# Patient Record
Sex: Female | Born: 1955 | Race: White | Hispanic: No | Marital: Married | Smoking: Former smoker
Health system: Southern US, Community
[De-identification: ages and names within clinical notes are randomized; demographics above are authoritative.]

## PROBLEM LIST (undated history)

## (undated) DIAGNOSIS — I10 Essential (primary) hypertension: Secondary | ICD-10-CM

## (undated) DIAGNOSIS — Z789 Other specified health status: Secondary | ICD-10-CM

## (undated) DIAGNOSIS — J40 Bronchitis, not specified as acute or chronic: Secondary | ICD-10-CM

## (undated) DIAGNOSIS — G629 Polyneuropathy, unspecified: Secondary | ICD-10-CM

## (undated) DIAGNOSIS — I5021 Acute systolic (congestive) heart failure: Secondary | ICD-10-CM

## (undated) DIAGNOSIS — K219 Gastro-esophageal reflux disease without esophagitis: Secondary | ICD-10-CM

## (undated) DIAGNOSIS — J45909 Unspecified asthma, uncomplicated: Secondary | ICD-10-CM

## (undated) DIAGNOSIS — I252 Old myocardial infarction: Secondary | ICD-10-CM

## (undated) DIAGNOSIS — I219 Acute myocardial infarction, unspecified: Secondary | ICD-10-CM

## (undated) DIAGNOSIS — I5022 Chronic systolic (congestive) heart failure: Secondary | ICD-10-CM

## (undated) DIAGNOSIS — C55 Malignant neoplasm of uterus, part unspecified: Secondary | ICD-10-CM

## (undated) DIAGNOSIS — T7840XA Allergy, unspecified, initial encounter: Secondary | ICD-10-CM

## (undated) DIAGNOSIS — M25512 Pain in left shoulder: Secondary | ICD-10-CM

## (undated) DIAGNOSIS — E669 Obesity, unspecified: Secondary | ICD-10-CM

## (undated) DIAGNOSIS — D472 Monoclonal gammopathy: Secondary | ICD-10-CM

## (undated) DIAGNOSIS — E785 Hyperlipidemia, unspecified: Secondary | ICD-10-CM

## (undated) DIAGNOSIS — I340 Nonrheumatic mitral (valve) insufficiency: Secondary | ICD-10-CM

## (undated) DIAGNOSIS — Z923 Personal history of irradiation: Secondary | ICD-10-CM

## (undated) DIAGNOSIS — R030 Elevated blood-pressure reading, without diagnosis of hypertension: Secondary | ICD-10-CM

## (undated) DIAGNOSIS — G4730 Sleep apnea, unspecified: Secondary | ICD-10-CM

## (undated) DIAGNOSIS — I2510 Atherosclerotic heart disease of native coronary artery without angina pectoris: Secondary | ICD-10-CM

## (undated) DIAGNOSIS — E039 Hypothyroidism, unspecified: Secondary | ICD-10-CM

## (undated) DIAGNOSIS — M25511 Pain in right shoulder: Secondary | ICD-10-CM

## (undated) HISTORY — DX: Obesity, unspecified: E66.9

## (undated) HISTORY — DX: Sleep apnea, unspecified: G47.30

## (undated) HISTORY — DX: Nonrheumatic mitral (valve) insufficiency: I34.0

## (undated) HISTORY — DX: Elevated blood-pressure reading, without diagnosis of hypertension: R03.0

## (undated) HISTORY — DX: Chronic systolic (congestive) heart failure: I50.22

## (undated) HISTORY — DX: Personal history of irradiation: Z92.3

## (undated) HISTORY — DX: Other specified health status: Z78.9

## (undated) HISTORY — DX: Acute systolic (congestive) heart failure: I50.21

## (undated) HISTORY — DX: Pain in right shoulder: M25.511

## (undated) HISTORY — DX: Pain in left shoulder: M25.512

## (undated) HISTORY — PX: TUBAL LIGATION: SHX77

## (undated) HISTORY — DX: Old myocardial infarction: I25.2

## (undated) HISTORY — DX: Atherosclerotic heart disease of native coronary artery without angina pectoris: I25.10

## (undated) HISTORY — DX: Unspecified asthma, uncomplicated: J45.909

## (undated) HISTORY — DX: Polyneuropathy, unspecified: G62.9

## (undated) HISTORY — DX: Hyperlipidemia, unspecified: E78.5

## (undated) HISTORY — DX: Hypothyroidism, unspecified: E03.9

## (undated) HISTORY — DX: Monoclonal gammopathy: D47.2

## (undated) HISTORY — DX: Essential (primary) hypertension: I10

## (undated) HISTORY — DX: Allergy, unspecified, initial encounter: T78.40XA

---

## 2000-11-27 ENCOUNTER — Encounter: Admission: RE | Admit: 2000-11-27 | Discharge: 2000-11-27 | Payer: Self-pay

## 2000-11-27 ENCOUNTER — Encounter: Payer: Self-pay | Admitting: Family Medicine

## 2001-11-13 ENCOUNTER — Emergency Department (HOSPITAL_COMMUNITY): Admission: EM | Admit: 2001-11-13 | Discharge: 2001-11-13 | Payer: Self-pay | Admitting: Emergency Medicine

## 2001-12-02 ENCOUNTER — Encounter: Admission: RE | Admit: 2001-12-02 | Discharge: 2001-12-02 | Payer: Self-pay | Admitting: Family Medicine

## 2002-06-22 ENCOUNTER — Emergency Department (HOSPITAL_COMMUNITY): Admission: EM | Admit: 2002-06-22 | Discharge: 2002-06-22 | Payer: Self-pay | Admitting: Emergency Medicine

## 2003-03-09 ENCOUNTER — Ambulatory Visit (HOSPITAL_COMMUNITY): Admission: RE | Admit: 2003-03-09 | Discharge: 2003-03-09 | Payer: Self-pay | Admitting: Family Medicine

## 2003-03-09 ENCOUNTER — Encounter: Payer: Self-pay | Admitting: Family Medicine

## 2004-02-23 ENCOUNTER — Encounter: Admission: RE | Admit: 2004-02-23 | Discharge: 2004-02-23 | Payer: Self-pay | Admitting: General Surgery

## 2004-08-16 ENCOUNTER — Inpatient Hospital Stay (HOSPITAL_COMMUNITY): Admission: EM | Admit: 2004-08-16 | Discharge: 2004-08-20 | Payer: Self-pay | Admitting: Emergency Medicine

## 2004-08-16 ENCOUNTER — Ambulatory Visit: Payer: Self-pay | Admitting: Orthopedic Surgery

## 2004-09-14 ENCOUNTER — Ambulatory Visit: Payer: Self-pay | Admitting: Orthopedic Surgery

## 2004-10-05 ENCOUNTER — Ambulatory Visit: Payer: Self-pay | Admitting: Orthopedic Surgery

## 2005-06-20 ENCOUNTER — Encounter: Admission: RE | Admit: 2005-06-20 | Discharge: 2005-06-20 | Payer: Self-pay | Admitting: General Surgery

## 2005-08-25 ENCOUNTER — Encounter: Payer: Self-pay | Admitting: Internal Medicine

## 2005-08-25 LAB — CONVERTED CEMR LAB

## 2005-10-04 ENCOUNTER — Ambulatory Visit (HOSPITAL_COMMUNITY): Admission: RE | Admit: 2005-10-04 | Discharge: 2005-10-04 | Payer: Self-pay | Admitting: Family Medicine

## 2005-10-24 ENCOUNTER — Encounter: Admission: RE | Admit: 2005-10-24 | Discharge: 2005-10-24 | Payer: Self-pay | Admitting: Family Medicine

## 2006-03-04 ENCOUNTER — Emergency Department (HOSPITAL_COMMUNITY): Admission: EM | Admit: 2006-03-04 | Discharge: 2006-03-04 | Payer: Self-pay | Admitting: Family Medicine

## 2006-05-18 ENCOUNTER — Encounter: Admission: RE | Admit: 2006-05-18 | Discharge: 2006-05-18 | Payer: Self-pay | Admitting: Family Medicine

## 2006-07-11 ENCOUNTER — Emergency Department (HOSPITAL_COMMUNITY): Admission: EM | Admit: 2006-07-11 | Discharge: 2006-07-11 | Payer: Self-pay | Admitting: Family Medicine

## 2006-08-15 ENCOUNTER — Emergency Department (HOSPITAL_COMMUNITY): Admission: EM | Admit: 2006-08-15 | Discharge: 2006-08-15 | Payer: Self-pay | Admitting: Family Medicine

## 2006-10-04 ENCOUNTER — Ambulatory Visit: Payer: Self-pay | Admitting: Internal Medicine

## 2006-10-23 ENCOUNTER — Emergency Department (HOSPITAL_COMMUNITY): Admission: EM | Admit: 2006-10-23 | Discharge: 2006-10-23 | Payer: Self-pay | Admitting: Family Medicine

## 2006-10-30 ENCOUNTER — Encounter: Payer: Self-pay | Admitting: Internal Medicine

## 2006-10-30 LAB — CONVERTED CEMR LAB
ALT: 26 units/L (ref 0–53)
AST: 19 units/L (ref 0–37)
BUN: 13 mg/dL (ref 6–23)
Basophils Absolute: 0 10*3/uL (ref 0.0–0.1)
Basophils Relative: 0 % (ref 0–1)
CO2: 22 meq/L (ref 19–32)
Calcium: 9 mg/dL (ref 8.4–10.5)
Chloride: 98 meq/L (ref 96–112)
Cholesterol: 237 mg/dL — ABNORMAL HIGH (ref 0–200)
Creatinine, Ser: 0.64 mg/dL (ref 0.40–1.50)
Eosinophils Absolute: 0.4 10*3/uL (ref 0.0–0.7)
Eosinophils Relative: 5 % (ref 0–5)
Glucose, Bld: 231 mg/dL — ABNORMAL HIGH (ref 70–99)
HCT: 45.2 % (ref 39.0–52.0)
HDL: 48 mg/dL (ref >39)
Hemoglobin: 14 g/dL (ref 13.0–17.0)
Hgb A1c MFr Bld: 10.5 % — ABNORMAL HIGH (ref 4.6–6.1)
LDL Cholesterol: 139 mg/dL — ABNORMAL HIGH (ref 0–99)
Lymphocytes Relative: 37 % (ref 12–46)
Lymphs Abs: 3 10*3/uL (ref 0.7–3.3)
MCHC: 31 g/dL (ref 30.0–36.0)
MCV: 88.3 fL (ref 78.0–100.0)
Microalb, Ur: 0.7 mg/dL (ref 0.00–1.89)
Monocytes Absolute: 0.5 10*3/uL (ref 0.2–0.7)
Monocytes Relative: 6 % (ref 3–11)
Neutro Abs: 4.2 10*3/uL (ref 1.7–7.7)
Neutrophils Relative %: 52 % (ref 43–77)
Platelets: 338 10*3/uL (ref 150–400)
Potassium: 5.2 meq/L (ref 3.5–5.3)
RBC: 5.12 M/uL (ref 4.22–5.81)
RDW: 14.1 % — ABNORMAL HIGH (ref 11.5–14.0)
Sodium: 136 meq/L (ref 135–145)
TSH: 2.104 microintl units/mL (ref 0.350–5.50)
Total CHOL/HDL Ratio: 4.9
Triglycerides: 248 mg/dL — ABNORMAL HIGH (ref <150)
VLDL: 50 mg/dL — ABNORMAL HIGH (ref 0–40)
WBC: 8 10*3/uL (ref 4.0–10.5)

## 2006-11-01 ENCOUNTER — Ambulatory Visit: Payer: Self-pay | Admitting: Internal Medicine

## 2006-12-07 ENCOUNTER — Ambulatory Visit: Payer: Self-pay | Admitting: Internal Medicine

## 2007-01-15 ENCOUNTER — Ambulatory Visit: Payer: Self-pay | Admitting: Internal Medicine

## 2007-03-26 ENCOUNTER — Encounter: Payer: Self-pay | Admitting: Internal Medicine

## 2007-03-26 DIAGNOSIS — I10 Essential (primary) hypertension: Secondary | ICD-10-CM | POA: Insufficient documentation

## 2007-03-26 DIAGNOSIS — I152 Hypertension secondary to endocrine disorders: Secondary | ICD-10-CM | POA: Insufficient documentation

## 2007-03-26 DIAGNOSIS — E1165 Type 2 diabetes mellitus with hyperglycemia: Secondary | ICD-10-CM

## 2007-03-26 DIAGNOSIS — E66813 Obesity, class 3: Secondary | ICD-10-CM | POA: Insufficient documentation

## 2007-03-26 DIAGNOSIS — E1159 Type 2 diabetes mellitus with other circulatory complications: Secondary | ICD-10-CM | POA: Insufficient documentation

## 2007-03-26 DIAGNOSIS — E785 Hyperlipidemia, unspecified: Secondary | ICD-10-CM

## 2007-03-26 DIAGNOSIS — E1169 Type 2 diabetes mellitus with other specified complication: Secondary | ICD-10-CM | POA: Insufficient documentation

## 2007-10-17 ENCOUNTER — Ambulatory Visit (HOSPITAL_COMMUNITY): Admission: RE | Admit: 2007-10-17 | Discharge: 2007-10-17 | Payer: Self-pay | Admitting: Gastroenterology

## 2007-11-15 ENCOUNTER — Emergency Department (HOSPITAL_COMMUNITY): Admission: EM | Admit: 2007-11-15 | Discharge: 2007-11-15 | Payer: Self-pay | Admitting: Emergency Medicine

## 2008-01-04 ENCOUNTER — Emergency Department (HOSPITAL_COMMUNITY): Admission: EM | Admit: 2008-01-04 | Discharge: 2008-01-04 | Payer: Self-pay | Admitting: Emergency Medicine

## 2008-05-25 ENCOUNTER — Emergency Department (HOSPITAL_COMMUNITY): Admission: EM | Admit: 2008-05-25 | Discharge: 2008-05-25 | Payer: Self-pay | Admitting: Family Medicine

## 2008-09-23 ENCOUNTER — Encounter: Admission: RE | Admit: 2008-09-23 | Discharge: 2008-09-23 | Payer: Self-pay | Admitting: Family Medicine

## 2008-09-28 ENCOUNTER — Ambulatory Visit (HOSPITAL_COMMUNITY): Admission: RE | Admit: 2008-09-28 | Discharge: 2008-09-28 | Payer: Self-pay | Admitting: Emergency Medicine

## 2008-09-28 ENCOUNTER — Emergency Department (HOSPITAL_COMMUNITY): Admission: EM | Admit: 2008-09-28 | Discharge: 2008-09-28 | Payer: Self-pay | Admitting: Emergency Medicine

## 2008-09-28 ENCOUNTER — Encounter (INDEPENDENT_AMBULATORY_CARE_PROVIDER_SITE_OTHER): Payer: Self-pay | Admitting: Emergency Medicine

## 2008-09-28 ENCOUNTER — Ambulatory Visit: Payer: Self-pay | Admitting: Vascular Surgery

## 2008-11-13 ENCOUNTER — Ambulatory Visit: Payer: Self-pay | Admitting: Interventional Radiology

## 2008-11-13 ENCOUNTER — Emergency Department (HOSPITAL_BASED_OUTPATIENT_CLINIC_OR_DEPARTMENT_OTHER): Admission: EM | Admit: 2008-11-13 | Discharge: 2008-11-13 | Payer: Self-pay | Admitting: Emergency Medicine

## 2008-11-23 ENCOUNTER — Encounter: Payer: Self-pay | Admitting: Family Medicine

## 2008-11-23 ENCOUNTER — Ambulatory Visit (HOSPITAL_COMMUNITY): Admission: RE | Admit: 2008-11-23 | Discharge: 2008-11-23 | Payer: Self-pay | Admitting: Family Medicine

## 2009-02-01 ENCOUNTER — Emergency Department (HOSPITAL_COMMUNITY): Admission: EM | Admit: 2009-02-01 | Discharge: 2009-02-01 | Payer: Self-pay | Admitting: Family Medicine

## 2009-10-06 ENCOUNTER — Encounter: Admission: RE | Admit: 2009-10-06 | Discharge: 2009-10-06 | Payer: Self-pay | Admitting: Family Medicine

## 2010-02-08 ENCOUNTER — Emergency Department (HOSPITAL_COMMUNITY): Admission: EM | Admit: 2010-02-08 | Discharge: 2010-02-08 | Payer: Self-pay | Admitting: Emergency Medicine

## 2010-02-08 ENCOUNTER — Emergency Department (HOSPITAL_COMMUNITY): Admission: EM | Admit: 2010-02-08 | Discharge: 2010-02-09 | Payer: Self-pay | Admitting: Emergency Medicine

## 2010-06-21 ENCOUNTER — Ambulatory Visit: Payer: Self-pay | Admitting: Family Medicine

## 2010-09-18 ENCOUNTER — Encounter: Payer: Self-pay | Admitting: General Surgery

## 2010-11-13 LAB — PROCALCITONIN: Procalcitonin: <0.5 ng/mL

## 2010-11-13 LAB — BASIC METABOLIC PANEL
BUN: 11 mg/dL (ref 6–23)
CO2: 24 mEq/L (ref 19–32)
Calcium: 9.5 mg/dL (ref 8.4–10.5)
Chloride: 99 mEq/L (ref 96–112)
Creatinine, Ser: 0.78 mg/dL (ref 0.4–1.2)
GFR calc Af Amer: >60 mL/min (ref >60)
GFR calc non Af Amer: >60 mL/min (ref >60)
Glucose, Bld: 284 mg/dL — ABNORMAL HIGH (ref 70–99)
Potassium: 4.3 mEq/L (ref 3.5–5.1)
Sodium: 134 mEq/L — ABNORMAL LOW (ref 135–145)

## 2010-11-13 LAB — LACTIC ACID, PLASMA: Lactic Acid, Venous: 2.9 mmol/L — ABNORMAL HIGH (ref 0.5–2.2)

## 2010-11-13 LAB — DIFFERENTIAL
Basophils Absolute: 0.1 10*3/uL (ref 0.0–0.1)
Basophils Relative: 1 % (ref 0–1)
Eosinophils Absolute: 0 10*3/uL (ref 0.0–0.7)
Eosinophils Relative: 0 % (ref 0–5)
Lymphocytes Relative: 12 % (ref 12–46)
Lymphs Abs: 1.2 10*3/uL (ref 0.7–4.0)
Monocytes Absolute: 0.7 10*3/uL (ref 0.1–1.0)
Monocytes Relative: 7 % (ref 3–12)
Neutro Abs: 7.8 10*3/uL — ABNORMAL HIGH (ref 1.7–7.7)
Neutrophils Relative %: 80 % — ABNORMAL HIGH (ref 43–77)

## 2010-11-13 LAB — CBC
HCT: 44.2 % (ref 36.0–46.0)
Hemoglobin: 14.4 g/dL (ref 12.0–15.0)
MCHC: 32.7 g/dL (ref 30.0–36.0)
MCV: 85.2 fL (ref 78.0–100.0)
Platelets: 291 10*3/uL (ref 150–400)
RBC: 5.19 MIL/uL — ABNORMAL HIGH (ref 3.87–5.11)
RDW: 14 % (ref 11.5–15.5)
WBC: 9.8 10*3/uL (ref 4.0–10.5)

## 2010-11-13 LAB — POCT CARDIAC MARKERS
CKMB, poc: 1.8 ng/mL (ref 1.0–8.0)
Myoglobin, poc: 386 ng/mL (ref 12–200)
Troponin i, poc: <0.05 ng/mL (ref 0.00–0.09)

## 2010-11-14 LAB — GLUCOSE, CAPILLARY

## 2010-11-17 ENCOUNTER — Inpatient Hospital Stay (INDEPENDENT_AMBULATORY_CARE_PROVIDER_SITE_OTHER)
Admission: RE | Admit: 2010-11-17 | Discharge: 2010-11-17 | Disposition: A | Discharge status: Home / Self Care | Payer: 59 | Source: Ambulatory Visit | Attending: Family Medicine | Admitting: Family Medicine

## 2010-11-17 DIAGNOSIS — J029 Acute pharyngitis, unspecified: Secondary | ICD-10-CM

## 2010-12-08 LAB — D-DIMER, QUANTITATIVE: D-Dimer, Quant: 0.7 ug/mL-FEU — ABNORMAL HIGH (ref 0.00–0.48)

## 2010-12-08 LAB — DIFFERENTIAL
Basophils Absolute: 0 10*3/uL (ref 0.0–0.1)
Eosinophils Relative: 6 % — ABNORMAL HIGH (ref 0–5)
Lymphocytes Relative: 32 % (ref 12–46)
Neutro Abs: 4.7 10*3/uL (ref 1.7–7.7)
Neutrophils Relative %: 57 % (ref 43–77)

## 2010-12-08 LAB — URINALYSIS, ROUTINE W REFLEX MICROSCOPIC
Bilirubin Urine: NEGATIVE
Glucose, UA: NEGATIVE mg/dL
Hgb urine dipstick: NEGATIVE
Specific Gravity, Urine: 1.03 (ref 1.005–1.030)

## 2010-12-08 LAB — COMPREHENSIVE METABOLIC PANEL
BUN: 15 mg/dL (ref 6–23)
CO2: 28 mEq/L (ref 19–32)
Chloride: 102 mEq/L (ref 96–112)
Creatinine, Ser: 0.7 mg/dL (ref 0.4–1.2)
GFR calc non Af Amer: >60 mL/min (ref >60)
Glucose, Bld: 181 mg/dL — ABNORMAL HIGH (ref 70–99)
Total Bilirubin: 0.4 mg/dL (ref 0.3–1.2)

## 2010-12-08 LAB — CBC
HCT: 39.8 % (ref 36.0–46.0)
MCV: 83.8 fL (ref 78.0–100.0)
RBC: 4.75 MIL/uL (ref 3.87–5.11)
WBC: 8 10*3/uL (ref 4.0–10.5)

## 2010-12-08 LAB — POCT CARDIAC MARKERS
Myoglobin, poc: 39.7 ng/mL (ref 12–200)
Myoglobin, poc: 53.7 ng/mL (ref 12–200)

## 2011-01-13 NOTE — Assessment & Plan Note (Signed)
[facility]                           PRIMARY CARE OFFICE NOTE   NAME:MARUTDeshunda, Thackston                    MRN:          [id number]  DATE:10/04/2006                            DOB:          09/07/1955    CHIEF COMPLAINT:  New patient to the practice.   HISTORY OF PRESENT ILLNESS:  The patient is a 55 year old white female  who is here to establish primary care. She was followed at [facility].   PAST MEDICAL HISTORY:  She has a history of  Type 2 diabetes. She has been on oral agents for 8  to 10 years. She states that her blood sugars are fairly well-  controlled. Her last hemoglobin A1c performed one year ago was  approximately 7 to 8. She has struggled with morbid obesity for most of  her life. She has been within 340 to 350 pounds over the last ten years.   She has tried her best to exercise on a regular basis, but since her  father has had open heart surgery, has not exercised in the last 4 to 5  months.  She also has a history of elevated lipids and was tried on several  agents including Lipitor, Zocor, and Zetia. LIPITOR CAUSED LEG CRAMPS,  ZETIA CAUSED GI UPSET, ZOCOR-REACTION UNKNOWN.   She takes lisinopril for her blood pressure. She seems to be very  compliant with her medications. Does not usually miss doses and her  blood pressure has been very well-controlled. She does admit to snoring  at night and has some issues with daytime somnolence. She has never had  sleep study.   CURRENT MEDICATIONS:  1. Metformin 1000 mg p.o. b.i.d.  2. Lisinopril 10 mg once a day.   ALLERGIES TO MEDICATIONS:  None known.   SOCIAL HISTORY:  The patient is married. Works as an Scientist, physiological at [employer]. She has three children. One natural daughter and two kids  from her second marriage.   FAMILY HISTORY:  Mother deceased. She was diabetic and had atrial  fibrillation and mitral valve repair secondary to rheumatic fever.  Father is alive. Has severe coronary artery disease, status post AAA  repair. No report of any cancer in the family.   HABITS:  Occasional beer, remote history of tobacco use. Quit in 1991.  She smoked for approximately 15 years one pack per day.   REVIEW OF SYSTEMS:  No HEENT symptoms. She denies any chest discomfort  or heaviness with exercise. No dyspnea on exertion. Occasional  heartburn. No nausea, vomiting, diarrhea or constipation. She did have a  colonoscopy in 2006. She denies dysuria, frequency, urgency. All other  systems negative.   PHYSICAL EXAMINATION:  VITAL SIGNS: Height is 5 feet, 7 inches. Weight  is 348 pounds. Temperature 97.0. Pulse 100. Blood pressure 128/84 in the  left arm in the seated position.  In general, the patient is a very pleasant morbidly obese 55 year old  white female in no apparent distress.  HEENT: Normocephalic, atraumatic. Pupils are equal and reactive to light  bilaterally. Extra-ocular muscles were intact. The patient  was  anicteric. Conjunctivae was within normal limits. External auditory  canals and tympanic membranes are clear bilaterally. Hearing was grossly  normal. Oropharyngeal examination was unremarkable.  NECK: Was thick. Positive acanthosis nigricans. No carotid bruits or  thyromegaly.  CHEST: Normal respiratory effort. Clear to auscultation bilaterally. No  rhonchi, rales or wheezing.  CARDIOVASCULAR: Regular rate and rhythm. No significant murmurs, rubs or  gallops appreciated.  ABDOMEN: Obese, but nontender. Unable to appreciate any organomegaly.  MUSCULOSKELETAL: Trace edema bilaterally. Intact pedis dorsalis pulses.  Normal sensation to temperature and vibration.  NEUROLOGIC: Cranial nerves II-XII was grossly intact. She was nonfocal.   IMPRESSION/RECOMMENDATION:  1. Type 2 diabetes and morbid obesity.  2. Hypertension, controlled.  3. History of hyperlipidemia with INTOLERANCE TO LIPITOR.  4. Health maintenance.    RECOMMENDATIONS:  Appears her hemoglobin A1c is suboptimal. This will be  repeated. However, she will be started on Byetta 5 mcg subcutaneous  b.i.d. We did not have a sample available today. She will return to our  office for injection teaching.   We discussed possible screening for obstructive sleep apnea. This will  be further discussed at followup visit. We established a weight loss  goal of approximately 10% of her body within the next 6 month time.   Followup time is 4 weeks.     Barbette Hair. Artist Pais, DO  Electronically Signed    RDY/MedQ  DD: 10/04/2006  DT: 10/04/2006  Job #: [id number]
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
## 2011-02-24 ENCOUNTER — Inpatient Hospital Stay (INDEPENDENT_AMBULATORY_CARE_PROVIDER_SITE_OTHER)
Admission: RE | Admit: 2011-02-24 | Discharge: 2011-02-24 | Disposition: A | Discharge status: Home / Self Care | Payer: 59 | Source: Ambulatory Visit | Attending: Family Medicine | Admitting: Family Medicine

## 2011-02-24 DIAGNOSIS — M719 Bursopathy, unspecified: Secondary | ICD-10-CM

## 2011-04-25 ENCOUNTER — Ambulatory Visit (INDEPENDENT_AMBULATORY_CARE_PROVIDER_SITE_OTHER): Payer: 59 | Admitting: Adult Health

## 2011-04-25 NOTE — Assessment & Plan Note (Signed)
Void error visit

## 2011-04-25 NOTE — Progress Notes (Signed)
  Subjective:    Patient ID: Terri Heath, female    DOB: 04-10-1956, 55 y.o.   MRN: [id number]  HPI Error visit    Review of Systems     Objective:   Physical Exam        Assessment & Plan:

## 2011-05-23 ENCOUNTER — Encounter: Payer: Self-pay | Admitting: Pulmonary Disease

## 2011-05-23 ENCOUNTER — Ambulatory Visit (INDEPENDENT_AMBULATORY_CARE_PROVIDER_SITE_OTHER): Payer: 59 | Admitting: Pulmonary Disease

## 2011-05-23 VITALS — BP 122/74 | HR 92 | Ht 67.0 in | Wt 364.0 lb

## 2011-05-23 DIAGNOSIS — G4733 Obstructive sleep apnea (adult) (pediatric): Secondary | ICD-10-CM

## 2011-05-23 DIAGNOSIS — Z23 Encounter for immunization: Secondary | ICD-10-CM

## 2011-05-23 NOTE — Progress Notes (Signed)
  Subjective:    Patient ID: Terri Heath, female    DOB: 14-Feb-1956, 55 y.o.   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 She reports occasional naps.She is an insulin dependent diabetic for 6 years.  PSG 5/09 (335 lbs) showed AHI 26/h with severe in REM, moderate PLMs. She has gained about 30 pounds since the study. Bedtime is 10 PM, she sleeps on her side with 2 pillows, sleep latency is minimal, wakes up around 7:00 on week days and 5 AM on weekends when she works. Loud snoring and has been noted by her husband, this is occasionally woke her up from sleep. There is no history suggestive of cataplexy, sleep paralysis or parasomnias    Review of Systems  Constitutional: Positive for unexpected weight change. Negative for fever.  HENT: Positive for sneezing. Negative for ear pain, nosebleeds, congestion, sore throat, rhinorrhea, trouble swallowing, dental problem, postnasal drip and sinus pressure.   Eyes: Positive for redness and itching.  Respiratory: Positive for wheezing. Negative for cough, chest tightness and shortness of breath.   Cardiovascular: Positive for leg swelling. Negative for palpitations.  Gastrointestinal: Negative for nausea and vomiting.  Genitourinary: Negative for dysuria.  Musculoskeletal: Negative for joint swelling.  Skin: Negative for rash.  Neurological: Negative for headaches.  Hematological: Does not bruise/bleed easily.  Psychiatric/Behavioral: Positive for dysphoric mood. The patient is not nervous/anxious.        Objective:   Physical Exam  Gen. Pleasant, obese, in no distress, normal affect ENT - no lesions, no post nasal drip, class 2-3 airway Neck: No JVD, no thyromegaly, no carotid bruits Lungs: no use of accessory muscles, no dullness to percussion, decreased without rales or rhonchi  Cardiovascular: Rhythm regular, heart  sounds  normal, no murmurs or gallops, no peripheral edema Abdomen: soft and non-tender, no hepatosplenomegaly, BS normal. Musculoskeletal: No deformities, no cyanosis or clubbing Neuro:  alert, non focal, no tremors       Assessment & Plan:

## 2011-05-23 NOTE — Patient Instructions (Signed)
We will set you up with CPAP machine after the titration study

## 2011-05-24 NOTE — Assessment & Plan Note (Signed)
The pathophysiology of obstructive sleep apnea , it's cardiovascular consequences & modes of treatment including CPAP were discused with the patient in detail & they evidenced understanding.  CPAP titration study will be scheduled & CPAP initiated basd on results. Weight loss encouraged, compliance with goal of at least 4-6 hrs every night is the expectation. Advised against medications with sedative side effects Cautioned against driving when sleepy - understanding that sleepiness will vary on a day to day basis

## 2011-05-25 ENCOUNTER — Ambulatory Visit (HOSPITAL_BASED_OUTPATIENT_CLINIC_OR_DEPARTMENT_OTHER): Payer: 59 | Attending: Pulmonary Disease

## 2011-05-25 DIAGNOSIS — E119 Type 2 diabetes mellitus without complications: Secondary | ICD-10-CM | POA: Insufficient documentation

## 2011-05-25 DIAGNOSIS — G4733 Obstructive sleep apnea (adult) (pediatric): Secondary | ICD-10-CM | POA: Insufficient documentation

## 2011-05-31 ENCOUNTER — Telehealth: Payer: Self-pay | Admitting: Pulmonary Disease

## 2011-05-31 DIAGNOSIS — G4733 Obstructive sleep apnea (adult) (pediatric): Secondary | ICD-10-CM

## 2011-05-31 DIAGNOSIS — G4761 Periodic limb movement disorder: Secondary | ICD-10-CM

## 2011-05-31 NOTE — Procedures (Signed)
Terri Heath, Terri Heath             ACCOUNT NO.:  [id number]  MEDICAL RECORD NO.:  [id number]          PATIENT TYPE:  OUT  LOCATION:  SLEEP CENTER                 FACILITY:  [facility]  PHYSICIAN:  Oretha Milch, MD      DATE OF BIRTH:  May 18, 1956  DATE OF STUDY:  05/25/2011                           NOCTURNAL POLYSOMNOGRAM  REFERRING PHYSICIAN:  Oretha Milch, MD  INDICATION FOR THE STUDY:  [name] is a 55 year old woman with obstructive sleep apnea and diabetes.  Her baseline polysomnogram in May 2009 when she weighed 335 pounds showed an AHI of 26 events per hour with severe degree in REM sleep and moderate periodic leg movements.  This CPAP titration study was performed with a sleep technologist in attendance.  EEG, EOG, EMG, EKG and respiratory parameters were recorded.  Sleep stages arousals, limb movement and respiratory data were scored according to criteria laid out by the American Academy of Sleep Medicine.  At the time of this study, she weighed 364 pounds with a height of 5 feet 7 inches, BMI of 57 and neck size of 17 inches. Epworth sleepiness score 5.  SLEEP ARCHITECTURE:  Lights out was at 10:38 p.m., lights on was at 5:50 a.m., total sleep time was 364 minutes with a sleep period time of 420 minutes and sleep efficiency of 84%.  Sleep latency was 12 minutes. Latency to REM sleep was 62 minutes with a wake after sleep onset at 56 minutes.  Sleep stages of the percentage of total sleep time was N1 of 70%, N2 of 62%, N3 of 0%, and REM sleep 31% (112 minutes).  Supine REM sleep accounted for 78 minutes.  REM rebound was noted with longest period of REM sleep around 4:00 a.m.  RESPIRATORY DATA:  CPAP was initiated at 5 cm with a medium full-face mask.  CPAP was titrated due to respiratory events and snoring until an optimal pressure of 19 cm was achieved.  At a level of 15 cm for 91 minutes of sleep including 27 minutes of REM sleep 2 hypopneas were noted with a lowest  desaturation of 92% and at level of 19 cm for 29 minutes of non-REM sleep no events and no desaturations were noted. This appears to be the optimal level used during the study.  AROUSAL DATA:  There were 111 arousals with an arousal index of 18 events per hour.  Most of these were spontaneous.  LIMB MOVEMENT DATA/:  Periodic limb movement index was 23 events per hour with PLM arousal index was only 2.1 events per hour.  OXYGEN DESATURATION DATA:  The desaturation index was 6 events prior, she spent 0.9 minutes with a saturation less than 88%.  CARDIAC DATA:  The low heart rate was 54 beats per minute, the high heart rate recorded was an artifact.  No arrhythmias were noted.  DISCUSSION:  She was desensitized with a medium full-face mask, although events were obliterated at 12 cm, 19 cm was required to cut out snoring.  IMPRESSION: 1. Moderate obstructive sleep apnea with hypopneas causing sleep     fragmentation, oxygen desaturation. 2. This was corrected by CPAP of 19 cm with a medium full-face mask  and humidity. 3. Limb movements were noted but very few of these were associated     with arousals, the significance of this is unclear. 4. No evidence of cardiac arrhythmias or behavioral disturbance during     sleep.  RECOMMENDATIONS: 1. The treatment options for this degree of sleep disorder breathing     include CPAP and weight loss therapy, CPAP should be initiated with     a goal of 19 cm with a medium full-face mask.  Alternatively, auto     CPAP of 15-20 cm can be used for better tolerance until she is     adjusted to the CPAP machine. 2. She should be cautioned against medications with sedative side     effects. 3. She should be advised against driving when sleepy.     Oretha Milch, MD Electronically Signed    RVA/MEDQ  D:  05/31/2011 11:54:20  T:  05/31/2011 15:11:30  Job:  [id number]

## 2011-05-31 NOTE — Telephone Encounter (Signed)
Pl let her know Rx sent for autoCPAP until she can get used to this & changes will be made after reviewing download

## 2011-06-02 NOTE — Telephone Encounter (Signed)
I informed pt of RA's findings and recommendations. Pt verbalized understanding. Pt states she will receive on Monday at 10am.

## 2011-07-11 ENCOUNTER — Ambulatory Visit: Payer: 59 | Admitting: Pulmonary Disease

## 2011-07-16 ENCOUNTER — Telehealth: Payer: Self-pay | Admitting: Pulmonary Disease

## 2011-07-16 NOTE — Telephone Encounter (Signed)
Download 10/8 -07/03/11 >> good complaince GOod control of events on 12 cm OK to change to fixed pr, if she is agreeable

## 2011-07-17 NOTE — Telephone Encounter (Signed)
lmomtcb x1 

## 2011-07-18 ENCOUNTER — Ambulatory Visit (INDEPENDENT_AMBULATORY_CARE_PROVIDER_SITE_OTHER): Payer: 59 | Admitting: Pulmonary Disease

## 2011-07-18 ENCOUNTER — Encounter: Payer: Self-pay | Admitting: Pulmonary Disease

## 2011-07-18 DIAGNOSIS — G4733 Obstructive sleep apnea (adult) (pediatric): Secondary | ICD-10-CM

## 2011-07-18 NOTE — Assessment & Plan Note (Signed)
Change to fixed pr 12 cm Weight loss encouraged, compliance with goal of at least 4-6 hrs every night is the expectation. Advised against medications with sedative side effects Cautioned against driving when sleepy - understanding that sleepiness will vary on a day to day basis Discussed supplies, care of machine

## 2011-07-18 NOTE — Patient Instructions (Addendum)
Call me for snoring or increasing fatigue Change to fixed pressure 12 cm Work on your weight loss program now

## 2011-07-18 NOTE — Progress Notes (Signed)
  Subjective:    Patient ID: Terri Heath, female    DOB: March 17, 1956, 55 y.o.   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 She reports occasional naps.She is an insulin dependent diabetic.  PSG 5/09 (335 lbs) showed AHI 26/h with severe in REM, moderate PLMs. She has gained about 30 pounds since the study.  Bedtime is 10 PM, she sleeps on her side with 2 pillows, sleep latency is minimal, wakes up around 7:00 on week days and 5 AM on weekends when she works. Loud snoring and has been noted by her husband, this is occasionally woke her up from sleep. Download 10/8 -07/03/11 >> good complaince  GOod control of events on 12 cm    Review of Systems Patient denies significant dyspnea,cough, hemoptysis,  chest pain, palpitations, pedal edema, orthopnea, paroxysmal nocturnal dyspnea, lightheadedness, nausea, vomiting, abdominal or  leg pains      Objective:   Physical Exam  Gen. Pleasant, well-nourished, in no distress ENT - no lesions, no post nasal drip Neck: No JVD, no thyromegaly, no carotid bruits Lungs: no use of accessory muscles, no dullness to percussion, clear without rales or rhonchi  Cardiovascular: Rhythm regular, heart sounds  normal, no murmurs or gallops, no peripheral edema Musculoskeletal: No deformities, no cyanosis or clubbing        Assessment & Plan:

## 2011-07-18 NOTE — Telephone Encounter (Signed)
Discussed with RA today during follow up OV

## 2011-08-02 ENCOUNTER — Other Ambulatory Visit: Payer: Self-pay | Admitting: *Deleted

## 2011-08-02 DIAGNOSIS — G4733 Obstructive sleep apnea (adult) (pediatric): Secondary | ICD-10-CM

## 2011-08-07 ENCOUNTER — Ambulatory Visit (INDEPENDENT_AMBULATORY_CARE_PROVIDER_SITE_OTHER): Payer: 59 | Admitting: Family Medicine

## 2011-08-07 DIAGNOSIS — I10 Essential (primary) hypertension: Secondary | ICD-10-CM

## 2011-08-07 DIAGNOSIS — E109 Type 1 diabetes mellitus without complications: Secondary | ICD-10-CM

## 2011-08-07 DIAGNOSIS — E781 Pure hyperglyceridemia: Secondary | ICD-10-CM

## 2011-09-06 ENCOUNTER — Ambulatory Visit (INDEPENDENT_AMBULATORY_CARE_PROVIDER_SITE_OTHER): Payer: Commercial Managed Care - PPO | Admitting: Surgery

## 2011-09-06 ENCOUNTER — Encounter (INDEPENDENT_AMBULATORY_CARE_PROVIDER_SITE_OTHER): Payer: Self-pay | Admitting: Surgery

## 2011-09-06 NOTE — Progress Notes (Signed)
Re:   Terri Heath DOB:   Jan 05, 1956 MRN:   [id number]  ASSESSMENT AND PLAN: 1.  Morbid Obesity  Weight - 368, BMI - 57.8  She is interested in the RYGB.  She actually saw Dr. Johna Heath in 2005 for planned surgery, but had financial difficulties.  Per the 1991 NIH Consensus Statement, the patient is a candidate for bariatric surgery.  The patient attended our initial information session and reviewed the types of bariatric surgery.    The patient is interested in the Roux en Y Gastric Bypass.  I discussed with the patient the indications and risks of bariatric surgery.  The potential risks of surgery include, but are not limited to, bleeding, infection, leak from the bowel, DVT and PE, open surgery, long term nutrition consequences, and death.  The patient understands the importance of compliance and long term follow-up with our group after surgery.  From here we will obtain lab tests, x-rays, nutrition consult, and psych consult.  Our goal is for her to loose 30 pounds before surgery.  We talked that she is at the upper end of our weight limits for surgery.  2.  Diabetes mellitus - insulin dependent since 2007.  Managed through [facility]. 3.  Hyperlipidemia 5.  OSA - sees Dr. Elisabeth Heath.  Placed on CPAP in Oct, 2012, doing much better. 6.  Allergic asthma. 7.  Urinary incontinence.  Chief Complaint  Patient presents with  . Pre-op Exam    gastric bypass initial   REFERRING PHYSICIAN: Dois Heath., MD, MD  HISTORY OF PRESENT ILLNESS: Terri Heath is a 56 y.o. (DOB: 1956/03/05)  white female whose primary care physician is Terri Heath., MD, MD and comes to me today for bariatric surgery.  The patient has been to our information session. She actually was going through the process of having a Roux gastric bypass in 2005 by Dr. Johna Heath, but because of financial concerns could not have surgery. Her weight at that time was 351.  She has tried multiple diets including:  Weight Watchers, Curves, Everlean Patterson, walking book diets, food lovers diet, Dr. Janett Labella diet, and through Wyona Almas at [employer].  She has been diets pulses much is 30 pounds.  She has tried Fen/Phen in 1996 and she tried [place], which he did not tolerate. She has some personal issues with her father's death in the last year it may have exacerbated her weight problems. However Dr. Johna Heath notes from 2005 indicated weight is only about 10 pounds different.  No history of stomach disease.  No history of liver disease.  No history of gall bladder disease.  No history of pancreas disease.  No history of colon disease.  She had a colonoscopy about 3 years ago.   Past Medical History  Diagnosis Date  . Diabetes mellitus   . Obesity   . Borderline hypertension   . Hyperlipidemia   . Bilateral shoulder pain       Past Surgical History  Procedure Date  . Cesarean section     x 3      Current Outpatient Prescriptions  Medication Sig Dispense Refill  . acetaminophen (TYLENOL) 500 MG tablet Take 500 mg by mouth every 6 (six) hours as needed.        Marland Kitchen albuterol (PROAIR HFA) 108 (90 BASE) MCG/ACT inhaler Inhale 2 puffs into the lungs every 6 (six) hours as needed.        . Fluticasone-Salmeterol (ADVAIR) 250-50 MCG/DOSE AEPB Inhale 1 puff  into the lungs every 12 (twelve) hours as needed.        . insulin glargine (LANTUS) 100 UNIT/ML injection Inject 40 Units into the skin 2 (two) times daily.        . Insulin Lispro, Human, (HUMALOG PEN ) Inject into the skin. Sliding scale       . lisinopril (PRINIVIL,ZESTRIL) 5 MG tablet Take 5 mg by mouth daily.        Marland Kitchen loratadine (CLARITIN) 10 MG tablet Take 10 mg by mouth daily.        . metFORMIN (GLUMETZA) 1000 MG (MOD) 24 hr tablet Take 1,000 mg by mouth 2 (two) times daily with a meal.        . naproxen sodium (ANAPROX) 220 MG tablet Take 440 mg by mouth daily.        . pravastatin (PRAVACHOL) 10 MG tablet Take 10 mg by mouth daily.             Allergies  Allergen Reactions  . Exenatide     REVIEW OF SYSTEMS: Skin:  No history of rash.  No history of abnormal moles. Infection:  No history of hepatitis or HIV.  No history of MRSA. Neurologic:  No history of stroke.  No history of seizure.  No history of headaches. Cardiac:  Hypertension. No history of heart disease.  No history of prior cardiac catheterization.  Pulmonary:  OSA/CPAP, sees Dr. Vassie Heath.  Has allergic asthma.  Endocrine:  Diabetes since 1996, on insulin since 2007. No thyroid disease. Gastrointestinal:  See HPI. Urologic:  No history of kidney stones.  No history of bladder infections. Musculoskeletal:  AA with pelvic fx in 2005.  She has recovered from this. Hematologic:  No bleeding disorder.  No history of anemia.  Not anticoagulated. Psycho-social:  The patient is oriented.   The patient has no obvious psychologic or social impairment to understanding our conversation and plan.  SOCIAL and FAMILY HISTORY: Married.  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  Her husband works for [employer]. Has three children:  33, 26, and 24.  PHYSICAL EXAM: BP 134/86  Pulse 110  Temp(Src) 96.9 F (36.1 C) (Temporal)  Ht 5\' 7"  (1.702 m)  Wt 368 lb 6.4 oz (167.105 kg)  BMI 57.70 kg/m2  SpO2 98%  General: Morbidly obese WF who is alert and generally healthy appearing.  HEENT: Normal. Pupils equal. Good dentition. Neck: Supple. No mass.  No thyroid mass.  Carotid pulse okay with no bruit. Lymph Nodes:  No supraclavicular or cervical nodes. Breasts:  Neg.  She gets annual mammograms. Lungs: Clear to auscultation and symmetric breath sounds. Heart:  RRR. No murmur or rub.  Abdomen: Soft. No mass. No tenderness. No hernia. Normal bowel sounds. She is more an apple than a pear in shape.  She has a dense lower midline incision from her C sections. Rectal: Not done. Extremities:  Good strength and ROM  in upper and lower extremities. Neurologic:  Grossly intact to motor  and sensory function. Psychiatric: Has normal mood and affect. Behavior is normal.   DATA REVIEWED: Old chart  Ovidio Kin, MD,  [facility] [address] [place]  [address]   [place], [place]    [address] Phone:  [phone] FAX:  [phone]

## 2011-09-13 ENCOUNTER — Other Ambulatory Visit (HOSPITAL_COMMUNITY): Payer: Self-pay

## 2011-09-18 ENCOUNTER — Encounter: Payer: Self-pay | Admitting: *Deleted

## 2011-09-18 ENCOUNTER — Other Ambulatory Visit: Payer: Self-pay

## 2011-09-18 ENCOUNTER — Ambulatory Visit (HOSPITAL_COMMUNITY)
Admission: RE | Admit: 2011-09-18 | Discharge: 2011-09-18 | Disposition: A | Discharge status: Home / Self Care | Payer: 59 | Source: Ambulatory Visit | Attending: Surgery | Admitting: Surgery

## 2011-09-18 ENCOUNTER — Other Ambulatory Visit (INDEPENDENT_AMBULATORY_CARE_PROVIDER_SITE_OTHER): Payer: Self-pay

## 2011-09-18 ENCOUNTER — Other Ambulatory Visit (INDEPENDENT_AMBULATORY_CARE_PROVIDER_SITE_OTHER): Payer: Self-pay | Admitting: Surgery

## 2011-09-18 ENCOUNTER — Encounter: Payer: 59 | Attending: Surgery | Admitting: *Deleted

## 2011-09-18 ENCOUNTER — Other Ambulatory Visit (HOSPITAL_COMMUNITY): Payer: 59

## 2011-09-18 ENCOUNTER — Encounter (HOSPITAL_COMMUNITY)
Admission: RE | Disposition: A | Discharge status: Home / Self Care | Payer: Self-pay | Source: Ambulatory Visit | Attending: Surgery

## 2011-09-18 DIAGNOSIS — Z01818 Encounter for other preprocedural examination: Secondary | ICD-10-CM | POA: Insufficient documentation

## 2011-09-18 DIAGNOSIS — Z01811 Encounter for preprocedural respiratory examination: Secondary | ICD-10-CM

## 2011-09-18 DIAGNOSIS — Z713 Dietary counseling and surveillance: Secondary | ICD-10-CM | POA: Insufficient documentation

## 2011-09-18 HISTORY — PX: BREATH TEK H PYLORI: SHX5422

## 2011-09-18 SURGERY — BREATH TEST, FOR HELICOBACTER PYLORI

## 2011-09-18 SURGERY — Surgical Case
Anesthesia: *Unknown

## 2011-09-18 NOTE — Progress Notes (Signed)
  Pre-Op Assessment Visit: Pre-Operative Gastric Bypass Surgery  Medical Nutrition Therapy:  Appt start time: 1500 end time:  1600.  Patient was seen on 09/18/2011 for Pre-Operative Gastric Bypass Nutrition Assessment. Assessment and letter of approval faxed to [facility] Bariatric Surgery Program coordinator on 09/18/2011.  Approval letter sent to [facility] center and will be available in the chart under the media tab.  Handouts given during visit include:  Pre-Op Goals Handout  Patient to call for Pre-Op and Post-Op Nutrition Education at [facility] when surgery is scheduled.

## 2011-09-18 NOTE — Patient Instructions (Signed)
   Follow Pre-Op Nutrition Goals to prepare for Gastric Bypass Surgery.   Call the [facility] at [phone] once you have been given your surgery date to enrolled in the Pre-Op Nutrition Class. You will need to attend this nutrition class 3-4 weeks prior to your surgery. 

## 2011-09-19 ENCOUNTER — Encounter (HOSPITAL_COMMUNITY): Payer: Self-pay | Admitting: Surgery

## 2011-09-28 ENCOUNTER — Encounter (INDEPENDENT_AMBULATORY_CARE_PROVIDER_SITE_OTHER): Payer: Self-pay

## 2011-09-29 ENCOUNTER — Telehealth: Payer: Self-pay

## 2011-09-29 NOTE — Telephone Encounter (Signed)
.  [facility] PT IS ASKING DR. Hal Hope TO CALL TO DISCUSS THE FINDINGS FROM DR. NEWMAN  PLEASE CALL

## 2011-10-04 NOTE — Telephone Encounter (Signed)
Left message with husband that I was returning patients phone call. Patient to call me back later today.

## 2011-10-04 NOTE — Telephone Encounter (Signed)
Reminder

## 2011-10-04 NOTE — Telephone Encounter (Signed)
Do we have an alternate phone # for this patient.  One on note routed me to a voice mail of another individual at [facility]. Thanks!

## 2011-10-18 ENCOUNTER — Telehealth: Payer: Self-pay

## 2011-10-18 NOTE — Telephone Encounter (Signed)
Pt has questions on testing prior to gastric bypass surgery.  Please call work before 7 and after her cell phone

## 2011-10-31 ENCOUNTER — Other Ambulatory Visit: Payer: Self-pay | Admitting: Family Medicine

## 2011-11-01 ENCOUNTER — Other Ambulatory Visit (INDEPENDENT_AMBULATORY_CARE_PROVIDER_SITE_OTHER): Payer: Self-pay | Admitting: Surgery

## 2011-11-28 ENCOUNTER — Ambulatory Visit (INDEPENDENT_AMBULATORY_CARE_PROVIDER_SITE_OTHER): Payer: 59 | Admitting: Family Medicine

## 2011-11-28 VITALS — BP 140/84 | HR 101 | Temp 97.5°F | Resp 18 | Ht 67.5 in | Wt 373.0 lb

## 2011-11-28 DIAGNOSIS — J069 Acute upper respiratory infection, unspecified: Secondary | ICD-10-CM

## 2011-11-28 DIAGNOSIS — R1084 Generalized abdominal pain: Secondary | ICD-10-CM

## 2011-11-28 DIAGNOSIS — J029 Acute pharyngitis, unspecified: Secondary | ICD-10-CM

## 2011-11-28 LAB — POCT RAPID STREP A (OFFICE): Rapid Strep A Screen: NEGATIVE

## 2011-11-28 MED ORDER — MOMETASONE FURO-FORMOTEROL FUM 200-5 MCG/ACT IN AERO: 2.0000 | INHALATION_SPRAY | Freq: Two times a day (BID) | RESPIRATORY_TRACT | Status: DC

## 2011-11-28 MED ORDER — MAGIC MOUTHWASH W/LIDOCAINE: 5.0000 mL | Freq: Four times a day (QID) | ORAL | Status: DC | PRN

## 2011-11-28 NOTE — Patient Instructions (Signed)

## 2011-11-28 NOTE — Progress Notes (Signed)
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 She describes as her throat as raw. Cough is nonproductive and she's had no fevers.  Objective: Morbidly obese woman with hoarseness and no acute distress  TMs normal  Oropharynx red uvula and mid pharynx  Neck supple, some fullness in the anterior cervical region  Chest: Few expiratory wheezes  Heart: Regular no murmur  Skin warm and dry  Assessment: Bronchitis with acute pharyngitis Results for orders placed in visit on 11/28/11  POCT RAPID STREP A (OFFICE)      Component Value Range   Rapid Strep A Screen Negative  Negative    A:  Acute uri  P:  MMW and ibuprofen Alara 202 puffs twice a day for the wheezing

## 2011-11-30 ENCOUNTER — Encounter: Payer: 59 | Attending: Surgery | Admitting: *Deleted

## 2011-11-30 DIAGNOSIS — Z01818 Encounter for other preprocedural examination: Secondary | ICD-10-CM | POA: Insufficient documentation

## 2011-11-30 DIAGNOSIS — Z713 Dietary counseling and surveillance: Secondary | ICD-10-CM | POA: Insufficient documentation

## 2011-11-30 NOTE — Patient Instructions (Signed)
Follow:   Pre-Op Diet per MD 2 weeks prior to surgery  Phase 2- Liquids (clear/full) 2 weeks after surgery  Vitamin/Mineral/Calcium guidelines for purchasing bariatric supplements  Exercise guidelines pre and post-op per MD  Follow-up at [facility] in 2 weeks post-op for diet advancement. Contact Adonias Demore as needed with questions/concerns. 

## 2011-11-30 NOTE — Progress Notes (Signed)
Bariatric Class:  Appt start time: 0830 end time:  0930.  Pre-Operative Nutrition Class  Patient was seen on 11/30/2011 for Pre-Operative Bariatric Surgery Education at the [facility].  Surgery date: TBD Surgery type: Gastric Bypass  Samples given per MNT protocol: Bariatric Advantage Multivitamin [facility]) Lot #  [id number] Exp: 09/13  Bariatric Advantage Calcium Citrate Lozenge (Chocolate) Lot # [id number] Exp: 09/13  Bariatric Advantage B-12 (Peppermint) Lot # [id number] Exp: 05/13  Celebrate Vitamins Calcium Citrate (Strawberry Creme) Lot # [id number] Exp: 04/13  Corliss Marcus Protein Powder (Chocolate Splendor) Lot # [id number] Exp: 05/14  The following the learning objective met by the patient during this course:  Identifies Pre-Op Dietary Goals and will begin 2 weeks pre-operatively  Identifies appropriate sources of fluids and proteins   States protein recommendations and appropriate sources pre and post-operatively  Identifies Post-Operative Dietary Goals and will follow for 2 weeks post-operatively  Identifies appropriate multivitamin and calcium sources  Describes the need for physical activity post-operatively and will follow MD recommendations  States when to call healthcare provider regarding medication questions or post-operative complications  Handouts given during class include:  Pre-Op Bariatric Surgery Diet Handout  Protein Shake Handout  Post-Op Bariatric Surgery Nutrition Handout  BELT Program Information Flyer  Support Group Information Flyer  Follow-Up Plan: Patient will follow-up at [facility] 2 weeks post operatively for diet advancement per MD.

## 2012-01-03 ENCOUNTER — Encounter (HOSPITAL_COMMUNITY): Payer: Self-pay | Admitting: Pharmacy Technician

## 2012-01-04 ENCOUNTER — Encounter (INDEPENDENT_AMBULATORY_CARE_PROVIDER_SITE_OTHER): Payer: Self-pay | Admitting: Surgery

## 2012-01-04 ENCOUNTER — Ambulatory Visit (INDEPENDENT_AMBULATORY_CARE_PROVIDER_SITE_OTHER): Payer: Commercial Managed Care - PPO | Admitting: Surgery

## 2012-01-04 NOTE — Progress Notes (Signed)
Re:   Terri Heath DOB:   [birth year] MRN:   [id number]  See my last note (09/06/2011) for complete eval. She has a goal to loose below 340 - she will call us when she hits that goal.  We will cancel her upcoming surgery.  ASSESSMENT AND PLAN: 1.  Morbid Obesity  Weight - 368, BMI - 57.8  She is interested in the RYGB.  She actually saw Dr. Johna Sheriff in 2005 for planned surgery, but had financial difficulties.  Per the 1991 NIH Consensus Statement, the patient is a candidate for bariatric surgery.  The patient attended our initial information session and reviewed the types of bariatric surgery.    The patient is interested in the Roux en Y Gastric Bypass.  I discussed with the patient the indications and risks of bariatric surgery.  The potential risks of surgery include, but are not limited to, bleeding, infection, leak from the bowel, DVT and PE, open surgery, long term nutrition consequences, and death.  The patient understands the importance of compliance and long term follow-up with our group after surgery.  Our goal is for her to loose below 340 pounds.  We talked that she is at the upper end of our weight limits for surgery.  Her husband is with her today.  2.  Diabetes mellitus - insulin dependent since 2007.  Managed through [facility]. 3.  Hyperlipidemia 5.  OSA - sees Dr. Elisabeth Cara.  Placed on CPAP in Oct, 2012, doing much better. 6.  Allergic asthma. 7.  Urinary incontinence.  Chief Complaint  Patient presents with  . Bariatric Pre-op   REFERRING PHYSICIAN: Dois Davenport., MD, MD  HISTORY OF PRESENT ILLNESS: Terri Heath is a 56 y.o. (DOB: Dec 21, 1955)  white female whose primary care physician is Dois Davenport., MD, MD and comes to me today for bariatric surgery.  We talked about weight loss goals.  She is on the pre op Renaldo Fiddler diet and doing well with this.  She will call us when she reaches her goal weight - <340.   Past Medical History  Diagnosis Date  .  Obesity   . Borderline hypertension   . Hyperlipidemia   . Bilateral shoulder pain   . Diabetes mellitus     Diagnosed in 2000      Past Surgical History  Procedure Date  . Cesarean section     x 3  . Breath tek h pylori 09/18/2011    Procedure: BREATH TEK H PYLORI;  Surgeon: Kandis Cocking, MD;  Location: [facility];  Service: General;  Laterality: N/A;      Current Outpatient Prescriptions  Medication Sig Dispense Refill  . acetaminophen (TYLENOL) 500 MG tablet Take 500 mg by mouth every 6 (six) hours as needed. For pain      . albuterol (PROAIR HFA) 108 (90 BASE) MCG/ACT inhaler Inhale 2 puffs into the lungs every 6 (six) hours as needed. wheezing      . aspirin 81 MG chewable tablet Chew 81 mg by mouth daily.      . Fluticasone-Salmeterol (ADVAIR) 250-50 MCG/DOSE AEPB Inhale 1 puff into the lungs every 12 (twelve) hours as needed. For allergy      . insulin glargine (LANTUS) 100 UNIT/ML injection Inject 45 Units into the skin 2 (two) times daily.       . Insulin Lispro, Human, (HUMALOG PEN Wilson) Inject 0-10 Units into the skin 3 (three) times daily. Sliding scale      .  lisinopril (PRINIVIL,ZESTRIL) 5 MG tablet Take 5 mg by mouth daily with breakfast.       . loratadine (CLARITIN) 10 MG tablet Take 10 mg by mouth daily with breakfast.       . metFORMIN (GLUMETZA) 1000 MG (MOD) 24 hr tablet Take 1,000 mg by mouth 2 (two) times daily with a meal.        . Multiple Vitamin (MULITIVITAMIN WITH MINERALS) TABS Take 1 tablet by mouth daily with breakfast.          Allergies  Allergen Reactions  . Exenatide   . Other Diarrhea    Kale=food  . Statins Other (See Comments)    Leg cramps    REVIEW OF SYSTEMS: Skin:  No history of rash.  No history of abnormal moles. Infection:  No history of hepatitis or HIV.  No history of MRSA. Neurologic:  No history of stroke.  No history of seizure.  No history of headaches. Cardiac:  Hypertension. No history of heart disease.  No history of  prior cardiac catheterization.  Pulmonary:  OSA/CPAP, sees Dr. Vassie Loll.  Has allergic asthma.  Endocrine:  Diabetes since 1996, on insulin since 2007. No thyroid disease. Gastrointestinal:  See HPI. Urologic:  No history of kidney stones.  No history of bladder infections. Musculoskeletal:  AA with pelvic fx in 2005.  She has recovered from this. Hematologic:  No bleeding disorder.  No history of anemia.  Not anticoagulated. Psycho-social:  The patient is oriented.   The patient has no obvious psychologic or social impairment to understanding our conversation and plan.  SOCIAL and FAMILY HISTORY: Married.  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  Her husband works for [employer]. Has three children:  33, 26, and 24.  PHYSICAL EXAM: BP 118/80  Pulse 78  Temp(Src) 97.7 F (36.5 C) (Temporal)  Resp 18  Ht 5\' 7"  (1.702 m)  Wt 362 lb (164.202 kg)  BMI 56.70 kg/m2  I did not do a physical exam today.  DATA REVIEWED: X-rays.  Terri Kin, MD,  [facility] [address]  [address]   [place], [place]    [address] Phone:  [phone] FAX:  [phone]

## 2012-01-09 ENCOUNTER — Inpatient Hospital Stay (HOSPITAL_COMMUNITY): Admission: RE | Admit: 2012-01-09 | Payer: 59 | Source: Ambulatory Visit

## 2012-01-16 ENCOUNTER — Encounter (HOSPITAL_COMMUNITY): Admission: RE | Payer: Self-pay | Source: Ambulatory Visit

## 2012-01-16 ENCOUNTER — Ambulatory Visit (HOSPITAL_COMMUNITY): Admission: RE | Admit: 2012-01-16 | Payer: 59 | Source: Ambulatory Visit | Admitting: Surgery

## 2012-01-16 SURGERY — LAPAROSCOPIC ROUX-EN-Y GASTRIC
Anesthesia: General

## 2012-02-07 ENCOUNTER — Ambulatory Visit (INDEPENDENT_AMBULATORY_CARE_PROVIDER_SITE_OTHER): Payer: Self-pay | Admitting: Surgery

## 2012-02-09 ENCOUNTER — Other Ambulatory Visit: Payer: Self-pay | Admitting: Physician Assistant

## 2012-02-09 MED ORDER — GLUCOSE BLOOD VI STRP: ORAL_STRIP | Status: AC

## 2012-05-02 ENCOUNTER — Encounter: Payer: 59 | Admitting: Internal Medicine

## 2012-05-02 ENCOUNTER — Encounter: Payer: Self-pay | Admitting: Family Medicine

## 2012-05-09 ENCOUNTER — Encounter: Payer: Self-pay | Admitting: Internal Medicine

## 2012-05-09 ENCOUNTER — Ambulatory Visit (INDEPENDENT_AMBULATORY_CARE_PROVIDER_SITE_OTHER): Payer: 59 | Admitting: Internal Medicine

## 2012-05-09 VITALS — BP 132/72 | HR 93 | Temp 97.2°F | Ht 67.0 in | Wt 370.0 lb

## 2012-05-09 DIAGNOSIS — Z23 Encounter for immunization: Secondary | ICD-10-CM

## 2012-05-09 DIAGNOSIS — E119 Type 2 diabetes mellitus without complications: Secondary | ICD-10-CM

## 2012-05-09 DIAGNOSIS — E785 Hyperlipidemia, unspecified: Secondary | ICD-10-CM

## 2012-05-09 DIAGNOSIS — G4733 Obstructive sleep apnea (adult) (pediatric): Secondary | ICD-10-CM

## 2012-05-09 DIAGNOSIS — R32 Unspecified urinary incontinence: Secondary | ICD-10-CM

## 2012-05-09 DIAGNOSIS — Z79899 Other long term (current) drug therapy: Secondary | ICD-10-CM

## 2012-05-09 DIAGNOSIS — Z1231 Encounter for screening mammogram for malignant neoplasm of breast: Secondary | ICD-10-CM

## 2012-05-09 LAB — COMPLETE METABOLIC PANEL WITH GFR
ALT: 19 U/L (ref 0–35)
CO2: 26 mEq/L (ref 19–32)
Calcium: 9.8 mg/dL (ref 8.4–10.5)
Chloride: 101 mEq/L (ref 96–112)
Creat: 0.62 mg/dL (ref 0.50–1.10)
GFR, Est African American: >89 mL/min
GFR, Est Non African American: >89 mL/min
Glucose, Bld: 86 mg/dL (ref 70–99)
Total Bilirubin: 0.3 mg/dL (ref 0.3–1.2)
Total Protein: 7.3 g/dL (ref 6.0–8.3)

## 2012-05-09 LAB — POCT GLYCOSYLATED HEMOGLOBIN (HGB A1C): Hemoglobin A1C: 7.8

## 2012-05-09 LAB — GLUCOSE, CAPILLARY: Glucose-Capillary: 104 mg/dL — ABNORMAL HIGH (ref 70–99)

## 2012-05-09 LAB — CBC WITH DIFFERENTIAL/PLATELET
Basophils Absolute: 0.1 10*3/uL (ref 0.0–0.1)
Eosinophils Absolute: 0.4 10*3/uL (ref 0.0–0.7)
Eosinophils Relative: 4 % (ref 0–5)
HCT: 39.2 % (ref 36.0–46.0)
Lymphocytes Relative: 36 % (ref 12–46)
MCH: 24.3 pg — ABNORMAL LOW (ref 26.0–34.0)
MCHC: 32.1 g/dL (ref 30.0–36.0)
MCV: 75.5 fL — ABNORMAL LOW (ref 78.0–100.0)
Monocytes Absolute: 0.6 10*3/uL (ref 0.1–1.0)
Platelets: 353 10*3/uL (ref 150–400)
RDW: 17.4 % — ABNORMAL HIGH (ref 11.5–15.5)

## 2012-05-09 LAB — LIPID PANEL
Cholesterol: 206 mg/dL — ABNORMAL HIGH (ref 0–200)
HDL: 59 mg/dL (ref >39)
Total CHOL/HDL Ratio: 3.5 Ratio
Triglycerides: 110 mg/dL (ref <150)

## 2012-05-09 MED ORDER — "NEEDLE (DISP) 30G X 1/2"" MISC": Status: DC

## 2012-05-09 MED ORDER — INSULIN GLARGINE 100 UNIT/ML ~~LOC~~ SOLN: 45.0000 [IU] | Freq: Two times a day (BID) | SUBCUTANEOUS | Status: DC

## 2012-05-09 NOTE — Addendum Note (Signed)
Addended by: Angelina Ok F on: 05/09/2012 01:55 PM   Modules accepted: Orders

## 2012-05-09 NOTE — Progress Notes (Signed)
  Subjective:    Patient ID: Barrie Dunker, female    DOB: 1956/07/12, 56 y.o.   MRN: [id number]  HPI 56 yr. Old WF with below stated medical history presents to establish care. Overall, feels well. She has some occasional Left shoulder pain, which is improved with swimming.  She has also had some "muscle pulls" that have improved. She denies CP, SOB, N/V/D/C, melena, hematochezia, dysuria. She admits to have an overactive bladder, but controls it with limiting fluids, she does not take medications and is not interested in treatment. She denies hypoglycemia. Highest BG was 220. Denies abdominal discomfort. She is still working on weight loss of at least 30 lbs, and she is working on this. If she is successful, she will have gastric bypass. She admits she has had a colonoscopy at the age of 56 y.o., [facility]. She states she's had a mammogram last year. PCP was Dr. Nadyne Coombes [facility]). PAP has not been done "in a long time". She has seen Dr. Vassie Loll (pulmonary) for OSA and got a CPAP mask and sleep study.   Review of Systems Complete 12 point review of systems is otherwise negative except for that stated in the HPI.    Objective:   Physical Exam GEN: AAOx3, NAD. HEENT:  EOMI, PERRLA, no icterus, no adenopathy, moist mucosa. CV: S1S2, no m/r/g, RRR. PULM: CTA bilat. ABD/GI: Obese, NT, no guarding, no distention, no guarding, no palpable masses. +BS LE/UE: small, 0.2 x 0.1 cm skin tear on left great toe, no ulcers, 2/4 pulses. Sensation to monofilament intact. NEURO: CN II-XII intact, no focal deficits.    Assessment & Plan:  56 yr. Old WF w/ pmhx significant for morbid obesity Body mass index is 57.95 kg/(m^2)., Type 2 IDDM, HL, OSA w/ CPAP at night, urinary incontinence, mild intermittent asthma, presents to establish care. 1) IDDM type 2: HbA1c is 7.8%. She admits to not following a diabetic diet.  Educated her, no changes in Lantus or meal time insulin for now. 2) Morbid  obesity: working on losing weight. Educated. 3) hx of HL: check lipid panel. She states she is intolerant of "ALL" statin drugs due to leg cramps. 4) OSA: On CPAP, stable. 5) Urinary incontinence: Does not want tx, states she is able to control with timed voiding and controlling fluid intake timing. 6) Mild-intermittent Asthma: states albuterol does not help, but current tx with Dulera does. No changes today. 7) Health Maintenance: Mammogram, Gyn referral for PAP, Colonoscopy per patient in past 5 years so I need records. States she's had pneumovax 5 years ago.   8) Return in three months.  Jonah Blue

## 2012-05-09 NOTE — Patient Instructions (Signed)
Please obtain lab work today. Sign for your records from your PCP. Follow up with Gyn for PAP. Obtain Mammogram. Follow up in 3 months.

## 2012-05-10 LAB — URINALYSIS, ROUTINE W REFLEX MICROSCOPIC
Bilirubin Urine: NEGATIVE
Ketones, ur: NEGATIVE mg/dL
Nitrite: NEGATIVE
Protein, ur: NEGATIVE mg/dL
Urobilinogen, UA: 0.2 mg/dL (ref 0.0–1.0)

## 2012-05-10 LAB — PROTEIN / CREATININE RATIO, URINE

## 2012-05-15 ENCOUNTER — Telehealth: Payer: Self-pay | Admitting: *Deleted

## 2012-05-15 DIAGNOSIS — E119 Type 2 diabetes mellitus without complications: Secondary | ICD-10-CM

## 2012-05-15 MED ORDER — INSULIN LISPRO 100 UNIT/ML ~~LOC~~ SOLN: SUBCUTANEOUS | Status: DC

## 2012-05-15 NOTE — Telephone Encounter (Signed)
Fax from [facility] - requesting refill on:  Humalog Kwik Pen 5x20ml Directions: inject per sliding scale   Last fill: 02/07/12   Qty: 30 Thanks

## 2012-05-27 ENCOUNTER — Telehealth: Payer: Self-pay | Admitting: *Deleted

## 2012-05-27 NOTE — Telephone Encounter (Signed)
Call from pt desires results of labs.  Results can be called to patient's home.  Angelina Ok, RN 05/27/2012 4:43 PM

## 2012-05-28 NOTE — Telephone Encounter (Signed)
Have her follow up with me. Her lipid results can be sent to her. She is intolerant of the class of medications we can use for treating cholesterol. We can discuss it in the next 3 months at follow up. Advise her to continue working on diet changes.

## 2012-06-06 ENCOUNTER — Ambulatory Visit: Payer: 59

## 2012-07-23 ENCOUNTER — Ambulatory Visit: Payer: 59

## 2012-07-30 ENCOUNTER — Other Ambulatory Visit: Payer: Self-pay | Admitting: Family Medicine

## 2012-07-30 ENCOUNTER — Ambulatory Visit: Payer: 59 | Admitting: Family Medicine

## 2012-08-02 ENCOUNTER — Telehealth: Payer: Self-pay | Admitting: *Deleted

## 2012-08-02 ENCOUNTER — Other Ambulatory Visit: Payer: Self-pay | Admitting: *Deleted

## 2012-08-02 DIAGNOSIS — G4733 Obstructive sleep apnea (adult) (pediatric): Secondary | ICD-10-CM

## 2012-08-02 DIAGNOSIS — E119 Type 2 diabetes mellitus without complications: Secondary | ICD-10-CM

## 2012-08-02 DIAGNOSIS — E785 Hyperlipidemia, unspecified: Secondary | ICD-10-CM

## 2012-08-02 DIAGNOSIS — Z79899 Other long term (current) drug therapy: Secondary | ICD-10-CM

## 2012-08-02 MED ORDER — GLUCOSE BLOOD VI STRP: ORAL_STRIP | Status: DC

## 2012-08-02 MED ORDER — METFORMIN HCL 1000 MG PO TABS
1000.0000 mg | ORAL_TABLET | Freq: Two times a day (BID) | ORAL | Status: DC
Start: 2012-08-02 — End: 2012-11-27

## 2012-08-02 MED ORDER — INSULIN GLARGINE 100 UNIT/ML ~~LOC~~ SOLN: 45.0000 [IU] | Freq: Two times a day (BID) | SUBCUTANEOUS | Status: DC

## 2012-08-02 MED ORDER — METFORMIN HCL ER (MOD) 1000 MG PO TB24: 1000.0000 mg | ORAL_TABLET | Freq: Two times a day (BID) | ORAL | Status: DC

## 2012-08-02 NOTE — Telephone Encounter (Signed)
Last OV 05/09/12.

## 2012-08-02 NOTE — Telephone Encounter (Signed)
This has been changed in medications and resent to pharmacy to clarify. Metformin 1000 mg po bid.

## 2012-08-02 NOTE — Telephone Encounter (Signed)
Call [facility] - states pt has been taking Metformin HCL 1000mg  not Glumetza 24hr tab. Unable to get in contact w/pt. Please clarify.  Thanks

## 2012-10-02 ENCOUNTER — Other Ambulatory Visit: Payer: Self-pay | Admitting: *Deleted

## 2012-10-02 DIAGNOSIS — E119 Type 2 diabetes mellitus without complications: Secondary | ICD-10-CM

## 2012-10-02 MED ORDER — INSULIN GLARGINE 100 UNIT/ML ~~LOC~~ SOLN: 45.0000 [IU] | Freq: Two times a day (BID) | SUBCUTANEOUS | Status: DC

## 2012-10-15 ENCOUNTER — Encounter: Payer: Self-pay | Admitting: Internal Medicine

## 2012-10-15 ENCOUNTER — Telehealth: Payer: Self-pay

## 2012-10-15 NOTE — Telephone Encounter (Signed)
Does pt need to RTC? 

## 2012-10-15 NOTE — Telephone Encounter (Signed)
PT WANTS AN INHALER CALLED IN TO [facility]

## 2012-10-16 ENCOUNTER — Other Ambulatory Visit: Payer: Self-pay | Admitting: *Deleted

## 2012-10-16 MED ORDER — ALBUTEROL SULFATE HFA 108 (90 BASE) MCG/ACT IN AERS: 2.0000 | INHALATION_SPRAY | Freq: Four times a day (QID) | RESPIRATORY_TRACT | Status: DC | PRN

## 2012-10-16 NOTE — Telephone Encounter (Signed)
Called patient to advise  °

## 2012-10-16 NOTE — Telephone Encounter (Signed)
Looks like pt is now seeing Dr. Kem Kays at [facility].  Needs to get RFs from them.  We have not seen her since April 2013 for acute illness.  If she would like [place] to prescribe an inhaler, she needs and OV.

## 2012-10-24 ENCOUNTER — Encounter: Payer: Self-pay | Admitting: Internal Medicine

## 2012-10-24 ENCOUNTER — Ambulatory Visit (INDEPENDENT_AMBULATORY_CARE_PROVIDER_SITE_OTHER): Payer: 59 | Admitting: Internal Medicine

## 2012-10-24 VITALS — BP 154/84 | HR 97 | Temp 96.7°F | Resp 20 | Ht 67.5 in | Wt 383.3 lb

## 2012-10-24 DIAGNOSIS — Z79899 Other long term (current) drug therapy: Secondary | ICD-10-CM

## 2012-10-24 DIAGNOSIS — I10 Essential (primary) hypertension: Secondary | ICD-10-CM

## 2012-10-24 DIAGNOSIS — E119 Type 2 diabetes mellitus without complications: Secondary | ICD-10-CM

## 2012-10-24 DIAGNOSIS — J45909 Unspecified asthma, uncomplicated: Secondary | ICD-10-CM

## 2012-10-24 MED ORDER — INSULIN GLARGINE 100 UNIT/ML ~~LOC~~ SOLN: 50.0000 [IU] | Freq: Two times a day (BID) | SUBCUTANEOUS | Status: DC

## 2012-10-24 NOTE — Progress Notes (Signed)
Subjective:   Patient ID: Terri Heath female   DOB: 11-29-1955 57 y.o.   MRN: [id number]  HPI: Terri Heath is a 57 y.o. female with past medications significant as outlined below who presented to the clinic for refill of Dulera. She was seen in the past by Nadyne Coombes but the clinic closed down. Patient reports that she had been in the past on albuterol and Atrovent and whenever the spring season started she was placed on Dulera. She does not recall who started her on it. She ran out 2 weeks ago and since then has been using her albuterol once a day. It feels more like shortness of breath rather than wheezing she noted a difference since she has been not taking it. She does not recall having a PFT done in the past.  DM: Glucose average : 194 , currently taking Lantus 45 units twice a day along with Humalog sliding scale. She noted that she has been seen a diabetic educator in the past. She has been trying hard to lose weight without any significant improvement. She wanted to undergo bariatric surgery  but unfortunately did not quantify. She things she may had used Byetta in the past and would like to use it again.    Past Medical History  Diagnosis Date  . Obesity   . Borderline hypertension   . Hyperlipidemia   . Bilateral shoulder pain   . Diabetes mellitus     Diagnosed in 2000   Current Outpatient Prescriptions  Medication Sig Dispense Refill  . acetaminophen (TYLENOL) 500 MG tablet Take 325 mg by mouth every 6 (six) hours as needed. For pain      . albuterol (PROAIR HFA) 108 (90 BASE) MCG/ACT inhaler Inhale 2 puffs into the lungs every 6 (six) hours as needed. wheezing  6.7 g  2  . aspirin 81 MG chewable tablet Chew 81 mg by mouth daily.      Marland Kitchen glucose blood test strip Use as instructed  100 each  12  . glucose blood test strip Use to test blood sugars 3 times daily. One Touch Verio Test Strips. Dx Code: 250.00.  100 each  5  . insulin glargine (LANTUS) 100 UNIT/ML  injection Inject 50 Units into the skin 2 (two) times daily.  15 mL  3  . insulin lispro (HUMALOG PEN) 100 UNIT/ML injection Per sliding scale (used formula #Units = (Blood glucose -140)/40.  21 mL  5  . loratadine (CLARITIN) 10 MG tablet Take 10 mg by mouth daily with breakfast.       . metFORMIN (GLUCOPHAGE) 1000 MG tablet Take 1 tablet (1,000 mg total) by mouth 2 (two) times daily with a meal.  60 tablet  2  . Multiple Vitamin (MULITIVITAMIN WITH MINERALS) TABS Take 1 tablet by mouth daily with breakfast.      . NEEDLE, DISP, 30 G (BD DISP NEEDLES) 30G X 1/2" MISC For use with Solostar pen.  100 each  3   No current facility-administered medications for this visit.   No family history on file. History   Social History  . Marital Status: Married    Spouse Name: N/A    Number of Children: N/A  . Years of Education: N/A   Social History Main Topics  . Smoking status: Former Smoker -- 1.00 packs/day for 15 years    Types: Cigarettes    Quit date: 01/28/1990  . Smokeless tobacco: Never Used  . Alcohol Use: 1.2 oz/week  1 Cans of beer, 1 Glasses of wine per week     Comment: rare  . Drug Use: No  . Sexually Active: None   Other Topics Concern  . None   Social History Narrative  . None   Review of Systems: Constitutional: Denies fever, chills, diaphoresis, appetite change and fatigue.    Respiratory: Denies SOB, DOE, cough, chest tightness,  and wheezing.   Cardiovascular: Denies chest pain, palpitations and leg swelling.  Gastrointestinal: Denies nausea, vomiting, abdominal pain, diarrhea, constipation  Objective:  Physical Exam: Filed Vitals:   10/24/12 1524  BP: 148/84  Pulse: 97  Temp: 96.7 F (35.9 C)  TempSrc: Oral  Resp: 20  Height: 5' 7.5" (1.715 m)  Weight: 383 lb 4.8 oz (173.864 kg)  SpO2: 97%   Constitutional: Vital signs reviewed.  Patient is a well-developed and well-nourished female  in no acute distress and cooperative with exam. Alert and oriented  x3.   Neck: Supple,  Cardiovascular: RRR, S1 normal, S2 normal, no MRG, pulses symmetric and intact bilaterally Pulmonary/Chest: CTAB, no wheezes, rales, or rhonchi Abdominal: Soft. Non-tender, non-distended, bowel sounds are normal,   Neurological: A&O x3

## 2012-10-24 NOTE — Assessment & Plan Note (Signed)
Patient was on dulera in the past. She ran out of the medication  2 weeks ago. She would like to try it without it. I'll refer patient to pulmonology to obtain a pulmonary function test.

## 2012-10-24 NOTE — Assessment & Plan Note (Addendum)
Hemoglobin A1c today is 8.9. I will increase Lantus to 50 units twice a day and continue the Humalog sliding. Further referred patient to diabetic educator. She would like to discuss with PCP about Byetta. Discussed in length about diet and exercise. Patient's more than ready to do so. Will obtain urine microalbumin ratio today

## 2012-10-24 NOTE — Patient Instructions (Addendum)
Will increase Lantus to 50 units daily.

## 2012-10-24 NOTE — Assessment & Plan Note (Signed)
Patient is currently on no medication. Blood pressure today was mildly elevated. I'll have patient check her blood pressures at home and have her reevaluated during the next office visit. If blood pressure continues to be elevated she needs to be started on medication considering patient's diabetes appear

## 2012-10-25 LAB — MICROALBUMIN / CREATININE URINE RATIO: Creatinine, Urine: 130.4 mg/dL

## 2012-11-05 ENCOUNTER — Encounter: Payer: Self-pay | Admitting: Pulmonary Disease

## 2012-11-05 ENCOUNTER — Ambulatory Visit (INDEPENDENT_AMBULATORY_CARE_PROVIDER_SITE_OTHER): Payer: 59 | Admitting: Pulmonary Disease

## 2012-11-05 VITALS — BP 128/84 | HR 96 | Temp 98.3°F | Ht 67.5 in | Wt 383.0 lb

## 2012-11-05 DIAGNOSIS — J45909 Unspecified asthma, uncomplicated: Secondary | ICD-10-CM

## 2012-11-05 DIAGNOSIS — G4733 Obstructive sleep apnea (adult) (pediatric): Secondary | ICD-10-CM

## 2012-11-05 NOTE — Progress Notes (Signed)
Subjective:    Patient ID: Terri Heath, female    DOB: 04-17-1956, 57 y.o.   MRN: [id number]  HPI PCP - Hal Hope, [facility]   56/F , ward secretary at [employer], presents for FU of obstructive sleep apnea  EpWorth sleepiness score is 6/24. She reports occasional naps.She is an insulin dependent diabetic.  PSG 5/09 (335 lbs) showed AHI 26/h with severe in REM, moderate PLMs. She has gained about 30 pounds since the study.  Download 10/8 -07/03/11 >> good complaince ,GOod control of events on 12 cm    11/05/2012  Gained 20 lbs WORSE SOB SINCE 6 WKS GIVEN DULERA AT [facility] 1 yr ago Was using advair & albuterol MDI x 10 y, no childhood h/o asthma She ran out 2 weeks ago and since then has been using her albuterol once a day. It feels more like shortness of breath rather than wheezing. She does not recall having a PFT done in the past.   Mobile home, 2 dogs, husband smokes, carpets Compliant with CPAP -mask ok , pressure ok  Labs 9/13 -HbA1c 8.9, no eosinophils 3/10 CXR no infx, VQ scan neg  SPirometry showed FEv1 64% with ratio of 72, FVC was 71% - mild restriction   Past Medical History  Diagnosis Date  . Obesity   . Borderline hypertension   . Hyperlipidemia   . Bilateral shoulder pain   . Diabetes mellitus     Diagnosed in 2000    Past Surgical History  Procedure Laterality Date  . Cesarean section      x 3  . Breath tek h pylori  09/18/2011    Procedure: BREATH TEK H PYLORI;  Surgeon: Kandis Cocking, MD;  Location: [facility];  Service: General;  Laterality: N/A;    History   Social History  . Marital Status: Married    Spouse Name: N/A    Number of Children: N/A  . Years of Education: N/A   Occupational History  . Not on file.   Social History Main Topics  . Smoking status: Former Smoker -- 1.00 packs/day for 15 years    Types: Cigarettes    Quit date: 01/28/1990  . Smokeless tobacco: Never Used  . Alcohol Use: 1.2 oz/week    1 Cans of beer, 1 Glasses of  wine per week     Comment: rare  . Drug Use: No  . Sexually Active: Not on file   Other Topics Concern  . Not on file   Social History Narrative  . No narrative on file    Allergies  Allergen Reactions  . Exenatide   . Other Diarrhea    Kale=food  . Statins Other (See Comments)    Leg cramps       Review of Systems  Constitutional: Negative for fever and unexpected weight change.  HENT: Positive for congestion and rhinorrhea. Negative for ear pain, nosebleeds, sore throat, sneezing, trouble swallowing, dental problem, postnasal drip and sinus pressure.   Eyes: Negative for redness and itching.  Respiratory: Positive for cough and shortness of breath. Negative for chest tightness and wheezing.   Cardiovascular: Negative for palpitations and leg swelling.  Gastrointestinal: Negative for nausea and vomiting.  Genitourinary: Negative for dysuria.  Musculoskeletal: Negative for joint swelling.  Skin: Negative for rash.  Neurological: Negative for headaches.  Hematological: Does not bruise/bleed easily.  Psychiatric/Behavioral: Negative for dysphoric mood. The patient is not nervous/anxious.        Objective:   Physical Exam  Gen. Pleasant, obese, in no distress, normal affect ENT - no lesions, no post nasal drip, class 2-3 airway Neck: No JVD, no thyromegaly, no carotid bruits Lungs: no use of accessory muscles, no dullness to percussion, decreased without rales or rhonchi  Cardiovascular: Rhythm regular, heart sounds  normal, no murmurs or gallops, no peripheral edema Abdomen: soft and non-tender, no hepatosplenomegaly, BS normal. Musculoskeletal: No deformities, no cyanosis or clubbing Neuro:  alert, non focal, no tremors        Assessment & Plan:

## 2012-11-05 NOTE — Assessment & Plan Note (Signed)
CPAP is set at 12 cm  Weight loss encouraged, compliance with goal of at least 4-6 hrs every night is the expectation. Advised against medications with sedative side effects Cautioned against driving when sleepy - understanding that sleepiness will vary on a day to day basis  

## 2012-11-05 NOTE — Patient Instructions (Addendum)
Your spirometry shows mild restriction OK to take dulera x 1 month Use albuterol as needed only Spirometry -pre/post next visit CPAP is set at 12 cm

## 2012-11-05 NOTE — Assessment & Plan Note (Addendum)
spirometry shows mild restriction - no obstruction History appears to be ongoing x 10y with persistent symptoms, hence will treat as such - No obvious upper airway or GERD triggers OK to take dulera 100 twice dailyx 1 month Use albuterol as needed only Spirometry -pre/post next visit

## 2012-11-22 ENCOUNTER — Institutional Professional Consult (permissible substitution): Payer: 59 | Admitting: Pulmonary Disease

## 2012-11-22 ENCOUNTER — Ambulatory Visit: Payer: 59 | Admitting: Internal Medicine

## 2012-11-22 ENCOUNTER — Ambulatory Visit: Payer: 59 | Admitting: Dietician

## 2012-11-27 ENCOUNTER — Other Ambulatory Visit: Payer: Self-pay | Admitting: *Deleted

## 2012-11-27 ENCOUNTER — Ambulatory Visit (INDEPENDENT_AMBULATORY_CARE_PROVIDER_SITE_OTHER): Payer: Self-pay | Admitting: Family Medicine

## 2012-11-27 VITALS — BP 124/78

## 2012-11-27 DIAGNOSIS — E119 Type 2 diabetes mellitus without complications: Secondary | ICD-10-CM

## 2012-11-27 MED ORDER — METFORMIN HCL 1000 MG PO TABS: 1000.0000 mg | ORAL_TABLET | Freq: Two times a day (BID) | ORAL | Status: DC

## 2012-11-29 NOTE — Progress Notes (Signed)
Patient presents today for 3 month DM follow-up as part of the employer-sponsored Link to Wellness program. Current regimen includes Lantus, Humalog, and Metformin. Patient also continues on daily ASA. Patient saw MD in early March. No changes to DM regimen made at this time. Patient was referred to pulmonology for review of breathing difficulties associated with bronchitis.   Diabetes Assessment: Type of Diabetes: Type 2; checks blood glucose 2 times a day; checks feet daily; uses glucometer; takes medications as prescribed; takes an aspirin a day; Highest CBG 150s; Lowest CBG 108; hypoglycemia frequency None; MD managing Diabetes Dr. Jonah Blue, [facility]; A1c today of 8.7. Other Diabetes History: Patient is now using a glucose log and recording readings. She is testing BID, fasting and before dinner. Glucose this AM was 175, fasting have been higher, running low 200s. Insulin regimen is as follows: Humalog sliding scale: >200 - 10 u, 175 - 8, 150 - 6, <150 - none Lantus 50 units BID I have recommended patient be more diligent in testing glucose, especially prior to meals, given this is how she determines her Humalog dose. She is not currently testing before each meal and if she does not test, she does not take Humalog, so she is likely missing several meal-time doses per week.   Lifestyle Factors: Exercise - Attending [employer] and has signed up for swimming. Will attempt to swim 2-4 times per week, starting at 20 minutes and working up to 40-60 minutes. Swimming laps at this time, but also interested in water aerobics.   Diet - Patient continues to struggle with diet but is attempting to make positive changes. No longer doing the ,Eat to Live, diet by Dr. Nicholes Mango. Is now attempting to eliminate all carbs except those found in non-starchy vegetables. Patient eats mainly eggs in the AM, low carb meal for lunch including a meat and vegetable. Pt feels she is eating plenty of vegetables.   She has also made a personal decision to eliminate fast food from her diet.   Assessment: Additional Comments: Pt presents today for follow-up. Patient continues to work toward meeting goals, but has faltered some over the past couple months. She has gained ~10-20 lbs since her last follow-up and is very discouraged over this, but wants to continue pressing on. She has recently signed up for swimming at her fitness center and will attempt this three times weekly. Patient has also made a personal decision to limit fast food and this is a great dietary goal for this time. Patient is very motivated to continue her progress and we will follow-up in 8 weeks.   Plan: 1) Great job of resuming exercise! Continue swimming at least 2-3 times weekly. 2) Attempt to maintain dietary changes, including limiting fast food, breads, and sweets 3) Attempt to increase glucose testing. Test prior to each meal, and attempt to test 2 hours after meals to properly assess Humalog dose 4) Return for follow-up in 2 months on Tuesday June 10th @ 10:00 am

## 2012-12-11 NOTE — Progress Notes (Signed)
Patient ID: Barrie Dunker, female   DOB: 03/24/1956, 57 y.o.   MRN: [id number] ATTENDING PHYSICIAN NOTE: I have reviewed the chart and agree with the plan as detailed above. Denny Levy MD Pager [phone]

## 2012-12-23 NOTE — Addendum Note (Signed)
Addended by: Neomia Dear on: 12/23/2012 05:36 PM   Modules accepted: Orders

## 2013-01-17 ENCOUNTER — Other Ambulatory Visit: Payer: Self-pay | Admitting: Family Medicine

## 2013-01-22 ENCOUNTER — Other Ambulatory Visit: Payer: Self-pay

## 2013-01-24 ENCOUNTER — Other Ambulatory Visit: Payer: Self-pay | Admitting: *Deleted

## 2013-01-24 DIAGNOSIS — Z79899 Other long term (current) drug therapy: Secondary | ICD-10-CM

## 2013-01-24 DIAGNOSIS — E785 Hyperlipidemia, unspecified: Secondary | ICD-10-CM

## 2013-01-24 DIAGNOSIS — E119 Type 2 diabetes mellitus without complications: Secondary | ICD-10-CM

## 2013-01-24 DIAGNOSIS — G4733 Obstructive sleep apnea (adult) (pediatric): Secondary | ICD-10-CM

## 2013-01-24 MED ORDER — "NEEDLE (DISP) 30G X 1/2"" MISC": Status: DC

## 2013-02-21 ENCOUNTER — Other Ambulatory Visit: Payer: Self-pay | Admitting: *Deleted

## 2013-02-21 DIAGNOSIS — E119 Type 2 diabetes mellitus without complications: Secondary | ICD-10-CM

## 2013-02-21 MED ORDER — INSULIN GLARGINE 100 UNIT/ML SOLOSTAR PEN: 50.0000 [IU] | PEN_INJECTOR | Freq: Two times a day (BID) | SUBCUTANEOUS | Status: DC

## 2013-02-21 NOTE — Telephone Encounter (Signed)
Pt using solostar PENS

## 2013-03-06 ENCOUNTER — Other Ambulatory Visit: Payer: Self-pay

## 2013-03-28 ENCOUNTER — Ambulatory Visit (INDEPENDENT_AMBULATORY_CARE_PROVIDER_SITE_OTHER): Payer: 59 | Admitting: Family Medicine

## 2013-03-28 VITALS — BP 132/82 | HR 95 | Temp 98.0°F | Resp 17 | Ht 67.0 in | Wt 377.0 lb

## 2013-03-28 DIAGNOSIS — E119 Type 2 diabetes mellitus without complications: Secondary | ICD-10-CM

## 2013-03-28 DIAGNOSIS — L259 Unspecified contact dermatitis, unspecified cause: Secondary | ICD-10-CM

## 2013-03-28 DIAGNOSIS — L309 Dermatitis, unspecified: Secondary | ICD-10-CM

## 2013-03-28 LAB — POCT CBC
Granulocyte percent: 56.5 %G (ref 37–80)
HCT, POC: 42.3 % (ref 37.7–47.9)
Hemoglobin: 13.2 g/dL (ref 12.2–16.2)
Lymph, poc: 2.7 (ref 0.6–3.4)
MCV: 83.8 fL (ref 80–97)
POC Granulocyte: 4.1 (ref 2–6.9)
POC LYMPH PERCENT: 36.9 %L (ref 10–50)
RDW, POC: 16.4 %

## 2013-03-28 LAB — GLUCOSE, POCT (MANUAL RESULT ENTRY): POC Glucose: 118 mg/dl — AB (ref 70–99)

## 2013-03-28 MED ORDER — DOXYCYCLINE HYCLATE 100 MG PO TABS: 100.0000 mg | ORAL_TABLET | Freq: Two times a day (BID) | ORAL | Status: DC

## 2013-03-28 MED ORDER — TRIAMCINOLONE ACETONIDE 0.1 % EX CREA: TOPICAL_CREAM | Freq: Two times a day (BID) | CUTANEOUS | Status: DC

## 2013-03-28 NOTE — Patient Instructions (Addendum)
Use the doxycycline antibiotic as directed.  Take with food and water.  Apply the steroid cream once or twice a day.   Let me know if you are not getting better in the next few days- Sooner if worse.

## 2013-03-28 NOTE — Progress Notes (Signed)
[facility] [address] [place] [address] [phone]  Date:  03/28/2013   Name:  Terri Heath   DOB:  22-Nov-1955   MRN:  [id number]  PCP:  Jonah Blue, DO    Chief Complaint: Cellulitis   History of Present Illness:  Terri Heath is a 57 y.o. very pleasant female patient who presents with the following:  She has noted an infected area on her right lower abdomen/ pannus for the last 5 days.  She has tried topical treatments.  It is not itchy.  Today it started to hurt and to weep.   She did take an aleve this morning    Patient Active Problem List   Diagnosis Date Noted  . Unspecified asthma 10/24/2012  . OSA (obstructive sleep apnea) 05/23/2011  . DIABETES MELLITUS, TYPE II 03/26/2007  . HYPERLIPIDEMIA 03/26/2007  . Morbid obesity, weight 368, BMI 57.7. 03/26/2007  . HYPERTENSION 03/26/2007    Past Medical History  Diagnosis Date  . Obesity   . Borderline hypertension   . Hyperlipidemia   . Bilateral shoulder pain   . Diabetes mellitus     Diagnosed in 2000    Past Surgical History  Procedure Laterality Date  . Cesarean section      x 3  . Breath tek h pylori  09/18/2011    Procedure: BREATH TEK H PYLORI;  Surgeon: Kandis Cocking, MD;  Location: [facility];  Service: General;  Laterality: N/A;    History  Substance Use Topics  . Smoking status: Former Smoker -- 1.00 packs/day for 15 years    Types: Cigarettes    Quit date: 01/28/1990  . Smokeless tobacco: Never Used  . Alcohol Use: 1.2 oz/week    1 Glasses of wine, 1 Cans of beer per week     Comment: rare    History reviewed. No pertinent family history.  Allergies  Allergen Reactions  . Exenatide   . Other Diarrhea    Kale=food  . Statins Other (See Comments)    Leg cramps    Medication list has been reviewed and updated.  Current Outpatient Prescriptions on File Prior to Visit  Medication Sig Dispense Refill  . acetaminophen (TYLENOL) 500 MG tablet  Take 325 mg by mouth every 6 (six) hours as needed. For pain      . albuterol (PROAIR HFA) 108 (90 BASE) MCG/ACT inhaler Inhale 2 puffs into the lungs every 6 (six) hours as needed. wheezing  6.7 g  2  . aspirin 81 MG chewable tablet Chew 81 mg by mouth daily.      Marland Kitchen glucose blood test strip Use to test blood sugars 3 times daily. One Touch Verio Test Strips. Dx Code: 250.00.  100 each  5  . Insulin Glargine (LANTUS SOLOSTAR) 100 UNIT/ML SOPN Inject 50 Units into the skin 2 (two) times daily.  24 mL  1  . insulin lispro (HUMALOG PEN) 100 UNIT/ML injection Per sliding scale (used formula #Units = (Blood glucose -140)/40.  21 mL  5  . loratadine (CLARITIN) 10 MG tablet Take 10 mg by mouth daily with breakfast.       . metFORMIN (GLUCOPHAGE) 1000 MG tablet Take 1 tablet (1,000 mg total) by mouth 2 (two) times daily with a meal.  60 tablet  4  . mometasone-formoterol (DULERA) 200-5 MCG/ACT AERO Inhale 2 puffs into the lungs 2 (two) times daily.      . Multiple Vitamin (MULITIVITAMIN WITH MINERALS)  TABS Take 1 tablet by mouth daily with breakfast.      . NEEDLE, DISP, 30 G (BD DISP NEEDLES) 30G X 1/2" MISC For use with Solostar pen. Injects twice daily. diag code 250.0 insulin dependent  100 each  11   No current facility-administered medications on file prior to visit.    Review of Systems:  As per HPI- otherwise negative.   Physical Examination: Filed Vitals:   03/28/13 1034  BP: 132/82  Pulse: 95  Temp: 98 F (36.7 C)  Resp: 17   Filed Vitals:   03/28/13 1034  Height: 5\' 7"  (1.702 m)  Weight: 377 lb (171.006 kg)   Body mass index is 59.03 kg/(m^2). Ideal Body Weight: Weight in (lb) to have BMI = 25: 159.3  GEN: WDWN, NAD, Non-toxic, A & O x 3, morbid obesity HEENT: Atraumatic, Normocephalic. Neck supple. No masses, No LAD. Ears and Nose: No external deformity. CV: RRR, No M/G/R. No JVD. No thrill. No extra heart sounds. PULM: CTA B, no wheezes, crackles, rhonchi. No  retractions. No resp. distress. No accessory muscle use. ABD: S, NT, ND, +BS. No rebound. No HSM. Abdomen: there is a large 11x14 inch area that is inflamed and appears consistent with a contact dermatitis- less likely superficial cellulitis.  It is non- tender, there is no fluctuance or drainage.  EXTR: No c/c/e NEURO Normal gait.  PSYCH: Normally interactive. Conversant. Not depressed or anxious appearing.  Calm demeanor.    Results for orders placed in visit on 03/28/13  POCT CBC      Result Value Range   WBC 7.2  4.6 - 10.2 K/uL   Lymph, poc 2.7  0.6 - 3.4   POC LYMPH PERCENT 36.9  10 - 50 %L   MID (cbc) 0.5  0 - 0.9   POC MID % 6.6  0 - 12 %M   POC Granulocyte 4.1  2 - 6.9   Granulocyte percent 56.5  37 - 80 %G   RBC 5.05  4.04 - 5.48 M/uL   Hemoglobin 13.2  12.2 - 16.2 g/dL   HCT, POC 40.9  81.1 - 47.9 %   MCV 83.8  80 - 97 fL   MCH, POC 26.1 (*) 27 - 31.2 pg   MCHC 31.2 (*) 31.8 - 35.4 g/dL   RDW, POC 91.4     Platelet Count, POC 355  142 - 424 K/uL   MPV 7.1  0 - 99.8 fL  GLUCOSE, POCT (MANUAL RESULT ENTRY)      Result Value Range   POC Glucose 118 (*) 70 - 99 mg/dl    Assessment and Plan: Dermatitis - Plan: POCT CBC, doxycycline (VIBRA-TABS) 100 MG tablet, triamcinolone cream (KENALOG) 0.1 %  Type II or unspecified type diabetes mellitus without mention of complication, not stated as uncontrolled - Plan: POCT glucose (manual entry)  Rash on panus, right side.  Appears most consistent with a contact dermatitis, but obesity and IDDM make her more likely to develop cellulitis.   Treat with doxycycline and topical triamcinolone as needed.  Let me know if not better in the next few days, Sooner if worse.     Signed Abbe Amsterdam, MD

## 2013-04-23 ENCOUNTER — Ambulatory Visit (INDEPENDENT_AMBULATORY_CARE_PROVIDER_SITE_OTHER): Payer: Self-pay | Admitting: Family Medicine

## 2013-04-23 VITALS — BP 165/99 | Wt 383.0 lb

## 2013-04-23 DIAGNOSIS — E119 Type 2 diabetes mellitus without complications: Secondary | ICD-10-CM

## 2013-04-24 NOTE — Progress Notes (Signed)
Patient presents today for 3 month diabetes follow-up as part of the employer-sponsored Link to Wellness program. Current diabetes regimen includes Metformin, Humalog, and Lantus. Patient also continues on daily ASA. She is not currently taking an ACEi or statin. Most recent MD follow-up was this past June with Dr. Lenard Galloway. Patient will be due for a 6 mo follow-up in November/December, and will need to schedule this soon. No med changes or major health changes at this time.  Diabetes Assessment: Type of Diabetes: Type 2; checks feet daily; uses glucometer; takes an aspirin a day; hypoglycemia frequency None; Sees Diabetes provider 2 times per year; MD managing Diabetes Dr. Nadyne Coombes, PCP; checks blood glucose once daily; does not take medications as prescribed; Lowest CBG 135; Highest CBG 210; A1c 8.4 via POC testing Other Diabetes History: Current med regimen includes Metformin 1,000 mg twice daily, Lantus 50 units twice daily, and Humalog sliding scale with meals. Patient does not maintain good medication compliance and sometimes misses the evening dose of Metformin and always skips Humalog with lunch. Also, patient does not adhere to a humalog sliding scale but simply estimates a dose based on what she is eating. This generally ends up being 10 units or more. Patient did bring meter today but is currently testing once daily, fasting. Per patient recall, fasting glucose usually runs 170-180, with lowest reading of 135 and highest of 210. Hypoglycemia frequency is rare. Patient reports continued signs and symptoms of neuropathy including tingling and discomfort in tips of toes. She has not had foot infection but does report cellulitis and ulceration of abdominal skin folds. This has healed appropriately. Patient is due for yearly eye exam.  Lifestyle Factors: Exercise - Patient is making great efforts at maintaining an exercise regimen. She has applied for the BELT program but does not yet qualify due to  BMI above that which is required for the program. Patient has set a goal to lose 20 lbs by December which will make her eligible for the next 16 week program. She has altered her work schedule so she is now working T, Th, and Sa. This allows her to exercise every other day. She is currently walking twice per day, three days per week, for 20 min each time. She is swimming at [employer], but has allowed this to fall off her schedule, she plans to get back on track. While at work, patient has set an alarm that rings on the hour and reminds her to stand for 10 minutes while she works. Also, at 9 am and 4 pm she and her coworkers watch a 20 minute you-tube walking video.  Diet - Patient is now keeping a food journal and is using My Fitness Pal app. She has found that keeping a food journal helps her limit portion sizes and make better food choices. It has also allowed her to identify low calorie foods that help her remain satisfied longer. For example, she now has a salad, with grilled chicken, [place], or other protein, and has found that this satisfies her until supper. She continues attempting to limit carbs and has even recently thrown away leftover pasta vs eating it for lunch. She admits that starchy carbs are losing their attraction.  Breakfast for patient ususally consists of cereal, egg mcmuffin, or toast Lunch - salad with chicken, [place], etc, fruit on the side Supper - Meat, vegetable, and one starchy carb (pasta, potatoes, corn, rice, etc) We will take a closer look at diet recall in  the future.  Assessment: Pt presents today for follow-up. Patient continues to work toward meeting goals, but has faltered some over the past couple months. She has not been successful in weight loss over the previous few months but will continue to strive for a 20 lb weight loss goal in order to meet BMI requirements for BELT program at [facility]. She continues exercising and is attmepting to make positive dietary  changes by using the MyFitness Pal App and keeping a dietary journal. Patient is motivated to continue her progress and we will follow-up in 12 weeks.   Plan: 1) Continue to pursue BELT program 2) Continue to stay as active as possible 3) Continue making an effort at healthy dietary choices, continue using Fitness Pal App and continue keeping a food journal 4) Continue testing fasting and add a second reading throughout the day. 5) I will contact Dr. Hal Hope for insulin sliding scale 6) Follow-up in 3 months on Wednesday November 26th @ 11:00 am

## 2013-05-05 ENCOUNTER — Telehealth: Payer: Self-pay | Admitting: Pulmonary Disease

## 2013-05-05 NOTE — Telephone Encounter (Signed)
left messages to call and schd follow up apt. No return call back. Sent letter 05/05/13 °

## 2013-06-03 ENCOUNTER — Other Ambulatory Visit: Payer: Self-pay

## 2013-06-03 DIAGNOSIS — Z1231 Encounter for screening mammogram for malignant neoplasm of breast: Secondary | ICD-10-CM

## 2013-06-04 ENCOUNTER — Ambulatory Visit
Admission: RE | Admit: 2013-06-04 | Discharge: 2013-06-04 | Disposition: A | Discharge status: Home / Self Care | Payer: 59 | Source: Ambulatory Visit

## 2013-06-04 DIAGNOSIS — Z1231 Encounter for screening mammogram for malignant neoplasm of breast: Secondary | ICD-10-CM

## 2013-07-03 ENCOUNTER — Other Ambulatory Visit: Payer: Self-pay

## 2013-09-03 ENCOUNTER — Ambulatory Visit (INDEPENDENT_AMBULATORY_CARE_PROVIDER_SITE_OTHER): Payer: Self-pay | Admitting: Family Medicine

## 2013-09-03 VITALS — BP 142/95

## 2013-09-03 DIAGNOSIS — E119 Type 2 diabetes mellitus without complications: Secondary | ICD-10-CM

## 2013-09-03 NOTE — Progress Notes (Signed)
Patient presents today for 3 month diabetes follow-up as part of the employer-sponsored [employer] program. Current diabetes regimen includes Humalog, Lantus, and Metformin. Patient also continues on daily ASA. Most recent MD follow-up was October 2014. Patient has a pending appt for this Monday, Sep 08, 2013. No med changes at this time. Patient had to discontinue Invokana due to adverse effects. Patient has had two recent episodes of lower abdominal cellulitis which are now completely healed.   Diabetes Assessment: Type of Diabetes: Type 2; Diabetes Education Went through formal class education; checks feet daily; uses glucometer; takes an aspirin a day; hypoglycemia frequency None; Sees Diabetes provider 2 times per year; MD managing Diabetes Dr. Horald Pollen, PCP; checks blood glucose once daily; does not take medications as prescribed; 7 day CBG average 192; 14 day CBG average 175; 30 day CBG average 168; Highest CBG 217; Lowest CBG 127; A1c - 8.1 (down from 8.4) Other Diabetes History: Current med regimen includes Metformin 1,000 mg twice daily, Lantus 50 units twice daily, and Humalog sliding scale with meals. Patient does not maintain good medication compliance and sometimes misses the evening dose of Metformin and always skips Humalog with lunch. Also, patient does not adhere to a humalog sliding scale but simply estimates a dose based on what she is eating and on pre-meal glucose. If glucose is >210, patient takes 10units, if it is <200, patient takes nothing. I have asked patient to discuss dosing with Dr. Darron Doom at her upcoming appt. Patient did bring meter today but is currently testing once daily, fasting. Fasting glucose usually runs 140-170, with lowest reading of 127 and highest of 217. Hypoglycemia frequency is rare. Patient reports continued signs and symptoms of neuropathy including tingling and discomfort in tips of toes. She has not had foot infection but does report cellulitis and  ulceration of abdominal skin folds. This has healed appropriately. Patient is not due for yearly eye exam.   Lifestyle Factors: Exercise - Patient and her coworkers have started exercising at work. They have stand-up desks and try to stand while working as much as possible. They also do a walking video while working at their desk. While at work patient walks at 8 am, 11 am, and 3 pm (30 minutes each time). Works 3 days per week, so on these days she exercises. She is logging exercise on [employer]. We have discussed also incorporating exercise and walk videos while at home, patient is agreeable to try to walk in place while cooking and doing other house work.  Diet - Patient has attempted to make improvements to diet and admits that her diet is better at work vs at home. She packs her lunch at work and this often includes fruits/vegetables and lean meats, etc. Orange/apple, etc, cheese, nuts, salad. Watching portion sizes during the day, especially at work. While at home, patient enjoys cooking but admits that she does not prepare the most healthy meals. She is using the [employer] app and wants to start using their food journal.  Goals: salad every day, 30 min exercise daily  Assessment: Pt presents today for follow-up. Patient continues to work toward meeting goals, but has faltered some over the past couple months. She has not been successful in weight loss over the previous few months but will continue to attempt to improve diet and exercise. She continues exercising and is attmepting to make positive dietary changes by using the [employer] app. Patient is motivated to continue her progress and we will follow-up in  12 weeks.   Plan: 1) Eat at least one salad per day 2) 90 min of exercise three days per week (walking program while at work) 3) More encouraging circle of friends 4) Continue compliance with Metformin, great job with this 5) Follow-up with Dr. Darron Doom on Monday and inquire about  Humalog dosing  6) Follow-up in 3 month follow-up on Wednesday April 8th @ 11:00 am

## 2013-09-08 NOTE — Progress Notes (Signed)
Patient ID: Terri Heath, female   DOB: 1956/05/14, 58 y.o.   MRN: [id number] ATTENDING PHYSICIAN NOTE: I have reviewed the chart and agree with the plan as detailed above. Dorcas Mcmurray MD Pager [phone]

## 2013-09-15 DIAGNOSIS — F32A Depression, unspecified: Secondary | ICD-10-CM

## 2013-09-15 DIAGNOSIS — F329 Major depressive disorder, single episode, unspecified: Secondary | ICD-10-CM | POA: Insufficient documentation

## 2013-09-15 HISTORY — DX: Depression, unspecified: F32.A

## 2013-10-12 ENCOUNTER — Encounter (HOSPITAL_COMMUNITY): Payer: Self-pay | Admitting: Emergency Medicine

## 2013-10-12 ENCOUNTER — Emergency Department (HOSPITAL_COMMUNITY)
Admission: EM | Admit: 2013-10-12 | Discharge: 2013-10-12 | Disposition: A | Discharge status: Home / Self Care | Payer: 59 | Source: Home / Self Care

## 2013-10-12 DIAGNOSIS — L0291 Cutaneous abscess, unspecified: Secondary | ICD-10-CM

## 2013-10-12 DIAGNOSIS — L0390 Cellulitis, unspecified: Principal | ICD-10-CM

## 2013-10-12 MED ORDER — CLINDAMYCIN HCL 300 MG PO CAPS: 300.0000 mg | ORAL_CAPSULE | Freq: Four times a day (QID) | ORAL | Status: DC

## 2013-10-12 NOTE — ED Provider Notes (Signed)
CSN: [id number]     Arrival date & time 10/12/13  0902 History   First MD Initiated Contact with Patient 10/12/13 [phone]     Chief Complaint  Patient presents with  . Recurrent Skin Infections     (Consider location/radiation/quality/duration/timing/severity/associated sxs/prior Treatment) HPI Comments: 58 year old morbidly obese female with type 2 diabetes mellitus presents with a complaint of "cellulitis" to her abdomen. Discharge possibly 4 days ago. The area of erythema covered approximately 1/4 of her right lower abdomen. She states that she developed fever over the first couple of days but today she is feeling better. It has been draining for the past 2 days and a slowing now. There is less pain and tenderness.   Past Medical History  Diagnosis Date  . Obesity   . Borderline hypertension   . Hyperlipidemia   . Bilateral shoulder pain   . Diabetes mellitus     Diagnosed in 2000   Past Surgical History  Procedure Laterality Date  . Cesarean section      x 3  . Breath tek h pylori  09/18/2011    Procedure: BREATH TEK H PYLORI;  Surgeon: Shann Medal, MD;  Location: [facility];  Service: General;  Laterality: N/A;   History reviewed. No pertinent family history. History  Substance Use Topics  . Smoking status: Former Smoker -- 1.00 packs/day for 15 years    Types: Cigarettes    Quit date: 01/28/1990  . Smokeless tobacco: Never Used  . Alcohol Use: 1.2 oz/week    1 Glasses of wine, 1 Cans of beer per week     Comment: rare   OB History   Grav Para Term Preterm Abortions TAB SAB Ect Mult Living                 Review of Systems  Constitutional: Negative.   Cardiovascular: Negative.   Gastrointestinal: Negative.   Genitourinary: Negative.   Skin:       As per history of present illness      Allergies  Exenatide; Other; and Statins  Home Medications   Current Outpatient Rx  Name  Route  Sig  Dispense  Refill  . acetaminophen (TYLENOL) 500 MG tablet  Oral   Take 325 mg by mouth every 6 (six) hours as needed. For pain         . albuterol (PROAIR HFA) 108 (90 BASE) MCG/ACT inhaler   Inhalation   Inhale 2 puffs into the lungs every 6 (six) hours as needed. wheezing   6.7 g   2   . aspirin 81 MG chewable tablet   Oral   Chew 81 mg by mouth daily.         . clindamycin (CLEOCIN) 300 MG capsule   Oral   Take 1 capsule (300 mg total) by mouth 4 (four) times daily. X 10 days   40 capsule   0   . doxycycline (VIBRA-TABS) 100 MG tablet   Oral   Take 1 tablet (100 mg total) by mouth 2 (two) times daily.   20 tablet   0   . glucose blood test strip      Use to test blood sugars 3 times daily. One Touch Verio Test Strips. Dx Code: 250.00.   100 each   5   . Insulin Glargine (LANTUS SOLOSTAR) 100 UNIT/ML SOPN   Subcutaneous   Inject 50 Units into the skin 2 (two) times daily.   24 mL   1   .  insulin lispro (HUMALOG PEN) 100 UNIT/ML injection      Per sliding scale (used formula #Units = (Blood glucose -140)/40.   21 mL   5     Please give pens with a 30 day supply.   . loratadine (CLARITIN) 10 MG tablet   Oral   Take 10 mg by mouth daily with breakfast.          . metFORMIN (GLUCOPHAGE) 1000 MG tablet   Oral   Take 1 tablet (1,000 mg total) by mouth 2 (two) times daily with a meal.   60 tablet   4   . mometasone-formoterol (DULERA) 200-5 MCG/ACT AERO   Inhalation   Inhale 2 puffs into the lungs 2 (two) times daily.         . Multiple Vitamin (MULITIVITAMIN WITH MINERALS) TABS   Oral   Take 1 tablet by mouth daily with breakfast.         . NEEDLE, DISP, 30 G (BD DISP NEEDLES) 30G X 1/2" MISC      For use with Solostar pen. Injects twice daily. diag code 250.0 insulin dependent   100 each   11     To give with Solostar pen, if compatible, if not g ...   . triamcinolone cream (KENALOG) 0.1 %   Topical   Apply topically 2 (two) times daily.   30 g   0    BP 146/101  Pulse 93  Temp(Src) 97.5  F (36.4 C) (Oral)  Resp 18  SpO2 97% Physical Exam  Nursing note and vitals reviewed. Constitutional: She is oriented to person, place, and time. She appears well-developed and well-nourished.  Cardiovascular: Normal rate.   Pulmonary/Chest: Effort normal. No respiratory distress.  Abdominal: Soft. There is no tenderness. There is no rebound and no guarding.  Neurological: She is alert and oriented to person, place, and time. She exhibits normal muscle tone.  Skin: Skin is warm.  There is a large area that opposed to the right lower quadrant of the abdomen with reddened induration primarily involving the dermis. There are 3 areas in which a fistula is allowing a honey colored drainage. There is no area of deep induration.  Psychiatric: She has a normal mood and affect.    ED Course  Procedures (including critical care time) Labs Review Labs Reviewed - No data to display Imaging Review No results found.    MDM   Final diagnoses:  Abscess and cellulitis    Since the patient is feeling better, and the induration has decreased and there are 3 natural areas of a honey colored drainage was discontinued to apply dressing heat and start clindamycin 300 twice a day. She has an appointment with her physician for this tomorrow. Plan culture pending.     Janne Napoleon, NP 10/12/13 [phone]

## 2013-10-12 NOTE — ED Provider Notes (Signed)
Medical screening examination/treatment/procedure(s) were performed by resident physician or non-physician practitioner and as supervising physician I was immediately available for consultation/collaboration.   Pauline Good MD.   Billy Fischer, MD 10/12/13 1126

## 2013-10-12 NOTE — Discharge Instructions (Signed)
Abscess °An abscess is an infected area that contains a collection of pus and debris. It can occur in almost any part of the body. An abscess is also known as a furuncle or boil. °CAUSES  °An abscess occurs when tissue gets infected. This can occur from blockage of oil or sweat glands, infection of hair follicles, or a minor injury to the skin. As the body tries to fight the infection, pus collects in the area and creates pressure under the skin. This pressure causes pain. People with weakened immune systems have difficulty fighting infections and get certain abscesses more often.  °SYMPTOMS °Usually an abscess develops on the skin and becomes a painful mass that is red, warm, and tender. If the abscess forms under the skin, you may feel a moveable soft area under the skin. Some abscesses break open (rupture) on their own, but most will continue to get worse without care. The infection can spread deeper into the body and eventually into the bloodstream, causing you to feel ill.  °DIAGNOSIS  °Your caregiver will take your medical history and perform a physical exam. A sample of fluid may also be taken from the abscess to determine what is causing your infection. °TREATMENT  °Your caregiver may prescribe antibiotic medicines to fight the infection. However, taking antibiotics alone usually does not cure an abscess. Your caregiver may need to make a small cut (incision) in the abscess to drain the pus. In some cases, gauze is packed into the abscess to reduce pain and to continue draining the area. °HOME CARE INSTRUCTIONS  °· Only take over-the-counter or prescription medicines for pain, discomfort, or fever as directed by your caregiver. °· If you were prescribed antibiotics, take them as directed. Finish them even if you start to feel better. °· If gauze is used, follow your caregiver's directions for changing the gauze. °· To avoid spreading the infection: °· Keep your draining abscess covered with a  bandage. °· Wash your hands well. °· Do not share personal care items, towels, or whirlpools with others. °· Avoid skin contact with others. °· Keep your skin and clothes clean around the abscess. °· Keep all follow-up appointments as directed by your caregiver. °SEEK MEDICAL CARE IF:  °· You have increased pain, swelling, redness, fluid drainage, or bleeding. °· You have muscle aches, chills, or a general ill feeling. °· You have a fever. °MAKE SURE YOU:  °· Understand these instructions. °· Will watch your condition. °· Will get help right away if you are not doing well or get worse. °Document Released: 05/24/2005 Document Revised: 02/13/2012 Document Reviewed: 10/27/2011 °[employer]® Patient Information ©2014 [employer]. ° °Cellulitis °Cellulitis is an infection of the skin and the tissue beneath it. The infected area is usually red and tender. Cellulitis occurs most often in the arms and lower legs.  °CAUSES  °Cellulitis is caused by bacteria that enter the skin through cracks or cuts in the skin. The most common types of bacteria that cause cellulitis are Staphylococcus and Streptococcus. °SYMPTOMS  °· Redness and warmth. °· Swelling. °· Tenderness or pain. °· Fever. °DIAGNOSIS  °Your caregiver can usually determine what is wrong based on a physical exam. Blood tests may also be done. °TREATMENT  °Treatment usually involves taking an antibiotic medicine. °HOME CARE INSTRUCTIONS  °· Take your antibiotics as directed. Finish them even if you start to feel better. °· Keep the infected arm or leg elevated to reduce swelling. °· Apply a warm cloth to the affected area up to   4 times per day to relieve pain.  Only take over-the-counter or prescription medicines for pain, discomfort, or fever as directed by your caregiver.  Keep all follow-up appointments as directed by your caregiver. SEEK MEDICAL CARE IF:   You notice red streaks coming from the infected area.  Your red area gets larger or turns dark in  color.  Your bone or joint underneath the infected area becomes painful after the skin has healed.  Your infection returns in the same area or another area.  You notice a swollen bump in the infected area.  You develop new symptoms. SEEK IMMEDIATE MEDICAL CARE IF:   You have a fever.  You feel very sleepy.  You develop vomiting or diarrhea.  You have a general ill feeling (malaise) with muscle aches and pains. MAKE SURE YOU:   Understand these instructions.  Will watch your condition.  Will get help right away if you are not doing well or get worse. Document Released: 05/24/2005 Document Revised: 02/13/2012 Document Reviewed: 10/30/2011 [facility] Patient Information 2014 [employer].  Community-Associated MRSA CA-MRSA stands for community-associated methicillin-resistant Staphylococcus aureus. MRSA is a type of bacteria that is resistant to some common antibiotics. It can cause infections in the skin and many other places in the body. Staphylococcus aureus, often called "staph," is a bacteria that normally lives on the skin or in the nose. Staph on the surface of the skin or in the nose does not cause problems. However, if the staph enters the body through a cut, wound, or break in the skin, an infection can happen. Up until recently, infections with the MRSA type of staph mainly occurred in hospitals and other healthcare settings. There are now increasing problems with MRSA infections in the community as well. Infections with MRSA may be very serious or even life-threatening. CA-MRSA is becoming more common. It is known to spread in crowded settings, in jails and prisons, and in situations where there is close skin-to-skin contact, such as during sporting events or in locker rooms. MRSA can be spread through shared items, such as children's toys, razors, towels, or sports equipment.  CAUSES All staph, including MRSA, are normally harmless unless they enter the body through a  scratch, cut, or wound, such as with surgery. All staph, including MRSA, can be spread from person-to-person by touching contaminated objects or through direct contact.  MRSA now causes illness in people who have not been in hospitals or other healthcare facilities. Cases of MRSA diseases in the community have been associated with:  Recent antibiotic use.  Sharing contaminated towels or clothes.  Having active skin diseases.  Participating in contact sports.  Living in crowded settings.  Intravenous (IV) drug use.  Community-associated MRSA infections are usually skin infections, but may cause other severe illnesses.  Staph bacteria are one of the most common causes of skin infection. However, they are also a common cause of pneumonia, bone or joint infections, and bloodstream infections. DIAGNOSIS Diagnosis of MRSA is done by cultures of fluid samples that may come from:  Swabs taken from cuts or wounds in infected areas.  Nasal swabs.  Saliva or deep cough specimens from the lungs (sputum).  Urine.  Blood. Many people are "colonized" with MRSA but have no signs of infection. This means that people carry the MRSA germ on their skin or in their nose and may never develop MRSA infection.  TREATMENT  Treatment varies and is based on how serious, how deep, or how extensive the infection is.  For example:  Some skin infections, such as a small boil or abscess, may be treated by draining yellowish-white fluid (pus) from the site of the infection.  Deeper or more widespread soft tissue infections are usually treated with surgery to drain pus and with antibiotic medicine given by vein or by mouth. This may be recommended even if you are pregnant.  Serious infections may require a hospital stay. If antibiotics are given, they may be needed for several weeks. PREVENTION Because many people are colonized with staph, including MRSA, preventing the spread of the bacteria from  person-to-person is most important. The best way to prevent the spread of bacteria and other germs is through proper hand washing or by using alcohol-based hand disinfectants. The following are other ways to help prevent MRSA infection within community settings.   Wash your hands frequently with soap and water for at least 15 seconds. Otherwise, use alcohol-based hand disinfectants when soap and water is not available.  Make sure people who live with you wash their hands often, too.  Do not share personal items. For example, avoid sharing razors and other personal hygiene items, towels, clothing, and athletic equipment.  Wash and dry your clothes and bedding at the warmest temperatures recommended on the labels.  Keep wounds covered. Pus from infected sores may contain MRSA and other bacteria. Keep cuts and abrasions clean and covered with germ-free (sterile), dry bandages until they are healed.  If you have a wound that appears infected, ask your caregiver if a culture for MRSA and other bacteria should be done.  If you are breastfeeding, talk to your caregiver about MRSA. You may be asked to temporarily stop breastfeeding. HOME CARE INSTRUCTIONS   Take your antibiotics as directed. Finish them even if you start to feel better.  Avoid close contact with those around you as much as possible. Do not use towels, razors, toothbrushes, bedding, or other items that will be used by others.  To fight the infection, follow your caregiver's instructions for wound care. Wash your hands before and after changing your bandages.  If you have an intravascular device, such as a catheter, make sure you know how to care for it.  Be sure to tell any healthcare providers that you have MRSA so they are aware of your infection. SEEK IMMEDIATE MEDICAL CARE IF:  The infection appears to be getting worse. Signs include:  Increased warmth, redness, or tenderness around the wound site.  A red line that extends  from the infection site.  A dark color in the area around the infection.  Wound drainage that is tan, yellow, or green.  A bad smell coming from the wound.  You feel sick to your stomach (nauseous) and throw up (vomit) or cannot keep medicine down.  You have a fever.  Your baby is older than 3 months with a rectal temperature of 102 F (38.9 C) or higher.  Your baby is 71 months old or younger with a rectal temperature of 100.4 F (38 C) or higher.  You have difficulty breathing. MAKE SURE YOU:   Understand these instructions.  Will watch your condition.  Will get help right away if you are not doing well or get worse. Document Released: 11/17/2005 Document Revised: 11/06/2011 Document Reviewed: 11/17/2010 [facility] Patient Information 2014 [employer].

## 2013-10-12 NOTE — ED Notes (Signed)
C/o cellulitis on buttocks States inflammation started Wednesday unable to touch  States area is draining  Did keep area clean

## 2013-10-14 LAB — CULTURE, ROUTINE-ABSCESS

## 2013-10-14 NOTE — ED Notes (Addendum)
Abscess culture R lower abdomen: Abundant group B Strep (S. Agalactiae)- no sensitivity due to predictability of PCN.  Pt. treated with Cleocin.  Message sent to Janne Napoleon NP. Roselyn Meier 10/14/2013 Shanon Brow wrote that is it OK. 10/14/2013

## 2013-12-04 ENCOUNTER — Other Ambulatory Visit: Payer: Self-pay

## 2014-02-17 ENCOUNTER — Ambulatory Visit (INDEPENDENT_AMBULATORY_CARE_PROVIDER_SITE_OTHER): Payer: Self-pay | Admitting: Family Medicine

## 2014-02-17 DIAGNOSIS — E118 Type 2 diabetes mellitus with unspecified complications: Secondary | ICD-10-CM

## 2014-02-18 ENCOUNTER — Telehealth: Payer: Self-pay | Admitting: Family Medicine

## 2014-02-18 NOTE — Telephone Encounter (Signed)
Called Pt about DM care. When Pt calls back, please schedule appointment to check BP, LDL and A1C.

## 2014-02-19 NOTE — Progress Notes (Signed)
Patient presents today for 3 month diabetes follow-up as part of the employer-sponsored Link to Wellness program. Current diabetes regimen includes Metformin, Lantus, and Humalog. Patient also continues on daily ASA. Patient cannot tolerate statin and is not in ACEi at this time. Most recent MD follow-up was Feb 2015. This follow-up was to evaluate abdominal cellulitis. Patient was on FMLA for 1 week as a result. Cellulitis has now cleared and patient has had no further issues. She is making efforts to better control glucose to prevent recurrent cellulitis. No med changes or major health changes at this time. No follow-up scheduled at this time.     Diabetes Assessment: Type of Diabetes: Type 2; Diabetes Education Went through formal class education; checks feet daily; uses glucometer; takes an aspirin a day; hypoglycemia frequency None; Sees Diabetes provider 2 times per year; MD managing Diabetes Dr. Horald Pollen, PCP; checks blood glucose once daily; does not take medications as prescribed; Lowest CBG 85; Highest CBG 130s; A1c unchanged at 8.1 Other Diabetes History: Current med regimen includes Metformin 1,000 mg twice daily, Lantus 52 units twice daily (2 unit increase), and Humalog sliding scale with meals (10 units breakfast and dinner). Patient has not missed a humalog dose for 2 weeks, doing much better with compliance. Patient continues skipping Humalog at lunch. I have encouraged her to test glucose 2 hours after lunch to determin if she needs to add humalog at lunch. Also, patient does not adhere to a humalog sliding scale but simply estimates a dose based on what she is eating and on pre-meal glucose. Fasting glucose usually runs 140-170, with lowest reading of 127 and highest of 217. Hypoglycemia frequency is rare. Patient reports continued signs and symptoms of neuropathy including tingling and discomfort in tips of toes. She has not had foot infection but does report cellulitis and ulceration  of abdominal skin folds in Feb 2015. This has healed appropriately. Patient is not due for yearly eye exam.  Lifestyle Factors: Exercise - Patient and her coworkers continue exercising at work. They have stand-up desks and try to stand while working as much as possible. They also do a walking video while working at their desk. While at work patient walks at 8 am, 11 am, and 3 pm (30 minutes each time). Works 3 days per week, so on these days she exercises. She is logging exercise on [employer]. She has also purchased an above ground pool for exercise during the summer months. She has not set pool up yet, but hopes to soon. Patient says she feels significantly better, both physically and mentally, when she exercises consistently.  Diet - Patient has made some positive changes but continues to have room for improvement. She is packing lighter lunches and better snacks. Reports she could do better at home on her days off with regard to meal choices. When patient is at home she eats around 5 pm, goes to bed at 10 pm. On work days patient gets home later and admits to eating late in the evening/night which affects her glucose. She is now journaling every aspect of diet in [employer]elifewell and reports this holds her accountable and makes her more honest about her diet.   Assessment: Pt presents today for follow-up. Patient continues to work toward meeting goals, but has faltered some over the past couple months. She has not been successful in weight loss over the previous few months but will continue to attempt to improve diet and exercise. She continues exercising and is attmepting to  make positive dietary changes by using the [employer] app. Patient is motivated to continue her progress and we will follow-up in 12 weeks. A1c is unchanged at this visit.  Plan: 1) Continue to exercise regularly, attempt to get pool up and running and use 1-2 times per week 2) Continue food journaling and controlling  portions/food choices 3) Continue testing at least daily 4) Great job with improving insulin compliance 5) Follow-up in 3 months on Tuesday Sept 22nd @ 11:00 am

## 2014-04-23 ENCOUNTER — Encounter: Payer: Self-pay | Admitting: Family Medicine

## 2014-04-23 NOTE — Progress Notes (Signed)
Patient ID: Terri Heath, female   DOB: April 29, 1956, 58 y.o.   MRN: [id number] Reviewed: Agree with the documentation and management by my [place].

## 2014-04-23 NOTE — Progress Notes (Signed)
Patient ID: Terri Heath, female   DOB: [birth year], 58 y.o.   MRN: [id number] Reviewed: Agree with the documentation and management by my [place].

## 2014-06-04 ENCOUNTER — Encounter: Payer: Self-pay | Admitting: Family Medicine

## 2014-06-04 ENCOUNTER — Ambulatory Visit (INDEPENDENT_AMBULATORY_CARE_PROVIDER_SITE_OTHER): Payer: Self-pay | Admitting: Family Medicine

## 2014-06-04 VITALS — BP 152/88 | Wt 375.0 lb

## 2014-06-04 DIAGNOSIS — G4730 Sleep apnea, unspecified: Secondary | ICD-10-CM | POA: Insufficient documentation

## 2014-06-04 DIAGNOSIS — E118 Type 2 diabetes mellitus with unspecified complications: Secondary | ICD-10-CM

## 2014-06-04 NOTE — Progress Notes (Signed)
Patient presents today for diabetes follow-up as part of the employer-sponsored Link to Wellness program. Current diabetes regimen includes Metformin and Lantus. Patient also continues on daily ASA. Patient has not had a recent follow-up with MD. She has a new PCP, Dr. Janett Billow Copland with [facility], and has an initial appt with her on Oct 18th. No med changes or major health changes at this time.   Diabetes Assessment: Type of Diabetes: Type 2; Diabetes Education Went through formal class education; checks feet daily; uses glucometer; Sees Diabetes provider 2 times per year; checks blood glucose once daily; does not take medications as prescribed; 7 day CBG average 145; 14 day CBG average 145; Highest CBG 162; Lowest CBG 118; hypoglycemia frequency none; MD managing Diabetes Dr. Janett Billow Copland; takes an aspirin a day; A1c 8.0 (prev 8.1) via POC testing Other Diabetes History: Current med regimen includes Metformin 1,000 mg twice daily, Lantus 50 units twice daily. She was previously on Humalog sliding scale with meals, but has discontinued this medication. Patient will discuss this with PCP at upcoming appt. Patient is very compliant with both Metformin and Lantus. Patient stopped testing this past July and resumed testing 2 weeks ago. she has tested 5 times in the past week and 7 times in the past 14 days. I have encouraged her to test at least once daily. She is currently using One Touch Verio, but will switch to [employer] as of new year due to LTW plan changes. Hypoglycemia frequency is rare. Patient reports continued signs and symptoms of neuropathy including tingling and discomfort in tips of toes. No signs of infection. Patient is not due for yearly eye exam, but had a recent exam end of Sept, no signs of retinopathy. A1c today was 8.0 (prev 8.1). Patient is pleased with progress and seems motivated to continue making changes. She has not had an A1c <8 in 15 years and is  motivated to get A1c down to <8.  Lifestyle Factors: Exercise - Patient continues exercising at her desk at work, doing walking videos for 30 minutes at a time. In the past, she has done this three times per day while working. She has had some changes in her work responsibilities and has decreased exercising to twice daily. However, she wants to increase to walk videos three times per day. She also purchased an above-ground pool this summer and attempted to do water aerobics regularly. She has taken the pool down for fall/winter, but will put it back up next summer. Patient also wishes to rejoin [facility] and resume water aerobics and swimming.  Diet - Patient has attempted to maintain a healthy diet. She eats two eggs for breakfast each morning at [employer]. Pt packs her lunch which includes celery, carrots, and hummus, or she sometimes gets a vegetable plate from cafeteria. Snacks include apple and PB or cheese, and plain yogurt with honey. She is also attempting to eat her last meal earlier in the evening vs later.   Assessment: Pt presents today for follow-up. Patient continues to work toward meeting goals, but has faltered some over the past several months. She has not been successful in weight loss over the previous few months but will continue to attempt to improve diet and exercise. She continues to exercise, but plans to increase. She is also attempting to make positive dietary changes by using the [employer] app. Patient is motivated to continue her progress and we will follow-up in 12 weeks. A1c has decreased  at this visit.  Plan: 1) Continue to watch portions and avoid snacking when bored, etc. 2) [place] for swimming 3) Resume exercising three times daily while at work 4) Continue testing at least once per day 5) Discuss Humalog with Dr. Lorelei Pont 6) Follow-up in 3 months on Tuesday Oct 6th @ 11:00 am

## 2014-06-09 ENCOUNTER — Other Ambulatory Visit: Payer: Self-pay | Admitting: Internal Medicine

## 2014-06-10 ENCOUNTER — Other Ambulatory Visit: Payer: Self-pay | Admitting: Radiology

## 2014-06-11 ENCOUNTER — Encounter: Payer: Self-pay | Admitting: Physician Assistant

## 2014-06-11 ENCOUNTER — Ambulatory Visit (INDEPENDENT_AMBULATORY_CARE_PROVIDER_SITE_OTHER): Payer: 59 | Admitting: Physician Assistant

## 2014-06-11 VITALS — BP 152/88 | HR 86 | Temp 98.1°F | Resp 20 | Ht 66.0 in | Wt 375.0 lb

## 2014-06-11 DIAGNOSIS — J309 Allergic rhinitis, unspecified: Secondary | ICD-10-CM

## 2014-06-11 DIAGNOSIS — J302 Other seasonal allergic rhinitis: Secondary | ICD-10-CM

## 2014-06-11 DIAGNOSIS — E785 Hyperlipidemia, unspecified: Secondary | ICD-10-CM

## 2014-06-11 DIAGNOSIS — Z794 Long term (current) use of insulin: Secondary | ICD-10-CM

## 2014-06-11 DIAGNOSIS — G4730 Sleep apnea, unspecified: Secondary | ICD-10-CM

## 2014-06-11 DIAGNOSIS — J45909 Unspecified asthma, uncomplicated: Secondary | ICD-10-CM

## 2014-06-11 DIAGNOSIS — G4733 Obstructive sleep apnea (adult) (pediatric): Secondary | ICD-10-CM

## 2014-06-11 DIAGNOSIS — I10 Essential (primary) hypertension: Secondary | ICD-10-CM

## 2014-06-11 DIAGNOSIS — E119 Type 2 diabetes mellitus without complications: Secondary | ICD-10-CM

## 2014-06-11 HISTORY — DX: Allergic rhinitis, unspecified: J30.9

## 2014-06-11 MED ORDER — LOSARTAN POTASSIUM 50 MG PO TABS: 50.0000 mg | ORAL_TABLET | Freq: Every day | ORAL | Status: DC

## 2014-06-11 NOTE — Progress Notes (Signed)
Patient ID: SHARIE AMORIN MRN: [id number], DOB: [birth year], 58 y.o. Date of Encounter: @DATE @  Chief Complaint:  Chief Complaint  Patient presents with  . new pt est care    needs chronic problems managed,old doctor retired    HPI: 58 y.o. year old white female  presents as a new patient to establish care here.  She says that she and her family actually used to come to this practice years ago. She mentions Dr. Joseph Art, Dr. Laurance Flatten, as well as Dr. Darron Doom. She says that she actually followed Dr. Darron Doom when she left here and followed her to [place] and then to [place] in [facility]. Says that Dr. Darron Doom recently left [place] so patient was left without a provider so she decided to come back here.  Patient works as a Chief of Staff. 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 She works 3 days a week.  She also goes to the [facility]. She has a piece of paper filled out by them, reporting  Hgb A1c 7.7 on 06/09/14.  I asked when she last saw Dr. Darron Doom and she says probably around January 2015.  Last labs by Dr. Darron Doom were also probably around January.  However as above 06/09/14 A1c was 7.7.   Also I mentioned that her blood pressure is elevated today and she says that it was also in the 150s over 80s at the wellness check just the other day.  She reports that in the past multiple statins caused muscle cramps when she would try to exercise. Specifically recalls Crestor and atorvastatin being some of the medicines that caused this problem.  She also states that she took ACE inhibitor for over 10 years. Remembers when she first started that medicine being told that it would help protect her kidneys from the effects of diabetes. However says that she then was told at a pulmonary evaluation that they can have adverse effects regarding pulmonary so she  stopped it and has been off of ACE inhibitor since.  Says she has lost 10 pounds in this year. Says that last year at this time she was 386 and is now 375.   Past Medical History  Diagnosis Date  . Obesity   . Borderline hypertension   . Hyperlipidemia   . Bilateral shoulder pain   . Diabetes mellitus     Diagnosed in 2000  . Allergy   . Sleep apnea      Home Meds: Outpatient Prescriptions Prior to Visit  Medication Sig Dispense Refill  . acetaminophen (TYLENOL) 500 MG tablet Take 325 mg by mouth every 6 (six) hours as needed. For pain      . albuterol (PROAIR HFA) 108 (90 BASE) MCG/ACT inhaler Inhale 2 puffs into the lungs every 6 (six) hours as needed. wheezing  6.7 g  2  . aspirin 81 MG chewable tablet Chew 81 mg by mouth daily.      Marland Kitchen glucose blood test strip Use to test blood sugars 3 times daily. One Touch Verio Test Strips. Dx Code: 250.00.  100 each  5  . Insulin Glargine (LANTUS SOLOSTAR) 100 UNIT/ML SOPN Inject 50 Units into the skin 2 (two) times daily.  24 mL  1  . insulin lispro (HUMALOG PEN) 100 UNIT/ML injection Per sliding scale (used formula #Units = (Blood glucose -140)/40.  21  mL  5  . loratadine (CLARITIN) 10 MG tablet Take 10 mg by mouth daily with breakfast.       . Multiple Vitamin (MULITIVITAMIN WITH MINERALS) TABS Take 1 tablet by mouth daily with breakfast.      . NEEDLE, DISP, 30 G (BD DISP NEEDLES) 30G X 1/2" MISC For use with Solostar pen. Injects twice daily. diag code 250.0 insulin dependent  100 each  11  . metFORMIN (GLUCOPHAGE) 1000 MG tablet Take 1 tablet (1,000 mg total) by mouth 2 (two) times daily with a meal.  60 tablet  4  . triamcinolone cream (KENALOG) 0.1 % Apply topically 2 (two) times daily.  30 g  0   No facility-administered medications prior to visit.    Allergies:  Allergies  Allergen Reactions  . Exenatide   . Lisinopril Other (See Comments)    cramping  . Other Diarrhea    Kale=food  . Statins Other (See Comments)    Leg  cramps    History   Social History  . Marital Status: Married    Spouse Name: N/A    Number of Children: N/A  . Years of Education: N/A   Occupational History  . Not on file.   Social History Main Topics  . Smoking status: Former Smoker -- 1.00 packs/day for 15 years    Types: Cigarettes    Quit date: 01/28/1990  . Smokeless tobacco: Never Used  . Alcohol Use: 1.2 oz/week    1 Glasses of wine, 1 Cans of beer per week     Comment: rare  . Drug Use: No  . Sexual Activity: Not Currently   Other Topics Concern  . Not on file   Social History Narrative  . No narrative on file    Family History  Problem Relation Age of Onset  . Alcohol abuse Mother   . Arthritis Mother   . Diabetes Mother   . Sleep apnea Mother   . Alcohol abuse Father   . Heart disease Father   . Sleep apnea Sister      Review of Systems:  See HPI for pertinent ROS. All other ROS negative.    Physical Exam: Blood pressure 152/88, pulse 86, temperature 98.1 F (36.7 C), temperature source Oral, resp. rate 20, height 5\' 6"  (1.676 m), weight 375 lb (170.099 kg)., Body mass index is 60.56 kg/(m^2). General: Morbidly Obese WF. Appears in no acute distress. Neck: Supple. No thyromegaly. No lymphadenopathy. No carotid bruits.  Lungs: Clear bilaterally to auscultation without wheezes, rales, or rhonchi. Breathing is unlabored. Heart: RRR with S1 S2. No murmurs, rubs, or gallops. Abdomen: Soft, non-tender, non-distended with normoactive bowel sounds. No hepatomegaly. No rebound/guarding. No obvious abdominal masses. Musculoskeletal:  Strength and tone normal for age. Extremities/Skin: Warm and dry.  No edema.  Diabetic foot exam: Inspection is normal. There are no open sores and no calluses or problem areas. Sensation is intact. 2+ bounding posterior tibial pulses and dorsalis pedis pulses bilaterally. Neuro: Alert and oriented X 3. Moves all extremities spontaneously. Gait is normal. CNII-XII grossly in  tact. Psych:  Responds to questions appropriately with a normal affect.     ASSESSMENT AND PLAN:  58 y.o. year old female with  1. Diabetes mellitus type 2, insulin dependent She is not currently fasting but states that she can return fasting in the next few days to do lipid panel, CMET, A1C at that time. - Microalbumin, urine - losartan (COZAAR) 50 MG tablet; Take 1 tablet (  50 mg total) by mouth daily.  Dispense: 30 tablet; Refill: 0 - COMPLETE METABOLIC PANEL WITH GFR; Future - Hemoglobin A1c; Future  She is on aspirin. Add ARB now. She is on no statin. Will return fasting for lipid panel. She has had muscle cramps with exercise with some statins in the past but is not truly intolerant to all statins so we can further discuss this after we get lipid panel results.  She states that she just recently had eye exam in the last few weeks.  2. Essential hypertension Blood pressure is suboptimal. Since there was some pulmonary issues with ACE inhibitor in the past , will use ARB at this time. Add losartan 50 mg daily.  3. Hyperlipemia - Lipid panel; Future  4. Other seasonal allergic rhinitis  5. Morbid obesity, weight 368, BMI 57.7.  6. OSA (obstructive sleep apnea) She states that she does use the CPAP  7. Asthma due to seasonal allergies  8. Sleep apnea  Preventive care  Mammogram last one was 06/04/2013. Today I told her to go home and call to schedule followup mammogram.  Colonoscopy-she says she knows it was done at [facility]. Says that she knows it was determined she was considering gastric bypass surgery but does not recall the date. She says it did show diverticulosis but thinks it was otherwise normal. Says that she has records of time and will confirm the date and the findings.  Immunizations Influenza vaccine: She already received this-- 06/01/2014 Pneumovax 23 ----she received this 08/25/2005  She is going to return fasting for labs in the next few days and will  return for regular office visit in 3 months or sooner if needed.  Marin Olp [place], [place], [facility] 06/11/2014 2:53 PM

## 2014-06-12 LAB — MICROALBUMIN, URINE: Microalb, Ur: 0.3 mg/dL (ref <2.0)

## 2014-06-15 ENCOUNTER — Ambulatory Visit: Payer: 59 | Admitting: Family Medicine

## 2014-06-17 ENCOUNTER — Telehealth: Payer: Self-pay | Admitting: Family Medicine

## 2014-06-17 MED ORDER — GLUCOSE BLOOD VI STRP: 1.0000 | ORAL_STRIP | Freq: Three times a day (TID) | Status: DC

## 2014-06-17 MED ORDER — ALBUTEROL SULFATE HFA 108 (90 BASE) MCG/ACT IN AERS: 2.0000 | INHALATION_SPRAY | Freq: Four times a day (QID) | RESPIRATORY_TRACT | Status: DC | PRN

## 2014-06-17 MED ORDER — METFORMIN HCL 1000 MG PO TABS: 1000.0000 mg | ORAL_TABLET | Freq: Two times a day (BID) | ORAL | Status: DC

## 2014-06-17 MED ORDER — BAYER MICROLET LANCETS MISC: 1.0000 | Freq: Three times a day (TID) | Status: DC

## 2014-06-17 MED ORDER — BAYER CONTOUR MONITOR W/DEVICE KIT: 1.0000 | PACK | Freq: Three times a day (TID) | Status: DC

## 2014-06-17 NOTE — Telephone Encounter (Signed)
Medication refilled per protocol. 

## 2014-06-26 ENCOUNTER — Telehealth: Payer: Self-pay | Admitting: Family Medicine

## 2014-06-26 MED ORDER — INSULIN PEN NEEDLE 31G X 8 MM MISC: 1.0000 | Freq: Two times a day (BID) | Status: DC

## 2014-06-26 NOTE — Telephone Encounter (Signed)
Medication refilled per protocol. 

## 2014-06-30 ENCOUNTER — Telehealth: Payer: Self-pay | Admitting: Physician Assistant

## 2014-06-30 MED ORDER — INSULIN PEN NEEDLE 31G X 8 MM MISC: 1.0000 | Freq: Two times a day (BID) | Status: DC

## 2014-06-30 NOTE — Telephone Encounter (Signed)
Medication refilled per protocol. 

## 2014-06-30 NOTE — Telephone Encounter (Signed)
Patient is calling to get rx for her insulin pen [facility] should be faxing over a request as well  [phone] [facility]

## 2014-07-14 NOTE — Progress Notes (Signed)
Patient ID: Terri Heath, female   DOB: 1955-11-30, 59 y.o.   MRN: [id number] Reviewed: Agree with the documentation and management of our [place].

## 2014-07-21 ENCOUNTER — Other Ambulatory Visit: Payer: 59

## 2014-07-21 ENCOUNTER — Telehealth: Payer: Self-pay | Admitting: Physician Assistant

## 2014-07-21 DIAGNOSIS — E785 Hyperlipidemia, unspecified: Secondary | ICD-10-CM

## 2014-07-21 DIAGNOSIS — E119 Type 2 diabetes mellitus without complications: Secondary | ICD-10-CM

## 2014-07-21 DIAGNOSIS — Z794 Long term (current) use of insulin: Principal | ICD-10-CM

## 2014-07-21 LAB — COMPLETE METABOLIC PANEL WITH GFR
ALT: 21 U/L (ref 0–35)
AST: 18 U/L (ref 0–37)
Albumin: 3.7 g/dL (ref 3.5–5.2)
Alkaline Phosphatase: 65 U/L (ref 39–117)
BILIRUBIN TOTAL: 0.3 mg/dL (ref 0.2–1.2)
BUN: 12 mg/dL (ref 6–23)
CO2: 27 meq/L (ref 19–32)
CREATININE: 0.76 mg/dL (ref 0.50–1.10)
Calcium: 9.1 mg/dL (ref 8.4–10.5)
Chloride: 101 mEq/L (ref 96–112)
GFR, EST NON AFRICAN AMERICAN: 87 mL/min
GLUCOSE: 210 mg/dL — AB (ref 70–99)
Potassium: 5 mEq/L (ref 3.5–5.3)
Sodium: 136 mEq/L (ref 135–145)
Total Protein: 7.1 g/dL (ref 6.0–8.3)

## 2014-07-21 LAB — LIPID PANEL
CHOLESTEROL: 196 mg/dL (ref 0–200)
HDL: 43 mg/dL (ref >39)
LDL Cholesterol: 135 mg/dL — ABNORMAL HIGH (ref 0–99)
TRIGLYCERIDES: 90 mg/dL (ref <150)
Total CHOL/HDL Ratio: 4.6 Ratio
VLDL: 18 mg/dL (ref 0–40)

## 2014-07-21 LAB — HEMOGLOBIN A1C
HEMOGLOBIN A1C: 8.6 % — AB (ref <5.7)
Mean Plasma Glucose: 200 mg/dL — ABNORMAL HIGH (ref <117)

## 2014-07-21 MED ORDER — LOSARTAN POTASSIUM 50 MG PO TABS: 50.0000 mg | ORAL_TABLET | Freq: Every day | ORAL | Status: DC

## 2014-07-21 NOTE — Telephone Encounter (Signed)
Pt had run out of her losartan.  Has follow up appt next week.  Sent in one refill to hold her over until then

## 2014-07-21 NOTE — Telephone Encounter (Signed)
[phone] (pt states that you can leave a message)  Pt is wanting to speak to you about her BP Medication.

## 2014-07-30 ENCOUNTER — Ambulatory Visit: Payer: 59 | Admitting: Physician Assistant

## 2014-09-03 ENCOUNTER — Encounter: Payer: Self-pay | Admitting: Physician Assistant

## 2014-09-03 ENCOUNTER — Ambulatory Visit (INDEPENDENT_AMBULATORY_CARE_PROVIDER_SITE_OTHER): Payer: 59 | Admitting: Physician Assistant

## 2014-09-03 VITALS — BP 140/90 | HR 80 | Temp 97.8°F | Resp 18 | Wt 353.0 lb

## 2014-09-03 DIAGNOSIS — E119 Type 2 diabetes mellitus without complications: Secondary | ICD-10-CM

## 2014-09-03 DIAGNOSIS — G4733 Obstructive sleep apnea (adult) (pediatric): Secondary | ICD-10-CM

## 2014-09-03 DIAGNOSIS — L03319 Cellulitis of trunk, unspecified: Secondary | ICD-10-CM

## 2014-09-03 DIAGNOSIS — L02219 Cutaneous abscess of trunk, unspecified: Secondary | ICD-10-CM

## 2014-09-03 DIAGNOSIS — G4730 Sleep apnea, unspecified: Secondary | ICD-10-CM

## 2014-09-03 DIAGNOSIS — E785 Hyperlipidemia, unspecified: Secondary | ICD-10-CM

## 2014-09-03 DIAGNOSIS — Z794 Long term (current) use of insulin: Secondary | ICD-10-CM

## 2014-09-03 DIAGNOSIS — I10 Essential (primary) hypertension: Secondary | ICD-10-CM

## 2014-09-03 MED ORDER — CEPHALEXIN 500 MG PO CAPS: 500.0000 mg | ORAL_CAPSULE | Freq: Four times a day (QID) | ORAL | Status: DC

## 2014-09-03 MED ORDER — LOSARTAN POTASSIUM 50 MG PO TABS: 50.0000 mg | ORAL_TABLET | Freq: Every day | ORAL | Status: DC

## 2014-09-03 NOTE — Progress Notes (Signed)
Patient ID: Terri Heath MRN: [id number], DOB: 1956/05/18, 59 y.o. Date of Encounter: @DATE @  Chief Complaint:  Chief Complaint  Patient presents with  . 3 mth check up    has had labs,  is losing weight and needs advise about current medications    HPI: 58 y.o. year old white female  Presents for f/u OV.   She saw me on 06/11/2014 as a new patient to establish care here.  THE FOLLOWING IS COPIED FROM THAT OV NOTE 06/11/2014:   She says that she and her family actually used to come to this practice years ago. She mentions Dr. Joseph Art, Dr. Laurance Flatten, as well as Dr. Darron Doom. She says that she actually followed Dr. Darron Doom when she left here and followed her to [place] and then to [place] in [facility] Says that Dr. Darron Doom recently left [place] so patient was left without a provider so she decided to come back here.  Patient works as a Chief of Staff. 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 She works 3 days a week.  She also goes to the [facility]. She has a piece of paper filled out by them, reporting  Hgb A1c 7.7 on 06/09/14.  I asked when she last saw Dr. Darron Doom and she says probably around January 2015.  Last labs by Dr. Darron Doom were also probably around January.  However as above 06/09/14 A1c was 7.7.   Also I mentioned that her blood pressure is elevated today and she says that it was also in the 150s over 80s at the wellness check just the other day.  She reports that in the past multiple statins caused muscle cramps when she would try to exercise. Specifically recalls Crestor and atorvastatin being some of the medicines that caused this problem.  She also states that she took ACE inhibitor for over 10 years. Remembers when she first started that medicine being told that it would help protect her kidneys from the effects of diabetes. However  says that she then was told at a pulmonary evaluation that they can have adverse effects regarding pulmonary so she stopped it and has been off of ACE inhibitor since.  Says she has lost 10 pounds in this year. Says that last year at this time she was 386 and is now 375.    TODAY--09/03/2014:   Today she reports that she also goes to the wellness clinic at [facility]. Says that they just try to "coach her "regarding diet and exercise and educate her further regarding her diabetes etc. At the end of the visit today I noticed that in the meds and orders section that metformin states that she is not taking. When asked her about this she says that back when she started this diet she developed leg cramps but once she stopped the metformin the leg cramps resolved.  She currently is just using Lantus 10 units every night as a treatment for her diabetes. Says that she only checks her blood sugar every morning fasting and gets between 180-200.  Says that she was on a lot of insulin prior to this weight loss. Says that she has talked to the pharmacy diabetic manager regarding how to adjust her insulin and is saying that she really doesn't know how to adjust things and treat things now that she is losing weight.  She states that she does have an infected area on her "panus"--says she has had similar sites in past. No h/o MRSA.    Past Medical History  Diagnosis Date  . Obesity   . Borderline hypertension   . Hyperlipidemia   . Bilateral shoulder pain   . Diabetes mellitus     Diagnosed in 2000  . Allergy   . Sleep apnea      Home Meds: Outpatient Prescriptions Prior to Visit  Medication Sig Dispense Refill  . acetaminophen (TYLENOL) 500 MG tablet Take 325 mg by mouth every 6 (six) hours as needed. For pain    . albuterol (PROAIR HFA) 108 (90 BASE) MCG/ACT inhaler Inhale 2 puffs into the lungs every 6 (six) hours as needed. wheezing 18 g 2  . aspirin 81 MG chewable tablet Chew 81 mg by mouth  daily.    Marland Kitchen BAYER MICROLET LANCETS lancets 1 each by Other route 3 (three) times daily. Use as instructed 300 each 3  . Blood Glucose Monitoring Suppl (BAYER CONTOUR MONITOR) W/DEVICE KIT 1 kit by Does not apply route 3 (three) times daily. 1 kit 0  . glucose blood (BAYER CONTOUR NEXT TEST) test strip 1 each by Other route 3 (three) times daily. Use as instructed 300 each 3  . Insulin Pen Needle (1ST TIER UNIFINE PENTIPS) 31G X 8 MM MISC 1 each by Does not apply route 2 (two) times daily. 150 each 12  . loratadine (CLARITIN) 10 MG tablet Take 10 mg by mouth daily with breakfast.     . Multiple Vitamin (MULITIVITAMIN WITH MINERALS) TABS Take 1 tablet by mouth daily with breakfast.    . NEEDLE, DISP, 30 G (BD DISP NEEDLES) 30G X 1/2" MISC For use with Solostar pen. Injects twice daily. diag code 250.0 insulin dependent 100 each 11  . Insulin Glargine (LANTUS SOLOSTAR) 100 UNIT/ML SOPN Inject 50 Units into the skin 2 (two) times daily. 24 mL 1  . insulin lispro (HUMALOG PEN) 100 UNIT/ML injection Per sliding scale (used formula #Units = (Blood glucose -140)/40. 21 mL 5  . losartan (COZAAR) 50 MG tablet Take 1 tablet (50 mg total) by mouth daily. (Patient not taking: Reported on 09/03/2014) 30 tablet 0  . metFORMIN (GLUCOPHAGE) 1000 MG tablet Take 1 tablet (1,000 mg total) by mouth 2 (two) times daily with a meal. (Patient not taking: Reported on 09/03/2014) 180 tablet 0   No facility-administered medications prior to visit.    Allergies:  Allergies  Allergen Reactions  . Exenatide   . Lisinopril Other (See Comments)    cramping  . Other Diarrhea    Kale=food  . Statins Other (See Comments)    Leg cramps    History   Social History  . Marital Status: Married    Spouse Name: N/A    Number of Children: N/A  . Years of Education: N/A   Occupational History  . Not on file.   Social History Main Topics  . Smoking status: Former Smoker -- 1.00 packs/day for 15 years    Types: Cigarettes     Quit date: 01/28/1990  . Smokeless tobacco: Never Used  . Alcohol Use: 1.2 oz/week    1 Glasses of wine, 1 Cans of beer per week     Comment: rare  . Drug Use: No  . Sexual Activity: Not Currently   Other Topics Concern  . Not on file   Social History Narrative    Family History  Problem Relation  Age of Onset  . Alcohol abuse Mother   . Arthritis Mother   . Diabetes Mother   . Sleep apnea Mother   . Alcohol abuse Father   . Heart disease Father   . Sleep apnea Sister      Review of Systems:  See HPI for pertinent ROS. All other ROS negative.    Physical Exam: Blood pressure 140/90, pulse 80, temperature 97.8 F (36.6 C), temperature source Oral, resp. rate 18, weight 353 lb (160.12 kg)., Body mass index is 57 kg/(m^2). General: Morbidly Obese WF. Appears in no acute distress. Neck: Supple. No thyromegaly. No lymphadenopathy. No carotid bruits.  Lungs: Clear bilaterally to auscultation without wheezes, rales, or rhonchi. Breathing is unlabored. Heart: RRR with S1 S2. No murmurs, rubs, or gallops. Abdomen: Soft, non-tender, non-distended with normoactive bowel sounds. No hepatomegaly. No rebound/guarding. No obvious abdominal masses. Musculoskeletal:  Strength and tone normal for age. Extremities/Skin: Warm and dry.  No edema Left Mid Abdomen/Panus--Approx 1 inch diameter of pink erythema c/w cellulitis. No abscess.   Diabetic foot exam: Inspection is normal. There are no open sores and no calluses or problem areas. Sensation is intact. 2+ bounding posterior tibial pulses and dorsalis pedis pulses bilaterally. Neuro: Alert and oriented X 3. Moves all extremities spontaneously. Gait is normal. CNII-XII grossly in tact. Psych:  Responds to questions appropriately with a normal affect.     ASSESSMENT AND PLAN:  59 y.o. year old female with    1- "Compliance" Issues  A--at office visit 08/2014 at the end of the visit I noted that in the meds in order section next to the  losartan there was a note that she was not currently taking. I then asked her about this and she says that she took it for one month but then had no refills available without follow-up. Says that she wanted to wait and recheck her blood pressure rather than going coming in for follow-up in getting refill. At the visit 08/2014 she says that she is agreeable to go pick it up and restart the medication if I will send in another refill.  B--at office visit 08/2014 I discussed her cholesterol panel. I discussed that I see in her record that she has had problems with myalgias when taking statins in the past. I then discussed using Zetia. Her answer is that she has used that he had in the past and that also caused muscle cramps. I explained that usually's area does not cause such side effects that usually it simply decreases absorption of cholesterol from the intestine. She said she was not started Saturday or any cholesterol medicine and it has caused problems for her in the past.  C--at office visit 08/2014 at the end of the visit also noted that there was a note stating that she was no longer on metformin. I then asked her about this. She says that when she started her diet and losing weight that she developed leg cramps. Says that once she stopped the metformin the cramps resolved.  D--Mammogram--see note regarding mammogram below.  E--Colonoscopy-----see note regarding colonoscopy below.   1. Cellulitis and abscess of trunk Start Keflex immediately. Follow up if site worsens or does not resolve at completion of antibiotic. - cephALEXin (KEFLEX) 500 MG capsule; Take 1 capsule (500 mg total) by mouth 4 (four) times daily.  Dispense: 28 capsule; Refill: 0     1. Diabetes mellitus type 2, insulin dependent  MicroAlbumin---05/2014--normal.  Today I gave her a blood sugar  log sheet. Told her to night to increase her Lantus by 3 units which would put her at giving 13 units tonight. After that,  each night she is to increase by 1 unit per night. He is to continue to adjust until she gets fasting blood sugars between 80-120. He starts getting blood sugars towards 120 then she can continue at that same amount of Lantus and then adjust accordingly. As she continues to lose weight we can then back off accordingly. Discussed all of this and she voices understanding. She also has understanding of needing to avoid hypoglycemia.  She is on aspirin. On ARB --See #1 above.  She is on no statin.She refuses Stain , Zetia--see #1 above  She had eye exam --approx 04/2014 Diabetic Foot Exam Documented in Epic 08/2014  2. Essential hypertension  Since there was some pulmonary issues with ACE inhibitor in the past, avoid ACE Inhibitor.   -- losartan 50 mg daily.--see # 1 above  3. Hyperlipemia She  returned fasting on 07/21/14 and had lipid panel. LDL was 135.  Today I discussed findings and discussed the fact that I recommended cholesterol medication.  She defers any treatment including any statins or Zetia.  4. Other seasonal allergic rhinitis  5. Morbid obesity, weight 368, BMI 57.7. Good job with weight loss.   6. OSA (obstructive sleep apnea) She states that she does use the CPAP  7. Asthma due to seasonal allergies  8. Sleep apnea  Preventive care  Mammogram last one was 06/04/2013.  05/2014-- I told her to go home and call to schedule followup mammogram. I told her this at the visit 05/2014.  However at visit 08/2014 she had not done this. At that visit I reminded her again and wrote this down for her again.  Colonoscopy-she says she knows it was done at [place]. Says that she knows it was determined she was considering gastric bypass surgery but does not recall the date. She says it did show diverticulosis but thinks it was otherwise normal. Says that she has records of time and will confirm the date and the findings. We had the above discussion at her visit here 05/2014. At  follow-up visit 08/2014 she did not notice information. I discussed it again wrote it down for her again.  Immunizations Influenza vaccine: She already received this-- 06/01/2014 Pneumovax 23 ----she received this 08/25/2005  She is to have follow-up office visit here in 3 months. Follow-up sooner if needed.  Signed, [id number] [place], [place], [facility] 09/03/2014 1:14 PM

## 2014-09-10 ENCOUNTER — Ambulatory Visit: Payer: 59 | Admitting: Physician Assistant

## 2014-10-07 ENCOUNTER — Ambulatory Visit (INDEPENDENT_AMBULATORY_CARE_PROVIDER_SITE_OTHER): Payer: Self-pay | Admitting: Family Medicine

## 2014-10-07 VITALS — BP 152/88 | Wt 333.0 lb

## 2014-10-07 DIAGNOSIS — E118 Type 2 diabetes mellitus with unspecified complications: Secondary | ICD-10-CM

## 2014-10-08 NOTE — Progress Notes (Signed)
Patient presents today for diabetes follow-up as part of the employer-sponsored Link to Wellness program. Current diabetes regimen includes Metformin and Lantus. Patient discontinued Lantus approximately two months ago due to new diet. Patient continues on daily ASA. Patient had a recent follow-up with MD to discuss new diet and discontinuing Lantus. She does not have a follow-up scheduled. Patient comes in today excited about her new diet, but nervous about discontuing Lantus.   Diabetes Assessment: Type of Diabetes: Type 2; Diabetes Education Went through formal class education; checks feet daily; uses glucometer; Sees Diabetes provider 2 times per year; checks blood glucose once daily; does not take medications as prescribed; 7 day CBG average 145; 14 day CBG average 145; MD managing Diabetes Dr. Janett Billow Copland; Lowest CBG 118; hypoglycemia frequency none; takes an aspirin a day; Highest CBG 162; Other Diabetes History: Current med regimen includes Metformin 1,000 mg twice daily. Patient discontinued all lantus after starting low carb/hi fat diet. Due to lack of carbs patient stopped lantus, she was fearful that continuing on lantus would result in lows and would impair her ability to lose weight. Despite major dietary changes patient reports glucose continues to remain in the 200s, with most recent A1c of 9.0. Patient did discuss this change with PCP and PCP advised against this. I am confident that patient will not agree to resume lantus as she is so excited about losing weight. However, she is not opposed to adding another medication as long as it is not an insulin product. I have discussed with her the possibility of adding a DPP-IV inhibitor or a GLP-1 agonist. She will speak with MD regarding this at her next appt. She remains very compliant with metformin. Patient is up to date on eye exam and denies signs or symptoms of neuropathy. She continues testing 1-2 times daily.  Lifestyle Factors: Exercise  - Patient continues exercising at her desk at work, doing walking videos for 30 minutes at a time, three times per day while working.  Diet - Patient has started a low carb, high fat diet, similar to a keto diet. She has been on this diet for approximately 3 months and has lost nearly 50 lbs. She is limiting carbs to 20 grams per day total, none of which are starchy carbs, all carbs coming from vegetables. She is adding calories from fat. Patient is thrilled with her weight loss as she reports she has NEVER lost weight in her life and she has finally found something that works.   Plan: 1) Continue with current dietary plan, avoid "bad" fats such as animal fat, butter, red meats, etc. 2) Continue exercising three times daily while at work 4) Continue testing at least once per day 5) Schedule an appt and Discuss a second anti-diabetic agent with your MD 6) Follow-up in 3 months on Wednesday May 11th @ 2:00 pm

## 2014-10-26 ENCOUNTER — Ambulatory Visit (INDEPENDENT_AMBULATORY_CARE_PROVIDER_SITE_OTHER): Payer: 59 | Admitting: Family Medicine

## 2014-10-26 ENCOUNTER — Encounter: Payer: Self-pay | Admitting: Family Medicine

## 2014-10-26 VITALS — BP 132/78 | HR 68 | Temp 98.1°F | Resp 16 | Wt 342.0 lb

## 2014-10-26 DIAGNOSIS — J209 Acute bronchitis, unspecified: Secondary | ICD-10-CM

## 2014-10-26 MED ORDER — AZITHROMYCIN 250 MG PO TABS: ORAL_TABLET | ORAL | Status: DC

## 2014-10-26 MED ORDER — GUAIFENESIN-CODEINE 100-10 MG/5ML PO SOLN: 10.0000 mL | Freq: Four times a day (QID) | ORAL | Status: DC | PRN

## 2014-10-26 NOTE — Patient Instructions (Signed)
Take antibiotics as prescribed Take cough medicine- do no drive with codiene this can make you sleepy Drink your fluids and rest F/U as needed

## 2014-10-26 NOTE — Progress Notes (Signed)
Patient ID: Terri Heath, female   DOB: 02/08/1956, 59 y.o.   MRN: [id number]   Subjective:    Patient ID: Terri Heath, female    DOB: 1956/05/30, 59 y.o.   MRN: [id number]  Patient presents for Illness  Patient here with 4 days of cough with mild production occasional wheezing. Last night she began to have left ear pain she's also had some sinus pressure and drainage. She has history of asthma and has albuterol she has used this a couple times this weekend. She denies any shortness of breath or chest pain denies any fever. Positive sore throat when her symptoms started but this is now resolved. She is using Robitussin-DM over-the-counter with minimal relief. The cough causes her to have mild incontinence. No known sick contacts CBG have been good despite illness  Review Of Systems: per above  GEN- denies fatigue, fever, weight loss,weakness, recent illness HEENT- denies eye drainage, change in vision, +nasal discharge, CVS- denies chest pain, palpitations RESP- denies SOB, +cough, wheeze ABD- denies N/V, change in stools, abd pain GU- denies dysuria, hematuria, dribbling,+ incontinence MSK- denies joint pain, muscle aches, injury Neuro- denies headache, dizziness, syncope, seizure activity       Objective:    BP 132/78 mmHg  Pulse 68  Temp(Src) 98.1 F (36.7 C) (Oral)  Resp 16  Wt 342 lb (155.13 kg) GEN- NAD, alert and oriented x3 HEENT- PERRL, EOMI, non injected sclera, pink conjunctiva, MMM, oropharynx mild injection, TM clear bilat no effusion, no maxillary sinus tenderness,+ clear  Nasal drainage  Neck- Supple, no LAD CVS- RRR, no murmur RESP-course BS, no wheeze, no rales, no discrete rhonchi, harsh cough EXT- left pedal edema Pulses- Radial 2+         Assessment & Plan:      Problem List Items Addressed This Visit    None    Visit Diagnoses    Acute bronchitis, unspecified organism    -  Primary     Treat for bronchitis, has underlying asthma,  higher risk for PNA due to comorbidites, given zpak, robitussin AC, out of work, CXR if she does not improve. Suspect ear pain from some sinus pressure no sign of infection       Note: This dictation was prepared with Dragon dictation along with smaller phrase technology. Any transcriptional errors that result from this process are unintentional.

## 2014-11-03 NOTE — Progress Notes (Signed)
Patient ID: Terri Heath, female   DOB: 15-Jan-1956, 59 y.o.   MRN: [id number] ATTENDING PHYSICIAN NOTE: I have reviewed the chart and agree with the plan as detailed above. Dorcas Mcmurray MD Pager [phone]

## 2014-12-02 ENCOUNTER — Ambulatory Visit: Payer: 59 | Admitting: Physician Assistant

## 2014-12-03 ENCOUNTER — Ambulatory Visit: Payer: 59 | Admitting: Physician Assistant

## 2015-01-06 ENCOUNTER — Ambulatory Visit: Payer: 59 | Admitting: Pharmacist

## 2015-01-14 ENCOUNTER — Ambulatory Visit: Payer: Self-pay | Admitting: Pharmacist

## 2015-01-21 ENCOUNTER — Ambulatory Visit: Payer: Self-pay | Admitting: Pharmacist

## 2015-01-21 ENCOUNTER — Other Ambulatory Visit: Payer: Self-pay | Admitting: Pharmacist

## 2015-01-21 NOTE — Patient Outreach (Signed)
Mrs. Riede called this morning prior to her scheduled appt to notify me that she wishes to discontinue her enrollment in Link to Wellness.  She feels the LTW program has been of great benefit but that she no longer needs counseling of this type.  She feels she is managing her diabetes well.  She feels there is not a significant financial incentive to continue in LTW given she is only on Metformin and testing supplies.  I have enjoyed having this patient as part the program and have made her aware she can rejoin after a 3 month window if she chooses.    I will notify [facility] of her decision to discontinue.   Tilman Neat, PharmD [facility]  [phone]

## 2015-01-29 ENCOUNTER — Encounter: Payer: Self-pay | Admitting: Family Medicine

## 2015-01-29 ENCOUNTER — Ambulatory Visit (INDEPENDENT_AMBULATORY_CARE_PROVIDER_SITE_OTHER): Payer: 59 | Admitting: Family Medicine

## 2015-01-29 VITALS — BP 140/80 | HR 120 | Temp 98.5°F | Resp 18 | Wt 332.0 lb

## 2015-01-29 DIAGNOSIS — L03319 Cellulitis of trunk, unspecified: Secondary | ICD-10-CM | POA: Diagnosis not present

## 2015-01-29 DIAGNOSIS — L02219 Cutaneous abscess of trunk, unspecified: Secondary | ICD-10-CM

## 2015-01-29 MED ORDER — CEPHALEXIN 500 MG PO CAPS: 500.0000 mg | ORAL_CAPSULE | Freq: Three times a day (TID) | ORAL | Status: DC

## 2015-01-29 MED ORDER — DOXYCYCLINE HYCLATE 100 MG PO TABS: 100.0000 mg | ORAL_TABLET | Freq: Two times a day (BID) | ORAL | Status: DC

## 2015-01-29 NOTE — Progress Notes (Signed)
Subjective:    Patient ID: Terri Heath, female    DOB: [birth year], 59 y.o.   MRN: [id number]  HPI Patient has a large erythematous patch of skin on her lower right pannus/abdomen. It is approximately 10 inches in diameter. It is hot red and tender to the touch. Patient has a history of cellulitis in this area previously. She also reports feeling sick achy and tired. She is tachycardic today. On clinical exam she is in normal sinus rhythm. She denies any palpitations, chest pain, shortness of breath although she does report frequent fevers over the last 24 hours which may explain the tachycardia. She also feels poorly. Her blood pressure today is stable and there is no evidence of sepsis at the present time. Past Medical History  Diagnosis Date  . Obesity   . Borderline hypertension   . Hyperlipidemia   . Bilateral shoulder pain   . Diabetes mellitus     Diagnosed in 2000  . Allergy   . Sleep apnea    Past Surgical History  Procedure Laterality Date  . Cesarean section      x 3  . Breath tek h pylori  09/18/2011    Procedure: BREATH TEK H PYLORI;  Surgeon: Shann Medal, MD;  Location: [facility];  Service: General;  Laterality: N/A;   Current Outpatient Prescriptions on File Prior to Visit  Medication Sig Dispense Refill  . acetaminophen (TYLENOL) 500 MG tablet Take 325 mg by mouth every 6 (six) hours as needed. For pain    . albuterol (PROAIR HFA) 108 (90 BASE) MCG/ACT inhaler Inhale 2 puffs into the lungs every 6 (six) hours as needed. wheezing 18 g 2  . aspirin 81 MG chewable tablet Chew 81 mg by mouth daily.    Marland Kitchen azithromycin (ZITHROMAX) 250 MG tablet Take 2 tablets x  1 day, then 1 tablet daily x 4 days 6 tablet 0  . BAYER MICROLET LANCETS lancets 1 each by Other route 3 (three) times daily. Use as instructed 300 each 3  . Blood Glucose Monitoring Suppl (BAYER CONTOUR MONITOR) W/DEVICE KIT 1 kit by Does not apply route 3 (three) times daily. 1 kit 0  . cetirizine  (ZYRTEC) 10 MG tablet Take 10 mg by mouth daily.    Marland Kitchen glucose blood (BAYER CONTOUR NEXT TEST) test strip 1 each by Other route 3 (three) times daily. Use as instructed 300 each 3  . guaiFENesin-codeine 100-10 MG/5ML syrup Take 10 mLs by mouth every 6 (six) hours as needed for cough. 180 mL 0  . Insulin Glargine (LANTUS SOLOSTAR) 100 UNIT/ML SOPN Inject 50 Units into the skin 2 (two) times daily. 24 mL 1  . insulin lispro (HUMALOG PEN) 100 UNIT/ML injection Per sliding scale (used formula #Units = (Blood glucose -140)/40. 21 mL 5  . Insulin Pen Needle (1ST TIER UNIFINE PENTIPS) 31G X 8 MM MISC 1 each by Does not apply route 2 (two) times daily. 150 each 12  . losartan (COZAAR) 50 MG tablet Take 1 tablet (50 mg total) by mouth daily. 30 tablet 3  . metFORMIN (GLUCOPHAGE) 1000 MG tablet Take 1,000 mg by mouth 2 (two) times daily with a meal.    . Multiple Vitamin (MULITIVITAMIN WITH MINERALS) TABS Take 1 tablet by mouth daily with breakfast.    . NEEDLE, DISP, 30 G (BD DISP NEEDLES) 30G X 1/2" MISC For use with Solostar pen. Injects twice daily. diag code 250.0 insulin dependent 100 each 11   No  current facility-administered medications on file prior to visit.   Allergies  Allergen Reactions  . Exenatide   . Lisinopril Other (See Comments)    cramping  . Other Diarrhea    Kale=food  . Statins Other (See Comments)    Leg cramps   History   Social History  . Marital Status: Married    Spouse Name: N/A  . Number of Children: N/A  . Years of Education: N/A   Occupational History  . Not on file.   Social History Main Topics  . Smoking status: Former Smoker -- 1.00 packs/day for 15 years    Types: Cigarettes    Quit date: 01/28/1990  . Smokeless tobacco: Never Used  . Alcohol Use: 1.2 oz/week    1 Glasses of wine, 1 Cans of beer per week     Comment: rare  . Drug Use: No  . Sexual Activity: Not Currently   Other Topics Concern  . Not on file   Social History Narrative       Review of Systems  All other systems reviewed and are negative.      Objective:   Physical Exam  Cardiovascular: Regular rhythm and normal heart sounds.  Tachycardia present.   Pulmonary/Chest: Effort normal and breath sounds normal. No respiratory distress. She has no wheezes. She has no rales.  Skin: Skin is warm. There is erythema.  Vitals reviewed.         Assessment & Plan:  Cellulitis and abscess of trunk - Plan: cephALEXin (KEFLEX) 500 MG capsule, doxycycline (VIBRA-TABS) 100 MG tablet  Patient has cellulitis on her lower abdomen. I will start the patient on Keflex 500 mg by mouth 3 times a day for 10 days. I will also add doxycycline 100 mg by mouth twice a day for 10 days to cover for possible MRSA as well. Recheck next week or immediately go to the hospital if worsening.

## 2015-02-05 ENCOUNTER — Telehealth: Payer: Self-pay | Admitting: Physician Assistant

## 2015-02-05 NOTE — Telephone Encounter (Signed)
fmla papers from [employer] received on 02-05-15 routed to sabrina

## 2015-02-08 NOTE — Telephone Encounter (Signed)
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 Dennard Schaumann for fill out and signature in blue folder

## 2015-02-09 ENCOUNTER — Telehealth: Payer: Self-pay | Admitting: Physician Assistant

## 2015-02-09 NOTE — Telephone Encounter (Signed)
Duplicate received on 57/89/78 already filled out by provider and returned to me today on my desk

## 2015-02-09 NOTE — Telephone Encounter (Signed)
Received FMLA ppw back on my desk filled out by Dr. Dennard Schaumann , called pt lmtrc in regarding FMLA

## 2015-02-09 NOTE — Telephone Encounter (Signed)
Received FMLA ppw off the fax machine and it has been routed to [place]

## 2015-02-18 ENCOUNTER — Ambulatory Visit: Payer: 59 | Admitting: Family Medicine

## 2015-02-22 ENCOUNTER — Other Ambulatory Visit: Payer: Self-pay

## 2015-02-25 ENCOUNTER — Telehealth: Payer: Self-pay | Admitting: Pulmonary Disease

## 2015-02-25 NOTE — Telephone Encounter (Signed)
Received notice from [facility] that patient needs CPAP supplies Patient has not been seen since 2014 will need appointment Called and left detailed message on voicemail advising patient that she needs to schedule appointment with Dr. Elsworth Soho to obtain CPAP supplies  Nothing further needed.

## 2015-04-01 ENCOUNTER — Ambulatory Visit: Payer: 59 | Admitting: Adult Health

## 2015-04-15 ENCOUNTER — Other Ambulatory Visit: Payer: Self-pay | Admitting: *Deleted

## 2015-04-15 DIAGNOSIS — E119 Type 2 diabetes mellitus without complications: Secondary | ICD-10-CM

## 2015-04-15 MED ORDER — METFORMIN HCL 1000 MG PO TABS: 1000.0000 mg | ORAL_TABLET | Freq: Two times a day (BID) | ORAL | Status: DC

## 2015-04-16 ENCOUNTER — Ambulatory Visit: Payer: 59 | Admitting: Adult Health

## 2015-04-16 ENCOUNTER — Ambulatory Visit: Payer: 59 | Admitting: Family Medicine

## 2015-04-20 ENCOUNTER — Encounter: Payer: Self-pay | Admitting: Family Medicine

## 2015-04-20 ENCOUNTER — Ambulatory Visit (INDEPENDENT_AMBULATORY_CARE_PROVIDER_SITE_OTHER): Payer: 59 | Admitting: Family Medicine

## 2015-04-20 VITALS — BP 132/78 | HR 80 | Temp 98.0°F | Resp 20 | Ht 67.0 in | Wt 322.0 lb

## 2015-04-20 DIAGNOSIS — E1165 Type 2 diabetes mellitus with hyperglycemia: Secondary | ICD-10-CM | POA: Diagnosis not present

## 2015-04-20 DIAGNOSIS — IMO0002 Reserved for concepts with insufficient information to code with codable children: Secondary | ICD-10-CM

## 2015-04-20 LAB — HEMOGLOBIN A1C, FINGERSTICK: HEMOGLOBIN A1C, FINGERSTICK: 9.5 % — AB (ref <5.7)

## 2015-04-20 NOTE — Progress Notes (Signed)
Subjective:    Patient ID: Terri Heath, female    DOB: 07/13/1956, 59 y.o.   MRN: [id number]  Diabetes  6/16 Patient has a large erythematous patch of skin on her lower right pannus/abdomen. It is approximately 10 inches in diameter. It is hot red and tender to the touch. Patient has a history of cellulitis in this area previously. She also reports feeling sick achy and tired. She is tachycardic today. On clinical exam she is in normal sinus rhythm. She denies any palpitations, chest pain, shortness of breath although she does report frequent fevers over the last 24 hours which may explain the tachycardia. She also feels poorly. Her blood pressure today is stable and there is no evidence of sepsis at the present time. At that time, my plan was: Patient has cellulitis on her lower abdomen. I will start the patient on Keflex 500 mg by mouth 3 times a day for 10 days. I will also add doxycycline 100 mg by mouth twice a day for 10 days to cover for possible MRSA as well. Recheck next week or immediately go to the hospital if worsening.  04/20/15 Patient is here today for follow-up. Patient stopped all of her insulin 8 months ago. She attributed significant weight gain to her insulin use. Since discontinuing the insulin the patient has lost approximately 20 pounds. She denies polyuria, polydipsia, or blurred vision. She has been eating a modified Atkins ketogenic diet. She is very resistant to trying insulin or any medication. She also discontinued losartan due to cramps. She is here today for follow-up. Past Medical History  Diagnosis Date  . Obesity   . Borderline hypertension   . Hyperlipidemia   . Bilateral shoulder pain   . Diabetes mellitus     Diagnosed in 2000  . Allergy   . Sleep apnea    Past Surgical History  Procedure Laterality Date  . Cesarean section      x 3  . Breath tek h pylori  09/18/2011    Procedure: BREATH TEK H PYLORI;  Surgeon: Shann Medal, MD;  Location: [facility];  Service: General;  Laterality: N/A;   Current Outpatient Prescriptions on File Prior to Visit  Medication Sig Dispense Refill  . albuterol (PROAIR HFA) 108 (90 BASE) MCG/ACT inhaler Inhale 2 puffs into the lungs every 6 (six) hours as needed. wheezing 18 g 2  . aspirin 81 MG chewable tablet Chew 81 mg by mouth daily.    . Blood Glucose Monitoring Suppl (BAYER CONTOUR MONITOR) W/DEVICE KIT 1 kit by Does not apply route 3 (three) times daily. 1 kit 0  . cetirizine (ZYRTEC) 10 MG tablet Take 10 mg by mouth daily.    Marland Kitchen glucose blood (BAYER CONTOUR NEXT TEST) test strip 1 each by Other route 3 (three) times daily. Use as instructed 300 each 3  . losartan (COZAAR) 50 MG tablet Take 1 tablet (50 mg total) by mouth daily. 30 tablet 3  . metFORMIN (GLUCOPHAGE) 1000 MG tablet Take 1 tablet (1,000 mg total) by mouth 2 (two) times daily with a meal. 180 tablet 2  . Multiple Vitamin (MULITIVITAMIN WITH MINERALS) TABS Take 1 tablet by mouth daily with breakfast.     No current facility-administered medications on file prior to visit.   Allergies  Allergen Reactions  . Exenatide   . Lisinopril Other (See Comments)    cramping  . Other Diarrhea    Kale=food  . Statins Other (See Comments)  Leg cramps   Social History   Social History  . Marital Status: Married    Spouse Name: N/A  . Number of Children: N/A  . Years of Education: N/A   Occupational History  . Not on file.   Social History Main Topics  . Smoking status: Former Smoker -- 1.00 packs/day for 15 years    Types: Cigarettes    Quit date: 01/28/1990  . Smokeless tobacco: Never Used  . Alcohol Use: 1.2 oz/week    1 Glasses of wine, 1 Cans of beer per week     Comment: rare  . Drug Use: No  . Sexual Activity: Not Currently   Other Topics Concern  . Not on file   Social History Narrative      Review of Systems  All other systems reviewed and are negative.      Objective:   Physical Exam    Cardiovascular: Normal rate, regular rhythm and normal heart sounds.   Pulmonary/Chest: Effort normal and breath sounds normal. No respiratory distress. She has no wheezes. She has no rales.  Vitals reviewed.         Assessment & Plan:  Diabetes mellitus type II, uncontrolled - Plan: Hemoglobin A1C, fingerstick, COMPLETE METABOLIC PANEL WITH GFR, CBC with Differential/Platelet, Lipid panel, Hemoglobin A1c, Microalbumin, urine  fingerstick hemoglobin A1c today was 9.5. I had a long discussion with the patient regarding her options. I recommended resumption of insulin that the patient is resistant to wanting to do this. Therefore we will start the patient on Victoza and gradually titrate the patient to 1.8 per day. I warned the patient about possible nausea. I would like to recheck a hemoglobin A1c in 3 months.  At that point if the patient is not responding, I would consider resuming insulin. She has failed invokana in the past due to palpitations.  She doesn't want any meds that cause weight gain.  Also recommended she take metformin twice a day. At the present time she is only been taking it once a day.

## 2015-04-21 ENCOUNTER — Other Ambulatory Visit: Payer: 59

## 2015-04-21 LAB — COMPLETE METABOLIC PANEL WITH GFR
ALT: 22 U/L (ref 6–29)
AST: 17 U/L (ref 10–35)
Albumin: 3.9 g/dL (ref 3.6–5.1)
Alkaline Phosphatase: 73 U/L (ref 33–130)
BUN: 15 mg/dL (ref 7–25)
CO2: 25 mmol/L (ref 20–31)
Calcium: 8.9 mg/dL (ref 8.6–10.4)
Chloride: 100 mmol/L (ref 98–110)
Creat: 0.54 mg/dL (ref 0.50–1.05)
GFR, Est African American: >89 mL/min (ref >=60)
Glucose, Bld: 205 mg/dL — ABNORMAL HIGH (ref 70–99)
Potassium: 4.3 mmol/L (ref 3.5–5.3)
SODIUM: 137 mmol/L (ref 135–146)
TOTAL PROTEIN: 7.3 g/dL (ref 6.1–8.1)
Total Bilirubin: 0.5 mg/dL (ref 0.2–1.2)

## 2015-04-21 LAB — CBC WITH DIFFERENTIAL/PLATELET
Basophils Absolute: 0 10*3/uL (ref 0.0–0.1)
Basophils Relative: 0 % (ref 0–1)
Eosinophils Absolute: 0.2 10*3/uL (ref 0.0–0.7)
Eosinophils Relative: 2 % (ref 0–5)
HEMATOCRIT: 44.1 % (ref 36.0–46.0)
HEMOGLOBIN: 14.5 g/dL (ref 12.0–15.0)
Lymphocytes Relative: 22 % (ref 12–46)
Lymphs Abs: 2.4 10*3/uL (ref 0.7–4.0)
MCH: 27.3 pg (ref 26.0–34.0)
MCHC: 32.9 g/dL (ref 30.0–36.0)
MCV: 83.1 fL (ref 78.0–100.0)
MONOS PCT: 5 % (ref 3–12)
MPV: 9 fL (ref 8.6–12.4)
Monocytes Absolute: 0.6 10*3/uL (ref 0.1–1.0)
NEUTROS ABS: 7.9 10*3/uL — AB (ref 1.7–7.7)
NEUTROS PCT: 71 % (ref 43–77)
Platelets: 207 10*3/uL (ref 150–400)
RBC: 5.31 MIL/uL — ABNORMAL HIGH (ref 3.87–5.11)
RDW: 15 % (ref 11.5–15.5)
WBC: 11.1 10*3/uL — ABNORMAL HIGH (ref 4.0–10.5)

## 2015-04-21 LAB — LIPID PANEL
Cholesterol: 226 mg/dL — ABNORMAL HIGH (ref 125–200)
HDL: 53 mg/dL (ref >=46)
LDL CALC: 153 mg/dL — AB (ref <130)
Total CHOL/HDL Ratio: 4.3 Ratio (ref <=5.0)
Triglycerides: 98 mg/dL (ref <150)
VLDL: 20 mg/dL (ref <30)

## 2015-04-21 LAB — HEMOGLOBIN A1C
HEMOGLOBIN A1C: 9.6 % — AB (ref <5.7)
MEAN PLASMA GLUCOSE: 229 mg/dL — AB (ref <117)

## 2015-04-22 ENCOUNTER — Encounter: Payer: Self-pay | Admitting: Family Medicine

## 2015-04-22 LAB — MICROALBUMIN, URINE: MICROALB UR: 0.6 mg/dL (ref <2.0)

## 2015-04-22 MED ORDER — PRAVASTATIN SODIUM 20 MG PO TABS: 20.0000 mg | ORAL_TABLET | Freq: Every day | ORAL | Status: DC

## 2015-04-22 NOTE — Telephone Encounter (Signed)
Med sent to pharm 

## 2015-04-28 ENCOUNTER — Other Ambulatory Visit: Payer: Self-pay | Admitting: Pulmonary Disease

## 2015-04-28 DIAGNOSIS — G4733 Obstructive sleep apnea (adult) (pediatric): Secondary | ICD-10-CM

## 2015-04-28 DIAGNOSIS — Z9989 Dependence on other enabling machines and devices: Principal | ICD-10-CM

## 2015-04-29 ENCOUNTER — Encounter: Payer: Self-pay | Admitting: Pulmonary Disease

## 2015-04-29 ENCOUNTER — Ambulatory Visit (INDEPENDENT_AMBULATORY_CARE_PROVIDER_SITE_OTHER): Payer: 59 | Admitting: Pulmonary Disease

## 2015-04-29 VITALS — BP 136/86 | HR 95 | Temp 97.9°F | Ht 67.0 in | Wt 319.0 lb

## 2015-04-29 DIAGNOSIS — J45909 Unspecified asthma, uncomplicated: Secondary | ICD-10-CM

## 2015-04-29 DIAGNOSIS — G4733 Obstructive sleep apnea (adult) (pediatric): Secondary | ICD-10-CM | POA: Diagnosis not present

## 2015-04-29 NOTE — Patient Instructions (Signed)
Congratulations on your weight loss Your CPAP is set at 12 cm

## 2015-04-29 NOTE — Assessment & Plan Note (Signed)
Congratulations on your weight loss Your CPAP is set at 12 cm  Weight loss encouraged, compliance with goal of at least 4-6 hrs every night is the expectation. Advised against medications with sedative side effects Cautioned against driving when sleepy - understanding that sleepiness will vary on a day to day basis

## 2015-04-29 NOTE — Assessment & Plan Note (Signed)
Ok to [employer] Albuterol prn

## 2015-04-29 NOTE — Progress Notes (Signed)
   Subjective:    Patient ID: Terri Heath, female    DOB: May 17, 1956, 59 y.o.   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 Sleep Apnea    Wearing CPAP every night; needs to renew CPAP supplies.    Last OV 11/05/2012 Lost 70 lbs since last visit - on modified atkins' diet All parameters better but HbA1C & LDL still high - 9 & 133 Download 03/2015 >> no residuals on 12 cm, good usage, no leak Stopped using dulera , sporadic albuterol usage        Mobile home, 2 dogs, husband smokes, carpets Compliant with CPAP -mask ok , pressure ok  Labs 9/13 -HbA1c 8.9, no eosinophils 3/10 CXR no infx, VQ scan neg  Significant tests/ events  PSG 12/2007 (335 lbs) showed AHI 26/h with severe in REM, moderate PLMs. She has gained about 30 pounds since the study.  Download 10/8 -07/03/11 >> good complaince ,GOod control of events on 12 cm  SPirometry showed FEv1 64% with ratio of 72, FVC was 71% - mild restriction   Review of Systems neg for any significant sore throat, dysphagia, itching, sneezing, nasal congestion or excess/ purulent secretions, fever, chills, sweats, unintended wt loss, pleuritic or exertional cp, hempoptysis, orthopnea pnd or change in chronic leg swelling. Also denies presyncope, palpitations, heartburn, abdominal pain, nausea, vomiting, diarrhea or change in bowel or urinary habits, dysuria,hematuria, rash, arthralgias, visual complaints, headache, numbness weakness or ataxia.     Objective:   Physical Exam  Gen. Pleasant, obese, in no distress ENT - no lesions, no post nasal drip Neck: No JVD, no thyromegaly, no carotid bruits Lungs: no use of accessory muscles, no dullness to percussion, decreased without rales or rhonchi  Cardiovascular: Rhythm regular, heart sounds  normal, no murmurs or  gallops, no peripheral edema Musculoskeletal: No deformities, no cyanosis or clubbing , no tremors       Assessment & Plan:

## 2015-05-17 ENCOUNTER — Encounter: Payer: Self-pay | Admitting: Pulmonary Disease

## 2015-05-18 ENCOUNTER — Ambulatory Visit (INDEPENDENT_AMBULATORY_CARE_PROVIDER_SITE_OTHER): Payer: Self-pay | Admitting: Family Medicine

## 2015-05-18 ENCOUNTER — Encounter: Payer: Self-pay | Admitting: Pharmacist

## 2015-05-18 VITALS — BP 138/86 | Wt 320.0 lb

## 2015-05-18 DIAGNOSIS — E118 Type 2 diabetes mellitus with unspecified complications: Secondary | ICD-10-CM

## 2015-05-18 NOTE — Patient Instructions (Signed)
1)  Continue to maintain dietary changes, great job with changes here! 2)  Continue to maintain exercise 3)  Schedule follow-up with Dr. Dennard Schaumann in December, consider resuming Lantus 4)  Great job with weight loss!! 5)  Continue testing regularly 6)  Follow-up in 3 months on Wednesday Jan 4th @ 9:30 am

## 2015-05-18 NOTE — Progress Notes (Signed)
Subjective:  Patient is a 59 yo female with type 2 diabetes who presents today to resume enrollment in the Link to Wellness program. Current diabetes regimen includes Metformin. Patient is not currently taking ASA, ACEi, and statin per patient preference.  Most recent MD follow-up was August 2016. Patient does not have a pending appt but will schedule a 3 month follow-up for December.   Of note, since discontinuing LTW program several months ago, pt has established care with a new MD.  She now sees  Dr. Jenna Luo with [facility].  She has seen him twice now and will follow-up again in December.  At this visit full labwork was done, LDL slightly elevated and A1c elevated.  Victoza was also prescribed and patient tried one dose after which she experienced flu-like symptoms similar to those experienced when taking Byetta.  Patient discontinued and does not wish to try any other injectable incretin mimetics.  Pravastatin was also prescribed by patient has elected not to start this at this time.  I have encouraged her to consider it and have discussed other statin options as well.     Diabetes Assessment:  Changes to diabetes regimen include discontinuing all medications except Metformin. She discontinued Lantus and Humalog after starting a ketogenic diet as she has lost approximately 60 lbs and was really hoping to not need insulin.  We have discussed resuming lantus and I am hopeful she will discuss with her PCP at next appt.  Patient does maintain good medication compliance with metformin. Most recent A1c was 9.0% (aug 2016) which is exceeding goal of less than 7%.  Weight has decreased significantly by 60 lbs since last visit.  Patient uses two different meters, a trueresult at work and a one touch at home.  After finishing current supplies she will switch to [employer] at home and True[employer]rix at work.  She is testing 1-3 times daily, mostly in the AM, either fasting or after  breakfast.  In addition she is testing when symptomatic and before bed.  Hypoglycemia is rare. Patient does not demonstrate appropriate correction of hypoglycemia.  Patient reports signs and symptoms of neuropathy including some intermittent numbness on left foot, 3-4th toes.  Resolves with exercise or increased movement.  No symptoms of foot infection.  However, patient continues to have recurrent cellultis of lower abdomen for which she was last treated in June 2016.  Patient is up to date on eye and dental exams.       Lifestyle Assessment:  Diet - Pt continues on ketogenic diet including very low carb, with nearly all carbs coming from vegetables.  Very little bread, potatoe, rice, or pasta.  She is very pleased with herself that she has been able to adhere to this diet for approximately 8 months.  She is managing portion control.  See dietary recall for details.  She also uses 3-4 different resources including dietdoctor.com, keto with kristie, and Dr. Enrigue Catena with [facility].    Exercise - She continues taking walk breaks at her desk at work (3 days per week), 6-7 times daily for ~10 minutes each time.  She walks in place either while working or she listens to music.  On her off days she walks at [employer] or [employer].   Plan and Goals:  1)  Continue to maintain dietary changes, great job with changes here! 2)  Continue to maintain exercise 3)  Schedule follow-up with Dr. Dennard Schaumann in December, consider resuming Lantus 4)  Great job with weight loss!! 5)  Continue testing regularly 6)  Follow-up in 3 months on Wednesday Jan 4th @ 9:30 am   Great to see you today!

## 2015-06-14 NOTE — Progress Notes (Signed)
I reviewed this chart and agree with the pharmacist's recommendation.  

## 2015-09-01 ENCOUNTER — Ambulatory Visit: Payer: 59 | Admitting: Pharmacist

## 2015-09-06 ENCOUNTER — Telehealth: Payer: 59 | Admitting: Family

## 2015-09-06 DIAGNOSIS — J019 Acute sinusitis, unspecified: Secondary | ICD-10-CM

## 2015-09-06 MED ORDER — AMOXICILLIN-POT CLAVULANATE 875-125 MG PO TABS: 1.0000 | ORAL_TABLET | Freq: Two times a day (BID) | ORAL | Status: DC

## 2015-09-06 NOTE — Progress Notes (Signed)

## 2015-09-07 MED FILL — AMOX TR-K CLV 875-125 MG TA: 875-125 | 7 days supply | Qty: 14 | Fill #0

## 2015-09-08 ENCOUNTER — Ambulatory Visit: Payer: 59 | Admitting: Pharmacist

## 2015-09-16 ENCOUNTER — Ambulatory Visit: Payer: 59 | Admitting: Pharmacist

## 2015-09-21 ENCOUNTER — Encounter: Payer: Self-pay | Admitting: Pharmacist

## 2015-09-21 ENCOUNTER — Ambulatory Visit (INDEPENDENT_AMBULATORY_CARE_PROVIDER_SITE_OTHER): Payer: Self-pay | Admitting: Family Medicine

## 2015-09-21 VITALS — BP 136/80 | Wt 318.0 lb

## 2015-09-21 DIAGNOSIS — E118 Type 2 diabetes mellitus with unspecified complications: Secondary | ICD-10-CM

## 2015-09-21 MED FILL — TRUE METRIX GLUCOSE TEST ST: 90 days supply | Qty: 100 | Fill #0

## 2015-09-21 MED FILL — TRUEplus LANCETS 30G MISC: 90 days supply | Qty: 100 | Fill #0

## 2015-09-21 NOTE — Progress Notes (Signed)
Subjective:  Patient is a 60 yo female with type 2 diabetes who presents today for 3 mo follow-up in the Link to Wellness program. Current diabetes regimen includes Metformin. Patient is not currently taking ASA, ACEi, and statin per patient preference.  Most recent MD follow-up was with Dr. Jenna Heath in August 2016. Patient does not have a pending appt but plans to schedule a follow-up for February or March.   Diabetes Assessment:  Patient continues on Metformin alone.  She has discontinued nearly all other meds according to her preference.  She is very firm about her decision NOT to use insulin and believes she can manage with diet alone.  We have discussed insulin use and the necessity of it in certain patients and I am hopeful she will discuss with her MD.  Patient does maintain good medication compliance with metformin. Most recent A1c was 10.3% today via POC (prev 9.0% aug 2016) which is exceeding goal of less than 7%.  Weight has decreased by another 2 lbs since last visit.  Patient has been given a TrueMetrix meter today.  She is not testing regularly at this time, 1-2 times weekly, fasting only.  According to her report, fasting is always >200.  She attributes this to [facility], which could play a role, but there are not enough readings to determine cause of this hyperglycemia.  Hypoglycemia is rare.  Patient reports signs and symptoms of neuropathy including some intermittent numbness on left foot, 3-4th toes, although this has not worsened and remains much the same. No signs of infection.  Patient has a history of recurrent abdominal cellultis but this is now resolved and no current infection.  Patient is up to date on eye and dental exams.     Dental exam up to date last in Dec, every 6 month Eye exam due again in Feb  Lifestyle Assessment:  Diet - Pt continues on low carb diet.  Very little bread, potatoe, rice, or pasta.  She is very pleased with herself that she has been able  to adhere to this diet for the past year.  She is managing portion control.  See dietary recall for details.    Exercise - She continues taking walk breaks at her desk at work (3 days per week), walk videos are ~30 minutes.  She walks in place either while working or she listens to music.  On her off days she walks while out running errands or shopping.  I have encouraged her to take an extra lap around the store before she shops.     Plan and Goals:  1)  Continue to maintain dietary changes, great job with changes here! 2)  Continue to maintain exercise 3)  Schedule follow-up with Dr. Dennard Heath 4)  Use [employer] for documenting exercise and LTW visits 5)  Attempt to increase testing to three times weekly 6)  Follow-up in 3 months on Monday, April 24th @ 3:00 with Terri Heath to see you today!

## 2015-09-21 NOTE — Patient Instructions (Signed)
1)  Continue to maintain dietary changes, great job with changes here! 2)  Continue to maintain exercise 3)  Schedule follow-up with Dr. Dennard Schaumann 4)  Use [employer] for documenting exercise and LTW visits 5)  Attempt to increase testing to three times weekly 6)  Follow-up in 3 months on Monday, April 24th @ 3:00 with Terri Heath  A1c 10.3% (via point of care testing)  Great to see you today!

## 2015-10-01 NOTE — Progress Notes (Signed)
I have reviewed this pharmacist's note and agree  

## 2015-10-21 ENCOUNTER — Other Ambulatory Visit: Payer: Self-pay

## 2015-10-21 DIAGNOSIS — Z1231 Encounter for screening mammogram for malignant neoplasm of breast: Secondary | ICD-10-CM

## 2015-10-27 ENCOUNTER — Ambulatory Visit
Admission: RE | Admit: 2015-10-27 | Discharge: 2015-10-27 | Disposition: A | Discharge status: Home / Self Care | Payer: 59 | Source: Ambulatory Visit

## 2015-10-27 ENCOUNTER — Other Ambulatory Visit: Payer: 59

## 2015-10-27 DIAGNOSIS — H524 Presbyopia: Secondary | ICD-10-CM | POA: Diagnosis not present

## 2015-10-27 DIAGNOSIS — I10 Essential (primary) hypertension: Secondary | ICD-10-CM | POA: Diagnosis not present

## 2015-10-27 DIAGNOSIS — E785 Hyperlipidemia, unspecified: Secondary | ICD-10-CM | POA: Diagnosis not present

## 2015-10-27 DIAGNOSIS — Z1231 Encounter for screening mammogram for malignant neoplasm of breast: Secondary | ICD-10-CM

## 2015-10-27 DIAGNOSIS — H52223 Regular astigmatism, bilateral: Secondary | ICD-10-CM | POA: Diagnosis not present

## 2015-10-27 DIAGNOSIS — Z79899 Other long term (current) drug therapy: Secondary | ICD-10-CM

## 2015-10-27 DIAGNOSIS — E118 Type 2 diabetes mellitus with unspecified complications: Secondary | ICD-10-CM | POA: Diagnosis not present

## 2015-10-27 DIAGNOSIS — E113293 Type 2 diabetes mellitus with mild nonproliferative diabetic retinopathy without macular edema, bilateral: Secondary | ICD-10-CM | POA: Diagnosis not present

## 2015-10-27 LAB — CBC WITH DIFFERENTIAL/PLATELET
Basophils Absolute: 0.1 10*3/uL (ref 0.0–0.1)
Basophils Relative: 1 % (ref 0–1)
Eosinophils Absolute: 0.3 10*3/uL (ref 0.0–0.7)
Eosinophils Relative: 4 % (ref 0–5)
HEMATOCRIT: 45.3 % (ref 36.0–46.0)
HEMOGLOBIN: 14.5 g/dL (ref 12.0–15.0)
LYMPHS ABS: 2.7 10*3/uL (ref 0.7–4.0)
LYMPHS PCT: 40 % (ref 12–46)
MCH: 27.2 pg (ref 26.0–34.0)
MCHC: 32 g/dL (ref 30.0–36.0)
MCV: 85 fL (ref 78.0–100.0)
MONOS PCT: 6 % (ref 3–12)
MPV: 8.9 fL (ref 8.6–12.4)
Monocytes Absolute: 0.4 10*3/uL (ref 0.1–1.0)
NEUTROS ABS: 3.3 10*3/uL (ref 1.7–7.7)
NEUTROS PCT: 49 % (ref 43–77)
Platelets: 250 10*3/uL (ref 150–400)
RBC: 5.33 MIL/uL — ABNORMAL HIGH (ref 3.87–5.11)
RDW: 14.1 % (ref 11.5–15.5)
WBC: 6.8 10*3/uL (ref 4.0–10.5)

## 2015-10-27 LAB — LIPID PANEL
CHOLESTEROL: 226 mg/dL — AB (ref 125–200)
HDL: 57 mg/dL (ref >=46)
LDL Cholesterol: 146 mg/dL — ABNORMAL HIGH (ref <130)
TRIGLYCERIDES: 117 mg/dL (ref <150)
Total CHOL/HDL Ratio: 4 Ratio (ref <=5.0)
VLDL: 23 mg/dL (ref <30)

## 2015-10-27 LAB — COMPREHENSIVE METABOLIC PANEL
ALK PHOS: 81 U/L (ref 33–130)
ALT: 27 U/L (ref 6–29)
AST: 21 U/L (ref 10–35)
Albumin: 3.8 g/dL (ref 3.6–5.1)
BUN: 13 mg/dL (ref 7–25)
CO2: 25 mmol/L (ref 20–31)
CREATININE: 0.76 mg/dL (ref 0.50–1.05)
Calcium: 8.8 mg/dL (ref 8.6–10.4)
Chloride: 100 mmol/L (ref 98–110)
GLUCOSE: 243 mg/dL — AB (ref 70–99)
POTASSIUM: 4.7 mmol/L (ref 3.5–5.3)
Sodium: 138 mmol/L (ref 135–146)
TOTAL PROTEIN: 7.3 g/dL (ref 6.1–8.1)
Total Bilirubin: 0.3 mg/dL (ref 0.2–1.2)

## 2015-10-27 LAB — HEMOGLOBIN A1C
HEMOGLOBIN A1C: 10 % — AB (ref <5.7)
MEAN PLASMA GLUCOSE: 240 mg/dL — AB (ref <117)

## 2015-10-29 ENCOUNTER — Ambulatory Visit: Payer: 59 | Admitting: Family Medicine

## 2015-12-06 MED FILL — metFORMIN HCL 1000 MG TABS: 1000 | 90 days supply | Qty: 180 | Fill #2

## 2015-12-20 ENCOUNTER — Ambulatory Visit: Payer: Self-pay | Admitting: Pharmacist

## 2016-01-03 ENCOUNTER — Encounter: Payer: Self-pay | Admitting: Pharmacist

## 2016-01-03 ENCOUNTER — Other Ambulatory Visit: Payer: Self-pay | Admitting: Pharmacist

## 2016-01-03 VITALS — BP 144/100 | Wt 323.0 lb

## 2016-01-03 DIAGNOSIS — E119 Type 2 diabetes mellitus without complications: Secondary | ICD-10-CM

## 2016-01-03 NOTE — Patient Instructions (Signed)
Plan/Goals for Next Visit:  1. Make an appointment with Dr Dennard Schaumann for followup PCP visit.  2. Discuss Januvia with Dr Dennard Schaumann at her next PCP visit.  3. Followup with bariatric surgery about options following your 60 lb weight loss over the past 2 years 4. Continue your low carbohydrate diet 5. Try to increase exercise in the pool by trying new pool strength exercises  Next appointment to see me is: Monday 03/06/16 at 11 am.

## 2016-01-03 NOTE — Patient Outreach (Signed)
Subjective:  Patient presents today for 3 month diabetes follow-up as part of the employer-sponsored Link to Wellness program.  Current diabetes regimen includes metformin 1000 mg BID  Patient also continues on ARB.  Most recent MD follow-up was January 2017  Patient reports missing her appt with her PCP in March.  She reports no med changes or major health changes at this time.   She has a history of being on insulin in the past but stopped taking it due to weight gain. She reports being on a low carbohydrate/high fat diet and a ~60 lb weight loss over the past 2 years.  She has tried GLP1 and SGLT2 inhibitors in the past but with Invokana she had palpitations and with exenatide and liraglutide she did have flu like symptoms.   She states that she had planned to get bariatric surgery but her MD wanted her to attempt to lose weight and she has not had follow-up with bariatric surgery.   She denies hypoglycemia and reports not checking her BG because she knows it will be high and only checks her BG ~1x/week. She reports it is >180 most of the time.    Reports not taking her ARB or Statin due to her having "borderline" values of LDL and BP.    Reports checking her feet daily and denies cuts/scrapes.  She does wear shoes and wears compression stockings every day.    Assessment:  Diabetes: Most recent A1C was 10.0% which is exceeding goal of less than 7%. Weight is increased from last visit with me.   Physical Activity-  Walks in her pool 2 hrs per day around 2 days per week.   Nutrition-  Is on a low carbohydrate diet and reports that she tries to avoid any carbohydrates at all.  Breakfast - Eggs, bacon Lunch - Cauliflower, broccoli, brussel sprouts, and a meat.  Does sometimes have 1/2 cup of sweet potatoes Snacks: Cottage cheese, string cheese, nuts (walnuts, peanuts)  Follow up with me in 2 months.    Plan/Goals for Next Visit:  1. Make an appointment with Dr Dennard Schaumann for followup  PCP visit.  2. Discuss Januvia with Dr Dennard Schaumann at her next PCP visit.  3. Followup with bariatric surgery about options following your 60 lb weight loss over the past 2 years 4. Continue your low carbohydrate diet 5. Try to increase exercise in the pool by trying new pool strength exercises  Next appointment to see me is: Monday 03/06/16 at 11 am.

## 2016-01-06 DIAGNOSIS — G4733 Obstructive sleep apnea (adult) (pediatric): Secondary | ICD-10-CM | POA: Diagnosis not present

## 2016-01-10 ENCOUNTER — Ambulatory Visit: Payer: 59 | Admitting: Family Medicine

## 2016-01-12 ENCOUNTER — Encounter: Payer: Self-pay | Admitting: Physician Assistant

## 2016-01-19 ENCOUNTER — Ambulatory Visit (HOSPITAL_COMMUNITY)
Admission: EM | Admit: 2016-01-19 | Discharge: 2016-01-19 | Disposition: A | Discharge status: Home / Self Care | Payer: 59 | Attending: Family Medicine | Admitting: Family Medicine

## 2016-01-19 ENCOUNTER — Encounter (HOSPITAL_COMMUNITY): Payer: Self-pay | Admitting: Emergency Medicine

## 2016-01-19 DIAGNOSIS — L03311 Cellulitis of abdominal wall: Secondary | ICD-10-CM

## 2016-01-19 MED ORDER — LIDOCAINE HCL (PF) 2 % IJ SOLN
INTRAMUSCULAR | Status: AC
  Filled 2016-01-19: qty 2

## 2016-01-19 MED ORDER — CEFTRIAXONE SODIUM 1 G IJ SOLR
1.0000 g | Freq: Once | INTRAMUSCULAR | Status: AC
  Administered 2016-01-19: 1 g via INTRAMUSCULAR

## 2016-01-19 MED ORDER — CEPHALEXIN 500 MG PO CAPS: 500.0000 mg | ORAL_CAPSULE | Freq: Four times a day (QID) | ORAL | Status: DC

## 2016-01-19 MED ORDER — CEFTRIAXONE SODIUM 1 G IJ SOLR
INTRAMUSCULAR | Status: AC
  Filled 2016-01-19: qty 10

## 2016-01-19 NOTE — Discharge Instructions (Signed)
Moist heat with antibiotic starting in am , return on fri for recheck.

## 2016-01-19 NOTE — ED Notes (Signed)
Skin marker used to outline redness on patient right lower abdomen. Instructions given to patient regarding increased signs of infection. Patient will return on Friday for f/u.

## 2016-01-19 NOTE — ED Notes (Signed)
The patient presented to the [facility] with a complaint of possible cellulitis on the right side of her panis. The patient stated that it started to get inflamed and sore last night. The patient stated that she has a hx of chronic cellulitis.

## 2016-01-19 NOTE — ED Provider Notes (Signed)
CSN: [id number]     Arrival date & time 01/19/16  1805 History   First MD Initiated Contact with Patient 01/19/16 1818     Chief Complaint  Patient presents with  . Cellulitis   (Consider location/radiation/quality/duration/timing/severity/associated sxs/prior Treatment) Patient is a 60 y.o. female presenting with rash. The history is provided by the patient.  Rash Location:  Torso Torso rash location:  Abd RLQ Quality: painful, redness and swelling   Pain details:    Quality:  Hot   Severity:  Moderate   Onset quality:  Gradual   Duration:  2 days   Progression:  Worsening Severity:  Moderate Onset quality:  Gradual Chronicity:  Recurrent Context comment:  Similar cellulitis approx 1 yr ago,rx with keflex., began with drainage 3d ago stopped on mon , began with pain last eve and erythema today.   Past Medical History  Diagnosis Date  . Obesity   . Borderline hypertension   . Hyperlipidemia   . Bilateral shoulder pain   . Diabetes mellitus     Diagnosed in 2000  . Allergy   . Sleep apnea    Past Surgical History  Procedure Laterality Date  . Cesarean section      x 3  . Breath tek h pylori  09/18/2011    Procedure: BREATH TEK H PYLORI;  Surgeon: Shann Medal, MD;  Location: [facility];  Service: General;  Laterality: N/A;   Family History  Problem Relation Age of Onset  . Alcohol abuse Mother   . Arthritis Mother   . Diabetes Mother   . Sleep apnea Mother   . Alcohol abuse Father   . Heart disease Father   . Sleep apnea Sister    Social History  Substance Use Topics  . Smoking status: Former Smoker -- 1.00 packs/day for 15 years    Types: Cigarettes    Quit date: 01/28/1990  . Smokeless tobacco: Never Used  . Alcohol Use: 1.2 oz/week    1 Glasses of wine, 1 Cans of beer per week     Comment: rare   OB History    No data available     Review of Systems  Skin: Positive for color change and rash.  All other systems reviewed and are  negative.   Allergies  Exenatide; Lisinopril; Other; and Statins  Home Medications   Prior to Admission medications   Medication Sig Start Date End Date Taking? Authorizing Provider  metFORMIN (GLUCOPHAGE) 1000 MG tablet Take 1 tablet (1,000 mg total) by mouth 2 (two) times daily with a meal. 04/15/15  Yes Orlena Sheldon, PA-C  naproxen sodium (ANAPROX) 220 MG tablet Take 220 mg by mouth 2 (two) times daily as needed.   Yes Historical Provider, MD  albuterol (PROAIR HFA) 108 (90 BASE) MCG/ACT inhaler Inhale 2 puffs into the lungs every 6 (six) hours as needed. wheezing 06/17/14   Orlena Sheldon, PA-C  bismuth subsalicylate (PEPTO BISMOL) 262 MG chewable tablet Chew 524 mg by mouth as needed. Reported on 09/21/2015    Historical Provider, MD  cephALEXin (KEFLEX) 500 MG capsule Take 1 capsule (500 mg total) by mouth 4 (four) times daily. Take all of medicine and drink lots of fluids 01/19/16   Billy Fischer, MD  cetirizine (ZYRTEC) 10 MG tablet Take 10 mg by mouth daily.    Historical Provider, MD  losartan (COZAAR) 50 MG tablet Take 1 tablet (50 mg total) by mouth daily. Patient not taking: Reported on 04/29/2015 09/03/14  Orlena Sheldon, PA-C  magnesium oxide (MAG-OX) 400 MG tablet Take 400 mg by mouth 2 (two) times daily.    Historical Provider, MD  Multiple Vitamin (MULITIVITAMIN WITH MINERALS) TABS Take 1 tablet by mouth daily with breakfast.    Historical Provider, MD  UNABLE TO FIND Compression Stocking QD - 15-20 mmh    Historical Provider, MD   Meds Ordered and Administered this Visit   Medications  cefTRIAXone (ROCEPHIN) injection 1 g (1 g Intramuscular Given 01/19/16 1851)    BP 148/83 mmHg  Pulse 97  Temp(Src) 97.9 F (36.6 C) (Oral)  Resp 20  SpO2 97% No data found.   Physical Exam  Constitutional: She is oriented to person, place, and time. She appears well-developed and well-nourished.  Neurological: She is alert and oriented to person, place, and time.  Skin: Skin is warm and  dry. Rash noted. There is erythema.  Erythema and tender warmth to right lower abd skin, no drainage.  Nursing note and vitals reviewed.   ED Course  Procedures (including critical care time)  Labs Review Labs Reviewed - No data to display  Imaging Review No results found.   Visual Acuity Review  Right Eye Distance:   Left Eye Distance:   Bilateral Distance:    Right Eye Near:   Left Eye Near:    Bilateral Near:         MDM   1. Cellulitis of right abdominal wall    Erythema demarcated for future eval.    Billy Fischer, MD 01/19/16 1901

## 2016-03-06 ENCOUNTER — Ambulatory Visit: Payer: Self-pay | Admitting: Pharmacist

## 2016-03-14 ENCOUNTER — Ambulatory Visit (INDEPENDENT_AMBULATORY_CARE_PROVIDER_SITE_OTHER): Payer: 59 | Admitting: Family Medicine

## 2016-03-14 VITALS — BP 140/92 | Temp 98.1°F | Wt 320.0 lb

## 2016-03-14 DIAGNOSIS — E119 Type 2 diabetes mellitus without complications: Secondary | ICD-10-CM | POA: Diagnosis not present

## 2016-03-14 LAB — LIPID PANEL
CHOL/HDL RATIO: 4.1 ratio (ref <=5.0)
CHOLESTEROL: 249 mg/dL — AB (ref 125–200)
HDL: 61 mg/dL (ref >=46)
LDL Cholesterol: 160 mg/dL — ABNORMAL HIGH (ref <130)
Triglycerides: 142 mg/dL (ref <150)
VLDL: 28 mg/dL (ref <30)

## 2016-03-14 LAB — COMPLETE METABOLIC PANEL WITH GFR
ALT: 25 U/L (ref 6–29)
AST: 16 U/L (ref 10–35)
Albumin: 4 g/dL (ref 3.6–5.1)
Alkaline Phosphatase: 94 U/L (ref 33–130)
BUN: 6 mg/dL — ABNORMAL LOW (ref 7–25)
CHLORIDE: 100 mmol/L (ref 98–110)
CO2: 26 mmol/L (ref 20–31)
Calcium: 9 mg/dL (ref 8.6–10.4)
Creat: 0.7 mg/dL (ref 0.50–1.05)
Glucose, Bld: 247 mg/dL — ABNORMAL HIGH (ref 70–99)
Potassium: 4.5 mmol/L (ref 3.5–5.3)
Sodium: 136 mmol/L (ref 135–146)
Total Bilirubin: 0.4 mg/dL (ref 0.2–1.2)
Total Protein: 7.4 g/dL (ref 6.1–8.1)

## 2016-03-14 MED ORDER — PIOGLITAZONE HCL 30 MG PO TABS: 30.0000 mg | ORAL_TABLET | Freq: Every day | ORAL | Status: DC

## 2016-03-14 MED ORDER — METFORMIN HCL 1000 MG PO TABS: 1000.0000 mg | ORAL_TABLET | Freq: Two times a day (BID) | ORAL | Status: DC

## 2016-03-14 MED ORDER — LISINOPRIL 20 MG PO TABS: 20.0000 mg | ORAL_TABLET | Freq: Every day | ORAL | Status: DC

## 2016-03-14 MED ORDER — GLIPIZIDE ER 10 MG PO TB24: 10.0000 mg | ORAL_TABLET | Freq: Every day | ORAL | Status: DC

## 2016-03-14 NOTE — Progress Notes (Signed)
Subjective:    Patient ID: Terri Heath, female    DOB: 07/20/1956, 60 y.o.   MRN: [id number]  Diabetes  6/16 Patient has a large erythematous patch of skin on her lower right pannus/abdomen. It is approximately 10 inches in diameter. It is hot red and tender to the touch. Patient has a history of cellulitis in this area previously. She also reports feeling sick achy and tired. She is tachycardic today. On clinical exam she is in normal sinus rhythm. She denies any palpitations, chest pain, shortness of breath although she does report frequent fevers over the last 24 hours which may explain the tachycardia. She also feels poorly. Her blood pressure today is stable and there is no evidence of sepsis at the present time. At that time, my plan was: Patient has cellulitis on her lower abdomen. I will start the patient on Keflex 500 mg by mouth 3 times a day for 10 days. I will also add doxycycline 100 mg by mouth twice a day for 10 days to cover for possible MRSA as well. Recheck next week or immediately go to the hospital if worsening.  04/20/15 Patient is here today for follow-up. Patient stopped all of her insulin 8 months ago. She attributed significant weight gain to her insulin use. Since discontinuing the insulin the patient has lost approximately 20 pounds. She denies polyuria, polydipsia, or blurred vision. She has been eating a modified Atkins ketogenic diet. She is very resistant to trying insulin or any medication. She also discontinued losartan due to cramps. She is here today for follow-up.  At that time, my plan was: fingerstick hemoglobin A1c today was 9.5. I had a long discussion with the patient regarding her options. I recommended resumption of insulin that the patient is resistant to wanting to do this. Therefore we will start the patient on Victoza and gradually titrate the patient to 1.8 per day. I warned the patient about possible nausea. I would like to recheck a hemoglobin A1c  in 3 months.  At that point if the patient is not responding, I would consider resuming insulin. She has failed invokana in the past due to palpitations.  She doesn't want any meds that cause weight gain.  Also recommended she take metformin twice a day. At the present time she is only been taking it once a day.  03/14/16 I have not seen the patient since that time. She stopped Victoza that I recommended at our last office visit due to nausea and vomiting.  Hemoglobin A1c in March was elevated at 10.0.  Plan was discussed this with her at her follow-up office visit however she has not followed up until today.  She continues to only take metformin 1000 mg twice daily which is obviously not effective or sufficient.  Patient does not want to take any of your medications for diabetes due to side effects she is experienced in the past. I asked what the road block was to help control her blood sugars to see if there was anything I could do to remove the road block. Apparently I am the road block. She states that she does not feel like I am listening to her. She is interested in trying a ketogenic diet to help manage her sugars. It is my medical opinion that this is not sufficient.  I did explain that I had not seen the patient and 11 months and so I do not honestly feel like that's the problem. It's hard to listen to  the patient if he never see them. I believe there is a tremendous amount of denial. I believe the patient is ignoring her condition. However my ability to help her is limited by noncompliance. She discontinued losartan due to muscle cramps. She has a listed allergy to lisinopril. Apparently a Coles coughs in the past although she would be willing to try again to help manage her blood pressure and prevent nephropathy secondary to diabetes Past Medical History  Diagnosis Date  . Obesity   . Borderline hypertension   . Hyperlipidemia   . Bilateral shoulder pain   . Diabetes mellitus     Diagnosed in  2000  . Allergy   . Sleep apnea    Past Surgical History  Procedure Laterality Date  . Cesarean section      x 3  . Breath tek h pylori  09/18/2011    Procedure: BREATH TEK H PYLORI;  Surgeon: Shann Medal, MD;  Location: [facility];  Service: General;  Laterality: N/A;   Current Outpatient Prescriptions on File Prior to Visit  Medication Sig Dispense Refill  . albuterol (PROAIR HFA) 108 (90 BASE) MCG/ACT inhaler Inhale 2 puffs into the lungs every 6 (six) hours as needed. wheezing 18 g 2  . bismuth subsalicylate (PEPTO BISMOL) 262 MG chewable tablet Chew 524 mg by mouth as needed. Reported on 09/21/2015    . cephALEXin (KEFLEX) 500 MG capsule Take 1 capsule (500 mg total) by mouth 4 (four) times daily. Take all of medicine and drink lots of fluids 28 capsule 0  . cetirizine (ZYRTEC) 10 MG tablet Take 10 mg by mouth daily.    Marland Kitchen losartan (COZAAR) 50 MG tablet Take 1 tablet (50 mg total) by mouth daily. (Patient not taking: Reported on 04/29/2015) 30 tablet 3  . magnesium oxide (MAG-OX) 400 MG tablet Take 400 mg by mouth 2 (two) times daily.    . metFORMIN (GLUCOPHAGE) 1000 MG tablet Take 1 tablet (1,000 mg total) by mouth 2 (two) times daily with a meal. 180 tablet 2  . Multiple Vitamin (MULITIVITAMIN WITH MINERALS) TABS Take 1 tablet by mouth daily with breakfast.    . naproxen sodium (ANAPROX) 220 MG tablet Take 220 mg by mouth 2 (two) times daily as needed.    Marland Kitchen UNABLE TO FIND Compression Stocking QD - 15-20 mmh     No current facility-administered medications on file prior to visit.   Allergies  Allergen Reactions  . Exenatide     Flu-like symptoms  . Lisinopril Other (See Comments)    cramping  . Other Diarrhea    Kale=food  . Statins Other (See Comments)    Leg cramps   Social History   Social History  . Marital Status: Married    Spouse Name: N/A  . Number of Children: N/A  . Years of Education: N/A   Occupational History  . Not on file.   Social History Main  Topics  . Smoking status: Former Smoker -- 1.00 packs/day for 15 years    Types: Cigarettes    Quit date: 01/28/1990  . Smokeless tobacco: Never Used  . Alcohol Use: 1.2 oz/week    1 Glasses of wine, 1 Cans of beer per week     Comment: rare  . Drug Use: No  . Sexual Activity: Not Currently   Other Topics Concern  . Not on file   Social History Narrative      Review of Systems  All other systems reviewed and are  negative.      Objective:   Physical Exam  Constitutional: She appears well-developed and well-nourished. No distress.  Cardiovascular: Normal rate, regular rhythm and normal heart sounds.   Pulmonary/Chest: Effort normal and breath sounds normal.  Skin: She is not diaphoretic.  Vitals reviewed.  Wt Readings from Last 3 Encounters:  03/14/16 320 lb (145.151 kg)  01/03/16 323 lb (146.512 kg)  09/21/15 318 lb (144.244 kg)          Assessment & Plan:  Type 2 diabetes mellitus without complication, without long-term current use of insulin (HCC) - Plan: COMPLETE METABOLIC PANEL WITH GFR, Hemoglobin A1c, metFORMIN (GLUCOPHAGE) 1000 MG tablet, Lipid panel, Microalbumin, urine  Unfortunately I feel there is little I can do to help. I will resume lisinopril 20 mg by mouth daily for hypertension. I will check a CBC, CMP, fasting lipid panel, and hemoglobin A1c. Add glipizide extended release 10 mg a day and Actos 30 mg a day to her metformin. I would like to recheck lab work in 3 months. Continued noncompliance will cause me to discontinue our patient/physician relationship. Hopefully she will be able to tolerate these medications. I sincerely want to help this patient however I do not feel that she trusts my opinion. Furthermore, it seems there is conflict every time we discussed her diabetes and this significantly hampers my ability to manage her conditions.

## 2016-03-15 ENCOUNTER — Telehealth: Payer: Self-pay | Admitting: Family Medicine

## 2016-03-15 DIAGNOSIS — E119 Type 2 diabetes mellitus without complications: Secondary | ICD-10-CM

## 2016-03-15 LAB — HEMOGLOBIN A1C
Hgb A1c MFr Bld: 9.9 % — ABNORMAL HIGH (ref <5.7)
MEAN PLASMA GLUCOSE: 237 mg/dL

## 2016-03-15 LAB — MICROALBUMIN, URINE: Microalb, Ur: 2.2 mg/dL

## 2016-03-15 MED ORDER — GLIPIZIDE ER 10 MG PO TB24: 10.0000 mg | ORAL_TABLET | Freq: Every day | ORAL | Status: DC

## 2016-03-15 MED ORDER — PIOGLITAZONE HCL 30 MG PO TABS: 30.0000 mg | ORAL_TABLET | Freq: Every day | ORAL | Status: DC

## 2016-03-15 MED ORDER — METFORMIN HCL 1000 MG PO TABS: 1000.0000 mg | ORAL_TABLET | Freq: Two times a day (BID) | ORAL | Status: DC

## 2016-03-15 MED ORDER — LISINOPRIL 20 MG PO TABS: 20.0000 mg | ORAL_TABLET | Freq: Every day | ORAL | Status: DC

## 2016-03-15 MED FILL — glipiZIDE XL 10 MG TB24: 10 | 30 days supply | Qty: 30 | Fill #0

## 2016-03-15 MED FILL — LISINOPRIL 20 MG TABLET: 20 | 90 days supply | Qty: 90 | Fill #0

## 2016-03-15 MED FILL — PIOGLITAZONE HCL 30 MG TAB: 30 | 30 days supply | Qty: 30 | Fill #0

## 2016-03-15 MED FILL — metFORMIN HCL 1000 MG TABS: 1000 | 90 days supply | Qty: 180 | Fill #0

## 2016-03-15 NOTE — Telephone Encounter (Signed)
Medication called/sent to requested pharmacy  

## 2016-03-15 NOTE — Telephone Encounter (Signed)
Patients prescriptions from yesterday need to be sent to [facility] instead of [facility]  [phone]

## 2016-03-16 ENCOUNTER — Encounter: Payer: Self-pay | Admitting: Family Medicine

## 2016-03-17 MED ORDER — PRAVASTATIN SODIUM 20 MG PO TABS: 20.0000 mg | ORAL_TABLET | Freq: Every day | ORAL | Status: DC

## 2016-03-17 MED FILL — PRAVASTATIN SODIUM 20 MG TA: 20 | 90 days supply | Qty: 90 | Fill #0

## 2016-03-17 NOTE — Telephone Encounter (Signed)
Medication called/sent to requested pharmacy and pt aware via mychart 

## 2016-03-19 ENCOUNTER — Encounter (HOSPITAL_COMMUNITY): Payer: Self-pay | Admitting: *Deleted

## 2016-03-19 ENCOUNTER — Emergency Department (HOSPITAL_COMMUNITY)
Admission: EM | Admit: 2016-03-19 | Discharge: 2016-03-20 | Disposition: A | Discharge status: Home / Self Care | Payer: 59 | Attending: Emergency Medicine | Admitting: Emergency Medicine

## 2016-03-19 DIAGNOSIS — E119 Type 2 diabetes mellitus without complications: Secondary | ICD-10-CM | POA: Insufficient documentation

## 2016-03-19 DIAGNOSIS — Z7984 Long term (current) use of oral hypoglycemic drugs: Secondary | ICD-10-CM | POA: Insufficient documentation

## 2016-03-19 DIAGNOSIS — N390 Urinary tract infection, site not specified: Secondary | ICD-10-CM | POA: Insufficient documentation

## 2016-03-19 DIAGNOSIS — Z87891 Personal history of nicotine dependence: Secondary | ICD-10-CM | POA: Insufficient documentation

## 2016-03-19 DIAGNOSIS — E785 Hyperlipidemia, unspecified: Secondary | ICD-10-CM | POA: Diagnosis not present

## 2016-03-19 DIAGNOSIS — R300 Dysuria: Secondary | ICD-10-CM | POA: Diagnosis present

## 2016-03-19 NOTE — ED Triage Notes (Signed)
Pt c/o blood tinged urine with dysuria and urinary frequency with decreased urine output.

## 2016-03-19 NOTE — ED Provider Notes (Signed)
[facility] DEPT Provider Note   CSN: [id number] Arrival date & time: 03/19/16  2219  First Provider Contact:  First MD Initiated Contact with Patient 03/19/16 2356        History   Chief Complaint Chief Complaint  Patient presents with  . Dysuria    HPI Terri Heath is a 60 y.o. female.  The history is provided by the patient.  Dysuria   This is a new problem. The current episode started 3 to 5 hours ago. The problem occurs every urination. The problem has been gradually improving. The quality of the pain is described as burning. The pain is mild. There has been no fever. Associated symptoms include frequency, hematuria and urgency. Pertinent negatives include no chills, no sweats, no nausea, no vomiting, no discharge and no flank pain. She has tried nothing for the symptoms. Her past medical history is significant for recurrent UTIs.    Past Medical History:  Diagnosis Date  . Allergy   . Bilateral shoulder pain   . Borderline hypertension   . Diabetes mellitus    Diagnosed in 2000  . Hyperlipidemia   . Obesity   . Sleep apnea     Patient Active Problem List   Diagnosis Date Noted  . Allergic rhinitis 06/11/2014  . Sleep apnea   . Asthma due to seasonal allergies 10/24/2012  . OSA (obstructive sleep apnea) 05/23/2011  . Diabetes mellitus type 2, insulin dependent ([place]) 03/26/2007  . Hyperlipemia 03/26/2007  . Morbid obesity, weight 368, BMI 57.7. 03/26/2007  . Essential hypertension 03/26/2007    Past Surgical History:  Procedure Laterality Date  . BREATH TEK H PYLORI  09/18/2011   Procedure: BREATH TEK H PYLORI;  Surgeon: Shann Medal, MD;  Location: [facility];  Service: General;  Laterality: N/A;  . CESAREAN SECTION     x 3    OB History    No data available       Home Medications    Prior to Admission medications   Medication Sig Start Date End Date Taking? Authorizing Provider  albuterol (PROAIR HFA) 108 (90 BASE) MCG/ACT inhaler  Inhale 2 puffs into the lungs every 6 (six) hours as needed. wheezing 06/17/14   Orlena Sheldon, PA-C  bismuth subsalicylate (PEPTO BISMOL) 262 MG chewable tablet Chew 524 mg by mouth as needed. Reported on 09/21/2015    Historical Provider, MD  cephALEXin (KEFLEX) 500 MG capsule Take 1 capsule (500 mg total) by mouth 4 (four) times daily. 03/20/16   Evalee Jefferson, PA-C  cetirizine (ZYRTEC) 10 MG tablet Take 10 mg by mouth daily.    Historical Provider, MD  glipiZIDE (GLUCOTROL XL) 10 MG 24 hr tablet Take 1 tablet (10 mg total) by mouth daily with breakfast. 03/15/16   Susy Frizzle, MD  lisinopril (PRINIVIL,ZESTRIL) 20 MG tablet Take 1 tablet (20 mg total) by mouth daily. 03/15/16   Susy Frizzle, MD  losartan (COZAAR) 50 MG tablet Take 1 tablet (50 mg total) by mouth daily. Patient not taking: Reported on 03/14/2016 09/03/14   Orlena Sheldon, PA-C  magnesium oxide (MAG-OX) 400 MG tablet Take 400 mg by mouth 2 (two) times daily.    Historical Provider, MD  metFORMIN (GLUCOPHAGE) 1000 MG tablet Take 1 tablet (1,000 mg total) by mouth 2 (two) times daily with a meal. 03/15/16   Susy Frizzle, MD  Multiple Vitamin (MULITIVITAMIN WITH MINERALS) TABS Take 1 tablet by mouth daily with breakfast.    Historical Provider,  MD  naproxen sodium (ANAPROX) 220 MG tablet Take 220 mg by mouth 2 (two) times daily as needed.    Historical Provider, MD  phenazopyridine (PYRIDIUM) 200 MG tablet Take 1 tablet (200 mg total) by mouth 3 (three) times daily. 03/20/16   Evalee Jefferson, PA-C  pioglitazone (ACTOS) 30 MG tablet Take 1 tablet (30 mg total) by mouth daily. 03/15/16   Susy Frizzle, MD  pravastatin (PRAVACHOL) 20 MG tablet Take 1 tablet (20 mg total) by mouth daily. 03/17/16   Susy Frizzle, MD  UNABLE TO FIND Compression Stocking QD - 15-20 mmh    Historical Provider, MD    Family History Family History  Problem Relation Age of Onset  . Alcohol abuse Mother   . Arthritis Mother   . Diabetes Mother   . Sleep  apnea Mother   . Alcohol abuse Father   . Heart disease Father   . Sleep apnea Sister     Social History Social History  Substance Use Topics  . Smoking status: Former Smoker    Packs/day: 1.00    Years: 15.00    Types: Cigarettes    Quit date: 01/28/1990  . Smokeless tobacco: Never Used  . Alcohol use 1.2 oz/week    1 Glasses of wine, 1 Cans of beer per week     Comment: rare     Allergies   Exenatide; Lisinopril; Other; and Statins   Review of Systems Review of Systems  Constitutional: Negative for chills and fever.  HENT: Negative for congestion and sore throat.   Eyes: Negative.   Respiratory: Negative for chest tightness and shortness of breath.   Cardiovascular: Negative for chest pain.  Gastrointestinal: Negative for abdominal pain, nausea and vomiting.  Genitourinary: Positive for dysuria, frequency, hematuria and urgency. Negative for flank pain.  Musculoskeletal: Negative for arthralgias, joint swelling and neck pain.  Skin: Negative.  Negative for rash and wound.  Neurological: Negative for dizziness, weakness, light-headedness, numbness and headaches.  Psychiatric/Behavioral: Negative.      Physical Exam Updated Vital Signs BP 140/84 (BP Location: Left Arm)   Pulse 105   Temp 97.3 F (36.3 C) (Oral)   Resp 18   Ht 5\' 7"  (1.702 m)   Wt (!) 142.9 kg   SpO2 97%   BMI 49.34 kg/m   Physical Exam  Constitutional: She appears well-developed.  Morbidly obese  HENT:  Head: Normocephalic and atraumatic.  Eyes: Conjunctivae are normal.  Neck: Normal range of motion.  Cardiovascular: Normal rate.   Pulmonary/Chest: Effort normal. She has no wheezes.  Abdominal: Bowel sounds are normal. There is no tenderness.  Musculoskeletal: Normal range of motion.  Neurological: She is alert.  Skin: Skin is warm and dry.  Psychiatric: She has a normal mood and affect.  Nursing note and vitals reviewed.    ED Treatments / Results  Labs  Results for orders  placed or performed during the hospital encounter of 03/19/16  Urinalysis, Routine w reflex microscopic- may I&O cath if menses  Result Value Ref Range   Color, Urine YELLOW YELLOW   APPearance CLEAR CLEAR   Specific Gravity, Urine <1.005 (L) 1.005 - 1.030   pH 5.5 5.0 - 8.0   Glucose, UA NEGATIVE NEGATIVE mg/dL   Hgb urine dipstick LARGE (A) NEGATIVE   Bilirubin Urine NEGATIVE NEGATIVE   Ketones, ur NEGATIVE NEGATIVE mg/dL   Protein, ur NEGATIVE NEGATIVE mg/dL   Nitrite NEGATIVE NEGATIVE   Leukocytes, UA MODERATE (A) NEGATIVE  Urine microscopic-add  on  Result Value Ref Range   Squamous Epithelial / LPF 0-5 (A) NONE SEEN   WBC, UA 6-30 0 - 5 WBC/hpf   RBC / HPF 6-30 0 - 5 RBC/hpf   Bacteria, UA FEW (A) NONE SEEN           Procedures Procedures (including critical care time)  Medications Ordered in ED Medications  cephALEXin (KEFLEX) capsule 500 mg (not administered)  phenazopyridine (PYRIDIUM) tablet 200 mg (not administered)     Initial Impression / Assessment and Plan / ED Course  I have reviewed the triage vital signs and the nursing notes.  Pertinent labs & imaging results that were available during my care of the patient were reviewed by me and considered in my medical decision making (see chart for details).  Clinical Course    Pt given first dose of keflex and pyridium prior to dc home.  She was advised increased fluid intake, close f/u for any fever, vomiting, worse pain.   The patient appears reasonably screened and/or stabilized for discharge and I doubt any other medical condition or other [facility] requiring further screening, evaluation, or treatment in the ED at this time prior to discharge.   Final Clinical Impressions(s) / ED Diagnoses   Final diagnoses:  UTI (lower urinary tract infection)    New Prescriptions New Prescriptions   CEPHALEXIN (KEFLEX) 500 MG CAPSULE    Take 1 capsule (500 mg total) by mouth 4 (four) times daily.   PHENAZOPYRIDINE  (PYRIDIUM) 200 MG TABLET    Take 1 tablet (200 mg total) by mouth 3 (three) times daily.     Evalee Jefferson, PA-C 03/20/16 [place], DO 03/20/16 0104

## 2016-03-20 DIAGNOSIS — Z87891 Personal history of nicotine dependence: Secondary | ICD-10-CM | POA: Diagnosis not present

## 2016-03-20 DIAGNOSIS — E785 Hyperlipidemia, unspecified: Secondary | ICD-10-CM | POA: Diagnosis not present

## 2016-03-20 DIAGNOSIS — Z7984 Long term (current) use of oral hypoglycemic drugs: Secondary | ICD-10-CM | POA: Diagnosis not present

## 2016-03-20 DIAGNOSIS — N390 Urinary tract infection, site not specified: Secondary | ICD-10-CM | POA: Diagnosis not present

## 2016-03-20 DIAGNOSIS — E119 Type 2 diabetes mellitus without complications: Secondary | ICD-10-CM | POA: Diagnosis not present

## 2016-03-20 LAB — URINALYSIS, ROUTINE W REFLEX MICROSCOPIC
BILIRUBIN URINE: NEGATIVE
Glucose, UA: NEGATIVE mg/dL
KETONES UR: NEGATIVE mg/dL
NITRITE: NEGATIVE
PROTEIN: NEGATIVE mg/dL
Specific Gravity, Urine: <1.005 — ABNORMAL LOW (ref 1.005–1.030)
pH: 5.5 (ref 5.0–8.0)

## 2016-03-20 LAB — URINE MICROSCOPIC-ADD ON

## 2016-03-20 MED ORDER — CEPHALEXIN 500 MG PO CAPS
500.0000 mg | ORAL_CAPSULE | Freq: Once | ORAL | Status: AC
  Administered 2016-03-20: 500 mg via ORAL
  Filled 2016-03-20: qty 1

## 2016-03-20 MED ORDER — CEPHALEXIN 500 MG PO CAPS: 500.0000 mg | ORAL_CAPSULE | Freq: Four times a day (QID) | ORAL | 0 refills | Status: DC

## 2016-03-20 MED ORDER — PHENAZOPYRIDINE HCL 200 MG PO TABS: 200.0000 mg | ORAL_TABLET | Freq: Three times a day (TID) | ORAL | 0 refills | Status: DC

## 2016-03-20 MED ORDER — PHENAZOPYRIDINE HCL 100 MG PO TABS
200.0000 mg | ORAL_TABLET | Freq: Once | ORAL | Status: AC
  Administered 2016-03-20: 200 mg via ORAL
  Filled 2016-03-20: qty 2

## 2016-03-20 MED FILL — PHENAZOPYRIDINE 200 MG TAB: 200 | 3 days supply | Qty: 6 | Fill #0

## 2016-03-20 MED FILL — CEPHALEXIN 500 MG CAPSULE: 500 | 7 days supply | Qty: 28 | Fill #0

## 2016-03-22 LAB — URINE CULTURE

## 2016-03-23 ENCOUNTER — Telehealth: Payer: Self-pay | Admitting: *Deleted

## 2016-03-23 NOTE — Telephone Encounter (Signed)
Post ED Visit - Positive Culture Follow-up  Culture report reviewed by antimicrobial stewardship pharmacist:  [x]  Elenor Quinones, Pharm.D. []  Heide Guile, Pharm.D., BCPS []  Parks Neptune, Pharm.D. []  Alycia Rossetti, Pharm.D., BCPS []  [place], Pharm.D., BCPS, AAHIVP []  Legrand Como, Pharm.D., BCPS, AAHIVP []  Milus Glazier, Pharm.D. []  Stephens November, Pharm.D.  Positive urine culture Treated with Cephalexin, organism sensitive to the same and no further patient follow-up is required at this time.  Harlon Flor [facility] 03/23/2016, 10:16 AM

## 2016-03-30 ENCOUNTER — Ambulatory Visit: Payer: 59 | Admitting: Pharmacist

## 2016-04-10 ENCOUNTER — Ambulatory Visit: Payer: 59 | Admitting: Family

## 2016-04-13 MED FILL — TRUE METRIX GLUCOSE TEST ST: 90 days supply | Qty: 100 | Fill #1

## 2016-04-20 ENCOUNTER — Other Ambulatory Visit: Payer: Self-pay | Admitting: Pharmacist

## 2016-04-20 ENCOUNTER — Encounter: Payer: Self-pay | Admitting: Pharmacist

## 2016-04-20 VITALS — Wt 326.0 lb

## 2016-04-20 DIAGNOSIS — E118 Type 2 diabetes mellitus with unspecified complications: Secondary | ICD-10-CM

## 2016-04-20 NOTE — Patient Outreach (Signed)
Terri Heath) Care Management  04/20/2016  Terri Heath 10-Nov-1955 [id number]   Subjective:  Patient is a 60 yo female with type 2 diabetes who presents today for follow-up in the [facility] program. Currently prescribed diabetes regimen includes Metformin, Glipizide, and pioglitazone; however, pt is taking only Metformin (see details below).  Patient is not currently taking ASA, ACEi, and statin per patient preference, but has been prescribed both ACEi and statin.  Most recent MD follow-up was with Dr. Jenna Luo in July 2017.  Patient has a pending appt for follow-up in October 2017. Of note, patient desires to improve her health and wants to be compliant but struggles due to fear and misconceptions.  She is resistant to starting new medications out of fear of adverse events.  In addition she sometimes feels that taking a medication represents a failure and tries to manage with diet alone (which in her case, even a perfect diet is not going to be enough).  She withdraws when scolded in any way and needs constant reassurance that medical professionals are acting in her best interest.  She is genuinely concerned with her health and does make an effort to work through the above barriers.     Diabetes Assessment:  Patient continues on Metformin alone.  However, she was recently prescribed pioglitazone and glipizide.  She took glipizide for 1 week, during which time she experienced diarrhea, which she believed was due to glipizide, she discontinued this med and diarrhea has resolved.  I have asked her to try a second trial of glipizide and she has agreed.  Pt took pioglitazone daily for approximately 1 month, during which time she noticed lower extremity edema and a 10 lb wt gain, which she believes is mostly fluid.  She discontinued pioglitazone yesterday and ankles do appear swollen, no weight loss has been noted.  I have recommended she stay off this medication until  speaking further with Dr. Dennard Schaumann.  In addition, pt was also prescribed lisinopril and pravastatin.  She has not started either medication as she is fearful of adverse effects and feels the doses are too high.  I have educated her that the doses are very appropriate and have advised that she start both medications.  In order to ease her anxiety about dosage, I have recommended she start both meds at 1/2 tablet to assess tolerability, and then increase to prescribed dose after one week.  Most recent A1c was 9.9% (prev 10.3) in July at MD office which is exceeding goal of less than 7%.  Weight has increased since last visit, but pt believes this is due to edema.  Patient continues using True[employer] meter.  She is testing regularly at this time, 1-3 times daily, since starting new medications.  She did not bring meter today, but per patient report, fasting improved while on pioglitazone and glipizide but is now elevated >200 since discontinuing these medications.  Hypoglycemia is rare.  Patient reports signs and symptoms of neuropathy including some intermittent numbness on left foot, 3-4th toes, although this has not worsened and remains much the same.  Patient does have one small sore (size of pencil lead) on left foot, 4th toe, that she reports started as a mosquito bite and has been irritated by her shoes.  I have asked her to monitor this closely for worsening.  Patient has a history of recurrent abdominal cellultis but this is now resolved and no current infection.  Patient is up to date on dental  exam but is due for eye exam anytime (last exam Feb 2016).       Lifestyle Assessment:  Diet - Pt continues on low carb diet.  Very little bread, potatoe, rice, or pasta, allows herself one serving of starch per day (ex: 1 slice of bread or 1 serving of rice).  Continues using plate method to regulate portion control.  Food recall: Breakfast - sausage and eggs (fried or scrambled), sometimes cottage cheese  with fruit Snack - Cheese with olives Lunch - leftovers from supper (ex:  Carrots and pork ribs) Snack - 1/2 avacado or pork rinds (ziplock snack size bag) Supper - chicken, hamburger patty, or pork (always baked, grilled, or cooked in pan with Pam spray) with one vegetable (brocolli, brussel sprouts, asparagus, salad, carrots, etc), and sometimes one starch Beverages - tea unsweet, coffee, water, diet soda on occassion  Exercise - Pt is walking laps in her pool twice weekly for approximately one hour each time.  She is no longer taking walk breaks at her desk as she became bored with this, but wants to resume.     Plan and Goals:  1) Dietary goals  Continue to maintain dietary changes.  Focus on limiting "bad fats" (ex: pork rinds, cheese, fried egg)    Consider replacing scrambled/fried eggs with boiled eggs (discard yolk) in order to reduce cholesterol consumption   Keep a food journal of EVERY food consumed for 1-2 weeks prior to next appointment  2)  Exercise goal: Resume walk videos at work in order to increase overall exercise 3)  Medications Goals:  Resume glipizide to determine if it was truly the cause of diarrhea  Start Lisinopril and Pravastatin at 1/2 tablet daily for 1 week (to ensure tolerance) then increase to prescribed dose of 1  tablet daily 4)  Keep scheduled appointment with Dr. Dennard Schaumann! 5)  Schedule routine eye exam 6)  Follow-up with [facility] one month after seeing Dr. Dennard Schaumann  Appointment scheduled for Thursday November 16th @ 3:00 pm   Great to see you today!  Tilman Neat, PharmD [facility]  [phone]

## 2016-04-25 ENCOUNTER — Encounter: Payer: Self-pay | Admitting: Pharmacist

## 2016-04-27 ENCOUNTER — Encounter: Payer: Self-pay | Admitting: Adult Health

## 2016-04-27 ENCOUNTER — Ambulatory Visit (INDEPENDENT_AMBULATORY_CARE_PROVIDER_SITE_OTHER): Payer: 59 | Admitting: Adult Health

## 2016-04-27 DIAGNOSIS — J45909 Unspecified asthma, uncomplicated: Secondary | ICD-10-CM | POA: Diagnosis not present

## 2016-04-27 DIAGNOSIS — Z794 Long term (current) use of insulin: Secondary | ICD-10-CM

## 2016-04-27 DIAGNOSIS — G4733 Obstructive sleep apnea (adult) (pediatric): Secondary | ICD-10-CM | POA: Diagnosis not present

## 2016-04-27 DIAGNOSIS — E119 Type 2 diabetes mellitus without complications: Secondary | ICD-10-CM

## 2016-04-27 MED ORDER — LEVALBUTEROL HCL 0.63 MG/3ML IN NEBU
0.6300 mg | INHALATION_SOLUTION | Freq: Once | RESPIRATORY_TRACT | Status: AC
  Administered 2016-04-27: 0.63 mg via RESPIRATORY_TRACT

## 2016-04-27 MED ORDER — AMOXICILLIN-POT CLAVULANATE 875-125 MG PO TABS: 1.0000 | ORAL_TABLET | Freq: Two times a day (BID) | ORAL | 0 refills | Status: DC

## 2016-04-27 MED ORDER — AMOXICILLIN-POT CLAVULANATE 875-125 MG PO TABS: 1.0000 | ORAL_TABLET | Freq: Two times a day (BID) | ORAL | 0 refills | Status: AC

## 2016-04-27 MED FILL — AMOX-CLAV 875-125 MG TABLET: 875-125 | 7 days supply | Qty: 14 | Fill #0

## 2016-04-27 NOTE — Progress Notes (Signed)
Reviewed & agree with plan  

## 2016-04-27 NOTE — Assessment & Plan Note (Signed)
Mild flare with URI/Bronchitis  xopenex neb x 1   Plan  Patient Instructions  Continue on CPAP At bedtime   Keep up good work .  CPAP download requested.  Do not drive if sleepy.  Continue to work on weight loss, keep up good work .  Augmentin 875mg  Twice daily  For 7 days- take with food.  Mucinex DM Twice daily  .As needed  Cough/congestion.  follow up Dr. Elsworth Soho  In 1 year and As needed

## 2016-04-27 NOTE — Addendum Note (Signed)
Addended by: Mathis Dad on: 04/27/2016 11:59 AM   Modules accepted: Orders

## 2016-04-27 NOTE — Addendum Note (Signed)
Addended by: Mathis Dad on: 04/27/2016 11:17 AM   Modules accepted: Orders

## 2016-04-27 NOTE — Progress Notes (Signed)
Subjective:    Patient ID: Terri Heath, female    DOB: 07/07/1956, 60 y.o.   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 She has gained about 30 pounds since the study.  Download 10/8 -07/03/11 >> good complaince ,GOod control of events on 12 cm  SPirometry showed FEv1 64% with ratio of 72, FVC was 71% - mild restriction  04/27/2016 Follow up : OSA and Asthma  Pt returns for 1 year follow up for sleep apnea.  Says she is doing well on CPAP  Wears every night for 8hr .  Feels rested, no sign daytime sleepiness.  No CPAP download is available , this was requested.   Pt has had asthma like symptoms in past . Uses ProAir As needed . Says she has been well controlled .  Says she got a cough last week. Complains of 1 week of cough, congestion , nasal drainage., sob and wheezing. Has yellow to brown mucus. No fever or hemoptysis .  Had to use SABA x 2 .  Cough is really bad, coughed so hard she had stress incontinence .  Works for [employer] .  No recent abx , or travel.   Recently changed her meds , could not tolerate glipizide , actos and cozaar.  Was started on lisinopril but is not taking yet because she is worried about it making her cough worse.     Past Medical History:  Diagnosis Date  . Allergy   . Bilateral shoulder pain   . Borderline hypertension   . Diabetes mellitus    Diagnosed in 2000  . Hyperlipidemia   . Obesity   . Sleep apnea    Current Outpatient Prescriptions on File Prior to Visit  Medication Sig Dispense Refill  . albuterol (PROAIR HFA) 108 (90 BASE) MCG/ACT inhaler Inhale 2 puffs into the lungs every 6 (six) hours as needed. wheezing 18 g 2  . bismuth subsalicylate (PEPTO BISMOL) 262 MG chewable tablet Chew 524 mg by mouth as needed. Reported on 09/21/2015    . cetirizine (ZYRTEC) 10 MG tablet Take 10 mg by mouth daily.    Marland Kitchen  losartan (COZAAR) 50 MG tablet Take 1 tablet (50 mg total) by mouth daily. 30 tablet 3  . magnesium oxide (MAG-OX) 400 MG tablet Take 400 mg by mouth 2 (two) times daily.    . metFORMIN (GLUCOPHAGE) 1000 MG tablet Take 1 tablet (1,000 mg total) by mouth 2 (two) times daily with a meal. 180 tablet 2  . Multiple Vitamin (MULITIVITAMIN WITH MINERALS) TABS Take 1 tablet by mouth daily with breakfast.    . naproxen sodium (ANAPROX) 220 MG tablet Take 220 mg by mouth 2 (two) times daily as needed.    . pioglitazone (ACTOS) 30 MG tablet Take 1 tablet (30 mg total) by mouth daily. 30 tablet 5  . pravastatin (PRAVACHOL) 20 MG tablet Take 1 tablet (20 mg total) by mouth daily. 90 tablet 3  . UNABLE TO FIND Compression Stocking QD - 15-20 mmh     No current facility-administered medications on file prior to visit.     Review of Systems Constitutional:   No  weight loss, night sweats,  Fevers, chills, fatigue, or  lassitude.  HEENT:   No headaches,  Difficulty swallowing,  Tooth/dental problems, or  Sore throat,  No sneezing, itching, ear ache,  +nasal congestion, post nasal drip,   CV:  No chest pain,  Orthopnea, PND, swelling in lower extremities, anasarca, dizziness, palpitations, syncope.   GI  No heartburn, indigestion, abdominal pain, nausea, vomiting, diarrhea, change in bowel habits, loss of appetite, bloody stools.   Resp:    No chest wall deformity  Skin: no rash or lesions.  GU: no dysuria, change in color of urine, no urgency or frequency.  No flank pain, no hematuria   MS:  No joint pain or swelling.  No decreased range of motion.  No back pain.  Psych:  No change in mood or affect. No depression or anxiety.  No memory loss.         Objective:   Physical Exam Vitals:   04/27/16 1034  BP: 140/85  Pulse: 97  Temp: 97.8 F (36.6 C)  TempSrc: Oral  SpO2: 94%  Weight: (!) 319 lb (144.7 kg)  Height: 5\' 7"  (1.702 m)  Body mass index is 49.96 kg/m.   GEN:  A/Ox3; pleasant , NAD, morbidly obese    HEENT:  [place]/AT,  EACs-clear, TMs-wnl, NOSE-clear, THROAT-clear, no lesions, no postnasal drip or exudate noted.   NECK:  Supple w/ fair ROM; no JVD; normal carotid impulses w/o bruits; no thyromegaly or nodules palpated; no lymphadenopathy.    RESP  Few trace wheezes , speaks in full sentences  no accessory muscle use, no dullness to percussion  CARD:  RRR, no m/r/g  , no peripheral edema, pulses intact, no cyanosis or clubbing.  GI:   Soft & nt; nml bowel sounds; no organomegaly or masses detected.   Musco: Warm bil, no deformities or joint swelling noted.   Neuro: alert, no focal deficits noted.    Skin: Warm, no lesions or rashes  Letetia Romanello NP-C  [facility]  04/27/2016

## 2016-04-27 NOTE — Assessment & Plan Note (Signed)
Well controlled Check CPAP download   Plan  Patient Instructions  Continue on CPAP At bedtime   Keep up good work .  CPAP download requested.  Do not drive if sleepy.  Continue to work on weight loss, keep up good work .  Augmentin 875mg  Twice daily  For 7 days- take with food.  Mucinex DM Twice daily  .As needed  Cough/congestion.  follow up Dr. Elsworth Soho  In 1 year and As needed

## 2016-04-27 NOTE — Patient Instructions (Addendum)
Continue on CPAP At bedtime   Keep up good work .  CPAP download requested.  Do not drive if sleepy.  Continue to work on weight loss, keep up good work .  Augmentin 875mg  Twice daily  For 7 days- take with food.  Mucinex DM Twice daily  .As needed  Cough/congestion.  follow up Dr. Elsworth Soho  In 1 year and As needed

## 2016-06-15 ENCOUNTER — Ambulatory Visit: Payer: 59 | Admitting: Family Medicine

## 2016-06-29 MED FILL — metFORMIN HCL 1000 MG TABS: 1000 | 90 days supply | Qty: 180 | Fill #1

## 2016-07-05 ENCOUNTER — Encounter: Payer: Self-pay | Admitting: Family Medicine

## 2016-07-05 ENCOUNTER — Ambulatory Visit (INDEPENDENT_AMBULATORY_CARE_PROVIDER_SITE_OTHER): Payer: 59 | Admitting: Family Medicine

## 2016-07-05 VITALS — BP 138/80 | HR 96 | Temp 98.3°F | Resp 16 | Ht 67.0 in | Wt 319.0 lb

## 2016-07-05 DIAGNOSIS — Z6841 Body Mass Index (BMI) 40.0 and over, adult: Secondary | ICD-10-CM | POA: Diagnosis not present

## 2016-07-05 DIAGNOSIS — E782 Mixed hyperlipidemia: Secondary | ICD-10-CM | POA: Diagnosis not present

## 2016-07-05 DIAGNOSIS — E118 Type 2 diabetes mellitus with unspecified complications: Secondary | ICD-10-CM | POA: Diagnosis not present

## 2016-07-05 NOTE — Patient Instructions (Signed)
     IF you received an x-ray today, you will receive an invoice from [facility]. Please contact [facility] at [phone] with questions or concerns regarding your invoice.   IF you received labwork today, you will receive an invoice from [facility]. Please contact [facility] at [phone] with questions or concerns regarding your invoice.   Our billing staff will not be able to assist you with questions regarding bills from these companies.  You will be contacted with the lab results as soon as they are available. The fastest way to get your results is to activate your My Chart account. Instructions are located on the last page of this paperwork. If you have not heard from us regarding the results in 2 weeks, please contact this office.      

## 2016-07-05 NOTE — Progress Notes (Signed)
Chief Complaint  Patient presents with  . Establish Care  . Diabetes    HPI This is a patient who is reestablishing at this practice after 3 years This patient is new to this provider  Diabetes Mellitus: Patient presents for follow up of diabetes. Symptoms: weight loss. Symptoms have stabilized. Patient denies none.  Evaluation to date has been included: fasting blood sugar and hemoglobin A1CHome sugars: BGs are high in the morning, BGs are high in the evening, BGs are high around dinner, BGs are high at bedtime. Treatment to date: more intensive attention to diet which has been too early to assess effectiveness, low cholesterol diet which has been too early to assess effectiveness, weight loss of 70 lbs which has been too early to assess effectiveness, Discontinued sulfonylurea which has been discontinued because of side effects, Continued metformin which has been not very effective, Discontinued Actos which has been discontinued because of side effects, Discontinued statin which has been discontinued because of side effects and Discontinued ACE inhibitor/ARB which has been discontinued because of side effects.   Lab Results  Component Value Date   HGBA1C 9.9 (H) 03/14/2016   She reports that she has been on an [employer]  She struggled previously with weight loss She lost 70 pounds in the past year  She has tried Byetta, Glipizide, Actos which caused side effects. She stopped insulins and is now doing metformin with an atkins diet She is entering a diabetes management program with [place] to help improve her blood glucose which is in the high 200s She reports that she has not depression She reports that she just feels as though though medications has been making her very sick.  She exercises in front of her desk and will be tracking her steps and calories.  Sleep Apnea/ Ace inhibitor allergy Pt reports that she has been told that that her lisinopril was to protect her kidneys  and was on it until she developed a dry cough. Pulmonology stopped this. The Atkins diet improved her apnea but she still snores so she uses her cpap.  Hyperlipidemia She reports that she is not on a statin due to pain She is willing to try supplements but not statins She eats very lean means on the [employer] and is working with nutritionist She denies any chest pains  Past Medical History:  Diagnosis Date  . Allergy   . Bilateral shoulder pain   . Borderline hypertension   . Diabetes mellitus    Diagnosed in 2000  . Hyperlipidemia   . Obesity   . Sleep apnea     Current Outpatient Prescriptions  Medication Sig Dispense Refill  . albuterol (PROAIR HFA) 108 (90 BASE) MCG/ACT inhaler Inhale 2 puffs into the lungs every 6 (six) hours as needed. wheezing 18 g 2  . bismuth subsalicylate (PEPTO BISMOL) 262 MG chewable tablet Chew 524 mg by mouth as needed. Reported on 09/21/2015    . cetirizine (ZYRTEC) 10 MG tablet Take 10 mg by mouth daily.    . magnesium oxide (MAG-OX) 400 MG tablet Take 400 mg by mouth 2 (two) times daily.    . metFORMIN (GLUCOPHAGE) 1000 MG tablet Take 1 tablet (1,000 mg total) by mouth 2 (two) times daily with a meal. 180 tablet 2  . Multiple Vitamin (MULITIVITAMIN WITH MINERALS) TABS Take 1 tablet by mouth daily with breakfast.    . naproxen sodium (ANAPROX) 220 MG tablet Take 220 mg by mouth 2 (two) times daily as needed.    Marland Kitchen  UNABLE TO FIND Compression Stocking QD - 15-20 mmh     No current facility-administered medications for this visit.     Allergies:  Allergies  Allergen Reactions  . Exenatide     Flu-like symptoms  . Lisinopril Other (See Comments)    cramping  . Other Diarrhea    Kale=food  . Statins Other (See Comments)    Leg cramps    Past Surgical History:  Procedure Laterality Date  . BREATH TEK H PYLORI  09/18/2011   Procedure: BREATH TEK H PYLORI;  Surgeon: Shann Medal, MD;  Location: [facility];  Service: General;  Laterality:  N/A;  . CESAREAN SECTION     x 3    Social History   Social History  . Marital status: Married    Spouse name: N/A  . Number of children: N/A  . Years of education: N/A   Social History Main Topics  . Smoking status: Former Smoker    Packs/day: 1.00    Years: 15.00    Types: Cigarettes    Quit date: 01/28/1990  . Smokeless tobacco: Never Used  . Alcohol use 1.2 oz/week    1 Glasses of wine, 1 Cans of beer per week     Comment: rare  . Drug use: No  . Sexual activity: Not Currently   Other Topics Concern  . None   Social History Narrative  . None    Review of Systems  Constitutional: Positive for weight loss. Negative for chills and fever.  Respiratory: Negative for cough and wheezing.   Cardiovascular: Negative for chest pain, palpitations and leg swelling.  Gastrointestinal: Negative for nausea and vomiting.  Musculoskeletal: Negative for myalgias and neck pain.  Skin: Negative for itching and rash.  Psychiatric/Behavioral: Negative for depression and memory loss. The patient does not have insomnia.     Objective: Vitals:   07/05/16 1509 07/05/16 1555  BP: (!) 146/90 138/80  Pulse: 96   Resp: 16   Temp: 98.3 F (36.8 C)   SpO2: 98%   Weight: (!) 319 lb (144.7 kg)   Height: 5\' 7"  (1.702 m)    Body mass index is 49.96 kg/m.   Physical Exam  Constitutional: She is oriented to person, place, and time. She appears well-developed and well-nourished.  HENT:  Head: Normocephalic and atraumatic.  Eyes: Conjunctivae and EOM are normal.  Cardiovascular: Normal rate, regular rhythm and normal heart sounds.   No murmur heard. Pulmonary/Chest: Effort normal and breath sounds normal. No respiratory distress. She has no wheezes.  Musculoskeletal: Normal range of motion. She exhibits no edema.  Neurological: She is alert and oriented to person, place, and time.  Skin: Skin is warm. No erythema.  Psychiatric: She has a normal mood and affect. Her behavior is normal.  Judgment and thought content normal.    Assessment and Plan Mellony was seen today for establish care and diabetes.  Diagnoses and all orders for this visit:  Type 2 diabetes mellitus with complication, without long-term current use of insulin ([place])- awaiting a1c results Has been unchanged and seems to remain between 9 and 10 despite pts efforts Discussed that she should try the intensive monitoring and if remains between 9 and 10 to see Endocrinology She agreed to this plan A source of frustration is that the patient feels great and is doing much better with nutrition but not seeing results. -     Hemoglobin A1c  Morbid obesity with BMI of 45.0-49.9, adult ([place])- improved with 70#  weight loss  Mixed hyperlipidemia - discussed lifestyle modification Advised red yeast rice   A total of 50 minutes were spent face-to-face with the patient during this encounter and over half of that time was spent on counseling and coordination of care.    [place]

## 2016-07-06 LAB — HEMOGLOBIN A1C
HEMOGLOBIN A1C: 9.8 % — AB (ref <5.7)
MEAN PLASMA GLUCOSE: 235 mg/dL

## 2016-07-06 NOTE — Progress Notes (Signed)
Please change me from being her pcp.

## 2016-07-13 ENCOUNTER — Ambulatory Visit: Payer: Self-pay | Admitting: Pharmacist

## 2016-08-09 ENCOUNTER — Ambulatory Visit: Payer: 59 | Admitting: Family Medicine

## 2016-09-20 ENCOUNTER — Telehealth: Payer: 59 | Admitting: Family

## 2016-09-20 DIAGNOSIS — J209 Acute bronchitis, unspecified: Secondary | ICD-10-CM | POA: Diagnosis not present

## 2016-09-20 MED ORDER — BENZONATATE 100 MG PO CAPS: 100.0000 mg | ORAL_CAPSULE | Freq: Three times a day (TID) | ORAL | 0 refills | Status: DC | PRN

## 2016-09-20 MED ORDER — AMOXICILLIN-POT CLAVULANATE 875-125 MG PO TABS: 1.0000 | ORAL_TABLET | Freq: Two times a day (BID) | ORAL | 0 refills | Status: DC

## 2016-09-20 MED ORDER — AZITHROMYCIN 250 MG PO TABS: ORAL_TABLET | ORAL | 0 refills | Status: DC

## 2016-09-20 NOTE — Addendum Note (Signed)
Addended by: Evelina Dun A on: 09/20/2016 05:54 PM   Modules accepted: Orders

## 2016-09-20 NOTE — Progress Notes (Signed)

## 2016-09-26 MED FILL — metFORMIN HCL 1000 MG TABS: 1000 | 90 days supply | Qty: 180 | Fill #2

## 2016-09-27 ENCOUNTER — Encounter: Payer: Self-pay | Admitting: Family Medicine

## 2016-09-27 ENCOUNTER — Ambulatory Visit (INDEPENDENT_AMBULATORY_CARE_PROVIDER_SITE_OTHER): Payer: 59 | Admitting: Family Medicine

## 2016-09-27 VITALS — BP 131/77 | HR 98 | Temp 97.9°F | Resp 16 | Ht 67.0 in | Wt 319.8 lb

## 2016-09-27 DIAGNOSIS — Z6841 Body Mass Index (BMI) 40.0 and over, adult: Secondary | ICD-10-CM | POA: Diagnosis not present

## 2016-09-27 DIAGNOSIS — J209 Acute bronchitis, unspecified: Secondary | ICD-10-CM | POA: Diagnosis not present

## 2016-09-27 DIAGNOSIS — E1165 Type 2 diabetes mellitus with hyperglycemia: Secondary | ICD-10-CM | POA: Diagnosis not present

## 2016-09-27 LAB — GLUCOSE, POCT (MANUAL RESULT ENTRY): POC GLUCOSE: 273 mg/dL — AB (ref 70–99)

## 2016-09-27 LAB — POCT GLYCOSYLATED HEMOGLOBIN (HGB A1C): HEMOGLOBIN A1C: 11.4

## 2016-09-27 MED ORDER — BENZONATATE 100 MG PO CAPS: 100.0000 mg | ORAL_CAPSULE | Freq: Three times a day (TID) | ORAL | 0 refills | Status: DC | PRN

## 2016-09-27 MED ORDER — ALBUTEROL SULFATE HFA 108 (90 BASE) MCG/ACT IN AERS: 2.0000 | INHALATION_SPRAY | Freq: Four times a day (QID) | RESPIRATORY_TRACT | 2 refills | Status: DC | PRN

## 2016-09-27 MED FILL — VENTOLIN HFA 90 MCG INHALER: 108 (90 BAS | 25 days supply | Qty: 18 | Fill #0

## 2016-09-27 MED FILL — BENZONATATE 100 MG CAP: 100 | 7 days supply | Qty: 20 | Fill #0

## 2016-09-27 NOTE — Patient Instructions (Signed)
     IF you received an x-ray today, you will receive an invoice from [facility]. Please contact [facility] at [phone] with questions or concerns regarding your invoice.   IF you received labwork today, you will receive an invoice from [facility]. Please contact [facility] at [phone] with questions or concerns regarding your invoice.   Our billing staff will not be able to assist you with questions regarding bills from these companies.  You will be contacted with the lab results as soon as they are available. The fastest way to get your results is to activate your My Chart account. Instructions are located on the last page of this paperwork. If you have not heard from us regarding the results in 2 weeks, please contact this office.     

## 2016-09-27 NOTE — Progress Notes (Signed)
Chief Complaint  Patient presents with  . Follow-up    refill ProAir    HPI  Acute URI Pt is here to follow up after an evisit 09/10/16 She was prescribed augmentin because zpak doesn't work She reports that she was advised some mucinex, tessalon perles and albuterol.   Diabetes mellitus type 2 She reports that she is still working on her diabetes management She reports that she weighed 319 lbs Wt Readings from Last 3 Encounters:  09/27/16 (!) 319 lb 12.8 oz (145.1 kg)  07/05/16 (!) 319 lb (144.7 kg)  04/27/16 (!) 319 lb (144.7 kg)   She reports that her glucose levels have been  Lab Results  Component Value Date   HGBA1C 11.4 09/27/2016   Lab Results  Component Value Date   HGBA1C 11.4 09/27/2016    Her last fasting glucose was 255 documented 3 days ago. Her fasting glucose does not go below 200.  She states that she has been having cold symptoms and her glucometer stopped working.   Component     Latest Ref Rng & Units 09/27/2016  POC Glucose     70 - 99 mg/dl 273 (A)  Her last meal was 7pm on 09/26/2016  She reports that she was gaining weight on insulin and so she does not want to go on insulin.  She states that she does not want to go back on all these medications.    History from office visit 07/05/2016 Diabetes Mellitus: Patient presents for follow up of diabetes. Symptoms: weight loss. Symptoms have stabilized. Patient denies none.  Evaluation to date has been included: fasting blood sugar and hemoglobin A1CHome sugars: BGs are high in the morning, BGs are high in the evening, BGs are high around dinner, BGs are high at bedtime. Treatment to date: more intensive attention to diet which has been too early to assess effectiveness, low cholesterol diet which has been too early to assess effectiveness, weight loss of 70 lbs which has been too early to assess effectiveness, Discontinued sulfonylurea which has been discontinued because of side effects, Continued  metformin which has been not very effective, Discontinued Actos which has been discontinued because of side effects, Discontinued statin which has been discontinued because of side effects and Discontinued ACE inhibitor/ARB which has been discontinued because of side effects.   She has tried Invokana, Byetta, Glipizide, Actos which caused side effects. She stopped insulins and is now doing metformin with an atkins diet She is entering a diabetes management program with [place] to help improve her blood glucose which is in the high 200s She reports that she has not depression She reports that she just feels as though though medications has been making her very sick.  Past Medical History:  Diagnosis Date  . Allergy   . Bilateral shoulder pain   . Borderline hypertension   . Diabetes mellitus    Diagnosed in 2000  . Hyperlipidemia   . Obesity   . Sleep apnea     Current Outpatient Prescriptions  Medication Sig Dispense Refill  . albuterol (PROAIR HFA) 108 (90 Base) MCG/ACT inhaler Inhale 2 puffs into the lungs every 6 (six) hours as needed. wheezing 18 g 2  . bismuth subsalicylate (PEPTO BISMOL) 262 MG chewable tablet Chew 524 mg by mouth as needed. Reported on 09/21/2015    . cetirizine (ZYRTEC) 10 MG tablet Take 10 mg by mouth daily.    . magnesium oxide (MAG-OX) 400 MG tablet Take 400 mg by mouth 2 (  two) times daily.    . metFORMIN (GLUCOPHAGE) 1000 MG tablet Take 1 tablet (1,000 mg total) by mouth 2 (two) times daily with a meal. 180 tablet 2  . Multiple Vitamin (MULITIVITAMIN WITH MINERALS) TABS Take 1 tablet by mouth daily with breakfast.    . naproxen sodium (ANAPROX) 220 MG tablet Take 220 mg by mouth 2 (two) times daily as needed.    Marland Kitchen UNABLE TO FIND Compression Stocking QD - 15-20 mmh    . benzonatate (TESSALON PERLES) 100 MG capsule Take 1 capsule (100 mg total) by mouth 3 (three) times daily as needed for cough. 20 capsule 0   No current facility-administered medications  for this visit.     Allergies:  Allergies  Allergen Reactions  . Exenatide     Flu-like symptoms  . Lisinopril Other (See Comments)    cramping  . Other Diarrhea    Kale=food  . Statins Other (See Comments)    Leg cramps    Past Surgical History:  Procedure Laterality Date  . BREATH TEK H PYLORI  09/18/2011   Procedure: BREATH TEK H PYLORI;  Surgeon: Shann Medal, MD;  Location: [facility];  Service: General;  Laterality: N/A;  . CESAREAN SECTION     x 3    Social History   Social History  . Marital status: Married    Spouse name: N/A  . Number of children: N/A  . Years of education: N/A   Social History Main Topics  . Smoking status: Former Smoker    Packs/day: 1.00    Years: 15.00    Types: Cigarettes    Quit date: 01/28/1990  . Smokeless tobacco: Never Used  . Alcohol use 1.2 oz/week    1 Glasses of wine, 1 Cans of beer per week     Comment: rare  . Drug use: No  . Sexual activity: Not Currently   Other Topics Concern  . None   Social History Narrative  . None    Review of Systems  Constitutional: Negative for chills and fever.  Cardiovascular: Negative for chest pain, palpitations and leg swelling.  Gastrointestinal: Negative for abdominal pain, nausea and vomiting.  Genitourinary: Negative for dysuria, frequency and urgency.  Skin: Negative for itching and rash.  Neurological: Negative for dizziness, tingling and headaches.  Endo/Heme/Allergies: Negative for environmental allergies and polydipsia.    Objective: Vitals:   09/27/16 0948  BP: 131/77  Pulse: 98  Resp: 16  Temp: 97.9 F (36.6 C)  TempSrc: Oral  SpO2: 98%  Weight: (!) 319 lb 12.8 oz (145.1 kg)  Height: 5\' 7"  (1.702 m)    Physical Exam  Constitutional: She is oriented to person, place, and time. She appears well-developed and well-nourished.  HENT:  Head: Normocephalic and atraumatic.  Right Ear: External ear normal.  Left Ear: External ear normal.  Nose: Nose normal.    Mouth/Throat: Oropharynx is clear and moist. No oropharyngeal exudate.  Eyes: Conjunctivae and EOM are normal.  Cardiovascular: Normal rate, regular rhythm and normal heart sounds.   No murmur heard. Pulmonary/Chest: Effort normal and breath sounds normal. No respiratory distress. She has no wheezes.  Neurological: She is alert and oriented to person, place, and time.  Psychiatric: She has a normal mood and affect. Her behavior is normal. Judgment and thought content normal.    Assessment and Plan Marli was seen today for follow-up.  Diagnoses and all orders for this visit:  Uncontrolled type 2 diabetes mellitus with hyperglycemia, without long-term  current use of insulin ([place])- advised pt to follow up with Endocrinology Discussed that she might have an adrenal gland issue to investigate Given her morbid obesity and uncontrolled diabetes with a1c 11.4 will need insulin for coverage -     POCT glycosylated hemoglobin (Hb A1C) -     POCT glucose (manual entry) -     Ambulatory referral to Endocrinology  Acute bronchitis, unspecified organism- completed augmentin Discussed that expected course of the cough At this point her lungs are clear Explained that the cough will not resolve overnight but in a matter of weeks -     benzonatate (TESSALON PERLES) 100 MG capsule; Take 1 capsule (100 mg total) by mouth 3 (three) times daily as needed for cough. -     albuterol (PROAIR HFA) 108 (90 Base) MCG/ACT inhaler; Inhale 2 puffs into the lungs every 6 (six) hours as needed. wheezing  Morbid obesity- weight stabilized with intense dietary modification and walking daily  A total of 40 minutes were spent face-to-face with the patient during this encounter and over half of that time was spent on counseling and coordination of care.   [place]

## 2016-09-28 DIAGNOSIS — G4733 Obstructive sleep apnea (adult) (pediatric): Secondary | ICD-10-CM | POA: Diagnosis not present

## 2016-09-30 ENCOUNTER — Telehealth (HOSPITAL_COMMUNITY): Payer: Self-pay | Admitting: *Deleted

## 2016-09-30 ENCOUNTER — Ambulatory Visit (HOSPITAL_COMMUNITY)
Admission: EM | Admit: 2016-09-30 | Discharge: 2016-09-30 | Disposition: A | Discharge status: Home / Self Care | Payer: 59 | Attending: Family Medicine | Admitting: Family Medicine

## 2016-09-30 ENCOUNTER — Encounter (HOSPITAL_COMMUNITY): Payer: Self-pay | Admitting: *Deleted

## 2016-09-30 DIAGNOSIS — R197 Diarrhea, unspecified: Secondary | ICD-10-CM

## 2016-09-30 DIAGNOSIS — R112 Nausea with vomiting, unspecified: Secondary | ICD-10-CM | POA: Diagnosis not present

## 2016-09-30 DIAGNOSIS — R6889 Other general symptoms and signs: Secondary | ICD-10-CM | POA: Diagnosis not present

## 2016-09-30 HISTORY — DX: Bronchitis, not specified as acute or chronic: J40

## 2016-09-30 MED ORDER — OSELTAMIVIR PHOSPHATE 75 MG PO CAPS: 75.0000 mg | ORAL_CAPSULE | Freq: Two times a day (BID) | ORAL | 0 refills | Status: DC

## 2016-09-30 MED ORDER — ONDANSETRON 4 MG PO TBDP: 4.0000 mg | ORAL_TABLET | Freq: Three times a day (TID) | ORAL | 0 refills | Status: DC | PRN

## 2016-09-30 MED ORDER — LOPERAMIDE HCL 2 MG PO CAPS: 2.0000 mg | ORAL_CAPSULE | Freq: Four times a day (QID) | ORAL | 0 refills | Status: DC | PRN

## 2016-09-30 MED ORDER — ONDANSETRON 4 MG PO TBDP: 4.0000 mg | ORAL_TABLET | Freq: Once | ORAL | Status: DC

## 2016-09-30 NOTE — ED Triage Notes (Signed)
Has had a cough over past 2 wks that she felt had been improving.  Used albuterol HFA today.

## 2016-09-30 NOTE — Telephone Encounter (Signed)
Pt filling e-prescribed Rxs at [facility], states pharmacy is out of Tamiflu, requesting to get Tamiflu Rx sent to [facility].  Rx called in, verified stock of med.

## 2016-09-30 NOTE — ED Provider Notes (Signed)
CSN: [id number]     Arrival date & time 09/30/16  1807 History   First MD Initiated Contact with Patient 09/30/16 2006     Chief Complaint  Patient presents with  . Nausea  . Weakness   (Consider location/radiation/quality/duration/timing/severity/associated sxs/prior Treatment) 61 year old female presents to clinic with 12 hour history of nausea, vomiting, diarrhea, temperature. She has been taking tylenol for fever control, has vomited 4-5 times today, diarrhea started this evening. She has headache, muscle aches, body aches, congestion, fatigue, loss of appetite. Denies dizziness   The history is provided by the patient.  Weakness     Past Medical History:  Diagnosis Date  . Allergy   . Bilateral shoulder pain   . Borderline hypertension   . Bronchitis   . Diabetes mellitus    Diagnosed in 2000  . Hyperlipidemia   . Obesity   . Sleep apnea    Past Surgical History:  Procedure Laterality Date  . BREATH TEK H PYLORI  09/18/2011   Procedure: BREATH TEK H PYLORI;  Surgeon: Shann Medal, MD;  Location: [facility];  Service: General;  Laterality: N/A;  . CESAREAN SECTION     x 3   Family History  Problem Relation Age of Onset  . Alcohol abuse Mother   . Arthritis Mother   . Diabetes Mother   . Sleep apnea Mother   . Alcohol abuse Father   . Heart disease Father   . Sleep apnea Sister    Social History  Substance Use Topics  . Smoking status: Former Smoker    Packs/day: 1.00    Years: 15.00    Types: Cigarettes    Quit date: 01/28/1990  . Smokeless tobacco: Never Used  . Alcohol use No   OB History    No data available     Review of Systems  Neurological: Positive for weakness.  All other systems reviewed and are negative.   Allergies  Exenatide; Lisinopril; Other; and Statins  Home Medications   Prior to Admission medications   Medication Sig Start Date End Date Taking? Authorizing Provider  albuterol (PROAIR HFA) 108 (90 Base) MCG/ACT inhaler  Inhale 2 puffs into the lungs every 6 (six) hours as needed. wheezing 09/27/16  Yes Zoe A Nolon Rod, MD  cetirizine (ZYRTEC) 10 MG tablet Take 10 mg by mouth daily.   Yes Historical Provider, MD  magnesium oxide (MAG-OX) 400 MG tablet Take 400 mg by mouth 2 (two) times daily.   Yes Historical Provider, MD  metFORMIN (GLUCOPHAGE) 1000 MG tablet Take 1 tablet (1,000 mg total) by mouth 2 (two) times daily with a meal. 03/15/16  Yes Susy Frizzle, MD  Multiple Vitamin (MULITIVITAMIN WITH MINERALS) TABS Take 1 tablet by mouth daily with breakfast.   Yes Historical Provider, MD  benzonatate (TESSALON PERLES) 100 MG capsule Take 1 capsule (100 mg total) by mouth 3 (three) times daily as needed for cough. 09/27/16   [name], MD  bismuth subsalicylate (PEPTO BISMOL) 262 MG chewable tablet Chew 524 mg by mouth as needed. Reported on 09/21/2015    Historical Provider, MD  loperamide (IMODIUM) 2 MG capsule Take 1 capsule (2 mg total) by mouth 4 (four) times daily as needed for diarrhea or loose stools. 09/30/16   Barnet Glasgow, NP  naproxen sodium (ANAPROX) 220 MG tablet Take 220 mg by mouth 2 (two) times daily as needed.    Historical Provider, MD  ondansetron (ZOFRAN ODT) 4 MG disintegrating tablet Take 1  tablet (4 mg total) by mouth every 8 (eight) hours as needed for nausea or vomiting. 09/30/16   Barnet Glasgow, NP  oseltamivir (TAMIFLU) 75 MG capsule Take 1 capsule (75 mg total) by mouth every 12 (twelve) hours. 09/30/16   Barnet Glasgow, NP  UNABLE TO FIND Compression Stocking QD - 15-20 mmh    Historical Provider, MD   Meds Ordered and Administered this Visit   Medications  ondansetron (ZOFRAN-ODT) disintegrating tablet 4 mg (not administered)    BP 148/80   Pulse 108   Temp 98.4 F (36.9 C)   Resp 22   SpO2 96%  No data found.   Physical Exam  Constitutional: She is oriented to person, place, and time. She appears well-developed and well-nourished. She appears ill. No distress.   HENT:  Head: Normocephalic and atraumatic.  Right Ear: Tympanic membrane and external ear normal.  Left Ear: Tympanic membrane and external ear normal.  Nose: Nose normal. Right sinus exhibits no maxillary sinus tenderness and no frontal sinus tenderness. Left sinus exhibits no maxillary sinus tenderness and no frontal sinus tenderness.  Mouth/Throat: Uvula is midline, oropharynx is clear and moist and mucous membranes are normal. No oropharyngeal exudate. Tonsils are 1+ on the right. Tonsils are 1+ on the left.  Eyes: Pupils are equal, round, and reactive to light.  Neck: Normal range of motion. Neck supple. No JVD present.  Cardiovascular: Normal rate and regular rhythm.   Pulmonary/Chest: Effort normal and breath sounds normal. No respiratory distress. She has no wheezes. She has no rhonchi.  Abdominal: Soft. Bowel sounds are normal. She exhibits no distension. There is no tenderness. There is no rebound, no guarding, no CVA tenderness, no tenderness at McBurney's point and negative Murphy's sign.  Lymphadenopathy:       Head (right side): No submandibular and no tonsillar adenopathy present.       Head (left side): No submandibular and no tonsillar adenopathy present.    She has no cervical adenopathy.  Neurological: She is alert and oriented to person, place, and time.  Skin: Skin is warm. Capillary refill takes less than 2 seconds. She is diaphoretic.  Psychiatric: She has a normal mood and affect.  Nursing note and vitals reviewed.   Urgent Care Course     Procedures (including critical care time)  Labs Review Labs Reviewed - No data to display  Imaging Review No results found.   Visual Acuity Review  Right Eye Distance:   Left Eye Distance:   Bilateral Distance:    Right Eye Near:   Left Eye Near:    Bilateral Near:         MDM   1. Flu-like symptoms   2. Nausea vomiting and diarrhea   Based on your symptoms, I think you most likely have a viral illness. I  believe the flu is the most likely cause. I have sent a prescription for Tamiflu to your pharmacy. Take 1 tablet twice a day for 5 days. For nausea, take Zofran, 1 tablet under the tongue every 8 hours. For diarrhea, I have prescribed loperamide. Take 1 tablet now then 1 after each loose stool, do not go over more than 4 tablets a day.  For fever, take tylenol every 4 hours or alternate with ibuprofen. Claritin for congestion or flonase two sprays each nostril each day.  Should your symptoms fail to improve follow up with your primary care provider or return to clinic.       Barnet Glasgow, NP  09/30/16 2021  

## 2016-09-30 NOTE — Discharge Instructions (Signed)
Based on your symptoms, I think you most likely have a viral illness. I believe the flu is the most likely cause. I have sent a prescription for Tamiflu to your pharmacy. Take 1 tablet twice a day for 5 days. For nausea, take Zofran, 1 tablet under the tongue every 8 hours. For diarrhea, I have prescribed loperamide. Take 1 tablet now then 1 after each loose stool, do not go over more than 4 tablets a day.  For fever, take tylenol every 4 hours or alternate with ibuprofen. Claritin for congestion or flonase two sprays each nostril each day.  Should your symptoms fail to improve follow up with your primary care provider or return to clinic.

## 2016-09-30 NOTE — ED Triage Notes (Signed)
Diagnosed with URI last wk; has been on Augmentin and OTC sinus meds.  Today feeling worse - n/v, weakness, diaphoresis.  Denies dizziness or fevers.

## 2016-10-16 ENCOUNTER — Encounter: Payer: Self-pay | Admitting: Endocrinology

## 2016-11-09 ENCOUNTER — Ambulatory Visit: Payer: 59 | Admitting: Endocrinology

## 2016-11-14 ENCOUNTER — Encounter: Payer: Self-pay | Admitting: Family Medicine

## 2016-11-14 DIAGNOSIS — H52223 Regular astigmatism, bilateral: Secondary | ICD-10-CM | POA: Diagnosis not present

## 2016-11-14 DIAGNOSIS — H524 Presbyopia: Secondary | ICD-10-CM | POA: Diagnosis not present

## 2016-11-14 DIAGNOSIS — E113293 Type 2 diabetes mellitus with mild nonproliferative diabetic retinopathy without macular edema, bilateral: Secondary | ICD-10-CM | POA: Diagnosis not present

## 2016-11-14 LAB — HM DIABETES EYE EXAM

## 2016-11-29 ENCOUNTER — Ambulatory Visit: Payer: 59 | Admitting: Family Medicine

## 2017-01-18 ENCOUNTER — Encounter: Payer: Self-pay | Admitting: Family Medicine

## 2017-01-18 ENCOUNTER — Ambulatory Visit (INDEPENDENT_AMBULATORY_CARE_PROVIDER_SITE_OTHER): Payer: 59 | Admitting: Family Medicine

## 2017-01-18 VITALS — BP 125/81 | HR 98 | Temp 98.1°F | Resp 16 | Ht 67.25 in | Wt 319.4 lb

## 2017-01-18 DIAGNOSIS — E119 Type 2 diabetes mellitus without complications: Secondary | ICD-10-CM | POA: Diagnosis not present

## 2017-01-18 LAB — POCT GLYCOSYLATED HEMOGLOBIN (HGB A1C): HEMOGLOBIN A1C: 11.9

## 2017-01-18 MED ORDER — METFORMIN HCL 1000 MG PO TABS: 1000.0000 mg | ORAL_TABLET | Freq: Two times a day (BID) | ORAL | 2 refills | Status: DC

## 2017-01-18 MED FILL — metFORMIN HCL 1000 MG TABS: 1000 | 90 days supply | Qty: 180 | Fill #0

## 2017-01-18 NOTE — Progress Notes (Signed)
Terri Heath is a 61 y.o. female who presents to Primary Care at [facility] today for metformin refill:  1.  Metformin refill:  Patient with longstanding history of type 2 diabetes. In the past she has been on multiple oral medications including insulin. She reports intolerances to all these medicines. She experienced weight gain with insulin. She is very concerned about restarting any other medicine besides metformin. She has not had any side effects with metformin.  Currently she is only on the metformin. She does not check her blood sugars daily. She's had no hypoglycemic events. Denies any polyuria, polydipsia, paresthesias, foot pain, peripheral nerve pain.      Results for orders placed or performed in visit on 01/18/17  POCT glycosylated hemoglobin (Hb A1C)  Result Value Ref Range   Hemoglobin A1C 11.9      ROS as above.  Pertinently, no chest pain, palpitations, SOB, Fever, Chills, Abd pain, N/V/D.   PMH reviewed. Patient is a nonsmoker.   Past Medical History:  Diagnosis Date  . Allergy   . Bilateral shoulder pain   . Borderline hypertension   . Bronchitis   . Diabetes mellitus    Diagnosed in 2000  . Hyperlipidemia   . Obesity   . Sleep apnea    Past Surgical History:  Procedure Laterality Date  . BREATH TEK H PYLORI  09/18/2011   Procedure: BREATH TEK H PYLORI;  Surgeon: Shann Medal, MD;  Location: [facility];  Service: General;  Laterality: N/A;  . CESAREAN SECTION     x 3    Medications reviewed. Current Outpatient Prescriptions  Medication Sig Dispense Refill  . albuterol (PROAIR HFA) 108 (90 Base) MCG/ACT inhaler Inhale 2 puffs into the lungs every 6 (six) hours as needed. wheezing 18 g 2  . cetirizine (ZYRTEC) 10 MG tablet Take 10 mg by mouth daily.    . metFORMIN (GLUCOPHAGE) 1000 MG tablet Take 1 tablet (1,000 mg total) by mouth 2 (two) times daily with a meal. 180 tablet 2  . Multiple Vitamin (MULITIVITAMIN WITH MINERALS) TABS Take 1 tablet by  mouth daily with breakfast.    . naproxen sodium (ANAPROX) 220 MG tablet Take 220 mg by mouth 2 (two) times daily as needed.    . bismuth subsalicylate (PEPTO BISMOL) 262 MG chewable tablet Chew 524 mg by mouth as needed. Reported on 09/21/2015    . loperamide (IMODIUM) 2 MG capsule Take 1 capsule (2 mg total) by mouth 4 (four) times daily as needed for diarrhea or loose stools. (Patient not taking: Reported on 01/18/2017) 12 capsule 0  . magnesium oxide (MAG-OX) 400 MG tablet Take 400 mg by mouth 2 (two) times daily.    . ondansetron (ZOFRAN ODT) 4 MG disintegrating tablet Take 1 tablet (4 mg total) by mouth every 8 (eight) hours as needed for nausea or vomiting. (Patient not taking: Reported on 01/18/2017) 20 tablet 0  . UNABLE TO FIND Compression Stocking QD - 15-20 mmh     No current facility-administered medications for this visit.      Physical Exam:  BP 125/81 (BP Location: Right Arm, Patient Position: Sitting, Cuff Size: Large)   Pulse 98   Temp 98.1 F (36.7 C) (Oral)   Resp 16   Ht 5' 7.25" (1.708 m)   Wt (!) 319 lb 6.4 oz (144.9 kg)   SpO2 93%   BMI 49.65 kg/m  Gen:  Alert, cooperative patient who appears stated age in no acute distress.  Vital  signs reviewed. HEENT: EOMI,  MMM Pulm:  Clear to auscultation bilaterally with good air movement.  No wheezes or rales noted.   Cardiac:  RRR Exts: Non edematous BL  LE, warm and well perfused.   Assessment and Plan:  1.  DM2, uncontrolled. - refilled metformin today.  - A1C continues to rise. Patient VERY hesitant about starting any other medicines - she did not keep appt with endocrine due to family concerns. - states she is starting a weight and eating plan through work at [employer] in June.   - I will place referral for endocrinology again since A1C continues to rise.  She will likely need insulin again

## 2017-01-18 NOTE — Patient Instructions (Addendum)
  It was good to see you today.  We'll let you  Know the results of your A1C.i  I have refilled your Metformin.  I think that you are going to need another medicine beyond just Metformin to keep your blood sugar under control.       IF you received an x-ray today, you will receive an invoice from [facility]. Please contact [facility] at [phone] with questions or concerns regarding your invoice.   IF you received labwork today, you will receive an invoice from [place]. Please contact [facility] at [phone] with questions or concerns regarding your invoice.   Our billing staff will not be able to assist you with questions regarding bills from these companies.  You will be contacted with the lab results as soon as they are available. The fastest way to get your results is to activate your My Chart account. Instructions are located on the last page of this paperwork. If you have not heard from [place] regarding the results in 2 weeks, please contact this office.    We recommend that you schedule a mammogram for breast cancer screening. Typically, you do not need a referral to do this. Please contact a local imaging center to schedule your mammogram.  [facility] - [phone]  *ask for the Radiology Department [facility] - [phone] or [phone]  [facility] - [phone] [place] [phone] [facility] - [phone]  *ask for the [facility] - [phone]  *ask for the Radiology Department [facility] - [phone]  *ask for the [place] - [phone]

## 2017-05-07 ENCOUNTER — Ambulatory Visit (INDEPENDENT_AMBULATORY_CARE_PROVIDER_SITE_OTHER): Payer: 59 | Admitting: Family Medicine

## 2017-05-07 ENCOUNTER — Encounter: Payer: Self-pay | Admitting: Family Medicine

## 2017-05-07 VITALS — BP 141/88 | HR 106 | Temp 98.1°F | Resp 17 | Ht 67.25 in | Wt 323.4 lb

## 2017-05-07 DIAGNOSIS — Z23 Encounter for immunization: Secondary | ICD-10-CM | POA: Diagnosis not present

## 2017-05-07 DIAGNOSIS — Z6841 Body Mass Index (BMI) 40.0 and over, adult: Secondary | ICD-10-CM

## 2017-05-07 DIAGNOSIS — M5441 Lumbago with sciatica, right side: Secondary | ICD-10-CM

## 2017-05-07 LAB — POCT URINALYSIS DIP (MANUAL ENTRY)
BILIRUBIN UA: NEGATIVE
Leukocytes, UA: NEGATIVE
Nitrite, UA: NEGATIVE
Protein Ur, POC: NEGATIVE mg/dL
RBC UA: NEGATIVE
SPEC GRAV UA: 1.02 (ref 1.010–1.025)
Urobilinogen, UA: 0.2 E.U./dL
pH, UA: 5 (ref 5.0–8.0)

## 2017-05-07 MED ORDER — DICLOFENAC SODIUM 1 % TD GEL: 2.0000 g | Freq: Four times a day (QID) | TRANSDERMAL | 1 refills | Status: DC

## 2017-05-07 MED FILL — DICLOFENAC SODIUM 1% GEL: 1 | 13 days supply | Qty: 100 | Fill #0

## 2017-05-07 MED FILL — metFORMIN HCL 1000 MG TABS: 1000 | 90 days supply | Qty: 180 | Fill #1

## 2017-05-07 NOTE — Progress Notes (Signed)
Chief Complaint  Patient presents with  . Back Pain    radiates down hip to leg, x 3 days, using ice and took tylenol before leaving home-helps some, pain so bad caused pt to vomit yesterday    HPI   New problem- back pain Pt is a morbidly obese diabetic female who reports that she has been having back pain for 4 days She states that her right hip and thigh felt like they were inflamed so she stayed in bed She took aspirin, tylenol then after that she tried alleve She states that she has back pain in the low back  When she presses on her back pain then the right hip and thigh pain went away She report that over the weekend she tried icing Yesterday she was in bed all day She states that her last episode like this was 8 years ago and seemed to improve with a tennis ball which helped at that time. This time she rolled up a tennis sock and put under her lower back which seemed to help. Prior to her onset of the back pain she was sitting for a long period of time.     Morbid obesity- worsened problem She states that she was exercising this summer in her above ground pool. She also walks. This back pain has stopped her from exercising. She denies shortness of breath or chest pain with exercise. Wt Readings from Last 3 Encounters:  05/07/17 (!) 323 lb 6.4 oz (146.7 kg)  01/18/17 (!) 319 lb 6.4 oz (144.9 kg)  09/27/16 (!) 319 lb 12.8 oz (145.1 kg)     Past Medical History:  Diagnosis Date  . Allergy   . Bilateral shoulder pain   . Borderline hypertension   . Bronchitis   . Diabetes mellitus    Diagnosed in 2000  . Hyperlipidemia   . Obesity   . Sleep apnea     Current Outpatient Prescriptions  Medication Sig Dispense Refill  . albuterol (PROAIR HFA) 108 (90 Base) MCG/ACT inhaler Inhale 2 puffs into the lungs every 6 (six) hours as needed. wheezing 18 g 2  . bismuth subsalicylate (PEPTO BISMOL) 262 MG chewable tablet Chew 524 mg by mouth as needed. Reported on 09/21/2015    .  cetirizine (ZYRTEC) 10 MG tablet Take 10 mg by mouth daily.    . magnesium oxide (MAG-OX) 400 MG tablet Take 400 mg by mouth 2 (two) times daily.    . metFORMIN (GLUCOPHAGE) 1000 MG tablet Take 1 tablet (1,000 mg total) by mouth 2 (two) times daily with a meal. 180 tablet 2  . Multiple Vitamin (MULITIVITAMIN WITH MINERALS) TABS Take 1 tablet by mouth daily with breakfast.    . naproxen sodium (ANAPROX) 220 MG tablet Take 220 mg by mouth 2 (two) times daily as needed.    Marland Kitchen UNABLE TO FIND Compression Stocking QD - 15-20 mmh    . diclofenac sodium (VOLTAREN) 1 % GEL Apply 2 g topically 4 (four) times daily. 100 g 1  . loperamide (IMODIUM) 2 MG capsule Take 1 capsule (2 mg total) by mouth 4 (four) times daily as needed for diarrhea or loose stools. (Patient not taking: Reported on 01/18/2017) 12 capsule 0  . ondansetron (ZOFRAN ODT) 4 MG disintegrating tablet Take 1 tablet (4 mg total) by mouth every 8 (eight) hours as needed for nausea or vomiting. (Patient not taking: Reported on 01/18/2017) 20 tablet 0   No current facility-administered medications for this visit.  Allergies:  Allergies  Allergen Reactions  . Exenatide     Flu-like symptoms  . Lisinopril Other (See Comments)    cramping  . Other Diarrhea    Kale=food  . Statins Other (See Comments)    Leg cramps    Past Surgical History:  Procedure Laterality Date  . BREATH TEK H PYLORI  09/18/2011   Procedure: BREATH TEK H PYLORI;  Surgeon: Shann Medal, MD;  Location: [facility];  Service: General;  Laterality: N/A;  . CESAREAN SECTION     x 3    Social History   Social History  . Marital status: Married    Spouse name: N/A  . Number of children: N/A  . Years of education: N/A   Social History Main Topics  . Smoking status: Former Smoker    Packs/day: 1.00    Years: 15.00    Types: Cigarettes    Quit date: 01/28/1990  . Smokeless tobacco: Never Used  . Alcohol use No  . Drug use: No  . Sexual activity: Not  Currently   Other Topics Concern  . None   Social History Narrative  . None    Review of Systems  Constitutional: Negative for chills and fever.  Respiratory: Negative for cough and shortness of breath.   Cardiovascular: Negative for chest pain and leg swelling.  Gastrointestinal: Positive for vomiting.       One episode of emesis  Musculoskeletal: Positive for back pain and joint pain.  Skin: Negative for itching and rash.  Neurological: Negative for dizziness and headaches.    Objective: Vitals:   05/07/17 0953  BP: (!) 141/88  Pulse: (!) 106  Resp: 17  Temp: 98.1 F (36.7 C)  TempSrc: Oral  SpO2: 95%  Weight: (!) 323 lb 6.4 oz (146.7 kg)  Height: 5' 7.25" (1.708 m)    Physical Exam  Constitutional: She appears well-developed and well-nourished.  Obese   HENT:  Head: Normocephalic and atraumatic.  Eyes: Conjunctivae and EOM are normal.  Cardiovascular: Normal rate, regular rhythm and normal heart sounds.   Pulmonary/Chest: Effort normal and breath sounds normal. No respiratory distress. She has no wheezes.  Musculoskeletal:       Lumbar back: She exhibits decreased range of motion, tenderness and spasm. She exhibits no bony tenderness, no swelling, no edema, no deformity, no laceration and no pain.       Back:  Neurological: She is alert.    Assessment and Plan Timmya was seen today for back pain.  Diagnoses and all orders for this visit:  Acute right-sided low back pain with right-sided sciatica- referred to PT and will continue nsaid and voltaren gel -     POCT urinalysis dipstick -     Basic metabolic panel -     CBC -     Ambulatory referral to Physical Therapy -     diclofenac sodium (VOLTAREN) 1 % GEL; Apply 2 g topically 4 (four) times daily.  Morbid obesity with BMI of 45.0-49.9, adult ([place])- continue exercise and weight loss  Need for prophylactic vaccination and inoculation against influenza -     Flu Vaccine QUAD 36+ mos IM     Caroline Longie  A [place]

## 2017-05-07 NOTE — Patient Instructions (Addendum)
IF you received an x-ray today, you will receive an invoice from [facility]. Please contact [facility] at [phone] with questions or concerns regarding your invoice.   IF you received labwork today, you will receive an invoice from [facility]. Please contact [facility] at [phone] with questions or concerns regarding your invoice.   Our billing staff will not be able to assist you with questions regarding bills from these companies.  You will be contacted with the lab results as soon as they are available. The fastest way to get your results is to activate your My Chart account. Instructions are located on the last page of this paperwork. If you have not heard from [place] regarding the results in 2 weeks, please contact this office.    We recommend that you schedule a mammogram for breast cancer screening. Typically, you do not need a referral to do this. Please contact a local imaging center to schedule your mammogram.  [facility] - [phone]  *ask for the Radiology Department [facility] ([place]) - [phone] or [phone]  [facility] - [phone] [place] [phone] [facility] - [phone]  *ask for the [facility] - [phone]  *ask for the Radiology Department [facility] - [phone]  *ask for the [place] - [phone] Sciatica Sciatica is pain, numbness, weakness, or tingling along the path of the sciatic nerve. The sciatic nerve starts in the lower back and runs down the back of each leg. The nerve controls the muscles in the lower leg and in the back of the knee. It also provides feeling (sensation) to the back of the thigh, the lower leg, and the sole of the foot. Sciatica is a symptom of another medical condition that pinches or puts pressure on the sciatic nerve. Generally,  sciatica only affects one side of the body. Sciatica usually goes away on its own or with treatment. In some cases, sciatica may keep coming back (recur). What are the causes? This condition is caused by pressure on the sciatic nerve, or pinching of the sciatic nerve. This may be the result of:  A disk in between the bones of the spine (vertebrae) bulging out too far (herniated disk).  Age-related changes in the spinal disks (degenerative disk disease).  A pain disorder that affects a muscle in the buttock (piriformis syndrome).  Extra bone growth (bone spur) near the sciatic nerve.  An injury or break (fracture) of the pelvis.  Pregnancy.  Tumor (rare).  What increases the risk? The following factors may make you more likely to develop this condition:  Playing sports that place pressure or stress on the spine, such as football or weight lifting.  Having poor strength and flexibility.  A history of back injury.  A history of back surgery.  Sitting for long periods of time.  Doing activities that involve repetitive bending or lifting.  Obesity.  What are the signs or symptoms? Symptoms can vary from mild to very severe, and they may include:  Any of these problems in the lower back, leg, hip, or buttock: ? Mild tingling or dull aches. ? Burning sensations. ? Sharp pains.  Numbness in the back of the calf or the sole of the foot.  Leg weakness.  Severe back pain that makes movement difficult.  These symptoms may get worse when you cough, sneeze, or laugh, or when you sit  or stand for long periods of time. Being overweight may also make symptoms worse. In some cases, symptoms may recur over time. How is this diagnosed? This condition may be diagnosed based on:  Your symptoms.  A physical exam. Your health care provider may ask you to do certain movements to check whether those movements trigger your symptoms.  You may have tests, including: ? Blood  tests. ? X-rays. ? MRI. ? CT scan.  How is this treated? In many cases, this condition improves on its own, without any treatment. However, treatment may include:  Reducing or modifying physical activity during periods of pain.  Exercising and stretching to strengthen your abdomen and improve the flexibility of your spine.  Icing and applying heat to the affected area.  Medicines that help: ? To relieve pain and swelling. ? To relax your muscles.  Injections of medicines that help to relieve pain, irritation, and inflammation around the sciatic nerve (steroids).  Surgery.  Follow these instructions at home: Medicines  Take over-the-counter and prescription medicines only as told by your health care provider.  Do not drive or operate heavy machinery while taking prescription pain medicine. Managing pain  If directed, apply ice to the affected area. ? Put ice in a plastic bag. ? Place a towel between your skin and the bag. ? Leave the ice on for 20 minutes, 2-3 times a day.  After icing, apply heat to the affected area before you exercise or as often as told by your health care provider. Use the heat source that your health care provider recommends, such as a moist heat pack or a heating pad. ? Place a towel between your skin and the heat source. ? Leave the heat on for 20-30 minutes. ? Remove the heat if your skin turns bright red. This is especially important if you are unable to feel pain, heat, or cold. You may have a greater risk of getting burned. Activity  Return to your normal activities as told by your health care provider. Ask your health care provider what activities are safe for you. ? Avoid activities that make your symptoms worse.  Take brief periods of rest throughout the day. Resting in a lying or standing position is usually better than sitting to rest. ? When you rest for longer periods, mix in some mild activity or stretching between periods of rest. This  will help to prevent stiffness and pain. ? Avoid sitting for long periods of time without moving. Get up and move around at least one time each hour.  Exercise and stretch regularly, as told by your health care provider.  Do not lift anything that is heavier than 10 lb (4.5 kg) while you have symptoms of sciatica. When you do not have symptoms, you should still avoid heavy lifting, especially repetitive heavy lifting.  When you lift objects, always use proper lifting technique, which includes: ? Bending your knees. ? Keeping the load close to your body. ? Avoiding twisting. General instructions  Use good posture. ? Avoid leaning forward while sitting. ? Avoid hunching over while standing.  Maintain a healthy weight. Excess weight puts extra stress on your back and makes it difficult to maintain good posture.  Wear supportive, comfortable shoes. Avoid wearing high heels.  Avoid sleeping on a mattress that is too soft or too hard. A mattress that is firm enough to support your back when you sleep may help to reduce your pain.  Keep all follow-up visits as told by your  health care provider. This is important. Contact a health care provider if:  You have pain that wakes you up when you are sleeping.  You have pain that gets worse when you lie down.  Your pain is worse than you have experienced in the past.  Your pain lasts longer than 4 weeks.  You experience unexplained weight loss. Get help right away if:  You lose control of your bowel or bladder (incontinence).  You have: ? Weakness in your lower back, pelvis, buttocks, or legs that gets worse. ? Redness or swelling of your back. ? A burning sensation when you urinate. This information is not intended to replace advice given to you by your health care provider. Make sure you discuss any questions you have with your health care provider. Document Released: 08/08/2001 Document Revised: 01/18/2016 Document Reviewed:  04/23/2015 Elsevier Interactive Patient Education  2017 [employer].

## 2017-05-08 LAB — CBC
Hematocrit: 48.5 % — ABNORMAL HIGH (ref 34.0–46.6)
Hemoglobin: 15.7 g/dL (ref 11.1–15.9)
MCH: 27.8 pg (ref 26.6–33.0)
MCHC: 32.4 g/dL (ref 31.5–35.7)
MCV: 86 fL (ref 79–97)
PLATELETS: 196 10*3/uL (ref 150–379)
RBC: 5.64 x10E6/uL — ABNORMAL HIGH (ref 3.77–5.28)
RDW: 14 % (ref 12.3–15.4)
WBC: 6.2 10*3/uL (ref 3.4–10.8)

## 2017-05-08 LAB — BASIC METABOLIC PANEL
BUN/Creatinine Ratio: 18 (ref 12–28)
BUN: 12 mg/dL (ref 8–27)
CO2: 19 mmol/L — AB (ref 20–29)
Calcium: 9.2 mg/dL (ref 8.7–10.3)
Chloride: 98 mmol/L (ref 96–106)
Creatinine, Ser: 0.67 mg/dL (ref 0.57–1.00)
GFR, EST AFRICAN AMERICAN: 110 mL/min/{1.73_m2} (ref >59)
GFR, EST NON AFRICAN AMERICAN: 96 mL/min/{1.73_m2} (ref >59)
Glucose: 321 mg/dL — ABNORMAL HIGH (ref 65–99)
POTASSIUM: 4.8 mmol/L (ref 3.5–5.2)
SODIUM: 136 mmol/L (ref 134–144)

## 2017-05-22 ENCOUNTER — Telehealth: Payer: Self-pay | Admitting: Family Medicine

## 2017-05-22 NOTE — Telephone Encounter (Signed)
Megan from physical therapy and hand called stated that pt refused services stated that she didn't want to do physical therapy at this time.Marland Kitchen

## 2017-05-30 NOTE — Telephone Encounter (Signed)
Noted  

## 2017-06-06 ENCOUNTER — Telehealth: Payer: Self-pay

## 2017-06-06 NOTE — Telephone Encounter (Signed)
When calling to talk to patient about overdue health maintenance, we updated what she has had, and she still needs a colonoscopy and a PAP.  She states she will CB after the first of the year for a PAP, and she will contact her GI doctor regarding the colonoscopy.  I told her that would be fine.  She thanked me for the call today stating that it is a very nice thing we are doing for our patients.

## 2017-07-29 ENCOUNTER — Telehealth: Payer: 59 | Admitting: Family

## 2017-07-29 DIAGNOSIS — R399 Unspecified symptoms and signs involving the genitourinary system: Secondary | ICD-10-CM | POA: Diagnosis not present

## 2017-07-29 MED ORDER — CEPHALEXIN 500 MG PO CAPS: 500.0000 mg | ORAL_CAPSULE | Freq: Two times a day (BID) | ORAL | 0 refills | Status: DC

## 2017-07-29 NOTE — Progress Notes (Signed)

## 2017-09-04 ENCOUNTER — Other Ambulatory Visit: Payer: Self-pay | Admitting: Family Medicine

## 2017-09-04 DIAGNOSIS — Z1231 Encounter for screening mammogram for malignant neoplasm of breast: Secondary | ICD-10-CM

## 2017-09-05 ENCOUNTER — Encounter: Payer: Self-pay | Admitting: Adult Health

## 2017-09-06 ENCOUNTER — Ambulatory Visit (INDEPENDENT_AMBULATORY_CARE_PROVIDER_SITE_OTHER): Payer: 59 | Admitting: Adult Health

## 2017-09-06 ENCOUNTER — Encounter: Payer: Self-pay | Admitting: Adult Health

## 2017-09-06 VITALS — BP 138/86 | HR 99 | Ht 67.0 in | Wt 328.0 lb

## 2017-09-06 DIAGNOSIS — J45909 Unspecified asthma, uncomplicated: Secondary | ICD-10-CM | POA: Diagnosis not present

## 2017-09-06 DIAGNOSIS — G4733 Obstructive sleep apnea (adult) (pediatric): Secondary | ICD-10-CM | POA: Diagnosis not present

## 2017-09-06 NOTE — Patient Instructions (Addendum)
Continue on CPAP At bedtime   Order for new machine .  Keep up good work .  Work on healthy weight .  Do not drive if sleepy.  Use ProAir As needed  Wheezing  Follow up with Dr. Halford Chessman  In 1 year and As needed

## 2017-09-06 NOTE — Assessment & Plan Note (Signed)
Controlled w/out flare   Plan  .proair As needed

## 2017-09-06 NOTE — Progress Notes (Signed)
@Patient  ID: Terri Heath, female    DOB: [birth year], 62 y.o.   MRN: [id number]  Chief Complaint  Patient presents with  . 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 She has gained about 30 pounds since the study.  Download 10/8 -07/03/11 >>good complaince ,GOod control of events on 12 cm  SPirometry showed FEv1 64% with ratio of 72, FVC was 71% - mild restriction  09/06/17 Follow up; Asthma and OSA  Pt returns for one-year follow-up.  Patient has known moderate sleep apnea.  She is on CPAP.  Says that she is doing very well on CPAP.  She feels rested.  She now has no significant daytime sleepiness.  Says that she can complete a full day of work without feeling sleepy.  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 Download shows excellent compliance with average usage at 8 hours.  She is on CPAP 12 cm H2O.  AHI is 1.0. Patient says that her CPAP machine is getting all.  She does request a new CPAP machine.  Patient has some mild intermittent asthma.  She says overall is been doing well.  She denies any increased cough or wheezing.  She says the cold air sometimes can cause her to feel a little tight.  She denies any increased [employer] use.         Allergies  Allergen Reactions  . Exenatide     Flu-like symptoms  . Lisinopril Other (See Comments)    cramping  . Other Diarrhea    Kale=food  . Statins Other (See Comments)    Leg cramps    Immunization History  Administered Date(s) Administered  . Influenza Split 05/23/2011, 05/09/2012  . Influenza,inj,Quad PF,6+ Mos 05/07/2017  . MMR 11/29/2000  . Pneumococcal Polysaccharide-23 08/25/2005  . Td 08/25/2005    Past Medical History:  Diagnosis Date  . Allergy   . Bilateral  shoulder pain   . Borderline hypertension   . Bronchitis   . Diabetes mellitus    Diagnosed in 2000  . Hyperlipidemia   . Obesity   . Sleep apnea     Tobacco History: Social History   Tobacco Use  Smoking Status Former Smoker  . Packs/day: 1.00  . Years: 15.00  . Pack years: 15.00  . Types: Cigarettes  . Last attempt to quit: 01/28/1990  . Years since quitting: 27.6  Smokeless Tobacco Never Used   Counseling given: Not Answered   Outpatient Encounter Medications as of 09/06/2017  Medication Sig  . albuterol (PROAIR HFA) 108 (90 Base) MCG/ACT inhaler Inhale 2 puffs into the lungs every 6 (six) hours as needed. wheezing  . bismuth subsalicylate (PEPTO BISMOL) 262 MG chewable tablet Chew 524 mg by mouth as needed. Reported on 09/21/2015  . cetirizine (ZYRTEC) 10 MG tablet Take 10 mg by mouth daily.  . magnesium oxide (MAG-OX) 400 MG tablet Take 400 mg by mouth 2 (two) times daily.  . metFORMIN (GLUCOPHAGE) 1000 MG tablet Take 1 tablet (1,000 mg total) by mouth 2 (two) times daily with a meal.  . Multiple Vitamin (MULITIVITAMIN WITH MINERALS) TABS Take 1 tablet by mouth daily with breakfast.  . naproxen sodium (ANAPROX) 220  MG tablet Take 220 mg by mouth 2 (two) times daily as needed.  . diclofenac sodium (VOLTAREN) 1 % GEL Apply 2 g topically 4 (four) times daily. (Patient not taking: Reported on 09/06/2017)  . loperamide (IMODIUM) 2 MG capsule Take 1 capsule (2 mg total) by mouth 4 (four) times daily as needed for diarrhea or loose stools. (Patient not taking: Reported on 09/06/2017)  . ondansetron (ZOFRAN ODT) 4 MG disintegrating tablet Take 1 tablet (4 mg total) by mouth every 8 (eight) hours as needed for nausea or vomiting. (Patient not taking: Reported on 09/06/2017)  . UNABLE TO FIND Compression Stocking QD - 15-20 mmh  . [DISCONTINUED] cephALEXin (KEFLEX) 500 MG capsule Take 1 capsule (500 mg total) by mouth 2 (two) times daily. (Patient not taking: Reported on 09/06/2017)   No  facility-administered encounter medications on file as of 09/06/2017.      Review of Systems  Constitutional:   No  weight loss, night sweats,  Fevers, chills, fatigue, or  lassitude.  HEENT:   No headaches,  Difficulty swallowing,  Tooth/dental problems, or  Sore throat,                No sneezing, itching, ear ache,  +nasal congestion, post nasal drip,   CV:  No chest pain,  Orthopnea, PND, swelling in lower extremities, anasarca, dizziness, palpitations, syncope.   GI  No heartburn, indigestion, abdominal pain, nausea, vomiting, diarrhea, change in bowel habits, loss of appetite, bloody stools.   Resp: No shortness of breath with exertion or at rest.  No excess mucus, no productive cough,  No non-productive cough,  No coughing up of blood.  No change in color of mucus.  No wheezing.  No chest wall deformity  Skin: no rash or lesions.  GU: no dysuria, change in color of urine, no urgency or frequency.  No flank pain, no hematuria   MS:  No joint pain or swelling.  No decreased range of motion.  No back pain.    Physical Exam  BP 138/86 (BP Location: Left Arm, Cuff Size: Large)   Pulse 99   Ht 5' 7"  (1.702 m)   Wt (!) 328 lb (148.8 kg)   SpO2 93%   BMI 51.37 kg/m   GEN: A/Ox3; pleasant , NAD, obese   HEENT:  [place]/AT,  EACs-clear, TMs-wnl, NOSE-clear, THROAT-clear, no lesions, no postnasal drip or exudate noted.   NECK:  Supple w/ fair ROM; no JVD; normal carotid impulses w/o bruits; no thyromegaly or nodules palpated; no lymphadenopathy.    RESP  Clear  P & A; w/o, wheezes/ rales/ or rhonchi. no accessory muscle use, no dullness to percussion  CARD:  RRR, no m/r/g,  trace peripheral edema, pulses intact, no cyanosis or clubbing.  GI:   Soft & nt; nml bowel sounds; no organomegaly or masses detected.   Musco: Warm bil, no deformities or joint swelling noted.   Neuro: alert, no focal deficits noted.    Skin: Warm, no lesions or rashes    Lab  Results:  CBC    BNP No results found for: BNP  ProBNP No results found for: PROBNP  Imaging: No results found.   Assessment & Plan:   OSA (obstructive sleep apnea) Well-controlled on CPAP  Plan Patient Instructions  Continue on CPAP At bedtime   Order for new machine .  Keep up good work .  Work on healthy weight .  Do not drive if sleepy.  Use ProAir As needed  Wheezing  Follow up with Dr. Halford Chessman  In 1 year and As needed          Morbid obesity, weight 368, BMI 57.7. Wt loss   Asthma due to seasonal allergies Controlled w/out flare   Plan  .proair As needed       Rexene Edison, NP 09/06/2017

## 2017-09-06 NOTE — Progress Notes (Signed)
I have reviewed and agree with assessment/plan.  Ashya Nicolaisen, MD [facility] 09/06/2017, 7:48 PM Pager:  [phone]  

## 2017-09-06 NOTE — Assessment & Plan Note (Signed)
Wt loss  

## 2017-09-06 NOTE — Assessment & Plan Note (Signed)
Well-controlled on CPAP  Plan Patient Instructions  Continue on CPAP At bedtime   Order for new machine .  Keep up good work .  Work on healthy weight .  Do not drive if sleepy.  Use ProAir As needed  Wheezing  Follow up with Dr. Halford Chessman  In 1 year and As needed

## 2017-09-07 ENCOUNTER — Telehealth: Payer: Self-pay | Admitting: Pulmonary Disease

## 2017-09-07 DIAGNOSIS — G4733 Obstructive sleep apnea (adult) (pediatric): Secondary | ICD-10-CM | POA: Diagnosis not present

## 2017-09-07 NOTE — Telephone Encounter (Signed)
Order sent to [facility]

## 2017-09-07 NOTE — Telephone Encounter (Signed)
According to the chart, the ast CPAP setting was for 12cm H20. TP note below. Order placed. FYI PCC's  09/06/17 Follow up; Asthma and OSA  Pt returns for one-year follow-up.  Patient has known moderate sleep apnea.  She is on CPAP.  Says that she is doing very well on CPAP.  She feels rested.  She now has no significant daytime sleepiness.  Says that she can complete a full day of work without feeling sleepy.  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 Download shows excellent compliance with average usage at 8 hours.  She is on CPAP 12 cm H2O.  AHI is 1.0. Patient says that her CPAP machine is getting all.  She does request a new CPAP machine.  Patient has some mild intermittent asthma.  She says overall is been doing well.  She denies any increased cough or wheezing.  She says the cold air sometimes can cause her to feel a little tight.  She denies any increased [employer] use.

## 2017-09-11 ENCOUNTER — Encounter: Payer: Self-pay | Admitting: Nurse Practitioner

## 2017-09-11 ENCOUNTER — Ambulatory Visit (INDEPENDENT_AMBULATORY_CARE_PROVIDER_SITE_OTHER): Payer: Self-pay | Admitting: Nurse Practitioner

## 2017-09-11 VITALS — BP 120/90 | HR 110 | Temp 98.2°F | Wt 331.0 lb

## 2017-09-11 DIAGNOSIS — L03818 Cellulitis of other sites: Secondary | ICD-10-CM

## 2017-09-11 MED ORDER — MUPIROCIN 2 % EX OINT: 1.0000 "application " | TOPICAL_OINTMENT | Freq: Two times a day (BID) | CUTANEOUS | 0 refills | Status: AC

## 2017-09-11 MED ORDER — DOXYCYCLINE HYCLATE 100 MG PO TABS: 100.0000 mg | ORAL_TABLET | Freq: Two times a day (BID) | ORAL | 0 refills | Status: AC

## 2017-09-11 MED ORDER — CEPHALEXIN 500 MG PO CAPS: 500.0000 mg | ORAL_CAPSULE | Freq: Three times a day (TID) | ORAL | 0 refills | Status: AC

## 2017-09-11 MED FILL — DOXYCYCLINE HYCLATE 100 MG: 100 | 10 days supply | Qty: 20 | Fill #0

## 2017-09-11 MED FILL — MUPIROCIN 2% OINTMENT: 2 | 10 days supply | Qty: 22 | Fill #0

## 2017-09-11 MED FILL — CEPHALEXIN 500 MG CAPSULE: 500 | 10 days supply | Qty: 30 | Fill #0

## 2017-09-11 NOTE — Progress Notes (Signed)
Subjective:    Terri Heath is a 62 y.o. female who presents for evaluation of a possible skin infection located lower abdomen left inner upper thigh. Symptoms include mild pain, erythema located lower abdomen x 2 and left inner upper thigh and discomfort. Patient denies chills, fever greater than 100 and foul smelling drainage. Precipitating event: break in the skin and uncontrolled DM. Treatment to date has included none with no relief.  Patient has a history of cellulitis to her lower abdomen. Pertinent hx of uncontrolled DM.  The following portions of the patient's history were reviewed and updated as appropriate: allergies, current medications and past medical history.  Review of Systems Constitutional: negative Respiratory: negative Cardiovascular: negative Gastrointestinal: negative Integument/breast: positive for rash, skin color change and lower abdomen, negative for dryness and pruritus Behavioral/Psych: negative     Objective:    BP 120/90   Pulse (!) 110   Temp 98.2 F (36.8 C)   Wt (!) 331 lb (150.1 kg)   SpO2 97%   BMI 51.84 kg/m  General appearance: alert, cooperative and no distress Head: Normocephalic, without obvious abnormality, atraumatic Lungs: clear to auscultation bilaterally Heart: regular rate and rhythm, S1, S2 normal, no murmur, click, rub or gallop Abdomen: soft, non-tender; bowel sounds normal; no masses,  no organomegaly Pulses: 2+ and symmetric Skin: erythema - abdomen, upper leg(s) left and excoriation - abdomen, two areas of ulceration to the abdomen, 1st area mostly excoriated, no drainage, erythematous base, measures approximately 2.5cm in diameter, 2nd location on abdomen with ulcerated edges, clear drainage at present, erythematous base, measures approximately 1.5cm in diameter, 3rd location on left upper inner thigh, ulcerated, clear drainage, erythematous base, measuring approximately 2cm in diameter. All areas warm to palpation. Lymph nodes:  cervical nodes normal     Assessment:    Cellulitis of the lower abdomen and left superior inner thigh.    Plan:    Keflex and Doxycycline and Mupirocin prescribed. Neurosurgeon distributed. Pain medication: Ibuprofen as needed.  Warm compresses.  Use dial anti-bacterial soap or Hibiclens soap to clean areas twice daily.  Follow up in ER if streaking, foul smelling drainage, fever, chills.    Discussed with patient the importance of controlling her blood sugars.  Patient also voice concern about her recent weight gain.

## 2017-09-11 NOTE — Patient Instructions (Addendum)
Cellulitis, Adult Cellulitis is a skin infection. The infected area is usually red and tender. This condition occurs most often in the arms and lower legs. The infection can travel to the muscles, blood, and underlying tissue and become serious. It is very important to get treated for this condition. What are the causes? Cellulitis is caused by bacteria. The bacteria enter through a break in the skin, such as a cut, burn, insect bite, open sore, or crack. What increases the risk? This condition is more likely to occur in people who:  Have a weak defense system (immune system).  Have open wounds on the skin such as cuts, burns, bites, and scrapes. Bacteria can enter the body through these open wounds.  Are older.  Have diabetes.  Have a type of long-lasting (chronic) liver disease (cirrhosis) or kidney disease.  Use IV drugs.  What are the signs or symptoms? Symptoms of this condition include:  Redness, streaking, or spotting on the skin.  Swollen area of the skin.  Tenderness or pain when an area of the skin is touched.  Warm skin.  Fever.  Chills.  Blisters.  How is this diagnosed? This condition is diagnosed based on a medical history and physical exam. You may also have tests, including:  Blood tests.  Lab tests.  Imaging tests.  How is this treated? Treatment for this condition may include:  Medicines, such as antibiotic medicines or antihistamines.  Supportive care, such as rest and application of cold or warm cloths (cold or warm compresses) to the skin.  Hospital care, if the condition is severe.  The infection usually gets better within 1-2 days of treatment. Follow these instructions at home:  Take over-the-counter and prescription medicines only as told by your health care provider.  If you were prescribed an antibiotic medicine, take it as told by your health care provider. Do not stop taking the antibiotic even if you start to feel  better.  Drink enough fluid to keep your urine clear or pale yellow.  Do not touch or rub the infected area.  Raise (elevate) the infected area above the level of your heart while you are sitting or lying down.  Apply warm or cold compresses to the affected area as told by your health care provider.  Keep all follow-up visits as told by your health care provider. This is important. These visits let your health care provider make sure a more serious infection is not developing.  Use Dial anti-bacterial soap or Hibiclens soap to clean the affected area.  Take Keflex 3 times daily and Doxycycline twice daily for 10 days.  Apply Mupirocin ointment to the affected area twice daily for 10 days. Contact a health care provider if:  You have a fever.  Your symptoms do not improve within 1-2 days of starting treatment.  Your bone or joint underneath the infected area becomes painful after the skin has healed.  Your infection returns in the same area or another area.  You notice a swollen bump in the infected area.  You develop new symptoms.  You have a general ill feeling (malaise) with muscle aches and pains. Get help right away if:  Your symptoms get worse.  You feel very sleepy.  You develop vomiting or diarrhea that persists.  You notice red streaks coming from the infected area.  Your red area gets larger or turns dark in color. This information is not intended to replace advice given to you by your health care provider. Make  sure you discuss any questions you have with your health care provider. Document Released: 05/24/2005 Document Revised: 12/23/2015 Document Reviewed: 06/23/2015 Elsevier Interactive Patient Education  [employer].

## 2017-09-13 ENCOUNTER — Telehealth: Payer: Self-pay

## 2017-09-13 NOTE — Telephone Encounter (Signed)
Called to f/u with pt to see how she was feeling since her visit the other day and she states everything is going well.

## 2017-09-18 DIAGNOSIS — G4733 Obstructive sleep apnea (adult) (pediatric): Secondary | ICD-10-CM | POA: Diagnosis not present

## 2017-10-03 ENCOUNTER — Emergency Department (HOSPITAL_COMMUNITY): Payer: 59 | Admitting: Registered Nurse

## 2017-10-03 ENCOUNTER — Encounter (HOSPITAL_COMMUNITY): Payer: Self-pay | Admitting: Emergency Medicine

## 2017-10-03 ENCOUNTER — Ambulatory Visit (INDEPENDENT_AMBULATORY_CARE_PROVIDER_SITE_OTHER): Payer: 59 | Admitting: Family Medicine

## 2017-10-03 ENCOUNTER — Encounter (HOSPITAL_COMMUNITY)
Admission: EM | Disposition: A | Discharge status: Home / Self Care | Payer: Self-pay | Source: Home / Self Care | Attending: Emergency Medicine

## 2017-10-03 ENCOUNTER — Observation Stay (HOSPITAL_COMMUNITY)
Admission: EM | Admit: 2017-10-03 | Discharge: 2017-10-04 | Disposition: A | Discharge status: Home / Self Care | Payer: 59 | Attending: Physician Assistant | Admitting: Physician Assistant

## 2017-10-03 ENCOUNTER — Emergency Department (HOSPITAL_COMMUNITY): Payer: 59

## 2017-10-03 ENCOUNTER — Encounter: Payer: Self-pay | Admitting: Family Medicine

## 2017-10-03 ENCOUNTER — Other Ambulatory Visit: Payer: Self-pay

## 2017-10-03 VITALS — BP 131/85 | HR 123 | Temp 98.9°F | Resp 16 | Ht 67.0 in | Wt 314.2 lb

## 2017-10-03 DIAGNOSIS — Z836 Family history of other diseases of the respiratory system: Secondary | ICD-10-CM | POA: Diagnosis not present

## 2017-10-03 DIAGNOSIS — D72828 Other elevated white blood cell count: Secondary | ICD-10-CM

## 2017-10-03 DIAGNOSIS — Z7984 Long term (current) use of oral hypoglycemic drugs: Secondary | ICD-10-CM | POA: Insufficient documentation

## 2017-10-03 DIAGNOSIS — R5383 Other fatigue: Secondary | ICD-10-CM | POA: Diagnosis not present

## 2017-10-03 DIAGNOSIS — G4733 Obstructive sleep apnea (adult) (pediatric): Secondary | ICD-10-CM | POA: Diagnosis not present

## 2017-10-03 DIAGNOSIS — R110 Nausea: Secondary | ICD-10-CM | POA: Diagnosis not present

## 2017-10-03 DIAGNOSIS — Z7982 Long term (current) use of aspirin: Secondary | ICD-10-CM | POA: Insufficient documentation

## 2017-10-03 DIAGNOSIS — I10 Essential (primary) hypertension: Secondary | ICD-10-CM | POA: Diagnosis not present

## 2017-10-03 DIAGNOSIS — Z833 Family history of diabetes mellitus: Secondary | ICD-10-CM | POA: Diagnosis not present

## 2017-10-03 DIAGNOSIS — Z6841 Body Mass Index (BMI) 40.0 and over, adult: Secondary | ICD-10-CM | POA: Diagnosis not present

## 2017-10-03 DIAGNOSIS — Z888 Allergy status to other drugs, medicaments and biological substances status: Secondary | ICD-10-CM | POA: Insufficient documentation

## 2017-10-03 DIAGNOSIS — Z791 Long term (current) use of non-steroidal anti-inflammatories (NSAID): Secondary | ICD-10-CM | POA: Insufficient documentation

## 2017-10-03 DIAGNOSIS — E119 Type 2 diabetes mellitus without complications: Secondary | ICD-10-CM | POA: Insufficient documentation

## 2017-10-03 DIAGNOSIS — Z9889 Other specified postprocedural states: Secondary | ICD-10-CM | POA: Insufficient documentation

## 2017-10-03 DIAGNOSIS — K3532 Acute appendicitis with perforation and localized peritonitis, without abscess: Secondary | ICD-10-CM

## 2017-10-03 DIAGNOSIS — Z91018 Allergy to other foods: Secondary | ICD-10-CM | POA: Insufficient documentation

## 2017-10-03 DIAGNOSIS — Z811 Family history of alcohol abuse and dependence: Secondary | ICD-10-CM | POA: Insufficient documentation

## 2017-10-03 DIAGNOSIS — R6510 Systemic inflammatory response syndrome (SIRS) of non-infectious origin without acute organ dysfunction: Secondary | ICD-10-CM | POA: Diagnosis not present

## 2017-10-03 DIAGNOSIS — I152 Hypertension secondary to endocrine disorders: Secondary | ICD-10-CM | POA: Diagnosis present

## 2017-10-03 DIAGNOSIS — K35891 Other acute appendicitis without perforation, with gangrene: Secondary | ICD-10-CM | POA: Diagnosis not present

## 2017-10-03 DIAGNOSIS — J309 Allergic rhinitis, unspecified: Secondary | ICD-10-CM | POA: Diagnosis present

## 2017-10-03 DIAGNOSIS — E65 Localized adiposity: Secondary | ICD-10-CM

## 2017-10-03 DIAGNOSIS — E1169 Type 2 diabetes mellitus with other specified complication: Secondary | ICD-10-CM | POA: Diagnosis present

## 2017-10-03 DIAGNOSIS — Z79899 Other long term (current) drug therapy: Secondary | ICD-10-CM | POA: Insufficient documentation

## 2017-10-03 DIAGNOSIS — R197 Diarrhea, unspecified: Secondary | ICD-10-CM | POA: Diagnosis not present

## 2017-10-03 DIAGNOSIS — E1159 Type 2 diabetes mellitus with other circulatory complications: Secondary | ICD-10-CM | POA: Diagnosis present

## 2017-10-03 DIAGNOSIS — E785 Hyperlipidemia, unspecified: Secondary | ICD-10-CM | POA: Diagnosis not present

## 2017-10-03 DIAGNOSIS — Z87891 Personal history of nicotine dependence: Secondary | ICD-10-CM | POA: Insufficient documentation

## 2017-10-03 DIAGNOSIS — K3580 Unspecified acute appendicitis: Secondary | ICD-10-CM

## 2017-10-03 DIAGNOSIS — R000 Tachycardia, unspecified: Secondary | ICD-10-CM

## 2017-10-03 DIAGNOSIS — E1165 Type 2 diabetes mellitus with hyperglycemia: Secondary | ICD-10-CM

## 2017-10-03 DIAGNOSIS — E66813 Obesity, class 3: Secondary | ICD-10-CM | POA: Diagnosis present

## 2017-10-03 DIAGNOSIS — Z8249 Family history of ischemic heart disease and other diseases of the circulatory system: Secondary | ICD-10-CM | POA: Diagnosis not present

## 2017-10-03 DIAGNOSIS — J45909 Unspecified asthma, uncomplicated: Secondary | ICD-10-CM | POA: Diagnosis not present

## 2017-10-03 DIAGNOSIS — R1031 Right lower quadrant pain: Secondary | ICD-10-CM | POA: Diagnosis not present

## 2017-10-03 DIAGNOSIS — R109 Unspecified abdominal pain: Secondary | ICD-10-CM | POA: Diagnosis not present

## 2017-10-03 HISTORY — PX: LAPAROSCOPIC APPENDECTOMY: SHX408

## 2017-10-03 LAB — POCT CBC
Granulocyte percent: 76.9 %G (ref 37–80)
HEMATOCRIT: 50.8 % — AB (ref 37.7–47.9)
HEMOGLOBIN: 16.5 g/dL — AB (ref 12.2–16.2)
LYMPH, POC: 2.9 (ref 0.6–3.4)
MCH, POC: 27.5 pg (ref 27–31.2)
MCHC: 32.5 g/dL (ref 31.8–35.4)
MCV: 84.6 fL (ref 80–97)
MID (cbc): 0.6 (ref 0–0.9)
MPV: 6.8 fL (ref 0–99.8)
PLATELET COUNT, POC: 365 10*3/uL (ref 142–424)
POC GRANULOCYTE: 11.7 — AB (ref 2–6.9)
POC LYMPH %: 19 % (ref 10–50)
POC MID %: 4.1 %M (ref 0–12)
RBC: 6 M/uL — AB (ref 4.04–5.48)
RDW, POC: 14.2 %
WBC: 15.2 10*3/uL — AB (ref 4.6–10.2)

## 2017-10-03 LAB — POCT URINALYSIS DIP (MANUAL ENTRY)
Blood, UA: NEGATIVE
Glucose, UA: 500 mg/dL — AB
Leukocytes, UA: NEGATIVE
Nitrite, UA: NEGATIVE
SPEC GRAV UA: 1.025 (ref 1.010–1.025)
Urobilinogen, UA: 0.2 E.U./dL
pH, UA: 5.5 (ref 5.0–8.0)

## 2017-10-03 LAB — COMPREHENSIVE METABOLIC PANEL
ALK PHOS: 65 U/L (ref 38–126)
ALT: 25 U/L (ref 14–54)
ANION GAP: 14 (ref 5–15)
AST: 19 U/L (ref 15–41)
Albumin: 3.7 g/dL (ref 3.5–5.0)
BILIRUBIN TOTAL: 0.8 mg/dL (ref 0.3–1.2)
BUN: 10 mg/dL (ref 6–20)
CALCIUM: 8.9 mg/dL (ref 8.9–10.3)
CO2: 23 mmol/L (ref 22–32)
CREATININE: 0.64 mg/dL (ref 0.44–1.00)
Chloride: 95 mmol/L — ABNORMAL LOW (ref 101–111)
Glucose, Bld: 287 mg/dL — ABNORMAL HIGH (ref 65–99)
Potassium: 4.6 mmol/L (ref 3.5–5.1)
SODIUM: 132 mmol/L — AB (ref 135–145)
TOTAL PROTEIN: 8.2 g/dL — AB (ref 6.5–8.1)

## 2017-10-03 LAB — POC MICROSCOPIC URINALYSIS (UMFC): MUCUS RE: ABSENT

## 2017-10-03 LAB — GLUCOSE, POCT (MANUAL RESULT ENTRY): POC GLUCOSE: 268 mg/dL — AB (ref 70–99)

## 2017-10-03 LAB — CBC WITH DIFFERENTIAL/PLATELET
Basophils Absolute: 0 10*3/uL (ref 0.0–0.1)
Basophils Relative: 0 %
EOS ABS: 0 10*3/uL (ref 0.0–0.7)
Eosinophils Relative: 0 %
HEMATOCRIT: 46.7 % — AB (ref 36.0–46.0)
HEMOGLOBIN: 16.1 g/dL — AB (ref 12.0–15.0)
Lymphocytes Relative: 11 %
Lymphs Abs: 1.6 10*3/uL (ref 0.7–4.0)
MCH: 28.6 pg (ref 26.0–34.0)
MCHC: 34.5 g/dL (ref 30.0–36.0)
MCV: 83.1 fL (ref 78.0–100.0)
MONOS PCT: 7 %
Monocytes Absolute: 1 10*3/uL (ref 0.1–1.0)
NEUTROS PCT: 82 %
Neutro Abs: 12.1 10*3/uL — ABNORMAL HIGH (ref 1.7–7.7)
Platelets: 255 10*3/uL (ref 150–400)
RBC: 5.62 MIL/uL — ABNORMAL HIGH (ref 3.87–5.11)
RDW: 13.5 % (ref 11.5–15.5)
WBC: 14.7 10*3/uL — ABNORMAL HIGH (ref 4.0–10.5)

## 2017-10-03 LAB — LIPASE, BLOOD: LIPASE: 23 U/L (ref 11–51)

## 2017-10-03 SURGERY — APPENDECTOMY, LAPAROSCOPIC
Anesthesia: General | Site: Abdomen

## 2017-10-03 MED ORDER — LACTATED RINGERS IV SOLN: INTRAVENOUS | Status: DC

## 2017-10-03 MED ORDER — RINGERS IRRIGATION IR SOLN
Status: DC | PRN
  Administered 2017-10-03: 1000 mL

## 2017-10-03 MED ORDER — FENTANYL CITRATE (PF) 100 MCG/2ML IJ SOLN
25.0000 ug | INTRAMUSCULAR | Status: DC | PRN
  Administered 2017-10-04: 50 ug via INTRAVENOUS

## 2017-10-03 MED ORDER — ONDANSETRON HCL 4 MG/2ML IJ SOLN
INTRAMUSCULAR | Status: DC | PRN
  Administered 2017-10-03: 4 mg via INTRAVENOUS

## 2017-10-03 MED ORDER — PROPOFOL 10 MG/ML IV BOLUS
INTRAVENOUS | Status: DC | PRN
  Administered 2017-10-03: 240 mg via INTRAVENOUS

## 2017-10-03 MED ORDER — LIDOCAINE 2% (20 MG/ML) 5 ML SYRINGE
INTRAMUSCULAR | Status: AC
  Filled 2017-10-03: qty 5

## 2017-10-03 MED ORDER — MIDAZOLAM HCL 5 MG/5ML IJ SOLN
INTRAMUSCULAR | Status: DC | PRN
  Administered 2017-10-03: 1 mg via INTRAVENOUS

## 2017-10-03 MED ORDER — PHENYLEPHRINE HCL 10 MG/ML IJ SOLN
INTRAMUSCULAR | Status: DC | PRN
  Administered 2017-10-03: 80 ug via INTRAVENOUS
  Administered 2017-10-03: 120 ug via INTRAVENOUS

## 2017-10-03 MED ORDER — SODIUM CHLORIDE 0.9 % IR SOLN
Status: DC | PRN
  Administered 2017-10-03: 1000 mL

## 2017-10-03 MED ORDER — FENTANYL CITRATE (PF) 250 MCG/5ML IJ SOLN
INTRAMUSCULAR | Status: AC
  Filled 2017-10-03: qty 5

## 2017-10-03 MED ORDER — LIDOCAINE HCL (CARDIAC) 20 MG/ML IV SOLN
INTRAVENOUS | Status: DC | PRN
  Administered 2017-10-03: 75 mg via INTRAVENOUS
  Administered 2017-10-03: 25 mg via INTRATRACHEAL

## 2017-10-03 MED ORDER — LACTATED RINGERS IV SOLN
INTRAVENOUS | Status: DC | PRN
  Administered 2017-10-03 – 2017-10-04 (×2): via INTRAVENOUS

## 2017-10-03 MED ORDER — FENTANYL CITRATE (PF) 100 MCG/2ML IJ SOLN
INTRAMUSCULAR | Status: DC | PRN
  Administered 2017-10-03 (×2): 50 ug via INTRAVENOUS
  Administered 2017-10-03: 100 ug via INTRAVENOUS
  Administered 2017-10-03: 50 ug via INTRAVENOUS

## 2017-10-03 MED ORDER — ONDANSETRON HCL 4 MG/2ML IJ SOLN
INTRAMUSCULAR | Status: AC
  Filled 2017-10-03: qty 2

## 2017-10-03 MED ORDER — PIPERACILLIN-TAZOBACTAM 3.375 G IVPB 30 MIN
3.3750 g | Freq: Once | INTRAVENOUS | Status: AC
  Administered 2017-10-03: 3.375 g via INTRAVENOUS
  Filled 2017-10-03: qty 50

## 2017-10-03 MED ORDER — DEXAMETHASONE SODIUM PHOSPHATE 10 MG/ML IJ SOLN
INTRAMUSCULAR | Status: AC
  Filled 2017-10-03: qty 1

## 2017-10-03 MED ORDER — PROPOFOL 10 MG/ML IV BOLUS
INTRAVENOUS | Status: AC
  Filled 2017-10-03: qty 40

## 2017-10-03 MED ORDER — SODIUM CHLORIDE 0.9 % IV BOLUS (SEPSIS)
1000.0000 mL | Freq: Once | INTRAVENOUS | Status: AC
  Administered 2017-10-03: 1000 mL via INTRAVENOUS

## 2017-10-03 MED ORDER — SUCCINYLCHOLINE CHLORIDE 200 MG/10ML IV SOSY
PREFILLED_SYRINGE | INTRAVENOUS | Status: AC
  Filled 2017-10-03: qty 10

## 2017-10-03 MED ORDER — SUCCINYLCHOLINE CHLORIDE 20 MG/ML IJ SOLN
INTRAMUSCULAR | Status: DC | PRN
  Administered 2017-10-03: 140 mg via INTRAVENOUS

## 2017-10-03 MED ORDER — ROCURONIUM BROMIDE 10 MG/ML (PF) SYRINGE
PREFILLED_SYRINGE | INTRAVENOUS | Status: AC
  Filled 2017-10-03: qty 5

## 2017-10-03 MED ORDER — IOPAMIDOL (ISOVUE-300) INJECTION 61%
INTRAVENOUS | Status: AC
  Administered 2017-10-03: 100 mL
  Filled 2017-10-03: qty 100

## 2017-10-03 MED ORDER — INSULIN ASPART 100 UNIT/ML ~~LOC~~ SOLN
SUBCUTANEOUS | Status: AC
  Filled 2017-10-03: qty 1

## 2017-10-03 MED ORDER — BUPIVACAINE-EPINEPHRINE (PF) 0.5% -1:200000 IJ SOLN
INTRAMUSCULAR | Status: AC
  Filled 2017-10-03: qty 30

## 2017-10-03 MED ORDER — MEPERIDINE HCL 50 MG/ML IJ SOLN: 6.2500 mg | INTRAMUSCULAR | Status: DC | PRN

## 2017-10-03 MED ORDER — SODIUM CHLORIDE 0.9 % IJ SOLN
INTRAMUSCULAR | Status: AC
  Filled 2017-10-03: qty 50

## 2017-10-03 MED ORDER — LABETALOL HCL 5 MG/ML IV SOLN
INTRAVENOUS | Status: DC | PRN
  Administered 2017-10-03: 5 mg via INTRAVENOUS

## 2017-10-03 MED ORDER — METOCLOPRAMIDE HCL 5 MG/ML IJ SOLN: 10.0000 mg | Freq: Once | INTRAMUSCULAR | Status: DC | PRN

## 2017-10-03 MED ORDER — HYDROMORPHONE HCL 1 MG/ML IJ SOLN
1.0000 mg | Freq: Once | INTRAMUSCULAR | Status: AC
  Administered 2017-10-03: 1 mg via INTRAVENOUS
  Filled 2017-10-03: qty 1

## 2017-10-03 MED ORDER — ONDANSETRON HCL 4 MG/2ML IJ SOLN
4.0000 mg | Freq: Once | INTRAMUSCULAR | Status: AC
  Administered 2017-10-03: 4 mg via INTRAVENOUS
  Filled 2017-10-03: qty 2

## 2017-10-03 MED ORDER — MIDAZOLAM HCL 2 MG/2ML IJ SOLN
INTRAMUSCULAR | Status: AC
  Filled 2017-10-03: qty 2

## 2017-10-03 MED ORDER — SODIUM CHLORIDE 0.9 % IV BOLUS (SEPSIS)
1000.0000 mL | Freq: Once | INTRAVENOUS | Status: AC
  Administered 2017-10-03 (×2): 1000 mL via INTRAVENOUS

## 2017-10-03 SURGICAL SUPPLY — 52 items
ADH SKN CLS APL DERMABOND .7 (GAUZE/BANDAGES/DRESSINGS) ×1
APPLIER CLIP 5 13 M/L LIGAMAX5 (MISCELLANEOUS)
APPLIER CLIP ROT 10 11.4 M/L (STAPLE)
APR CLP MED LRG 11.4X10 (STAPLE)
APR CLP MED LRG 5 ANG JAW (MISCELLANEOUS)
BAG SPEC RTRVL LRG 6X4 10 (ENDOMECHANICALS) ×1
CABLE HIGH FREQUENCY MONO STRZ (ELECTRODE) ×3 IMPLANT
CHLORAPREP W/TINT 26ML (MISCELLANEOUS) ×6 IMPLANT
CLIP APPLIE 5 13 M/L LIGAMAX5 (MISCELLANEOUS) IMPLANT
CLIP APPLIE ROT 10 11.4 M/L (STAPLE) IMPLANT
DECANTER SPIKE VIAL GLASS SM (MISCELLANEOUS) ×3 IMPLANT
DERMABOND ADVANCED (GAUZE/BANDAGES/DRESSINGS) ×2
DERMABOND ADVANCED .7 DNX12 (GAUZE/BANDAGES/DRESSINGS) ×1 IMPLANT
DEVICE PMI PUNCTURE CLOSURE (MISCELLANEOUS) ×2 IMPLANT
DRAIN CHANNEL 19F RND (DRAIN) ×2 IMPLANT
DRAPE LAPAROSCOPIC ABDOMINAL (DRAPES) IMPLANT
ELECT REM PT RETURN 15FT ADLT (MISCELLANEOUS) ×3 IMPLANT
EVACUATOR DRAINAGE 7X20 100CC (MISCELLANEOUS) IMPLANT
EVACUATOR SILICONE 100CC (MISCELLANEOUS) ×3
GLOVE BIO SURGEON STRL SZ 6.5 (GLOVE) ×2 IMPLANT
GLOVE BIO SURGEONS STRL SZ 6.5 (GLOVE) ×1
GLOVE BIOGEL PI IND STRL 7.0 (GLOVE) ×1 IMPLANT
GLOVE BIOGEL PI INDICATOR 7.0 (GLOVE) ×2
GOWN STRL REUS W/TWL 2XL LVL3 (GOWN DISPOSABLE) ×3 IMPLANT
GOWN STRL REUS W/TWL XL LVL3 (GOWN DISPOSABLE) ×3 IMPLANT
GRASPER SUT TROCAR 14GX15 (MISCELLANEOUS) IMPLANT
HANDLE STAPLE EGIA 4 XL (STAPLE) IMPLANT
IRRIG SUCT STRYKERFLOW 2 WTIP (MISCELLANEOUS) ×3
IRRIGATION SUCT STRKRFLW 2 WTP (MISCELLANEOUS) ×1 IMPLANT
KIT BASIN OR (CUSTOM PROCEDURE TRAY) ×3 IMPLANT
MARKER SKIN DUAL TIP RULER LAB (MISCELLANEOUS) IMPLANT
POUCH SPECIMEN RETRIEVAL 10MM (ENDOMECHANICALS) ×2 IMPLANT
RELOAD EGIA 45 MED/THCK PURPLE (STAPLE) IMPLANT
RELOAD EGIA 45 TAN VASC (STAPLE) IMPLANT
RELOAD EGIA 60 MED/THCK PURPLE (STAPLE) ×3 IMPLANT
RELOAD EGIA 60 TAN VASC (STAPLE) ×2 IMPLANT
RELOAD STAPLE 60 MED/THCK ART (STAPLE) IMPLANT
SCISSORS LAP 5X45 EPIX DISP (ENDOMECHANICALS) ×2 IMPLANT
SCISSORS METZENBAUM CVD 33 (INSTRUMENTS) ×2 IMPLANT
SLEEVE XCEL OPT CAN 5 100 (ENDOMECHANICALS) IMPLANT
SUT ETHILON 2 0 PS N (SUTURE) ×3 IMPLANT
SUT VIC AB 2-0 SH 27 (SUTURE)
SUT VIC AB 2-0 SH 27X BRD (SUTURE) IMPLANT
SUT VIC AB 4-0 PS2 27 (SUTURE) ×3 IMPLANT
SUT VICRYL 0 UR6 27IN ABS (SUTURE) IMPLANT
TOWEL OR 17X26 10 PK STRL BLUE (TOWEL DISPOSABLE) ×3 IMPLANT
TRAY FOLEY W/METER SILVER 16FR (SET/KITS/TRAYS/PACK) ×3 IMPLANT
TRAY LAPAROSCOPIC (CUSTOM PROCEDURE TRAY) ×3 IMPLANT
TROCAR 5M 150ML BLDLS (TROCAR) ×9 IMPLANT
TROCAR BLADELESS OPT 5 100 (ENDOMECHANICALS) IMPLANT
TROCAR DILATING TIP 12MM 150MM (ENDOMECHANICALS) ×3 IMPLANT
TROCAR XCEL BLUNT TIP 100MML (ENDOMECHANICALS) ×3 IMPLANT

## 2017-10-03 NOTE — ED Notes (Signed)
ED Provider at bedside to discuss diposition

## 2017-10-03 NOTE — H&P (Signed)
Terri Heath is an 62 y.o. female.   Chief Complaint: abd pain HPI: 61 year old female here with right lower quadrant pain.  Patient states that her symptoms began 2 days ago.  Her symptoms began as an aching, cramping right lower quadrant pain with associated nausea.  She has had loss of appetite.  The pain is worse with any kind of palpation or movement.  No alleviating factors.  She was seen by her doctor today and had a white count of 15,000 and was sent to ED for further evaluation.  She has had multiple C-sections but denies any other surgical history.    Past Medical History:  Diagnosis Date  . Allergy   . Bilateral shoulder pain   . Borderline hypertension   . Bronchitis   . Diabetes mellitus    Diagnosed in 2000  . Hyperlipidemia   . Obesity   . Sleep apnea     Past Surgical History:  Procedure Laterality Date  . BREATH TEK H PYLORI  09/18/2011   Procedure: BREATH TEK H PYLORI;  Surgeon: Shann Medal, MD;  Location: [facility];  Service: General;  Laterality: N/A;  . CESAREAN SECTION     x 3    Family History  Problem Relation Age of Onset  . Alcohol abuse Mother   . Arthritis Mother   . Diabetes Mother   . Sleep apnea Mother   . Alcohol abuse Father   . Heart disease Father   . Sleep apnea Sister    Social History:  reports that she quit smoking about 27 years ago. Her smoking use included cigarettes. She has a 15.00 pack-year smoking history. she has never used smokeless tobacco. She reports that she does not drink alcohol or use drugs.  Allergies:  Allergies  Allergen Reactions  . Exenatide     Flu-like symptoms  . Lisinopril Other (See Comments)    cramping  . Other Diarrhea    Kale=food  . Statins Other (See Comments)    Leg cramps     (Not in a hospital admission)  Results for orders placed or performed during the hospital encounter of 10/03/17 (from the past 48 hour(s))  CBC with Differential     Status: Abnormal   Collection Time:  10/03/17  8:05 PM  Result Value Ref Range   WBC 14.7 (H) 4.0 - 10.5 K/uL   RBC 5.62 (H) 3.87 - 5.11 MIL/uL   Hemoglobin 16.1 (H) 12.0 - 15.0 g/dL   HCT 46.7 (H) 36.0 - 46.0 %   MCV 83.1 78.0 - 100.0 fL   MCH 28.6 26.0 - 34.0 pg   MCHC 34.5 30.0 - 36.0 g/dL   RDW 13.5 11.5 - 15.5 %   Platelets 255 150 - 400 K/uL   Neutrophils Relative % 82 %   Neutro Abs 12.1 (H) 1.7 - 7.7 K/uL   Lymphocytes Relative 11 %   Lymphs Abs 1.6 0.7 - 4.0 K/uL   Monocytes Relative 7 %   Monocytes Absolute 1.0 0.1 - 1.0 K/uL   Eosinophils Relative 0 %   Eosinophils Absolute 0.0 0.0 - 0.7 K/uL   Basophils Relative 0 %   Basophils Absolute 0.0 0.0 - 0.1 K/uL    Comment: Performed at [facility], [place] [address] [place], [place] [address]  Comprehensive metabolic panel     Status: Abnormal   Collection Time: 10/03/17  8:05 PM  Result Value Ref Range   Sodium 132 (L) 135 - 145 mmol/L  Potassium 4.6 3.5 - 5.1 mmol/L   Chloride 95 (L) 101 - 111 mmol/L   CO2 23 22 - 32 mmol/L   Glucose, Bld 287 (H) 65 - 99 mg/dL   BUN 10 6 - 20 mg/dL   Creatinine, Ser 0.64 0.44 - 1.00 mg/dL   Calcium 8.9 8.9 - 10.3 mg/dL   Total Protein 8.2 (H) 6.5 - 8.1 g/dL   Albumin 3.7 3.5 - 5.0 g/dL   AST 19 15 - 41 U/L   ALT 25 14 - 54 U/L   Alkaline Phosphatase 65 38 - 126 U/L   Total Bilirubin 0.8 0.3 - 1.2 mg/dL   GFR calc non Af Amer >60 >60 mL/min   GFR calc Af Amer >60 >60 mL/min    Comment: (NOTE) The eGFR has been calculated using the CKD EPI equation. This calculation has not been validated in all clinical situations. eGFR's persistently <60 mL/min signify possible Chronic Kidney Disease.    Anion gap 14 5 - 15    Comment: Performed at [facility], [place] [address], [place], [place] [address]  Lipase, blood     Status: None   Collection Time: 10/03/17  8:05 PM  Result Value Ref Range   Lipase 23 11 - 51 U/L    Comment: Performed at [facility], [place]  [address] [place], [place] [address]   Ct Abdomen Pelvis W Contrast  Result Date: 10/03/2017 CLINICAL DATA:  Epigastric pain radiating to the right lower quadrant with rebound tenderness over the last 2 days. EXAM: CT ABDOMEN AND PELVIS WITH CONTRAST TECHNIQUE: Multidetector CT imaging of the abdomen and pelvis was performed using the standard protocol following bolus administration of intravenous contrast. CONTRAST:  143m ISOVUE-300 IOPAMIDOL (ISOVUE-300) INJECTION 61% COMPARISON:  11/13/2008 FINDINGS: Lower chest: Mild basilar atelectasis or scarring. Hepatobiliary: Diffuse fatty change of the liver, more severe in the right lobe than the left. Relative prominence of the left lobe and caudate likely to indicate early cirrhosis. No calcified gallstones. Pancreas: Normal Spleen: Normal Adrenals/Urinary Tract: Adrenal glands are normal. Kidneys are normal. No cyst, mass, stone or hydronephrosis. No bladder abnormality seen. Stomach/Bowel: No evidence of obstruction. Diverticulosis without evidence of diverticulitis. Appendix: Location: Typical right lower quadrant, turning lateral. Diameter: 16 mm Appendicolith: Multiple Mucosal hyper-enhancement: Present Extraluminal gas: None.  Surrounding inflammatory edema. Periappendiceal collection: No abscess present. Vascular/Lymphatic: Aortic atherosclerosis.  No aneurysm. Reproductive: Normal Other: No free fluid or air. Musculoskeletal: Old healed pelvic fractures. Chronic lumbar degenerative changes and sacroiliac degenerative changes. IMPRESSION: Definite acute appendicitis. Multiple appendicoliths. Surrounding edema without evidence of abscess or free air at this moment. Advanced fatty change of the liver.  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 Aortic atherosclerosis without aneurysm. Electronically Signed   By: MNelson ChimesM.D.   On: 10/03/2017 21:31    Review of Systems  Constitutional: Negative for chills and fever.  Eyes: Negative for blurred vision and double vision.   Respiratory: Negative for cough and shortness of breath.   Cardiovascular: Negative for chest pain and palpitations.  Gastrointestinal: Positive for abdominal pain and nausea. Negative for constipation, diarrhea and vomiting.  Genitourinary: Negative for dysuria, frequency and urgency.  Skin: Negative for itching and rash.    Blood pressure 127/79, pulse (!) 120, temperature 99 F (37.2 C), temperature source Oral, resp. rate 18, SpO2 94 %. Physical Exam  Constitutional: She is oriented to person, place, and time. She appears well-developed and well-nourished.  HENT:  Head: Normocephalic and atraumatic.  Eyes: Conjunctivae  and EOM are normal. Pupils are equal, round, and reactive to light.  Neck: Normal range of motion. Neck supple.  Cardiovascular: Normal rate and regular rhythm.  Respiratory: Effort normal and breath sounds normal. No respiratory distress.  GI: Soft. There is tenderness.  Musculoskeletal: Normal range of motion.  Neurological: She is alert and oriented to person, place, and time.  Skin: Skin is warm and dry.     Assessment/Plan 62 y.o. F with acute appendicitis.  I have recommended laparoscopic appendectomy.  Risks include bleeding, infection, damage to adjacent organs and hernia.  I believe she understands this and wishes to proceed with surgery.    Rosario Adie., MD 11/27/3242, 10:10 PM

## 2017-10-03 NOTE — ED Triage Notes (Signed)
Per EMS, patient coming from PCP, c/o epigastric pain radiating to RLQ with rebound tenderness x2 days. Endorses nausea, denies V/D. Patient reports WBC 15.   20g L hand 4mg  Zofran with EMS

## 2017-10-03 NOTE — ED Notes (Signed)
Pt was taken to the OR, but brought back for prepration

## 2017-10-03 NOTE — Progress Notes (Signed)
Chief Complaint  Patient presents with  . Abdominal Pain    fatigued, lots of pain that doesn't go away,  right side abd pain, no appetite.  Pain level 8/10    HPI   Pt is a diabetic who is on metformin She has been a brittle diabetic who was on numerous other meds.  She presents today with 2 days of abdominal pain that is mostly in "my pannus and is positional". "It is 8/10 until I move and then it is 10/10 and goes to my RLQ and back to my belly button".  She reports nausea but no vomiting She skipped her metformin the past 2 days due to not eating She has been drinking water No fevers or chills She drank a quart of water yesterday and today She denies flank pain She states that she has to move her pannus around but is only comfortable curled up on her side.  Lab Results  Component Value Date   HGBA1C 11.9 01/18/2017     Past Medical History:  Diagnosis Date  . Allergy   . Bilateral shoulder pain   . Borderline hypertension   . Bronchitis   . Diabetes mellitus    Diagnosed in 2000  . Hyperlipidemia   . Obesity   . Sleep apnea     Current Outpatient Medications  Medication Sig Dispense Refill  . albuterol (PROAIR HFA) 108 (90 Base) MCG/ACT inhaler Inhale 2 puffs into the lungs every 6 (six) hours as needed. wheezing 18 g 2  . bismuth subsalicylate (PEPTO BISMOL) 262 MG chewable tablet Chew 524 mg by mouth as needed. Reported on 09/21/2015    . cetirizine (ZYRTEC) 10 MG tablet Take 10 mg by mouth daily.    . diclofenac sodium (VOLTAREN) 1 % GEL Apply 2 g topically 4 (four) times daily. 100 g 1  . loperamide (IMODIUM) 2 MG capsule Take 1 capsule (2 mg total) by mouth 4 (four) times daily as needed for diarrhea or loose stools. 12 capsule 0  . magnesium oxide (MAG-OX) 400 MG tablet Take 400 mg by mouth 2 (two) times daily.    . metFORMIN (GLUCOPHAGE) 1000 MG tablet Take 1 tablet (1,000 mg total) by mouth 2 (two) times daily with a meal. 180 tablet 2  . Multiple Vitamin  (MULITIVITAMIN WITH MINERALS) TABS Take 1 tablet by mouth daily with breakfast.    . naproxen sodium (ANAPROX) 220 MG tablet Take 220 mg by mouth 2 (two) times daily as needed.    . ondansetron (ZOFRAN ODT) 4 MG disintegrating tablet Take 1 tablet (4 mg total) by mouth every 8 (eight) hours as needed for nausea or vomiting. 20 tablet 0  . UNABLE TO FIND Compression Stocking QD - 15-20 mmh     No current facility-administered medications for this visit.     Allergies:  Allergies  Allergen Reactions  . Exenatide     Flu-like symptoms  . Lisinopril Other (See Comments)    cramping  . Other Diarrhea    Kale=food  . Statins Other (See Comments)    Leg cramps    Past Surgical History:  Procedure Laterality Date  . BREATH TEK H PYLORI  09/18/2011   Procedure: BREATH TEK H PYLORI;  Surgeon: Shann Medal, MD;  Location: [facility];  Service: General;  Laterality: N/A;  . CESAREAN SECTION     x 3    Social History   Socioeconomic History  . Marital status: Married    Spouse name: None  .  Number of children: None  . Years of education: None  . Highest education level: None  Social Needs  . Financial resource strain: None  . Food insecurity - worry: None  . Food insecurity - inability: None  . Transportation needs - medical: None  . Transportation needs - non-medical: None  Occupational History  . None  Tobacco Use  . Smoking status: Former Smoker    Packs/day: 1.00    Years: 15.00    Pack years: 15.00    Types: Cigarettes    Last attempt to quit: 01/28/1990    Years since quitting: 27.6  . Smokeless tobacco: Never Used  Substance and Sexual Activity  . Alcohol use: No  . Drug use: No  . Sexual activity: Not Currently  Other Topics Concern  . None  Social History Narrative  . None    Family History  Problem Relation Age of Onset  . Alcohol abuse Mother   . Arthritis Mother   . Diabetes Mother   . Sleep apnea Mother   . Alcohol abuse Father   . Heart disease  Father   . Sleep apnea Sister      Review of Systems  Constitutional: Positive for malaise/fatigue. Negative for chills, diaphoresis, fever and weight loss.  HENT: Negative for congestion and sinus pain.   Eyes: Negative for blurred vision and double vision.  Respiratory: Negative for cough, shortness of breath and wheezing.   Cardiovascular: Negative for chest pain, palpitations and orthopnea.  Gastrointestinal: Positive for abdominal pain and nausea. Negative for blood in stool, constipation, diarrhea and vomiting.  Genitourinary: Negative for dysuria, frequency and urgency.  Musculoskeletal: Negative for myalgias and neck pain.  Skin: Negative for itching and rash.  Neurological: Positive for weakness. Negative for dizziness, tingling, tremors and headaches.  Psychiatric/Behavioral: Negative for depression. The patient is not nervous/anxious and does not have insomnia.       Objective: Vitals:   10/03/17 1515  BP: 131/85  Pulse: (!) 123  Resp: 16  Temp: 98.9 F (37.2 C)  SpO2: 99%  Weight: (!) 314 lb 3.2 oz (142.5 kg)  Height: 5\' 7"  (1.702 m)    Physical Exam  Constitutional: She is oriented to person, place, and time. She appears well-developed and well-nourished. She appears ill.  HENT:  Head: Normocephalic and atraumatic.  Eyes: Conjunctivae and EOM are normal.  Cardiovascular: Normal heart sounds and normal pulses. Tachycardia present. Exam reveals no friction rub.  No murmur heard. Pulmonary/Chest: Effort normal and breath sounds normal. No stridor. No respiratory distress. She has no wheezes.  Abdominal: She exhibits no ascites. Bowel sounds are decreased. There is tenderness in the right lower quadrant and epigastric area. There is tenderness at McBurney's point. There is no rigidity, no rebound, no guarding, no CVA tenderness and negative Murphy's sign.  mildline incisional scar  Neurological: She is alert and oriented to person, place, and time.  Skin: Skin is  warm. Capillary refill takes less than 2 seconds.  Psychiatric: She has a normal mood and affect. Her behavior is normal. Judgment and thought content normal.   Component     Latest Ref Rng & Units 10/03/2017 10/03/2017 10/03/2017         3:50 PM  3:50 PM  4:02 PM  WBC     4.6 - 10.2 K/uL   15.2 (A)  Lymph, poc     0.6 - 3.4   2.9  POC LYMPH PERCENT     10 - 50 %L  19.0  MID (cbc)     0 - 0.9   0.6  POC MID %     0 - 12 %M   4.1  POC Granulocyte     2 - 6.9   11.7 (A)  Granulocyte percent     37 - 80 %G   76.9  RBC     4.04 - 5.48 M/uL   6.00 (A)  Hemoglobin     12.2 - 16.2 g/dL   16.5 (A)  HCT     37.7 - 47.9 %   50.8 (A)  MCV     80 - 97 fL   84.6  MCH     27 - 31.2 pg   27.5  MCHC     31.8 - 35.4 g/dL   32.5  RDW, POC     %   14.2  Platelet Count, POC     142 - 424 K/uL   365  MPV     0 - 99.8 fL   6.8  Color, UA     yellow straw (A)    Clarity, UA     clear clear    Glucose     negative mg/dL =500 (A)    Bilirubin, UA     negative small (A) >= (160) (A)   Specific Gravity, UA     1.010 - 1.025 1.025    RBC, UA     negative negative    pH, UA     5.0 - 8.0 5.5    Protein, UA     negative mg/dL trace (A)    Urobilinogen, UA     0.2 or 1.0 E.U./dL 0.2    Nitrite, UA     Negative Negative    Leukocytes, UA     Negative Negative    WBC,UR,HPF,POC     None WBC/hpf     RBC,UR,HPF,POC     None RBC/hpf     Bacteria     None, Too numerous to count     Mucus     Absent     Epithelial Cells, UR Per Microscopy     None, Too numerous to count cells/hpf     POC Glucose     70 - 99 mg/dl      Component     Latest Ref Rng & Units 10/03/2017 10/03/2017         4:10 PM  4:13 PM  WBC     4.6 - 10.2 K/uL    Lymph, poc     0.6 - 3.4    POC LYMPH PERCENT     10 - 50 %L    MID (cbc)     0 - 0.9    POC MID %     0 - 12 %M    POC Granulocyte     2 - 6.9    Granulocyte percent     37 - 80 %G    RBC     4.04 - 5.48 M/uL    Hemoglobin     12.2 - 16.2 g/dL     HCT     37.7 - 47.9 %    MCV     80 - 97 fL    MCH     27 - 31.2 pg    MCHC     31.8 - 35.4 g/dL    RDW, POC     %    Platelet Count, POC     142 -  424 K/uL    MPV     0 - 99.8 fL    Color, UA     yellow    Clarity, UA     clear    Glucose     negative mg/dL    Bilirubin, UA     negative    Specific Gravity, UA     1.010 - 1.025    RBC, UA     negative    pH, UA     5.0 - 8.0    Protein, UA     negative mg/dL    Urobilinogen, UA     0.2 or 1.0 E.U./dL    Nitrite, UA     Negative    Leukocytes, UA     Negative    WBC,UR,HPF,POC     None WBC/hpf None   RBC,UR,HPF,POC     None RBC/hpf None   Bacteria     None, Too numerous to count None   Mucus     Absent Absent   Epithelial Cells, UR Per Microscopy     None, Too numerous to count cells/hpf None   POC Glucose     70 - 99 mg/dl  268 (A)   Assessment and Plan Marvene was seen today for abdominal pain.  Diagnoses and all orders for this visit:  Abdominal pain, RLQ -     POCT urinalysis dipstick -     POCT Microscopic Urinalysis ([facility]) -     POCT CBC -     Lipase -     Comprehensive metabolic panel  Abdominal pannus  Nausea without vomiting -     POCT urinalysis dipstick -     POCT Microscopic Urinalysis ([facility]) -     POCT CBC -     Lipase  Tachycardia  Other elevated white blood cell (WBC) count  Lethargy -     POCT glucose (manual entry)  SIRS (systemic inflammatory response syndrome) [facility])   Medical Decision Making Patient with SIRS as well as elevated glucosuria, lethargy and RLQ pain Concerning for abdominal pathology such as appendicitis She will be transferred by paramedics to the ER She has not been tolerating PO She also has a body habitus that impedes a quality abdominal exam so her area of severe tenderness is between mcburney's point and her umbilicus  She does not have altered mental status but already [place] to get here  And has SIRS thus she should be  transferred by paramedics to the ER to rule out or blood glucose derangement and appendicitis  Disposition: transferred to ER by paramedics  A total of 40 minutes were spent face-to-face with the patient during this encounter and over half of that time was spent on counseling and coordination of care.   Terri Heath

## 2017-10-03 NOTE — Patient Instructions (Signed)
     IF you received an x-ray today, you will receive an invoice from [facility]. Please contact [facility] at [phone] with questions or concerns regarding your invoice.   IF you received labwork today, you will receive an invoice from [facility]. Please contact [facility] at [phone] with questions or concerns regarding your invoice.   Our billing staff will not be able to assist you with questions regarding bills from these companies.  You will be contacted with the lab results as soon as they are available. The fastest way to get your results is to activate your My Chart account. Instructions are located on the last page of this paperwork. If you have not heard from us regarding the results in 2 weeks, please contact this office.     

## 2017-10-03 NOTE — Anesthesia Preprocedure Evaluation (Signed)
Anesthesia Evaluation  Patient identified by MRN, date of birth, ID band Patient awake    Reviewed: Allergy & Precautions, NPO status , Patient's Chart, lab work & pertinent test results  Airway Mallampati: III  TM Distance: >3 FB Neck ROM: Full    Dental no notable dental hx.    Pulmonary sleep apnea , former smoker,    Pulmonary exam normal breath sounds clear to auscultation       Cardiovascular hypertension, Normal cardiovascular exam Rhythm:Regular Rate:Normal     Neuro/Psych negative neurological ROS  negative psych ROS   GI/Hepatic negative GI ROS, Neg liver ROS,   Endo/Other  diabetes, Type 2Morbid obesity  Renal/GU negative Renal ROS  negative genitourinary   Musculoskeletal negative musculoskeletal ROS (+)   Abdominal   Peds negative pediatric ROS (+)  Hematology negative hematology ROS (+)   Anesthesia Other Findings   Reproductive/Obstetrics negative OB ROS                             Anesthesia Physical Anesthesia Plan  ASA: III and emergent  Anesthesia Plan: General   Post-op Pain Management:    Induction: Intravenous, Rapid sequence and Cricoid pressure planned  PONV Risk Score and Plan: 4 or greater and Ondansetron, Dexamethasone, Midazolam, Scopolamine patch - Pre-op and Treatment may vary due to age or medical condition  Airway Management Planned: Oral ETT  Additional Equipment:   Intra-op Plan:   Post-operative Plan: Extubation in OR  Informed Consent: I have reviewed the patients History and Physical, chart, labs and discussed the procedure including the risks, benefits and alternatives for the proposed anesthesia with the patient or authorized representative who has indicated his/her understanding and acceptance.   Dental advisory given  Plan Discussed with: CRNA  Anesthesia Plan Comments:         Anesthesia Quick Evaluation

## 2017-10-03 NOTE — ED Provider Notes (Signed)
[facility] DEPT Provider Note   CSN: [id number] Arrival date & time: 10/03/17  1718     History   Chief Complaint Chief Complaint  Patient presents with  . Abdominal Pain  . R/O Appendicitis    HPI Terri Heath is a 62 y.o. female.  HPI   62 year old female with history as below here with right lower quadrant pain.  Patient states that her symptoms began fairly acutely 2 days ago.  Her symptoms began as an aching, gnawing, cramping, severe right lower quadrant pain with associated nausea.  She has not been eating and drinking very much.  The pain is worse with any kind of palpation or movement.  No alleviating factors.  She was seen by her doctor today and had a white count of 15,000 and was sent here for further evaluation.  Denies any history of gallstones or appendix issues.  She has had multiple C-sections but denies history of hysterectomy.  No vaginal bleeding or discharge.  No urinary symptoms.  Past Medical History:  Diagnosis Date  . Allergy   . Bilateral shoulder pain   . Borderline hypertension   . Bronchitis   . Diabetes mellitus    Diagnosed in 2000  . Hyperlipidemia   . Obesity   . Sleep apnea     Patient Active Problem List   Diagnosis Date Noted  . Allergic rhinitis 06/11/2014  . Sleep apnea   . Asthma due to seasonal allergies 10/24/2012  . OSA (obstructive sleep apnea) 05/23/2011  . 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 Hyperlipemia 03/26/2007  . Morbid obesity, weight 368, BMI 57.7. 03/26/2007  . Essential hypertension 03/26/2007    Past Surgical History:  Procedure Laterality Date  . BREATH TEK H PYLORI  09/18/2011   Procedure: BREATH TEK H PYLORI;  Surgeon: Shann Medal, MD;  Location: [facility];  Service: General;  Laterality: N/A;  . CESAREAN SECTION     x 3    OB History    No data available       Home Medications    Prior to Admission medications   Medication Sig  Start Date End Date Taking? Authorizing Provider  albuterol (PROAIR HFA) 108 (90 Base) MCG/ACT inhaler Inhale 2 puffs into the lungs every 6 (six) hours as needed. wheezing 09/27/16  Yes Delia Chimes A, MD  aspirin 325 MG tablet Take 650 mg by mouth every 4 (four) hours as needed (PAIN).   Yes [provider]  bismuth subsalicylate (PEPTO BISMOL) 262 MG chewable tablet Chew 524 mg by mouth as needed. Reported on 09/21/2015   Yes [provider]  cetirizine (ZYRTEC) 10 MG tablet Take 10 mg by mouth daily.   Yes [provider]  metFORMIN (GLUCOPHAGE) 1000 MG tablet Take 1 tablet (1,000 mg total) by mouth 2 (two) times daily with a meal. 01/18/17  Yes Alveda Reasons, MD  Multiple Vitamin (MULITIVITAMIN WITH MINERALS) TABS Take 1 tablet by mouth daily with breakfast.   Yes [provider]  naproxen sodium (ANAPROX) 220 MG tablet Take 220 mg by mouth 2 (two) times daily as needed.   Yes [provider]  ondansetron (ZOFRAN ODT) 4 MG disintegrating tablet Take 1 tablet (4 mg total) by mouth every 8 (eight) hours as needed for nausea or vomiting. 09/30/16  Yes Barnet Glasgow, NP  UNABLE TO FIND Compression Stocking QD - 15-20 mmh   Yes [provider]  diclofenac sodium (VOLTAREN) 1 %  GEL Apply 2 g topically 4 (four) times daily. Patient not taking: Reported on 10/03/2017 05/07/17   [name], MD  loperamide (IMODIUM) 2 MG capsule Take 1 capsule (2 mg total) by mouth 4 (four) times daily as needed for diarrhea or loose stools. Patient not taking: Reported on 10/03/2017 09/30/16   Barnet Glasgow, NP    Family History Family History  Problem Relation Age of Onset  . Alcohol abuse Mother   . Arthritis Mother   . Diabetes Mother   . Sleep apnea Mother   . Alcohol abuse Father   . Heart disease Father   . Sleep apnea Sister     Social History Social History   Tobacco Use  . Smoking status: Former Smoker    Packs/day: 1.00    Years:  15.00    Pack years: 15.00    Types: Cigarettes    Last attempt to quit: 01/28/1990    Years since quitting: 27.6  . Smokeless tobacco: Never Used  Substance Use Topics  . Alcohol use: No  . Drug use: No     Allergies   Exenatide; Lisinopril; Other; and Statins   Review of Systems Review of Systems  Constitutional: Positive for fatigue.  Gastrointestinal: Positive for abdominal pain, diarrhea and nausea.  Neurological: Positive for weakness.  All other systems reviewed and are negative.    Physical Exam Updated Vital Signs BP 116/65 (BP Location: Right Arm)   Pulse (!) 124   Temp 99 F (37.2 C) (Oral)   Resp 20   SpO2 92%   Physical Exam  Constitutional: She is oriented to person, place, and time. She appears well-developed and well-nourished. No distress.  HENT:  Head: Normocephalic and atraumatic.  Moderately dry mucous membranes  Eyes: Conjunctivae are normal.  Neck: Neck supple.  Cardiovascular: Normal rate, regular rhythm and normal heart sounds. Exam reveals no friction rub.  No murmur heard. Pulmonary/Chest: Effort normal and breath sounds normal. No respiratory distress. She has no wheezes. She has no rales.  Abdominal: Soft. Normal appearance. She exhibits no distension. There is tenderness in the right lower quadrant. There is guarding.  Musculoskeletal: She exhibits no edema.  Neurological: She is alert and oriented to person, place, and time. She exhibits normal muscle tone.  Skin: Skin is warm. Capillary refill takes less than 2 seconds.  Psychiatric: She has a normal mood and affect.  Nursing note and vitals reviewed.    ED Treatments / Results  Labs (all labs ordered are listed, but only abnormal results are displayed) Labs Reviewed  CBC WITH DIFFERENTIAL/PLATELET - Abnormal; Notable for the following components:      Result Value   WBC 14.7 (*)    RBC 5.62 (*)    Hemoglobin 16.1 (*)    HCT 46.7 (*)    Neutro Abs 12.1 (*)    All other  components within normal limits  COMPREHENSIVE METABOLIC PANEL - Abnormal; Notable for the following components:   Sodium 132 (*)    Chloride 95 (*)    Glucose, Bld 287 (*)    Total Protein 8.2 (*)    All other components within normal limits  CULTURE, BLOOD (ROUTINE X 2)  CULTURE, BLOOD (ROUTINE X 2)  LIPASE, BLOOD    EKG  EKG Interpretation None       Radiology Ct Abdomen Pelvis W Contrast  Result Date: 10/03/2017 CLINICAL DATA:  Epigastric pain radiating to the right lower quadrant with rebound tenderness over the last 2 days.  EXAM: CT ABDOMEN AND PELVIS WITH CONTRAST TECHNIQUE: Multidetector CT imaging of the abdomen and pelvis was performed using the standard protocol following bolus administration of intravenous contrast. CONTRAST:  113mL ISOVUE-300 IOPAMIDOL (ISOVUE-300) INJECTION 61% COMPARISON:  11/13/2008 FINDINGS: Lower chest: Mild basilar atelectasis or scarring. Hepatobiliary: Diffuse fatty change of the liver, more severe in the right lobe than the left. Relative prominence of the left lobe and caudate likely to indicate early cirrhosis. No calcified gallstones. Pancreas: Normal Spleen: Normal Adrenals/Urinary Tract: Adrenal glands are normal. Kidneys are normal. No cyst, mass, stone or hydronephrosis. No bladder abnormality seen. Stomach/Bowel: No evidence of obstruction. Diverticulosis without evidence of diverticulitis. Appendix: Location: Typical right lower quadrant, turning lateral. Diameter: 16 mm Appendicolith: Multiple Mucosal hyper-enhancement: Present Extraluminal gas: None.  Surrounding inflammatory edema. Periappendiceal collection: No abscess present. Vascular/Lymphatic: Aortic atherosclerosis.  No aneurysm. Reproductive: Normal Other: No free fluid or air. Musculoskeletal: Old healed pelvic fractures. Chronic lumbar degenerative changes and sacroiliac degenerative changes. IMPRESSION: Definite acute appendicitis. Multiple appendicoliths. Surrounding edema without  evidence of abscess or free air at this moment. Advanced fatty change of the liver.  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 Aortic atherosclerosis without aneurysm. Electronically Signed   By: Nelson Chimes M.D.   On: 10/03/2017 21:31    Procedures Procedures (including critical care time)  Medications Ordered in ED Medications  sodium chloride 0.9 % injection (not administered)  sodium chloride 0.9 % bolus 1,000 mL (not administered)  piperacillin-tazobactam (ZOSYN) IVPB 3.375 g (not administered)  sodium chloride 0.9 % bolus 1,000 mL (0 mLs Intravenous Stopped 10/03/17 2142)  HYDROmorphone (DILAUDID) injection 1 mg (1 mg Intravenous Given 10/03/17 1927)  ondansetron (ZOFRAN) injection 4 mg (4 mg Intravenous Given 10/03/17 1927)  iopamidol (ISOVUE-300) 61 % injection (100 mLs  Contrast Given 10/03/17 2107)     Initial Impression / Assessment and Plan / ED Course  I have reviewed the triage vital signs and the nursing notes.  Pertinent labs & imaging results that were available during my care of the patient were reviewed by me and considered in my medical decision making (see chart for details).     86-year-old female with past medical history as above here with worsening epigastric than right lower quadrant pain.  Patient tachycardic but afebrile on arrival.  White count 14.7.  CT imaging confirms acute appendicitis.  She has no evidence of generalized peritonitis or obstruction or perforation.  Patient given IV antibiotics and will consult surgery for evaluation and admission.  Patient n.p.o.  Final Clinical Impressions(s) / ED Diagnoses   Final diagnoses:  Acute appendicitis, uncomplicated    ED Discharge Orders    None       Duffy Bruce, MD 10/03/17 2143

## 2017-10-03 NOTE — ED Triage Notes (Signed)
NPO since noon

## 2017-10-03 NOTE — Anesthesia Procedure Notes (Signed)
Procedure Name: Intubation Date/Time: 10/03/2017 11:15 PM Performed by: Lissa Morales, CRNA Pre-anesthesia Checklist: Patient identified, Emergency Drugs available, Suction available and Patient being monitored Patient Re-evaluated:Patient Re-evaluated prior to induction Oxygen Delivery Method: Circle system utilized Preoxygenation: Pre-oxygenation with 100% oxygen Induction Type: IV induction, Rapid sequence and Cricoid Pressure applied Laryngoscope Size: Glidescope and 4 Grade View: Grade II Tube type: Oral Number of attempts: 1 Airway Equipment and Method: Oral airway,  Video-laryngoscopy and Rigid stylet Placement Confirmation: ETT inserted through vocal cords under direct vision,  positive ETCO2 and breath sounds checked- equal and bilateral Secured at: 21 cm Tube secured with: Tape Dental Injury: Teeth and Oropharynx as per pre-operative assessment  Difficulty Due To: Difficulty was anticipated and Difficult Airway- due to large tongue Comments: Elective glidescope, Pt states had bad sore throat after GA in past.Blankets under shoulder s and head  On gel head rest. Good visualization with glidescope

## 2017-10-04 ENCOUNTER — Telehealth: Payer: Self-pay

## 2017-10-04 ENCOUNTER — Other Ambulatory Visit: Payer: Self-pay

## 2017-10-04 ENCOUNTER — Encounter (HOSPITAL_COMMUNITY): Payer: Self-pay | Admitting: General Surgery

## 2017-10-04 DIAGNOSIS — Z6841 Body Mass Index (BMI) 40.0 and over, adult: Secondary | ICD-10-CM | POA: Diagnosis not present

## 2017-10-04 DIAGNOSIS — Z791 Long term (current) use of non-steroidal anti-inflammatories (NSAID): Secondary | ICD-10-CM | POA: Diagnosis not present

## 2017-10-04 DIAGNOSIS — K3580 Unspecified acute appendicitis: Secondary | ICD-10-CM | POA: Diagnosis not present

## 2017-10-04 DIAGNOSIS — K3532 Acute appendicitis with perforation and localized peritonitis, without abscess: Secondary | ICD-10-CM

## 2017-10-04 DIAGNOSIS — E785 Hyperlipidemia, unspecified: Secondary | ICD-10-CM | POA: Diagnosis not present

## 2017-10-04 DIAGNOSIS — K35891 Other acute appendicitis without perforation, with gangrene: Secondary | ICD-10-CM | POA: Diagnosis not present

## 2017-10-04 DIAGNOSIS — Z79899 Other long term (current) drug therapy: Secondary | ICD-10-CM | POA: Diagnosis not present

## 2017-10-04 DIAGNOSIS — Z7984 Long term (current) use of oral hypoglycemic drugs: Secondary | ICD-10-CM | POA: Diagnosis not present

## 2017-10-04 DIAGNOSIS — G4733 Obstructive sleep apnea (adult) (pediatric): Secondary | ICD-10-CM | POA: Diagnosis not present

## 2017-10-04 DIAGNOSIS — E119 Type 2 diabetes mellitus without complications: Secondary | ICD-10-CM | POA: Diagnosis not present

## 2017-10-04 HISTORY — DX: Acute appendicitis with perforation, localized peritonitis, and gangrene, without abscess: K35.32

## 2017-10-04 LAB — COMPREHENSIVE METABOLIC PANEL
A/G RATIO: 1.1 — AB (ref 1.2–2.2)
ALT: 27 IU/L (ref 0–32)
AST: 17 IU/L (ref 0–40)
Albumin: 4.2 g/dL (ref 3.6–4.8)
Alkaline Phosphatase: 77 IU/L (ref 39–117)
BUN/Creatinine Ratio: 11 — ABNORMAL LOW (ref 12–28)
BUN: 8 mg/dL (ref 8–27)
Bilirubin Total: 0.5 mg/dL (ref 0.0–1.2)
CALCIUM: 9.7 mg/dL (ref 8.7–10.3)
CO2: 21 mmol/L (ref 20–29)
CREATININE: 0.72 mg/dL (ref 0.57–1.00)
Chloride: 90 mmol/L — ABNORMAL LOW (ref 96–106)
GFR calc Af Amer: 105 mL/min/{1.73_m2} (ref >59)
GFR, EST NON AFRICAN AMERICAN: 91 mL/min/{1.73_m2} (ref >59)
GLOBULIN, TOTAL: 4 g/dL (ref 1.5–4.5)
Glucose: 296 mg/dL — ABNORMAL HIGH (ref 65–99)
Potassium: 4.5 mmol/L (ref 3.5–5.2)
SODIUM: 134 mmol/L (ref 134–144)
Total Protein: 8.2 g/dL (ref 6.0–8.5)

## 2017-10-04 LAB — GLUCOSE, CAPILLARY
GLUCOSE-CAPILLARY: 266 mg/dL — AB (ref 65–99)
Glucose-Capillary: 286 mg/dL — ABNORMAL HIGH (ref 65–99)
Glucose-Capillary: 288 mg/dL — ABNORMAL HIGH (ref 65–99)
Glucose-Capillary: 302 mg/dL — ABNORMAL HIGH (ref 65–99)

## 2017-10-04 LAB — LIPASE: Lipase: 19 U/L (ref 14–72)

## 2017-10-04 MED ORDER — GABAPENTIN 300 MG PO CAPS: 300.0000 mg | ORAL_CAPSULE | Freq: Every day | ORAL | Status: DC

## 2017-10-04 MED ORDER — LACTATED RINGERS IV BOLUS (SEPSIS): 1000.0000 mL | Freq: Three times a day (TID) | INTRAVENOUS | Status: DC | PRN

## 2017-10-04 MED ORDER — ALUM & MAG HYDROXIDE-SIMETH 200-200-20 MG/5ML PO SUSP: 30.0000 mL | Freq: Four times a day (QID) | ORAL | Status: DC | PRN

## 2017-10-04 MED ORDER — HYDROCORTISONE 1 % EX CREA: 1.0000 "application " | TOPICAL_CREAM | Freq: Three times a day (TID) | CUTANEOUS | Status: DC | PRN

## 2017-10-04 MED ORDER — PSYLLIUM 95 % PO PACK
1.0000 | PACK | Freq: Two times a day (BID) | ORAL | Status: DC
  Filled 2017-10-04: qty 1

## 2017-10-04 MED ORDER — FENTANYL CITRATE (PF) 100 MCG/2ML IJ SOLN
INTRAMUSCULAR | Status: AC
  Filled 2017-10-04: qty 2

## 2017-10-04 MED ORDER — ENOXAPARIN SODIUM 40 MG/0.4ML ~~LOC~~ SOLN: 40.0000 mg | SUBCUTANEOUS | Status: DC

## 2017-10-04 MED ORDER — ADULT MULTIVITAMIN W/MINERALS CH: 1.0000 | ORAL_TABLET | Freq: Every day | ORAL | Status: DC

## 2017-10-04 MED ORDER — METOCLOPRAMIDE HCL 5 MG/ML IJ SOLN: 10.0000 mg | Freq: Four times a day (QID) | INTRAMUSCULAR | Status: DC | PRN

## 2017-10-04 MED ORDER — ASPIRIN 325 MG PO TABS: 650.0000 mg | ORAL_TABLET | ORAL | Status: DC | PRN

## 2017-10-04 MED ORDER — SODIUM CHLORIDE 0.9% FLUSH
3.0000 mL | Freq: Two times a day (BID) | INTRAVENOUS | Status: DC
  Administered 2017-10-04: 3 mL via INTRAVENOUS

## 2017-10-04 MED ORDER — SUGAMMADEX SODIUM 500 MG/5ML IV SOLN
INTRAVENOUS | Status: AC
  Filled 2017-10-04: qty 5

## 2017-10-04 MED ORDER — ROCURONIUM BROMIDE 100 MG/10ML IV SOLN
INTRAVENOUS | Status: DC | PRN
  Administered 2017-10-03: 50 mg via INTRAVENOUS

## 2017-10-04 MED ORDER — PIPERACILLIN-TAZOBACTAM 3.375 G IVPB
3.3750 g | Freq: Three times a day (TID) | INTRAVENOUS | Status: DC
  Administered 2017-10-04 (×2): 3.375 g via INTRAVENOUS
  Filled 2017-10-04 (×3): qty 50

## 2017-10-04 MED ORDER — PHENYLEPHRINE 40 MCG/ML (10ML) SYRINGE FOR IV PUSH (FOR BLOOD PRESSURE SUPPORT)
PREFILLED_SYRINGE | INTRAVENOUS | Status: AC
  Filled 2017-10-04: qty 10

## 2017-10-04 MED ORDER — OXYCODONE HCL 5 MG PO TABS: 5.0000 mg | ORAL_TABLET | Freq: Four times a day (QID) | ORAL | 0 refills | Status: DC | PRN

## 2017-10-04 MED ORDER — NAPROXEN 250 MG PO TABS
250.0000 mg | ORAL_TABLET | Freq: Two times a day (BID) | ORAL | Status: DC | PRN
  Filled 2017-10-04: qty 1

## 2017-10-04 MED ORDER — PROCHLORPERAZINE EDISYLATE 5 MG/ML IJ SOLN: 5.0000 mg | INTRAMUSCULAR | Status: DC | PRN

## 2017-10-04 MED ORDER — HYDROCODONE-ACETAMINOPHEN 5-325 MG PO TABS
1.0000 | ORAL_TABLET | ORAL | Status: DC | PRN
  Administered 2017-10-04: 2 via ORAL
  Filled 2017-10-04: qty 2

## 2017-10-04 MED ORDER — DIPHENHYDRAMINE HCL 50 MG/ML IJ SOLN: 12.5000 mg | Freq: Four times a day (QID) | INTRAMUSCULAR | Status: DC | PRN

## 2017-10-04 MED ORDER — HYDROMORPHONE HCL 1 MG/ML IJ SOLN
0.5000 mg | INTRAMUSCULAR | Status: DC | PRN
  Administered 2017-10-04: 12:00:00 1 mg via INTRAVENOUS
  Filled 2017-10-04: qty 1

## 2017-10-04 MED ORDER — ONDANSETRON HCL 4 MG/2ML IJ SOLN: 4.0000 mg | Freq: Four times a day (QID) | INTRAMUSCULAR | Status: DC | PRN

## 2017-10-04 MED ORDER — TRAMADOL HCL 50 MG PO TABS
50.0000 mg | ORAL_TABLET | Freq: Four times a day (QID) | ORAL | Status: DC | PRN
  Administered 2017-10-04: 100 mg via ORAL
  Filled 2017-10-04: qty 2

## 2017-10-04 MED ORDER — INSULIN ASPART 100 UNIT/ML ~~LOC~~ SOLN
0.0000 [IU] | Freq: Every day | SUBCUTANEOUS | Status: DC
  Administered 2017-10-04: 02:00:00 3 [IU] via SUBCUTANEOUS

## 2017-10-04 MED ORDER — PHENOL 1.4 % MT LIQD: 1.0000 | OROMUCOSAL | Status: DC | PRN

## 2017-10-04 MED ORDER — MENTHOL 3 MG MT LOZG: 1.0000 | LOZENGE | OROMUCOSAL | Status: DC | PRN

## 2017-10-04 MED ORDER — METFORMIN HCL 500 MG PO TABS
1000.0000 mg | ORAL_TABLET | Freq: Two times a day (BID) | ORAL | Status: DC
  Administered 2017-10-04: 08:00:00 500 mg via ORAL
  Filled 2017-10-04: qty 2

## 2017-10-04 MED ORDER — ACETAMINOPHEN 325 MG PO TABS: 650.0000 mg | ORAL_TABLET | Freq: Four times a day (QID) | ORAL | Status: DC | PRN

## 2017-10-04 MED ORDER — LORATADINE 10 MG PO TABS
10.0000 mg | ORAL_TABLET | Freq: Every day | ORAL | Status: DC
  Administered 2017-10-04: 08:00:00 10 mg via ORAL
  Filled 2017-10-04: qty 1

## 2017-10-04 MED ORDER — ACETAMINOPHEN 650 MG RE SUPP: 650.0000 mg | Freq: Four times a day (QID) | RECTAL | Status: DC | PRN

## 2017-10-04 MED ORDER — INSULIN ASPART 100 UNIT/ML ~~LOC~~ SOLN
0.0000 [IU] | Freq: Three times a day (TID) | SUBCUTANEOUS | Status: DC
  Administered 2017-10-04: 15 [IU] via SUBCUTANEOUS
  Administered 2017-10-04: 13:00:00 7 [IU] via SUBCUTANEOUS

## 2017-10-04 MED ORDER — ONDANSETRON 4 MG PO TBDP: 4.0000 mg | ORAL_TABLET | Freq: Four times a day (QID) | ORAL | Status: DC | PRN

## 2017-10-04 MED ORDER — SODIUM CHLORIDE 0.9% FLUSH: 3.0000 mL | INTRAVENOUS | Status: DC | PRN

## 2017-10-04 MED ORDER — BISACODYL 10 MG RE SUPP: 10.0000 mg | Freq: Every day | RECTAL | Status: DC | PRN

## 2017-10-04 MED ORDER — SENNOSIDES-DOCUSATE SODIUM 8.6-50 MG PO TABS: 1.0000 | ORAL_TABLET | Freq: Every day | ORAL | Status: DC

## 2017-10-04 MED ORDER — LABETALOL HCL 5 MG/ML IV SOLN
INTRAVENOUS | Status: AC
  Filled 2017-10-04: qty 4

## 2017-10-04 MED ORDER — HYDRALAZINE HCL 20 MG/ML IJ SOLN: 5.0000 mg | INTRAMUSCULAR | Status: DC | PRN

## 2017-10-04 MED ORDER — SIMETHICONE 80 MG PO CHEW: 40.0000 mg | CHEWABLE_TABLET | Freq: Four times a day (QID) | ORAL | Status: DC | PRN

## 2017-10-04 MED ORDER — HYDROCORTISONE 2.5 % RE CREA: 1.0000 "application " | TOPICAL_CREAM | Freq: Four times a day (QID) | RECTAL | Status: DC | PRN

## 2017-10-04 MED ORDER — GLUCERNA SHAKE PO LIQD
237.0000 mL | Freq: Two times a day (BID) | ORAL | Status: DC
  Administered 2017-10-04: 13:00:00 237 mL via ORAL
  Filled 2017-10-04 (×2): qty 237

## 2017-10-04 MED ORDER — SUGAMMADEX SODIUM 200 MG/2ML IV SOLN
INTRAVENOUS | Status: DC | PRN
  Administered 2017-10-04: 300 mg via INTRAVENOUS

## 2017-10-04 MED ORDER — MORPHINE SULFATE (PF) 2 MG/ML IV SOLN
2.0000 mg | INTRAVENOUS | Status: DC | PRN
  Administered 2017-10-04: 2 mg via INTRAVENOUS
  Filled 2017-10-04: qty 1

## 2017-10-04 MED ORDER — IBUPROFEN 400 MG PO TABS: 400.0000 mg | ORAL_TABLET | Freq: Four times a day (QID) | ORAL | Status: DC | PRN

## 2017-10-04 MED ORDER — DIPHENHYDRAMINE HCL 25 MG PO CAPS: 25.0000 mg | ORAL_CAPSULE | Freq: Four times a day (QID) | ORAL | Status: DC | PRN

## 2017-10-04 MED ORDER — BUPIVACAINE-EPINEPHRINE 0.5% -1:200000 IJ SOLN
INTRAMUSCULAR | Status: DC | PRN
  Administered 2017-10-04: 10 mL

## 2017-10-04 MED ORDER — MAGIC MOUTHWASH
15.0000 mL | Freq: Four times a day (QID) | ORAL | Status: DC | PRN
  Filled 2017-10-04: qty 15

## 2017-10-04 MED ORDER — ACETAMINOPHEN 500 MG PO TABS
1000.0000 mg | ORAL_TABLET | Freq: Three times a day (TID) | ORAL | Status: DC
  Administered 2017-10-04: 10:00:00 1000 mg via ORAL
  Filled 2017-10-04: qty 2

## 2017-10-04 MED ORDER — SODIUM CHLORIDE 0.9 % IV SOLN: 250.0000 mL | INTRAVENOUS | Status: DC | PRN

## 2017-10-04 MED ORDER — KCL IN DEXTROSE-NACL 20-5-0.45 MEQ/L-%-% IV SOLN
INTRAVENOUS | Status: DC
  Administered 2017-10-04: 02:00:00 via INTRAVENOUS
  Filled 2017-10-04: qty 1000

## 2017-10-04 MED ORDER — AMOXICILLIN-POT CLAVULANATE 875-125 MG PO TABS: 1.0000 | ORAL_TABLET | Freq: Two times a day (BID) | ORAL | 0 refills | Status: AC

## 2017-10-04 MED ORDER — ALBUTEROL SULFATE (2.5 MG/3ML) 0.083% IN NEBU
3.0000 mL | INHALATION_SOLUTION | Freq: Four times a day (QID) | RESPIRATORY_TRACT | Status: DC | PRN
  Administered 2017-10-04: 02:00:00 3 mL via RESPIRATORY_TRACT

## 2017-10-04 MED ORDER — GUAIFENESIN-DM 100-10 MG/5ML PO SYRP: 10.0000 mL | ORAL_SOLUTION | ORAL | Status: DC | PRN

## 2017-10-04 MED ORDER — LIP MEDEX EX OINT
1.0000 "application " | TOPICAL_OINTMENT | Freq: Two times a day (BID) | CUTANEOUS | Status: DC
  Administered 2017-10-04: 1 via TOPICAL

## 2017-10-04 NOTE — Progress Notes (Signed)
Inpatient Diabetes Program Recommendations  AACE/ADA: New Consensus Statement on Inpatient Glycemic Control (2015)  Target Ranges:  Prepandial:   less than 140 mg/dL      Peak postprandial:   less than 180 mg/dL (1-2 hours)      Critically ill patients:  140 - 180 mg/dL   Lab Results  Component Value Date   GLUCAP 302 (H) 10/04/2017   HGBA1C 11.9 01/18/2017   Review of Glycemic Control  Diabetes history: DM 2 Outpatient Diabetes medications: Metformin 1000 mg BID Current orders for Inpatient glycemic control: Metformin 1000 mg BID, Novolog Resistant Correction 0-20 units tid + Novolog HS scale  Inpatient Diabetes Program Recommendations:    Consider an A1c level to determine glucose control over the past 2-3 months. Glucose on admission in the 200's. Please consider low dose basal insulin while inpatient, Lantus 20 units Q24hours.  Thanks,  Tama Headings RN, MSN, [facility] Inpatient Diabetes Coordinator Team Pager [phone] (8a-5p)

## 2017-10-04 NOTE — Discharge Instructions (Signed)
Please arrive at least 30 min before your appointment to complete your check in paperwork.  If you are unable to arrive 30 min prior to your appointment time we may have to cancel or reschedule you. ° °LAPAROSCOPIC SURGERY: POST OP INSTRUCTIONS  °1. DIET: Follow a light bland diet the first 24 hours after arrival home, such as soup, liquids, crackers, etc. Be sure to include lots of fluids daily. Avoid fast food or heavy meals as your are more likely to get nauseated. Eat a low fat the next few days after surgery.  °2. Take your usually prescribed home medications unless otherwise directed. °3. PAIN CONTROL:  °1. Pain is best controlled by a usual combination of three different methods TOGETHER:  °1. Ice/Heat °2. Over the counter pain medication °3. Prescription pain medication °2. Most patients will experience some swelling and bruising around the incisions. Ice packs or heating pads (30-60 minutes up to 6 times a day) will help. Use ice for the first few days to help decrease swelling and bruising, then switch to heat to help relax tight/sore spots and speed recovery. Some people prefer to use ice alone, heat alone, alternating between ice & heat. Experiment to what works for you. Swelling and bruising can take several weeks to resolve.  °3. It is helpful to take an over-the-counter pain medication regularly for the first few weeks. Choose one of the following that works best for you:  °1. Naproxen (Aleve, etc) Two 220mg tabs twice a day °2. Ibuprofen (Advil, etc) Three 200mg tabs four times a day (every meal & bedtime) °3. Acetaminophen (Tylenol, etc) 500-650mg four times a day (every meal & bedtime) °4. A prescription for pain medication (such as oxycodone, hydrocodone, etc) should be given to you upon discharge. Take your pain medication as prescribed.  °1. If you are having problems/concerns with the prescription medicine (does not control pain, nausea, vomiting, rash, itching, etc), please call us [phone]  [phone] to see if we need to switch you to a different pain medicine that will work better for you and/or control your side effect better. °2. If you need a refill on your pain medication, please contact your pharmacy. They will contact our office to request authorization. Prescriptions will not be filled after 5 pm or on week-ends. °4. Avoid getting constipated. Between the surgery and the pain medications, it is common to experience some constipation. Increasing fluid intake and taking a fiber supplement (such as Metamucil, Citrucel, FiberCon, MiraLax, etc) 1-2 times a day regularly will usually help prevent this problem from occurring. A mild laxative (prune juice, Milk of Magnesia, MiraLax, etc) should be taken according to package directions if there are no bowel movements after 48 hours.  °5. Watch out for diarrhea. If you have many loose bowel movements, simplify your diet to bland foods & liquids for a few days. Stop any stool softeners and decrease your fiber supplement. Switching to mild anti-diarrheal medications (Kayopectate, Pepto Bismol) can help. If this worsens or does not improve, please call us. °6. Wash / shower every day. You may shower over the dressings as they are waterproof. Continue to shower over incision(s) after the dressing is off. °7. Remove your waterproof bandages 5 days after surgery. You may leave the incision open to air. You may replace a dressing/Band-Aid to cover the incision for comfort if you wish.  °8. ACTIVITIES as tolerated:  °1. You may resume regular (light) daily activities beginning the next day--such as daily self-care, walking, climbing stairs--gradually   increasing activities as tolerated. If you can walk 30 minutes without difficulty, it is safe to try more intense activity such as jogging, treadmill, bicycling, low-impact aerobics, swimming, etc. 2. Save the most intensive and strenuous activity for last such as sit-ups, heavy lifting, contact sports, etc Refrain  from any heavy lifting or straining until you are off narcotics for pain control.  3. DO NOT PUSH THROUGH PAIN. Let pain be your guide: If it hurts to do something, don't do it. Pain is your body warning you to avoid that activity for another week until the pain goes down. 4. You may drive when you are no longer taking prescription pain medication, you can comfortably wear a seatbelt, and you can safely maneuver your car and apply brakes. 5. You may have sexual intercourse when it is comfortable.  9. FOLLOW UP in our office  1. Please call [facility] at [phone] to set up an appointment to see your surgeon in the office for a follow-up appointment approximately 2-3 weeks after your surgery. 2. Make sure that you call for this appointment the day you arrive home to insure a convenient appointment time.      10. IF YOU HAVE DISABILITY OR FAMILY LEAVE FORMS, BRING THEM TO THE               OFFICE FOR PROCESSING.   WHEN TO CALL us [phone]:  1. Poor pain control 2. Reactions / problems with new medications (rash/itching, nausea, etc)  3. Fever over 101.5 F (38.5 C) 4. Inability to urinate 5. Nausea and/or vomiting 6. Worsening swelling or bruising 7. Continued bleeding from incision. 8. Increased pain, redness, or drainage from the incision  The clinic staff is available to answer your questions during regular business hours (8:30am-5pm). Please dont hesitate to call and ask to speak to one of our nurses for clinical concerns.  If you have a medical emergency, go to the nearest emergency room or call 911.  A surgeon from [facility] is always on call at the [facility], [place], [place], [place], [place] [address] ?  MAIN: [phone] ? TOLL FREE: [phone] ?  FAX [phone]  [email or web address]   [facility] Surgical drains are used to remove extra fluid that normally builds up in a surgical  wound after surgery. A surgical drain helps to heal a surgical wound. Different kinds of surgical drains include:  Active drains. These drains use suction to pull drainage away from the surgical wound. Drainage flows through a tube to a container outside of the body. It is important to keep the bulb or the drainage container flat (compressed) at all times, except while you empty it. Flattening the bulb or container creates suction. The two most common types of active drains are bulb drains and Hemovac drains.  Passive drains. These drains allow fluid to drain naturally, by gravity. Drainage flows through a tube to a bandage (dressing) or a container outside of the body. Passive drains do not need to be emptied. The most common type of passive drain is the Penrose drain.  A drain is placed during surgery. Immediately after surgery, drainage is usually bright red and a little thicker than water. The drainage may gradually turn yellow or pink and become thinner. It is likely that your health care provider will remove the drain when the drainage stops or when the amount decreases to 1-2 Tbsp (15-30 mL) during  a 24-hour period. How to care for your surgical drain  Keep the skin around the drain dry and covered with a dressing at all times.  Check your drain area every day for signs of infection. Check for: ? More redness, swelling, or pain. ? Pus or a bad smell. ? Cloudy drainage. Follow instructions from your health care provider about how to take care of your drain and how to change your dressing. Change your dressing at least one time every day. Change it more often if needed to keep the dressing dry. Make sure you: 1. Gather your supplies, including: ? Tape. ? Germ-free cleaning solution (sterile saline). ? Split gauze drain sponge: 4 x 4 inches (10 x 10 cm). ? Gauze square: 4 x 4 inches (10 x 10 cm). 2. Wash your hands with soap and water before you change your dressing. If soap and water are  not available, use hand sanitizer. 3. Remove the old dressing. Avoid using scissors to do that. 4. Use sterile saline to clean your skin around the drain. 5. Place the tube through the slit in a drain sponge. Place the drain sponge so that it covers your wound. 6. Place the gauze square or another drain sponge on top of the drain sponge that is on the wound. Make sure the tube is between those layers. 7. Tape the dressing to your skin. 8. If you have an active bulb or Hemovac drain, tape the drainage tube to your skin 1-2 inches (2.5-5 cm) below the place where the tube enters your body. Taping keeps the tube from pulling on any stitches (sutures) that you have. 9. Wash your hands with soap and water. 10. Write down the color of your drainage and how often you change your dressing.  How to empty your active bulb or Hemovac drain 1. Make sure that you have a measuring cup that you can empty your drainage into. 2. Wash your hands with soap and water. If soap and water are not available, use hand sanitizer. 3. Gently move your fingers down the tube while squeezing very lightly. This is called stripping the tube. This clears any drainage, clots, or tissue from the tube. ? Do not pull on the tube. ? You may need to strip the tube several times every day to keep the tube clear. 4. Open the bulb cap or the drain plug. Do not touch the inside of the cap or the bottom of the plug. 5. Empty all of the drainage into the measuring cup. 6. Compress the bulb or the container and replace the cap or the plug. To compress the bulb or the container, squeeze it firmly in the middle while you close the cap or plug the container. 7. Write down the amount of drainage that you have in each 24-hour period. If you have less than 2 Tbsp (30 mL) of drainage during 24 hours, contact your health care provider. 8. Flush the drainage down the toilet. 9. Wash your hands with soap and water. Contact a health care provider  if:  You have more redness, swelling, or pain around your drain area.  The amount of drainage that you have is increasing instead of decreasing.  You have pus or a bad smell coming from your drain area.  You have a fever.  You have drainage that is cloudy.  There is a sudden stop or a sudden decrease in the amount of drainage that you have.  Your tube falls out.  Your active draindoes  not stay compressedafter you empty it. This information is not intended to replace advice given to you by your health care provider. Make sure you discuss any questions you have with your health care provider. Document Released: 08/11/2000 Document Revised: 01/20/2016 Document Reviewed: 03/03/2015 Elsevier Interactive Patient Education  2018 [place] this sheet to all of your post-operative appointments while you have your drains.  Please measure your drains by CC's or ML's.  Make sure you drain and measure your JP Drains 2-3 times per day.  At the end of each day, add up totals            ( 9 am  )   ( 3 pm )   ( 9 pm )                Date    Total

## 2017-10-04 NOTE — Discharge Summary (Signed)
[facility] Discharge Summary   Patient ID: Terri Heath MRN: [id number] DOB/AGE: 1955/10/12 62 y.o.  Admit date: 10/03/2017 Discharge date: 10/04/2017  Admitting Diagnosis: Acute appendicitis  Discharge Diagnosis Patient Active Problem List   Diagnosis Date Noted  . Gangrenous appendicitis with perforation s/p lap appendectomy 10/04/2017 10/04/2017  . Allergic rhinitis 06/11/2014  . Asthma due to seasonal allergies 10/24/2012  . OSA (obstructive sleep apnea) 05/23/2011  . Diabetes mellitus type 2, insulin dependent ([place]) 03/26/2007  . Hyperlipemia 03/26/2007  . Morbid obesity, weight 368, BMI 57.7. 03/26/2007  . Essential hypertension 03/26/2007    Consultants None  Imaging: Ct Abdomen Pelvis W Contrast  Result Date: 10/03/2017 CLINICAL DATA:  Epigastric pain radiating to the right lower quadrant with rebound tenderness over the last 2 days. EXAM: CT ABDOMEN AND PELVIS WITH CONTRAST TECHNIQUE: Multidetector CT imaging of the abdomen and pelvis was performed using the standard protocol following bolus administration of intravenous contrast. CONTRAST:  122mL ISOVUE-300 IOPAMIDOL (ISOVUE-300) INJECTION 61% COMPARISON:  11/13/2008 FINDINGS: Lower chest: Mild basilar atelectasis or scarring. Hepatobiliary: Diffuse fatty change of the liver, more severe in the right lobe than the left. Relative prominence of the left lobe and caudate likely to indicate early cirrhosis. No calcified gallstones. Pancreas: Normal Spleen: Normal Adrenals/Urinary Tract: Adrenal glands are normal. Kidneys are normal. No cyst, mass, stone or hydronephrosis. No bladder abnormality seen. Stomach/Bowel: No evidence of obstruction. Diverticulosis without evidence of diverticulitis. Appendix: Location: Typical right lower quadrant, turning lateral. Diameter: 16 mm Appendicolith: Multiple Mucosal hyper-enhancement: Present Extraluminal gas: None.  Surrounding inflammatory edema. Periappendiceal collection: No  abscess present. Vascular/Lymphatic: Aortic atherosclerosis.  No aneurysm. Reproductive: Normal Other: No free fluid or air. Musculoskeletal: Old healed pelvic fractures. Chronic lumbar degenerative changes and sacroiliac degenerative changes. IMPRESSION: Definite acute appendicitis. Multiple appendicoliths. Surrounding edema without evidence of abscess or free air at this moment. Advanced fatty change of the liver.  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 Aortic atherosclerosis without aneurysm. Electronically Signed   By: Nelson Chimes M.D.   On: 10/03/2017 21:31    Procedures Dr. Marcello Moores (10/04/17) - Laparoscopic Appendectomy  Hospital Course:  Patient is a 62 y/o female who presented to [facility] with abdominal pain. Workup showed acute appendicitis.  Patient was admitted and underwent procedure listed above.  Tolerated procedure well and was transferred to the floor.  Diet was advanced as tolerated.  On POD#1, the patient was voiding well, tolerating diet, ambulating well, pain well controlled, vital signs stable, incisions c/d/i and felt stable for discharge home.  Patient will follow up in our office in 2 weeks and knows to call with questions or concerns. She will call to confirm appointment date/time.     Allergies as of 10/04/2017      Reactions   Exenatide    Flu-like symptoms   Lisinopril Other (See Comments)   cramping   Other Diarrhea   Kale=food   Statins Other (See Comments)   Leg cramps      Medication List    TAKE these medications   albuterol 108 (90 Base) MCG/ACT inhaler Commonly known as:  PROAIR HFA Inhale 2 puffs into the lungs every 6 (six) hours as needed. wheezing   amoxicillin-clavulanate 875-125 MG tablet Commonly known as:  AUGMENTIN Take 1 tablet by mouth every 12 (twelve) hours for 7 days.   aspirin 325 MG tablet Take 650 mg by mouth every 4 (four) hours as needed (PAIN).   bismuth subsalicylate 885 MG chewable tablet Commonly known as:  PEPTO  BISMOL Chew 524 mg by mouth  as needed. Reported on 09/21/2015   cetirizine 10 MG tablet Commonly known as:  ZYRTEC Take 10 mg by mouth daily.   diclofenac sodium 1 % Gel Commonly known as:  VOLTAREN Apply 2 g topically 4 (four) times daily.   loperamide 2 MG capsule Commonly known as:  IMODIUM Take 1 capsule (2 mg total) by mouth 4 (four) times daily as needed for diarrhea or loose stools.   metFORMIN 1000 MG tablet Commonly known as:  GLUCOPHAGE Take 1 tablet (1,000 mg total) by mouth 2 (two) times daily with a meal.   multivitamin with minerals Tabs tablet Take 1 tablet by mouth daily with breakfast.   naproxen sodium 220 MG tablet Commonly known as:  ALEVE Take 220 mg by mouth 2 (two) times daily as needed.   ondansetron 4 MG disintegrating tablet Commonly known as:  ZOFRAN ODT Take 1 tablet (4 mg total) by mouth every 8 (eight) hours as needed for nausea or vomiting.   oxyCODONE 5 MG immediate release tablet Commonly known as:  Oxy IR/ROXICODONE Take 1 tablet (5 mg total) by mouth every 6 (six) hours as needed for moderate pain or severe pain.   UNABLE TO FIND Compression Stocking QD - 15-20 mmh        Follow-up Information    [facility], [place]. Go on 10/18/2017.   Specialty:  General Surgery Why:  Your appointment is at 2:00 PM. Please arrive 30 min prior to appointment time. Bring photo ID and insurance information.  Contact information: [address] [place] [address] [phone]           Signed: Brigid Re, [facility] 10/04/2017, 3:47 PM Pager: [phone] Consults:[phone] Mon-Fri 7:00 am-4:30 pm Sat-Sun 7:00 am-11:30 am

## 2017-10-04 NOTE — Progress Notes (Signed)
RN reviewed discharge instructions with patient and family. All questions answered.   Paperwork and prescriptions given.   NT rolled patient down with all belongings to family car. 

## 2017-10-04 NOTE — Progress Notes (Signed)
Update of chart.   Patient diagnosed in the ER with appendicitis  CLINICAL DATA:  Epigastric pain radiating to the right lower quadrant with rebound tenderness over the last 2 days.  EXAM: CT ABDOMEN AND PELVIS WITH CONTRAST  TECHNIQUE: Multidetector CT imaging of the abdomen and pelvis was performed using the standard protocol following bolus administration of intravenous contrast.  CONTRAST:  169mL ISOVUE-300 IOPAMIDOL (ISOVUE-300) INJECTION 61%  COMPARISON:  11/13/2008  FINDINGS: Lower chest: Mild basilar atelectasis or scarring.  Hepatobiliary: Diffuse fatty change of the liver, more severe in the right lobe than the left. Relative prominence of the left lobe and caudate likely to indicate early cirrhosis. No calcified gallstones.  Pancreas: Normal  Spleen: Normal  Adrenals/Urinary Tract: Adrenal glands are normal. Kidneys are normal. No cyst, mass, stone or hydronephrosis. No bladder abnormality seen.  Stomach/Bowel: No evidence of obstruction. Diverticulosis without evidence of diverticulitis.  Appendix: Location: Typical right lower quadrant, turning lateral.  Diameter: 16 mm  Appendicolith: Multiple  Mucosal hyper-enhancement: Present  Extraluminal gas: None.  Surrounding inflammatory edema.  Periappendiceal collection: No abscess present.  Vascular/Lymphatic: Aortic atherosclerosis.  No aneurysm.  Reproductive: Normal  Other: No free fluid or air.  Musculoskeletal: Old healed pelvic fractures. Chronic lumbar degenerative changes and sacroiliac degenerative changes.  IMPRESSION: Definite acute appendicitis. Multiple appendicoliths. Surrounding edema without evidence of abscess or free air at this moment.  Advanced fatty change of the liver.  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  Aortic atherosclerosis without aneurysm.   Electronically Signed   By: Nelson Chimes M.D.   On: 10/03/2017 21:31

## 2017-10-04 NOTE — Progress Notes (Signed)
1 Day Post-Op   Subjective/Chief Complaint: Some soreness this AM    Objective: Vital signs in last 24 hours: Temp:  [97.9 F (36.6 C)-99.7 F (37.6 C)] 97.9 F (36.6 C) (02/07 0641) Pulse Rate:  [113-124] 115 (02/07 0641) Resp:  [16-34] 20 (02/07 0641) BP: (105-154)/(49-91) 137/77 (02/07 0641) SpO2:  [91 %-100 %] 95 % (02/07 0641) Weight:  [142.4 kg (314 lb)-142.5 kg (314 lb 3.2 oz)] 142.4 kg (314 lb) (02/07 0115) Last BM Date: 10/03/17  Intake/Output from previous day: 02/06 0701 - 02/07 0700 In: 0938 [P.O.:240; I.V.:1500; IV Piggyback:50] Out: 520 [Urine:475; Drains:40; Blood:5] Intake/Output this shift: Total I/O In: 290 [P.O.:240; IV Piggyback:50] Out: -   General appearance: alert and cooperative GI: soft, approp ttp, NS, inc c/d/i  Lab Results:  Recent Labs    10/03/17 1602 10/03/17 2005  WBC 15.2* 14.7*  HGB 16.5* 16.1*  HCT 50.8* 46.7*  PLT  --  255   BMET Recent Labs    10/03/17 1552 10/03/17 2005  NA 134 132*  K 4.5 4.6  CL 90* 95*  CO2 21 23  GLUCOSE 296* 287*  BUN 8 10  CREATININE 0.72 0.64  CALCIUM 9.7 8.9   PT/INR No results for input(s): LABPROT, INR in the last 72 hours. ABG No results for input(s): PHART, HCO3 in the last 72 hours.  Invalid input(s): PCO2, PO2  Studies/Results: Ct Abdomen Pelvis W Contrast  Result Date: 10/03/2017 CLINICAL DATA:  Epigastric pain radiating to the right lower quadrant with rebound tenderness over the last 2 days. EXAM: CT ABDOMEN AND PELVIS WITH CONTRAST TECHNIQUE: Multidetector CT imaging of the abdomen and pelvis was performed using the standard protocol following bolus administration of intravenous contrast. CONTRAST:  189mL ISOVUE-300 IOPAMIDOL (ISOVUE-300) INJECTION 61% COMPARISON:  11/13/2008 FINDINGS: Lower chest: Mild basilar atelectasis or scarring. Hepatobiliary: Diffuse fatty change of the liver, more severe in the right lobe than the left. Relative prominence of the left lobe and caudate  likely to indicate early cirrhosis. No calcified gallstones. Pancreas: Normal Spleen: Normal Adrenals/Urinary Tract: Adrenal glands are normal. Kidneys are normal. No cyst, mass, stone or hydronephrosis. No bladder abnormality seen. Stomach/Bowel: No evidence of obstruction. Diverticulosis without evidence of diverticulitis. Appendix: Location: Typical right lower quadrant, turning lateral. Diameter: 16 mm Appendicolith: Multiple Mucosal hyper-enhancement: Present Extraluminal gas: None.  Surrounding inflammatory edema. Periappendiceal collection: No abscess present. Vascular/Lymphatic: Aortic atherosclerosis.  No aneurysm. Reproductive: Normal Other: No free fluid or air. Musculoskeletal: Old healed pelvic fractures. Chronic lumbar degenerative changes and sacroiliac degenerative changes. IMPRESSION: Definite acute appendicitis. Multiple appendicoliths. Surrounding edema without evidence of abscess or free air at this moment. Advanced fatty change of the liver.  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 Aortic atherosclerosis without aneurysm. Electronically Signed   By: Nelson Chimes M.D.   On: 10/03/2017 21:31    Anti-infectives: Anti-infectives (From admission, onward)   Start     Dose/Rate Route Frequency Ordered Stop   10/04/17 0400  piperacillin-tazobactam (ZOSYN) IVPB 3.375 g     3.375 g 12.5 mL/hr over 240 Minutes Intravenous Every 8 hours 10/04/17 0131     10/03/17 2145  piperacillin-tazobactam (ZOSYN) IVPB 3.375 g     3.375 g 100 mL/hr over 30 Minutes Intravenous  Once 10/03/17 2131 10/03/17 2247      Assessment/Plan: s/p Procedure(s): APPENDECTOMY LAPAROSCOPIC (N/A) Advance diet as tol Mobilize Abx Pain control Plan for home tomorrow if doing well   LOS: 0 days    Rosario Jacks., Anne Hahn 10/04/2017

## 2017-10-04 NOTE — Transfer of Care (Signed)
Immediate Anesthesia Transfer of Care Note  Patient: Terri Heath  Procedure(s) Performed: APPENDECTOMY LAPAROSCOPIC (N/A Abdomen)  Patient Location: PACU  Anesthesia Type:General  Level of Consciousness: awake, alert , oriented and patient cooperative  Airway & Oxygen Therapy: Patient Spontanous Breathing and Patient connected to face mask oxygen  Post-op Assessment: Report given to RN, Post -op Vital signs reviewed and stable and Patient moving all extremities X 4  Post vital signs: stable  Last Vitals:  Vitals:   10/03/17 2209 10/04/17 0032  BP: 127/79 130/73  Pulse: (!) 120 (!) 113  Resp: 18 (!) 28  Temp:  37.6 C  SpO2: 94% 98%    Last Pain:  Vitals:   10/03/17 2209  TempSrc:   PainSc: 0-No pain         Complications: No apparent anesthesia complications

## 2017-10-04 NOTE — Op Note (Signed)
Terri Heath [id number]   PRE-OPERATIVE DIAGNOSIS:  acute appendicitis  POST-OPERATIVE DIAGNOSIS:  APPENDICITIS WITH NECROSIS   Procedure(s): APPENDECTOMY LAPAROSCOPIC   Surgeon(s): Leighton Ruff, MD  ASSISTANT: none   ANESTHESIA:   local and general  EBL:   79ml  Delay start of Pharmacological VTE agent (>24hrs) due to surgical blood loss or risk of bleeding:  no  DRAINS: (25F) Jackson-Pratt drain(s) with closed bulb suction in the RLQ   SPECIMEN:  Source of Specimen:  appendix  DISPOSITION OF SPECIMEN:  PATHOLOGY  COUNTS:  YES  PLAN OF CARE: Admit for overnight observation  PATIENT DISPOSITION:  PACU - hemodynamically stable.   INDICATIONS: Patient with concerning symptoms & work up suspicious for appendicitis.  Surgery was recommended:  The anatomy & physiology of the digestive tract was discussed.  The pathophysiology of appendicitis was discussed.  Natural history risks without surgery was discussed.   I feel the risks of no intervention will lead to serious problems that outweigh the operative risks; therefore, I recommended diagnostic laparoscopy with removal of appendix to remove the pathology.  Laparoscopic & open techniques were discussed.   I noted a good likelihood this will help address the problem.    Risks such as bleeding, infection, abscess, leak, reoperation, possible ostomy, hernia, heart attack, death, and other risks were discussed.  Goals of post-operative recovery were discussed as well.  We will work to minimize complications.  Questions were answered.  The patient expresses understanding & wishes to proceed with surgery.  OR FINDINGS: necrotic appendicitis   DESCRIPTION:   The patient was identified & brought into the operating room. The patient was positioned supine with left arm tucked. SCDs were active during the entire case. The patient underwent general anesthesia without any difficulty.  A foley catheter was inserted under sterile  conditions. The abdomen was prepped and draped in a sterile fashion. A Surgical Timeout confirmed our plan.  I began by placing the patient in reverse Trendelenburg with left side up.  A varies needle was introduced into the left upper quadrant.  Insufflation was induced after we confirm placement.  I then placed a 12 mm port in the upper midline after the abdomen was insufflated to approximately 15 mmHg.  Camera inspection revealed no injury.  I placed additional ports under direct laparoscopic visualization.    I mobilized the terminal ileum to proximal ascending colon in a lateral to medial fashion.  I took care to avoid injuring any retroperitoneal structures.   I freed the appendix off its attachments to the ascending colon and cecal mesentery.  The appendiceal base was necrotic and filled with purulent debris.  I elevated the appendix.  I was able to free off the base of the appendix, which was still viable.  I stapled the appendix off the cecum using a laparoscopic purple load stapler.  I took a healthy cuff viable cecum. I skeletonized & ligated the mesoappendix with a white load stapler.   I placed the appendix inside an EndoCatch bag and removed out the [place] port.  I did copious irrigation. Hemostasis was good in the mesoappendix, colon mesentery, and retroperitoneum. Staple line was intact on the cecum with no bleeding. I washed out the pelvis, retrohepatic space and right paracolic gutter.  Hemostasis is good. There was no perforation or injury.  Because the appendix was perforated I decided to leave a 68 [place] drain.  This was placed in the right paracolic gutter and brought out through the left lower quadrant  port site.  It was secured into place with a 2-0 nylon suture.  I then remove the 12 port and closed this incision laparoscopically using a laparoscopic suture passer and 0 Vicryl suture.  I aspirated the carbon dioxide. I removed the ports. I closed the subcutaneous tissue using a 0  Vicryl stitch. I closed skin using 4-0 vicryl stitch.  Sterile dressings were applied.  Patient was extubated and sent to the recovery room.  I discussed the operative findings with the patient's family. I suspect the patient is going used in the hospital at least overnight and will need antibiotics for 5 days. Questions answered. They expressed understanding and appreciation.

## 2017-10-05 NOTE — Telephone Encounter (Signed)
Transition Care Management Follow-up Telephone Call   Date discharged? 10/04/17   How have you been since you were released from the hospital? Patient states that she is doing okay. Slight sore throat noted.    Do you understand why you were in the hospital? yes   Do you understand the discharge instructions? yes   Where were you discharged to? home   Items Reviewed:  Medications reviewed: yes  Allergies reviewed: yes  Dietary changes reviewed: yes  Referrals reviewed: yes   Functional Questionnaire:   Activities of Daily Living (ADLs):   She states they are independent in the following: ambulation, bathing and hygiene, feeding, continence, grooming, toileting and dressing States they require assistance with the following: none   Any transportation issues/concerns?: no   Any patient concerns? no   Confirmed importance and date/time of follow-up visits scheduled yes  Provider Appointment booked with Dr. Nolon Rod on 10/08/2017 @ 1:20 pm.    Confirmed with patient if condition begins to worsen call PCP or go to the ER.  Patient was given the office number and encouraged to call back with question or concerns.  : yes

## 2017-10-05 NOTE — Anesthesia Postprocedure Evaluation (Signed)
Anesthesia Post Note  Patient: Terri Heath  Procedure(s) Performed: APPENDECTOMY LAPAROSCOPIC (N/A Abdomen)     Patient location during evaluation: PACU Anesthesia Type: General Level of consciousness: awake and alert Pain management: pain level controlled Vital Signs Assessment: post-procedure vital signs reviewed and stable Respiratory status: spontaneous breathing, nonlabored ventilation, respiratory function stable and patient connected to nasal cannula oxygen Cardiovascular status: blood pressure returned to baseline and stable Postop Assessment: no apparent nausea or vomiting Anesthetic complications: no    Last Vitals:  Vitals:   10/04/17 0641 10/04/17 1400  BP: 137/77 (!) 93/59  Pulse: (!) 115 (!) 115  Resp: 20 16  Temp: 36.6 C 36.6 C  SpO2: 95%     Last Pain:  Vitals:   10/04/17 1400  TempSrc: Oral  PainSc:                  Montez Hageman

## 2017-10-07 NOTE — Progress Notes (Addendum)
TRANSITION OF CARE VISIT   Primary Care Physician (PCP): [name], MD                                                    [address] [place] [address]    Date of Admission: 10/03/17  Date of Discharge: 10/04/17  Discharged from: [facility]  Discharge Diagnosis: Acute Laparoscopic Appendicitis  Summary of Admission: pt was sent from the clinic to ER for concerns about appendicitis She was taken to the OR on 10/03/17 for appendicitis She was discharged on POD1  TODAY's VISIT  Patient/Caregiver self-reported problems/concerns:  She is tolerating eggs She is on her diabetes meds but does not eat much She has BM and is passing gas Her jp drain come out and when she was emptying the contents She reports that the output was getting less and less  Diabetes Mellitus: Patient also has uncontrolled diabetes.  While in the hospital her sugars were high but her blood cultures were negative.  She is still on her metformin. She denies evidence of hypoglycemia. She has no diarrhea, nausea or vomiting.  She sees Endocrinology. Lab Results  Component Value Date   HGBA1C 11.9 01/18/2017    Morbid obesity She reports that she has cut back on her intake in the past week leading up to her hospitalization because she thought she had a stomach infection. She was able to lose some weight but that is largely because of her poor PO intake. Wt Readings from Last 3 Encounters:  10/08/17 291 lb 12.8 oz (132.4 kg)  10/04/17 (!) 314 lb (142.4 kg)  10/03/17 (!) 314 lb 3.2 oz (142.5 kg)    Review of Systems  Constitutional: Positive for weight loss. Negative for chills, diaphoresis, fever and malaise/fatigue.  Respiratory: Negative for cough, sputum production and shortness of breath.   Cardiovascular: Negative for chest pain and palpitations.  Genitourinary: Negative for dysuria and urgency.  Skin: Negative for itching and  rash.  Neurological: Negative for dizziness and headaches.     Past Medical History:  Diagnosis Date  . Allergy   . Bilateral shoulder pain   . Borderline hypertension   . Bronchitis   . Diabetes mellitus    Diagnosed in 2000  . Hyperlipidemia   . Obesity   . Sleep apnea    Allergies  Allergen Reactions  . Exenatide     Flu-like symptoms  . Lisinopril Other (See Comments)    cramping  . Other Diarrhea    Kale=food  . Statins Other (See Comments)    Leg cramps   Past Surgical History:  Procedure Laterality Date  . BREATH TEK H PYLORI  09/18/2011   Procedure: BREATH TEK H PYLORI;  Surgeon: Shann Medal, MD;  Location: [facility];  Service: General;  Laterality: N/A;  . CESAREAN SECTION     x 3  . LAPAROSCOPIC APPENDECTOMY N/A 10/03/2017   Procedure: APPENDECTOMY LAPAROSCOPIC;  Surgeon: Leighton Ruff, MD;  Location: [facility];  Service: General;  Laterality:  N/A;    MEDICATIONS Current Outpatient Medications  Medication Sig Dispense Refill  . albuterol (PROAIR HFA) 108 (90 Base) MCG/ACT inhaler Inhale 2 puffs into the lungs every 6 (six) hours as needed. wheezing 18 g 2  . amoxicillin-clavulanate (AUGMENTIN) 875-125 MG tablet Take 1 tablet by mouth every 12 (twelve) hours for 7 days. 14 tablet 0  . aspirin 325 MG tablet Take 650 mg by mouth every 4 (four) hours as needed (PAIN).    Marland Kitchen bismuth subsalicylate (PEPTO BISMOL) 262 MG chewable tablet Chew 524 mg by mouth as needed. Reported on 09/21/2015    . cetirizine (ZYRTEC) 10 MG tablet Take 10 mg by mouth daily.    . metFORMIN (GLUCOPHAGE) 1000 MG tablet Take 1 tablet (1,000 mg total) by mouth 2 (two) times daily with a meal. 180 tablet 2  . Multiple Vitamin (MULITIVITAMIN WITH MINERALS) TABS Take 1 tablet by mouth daily with breakfast.    . naproxen sodium (ANAPROX) 220 MG tablet Take 220 mg by mouth 2 (two) times daily as needed.    . ondansetron (ZOFRAN ODT) 4 MG disintegrating tablet Take 1 tablet (4 mg total) by mouth  every 8 (eight) hours as needed for nausea or vomiting. 20 tablet 0  . oxyCODONE (OXY IR/ROXICODONE) 5 MG immediate release tablet Take 1 tablet (5 mg total) by mouth every 6 (six) hours as needed for moderate pain or severe pain. 15 tablet 0  . UNABLE TO FIND Compression Stocking QD - 15-20 mmh    . diclofenac sodium (VOLTAREN) 1 % GEL Apply 2 g topically 4 (four) times daily. (Patient not taking: Reported on 10/03/2017) 100 g 1  . loperamide (IMODIUM) 2 MG capsule Take 1 capsule (2 mg total) by mouth 4 (four) times daily as needed for diarrhea or loose stools. (Patient not taking: Reported on 10/03/2017) 12 capsule 0   No current facility-administered medications for this visit.     Medication Reconciliation conducted with patient/caregiver? (Yes/ No): Yes  New medications prescribed/discontinued upon discharge? (Yes/No): Yes  Barriers identified related to medications: None  LABS  Lab Reviewed (Yes/No/NA):     Yes Lab Results  Component Value Date   WBC 14.7 (H) 10/03/2017   HGB 16.1 (H) 10/03/2017   HCT 46.7 (H) 10/03/2017   MCV 83.1 10/03/2017   PLT 255 10/03/2017           PHYSICAL EXAM:  Vitals:   10/08/17 1324  BP: 126/82  Pulse: (!) 105  Resp: 16  Temp: 97.6 F (36.4 C)  TempSrc: Oral  SpO2: 98%  Weight: 291 lb 12.8 oz (132.4 kg)  Height: 5\' 7"  (1.702 m)    Physical Exam  Constitutional: She is oriented to person, place, and time. She appears well-developed and well-nourished.  HENT:  Head: Normocephalic and atraumatic.  Eyes: Conjunctivae and EOM are normal.  Cardiovascular: Normal rate, regular rhythm and normal heart sounds.   Pulmonary/Chest: Effort normal and breath sounds normal. No respiratory distress. She has no wheezes.  Abdominal: obese, distended abdomen, with large pannus 3 port wounds that are healing well JP drain in Left abd with patent site but jp drained removed  JP brought in with dark bloody content Abdomen soft, nontender, with  normoactive bs No rebound, no guarding Neurological: She is alert and oriented to person, place, and time.    ASSESSMENT:  Patsie was seen today for transitions of care.  Diagnoses and all orders for this visit:  Other acute appendicitis -   Recovering  well -   JP drained removed accidentally  -   Pt to notify surgery to make them aware  Morbid obesity with BMI of 45.0-49.9, adult ([place]) -   Large pannus makes her mobility after surgery a challenge   Uncontrolled type 2 diabetes mellitus with hyperglycemia, without long-term current use of insulin ([place]) -  Advised pt to monitor her sugars Hypoglycemia in her case would require evaluation  Screening for colon cancer:  Discussed risks and benefits of colonoscopy Given her recent surgery would recommend follow up with GI in 2-3 months after appropriate amount of healing Appendix pathology was benign  Other orders -     Ambulatory referral to Gastroenterology -     POCT glycosylated hemoglobin (Hb A1C) -     Microalbumin, urine -     Tdap vaccine greater than or equal to 7yo IM   PATIENT EDUCATION PROVIDED: See AVS   FOLLOW-UP (Include any further testing or referrals): has follow up for 10/18/17 with [name]  Complexity of Medical Decision Making:  moderate  A total of 30 minutes were spent face-to-face with the patient during this encounter and over half of that time was spent on counseling and coordination of care.  Followed up on patient diabetes and discussed glucose metabolism and infection. Discussed wound care. Advised follow up with General Surgery.

## 2017-10-08 ENCOUNTER — Encounter: Payer: Self-pay | Admitting: Family Medicine

## 2017-10-08 ENCOUNTER — Other Ambulatory Visit: Payer: Self-pay

## 2017-10-08 ENCOUNTER — Telehealth: Payer: Self-pay | Admitting: Family Medicine

## 2017-10-08 ENCOUNTER — Ambulatory Visit (INDEPENDENT_AMBULATORY_CARE_PROVIDER_SITE_OTHER): Payer: 59 | Admitting: Family Medicine

## 2017-10-08 VITALS — BP 126/82 | HR 105 | Temp 97.6°F | Resp 16 | Ht 67.0 in | Wt 291.8 lb

## 2017-10-08 DIAGNOSIS — E1165 Type 2 diabetes mellitus with hyperglycemia: Secondary | ICD-10-CM

## 2017-10-08 DIAGNOSIS — Z1211 Encounter for screening for malignant neoplasm of colon: Secondary | ICD-10-CM

## 2017-10-08 DIAGNOSIS — Z6841 Body Mass Index (BMI) 40.0 and over, adult: Secondary | ICD-10-CM | POA: Diagnosis not present

## 2017-10-08 DIAGNOSIS — K3589 Other acute appendicitis without perforation or gangrene: Secondary | ICD-10-CM | POA: Diagnosis not present

## 2017-10-08 LAB — CULTURE, BLOOD (ROUTINE X 2)
CULTURE: NO GROWTH
CULTURE: NO GROWTH
SPECIAL REQUESTS: ADEQUATE
Special Requests: ADEQUATE

## 2017-10-08 NOTE — Telephone Encounter (Signed)
Patient needs FMLA forms completed due to her last OV for her appendicitis. I have completed the forms based off the OV notes, I just need it to be signed. I will place the forms in Dr Nolon Rod box on 10/08/17 please return to the FMLA/Disability desk within 5-7 business days. Thank you!

## 2017-10-08 NOTE — Patient Instructions (Signed)
     IF you received an x-ray today, you will receive an invoice from [facility]. Please contact [facility] at [phone] with questions or concerns regarding your invoice.   IF you received labwork today, you will receive an invoice from [facility]. Please contact [facility] at [phone] with questions or concerns regarding your invoice.   Our billing staff will not be able to assist you with questions regarding bills from these companies.  You will be contacted with the lab results as soon as they are available. The fastest way to get your results is to activate your My Chart account. Instructions are located on the last page of this paperwork. If you have not heard from us regarding the results in 2 weeks, please contact this office.     

## 2017-10-09 ENCOUNTER — Encounter: Payer: Self-pay | Admitting: Family Medicine

## 2017-10-11 NOTE — Telephone Encounter (Signed)
Paperwork scanned and faxed on 10/11/17 °

## 2017-10-12 NOTE — Telephone Encounter (Signed)
Copied from [place] [phone]. Topic: Referral - Request >> Oct 12, 2017  4:46 PM Neva Seat wrote: Colonoscopy - need for the [facility] area -  Not the [facility] referral

## 2017-10-19 DIAGNOSIS — G4733 Obstructive sleep apnea (adult) (pediatric): Secondary | ICD-10-CM | POA: Diagnosis not present

## 2017-10-23 ENCOUNTER — Other Ambulatory Visit: Payer: Self-pay

## 2017-10-23 ENCOUNTER — Ambulatory Visit: Payer: Self-pay

## 2017-10-23 ENCOUNTER — Ambulatory Visit: Payer: 59 | Admitting: Physician Assistant

## 2017-10-23 ENCOUNTER — Encounter: Payer: Self-pay | Admitting: Physician Assistant

## 2017-10-23 VITALS — BP 102/62 | HR 113 | Temp 98.6°F | Resp 18 | Ht 67.0 in | Wt 312.2 lb

## 2017-10-23 DIAGNOSIS — R197 Diarrhea, unspecified: Secondary | ICD-10-CM | POA: Diagnosis not present

## 2017-10-23 DIAGNOSIS — Z9049 Acquired absence of other specified parts of digestive tract: Secondary | ICD-10-CM | POA: Diagnosis not present

## 2017-10-23 DIAGNOSIS — R000 Tachycardia, unspecified: Secondary | ICD-10-CM

## 2017-10-23 MED ORDER — ALBUTEROL SULFATE HFA 108 (90 BASE) MCG/ACT IN AERS: 2.0000 | INHALATION_SPRAY | Freq: Four times a day (QID) | RESPIRATORY_TRACT | 2 refills | Status: DC | PRN

## 2017-10-23 NOTE — Telephone Encounter (Signed)
Pt. Reports she has diarrhea since her appendectomy. 4-7 times a day. No vomiting. Has tried different changes to her diet. Has finished all antibiotics.No blood noted. States it is dark in color due to taking Pepto at intervals. Request appointment.  Reason for Disposition . [1] MODERATE diarrhea (e.g., 4-6 times / day more than normal) AND [2] present > 48 hours (2 days)  Answer Assessment - Initial Assessment Questions 1. DIARRHEA SEVERITY: "How bad is the diarrhea?" "How many extra stools have you had in the past 24 hours than normal?"    - MILD: Few loose or mushy BMs; increase of 1-3 stools over normal daily number of stools; mild increase in ostomy output.   - MODERATE: Increase of 4-6 stools daily over normal; moderate increase in ostomy output.   - SEVERE (or Worst Possible): Increase of 7 or more stools daily over normal; moderate increase in ostomy output; incontinence.     4 this morning 2. ONSET: "When did the diarrhea begin?"      Started "right after surgery" 3. BM CONSISTENCY: "How loose or watery is the diarrhea?"      Loose 4. VOMITING: "Are you also vomiting?" If so, ask: "How many times in the past 24 hours?"      No 5. ABDOMINAL PAIN: "Are you having any abdominal pain?" If yes: "What does it feel like?" (e.g., crampy, dull, intermittent, constant)      No, just urgency 6. ABDOMINAL PAIN SEVERITY: If present, ask: "How bad is the pain?"  (e.g., Scale 1-10; mild, moderate, or severe)    - MILD (1-3): doesn't interfere with normal activities, abdomen soft and not tender to touch     - MODERATE (4-7): interferes with normal activities or awakens from sleep, tender to touch     - SEVERE (8-10): excruciating pain, doubled over, unable to do any normal activities       No pain 7. ORAL INTAKE: If vomiting, "Have you been able to drink liquids?" "How much fluids have you had in the past 24 hours?"     No vomiting 8. HYDRATION: "Any signs of dehydration?" (e.g., dry mouth [not  just dry lips], too weak to stand, dizziness, new weight loss) "When did you last urinate?"     Not dehydrated 9. EXPOSURE: "Have you traveled to a foreign country recently?" "Have you been exposed to anyone with diarrhea?" "Could you have eaten any food that was spoiled?"     No 10. OTHER SYMPTOMS: "Do you have any other symptoms?" (e.g., fever, blood in stool)       No 11. PREGNANCY: "Is there any chance you are pregnant?" "When was your last menstrual period?"       No  Protocols used: DIARRHEA-A-AH

## 2017-10-23 NOTE — Patient Instructions (Signed)
     IF you received an x-ray today, you will receive an invoice from [facility]. Please contact [facility] at [phone] with questions or concerns regarding your invoice.   IF you received labwork today, you will receive an invoice from [facility]. Please contact [facility] at [phone] with questions or concerns regarding your invoice.   Our billing staff will not be able to assist you with questions regarding bills from these companies.  You will be contacted with the lab results as soon as they are available. The fastest way to get your results is to activate your My Chart account. Instructions are located on the last page of this paperwork. If you have not heard from us regarding the results in 2 weeks, please contact this office.     

## 2017-10-24 LAB — CMP14+EGFR
ALBUMIN: 3.3 g/dL — AB (ref 3.6–4.8)
ALT: 16 IU/L (ref 0–32)
AST: 16 IU/L (ref 0–40)
Albumin/Globulin Ratio: 0.8 — ABNORMAL LOW (ref 1.2–2.2)
Alkaline Phosphatase: 84 IU/L (ref 39–117)
BILIRUBIN TOTAL: 0.3 mg/dL (ref 0.0–1.2)
BUN / CREAT RATIO: 12 (ref 12–28)
BUN: 7 mg/dL — AB (ref 8–27)
CALCIUM: 8.7 mg/dL (ref 8.7–10.3)
CO2: 24 mmol/L (ref 20–29)
Chloride: 97 mmol/L (ref 96–106)
Creatinine, Ser: 0.6 mg/dL (ref 0.57–1.00)
GFR, EST AFRICAN AMERICAN: 115 mL/min/{1.73_m2} (ref >59)
GFR, EST NON AFRICAN AMERICAN: 99 mL/min/{1.73_m2} (ref >59)
GLUCOSE: 246 mg/dL — AB (ref 65–99)
Globulin, Total: 4 g/dL (ref 1.5–4.5)
Potassium: 4.9 mmol/L (ref 3.5–5.2)
Sodium: 139 mmol/L (ref 134–144)
TOTAL PROTEIN: 7.3 g/dL (ref 6.0–8.5)

## 2017-10-24 LAB — CBC WITH DIFFERENTIAL/PLATELET
BASOS ABS: 0 10*3/uL (ref 0.0–0.2)
BASOS: 0 %
EOS (ABSOLUTE): 0.2 10*3/uL (ref 0.0–0.4)
Eos: 3 %
HEMOGLOBIN: 11.9 g/dL (ref 11.1–15.9)
Hematocrit: 36.9 % (ref 34.0–46.6)
IMMATURE GRANS (ABS): 0 10*3/uL (ref 0.0–0.1)
IMMATURE GRANULOCYTES: 0 %
LYMPHS: 37 %
Lymphocytes Absolute: 2.8 10*3/uL (ref 0.7–3.1)
MCH: 26.7 pg (ref 26.6–33.0)
MCHC: 32.2 g/dL (ref 31.5–35.7)
MCV: 83 fL (ref 79–97)
MONOCYTES: 10 %
Monocytes Absolute: 0.7 10*3/uL (ref 0.1–0.9)
NEUTROS ABS: 3.7 10*3/uL (ref 1.4–7.0)
NEUTROS PCT: 50 %
PLATELETS: 457 10*3/uL — AB (ref 150–379)
RBC: 4.46 x10E6/uL (ref 3.77–5.28)
RDW: 14.1 % (ref 12.3–15.4)
WBC: 7.5 10*3/uL (ref 3.4–10.8)

## 2017-10-24 LAB — CLOSTRIDIUM DIFFICILE BY PCR: CDIFFPCR: NEGATIVE

## 2017-10-25 ENCOUNTER — Encounter: Payer: Self-pay | Admitting: Physician Assistant

## 2017-10-25 ENCOUNTER — Telehealth: Payer: Self-pay | Admitting: Family Medicine

## 2017-10-25 MED ORDER — DIPHENOXYLATE-ATROPINE 2.5-0.025 MG PO TABS: 1.0000 | ORAL_TABLET | Freq: Four times a day (QID) | ORAL | 0 refills | Status: DC | PRN

## 2017-10-25 NOTE — Progress Notes (Signed)
Terri Heath  MRN: [id number] DOB: July 24, 1956  PCP: [name], MD  Chief Complaint  Patient presents with  . Diarrhea    since appendectomy x2weeks     Subjective:  Pt presents to clinic for diarrhea for the last 2 weeks since her appendectomy.   pepto - no help with slowing down the diarrhea.  Everytime she eats or drinks - she has immediate diarrhea Eating scrabble eggs - small meals - she is not hungry - trying to eat bland foods but she has noticed no improvement in the diarrhea.  She has not been using her metformin because of the diarrhea happening after eating and therefore she is not eating and not wanting to take her metformin.  No heartburn or indigestion.    History is obtained by patient.  Review of Systems  Constitutional: Positive for appetite change (decreased). Negative for chills and fever.  Gastrointestinal: Positive for abdominal pain (mild cramping) and diarrhea. Negative for abdominal distention, blood in stool, constipation and nausea.    Patient Active Problem List   Diagnosis Date Noted  . Gangrenous appendicitis with perforation s/p lap appendectomy 10/04/2017 10/04/2017  . Allergic rhinitis 06/11/2014  . Depression 09/15/2013  . Asthma due to seasonal allergies 10/24/2012  . OSA (obstructive sleep apnea) 05/23/2011  . Diabetes mellitus type 2, insulin dependent ([place]) 03/26/2007  . Hyperlipemia 03/26/2007  . Morbid obesity, weight 368, BMI 57.7. 03/26/2007  . Essential hypertension 03/26/2007    Current Outpatient Medications on File Prior to Visit  Medication Sig Dispense Refill  . aspirin 325 MG tablet Take 650 mg by mouth every 4 (four) hours as needed (PAIN).    Marland Kitchen bismuth subsalicylate (PEPTO BISMOL) 262 MG chewable tablet Chew 524 mg by mouth as needed. Reported on 09/21/2015    . cetirizine (ZYRTEC) 10 MG tablet Take 10 mg by mouth daily.    . diclofenac sodium (VOLTAREN) 1 % GEL Apply 2 g topically 4 (four) times daily.  100 g 1  . metFORMIN (GLUCOPHAGE) 1000 MG tablet Take 1 tablet (1,000 mg total) by mouth 2 (two) times daily with a meal. 180 tablet 2  . Multiple Vitamin (MULITIVITAMIN WITH MINERALS) TABS Take 1 tablet by mouth daily with breakfast.    . naproxen sodium (ANAPROX) 220 MG tablet Take 220 mg by mouth 2 (two) times daily as needed.    Marland Kitchen UNABLE TO FIND Compression Stocking QD - 15-20 mmh     No current facility-administered medications on file prior to visit.     Allergies  Allergen Reactions  . Exenatide     Flu-like symptoms  . Lisinopril Other (See Comments)    cramping  . Other Diarrhea    Kale=food  . Statins Other (See Comments)    Leg cramps    Past Medical History:  Diagnosis Date  . Allergy   . Bilateral shoulder pain   . Borderline hypertension   . Bronchitis   . Diabetes mellitus    Diagnosed in 2000  . Hyperlipidemia   . Obesity   . Sleep apnea    Social History   Social History Narrative  . Not on file   Social History   Tobacco Use  . Smoking status: Former Smoker    Packs/day: 1.00    Years: 15.00    Pack years: 15.00    Types: Cigarettes    Last attempt to quit: 01/28/1990    Years since quitting: 27.7  . Smokeless tobacco: Never Used  Substance Use Topics  . Alcohol use: No  . Drug use: No   family history includes Alcohol abuse in her father and mother; Arthritis in her mother; Diabetes in her mother; Heart disease in her father; Sleep apnea in her mother and sister.     Objective:  BP 102/62   Pulse (!) 113   Temp 98.6 F (37 C) (Oral)   Resp 18   Ht 5' 7"  (1.702 m)   Wt (!) 312 lb 3.2 oz (141.6 kg)   SpO2 98%   BMI 48.90 kg/m  Body mass index is 48.9 kg/m.  Wt Readings from Last 3 Encounters:  10/23/17 (!) 312 lb 3.2 oz (141.6 kg)  10/08/17 291 lb 12.8 oz (132.4 kg)  10/04/17 (!) 314 lb (142.4 kg)     Physical Exam  Constitutional: She is oriented to person, place, and time and well-developed, well-nourished, and in no  distress.  HENT:  Head: Normocephalic and atraumatic.  Right Ear: Hearing and external ear normal.  Left Ear: Hearing and external ear normal.  Eyes: Conjunctivae are normal.  Neck: Normal range of motion.  Cardiovascular: Normal rate, regular rhythm and normal heart sounds.  No murmur heard. Pulmonary/Chest: Effort normal and breath sounds normal. She has no wheezes.  Abdominal: There is tenderness (mild generalized - more associated with areas around her healing incisions).  Neurological: She is alert and oriented to person, place, and time. Gait normal.  Skin: Skin is warm and dry.  Healing incisions on abdomen  Psychiatric: Mood, memory, affect and judgment normal.  Vitals reviewed.   Results for orders placed or performed in visit on 10/23/17  Clostridium Difficile by PCR  Result Value Ref Range   Toxigenic C. Difficile by PCR Negative Negative  CBC with Differential/Platelet  Result Value Ref Range   WBC 7.5 3.4 - 10.8 x10E3/uL   RBC 4.46 3.77 - 5.28 x10E6/uL   Hemoglobin 11.9 11.1 - 15.9 g/dL   Hematocrit 36.9 34.0 - 46.6 %   MCV 83 79 - 97 fL   MCH 26.7 26.6 - 33.0 pg   MCHC 32.2 31.5 - 35.7 g/dL   RDW 14.1 12.3 - 15.4 %   Platelets 457 (H) 150 - 379 x10E3/uL   Neutrophils 50 Not Estab. %   Lymphs 37 Not Estab. %   Monocytes 10 Not Estab. %   Eos 3 Not Estab. %   Basos 0 Not Estab. %   Neutrophils Absolute 3.7 1.4 - 7.0 x10E3/uL   Lymphocytes Absolute 2.8 0.7 - 3.1 x10E3/uL   Monocytes Absolute 0.7 0.1 - 0.9 x10E3/uL   EOS (ABSOLUTE) 0.2 0.0 - 0.4 x10E3/uL   Basophils Absolute 0.0 0.0 - 0.2 x10E3/uL   Immature Granulocytes 0 Not Estab. %   Immature Grans (Abs) 0.0 0.0 - 0.1 x10E3/uL  CMP14+EGFR  Result Value Ref Range   Glucose 246 (H) 65 - 99 mg/dL   BUN 7 (L) 8 - 27 mg/dL   Creatinine, Ser 0.60 0.57 - 1.00 mg/dL   GFR calc non Af Amer 99 >59 mL/min/1.73   GFR calc Af Amer 115 >59 mL/min/1.73   BUN/Creatinine Ratio 12 12 - 28   Sodium 139 134 - 144 mmol/L    Potassium 4.9 3.5 - 5.2 mmol/L   Chloride 97 96 - 106 mmol/L   CO2 24 20 - 29 mmol/L   Calcium 8.7 8.7 - 10.3 mg/dL   Total Protein 7.3 6.0 - 8.5 g/dL   Albumin 3.3 (L) 3.6 - 4.8  g/dL   Globulin, Total 4.0 1.5 - 4.5 g/dL   Albumin/Globulin Ratio 0.8 (L) 1.2 - 2.2   Bilirubin Total 0.3 0.0 - 1.2 mg/dL   Alkaline Phosphatase 84 39 - 117 IU/L   AST 16 0 - 40 IU/L   ALT 16 0 - 32 IU/L    Assessment and Plan :  Diarrhea, unspecified type - Plan: albuterol (PROAIR HFA) 108 (90 Base) MCG/ACT inhaler, Clostridium Difficile by PCR, CBC with Differential/Platelet, CMP14+EGFR, diphenoxylate-atropine (LOMOTIL) 2.5-0.025 MG tablet  Status post appendectomy  Tachycardia likely dehydration  Checks labs - lomotil was sent to pharmacy after her C diff returned neg.  If she continues with diarrhea after 4 ays of lomotoil allowing her to return to normal eating we will consider sending out other stool cultures but there is nothing to show evidence of these types of infections.  ? Functional diarrhea from surgery and then decrease eating.  Pts weight has stabilized to surgery day weight.  Windell Hummingbird PA-C  [facility] 10/25/2017 2:57 PM

## 2017-10-25 NOTE — Telephone Encounter (Signed)
Copied from [place]. Topic: Quick Communication - See Telephone Encounter >> Oct 25, 2017  8:10 AM Lolita Rieger, RMA wrote: CRM for notification. See Telephone encounter for:   10/25/17.pt would like a call back to discuss her recent visit and to discuss FMLA paperwork please call [phone]

## 2017-10-25 NOTE — Telephone Encounter (Signed)
Patient called and paperwork was updated and refaxed

## 2017-10-29 NOTE — Telephone Encounter (Signed)
Patient would like to know if everything is completed with FMLA. Call back [phone]

## 2017-10-30 NOTE — Telephone Encounter (Signed)
Spoke with pt advised updated FMLA paperwork faxed to her employer.  Pt appreciative. Dgaddy, CMA

## 2017-11-12 ENCOUNTER — Encounter: Payer: Self-pay | Admitting: Family Medicine

## 2017-11-16 DIAGNOSIS — G4733 Obstructive sleep apnea (adult) (pediatric): Secondary | ICD-10-CM | POA: Diagnosis not present

## 2017-11-19 ENCOUNTER — Ambulatory Visit: Payer: 59 | Admitting: Adult Health

## 2017-12-06 DIAGNOSIS — H52223 Regular astigmatism, bilateral: Secondary | ICD-10-CM | POA: Diagnosis not present

## 2017-12-06 DIAGNOSIS — H524 Presbyopia: Secondary | ICD-10-CM | POA: Diagnosis not present

## 2017-12-17 DIAGNOSIS — G4733 Obstructive sleep apnea (adult) (pediatric): Secondary | ICD-10-CM | POA: Diagnosis not present

## 2018-01-02 MED FILL — metFORMIN HCL 1000 MG TABS: 1000 | 90 days supply | Qty: 180 | Fill #2

## 2018-01-10 MED FILL — DICLOFENAC SODIUM 1% GEL: 1 | 13 days supply | Qty: 100 | Fill #1

## 2018-01-14 ENCOUNTER — Ambulatory Visit (INDEPENDENT_AMBULATORY_CARE_PROVIDER_SITE_OTHER)
Admission: RE | Admit: 2018-01-14 | Discharge: 2018-01-14 | Disposition: A | Discharge status: Home / Self Care | Payer: 59 | Source: Ambulatory Visit | Attending: Adult Health | Admitting: Adult Health

## 2018-01-14 ENCOUNTER — Ambulatory Visit: Payer: 59 | Admitting: Adult Health

## 2018-01-14 ENCOUNTER — Other Ambulatory Visit: Payer: Self-pay | Admitting: Adult Health

## 2018-01-14 ENCOUNTER — Encounter: Payer: Self-pay | Admitting: Adult Health

## 2018-01-14 ENCOUNTER — Other Ambulatory Visit (INDEPENDENT_AMBULATORY_CARE_PROVIDER_SITE_OTHER): Payer: 59

## 2018-01-14 VITALS — BP 124/90 | HR 102 | Ht 66.0 in | Wt 313.0 lb

## 2018-01-14 DIAGNOSIS — R0600 Dyspnea, unspecified: Secondary | ICD-10-CM

## 2018-01-14 DIAGNOSIS — R05 Cough: Secondary | ICD-10-CM | POA: Diagnosis not present

## 2018-01-14 DIAGNOSIS — R0602 Shortness of breath: Secondary | ICD-10-CM

## 2018-01-14 DIAGNOSIS — J45909 Unspecified asthma, uncomplicated: Secondary | ICD-10-CM | POA: Diagnosis not present

## 2018-01-14 DIAGNOSIS — R7989 Other specified abnormal findings of blood chemistry: Secondary | ICD-10-CM

## 2018-01-14 HISTORY — DX: Dyspnea, unspecified: R06.00

## 2018-01-14 LAB — BRAIN NATRIURETIC PEPTIDE: Pro B Natriuretic peptide (BNP): 308 pg/mL — ABNORMAL HIGH (ref 0.0–100.0)

## 2018-01-14 LAB — BASIC METABOLIC PANEL
BUN: 12 mg/dL (ref 6–23)
CHLORIDE: 100 meq/L (ref 96–112)
CO2: 26 mEq/L (ref 19–32)
Calcium: 9.1 mg/dL (ref 8.4–10.5)
Creatinine, Ser: 0.79 mg/dL (ref 0.40–1.20)
GFR: 78.66 mL/min (ref >60.00)
Glucose, Bld: 206 mg/dL — ABNORMAL HIGH (ref 70–99)
POTASSIUM: 5 meq/L (ref 3.5–5.1)
SODIUM: 136 meq/L (ref 135–145)

## 2018-01-14 LAB — CBC WITH DIFFERENTIAL/PLATELET
Basophils Absolute: 0.1 10*3/uL (ref 0.0–0.1)
Basophils Relative: 1.2 % (ref 0.0–3.0)
EOS PCT: 1 % (ref 0.0–5.0)
Eosinophils Absolute: 0.1 10*3/uL (ref 0.0–0.7)
HCT: 43.4 % (ref 36.0–46.0)
HEMOGLOBIN: 14 g/dL (ref 12.0–15.0)
Lymphocytes Relative: 43.8 % (ref 12.0–46.0)
Lymphs Abs: 2.9 10*3/uL (ref 0.7–4.0)
MCHC: 32.3 g/dL (ref 30.0–36.0)
MCV: 81.6 fl (ref 78.0–100.0)
MONOS PCT: 7.5 % (ref 3.0–12.0)
Monocytes Absolute: 0.5 10*3/uL (ref 0.1–1.0)
Neutro Abs: 3.1 10*3/uL (ref 1.4–7.7)
Neutrophils Relative %: 46.5 % (ref 43.0–77.0)
Platelets: 232 10*3/uL (ref 150.0–400.0)
RBC: 5.32 Mil/uL — AB (ref 3.87–5.11)
RDW: 17.1 % — ABNORMAL HIGH (ref 11.5–15.5)
WBC: 6.7 10*3/uL (ref 4.0–10.5)

## 2018-01-14 LAB — NITRIC OXIDE: Nitric Oxide: 10

## 2018-01-14 LAB — D-DIMER, QUANTITATIVE (NOT AT ARMC): D DIMER QUANT: 3.82 ug{FEU}/mL — AB (ref <0.50)

## 2018-01-14 MED ORDER — BUDESONIDE-FORMOTEROL FUMARATE 80-4.5 MCG/ACT IN AERO: 2.0000 | INHALATION_SPRAY | Freq: Two times a day (BID) | RESPIRATORY_TRACT | 5 refills | Status: DC

## 2018-01-14 MED ORDER — BUDESONIDE-FORMOTEROL FUMARATE 80-4.5 MCG/ACT IN AERO: 2.0000 | INHALATION_SPRAY | Freq: Two times a day (BID) | RESPIRATORY_TRACT | 0 refills | Status: DC

## 2018-01-14 MED FILL — SYMBICORT 80-4.5 MCG INH: 80-4.5 | 30 days supply | Qty: 10 | Fill #0

## 2018-01-14 NOTE — Addendum Note (Signed)
Addended by: Parke Poisson E on: 01/14/2018 04:21 PM   Modules accepted: Orders

## 2018-01-14 NOTE — Patient Instructions (Signed)
Begin Symbicort 80 2 puffs twice daily, rinse after use.  Chest x-ray today Labs today Use albuterol 2 puffs every 4 hours as needed for cough and wheezing follow up with Dr. Elsworth Soho  In 6 weeks and As needed   Please contact office for sooner follow up if symptoms do not improve or worsen or seek emergency care

## 2018-01-14 NOTE — Assessment & Plan Note (Signed)
Possible asthma flare.  Will begin Symbicort trial. Hold on additional steroids as patient has uncontrolled diabetes.  Plan  Patient Instructions  Begin Symbicort 80 2 puffs twice daily, rinse after use.  Chest x-ray today Labs today Use albuterol 2 puffs every 4 hours as needed for cough and wheezing follow up with Dr. Elsworth Soho  In 6 weeks and As needed   Please contact office for sooner follow up if symptoms do not improve or worsen or seek emergency care

## 2018-01-14 NOTE — Progress Notes (Signed)
@Patient  ID: Terri Heath, female    DOB: Jan 17, 1956, 62 y.o.   MRN: [id number]  Chief Complaint  Patient presents with  . 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 She has gained about 30 pounds since the study.  Download 10/8 -07/03/11 >>good complaince ,GOod control of events on 12 cm  SPirometry showed FEv1 64% with ratio of 72, FVC was 71% - mild restriction  01/14/2018 Acute OV : Asthma  Patient presents for an acute office visit.  Complains of 1 month of increased asthma symptoms. Has increased shortness of breath , cough -productive cough , rattling , wheezing . Increased Albuterol use . Over last month, using albuterol 4 x day . Relieves dyspnea Trying to take Zyrtec to see if this is related to allergies. No increased nasal congestion .  No chest pain , orthopnea, calf pain or hemoptysis . Has had increased edema . Wt has increased .  Since having emergent appendectomy in February 2019 , she has had more symptoms . Seems breathing has not been good since surgery .   Exhaled nitric oxide testing today was 10ppb Spirometry today shows more restrictive pattern with FEV1 53%, ratio 72, FVC 57 Has been having trouble with DM , BS running in 200 range.    Allergies  Allergen Reactions  . Exenatide     Flu-like symptoms  . Lisinopril Other (See Comments)    cramping  . Other Diarrhea    Kale=food  . Statins Other (See Comments)    Leg cramps    Immunization History  Administered Date(s) Administered  . Influenza Split 05/23/2011, 05/09/2012  . Influenza,inj,Quad PF,6+ Mos 05/07/2017  . MMR 11/29/2000  . Pneumococcal Polysaccharide-23 08/25/2005  . Td 08/25/2005    Past Medical History:  Diagnosis Date  . Allergy   .  Bilateral shoulder pain   . Borderline hypertension   . Bronchitis   . Diabetes mellitus    Diagnosed in 2000  . Hyperlipidemia   . Obesity   . Sleep apnea     Tobacco History: Social History   Tobacco Use  Smoking Status Former Smoker  . Packs/day: 1.00  . Years: 15.00  . Pack years: 15.00  . Types: Cigarettes  . Last attempt to quit: 01/28/1990  . Years since quitting: 27.9  Smokeless Tobacco Never Used   Counseling given: Not Answered   Outpatient Encounter Medications as of 01/14/2018  Medication Sig  . albuterol (PROAIR HFA) 108 (90 Base) MCG/ACT inhaler Inhale 2 puffs into the lungs every 6 (six) hours as needed. wheezing  . aspirin 325 MG tablet Take 650 mg by mouth every 4 (four) hours as needed (PAIN).  Marland Kitchen bismuth subsalicylate (PEPTO BISMOL) 262 MG chewable tablet Chew 524 mg by mouth as needed. Reported on 09/21/2015  . cetirizine (ZYRTEC) 10 MG tablet Take 10 mg by mouth daily.  . diclofenac sodium (VOLTAREN) 1 % GEL Apply 2 g topically 4 (four) times daily.  . metFORMIN (GLUCOPHAGE) 1000 MG tablet Take 1 tablet (1,000 mg total) by mouth 2 (two) times daily with a meal.  . Multiple Vitamin (MULITIVITAMIN WITH MINERALS) TABS Take 1 tablet by mouth daily with breakfast.  . naproxen sodium (ANAPROX)  220 MG tablet Take 220 mg by mouth 2 (two) times daily as needed.  Marland Kitchen UNABLE TO FIND Compression Stocking QD - 15-20 mmh  . [DISCONTINUED] diphenoxylate-atropine (LOMOTIL) 2.5-0.025 MG tablet Take 1 tablet by mouth 4 (four) times daily as needed for diarrhea or loose stools.   No facility-administered encounter medications on file as of 01/14/2018.      Review of Systems  Constitutional:   No  weight loss, night sweats,  Fevers, chills, fatigue, or  lassitude.  HEENT:   No headaches,  Difficulty swallowing,  Tooth/dental problems, or  Sore throat,                No sneezing, itching, ear ache, nasal congestion, post nasal drip,   CV:  No chest pain,  Orthopnea, PND,  swelling in lower extremities, anasarca, dizziness, palpitations, syncope.   GI  No heartburn, indigestion, abdominal pain, nausea, vomiting, diarrhea, change in bowel habits, loss of appetite, bloody stools.   Resp:   No chest wall deformity  Skin: no rash or lesions.  GU: no dysuria, change in color of urine, no urgency or frequency.  No flank pain, no hematuria   MS:  No joint pain or swelling.  No decreased range of motion.  No back pain.    Physical Exam  BP 124/90 (BP Location: Right Arm, Cuff Size: Normal)   Pulse (!) 102   Ht 5' 6"  (1.676 m)   Wt (!) 313 lb (142 kg)   SpO2 94%   BMI 50.52 kg/m   GEN: A/Ox3; pleasant , NAD, obese    HEENT:  [place]/AT,  EACs-clear, TMs-wnl, NOSE-clear, THROAT-clear, no lesions, no postnasal drip or exudate noted.   NECK:  Supple w/ fair ROM; no JVD; normal carotid impulses w/o bruits; no thyromegaly or nodules palpated; no lymphadenopathy.    RESP  Clear  P & A; w/o, wheezes/ rales/ or rhonchi. no accessory muscle use, no dullness to percussion  CARD:  RRR, no m/r/g, 1+ peripheral edema, pulses intact, no cyanosis or clubbing.  GI:   Soft & nt; nml bowel sounds; no organomegaly or masses detected.   Musco: Warm bil, no deformities or joint swelling noted.   Neuro: alert, no focal deficits noted.    Skin: Warm, no lesions or rashes    Lab Results:  CBC    Component Value Date/Time   WBC 7.5 10/23/2017 1752   WBC 14.7 (H) 10/03/2017 2005   RBC 4.46 10/23/2017 1752   RBC 5.62 (H) 10/03/2017 2005   HGB 11.9 10/23/2017 1752   HCT 36.9 10/23/2017 1752   PLT 457 (H) 10/23/2017 1752   MCV 83 10/23/2017 1752   MCH 26.7 10/23/2017 1752   MCH 28.6 10/03/2017 2005   MCHC 32.2 10/23/2017 1752   MCHC 34.5 10/03/2017 2005   RDW 14.1 10/23/2017 1752   LYMPHSABS 2.8 10/23/2017 1752   MONOABS 1.0 10/03/2017 2005   EOSABS 0.2 10/23/2017 1752   BASOSABS 0.0 10/23/2017 1752    BMET    Component Value Date/Time   NA 139 10/23/2017  1752   K 4.9 10/23/2017 1752   CL 97 10/23/2017 1752   CO2 24 10/23/2017 1752   GLUCOSE 246 (H) 10/23/2017 1752   GLUCOSE 287 (H) 10/03/2017 2005   BUN 7 (L) 10/23/2017 1752   CREATININE 0.60 10/23/2017 1752   CREATININE 0.70 03/14/2016 1228   CALCIUM 8.7 10/23/2017 1752   GFRNONAA 99 10/23/2017 1752   GFRNONAA >89 03/14/2016 1228   GFRAA  115 10/23/2017 1752   GFRAA >89 03/14/2016 1228    BNP No results found for: BNP  ProBNP No results found for: PROBNP  Imaging: Dg Chest 2 View  Result Date: 01/14/2018 CLINICAL DATA:  Cough and dyspnea for 2 weeks EXAM: CHEST - 2 VIEW COMPARISON:  02/09/10 FINDINGS: Cardiac shadow is at the upper limits of normal in size. The lungs are well aerated bilaterally. No focal infiltrate or sizable effusion is seen. No acute bony abnormality is noted. IMPRESSION: No active cardiopulmonary disease. Electronically Signed   By: Inez Catalina M.D.   On: 01/14/2018 15:02     Assessment & Plan:   Asthma due to seasonal allergies Possible asthma flare.  Will begin Symbicort trial. Hold on additional steroids as patient has uncontrolled diabetes.  Plan  Patient Instructions  Begin Symbicort 80 2 puffs twice daily, rinse after use.  Chest x-ray today Labs today Use albuterol 2 puffs every 4 hours as needed for cough and wheezing follow up with Dr. Elsworth Soho  In 6 weeks and As needed   Please contact office for sooner follow up if symptoms do not improve or worsen or seek emergency care       Dyspnea Progressive dyspnea since appendectomy in February 2019 Question by etiology.  May be underlying asthma.  Will begin Symbicort. Check chest x-ray labs including d-dimer.  Patient does have some peripheral edema.  Check BMP if elevated will need echo.  Plan  Patient Instructions  Begin Symbicort 80 2 puffs twice daily, rinse after use.  Chest x-ray today Labs today Use albuterol 2 puffs every 4 hours as needed for cough and wheezing follow up with Dr.  Elsworth Soho  In 6 weeks and As needed   Please contact office for sooner follow up if symptoms do not improve or worsen or seek emergency care          Rexene Edison, NP 01/14/2018

## 2018-01-14 NOTE — Progress Notes (Signed)
Orders placed, both as STAT. Will follow up on 01/15/18.

## 2018-01-14 NOTE — Assessment & Plan Note (Signed)
Progressive dyspnea since appendectomy in February 2019 Question by etiology.  May be underlying asthma.  Will begin Symbicort. Check chest x-ray labs including d-dimer.  Patient does have some peripheral edema.  Check BMP if elevated will need echo.  Plan  Patient Instructions  Begin Symbicort 80 2 puffs twice daily, rinse after use.  Chest x-ray today Labs today Use albuterol 2 puffs every 4 hours as needed for cough and wheezing follow up with Dr. Elsworth Soho  In 6 weeks and As needed   Please contact office for sooner follow up if symptoms do not improve or worsen or seek emergency care

## 2018-01-15 ENCOUNTER — Emergency Department (HOSPITAL_COMMUNITY)
Admission: EM | Admit: 2018-01-15 | Discharge: 2018-01-15 | Disposition: A | Discharge status: Home / Self Care | Payer: 59 | Attending: Emergency Medicine | Admitting: Emergency Medicine

## 2018-01-15 ENCOUNTER — Emergency Department (HOSPITAL_COMMUNITY): Payer: 59

## 2018-01-15 ENCOUNTER — Encounter (HOSPITAL_COMMUNITY): Payer: Self-pay | Admitting: Emergency Medicine

## 2018-01-15 ENCOUNTER — Other Ambulatory Visit: Payer: Self-pay

## 2018-01-15 DIAGNOSIS — J45909 Unspecified asthma, uncomplicated: Secondary | ICD-10-CM | POA: Diagnosis not present

## 2018-01-15 DIAGNOSIS — R0602 Shortness of breath: Secondary | ICD-10-CM | POA: Diagnosis not present

## 2018-01-15 DIAGNOSIS — I10 Essential (primary) hypertension: Secondary | ICD-10-CM | POA: Diagnosis not present

## 2018-01-15 DIAGNOSIS — Z87891 Personal history of nicotine dependence: Secondary | ICD-10-CM | POA: Insufficient documentation

## 2018-01-15 DIAGNOSIS — G4733 Obstructive sleep apnea (adult) (pediatric): Secondary | ICD-10-CM | POA: Diagnosis not present

## 2018-01-15 DIAGNOSIS — Z79899 Other long term (current) drug therapy: Secondary | ICD-10-CM | POA: Diagnosis not present

## 2018-01-15 DIAGNOSIS — E119 Type 2 diabetes mellitus without complications: Secondary | ICD-10-CM | POA: Insufficient documentation

## 2018-01-15 DIAGNOSIS — Z7984 Long term (current) use of oral hypoglycemic drugs: Secondary | ICD-10-CM | POA: Insufficient documentation

## 2018-01-15 MED ORDER — IOPAMIDOL (ISOVUE-370) INJECTION 76%
100.0000 mL | Freq: Once | INTRAVENOUS | Status: AC | PRN
  Administered 2018-01-15: 100 mL via INTRAVENOUS

## 2018-01-15 MED ORDER — IOPAMIDOL (ISOVUE-370) INJECTION 76%
INTRAVENOUS | Status: AC
  Filled 2018-01-15: qty 100

## 2018-01-15 NOTE — Progress Notes (Signed)
Reviewed & agree with plan  

## 2018-01-15 NOTE — ED Provider Notes (Signed)
[facility] DEPT Provider Note   CSN: [id number] Arrival date & time: 01/15/18  0008  Time seen 02:53 AM   History   Chief Complaint Chief Complaint  Patient presents with  . abnormal labs    HPI Terri Heath is a 62 y.o. female.  HPI patient states she has a diagnosis of reactive airway disease and she has had no allergy problems for the past 4 years.  However she states this year she has been having a lot of problems.  She states she had a gangrenous appendix and had surgery on February 6.  She states since March she has been having shortness of breath, coughing, wheezing.  She states her inhalers have not really been helping.  She states she went to see her PCP today and had lab work done.  She was called this evening and was advised her d-dimer was high and she should come to the hospital for further testing however she was wanting to wait till tomorrow.  However she states tonight when she was laying down she had pain between her shoulder blades which she is been having before and shortness of breath so she decided to come in.  She states her coughing is getting worse and her wheezing is getting worse.  She denies any chest pain.  She states she has been having swelling in her left lower extremity which is old.  She states her shortness of breath has been getting worse the past 5 days.  PCP [name], MD   Past Medical History:  Diagnosis Date  . Allergy   . Bilateral shoulder pain   . Borderline hypertension   . Bronchitis   . Diabetes mellitus    Diagnosed in 2000  . Hyperlipidemia   . Obesity   . Sleep apnea     Patient Active Problem List   Diagnosis Date Noted  . Dyspnea 01/14/2018  . Gangrenous appendicitis with perforation s/p lap appendectomy 10/04/2017 10/04/2017  . Allergic rhinitis 06/11/2014  . Depression 09/15/2013  . Asthma due to seasonal allergies 10/24/2012  . OSA (obstructive sleep apnea) 05/23/2011  .  Diabetes mellitus type 2, insulin dependent ([place]) 03/26/2007  . Hyperlipemia 03/26/2007  . Morbid obesity, weight 368, BMI 57.7. 03/26/2007  . Essential hypertension 03/26/2007    Past Surgical History:  Procedure Laterality Date  . BREATH TEK H PYLORI  09/18/2011   Procedure: BREATH TEK H PYLORI;  Surgeon: Shann Medal, MD;  Location: [facility];  Service: General;  Laterality: N/A;  . CESAREAN SECTION     x 3  . LAPAROSCOPIC APPENDECTOMY N/A 10/03/2017   Procedure: APPENDECTOMY LAPAROSCOPIC;  Surgeon: Leighton Ruff, MD;  Location: [facility];  Service: General;  Laterality: N/A;     OB History   None      Home Medications    Prior to Admission medications   Medication Sig Start Date End Date Taking? Authorizing Provider  albuterol (PROAIR HFA) 108 (90 Base) MCG/ACT inhaler Inhale 2 puffs into the lungs every 6 (six) hours as needed. wheezing 10/23/17   Gale Journey, Damaris Hippo, PA-C  aspirin 325 MG tablet Take 650 mg by mouth every 4 (four) hours as needed (PAIN).    [provider]  bismuth subsalicylate (PEPTO BISMOL) 262 MG chewable tablet Chew 524 mg by mouth as needed. Reported on 09/21/2015    [provider]  budesonide-formoterol (SYMBICORT) 80-4.5 MCG/ACT inhaler Inhale 2 puffs into the lungs 2 (two) times daily. 01/14/18  Rigoberto Noel, MD  budesonide-formoterol [facility]) 80-4.5 MCG/ACT inhaler Inhale 2 puffs into the lungs 2 (two) times daily. 01/14/18   Parrett, Fonnie Mu, NP  cetirizine (ZYRTEC) 10 MG tablet Take 10 mg by mouth daily.    [provider]  diclofenac sodium (VOLTAREN) 1 % GEL Apply 2 g topically 4 (four) times daily. 05/07/17   [name], MD  metFORMIN (GLUCOPHAGE) 1000 MG tablet Take 1 tablet (1,000 mg total) by mouth 2 (two) times daily with a meal. 01/18/17   Alveda Reasons, MD  Multiple Vitamin (MULITIVITAMIN WITH MINERALS) TABS Take 1 tablet by mouth daily with breakfast.    [provider]  naproxen sodium (ANAPROX)  220 MG tablet Take 220 mg by mouth 2 (two) times daily as needed.    [provider]  UNABLE TO FIND Compression Stocking QD - 15-20 mmh    [provider]    Family History Family History  Problem Relation Age of Onset  . Alcohol abuse Mother   . Arthritis Mother   . Diabetes Mother   . Sleep apnea Mother   . Alcohol abuse Father   . Heart disease Father   . Sleep apnea Sister     Social History Social History   Tobacco Use  . Smoking status: Former Smoker    Packs/day: 1.00    Years: 15.00    Pack years: 15.00    Types: Cigarettes    Last attempt to quit: 01/28/1990    Years since quitting: 27.9  . Smokeless tobacco: Never Used  Substance Use Topics  . Alcohol use: No  . Drug use: No  employed   Allergies   Exenatide; Lisinopril; Other; and Statins   Review of Systems Review of Systems  All other systems reviewed and are negative.    Physical Exam Updated Vital Signs BP 106/64   Pulse 60   Temp 97.9 F (36.6 C) (Oral)   Resp 18   SpO2 97%   Vital signs normal    Physical Exam  Constitutional: She is oriented to person, place, and time. She appears well-developed and well-nourished.  Non-toxic appearance. She does not appear ill. No distress.  HENT:  Head: Normocephalic and atraumatic.  Right Ear: External ear normal.  Left Ear: External ear normal.  Nose: Nose normal. No mucosal edema or rhinorrhea.  Mouth/Throat: Oropharynx is clear and moist and mucous membranes are normal. No dental abscesses or uvula swelling.  Eyes: Pupils are equal, round, and reactive to light. Conjunctivae and EOM are normal.  Neck: Normal range of motion and full passive range of motion without pain. Neck supple.  Cardiovascular: Normal rate, regular rhythm and normal heart sounds. Exam reveals no gallop and no friction rub.  No murmur heard. Pulmonary/Chest: Effort normal and breath sounds normal. No respiratory distress. She has no wheezes. She has no  rhonchi. She has no rales. She exhibits no tenderness and no crepitus.  Abdominal: Normal appearance.  Musculoskeletal: Normal range of motion. She exhibits no edema or tenderness.  Moves all extremities well.  Nontender calves  Neurological: She is alert and oriented to person, place, and time. She has normal strength. No cranial nerve deficit.  Skin: Skin is warm, dry and intact. No rash noted. No erythema. No pallor.  Psychiatric: She has a normal mood and affect. Her speech is normal and behavior is normal. Her mood appears not anxious.  Nursing note and vitals reviewed.    ED Treatments / Results  Labs (all labs ordered are listed, but only abnormal results are displayed) Labs Reviewed - No data to display  CBC    Component Value Date/Time   WBC 6.7 01/14/2018 1446   RBC 5.32 (H) 01/14/2018 1446   HGB 14.0 01/14/2018 1446   HGB 11.9 10/23/2017 1752   HCT 43.4 01/14/2018 1446   HCT 36.9 10/23/2017 1752   PLT 232.0 01/14/2018 1446   PLT 457 (H) 10/23/2017 1752   MCV 81.6 01/14/2018 1446   MCV 83 10/23/2017 1752   MCH 26.7 10/23/2017 1752   MCH 28.6 10/03/2017 2005   MCHC 32.3 01/14/2018 1446   RDW 17.1 (H) 01/14/2018 1446   RDW 14.1 10/23/2017 1752   LYMPHSABS 2.9 01/14/2018 1446   LYMPHSABS 2.8 10/23/2017 1752   MONOABS 0.5 01/14/2018 1446   EOSABS 0.1 01/14/2018 1446   EOSABS 0.2 10/23/2017 1752   BASOSABS 0.1 01/14/2018 1446   BASOSABS 0.0 10/23/2017 1752   BMET    Component Value Date/Time   NA 136 01/14/2018 1446   NA 139 10/23/2017 1752   K 5.0 01/14/2018 1446   CL 100 01/14/2018 1446   CO2 26 01/14/2018 1446   GLUCOSE 206 (H) 01/14/2018 1446   BUN 12 01/14/2018 1446   BUN 7 (L) 10/23/2017 1752   CREATININE 0.79 01/14/2018 1446   CREATININE 0.70 03/14/2016 1228   CALCIUM 9.1 01/14/2018 1446   GFRNONAA 99 10/23/2017 1752   GFRNONAA >89 03/14/2016 1228   GFRAA 115 10/23/2017 1752   GFRAA >89 03/14/2016 1228   BNP 308 mildly elevated  Ddimer 3.82  elevated  Blood work done earlier today is noted.    EKG None  Radiology Dg Chest 2 View  Result Date: 01/14/2018 CLINICAL DATA:  Cough and dyspnea for 2 weeks EXAM: CHEST - 2 VIEW COMPARISON:  02/09/10 FINDINGS: Cardiac shadow is at the upper limits of normal in size. The lungs are well aerated bilaterally. No focal infiltrate or sizable effusion is seen. No acute bony abnormality is noted. IMPRESSION: No active cardiopulmonary disease. Electronically Signed   By: Inez Catalina M.D.   On: 01/14/2018 15:02   Ct Angio Chest Pe W/cm &/or Wo Cm  Result Date: 01/15/2018 CLINICAL DATA:  Shortness of breath.  Elevated D-dimer. EXAM: CT ANGIOGRAPHY CHEST WITH CONTRAST TECHNIQUE: Multidetector CT imaging of the chest was performed using the standard protocol during bolus administration of intravenous contrast. Multiplanar CT image reconstructions and MIPs were obtained to evaluate the vascular anatomy. CONTRAST:  11mL ISOVUE-370 IOPAMIDOL (ISOVUE-370) INJECTION 76% COMPARISON:  Chest radiograph yesterday. FINDINGS: Cardiovascular: There are no filling defects within the pulmonary arteries to the segmental level to suggest pulmonary embolus. Subsegmental branches cannot be assessed due to bolus timing. Mild cardiomegaly. Thoracic aorta is normal in caliber without dissection. Mild aortic atherosclerosis. Mediastinum/Nodes: Small scattered mediastinal nodes largest 10 mm short axis. Few prominent bilateral hilar nodes, not enlarged by size criteria. Esophagus is decompressed. No thyroid nodule. Lungs/Pleura: Heterogeneous lung parenchyma. Mild lower lobe bronchial thickening. No septal thickening. Linear atelectasis in the left lower lobe. No confluent consolidation. No pleural fluid. No pulmonary mass or suspicious nodule. Upper Abdomen: Contrast refluxes into the hepatic veins and IVC. Musculoskeletal: There are no acute or suspicious osseous abnormalities. Degenerative change in the spine. Review of the MIP  images confirms the above findings. IMPRESSION: 1. No pulmonary embolus. 2. Multi chamber cardiomegaly with contrast refluxing into the hepatic veins and IVC, suggesting elevated right heart pressures. 3. Heterogeneous lung parenchyma, suggestive of  small airways disease. Mild lower lobe bronchial thickening. Aortic Atherosclerosis (ICD10-I70.0). Electronically Signed   By: Jeb Levering M.D.   On: 01/15/2018 05:24    Procedures Procedures (including critical care time)  Medications Ordered in ED Medications  iopamidol (ISOVUE-370) 76 % injection (has no administration in time range)  iopamidol (ISOVUE-370) 76 % injection 100 mL (100 mLs Intravenous Contrast Given 01/15/18 0430)     Initial Impression / Assessment and Plan / ED Course  I have reviewed the triage vital signs and the nursing notes.  Pertinent labs & imaging results that were available during my care of the patient were reviewed by me and considered in my medical decision making (see chart for details).     Patient CTA is negative for acute PE.  She does have some elevation of the right heart pressures and her primary care doctor is planning on proceeding with echocardiogram.  Patient was discharged home to follow-up with her PCP for further evaluation.  She will be encouraged to use her inhaler more.  5:35 AM patient and her son were given her test results. She is sitting at the bedside in no distress drinking coffee.  When I look at her primary care doctor's notes lab work she is planning on getting echocardiogram.  She was advised to call the office in the morning let them know she came to the ED for the CTA.  Her daughter can proceed with the echocardiogram to further evaluate her shortness of breath.  Final Clinical Impressions(s) / ED Diagnoses   Final diagnoses:  Shortness of breath    ED Discharge Orders    None      Plan discharge  Rolland Porter, MD, Barbette Or, MD 01/15/18 [phone]

## 2018-01-15 NOTE — Discharge Instructions (Signed)
Please call your doctor's office to let her know about your CT results. She is planning on getting an echocardiogram of your heart which will also give information about your shortness of breath.

## 2018-01-15 NOTE — ED Triage Notes (Signed)
Pt states she went to her doctor and they called her after office hours and told her she had a positive d dimer and needed to come to the hospital  Pt states she was going to wait til morning but when she laid down tonight she had some shortness of breath and some pain between her shoulder blades  Pt also reported that she got short of breath walking in  Pt denies pain at this time

## 2018-01-16 ENCOUNTER — Ambulatory Visit: Payer: 59 | Admitting: Family Medicine

## 2018-01-16 ENCOUNTER — Other Ambulatory Visit: Payer: Self-pay

## 2018-01-16 ENCOUNTER — Ambulatory Visit (INDEPENDENT_AMBULATORY_CARE_PROVIDER_SITE_OTHER): Payer: 59

## 2018-01-16 ENCOUNTER — Encounter: Payer: Self-pay | Admitting: Family Medicine

## 2018-01-16 VITALS — BP 130/85 | HR 112 | Temp 97.6°F | Resp 18 | Ht 66.0 in | Wt 310.2 lb

## 2018-01-16 DIAGNOSIS — G4733 Obstructive sleep apnea (adult) (pediatric): Secondary | ICD-10-CM | POA: Diagnosis not present

## 2018-01-16 DIAGNOSIS — R600 Localized edema: Secondary | ICD-10-CM

## 2018-01-16 DIAGNOSIS — R062 Wheezing: Secondary | ICD-10-CM

## 2018-01-16 DIAGNOSIS — I890 Lymphedema, not elsewhere classified: Secondary | ICD-10-CM | POA: Diagnosis not present

## 2018-01-16 DIAGNOSIS — I10 Essential (primary) hypertension: Secondary | ICD-10-CM | POA: Diagnosis not present

## 2018-01-16 DIAGNOSIS — E1165 Type 2 diabetes mellitus with hyperglycemia: Secondary | ICD-10-CM | POA: Diagnosis not present

## 2018-01-16 DIAGNOSIS — R0989 Other specified symptoms and signs involving the circulatory and respiratory systems: Secondary | ICD-10-CM

## 2018-01-16 DIAGNOSIS — L237 Allergic contact dermatitis due to plants, except food: Secondary | ICD-10-CM | POA: Diagnosis not present

## 2018-01-16 DIAGNOSIS — R7989 Other specified abnormal findings of blood chemistry: Secondary | ICD-10-CM

## 2018-01-16 LAB — POCT GLYCOSYLATED HEMOGLOBIN (HGB A1C): Hemoglobin A1C: 14 % — AB (ref 4.0–5.6)

## 2018-01-16 MED ORDER — CEPHALEXIN 500 MG PO CAPS: 500.0000 mg | ORAL_CAPSULE | Freq: Four times a day (QID) | ORAL | 0 refills | Status: AC

## 2018-01-16 MED ORDER — TRIAMCINOLONE ACETONIDE 0.1 % EX CREA: 1.0000 "application " | TOPICAL_CREAM | Freq: Two times a day (BID) | CUTANEOUS | 0 refills | Status: DC

## 2018-01-16 MED FILL — CEPHALEXIN 500 MG CAPSULE: 500 | 7 days supply | Qty: 28 | Fill #0

## 2018-01-16 MED FILL — TRIAMCINOLONE 0.1% CREAM: 0.1 | 15 days supply | Qty: 30 | Fill #0

## 2018-01-16 NOTE — Patient Instructions (Signed)
     IF you received an x-ray today, you will receive an invoice from [facility]. Please contact [facility] at [phone] with questions or concerns regarding your invoice.   IF you received labwork today, you will receive an invoice from [facility]. Please contact [facility] at [phone] with questions or concerns regarding your invoice.   Our billing staff will not be able to assist you with questions regarding bills from these companies.  You will be contacted with the lab results as soon as they are available. The fastest way to get your results is to activate your My Chart account. Instructions are located on the last page of this paperwork. If you have not heard from us regarding the results in 2 weeks, please contact this office.     

## 2018-01-16 NOTE — Progress Notes (Signed)
Chief Complaint  Patient presents with  . diabetic check    blood sugar today 207  . other concerns    weeping cellulitis in stomach, edema, positive d dimer and sob and pt wants to quit job due to sickness and health issues    HPI   Elevated D-DIMER Pt reports that she was sent to the ER for a high D-dimer to rule out DVT She states that she had a negative scan CT angio for PE but she is getting an Echo  She reports that she was having some difficulty breathing  Diabetes Mellitus She reports that her fasting blood glucose is typically 200-210 She states that she knows that her pancreas is just not working  She checks her sugars regularly Lab Results  Component Value Date   HGBA1C 14.0 (A) 01/16/2018   Anxiety and Depression She reports that she feels so overwhelmed Her husband has been to the cath lab 2 times and had a very complex health history including heart failure She states that she feels stressed and at work she is so distracted She has been rocking back and forth in her seat at work Employee assistance is working with her on her anxiety  She reports that she often feels sick at work  Depression screen [facility] 2/9 01/16/2018 10/08/2017 10/03/2017 05/07/2017 01/18/2017  Decreased Interest 3 0 0 0 0  Down, Depressed, Hopeless 1 0 0 0 0  PHQ - 2 Score 4 0 0 0 0  Altered sleeping 3 - - - -  Tired, decreased energy 1 - - - -  Change in appetite 1 - - - -  Feeling bad or failure about yourself  0 - - - -  Trouble concentrating 0 - - - -  Moving slowly or fidgety/restless 3 - - - -  Suicidal thoughts 0 - - - -  PHQ-9 Score 12 - - - -  Difficult doing work/chores Somewhat difficult - - - -     Lymphedema Fluid retention   She reports that her abdominal pannus is leaking clear fluid She reports that in the past she had an  She reports that she has an iching burning patch of poison ivy She has been using calamine lotion  Past Medical History:  Diagnosis Date  . Allergy    . Bilateral shoulder pain   . Borderline hypertension   . Bronchitis   . Diabetes mellitus    Diagnosed in 2000  . Hyperlipidemia   . Obesity   . Sleep apnea     Current Outpatient Medications  Medication Sig Dispense Refill  . albuterol (PROAIR HFA) 108 (90 Base) MCG/ACT inhaler Inhale 2 puffs into the lungs every 6 (six) hours as needed. wheezing 18 g 2  . aspirin 325 MG tablet Take 650 mg by mouth every 4 (four) hours as needed (PAIN).    Marland Kitchen bismuth subsalicylate (PEPTO BISMOL) 262 MG chewable tablet Chew 524 mg by mouth as needed. Reported on 09/21/2015    . budesonide-formoterol (SYMBICORT) 80-4.5 MCG/ACT inhaler Inhale 2 puffs into the lungs 2 (two) times daily. 1 Inhaler 5  . cetirizine (ZYRTEC) 10 MG tablet Take 10 mg by mouth daily.    . diclofenac sodium (VOLTAREN) 1 % GEL Apply 2 g topically 4 (four) times daily. 100 g 1  . metFORMIN (GLUCOPHAGE) 1000 MG tablet Take 1 tablet (1,000 mg total) by mouth 2 (two) times daily with a meal. 180 tablet 2  . Multiple Vitamin (MULITIVITAMIN WITH MINERALS)  TABS Take 1 tablet by mouth daily with breakfast.    . naproxen sodium (ANAPROX) 220 MG tablet Take 220 mg by mouth 2 (two) times daily as needed (pain).     Marland Kitchen UNABLE TO FIND Compression Stocking QD - 15-20 mmh    . cephALEXin (KEFLEX) 500 MG capsule Take 1 capsule (500 mg total) by mouth 4 (four) times daily for 7 days. 28 capsule 0  . furosemide (LASIX) 40 MG tablet Take 1 tablet (40 mg total) by mouth daily. 90 tablet 3  . sacubitril-valsartan (ENTRESTO) 24-26 MG Take 1 tablet by mouth 2 (two) times daily. 60 tablet 11  . triamcinolone cream (KENALOG) 0.1 % Apply 1 application topically 2 (two) times daily. 30 g 0   No current facility-administered medications for this visit.     Allergies:  Allergies  Allergen Reactions  . Exenatide     Flu-like symptoms  . Lisinopril Other (See Comments)    cramping  . Other Diarrhea    Kale=food  . Statins Other (See Comments)    Leg  cramps    Past Surgical History:  Procedure Laterality Date  . BREATH TEK H PYLORI  09/18/2011   Procedure: BREATH TEK H PYLORI;  Surgeon: Shann Medal, MD;  Location: [facility];  Service: General;  Laterality: N/A;  . CESAREAN SECTION     x 3  . LAPAROSCOPIC APPENDECTOMY N/A 10/03/2017   Procedure: APPENDECTOMY LAPAROSCOPIC;  Surgeon: Leighton Ruff, MD;  Location: [facility];  Service: General;  Laterality: N/A;    Social History   Socioeconomic History  . Marital status: Married    Spouse name: Not on file  . Number of children: Not on file  . Years of education: Not on file  . Highest education level: Not on file  Occupational History  . Not on file  Social Needs  . Financial resource strain: Not on file  . Food insecurity:    Worry: Not on file    Inability: Not on file  . Transportation needs:    Medical: Not on file    Non-medical: Not on file  Tobacco Use  . Smoking status: Former Smoker    Packs/day: 1.00    Years: 15.00    Pack years: 15.00    Types: Cigarettes    Last attempt to quit: 01/28/1990    Years since quitting: 27.9  . Smokeless tobacco: Never Used  Substance and Sexual Activity  . Alcohol use: No  . Drug use: No  . Sexual activity: Not Currently  Lifestyle  . Physical activity:    Days per week: Not on file    Minutes per session: Not on file  . Stress: Not on file  Relationships  . Social connections:    Talks on phone: Not on file    Gets together: Not on file    Attends religious service: Not on file    Active member of club or organization: Not on file    Attends meetings of clubs or organizations: Not on file    Relationship status: Not on file  Other Topics Concern  . Not on file  Social History Narrative  . Not on file    Family History  Problem Relation Age of Onset  . Alcohol abuse Mother   . Arthritis Mother   . Diabetes Mother   . Sleep apnea Mother   . Alcohol abuse Father   . Heart disease Father   . Sleep apnea  Sister  ROS Review of Systems See HPI Constitution: No fevers or chills No malaise No diaphoresis Skin: No rash or itching Eyes: no blurry vision, no double vision GU: no dysuria or hematuria Neuro: no dizziness or headaches all others reviewed and negative   Objective: Vitals:   01/16/18 1343  BP: 130/85  Pulse: (!) 112  Resp: 18  Temp: 97.6 F (36.4 C)  TempSrc: Oral  SpO2: 95%  Weight: (!) 310 lb 3.2 oz (140.7 kg)  Height: 5\' 6"  (1.676 m)    Physical Exam  Constitutional: She is oriented to person, place, and time. She appears well-developed and well-nourished.  HENT:  Head: Normocephalic and atraumatic.  Eyes: Conjunctivae and EOM are normal.  Cardiovascular: Normal rate, regular rhythm and normal heart sounds.  No murmur heard. Pulmonary/Chest: Effort normal. No respiratory distress.  Bibasilar crackles noted with ausculation of the lungs with auditory wheezing when patient talks  Neurological: She is oriented to person, place, and time.  Skin: Skin is warm. Capillary refill takes less than 2 seconds. Rash noted.  Skin has the appearance of orange peel, with squeezing there is clear fluid from the pores, no erythema, no exudate She also has flat erythematous rash on the ankle in concentric pattern  Psychiatric: Her behavior is normal. Judgment and thought content normal.  Tearful affect      CLINICAL DATA:  Abnormal breath sounds  EXAM: CHEST - 2 VIEW  COMPARISON:  Chest radiograph Jan 14, 2018 and chest CT Jan 15, 2018  FINDINGS: There is no edema or consolidation. Heart is mildly enlarged with pulmonary vascularity within normal limits. No adenopathy. There is degenerative change in the thoracic spine.  IMPRESSION: Stable cardiac prominence.  No edema or consolidation.   Electronically Signed   By: Lowella Grip III M.D.   On: 01/16/2018 14:58     Assessment and Plan Terri Heath was seen today for diabetic check and other  concerns.  Diagnoses and all orders for this visit:  Wheezing Elevated brain natriuretic peptide (BNP) level Bibasilar crackles -     DG Chest 2 View; Future cxr unchanged from 01/16/18 No effusion or edema Discussed with patient that although she is visibly fluid overloaded with pitting edema, cough with wheeze and crackles on pulmonary auscultation Discussed that her ECHO will give more information Gave ER precautions  Essential hypertension-  Advised pt to continue her current meds Will plan on seeing what the echo shows to see if Lasix is necessary  Uncontrolled type 2 diabetes mellitus with hyperglycemia, without long-term current use of insulin ([place]) -  Her a1c is 14% today She is only on metformin  Previous a1c was 11.4% -     POCT glycosylated hemoglobin (Hb A1C)  Edema of skin Lymphedema- would give keflex for prophylaxis against infection  Continue monitoring  -     WOUND CULTURE       Marcell Chavarin A Zaray Gatchel

## 2018-01-17 ENCOUNTER — Ambulatory Visit (HOSPITAL_COMMUNITY): Payer: 59 | Attending: Cardiology

## 2018-01-17 ENCOUNTER — Other Ambulatory Visit: Payer: Self-pay

## 2018-01-17 ENCOUNTER — Encounter: Payer: Self-pay | Admitting: Interventional Cardiology

## 2018-01-17 ENCOUNTER — Ambulatory Visit (INDEPENDENT_AMBULATORY_CARE_PROVIDER_SITE_OTHER): Payer: 59 | Admitting: Interventional Cardiology

## 2018-01-17 VITALS — BP 126/56 | HR 106 | Ht 66.0 in | Wt 315.8 lb

## 2018-01-17 DIAGNOSIS — E119 Type 2 diabetes mellitus without complications: Secondary | ICD-10-CM | POA: Insufficient documentation

## 2018-01-17 DIAGNOSIS — I10 Essential (primary) hypertension: Secondary | ICD-10-CM | POA: Diagnosis not present

## 2018-01-17 DIAGNOSIS — R0600 Dyspnea, unspecified: Secondary | ICD-10-CM | POA: Diagnosis not present

## 2018-01-17 DIAGNOSIS — E1159 Type 2 diabetes mellitus with other circulatory complications: Secondary | ICD-10-CM | POA: Diagnosis not present

## 2018-01-17 DIAGNOSIS — E785 Hyperlipidemia, unspecified: Secondary | ICD-10-CM | POA: Diagnosis not present

## 2018-01-17 DIAGNOSIS — R7989 Other specified abnormal findings of blood chemistry: Secondary | ICD-10-CM | POA: Diagnosis not present

## 2018-01-17 DIAGNOSIS — I272 Pulmonary hypertension, unspecified: Secondary | ICD-10-CM | POA: Diagnosis not present

## 2018-01-17 DIAGNOSIS — I5021 Acute systolic (congestive) heart failure: Secondary | ICD-10-CM

## 2018-01-17 DIAGNOSIS — I081 Rheumatic disorders of both mitral and tricuspid valves: Secondary | ICD-10-CM | POA: Insufficient documentation

## 2018-01-17 MED ORDER — SACUBITRIL-VALSARTAN 24-26 MG PO TABS: 1.0000 | ORAL_TABLET | Freq: Two times a day (BID) | ORAL | 11 refills | Status: DC

## 2018-01-17 MED ORDER — FUROSEMIDE 40 MG PO TABS: 40.0000 mg | ORAL_TABLET | Freq: Every day | ORAL | 3 refills | Status: DC

## 2018-01-17 MED ORDER — PERFLUTREN LIPID MICROSPHERE
1.0000 mL | INTRAVENOUS | Status: AC | PRN
Start: 2018-01-17 — End: 2018-01-17
  Administered 2018-01-17: 1 mL via INTRAVENOUS

## 2018-01-17 MED FILL — FUROSEMIDE 40 MG TAB: 40 | 90 days supply | Qty: 90 | Fill #0

## 2018-01-17 NOTE — Patient Instructions (Addendum)
Medication Instructions:  Your physician has recommended you make the following change in your medication:   1. START: furosemide (lasix) 40 mg tablet: Take 1 tablet (40 mg total) ONCE A DAY  2. START: sacubitril-valsartan (Entresto) 24-26 mg tablet: Take 1 tablet TWICE A DAY   Labwork: Your physician recommends that you return for lab work on 01/24/18 for BMET, TSH, Iron, Serum and Urine Protein   Testing/Procedures: None ordered  Follow-Up: Your physician recommends that you return for follow-up on 02/05/18 at 2:40 PM with Dr. Irish Lack   Any Other Special Instructions Will Be Listed Below (If Applicable).     If you need a refill on your cardiac medications before your next appointment, please call your pharmacy.

## 2018-01-17 NOTE — Progress Notes (Signed)
Cardiology Office Note   Date:  01/17/2018   ID:  MALLORI ARAQUE, DOB April 24, 1956, MRN [id number]  PCP:  [name], MD    No chief complaint on file.  Acute systolic heart failure  Wt Readings from Last 3 Encounters:  01/17/18 (!) 315 lb 12.8 oz (143.2 kg)  01/16/18 (!) 310 lb 3.2 oz (140.7 kg)  01/14/18 (!) 313 lb (142 kg)       History of Present Illness: Terri Heath is a 62 y.o. female who is being seen today for the evaluation of SHOB at the request of Delia Chimes A, MD.  She has a h/o OSA, on CPAP.    She had an appendectomy in early 2019.  She had fluid retention after this.  She has had DOE as well.  CT was negative for PE.  SHe had an echo today and this showed decreasedLV function.  Her job is sedentary.  She works at [employer] as a Scientist, water quality.  In her family, her father had vascular disease including CABG.  Mother's side - Mom with AFib.  Siblings are healthy.  Past Medical History:  Diagnosis Date  . Allergy   . Bilateral shoulder pain   . Borderline hypertension   . Bronchitis   . Diabetes mellitus    Diagnosed in 2000  . Hyperlipidemia   . Obesity   . Sleep apnea     Past Surgical History:  Procedure Laterality Date  . BREATH TEK H PYLORI  09/18/2011   Procedure: BREATH TEK H PYLORI;  Surgeon: Shann Medal, MD;  Location: [facility];  Service: General;  Laterality: N/A;  . CESAREAN SECTION     x 3  . LAPAROSCOPIC APPENDECTOMY N/A 10/03/2017   Procedure: APPENDECTOMY LAPAROSCOPIC;  Surgeon: Leighton Ruff, MD;  Location: [facility];  Service: General;  Laterality: N/A;     Current Outpatient Medications  Medication Sig Dispense Refill  . albuterol (PROAIR HFA) 108 (90 Base) MCG/ACT inhaler Inhale 2 puffs into the lungs every 6 (six) hours as needed. wheezing 18 g 2  . aspirin 325 MG tablet Take 650 mg by mouth every 4 (four) hours as needed (PAIN).    Marland Kitchen bismuth subsalicylate (PEPTO BISMOL) 262 MG chewable tablet Chew 524 mg  by mouth as needed. Reported on 09/21/2015    . budesonide-formoterol (SYMBICORT) 80-4.5 MCG/ACT inhaler Inhale 2 puffs into the lungs 2 (two) times daily. 1 Inhaler 5  . cephALEXin (KEFLEX) 500 MG capsule Take 1 capsule (500 mg total) by mouth 4 (four) times daily for 7 days. 28 capsule 0  . cetirizine (ZYRTEC) 10 MG tablet Take 10 mg by mouth daily.    . diclofenac sodium (VOLTAREN) 1 % GEL Apply 2 g topically 4 (four) times daily. 100 g 1  . metFORMIN (GLUCOPHAGE) 1000 MG tablet Take 1 tablet (1,000 mg total) by mouth 2 (two) times daily with a meal. 180 tablet 2  . Multiple Vitamin (MULITIVITAMIN WITH MINERALS) TABS Take 1 tablet by mouth daily with breakfast.    . naproxen sodium (ANAPROX) 220 MG tablet Take 220 mg by mouth 2 (two) times daily as needed (pain).     . triamcinolone cream (KENALOG) 0.1 % Apply 1 application topically 2 (two) times daily. 30 g 0  . UNABLE TO FIND Compression Stocking QD - 15-20 mmh     No current facility-administered medications for this visit.    Facility-Administered Medications Ordered in Other Visits  Medication Dose Route Frequency  Provider Last Rate Last Dose  . perflutren lipid microspheres (DEFINITY) IV suspension  1-10 mL Intravenous PRN Lelon Perla, MD   1 mL at 01/17/18 5170    Allergies:   Exenatide; Lisinopril; Other; and Statins    Social History:  The patient  reports that she quit smoking about 27 years ago. Her smoking use included cigarettes. She has a 15.00 pack-year smoking history. She has never used smokeless tobacco. She reports that she does not drink alcohol or use drugs.   Family History:  The patient's family history includes Alcohol abuse in her father and mother; Arthritis in her mother; Diabetes in her mother; Heart disease in her father; Sleep apnea in her mother and sister.    ROS:  Please see the history of present illness.   Otherwise, review of systems are positive for dyspnea on exertion.   All other systems are  reviewed and negative.    PHYSICAL EXAM: VS:  BP (!) 126/56   Pulse (!) 106   Ht 5\' 6"  (1.676 m)   Wt (!) 315 lb 12.8 oz (143.2 kg)   BMI 50.97 kg/m  , BMI Body mass index is 50.97 kg/m. GEN: Well nourished, well developed, in no acute distress  HEENT: normal  Neck: no JVD, carotid bruits, or masses Cardiac: Tachycardic; no murmurs, rubs, or gallops,; trace pretibial edema  Respiratory:  clear to auscultation bilaterally, normal work of breathing GI: soft, nontender, nondistended, + BS, obese MS: no deformity or atrophy  Skin: warm and dry, no rash Neuro:  Strength and sensation are intact Psych: euthymic mood, full affect   EKG:   The ekg ordered today demonstrates sinus tachycardia, low voltage   Recent Labs: 10/23/2017: ALT 16 01/14/2018: BUN 12; Creatinine, Ser 0.79; Hemoglobin 14.0; Platelets 232.0; Potassium 5.0; Pro B Natriuretic peptide (BNP) 308.0; Sodium 136   Lipid Panel    Component Value Date/Time   CHOL 249 (H) 03/14/2016 1228   TRIG 142 03/14/2016 1228   HDL 61 03/14/2016 1228   CHOLHDL 4.1 03/14/2016 1228   VLDL 28 03/14/2016 1228   LDLCALC 160 (H) 03/14/2016 1228     Other studies Reviewed: Additional studies/ records that were reviewed today with results demonstrating: Preliminary echo report shows severely decreased left ventricular systolic function.   ASSESSMENT AND PLAN:  1. Acute systolic heart failure: This is the likely cause of her dyspnea on exertion.  Symptoms have slightly improved over the past week.  Start Lasix 40 mg daily.  Start Entresto 24/26.  Check electrolytes in 1 week.  Check TSH, iron studies and SPEP/UPEP as well.  If no nonischemic etiology is found, she will likely need cardiac cath.  Significant family history on her father's side of vascular disease.  Would like to wait for her to be more euvolemic.  We discussed admission to the hospital but she feels better now and does not feel that she needs to be admitted. 2. Obesity:  This will be a long-term issue for her.  Weight loss will certainly help. 3. Diabetes: Hemoglobin A1c 14 in May 2019.  She will need lipid-lowering therapy eventually as well with LDL target less than 100 and high potency statin.  We will start this at a later time since we are already starting some other medications.   Current medicines are reviewed at length with the patient today.  The patient concerns regarding her medicines were addressed.  The following changes have been made:  As above  Labs/ tests ordered  today include:  No orders of the defined types were placed in this encounter.   Recommend 150 minutes/week of aerobic exercise Low fat, low carb, high fiber diet recommended  Disposition:   FU in 2 weeks   Signed, Larae Grooms, MD  01/17/2018 10:19 AM    [facility] [address], [place], [place]  [address] Phone: [phone]; Fax: [phone]

## 2018-01-18 LAB — WOUND CULTURE: Organism ID, Bacteria: NONE SEEN

## 2018-01-21 ENCOUNTER — Encounter: Payer: Self-pay | Admitting: Family Medicine

## 2018-01-22 ENCOUNTER — Telehealth: Payer: Self-pay | Admitting: Family Medicine

## 2018-01-22 ENCOUNTER — Encounter: Payer: Self-pay | Admitting: *Deleted

## 2018-01-22 NOTE — Telephone Encounter (Signed)
Patient needs FMLA forms completed at her next OV on 01/24/18 with Dr Nolon Rod, I have the forms on my desk and will complete once the OV is over.

## 2018-01-24 ENCOUNTER — Other Ambulatory Visit: Payer: 59 | Admitting: *Deleted

## 2018-01-24 ENCOUNTER — Ambulatory Visit: Payer: 59 | Admitting: Family Medicine

## 2018-01-24 ENCOUNTER — Other Ambulatory Visit: Payer: Self-pay

## 2018-01-24 ENCOUNTER — Encounter: Payer: Self-pay | Admitting: Family Medicine

## 2018-01-24 VITALS — BP 124/84 | HR 111 | Temp 98.0°F | Resp 18 | Ht 66.0 in | Wt 293.4 lb

## 2018-01-24 DIAGNOSIS — I5021 Acute systolic (congestive) heart failure: Secondary | ICD-10-CM

## 2018-01-24 DIAGNOSIS — E113293 Type 2 diabetes mellitus with mild nonproliferative diabetic retinopathy without macular edema, bilateral: Secondary | ICD-10-CM | POA: Diagnosis not present

## 2018-01-24 DIAGNOSIS — E1165 Type 2 diabetes mellitus with hyperglycemia: Secondary | ICD-10-CM | POA: Diagnosis not present

## 2018-01-24 MED ORDER — TRIAMCINOLONE ACETONIDE 0.1 % EX CREA: 1.0000 "application " | TOPICAL_CREAM | Freq: Two times a day (BID) | CUTANEOUS | 0 refills | Status: DC

## 2018-01-24 NOTE — Patient Instructions (Signed)
     IF you received an x-ray today, you will receive an invoice from [facility]. Please contact [facility] at [phone] with questions or concerns regarding your invoice.   IF you received labwork today, you will receive an invoice from [facility]. Please contact [facility] at [phone] with questions or concerns regarding your invoice.   Our billing staff will not be able to assist you with questions regarding bills from these companies.  You will be contacted with the lab results as soon as they are available. The fastest way to get your results is to activate your My Chart account. Instructions are located on the last page of this paperwork. If you have not heard from us regarding the results in 2 weeks, please contact this office.     

## 2018-01-24 NOTE — Progress Notes (Signed)
Chief Complaint  Patient presents with  . echocardiogram f/u and fmla paperwork    HPI  Pt needs her FMLA form completed to allow time for appts She was recently diagnosed with heart failure and is being worked up  Her uncontrolled diabetes is still uncontrolled and she reports that    The 10-year ASCVD risk score Terri Heath DC Terri Heath., et al., 2013) is: 6.3%   Values used to calculate the score:     Age: 62 years     Sex: Female     Is Non-Hispanic African American: No     Diabetic: Yes     Tobacco smoker: No     Systolic Blood Pressure: 762 mmHg     Is BP treated: Yes     HDL Cholesterol: 61 mg/dL     Total Cholesterol: 249 mg/dL  4 review of systems  Past Medical History:  Diagnosis Date  . Allergy   . Bilateral shoulder pain   . Borderline hypertension   . Bronchitis   . Diabetes mellitus    Diagnosed in 2000  . Hyperlipidemia   . Obesity   . Sleep apnea     Current Outpatient Medications  Medication Sig Dispense Refill  . albuterol (PROAIR HFA) 108 (90 Base) MCG/ACT inhaler Inhale 2 puffs into the lungs every 6 (six) hours as needed. wheezing (Patient taking differently: Inhale 2 puffs into the lungs every 6 (six) hours as needed for wheezing or shortness of breath. ) 18 g 2  . aspirin 325 MG tablet Take 325 mg by mouth daily.     Marland Kitchen bismuth subsalicylate (PEPTO BISMOL) 262 MG chewable tablet Chew 524 mg by mouth as needed for indigestion or diarrhea or loose stools.     . budesonide-formoterol (SYMBICORT) 80-4.5 MCG/ACT inhaler Inhale 2 puffs into the lungs 2 (two) times daily. 1 Inhaler 5  . cetirizine (ZYRTEC) 10 MG tablet Take 10 mg by mouth daily.    . diclofenac sodium (VOLTAREN) 1 % GEL Apply 2 g topically 4 (four) times daily. (Patient taking differently: Apply 2 g topically 4 (four) times daily as needed (for pain). ) 100 g 1  . furosemide (LASIX) 40 MG tablet Take 1 tablet (40 mg total) by mouth daily. 90 tablet 3  . Multiple Vitamin (MULITIVITAMIN WITH  MINERALS) TABS Take 1 tablet by mouth daily with breakfast.    . naproxen sodium (ANAPROX) 220 MG tablet Take 220 mg by mouth 2 (two) times daily as needed (for pain or headache).     . sacubitril-valsartan (ENTRESTO) 24-26 MG Take 1 tablet by mouth 2 (two) times daily. 60 tablet 11  . triamcinolone cream (KENALOG) 0.1 % Apply 1 application topically 2 (two) times daily. 30 g 0  . fluticasone (FLONASE) 50 MCG/ACT nasal spray Place 2 sprays into both nostrils daily as needed for allergies or rhinitis.    Marland Kitchen levothyroxine (SYNTHROID) 50 MCG tablet Take 1 tablet (50 mcg total) by mouth daily before breakfast. 90 tablet 3  . magnesium oxide (MAG-OX) 400 MG tablet Take 400 mg by mouth at bedtime as needed (for cramps).    . metFORMIN (GLUCOPHAGE) 1000 MG tablet Take 1 tablet (1,000 mg total) by mouth 2 (two) times daily with a meal. 180 tablet 2   No current facility-administered medications for this visit.     Allergies:  Allergies  Allergen Reactions  . Adhesive [Tape] Rash  . Exenatide Other (See Comments)    Flu-like symptoms  . Lisinopril Other (See Comments)  cramping  . Other Diarrhea and Other (See Comments)    Kale=food  . Statins Other (See Comments)    Leg cramps    Past Surgical History:  Procedure Laterality Date  . BREATH TEK H PYLORI  09/18/2011   Procedure: BREATH TEK H PYLORI;  Surgeon: Shann Medal, MD;  Location: [facility];  Service: General;  Laterality: N/A;  . CESAREAN SECTION     x 3  . LAPAROSCOPIC APPENDECTOMY N/A 10/03/2017   Procedure: APPENDECTOMY LAPAROSCOPIC;  Surgeon: Leighton Ruff, MD;  Location: [facility];  Service: General;  Laterality: N/A;  . RIGHT/LEFT HEART CATH AND CORONARY ANGIOGRAPHY N/A 02/12/2018   Procedure: RIGHT/LEFT HEART CATH AND CORONARY ANGIOGRAPHY;  Surgeon: Jettie Booze, MD;  Location: [facility];  Service: Cardiovascular;  Laterality: N/A;    Social History   Socioeconomic History  . Marital status: Married     Spouse name: Not on file  . Number of children: Not on file  . Years of education: Not on file  . Highest education level: Not on file  Occupational History  . Not on file  Social Needs  . Financial resource strain: Not on file  . Food insecurity:    Worry: Not on file    Inability: Not on file  . Transportation needs:    Medical: Not on file    Non-medical: Not on file  Tobacco Use  . Smoking status: Former Smoker    Packs/day: 1.00    Years: 15.00    Pack years: 15.00    Types: Cigarettes    Last attempt to quit: 01/28/1990    Years since quitting: 28.0  . Smokeless tobacco: Never Used  Substance and Sexual Activity  . Alcohol use: No  . Drug use: No  . Sexual activity: Not Currently  Lifestyle  . Physical activity:    Days per week: Not on file    Minutes per session: Not on file  . Stress: Not on file  Relationships  . Social connections:    Talks on phone: Not on file    Gets together: Not on file    Attends religious service: Not on file    Active member of club or organization: Not on file    Attends meetings of clubs or organizations: Not on file    Relationship status: Not on file  Other Topics Concern  . Not on file  Social History Narrative  . Not on file    Family History  Problem Relation Age of Onset  . Alcohol abuse Mother   . Arthritis Mother   . Diabetes Mother   . Sleep apnea Mother   . Alcohol abuse Father   . Heart disease Father   . Sleep apnea Sister      ROS Review of Systems See HPI Constitution: No fevers or chills No malaise No diaphoresis Skin: No rash or itching Eyes: no blurry vision, no double vision GU: no dysuria or hematuria Neuro: no dizziness or headaches * all others reviewed and negative   Objective: Vitals:   01/24/18 1133  BP: 124/84  Pulse: (!) 111  Resp: 18  Temp: 98 F (36.7 C)  TempSrc: Oral  SpO2: 95%  Weight: 293 lb 6.4 oz (133.1 kg)  Height: 5\' 6"  (1.676 m)   Wt Readings from Last 3  Encounters:  02/14/18 298 lb (135.2 kg)  02/12/18 294 lb (133.4 kg)  02/05/18 293 lb (132.9 kg)     Physical Exam  Constitutional: She is oriented to person, place, and time. She appears well-developed and well-nourished.  HENT:  Head: Normocephalic and atraumatic.  Cardiovascular: Normal rate, regular rhythm and normal heart sounds.  No murmur heard. Pulmonary/Chest: Effort normal and breath sounds normal. No stridor. No respiratory distress. She has no wheezes.  Musculoskeletal: Normal range of motion. She exhibits no edema.  Neurological: She is alert and oriented to person, place, and time.  Psychiatric: She has a normal mood and affect. Her behavior is normal. Judgment and thought content normal.     Physical Exam  Constitutional: She is oriented to person, place, and time. She appears well-developed and well-nourished.  HENT:  Head: Normocephalic and atraumatic.  Eyes: Conjunctivae and EOM are normal.  Cardiovascular: Normal rate, regular rhythm and normal heart sounds.   Pulmonary/Chest: Effort normal and breath sounds normal. No respiratory distress. She has no wheezes.  Abdominal: Normal appearance and bowel sounds are normal. There is no tenderness. There is no CVA tenderness.  Neurological: She is alert and oriented to person, place, and time.      Study Conclusions  - Left ventricle: The cavity size was mildly dilated. Wall   thickness was normal. Systolic function was severely reduced. The   estimated ejection fraction was in the range of 25% to 30%.   Diffuse hypokinesis. Doppler parameters are consistent with   restrictive physiology, indicative of decreased left ventricular   diastolic compliance and/or increased left atrial pressure.   Doppler parameters are consistent with high ventricular filling   pressure. - Mitral valve: There was moderate regurgitation. - Left atrium: The atrium was mildly dilated. - Right ventricle: The cavity size was moderately  dilated. - Right atrium: The atrium was mildly dilated. - Pulmonary arteries: Systolic pressure was moderately increased.   PA peak pressure: 50 mm Hg (S).  Impressions:  - Definity used; global hypokinesis (worse in septum); overall   severely reduced LV systolic function; restrictive filling; 4   chamber enlargement; moderate MR; mild TR with moderate pulmonary   hypertension.    Assessment and Plan Nishtha was seen today for echocardiogram f/u and fmla paperwork.  Diagnoses and all orders for this visit:  Acute systolic heart failure ([place]) -     Ambulatory referral to diabetic education  Uncontrolled type 2 diabetes mellitus with hyperglycemia, without long-term current use of insulin ([place]) -     Ambulatory referral to diabetic education  Other orders -     triamcinolone cream (KENALOG) 0.1 %; Apply 1 application topically 2 (two) times daily.   Completed FMLA paperwork and discussed compliance Discuss her morbidity and mortality Advised close follow up This visit was largely to discuss her overall health  [place]

## 2018-01-28 ENCOUNTER — Telehealth: Payer: Self-pay

## 2018-01-28 DIAGNOSIS — Z79899 Other long term (current) drug therapy: Secondary | ICD-10-CM

## 2018-01-28 DIAGNOSIS — R7989 Other specified abnormal findings of blood chemistry: Secondary | ICD-10-CM

## 2018-01-28 LAB — PROTEIN ELECTROPHORESIS, URINE REFLEX
.: 0
Albumin ELP, Urine: 35 %
Alpha-1-Globulin, U: 5.8 %
Alpha-2-Globulin, U: 13.7 %
BETA GLOBULIN, U: 19.4 %
Gamma Globulin, U: 26 %
PROTEIN UR: 34.1 mg/dL

## 2018-01-28 LAB — IFE REFLEX, RANDOM URINE

## 2018-01-28 MED ORDER — LEVOTHYROXINE SODIUM 50 MCG PO TABS: 50.0000 ug | ORAL_TABLET | Freq: Every day | ORAL | 3 refills | Status: DC

## 2018-01-28 MED FILL — LEVOTHYROXINE 50 MCG TABLET: 50 | 90 days supply | Qty: 90 | Fill #0

## 2018-01-28 NOTE — Telephone Encounter (Signed)
Called and made patient aware of lab results. Made patient aware that kidney function and potassium were normal. Made patient aware that her iron levels were normal and that there was no evidence of hemochromatosis. Made patient aware that her TSH was elevated and that she will need to start synthroid 50 mcg QD and repeat TSH and check T4 in 6 weeks. Lab appointment made for 03/11/18 and sent Rx to preferred pharmacy. Made patient aware that we are still waiting on her SPEP and UPEP results and if they come back normal she may need a R&LHC to evaluate for CAD. Patient verbalized understanding and thanked me for the call. Results sent to patient's PCP Dr. Nolon Rod.

## 2018-01-28 NOTE — Telephone Encounter (Signed)
-----   Message from Jettie Booze, MD sent at 01/25/2018  4:59 PM EDT ----- No hemochromatosis.  She does appear hypothyroid.  Please forward to PMD.  Can start on Synthroid 50 mcg daily.  Recheck TSH and T4 in 6 weeks.  SHe will likely need right and left heart cath to eval for CAD.

## 2018-01-28 NOTE — Telephone Encounter (Signed)
Paperwork faxed on 01/28/18

## 2018-01-29 LAB — BASIC METABOLIC PANEL
BUN/Creatinine Ratio: 19 (ref 12–28)
BUN: 16 mg/dL (ref 8–27)
CHLORIDE: 97 mmol/L (ref 96–106)
CO2: 24 mmol/L (ref 20–29)
Calcium: 9.1 mg/dL (ref 8.7–10.3)
Creatinine, Ser: 0.86 mg/dL (ref 0.57–1.00)
GFR, EST AFRICAN AMERICAN: 85 mL/min/{1.73_m2} (ref >59)
GFR, EST NON AFRICAN AMERICAN: 74 mL/min/{1.73_m2} (ref >59)
Glucose: 266 mg/dL — ABNORMAL HIGH (ref 65–99)
POTASSIUM: 4.1 mmol/L (ref 3.5–5.2)
SODIUM: 139 mmol/L (ref 134–144)

## 2018-01-29 LAB — IRON,TIBC AND FERRITIN PANEL
FERRITIN: 78 ng/mL (ref 15–150)
IRON SATURATION: 20 % (ref 15–55)
Iron: 72 ug/dL (ref 27–159)
TIBC: 356 ug/dL (ref 250–450)
UIBC: 284 ug/dL (ref 131–425)

## 2018-01-29 LAB — TSH: TSH: 6.04 u[IU]/mL — AB (ref 0.450–4.500)

## 2018-01-29 LAB — IMMUNOFIXATION REFLEX, SERUM
IGA/IMMUNOGLOBULIN A, SERUM: 514 mg/dL — AB (ref 87–352)
IGM (IMMUNOGLOBULIN M), SRM: 706 mg/dL — AB (ref 26–217)
IgG (Immunoglobin G), Serum: 1723 mg/dL — ABNORMAL HIGH (ref 700–1600)

## 2018-01-29 LAB — PROTEIN ELECTROPHORESIS, SERUM, WITH REFLEX
A/G Ratio: 0.9 (ref 0.7–1.7)
ALBUMIN ELP: 3.5 g/dL (ref 2.9–4.4)
ALPHA 1: 0.2 g/dL (ref 0.0–0.4)
ALPHA 2: 0.6 g/dL (ref 0.4–1.0)
Beta: 1.1 g/dL (ref 0.7–1.3)
GAMMA GLOBULIN: 1.8 g/dL (ref 0.4–1.8)
Globulin, Total: 3.7 g/dL (ref 2.2–3.9)
INTERPRETATION(SEE BELOW): 0
M-Spike, %: 0.5 g/dL — ABNORMAL HIGH
Total Protein: 7.2 g/dL (ref 6.0–8.5)

## 2018-02-01 MED FILL — Perflutren Lipid Microsphere IV Susp 6.52 MG/ML: INTRAVENOUS | Qty: 2 | Status: AC

## 2018-02-05 ENCOUNTER — Encounter: Payer: Self-pay | Admitting: Interventional Cardiology

## 2018-02-05 ENCOUNTER — Ambulatory Visit: Payer: 59 | Admitting: Interventional Cardiology

## 2018-02-05 VITALS — BP 128/64 | HR 106 | Ht 66.0 in | Wt 293.0 lb

## 2018-02-05 DIAGNOSIS — I5022 Chronic systolic (congestive) heart failure: Secondary | ICD-10-CM

## 2018-02-05 DIAGNOSIS — E1159 Type 2 diabetes mellitus with other circulatory complications: Secondary | ICD-10-CM | POA: Diagnosis not present

## 2018-02-05 DIAGNOSIS — R7989 Other specified abnormal findings of blood chemistry: Secondary | ICD-10-CM

## 2018-02-05 DIAGNOSIS — D472 Monoclonal gammopathy: Secondary | ICD-10-CM | POA: Diagnosis not present

## 2018-02-05 NOTE — Progress Notes (Signed)
Cardiology Office Note   Date:  02/05/2018   ID:  Terri Heath, DOB [birth year], MRN [id number]  PCP:  [name], MD    No chief complaint on file.  Chronic systolic heart failure  Wt Readings from Last 3 Encounters:  02/05/18 293 lb (132.9 kg)  01/24/18 293 lb 6.4 oz (133.1 kg)  01/17/18 (!) 315 lb 12.8 oz (143.2 kg)       History of Present Illness: Terri Heath is a 62 y.o. female  Who was seen for [facility] in 5/19.  Echo revealed:  Left ventricle: The cavity size was mildly dilated. Wall   thickness was normal. Systolic function was severely reduced. The   estimated ejection fraction was in the range of 25% to 30%.   Diffuse hypokinesis. Doppler parameters are consistent with   restrictive physiology, indicative of decreased left ventricular   diastolic compliance and/or increased left atrial pressure.   Doppler parameters are consistent with high ventricular filling   pressure. - Mitral valve: There was moderate regurgitation. - Left atrium: The atrium was mildly dilated. - Right ventricle: The cavity size was moderately dilated. - Right atrium: The atrium was mildly dilated. - Pulmonary arteries: Systolic pressure was moderately increased.   PA peak pressure: 50 mm Hg (S).  Impressions:  - Definity used; global hypokinesis (worse in septum); overall   severely reduced LV systolic function; restrictive filling; 4   chamber enlargement; moderate MR; mild TR with moderate pulmonary   hypertension.   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  Normal iron studies.  SHe was started on Lasix and Entresto.    A1C was 14.  Family history with father who has severe vascular disease.    Past Medical History:  Diagnosis Date  . Allergy   . Bilateral shoulder pain   . Borderline hypertension   . Bronchitis   . Diabetes mellitus    Diagnosed in 2000  . Hyperlipidemia   . Obesity   . Sleep apnea     Past Surgical History:  Procedure  Laterality Date  . BREATH TEK H PYLORI  09/18/2011   Procedure: BREATH TEK H PYLORI;  Surgeon: Shann Medal, MD;  Location: [facility];  Service: General;  Laterality: N/A;  . CESAREAN SECTION     x 3  . LAPAROSCOPIC APPENDECTOMY N/A 10/03/2017   Procedure: APPENDECTOMY LAPAROSCOPIC;  Surgeon: Leighton Ruff, MD;  Location: [facility];  Service: General;  Laterality: N/A;     Current Outpatient Medications  Medication Sig Dispense Refill  . albuterol (PROAIR HFA) 108 (90 Base) MCG/ACT inhaler Inhale 2 puffs into the lungs every 6 (six) hours as needed. wheezing 18 g 2  . aspirin 325 MG tablet Take 650 mg by mouth every 4 (four) hours as needed (PAIN).    Marland Kitchen bismuth subsalicylate (PEPTO BISMOL) 262 MG chewable tablet Chew 524 mg by mouth as needed. Reported on 09/21/2015    . budesonide-formoterol (SYMBICORT) 80-4.5 MCG/ACT inhaler Inhale 2 puffs into the lungs 2 (two) times daily. 1 Inhaler 5  . cetirizine (ZYRTEC) 10 MG tablet Take 10 mg by mouth daily.    . diclofenac sodium (VOLTAREN) 1 % GEL Apply 2 g topically 4 (four) times daily. 100 g 1  . furosemide (LASIX) 40 MG tablet Take 1 tablet (40 mg total) by mouth daily. 90 tablet 3  . levothyroxine (SYNTHROID) 50 MCG tablet Take 1 tablet (50 mcg total) by mouth daily before breakfast. 90 tablet  3  . metFORMIN (GLUCOPHAGE) 1000 MG tablet Take 1 tablet (1,000 mg total) by mouth 2 (two) times daily with a meal. 180 tablet 2  . Multiple Vitamin (MULITIVITAMIN WITH MINERALS) TABS Take 1 tablet by mouth daily with breakfast.    . naproxen sodium (ANAPROX) 220 MG tablet Take 220 mg by mouth 2 (two) times daily as needed (pain).     . sacubitril-valsartan (ENTRESTO) 24-26 MG Take 1 tablet by mouth 2 (two) times daily. 60 tablet 11  . triamcinolone cream (KENALOG) 0.1 % Apply 1 application topically 2 (two) times daily. 30 g 0  . UNABLE TO FIND Compression Stocking QD - 15-20 mmh     No current facility-administered medications for this visit.      Allergies:   Exenatide; Lisinopril; Other; and Statins    Social History:  The patient  reports that she quit smoking about 28 years ago. Her smoking use included cigarettes. She has a 15.00 pack-year smoking history. She has never used smokeless tobacco. She reports that she does not drink alcohol or use drugs.   Family History:  The patient's family history includes Alcohol abuse in her father and mother; Arthritis in her mother; Diabetes in her mother; Heart disease in her father; Sleep apnea in her mother and sister.    ROS:  Please see the history of present illness.   Otherwise, review of systems are positive for difficulty controlling blood sugar.   All other systems are reviewed and negative.    PHYSICAL EXAM: VS:  BP 128/64   Pulse (!) 106   Ht 5\' 6"  (1.676 m)   Wt 293 lb (132.9 kg)   SpO2 94%   BMI 47.29 kg/m  , BMI Body mass index is 47.29 kg/m. GEN: Well nourished, well developed, in no acute distress  HEENT: normal  Neck: no JVD, carotid bruits, or masses Cardiac: RRR; no murmurs, rubs, or gallops,1+ bilateral LE edema ; 3+ right radial pulse Respiratory:  clear to auscultation bilaterally, normal work of breathing GI: soft, nontender, nondistended, + BS, obese MS: no deformity or atrophy  Skin: warm and dry, no rash Neuro:  Strength and sensation are intact Psych: euthymic mood, full affect    Recent Labs: 10/23/2017: ALT 16 01/14/2018: Hemoglobin 14.0; Platelets 232.0; Pro B Natriuretic peptide (BNP) 308.0 01/24/2018: BUN 16; Creatinine, Ser 0.86; Potassium 4.1; Sodium 139; TSH 6.040   Lipid Panel    Component Value Date/Time   CHOL 249 (H) 03/14/2016 1228   TRIG 142 03/14/2016 1228   HDL 61 03/14/2016 1228   CHOLHDL 4.1 03/14/2016 1228   VLDL 28 03/14/2016 1228   LDLCALC 160 (H) 03/14/2016 1228     Other studies Reviewed: Additional studies/ records that were reviewed today with results demonstrating: labs revewed.   ASSESSMENT AND  PLAN:  1. Chronic systolic heart failure: Uncelar etiology for low EF.  Multiple RF for CAD.  PLan for L/R [facility] with possible PCI.  Recent benefits of the procedure explained to the patient and she is willing to proceed.  All questions answered.  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 2. Hypothyroid: Started Synthroid. 3. 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 Lindi Adie.  4. DM: Poorly controlled.  She is working on her diet.  5. Morbid obesity: Working on weight loss.  She stated that it has been a while since she had been below 300 lbs, but she did this after the Lasix was  started.      Current medicines are reviewed at length with the patient today.  The patient concerns regarding her medicines were addressed.  The following changes have been made:  No change  Labs/ tests ordered today include:  No orders of the defined types were placed in this encounter.   Recommend 150 minutes/week of aerobic exercise Low fat, low carb, high fiber diet recommended  Disposition:   FU for cath   Signed, Larae Grooms, MD  02/05/2018 3:06 PM    [facility] [address], [place], [place]  [address] Phone: [phone]; Fax: [phone]

## 2018-02-05 NOTE — Patient Instructions (Addendum)
Medication Instructions:  Your physician recommends that you continue on your current medications as directed. Please refer to the Current Medication list given to you today.   Labwork: Your physician recommends that you return for lab work on 02/07/18 for BMET, CBC   Testing/Procedures: Your physician has requested that you have a cardiac catheterization on 02/12/18. Cardiac catheterization is used to diagnose and/or treat various heart conditions. Doctors may recommend this procedure for a number of different reasons. The most common reason is to evaluate chest pain. Chest pain can be a symptom of coronary artery disease (CAD), and cardiac catheterization can show whether plaque is narrowing or blocking your heart's arteries. This procedure is also used to evaluate the valves, as well as measure the blood flow and oxygen levels in different parts of your heart. For further information please visit [email or web address]. Please follow instruction sheet, as given.  Follow-Up: We will contact you for follow-up after your heart catheterization.  You have been referred to Dr. Nicholas Lose at the [facility] for monoclonal gammopathy of unclear significance.    Any Other Special Instructions Will Be Listed Below (If Applicable).    [facility] [address], [place] [address] [place] [address] Dept: [phone] Loc: [phone]  Terri Heath  02/05/2018  You are scheduled for a Right and Left Cardiac Catheterization on Tuesday, June 18 with Dr. Larae Grooms.  1. Please arrive at the [facility] (Main Entrance A) at [facility]: [address] [place], [place] [address] at 5:30 AM (two hours before your procedure to ensure your preparation). Free valet parking service is available.   Special note: Every effort is made to have your procedure done on time. Please understand that emergencies  sometimes delay scheduled procedures.  2. Diet: Do not eat or drink anything after midnight prior to your procedure except sips of water to take medications.  3. Labs: You will need to have blood drawn on Thursday, June 13 at [facility]. [address]  Open: 7:30am - 5pm    Phone: [phone]. You do not need to be fasting.  4. Medication instructions in preparation for your procedure:   1. DO NOT take your furosemide (lasix) the morning of your procedure   2. DO NOT take your metformin (glucophage) the morning of your procedure AND for 48 hours after your  procedure  On the morning of your procedure, take your Aspirin and any morning medicines NOT listed above.  You may use sips of water.  5. Plan for one night stay--bring personal belongings. 6. Bring a current list of your medications and current insurance cards. 7. You MUST have a responsible person to drive you home. 8. Someone MUST be with you the first 24 hours after you arrive home or your discharge will be delayed. 9. Please wear clothes that are easy to get on and off and wear slip-on shoes.  Thank you for allowing [place] to care for you!   -- [facility]    If you need a refill on your cardiac medications before your next appointment, please call your pharmacy.

## 2018-02-05 NOTE — H&P (View-Only) (Signed)
Cardiology Office Note   Date:  02/05/2018   ID:  ABIAGEAL BLOWE, DOB 01-28-1956, MRN [id number]  PCP:  [name], MD    No chief complaint on file.  Chronic systolic heart failure  Wt Readings from Last 3 Encounters:  02/05/18 293 lb (132.9 kg)  01/24/18 293 lb 6.4 oz (133.1 kg)  01/17/18 (!) 315 lb 12.8 oz (143.2 kg)       History of Present Illness: Terri Heath is a 61 y.o. female  Who was seen for [facility] in 5/19.  Echo revealed:  Left ventricle: The cavity size was mildly dilated. Wall   thickness was normal. Systolic function was severely reduced. The   estimated ejection fraction was in the range of 25% to 30%.   Diffuse hypokinesis. Doppler parameters are consistent with   restrictive physiology, indicative of decreased left ventricular   diastolic compliance and/or increased left atrial pressure.   Doppler parameters are consistent with high ventricular filling   pressure. - Mitral valve: There was moderate regurgitation. - Left atrium: The atrium was mildly dilated. - Right ventricle: The cavity size was moderately dilated. - Right atrium: The atrium was mildly dilated. - Pulmonary arteries: Systolic pressure was moderately increased.   PA peak pressure: 50 mm Hg (S).  Impressions:  - Definity used; global hypokinesis (worse in septum); overall   severely reduced LV systolic function; restrictive filling; 4   chamber enlargement; moderate MR; mild TR with moderate pulmonary   hypertension.   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  Normal iron studies.  SHe was started on Lasix and Entresto.    A1C was 14.  Family history with father who has severe vascular disease.    Past Medical History:  Diagnosis Date  . Allergy   . Bilateral shoulder pain   . Borderline hypertension   . Bronchitis   . Diabetes mellitus    Diagnosed in 2000  . Hyperlipidemia   . Obesity   . Sleep apnea     Past Surgical History:  Procedure  Laterality Date  . BREATH TEK H PYLORI  09/18/2011   Procedure: BREATH TEK H PYLORI;  Surgeon: Shann Medal, MD;  Location: [facility];  Service: General;  Laterality: N/A;  . CESAREAN SECTION     x 3  . LAPAROSCOPIC APPENDECTOMY N/A 10/03/2017   Procedure: APPENDECTOMY LAPAROSCOPIC;  Surgeon: Leighton Ruff, MD;  Location: [facility];  Service: General;  Laterality: N/A;     Current Outpatient Medications  Medication Sig Dispense Refill  . albuterol (PROAIR HFA) 108 (90 Base) MCG/ACT inhaler Inhale 2 puffs into the lungs every 6 (six) hours as needed. wheezing 18 g 2  . aspirin 325 MG tablet Take 650 mg by mouth every 4 (four) hours as needed (PAIN).    Marland Kitchen bismuth subsalicylate (PEPTO BISMOL) 262 MG chewable tablet Chew 524 mg by mouth as needed. Reported on 09/21/2015    . budesonide-formoterol (SYMBICORT) 80-4.5 MCG/ACT inhaler Inhale 2 puffs into the lungs 2 (two) times daily. 1 Inhaler 5  . cetirizine (ZYRTEC) 10 MG tablet Take 10 mg by mouth daily.    . diclofenac sodium (VOLTAREN) 1 % GEL Apply 2 g topically 4 (four) times daily. 100 g 1  . furosemide (LASIX) 40 MG tablet Take 1 tablet (40 mg total) by mouth daily. 90 tablet 3  . levothyroxine (SYNTHROID) 50 MCG tablet Take 1 tablet (50 mcg total) by mouth daily before breakfast. 90 tablet  3  . metFORMIN (GLUCOPHAGE) 1000 MG tablet Take 1 tablet (1,000 mg total) by mouth 2 (two) times daily with a meal. 180 tablet 2  . Multiple Vitamin (MULITIVITAMIN WITH MINERALS) TABS Take 1 tablet by mouth daily with breakfast.    . naproxen sodium (ANAPROX) 220 MG tablet Take 220 mg by mouth 2 (two) times daily as needed (pain).     . sacubitril-valsartan (ENTRESTO) 24-26 MG Take 1 tablet by mouth 2 (two) times daily. 60 tablet 11  . triamcinolone cream (KENALOG) 0.1 % Apply 1 application topically 2 (two) times daily. 30 g 0  . UNABLE TO FIND Compression Stocking QD - 15-20 mmh     No current facility-administered medications for this visit.      Allergies:   Exenatide; Lisinopril; Other; and Statins    Social History:  The patient  reports that she quit smoking about 28 years ago. Her smoking use included cigarettes. She has a 15.00 pack-year smoking history. She has never used smokeless tobacco. She reports that she does not drink alcohol or use drugs.   Family History:  The patient's family history includes Alcohol abuse in her father and mother; Arthritis in her mother; Diabetes in her mother; Heart disease in her father; Sleep apnea in her mother and sister.    ROS:  Please see the history of present illness.   Otherwise, review of systems are positive for difficulty controlling blood sugar.   All other systems are reviewed and negative.    PHYSICAL EXAM: VS:  BP 128/64   Pulse (!) 106   Ht 5\' 6"  (1.676 m)   Wt 293 lb (132.9 kg)   SpO2 94%   BMI 47.29 kg/m  , BMI Body mass index is 47.29 kg/m. GEN: Well nourished, well developed, in no acute distress  HEENT: normal  Neck: no JVD, carotid bruits, or masses Cardiac: RRR; no murmurs, rubs, or gallops,1+ bilateral LE edema ; 3+ right radial pulse Respiratory:  clear to auscultation bilaterally, normal work of breathing GI: soft, nontender, nondistended, + BS, obese MS: no deformity or atrophy  Skin: warm and dry, no rash Neuro:  Strength and sensation are intact Psych: euthymic mood, full affect    Recent Labs: 10/23/2017: ALT 16 01/14/2018: Hemoglobin 14.0; Platelets 232.0; Pro B Natriuretic peptide (BNP) 308.0 01/24/2018: BUN 16; Creatinine, Ser 0.86; Potassium 4.1; Sodium 139; TSH 6.040   Lipid Panel    Component Value Date/Time   CHOL 249 (H) 03/14/2016 1228   TRIG 142 03/14/2016 1228   HDL 61 03/14/2016 1228   CHOLHDL 4.1 03/14/2016 1228   VLDL 28 03/14/2016 1228   LDLCALC 160 (H) 03/14/2016 1228     Other studies Reviewed: Additional studies/ records that were reviewed today with results demonstrating: labs revewed.   ASSESSMENT AND  PLAN:  1. Chronic systolic heart failure: Uncelar etiology for low EF.  Multiple RF for CAD.  PLan for L/R [facility] with possible PCI.  Recent benefits of the procedure explained to the patient and she is willing to proceed.  All questions answered.  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  She now works in the central telemetry department. 2. Hypothyroid: Started Synthroid. 3. 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 Lindi Adie.  4. DM: Poorly controlled.  She is working on her diet.  5. Morbid obesity: Working on weight loss.  She stated that it has been a while since she had been below 300 lbs, but she did this after the Lasix was  started.      Current medicines are reviewed at length with the patient today.  The patient concerns regarding her medicines were addressed.  The following changes have been made:  No change  Labs/ tests ordered today include:  No orders of the defined types were placed in this encounter.   Recommend 150 minutes/week of aerobic exercise Low fat, low carb, high fiber diet recommended  Disposition:   FU for cath   Signed, Larae Grooms, MD  02/05/2018 3:06 PM    [facility] [place], [place], [place]  [address] Phone: [phone]; Fax: [phone]

## 2018-02-07 ENCOUNTER — Other Ambulatory Visit: Payer: 59 | Admitting: *Deleted

## 2018-02-07 DIAGNOSIS — I5022 Chronic systolic (congestive) heart failure: Secondary | ICD-10-CM

## 2018-02-08 LAB — BASIC METABOLIC PANEL
BUN/Creatinine Ratio: 19 (ref 12–28)
BUN: 15 mg/dL (ref 8–27)
CALCIUM: 9.2 mg/dL (ref 8.7–10.3)
CO2: 25 mmol/L (ref 20–29)
CREATININE: 0.79 mg/dL (ref 0.57–1.00)
Chloride: 97 mmol/L (ref 96–106)
GFR, EST AFRICAN AMERICAN: 93 mL/min/{1.73_m2} (ref >59)
GFR, EST NON AFRICAN AMERICAN: 81 mL/min/{1.73_m2} (ref >59)
Glucose: 259 mg/dL — ABNORMAL HIGH (ref 65–99)
Potassium: 4.5 mmol/L (ref 3.5–5.2)
Sodium: 139 mmol/L (ref 134–144)

## 2018-02-08 LAB — CBC
HEMATOCRIT: 47.3 % — AB (ref 34.0–46.6)
HEMOGLOBIN: 15 g/dL (ref 11.1–15.9)
MCH: 26.4 pg — AB (ref 26.6–33.0)
MCHC: 31.7 g/dL (ref 31.5–35.7)
MCV: 83 fL (ref 79–97)
Platelets: 220 10*3/uL (ref 150–450)
RBC: 5.69 x10E6/uL — AB (ref 3.77–5.28)
RDW: 15.8 % — ABNORMAL HIGH (ref 12.3–15.4)
WBC: 7.6 10*3/uL (ref 3.4–10.8)

## 2018-02-11 ENCOUNTER — Telehealth: Payer: Self-pay | Admitting: *Deleted

## 2018-02-11 NOTE — Telephone Encounter (Signed)
Catheterization scheduled at [facility] for: Tuesday June 18,2019 7:30 AM Verify arrival time and place: [facility] En[facility]ance A at: 5:30 AM  No solid food after midnight prior to cath, clear liquids until 5 AM day of procedure. Verify allergies in Epic   Hold: Metformin AM of procedure and 48 hours post procedure. Furosemide AM of procedure.  Except hold medications AM meds can be  taken pre-cath with sip of water including: ASA 81 mg (pt takes 325mg ).  Confirm patient has responsible person to drive home post procedure and observe patient for 24 hours  LMTCB to discuss instructions.

## 2018-02-11 NOTE — Telephone Encounter (Signed)
Voicemail.

## 2018-02-12 ENCOUNTER — Ambulatory Visit (HOSPITAL_COMMUNITY)
Admission: RE | Admit: 2018-02-12 | Discharge: 2018-02-12 | Disposition: A | Discharge status: Home / Self Care | Payer: 59 | Source: Ambulatory Visit | Attending: Interventional Cardiology | Admitting: Interventional Cardiology

## 2018-02-12 ENCOUNTER — Encounter (HOSPITAL_COMMUNITY)
Admission: RE | Disposition: A | Discharge status: Home / Self Care | Payer: Self-pay | Source: Ambulatory Visit | Attending: Interventional Cardiology

## 2018-02-12 ENCOUNTER — Encounter (HOSPITAL_COMMUNITY): Payer: Self-pay | Admitting: Interventional Cardiology

## 2018-02-12 DIAGNOSIS — Z7984 Long term (current) use of oral hypoglycemic drugs: Secondary | ICD-10-CM | POA: Insufficient documentation

## 2018-02-12 DIAGNOSIS — D472 Monoclonal gammopathy: Secondary | ICD-10-CM | POA: Diagnosis not present

## 2018-02-12 DIAGNOSIS — Z79890 Hormone replacement therapy (postmenopausal): Secondary | ICD-10-CM | POA: Insufficient documentation

## 2018-02-12 DIAGNOSIS — E785 Hyperlipidemia, unspecified: Secondary | ICD-10-CM | POA: Insufficient documentation

## 2018-02-12 DIAGNOSIS — I272 Pulmonary hypertension, unspecified: Secondary | ICD-10-CM | POA: Insufficient documentation

## 2018-02-12 DIAGNOSIS — Z9889 Other specified postprocedural states: Secondary | ICD-10-CM | POA: Insufficient documentation

## 2018-02-12 DIAGNOSIS — Z87891 Personal history of nicotine dependence: Secondary | ICD-10-CM | POA: Diagnosis not present

## 2018-02-12 DIAGNOSIS — Z7982 Long term (current) use of aspirin: Secondary | ICD-10-CM | POA: Diagnosis not present

## 2018-02-12 DIAGNOSIS — I2510 Atherosclerotic heart disease of native coronary artery without angina pectoris: Secondary | ICD-10-CM | POA: Insufficient documentation

## 2018-02-12 DIAGNOSIS — E1165 Type 2 diabetes mellitus with hyperglycemia: Secondary | ICD-10-CM | POA: Insufficient documentation

## 2018-02-12 DIAGNOSIS — Z888 Allergy status to other drugs, medicaments and biological substances status: Secondary | ICD-10-CM | POA: Diagnosis not present

## 2018-02-12 DIAGNOSIS — G4730 Sleep apnea, unspecified: Secondary | ICD-10-CM | POA: Diagnosis not present

## 2018-02-12 DIAGNOSIS — Z79899 Other long term (current) drug therapy: Secondary | ICD-10-CM | POA: Insufficient documentation

## 2018-02-12 DIAGNOSIS — Z6841 Body Mass Index (BMI) 40.0 and over, adult: Secondary | ICD-10-CM | POA: Diagnosis not present

## 2018-02-12 DIAGNOSIS — I5023 Acute on chronic systolic (congestive) heart failure: Secondary | ICD-10-CM | POA: Insufficient documentation

## 2018-02-12 DIAGNOSIS — E039 Hypothyroidism, unspecified: Secondary | ICD-10-CM | POA: Diagnosis not present

## 2018-02-12 DIAGNOSIS — I5021 Acute systolic (congestive) heart failure: Secondary | ICD-10-CM

## 2018-02-12 DIAGNOSIS — E119 Type 2 diabetes mellitus without complications: Secondary | ICD-10-CM

## 2018-02-12 HISTORY — PX: RIGHT/LEFT HEART CATH AND CORONARY ANGIOGRAPHY: CATH118266

## 2018-02-12 LAB — POCT I-STAT 3, ART BLOOD GAS (G3+)
ACID-BASE DEFICIT: 1 mmol/L (ref 0.0–2.0)
BICARBONATE: 24.7 mmol/L (ref 20.0–28.0)
O2 Saturation: 97 %
PO2 ART: 90 mmHg (ref 83.0–108.0)
TCO2: 26 mmol/L (ref 22–32)
pCO2 arterial: 43.4 mmHg (ref 32.0–48.0)
pH, Arterial: 7.363 (ref 7.350–7.450)

## 2018-02-12 LAB — POCT I-STAT 3, VENOUS BLOOD GAS (G3P V)
Bicarbonate: 26.9 mmol/L (ref 20.0–28.0)
O2 SAT: 69 %
PCO2 VEN: 49.1 mmHg (ref 44.0–60.0)
TCO2: 28 mmol/L (ref 22–32)
pH, Ven: 7.347 (ref 7.250–7.430)
pO2, Ven: 39 mmHg (ref 32.0–45.0)

## 2018-02-12 LAB — GLUCOSE, CAPILLARY
GLUCOSE-CAPILLARY: 281 mg/dL — AB (ref 65–99)
Glucose-Capillary: 311 mg/dL — ABNORMAL HIGH (ref 65–99)

## 2018-02-12 SURGERY — RIGHT/LEFT HEART CATH AND CORONARY ANGIOGRAPHY
Anesthesia: LOCAL

## 2018-02-12 MED ORDER — ACETAMINOPHEN 325 MG PO TABS: 650.0000 mg | ORAL_TABLET | ORAL | Status: DC | PRN

## 2018-02-12 MED ORDER — FENTANYL CITRATE (PF) 100 MCG/2ML IJ SOLN
INTRAMUSCULAR | Status: DC | PRN
  Administered 2018-02-12: 25 ug via INTRAVENOUS

## 2018-02-12 MED ORDER — ONDANSETRON HCL 4 MG/2ML IJ SOLN: 4.0000 mg | Freq: Four times a day (QID) | INTRAMUSCULAR | Status: DC | PRN

## 2018-02-12 MED ORDER — SODIUM CHLORIDE 0.9% FLUSH: 3.0000 mL | INTRAVENOUS | Status: DC | PRN

## 2018-02-12 MED ORDER — SODIUM CHLORIDE 0.9 % IV SOLN: 250.0000 mL | INTRAVENOUS | Status: DC | PRN

## 2018-02-12 MED ORDER — LIDOCAINE HCL (PF) 1 % IJ SOLN
INTRAMUSCULAR | Status: DC | PRN
  Administered 2018-02-12 (×2): 2 mL

## 2018-02-12 MED ORDER — HEPARIN (PORCINE) IN NACL 2-0.9 UNITS/ML
INTRAMUSCULAR | Status: DC | PRN
  Administered 2018-02-12: 1000 mL

## 2018-02-12 MED ORDER — ASPIRIN 81 MG PO CHEW: 81.0000 mg | CHEWABLE_TABLET | ORAL | Status: DC

## 2018-02-12 MED ORDER — HEPARIN SODIUM (PORCINE) 1000 UNIT/ML IJ SOLN
INTRAMUSCULAR | Status: DC | PRN
  Administered 2018-02-12: 6000 [IU] via INTRAVENOUS

## 2018-02-12 MED ORDER — INSULIN ASPART 100 UNIT/ML ~~LOC~~ SOLN
SUBCUTANEOUS | Status: AC
  Filled 2018-02-12: qty 1

## 2018-02-12 MED ORDER — INSULIN ASPART 100 UNIT/ML ~~LOC~~ SOLN
0.0000 [IU] | Freq: Three times a day (TID) | SUBCUTANEOUS | Status: DC
  Administered 2018-02-12: 8 [IU] via SUBCUTANEOUS
  Filled 2018-02-12 (×2): qty 0.15

## 2018-02-12 MED ORDER — METFORMIN HCL 1000 MG PO TABS: 1000.0000 mg | ORAL_TABLET | Freq: Two times a day (BID) | ORAL | 2 refills | Status: DC

## 2018-02-12 MED ORDER — FENTANYL CITRATE (PF) 100 MCG/2ML IJ SOLN
INTRAMUSCULAR | Status: AC
  Filled 2018-02-12: qty 2

## 2018-02-12 MED ORDER — VERAPAMIL HCL 2.5 MG/ML IV SOLN
INTRAVENOUS | Status: AC
  Filled 2018-02-12: qty 2

## 2018-02-12 MED ORDER — SODIUM CHLORIDE 0.9 % IV SOLN
INTRAVENOUS | Status: DC
  Administered 2018-02-12: 07:00:00 via INTRAVENOUS

## 2018-02-12 MED ORDER — HEPARIN SODIUM (PORCINE) 1000 UNIT/ML IJ SOLN
INTRAMUSCULAR | Status: AC
  Filled 2018-02-12: qty 1

## 2018-02-12 MED ORDER — HEPARIN (PORCINE) IN NACL 1000-0.9 UT/500ML-% IV SOLN
INTRAVENOUS | Status: AC
  Filled 2018-02-12: qty 1000

## 2018-02-12 MED ORDER — SODIUM CHLORIDE 0.9% FLUSH: 3.0000 mL | Freq: Two times a day (BID) | INTRAVENOUS | Status: DC

## 2018-02-12 MED ORDER — VERAPAMIL HCL 2.5 MG/ML IV SOLN
INTRAVENOUS | Status: DC | PRN
  Administered 2018-02-12: 10 mL via INTRA_ARTERIAL

## 2018-02-12 MED ORDER — LIDOCAINE HCL (PF) 1 % IJ SOLN
INTRAMUSCULAR | Status: AC
  Filled 2018-02-12: qty 30

## 2018-02-12 MED ORDER — IOHEXOL 350 MG/ML SOLN
INTRAVENOUS | Status: DC | PRN
  Administered 2018-02-12: 60 mL via INTRACARDIAC

## 2018-02-12 MED ORDER — MIDAZOLAM HCL 2 MG/2ML IJ SOLN
INTRAMUSCULAR | Status: DC | PRN
  Administered 2018-02-12: 1 mg via INTRAVENOUS

## 2018-02-12 MED ORDER — MIDAZOLAM HCL 2 MG/2ML IJ SOLN
INTRAMUSCULAR | Status: AC
  Filled 2018-02-12: qty 2

## 2018-02-12 MED FILL — ENTRESTO 24 MG-26 MG TABLET: 24-26 | 30 days supply | Qty: 60 | Fill #0

## 2018-02-12 SURGICAL SUPPLY — 14 items
CATH 5FR JL3.5 JR4 ANG PIG MP (CATHETERS) ×2 IMPLANT
CATH BALLN WEDGE 5F 110CM (CATHETERS) ×1 IMPLANT
DEVICE RAD COMP TR BAND LRG (VASCULAR PRODUCTS) ×1 IMPLANT
GUIDEWIRE INQWIRE 1.5J.035X260 (WIRE) IMPLANT
INQWIRE 1.5J .035X260CM (WIRE) ×2
KIT HEART LEFT (KITS) ×2 IMPLANT
NDL PERC 21GX4CM (NEEDLE) IMPLANT
NEEDLE PERC 21GX4CM (NEEDLE) ×2 IMPLANT
PACK CARDIAC CATHETERIZATION (CUSTOM PROCEDURE TRAY) ×2 IMPLANT
SHEATH AVANTI 11CM 5FR (SHEATH) IMPLANT
SHEATH GLIDE SLENDER 4/5FR (SHEATH) ×1 IMPLANT
SHEATH RAIN RADIAL 21G 6FR (SHEATH) ×1 IMPLANT
TRANSDUCER W/STOPCOCK (MISCELLANEOUS) ×2 IMPLANT
TUBING CIL FLEX 10 FLL-RA (TUBING) ×2 IMPLANT

## 2018-02-12 NOTE — Discharge Instructions (Signed)
NO METFORMIN/GLUCOPHAGE FOR 2 DAYS ° ° °Radial Site Care °Refer to this sheet in the next few weeks. These instructions provide you with information about caring for yourself after your procedure. Your health care provider may also give you more specific instructions. Your treatment has been planned according to current medical practices, but problems sometimes occur. Call your health care provider if you have any problems or questions after your procedure. °What can I expect after the procedure? °After your procedure, it is typical to have the following: °· Bruising at the radial site that usually fades within 1-2 weeks. °· Blood collecting in the tissue (hematoma) that may be painful to the touch. It should usually decrease in size and tenderness within 1-2 weeks. ° °Follow these instructions at home: °· Take medicines only as directed by your health care provider. °· You may shower 24-48 hours after the procedure or as directed by your health care provider. Remove the bandage (dressing) and gently wash the site with plain soap and water. Pat the area dry with a clean towel. Do not rub the site, because this may cause bleeding. °· Do not take baths, swim, or use a hot tub until your health care provider approves. °· Check your insertion site every day for redness, swelling, or drainage. °· Do not apply powder or lotion to the site. °· Do not flex or bend the affected arm for 24 hours or as directed by your health care provider. °· Do not push or pull heavy objects with the affected arm for 24 hours or as directed by your health care provider. °· Do not lift over 10 lb (4.5 kg) for 5 days after your procedure or as directed by your health care provider. °· Ask your health care provider when it is okay to: °? Return to work or school. °? Resume usual physical activities or sports. °? Resume sexual activity. °· Do not drive home if you are discharged the same day as the procedure. Have someone else drive you. °· You  may drive 24 hours after the procedure unless otherwise instructed by your health care provider. °· Do not operate machinery or power tools for 24 hours after the procedure. °· If your procedure was done as an outpatient procedure, which means that you went home the same day as your procedure, a responsible adult should be with you for the first 24 hours after you arrive home. °· Keep all follow-up visits as directed by your health care provider. This is important. °Contact a health care provider if: °· You have a fever. °· You have chills. °· You have increased bleeding from the radial site. Hold pressure on the site. °Get help right away if: °· You have unusual pain at the radial site. °· You have redness, warmth, or swelling at the radial site. °· You have drainage (other than a small amount of blood on the dressing) from the radial site. °· The radial site is bleeding, and the bleeding does not stop after 30 minutes of holding steady pressure on the site. °· Your arm or hand becomes pale, cool, tingly, or numb. °This information is not intended to replace advice given to you by your health care provider. Make sure you discuss any questions you have with your health care provider. °Document Released: 09/16/2010 Document Revised: 01/20/2016 Document Reviewed: 03/02/2014 °Elsevier Interactive Patient Education © 2018 Else[employer] Inc[employer] ° °

## 2018-02-12 NOTE — Interval H&P Note (Signed)
Cath Lab Visit (complete for each Cath Lab visit)  Clinical Evaluation Leading to the Procedure:   ACS: No.  Non-ACS:    Anginal Classification: CCS III  Anti-ischemic medical therapy: Minimal Therapy (1 class of medications)  Non-Invasive Test Results: High-risk stress test findings: cardiac mortality >3%/year, abnormal echo  Prior CABG: No previous CABG   Procedure described to patient and All questions regarding procedure answered.   History and Physical Interval Note:  02/12/2018 7:32 AM  Terri Heath  has presented today for surgery, with the diagnosis of hf  The various methods of treatment have been discussed with the patient and family. After consideration of risks, benefits and other options for treatment, the patient has consented to  Procedure(s): RIGHT/LEFT HEART CATH AND CORONARY ANGIOGRAPHY (N/A) as a surgical intervention .  The patient's history has been reviewed, patient examined, no change in status, stable for surgery.  I have reviewed the patient's chart and labs.  Questions were answered to the patient's satisfaction.     Larae Grooms

## 2018-02-12 NOTE — Telephone Encounter (Signed)
R and LHC done 02/12/18

## 2018-02-12 NOTE — Research (Deleted)
CADFEM Informed Consent   Subject Name: Terri Heath  Subject met inclusion and exclusion criteria.  The informed consent form, study requirements and expectations were reviewed with the subject and questions and concerns were addressed prior to the signing of the consent form.  The subject verbalized understanding of the trail requirements.  The subject agreed to participate in the CADFEM trial and signed the informed consent.  The informed consent was obtained prior to performance of any protocol-specific procedures for the subject.  A copy of the signed informed consent was given to the subject and a copy was placed in the subject's medical record.  Neva Seat 02/12/2018, 10:46 AM

## 2018-02-13 ENCOUNTER — Ambulatory Visit: Payer: 59 | Admitting: Family Medicine

## 2018-02-13 ENCOUNTER — Encounter: Payer: Self-pay | Admitting: Hematology and Oncology

## 2018-02-13 ENCOUNTER — Telehealth: Payer: Self-pay | Admitting: Interventional Cardiology

## 2018-02-13 ENCOUNTER — Telehealth: Payer: Self-pay | Admitting: Hematology and Oncology

## 2018-02-13 MED FILL — Heparin Sod (Porcine)-NaCl IV Soln 1000 Unit/500ML-0.9%: INTRAVENOUS | Qty: 1000 | Status: AC

## 2018-02-13 NOTE — Telephone Encounter (Signed)
I spoke with pt. She is asking why she is being referred to hematologist. I told her this was for Monoclonal gammopathy of unclear significance.  She reports she has appointment on 03/06/18. She is also asking about follow up and is aware she has been referred to heart failure clinic.  I told her CHF clinic would contact her with appointment information.

## 2018-02-13 NOTE — Telephone Encounter (Signed)
New message   Patient calling to discuss labs. Please see 01/28/18 phone encounter

## 2018-02-13 NOTE — Telephone Encounter (Signed)
New referral received from [facility] for the pt to see Dr. Lindi Adie on 7/10 at 345pm. Pt aware to arrive 30 minutes early. Letter mailed.

## 2018-02-14 ENCOUNTER — Encounter: Payer: 59 | Attending: Family Medicine | Admitting: Dietician

## 2018-02-14 ENCOUNTER — Encounter: Payer: Self-pay | Admitting: Dietician

## 2018-02-14 DIAGNOSIS — Z794 Long term (current) use of insulin: Secondary | ICD-10-CM

## 2018-02-14 DIAGNOSIS — E119 Type 2 diabetes mellitus without complications: Secondary | ICD-10-CM

## 2018-02-14 NOTE — Patient Instructions (Signed)
Consider 2000 units vitamin D daily Consider Vitamin B-12 (sublingual), Calcium Citrate Consider increasing your intake of non-starchy vegetables. Continue mindfulness. Find ways to be more active.

## 2018-02-17 NOTE — Progress Notes (Signed)
Diabetes Self-Management Education  Visit Type: First/Initial  Appt. Start Time: 0915 Appt. End Time: 1020  02/17/2018  [name], identified by name and date of birth, is a 62 y.o. female with a diagnosis of Diabetes: Type 2. Other history includes CHF, hypothyroid, OSA on c-pap, appendectomy 09/2013.   Labs include TSH 6.04.  Patient is on metformin.  She states that insulin was stopped when she lost 10 lbs in 2014. Patient states that she has lost 90 lbs in the past 5 years ans has followed a keto diet for the past 4 years "and if I could just stick to it, I would do better".  She states problems sticking to keto with food events such as church and socially.  She does not feel her coworkers understand her nutrition choices.  She tries to eat less than 60 grams of carbohydrates per day. 185 lbs and 5'8" when she was 62 yo.  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  She lives alone.  She is allergic to [facility] which causes GI bleed.  She is not receptive any changes to her currently meal plan.    ASSESSMENT  Height 5\' 7"  (1.702 m), weight 298 lb (135.2 kg). Body mass index is 46.67 kg/m.  Diabetes Self-Management Education - 02/14/18 1610      Visit Information   Visit Type  First/Initial      Initial Visit   Diabetes Type  Type 2    Are you currently following a meal plan?  Yes    What type of meal plan do you follow?  keto    Are you taking your medications as prescribed?  Yes    Date Diagnosed  2002      Health Coping   How would you rate your overall health?  Good      Psychosocial Assessment   Patient Belief/Attitude about Diabetes  Other (comment) constant battle    Self-care barriers  None    Self-management support  Doctor's office;Family    Other persons present  Patient    Patient Concerns  Nutrition/Meal planning;Glycemic Control;Weight Control    Special Needs  None    Preferred Learning Style  No  preference indicated    Learning Readiness  Ready    How often do you need to have someone help you when you read instructions, pamphlets, or other written materials from your doctor or pharmacy?  1 - Never    What is the last grade level you completed in school?  some college      Pre-Education Assessment   Patient understands the diabetes disease and treatment process.  Needs Review    Patient understands incorporating nutritional management into lifestyle.  Needs Review    Patient undertands incorporating physical activity into lifestyle.  Needs Review    Patient understands using medications safely.  Needs Review    Patient understands monitoring blood glucose, interpreting and using results  Needs Review    Patient understands prevention, detection, and treatment of acute complications.  Needs Review    Patient understands prevention, detection, and treatment of chronic complications.  Needs Review    Patient understands how to develop strategies to address psychosocial issues.  Needs Review    Patient understands how to develop strategies to promote health/change behavior.  Needs Review      Complications   Last HgB A1C per patient/outside source  14 % 01/16/18 increased from 11.9% 01/18/18  Fasting Blood glucose range (mg/dL)  >200 dawn phenomena    Postprandial Blood glucose range (mg/dL)  >200    Number of hypoglycemic episodes per month  0    Number of hyperglycemic episodes per week  21    Can you tell when your blood sugar is high?  Yes    What do you do if your blood sugar is high?  eat less carbohydrates    Have you had a dilated eye exam in the past 12 months?  Yes    Have you had a dental exam in the past 12 months?  Yes    Are you checking your feet?  Yes    How many days per week are you checking your feet?  7      Dietary Intake   Breakfast  eggs    Snack (morning)  none    Lunch  meat and vegetable    Snack (afternoon)  occasional quest bar OR halo ice cream     Dinner  meat and vegetable    Snack (evening)  none    Beverage(s)  water, unsweet tea, occasional diet soda      Exercise   Exercise Type  Light (walking / raking leaves) with her job      Patient Education   Previous Diabetes Education  Yes (please comment) 2013    Physical activity and exercise   Role of exercise on diabetes management, blood pressure control and cardiac health.;Helped patient identify appropriate exercises in relation to his/her diabetes, diabetes complications and other health issue.    Medications  Reviewed patients medication for diabetes, action, purpose, timing of dose and side effects.    Monitoring  Identified appropriate SMBG and/or A1C goals.    Chronic complications  Relationship between chronic complications and blood glucose control    Psychosocial adjustment  Worked with patient to identify barriers to care and solutions    Personal strategies to promote health  Lifestyle issues that need to be addressed for better diabetes care      Individualized Goals (developed by patient)   Nutrition  General guidelines for healthy choices and portions discussed    Physical Activity  Exercise 5-7 days per week;30 minutes per day    Medications  take my medication as prescribed    Monitoring   test my blood glucose as discussed    Reducing Risk  increase portions of healthy fats    Health Coping  discuss diabetes with (comment) MD, RD, CDE      Post-Education Assessment   Patient understands the diabetes disease and treatment process.  Demonstrates understanding / competency    Patient understands incorporating nutritional management into lifestyle.  Demonstrates understanding / competency    Patient undertands incorporating physical activity into lifestyle.  Demonstrates understanding / competency    Patient understands using medications safely.  Demonstrates understanding / competency    Patient understands monitoring blood glucose, interpreting and using results   Demonstrates understanding / competency    Patient understands prevention, detection, and treatment of acute complications.  Demonstrates understanding / competency    Patient understands prevention, detection, and treatment of chronic complications.  Demonstrates understanding / competency    Patient understands how to develop strategies to address psychosocial issues.  Demonstrates understanding / competency    Patient understands how to develop strategies to promote health/change behavior.  Demonstrates understanding / competency      Outcomes   Expected Outcomes  Other (comment) Demonstrated interest in learning  but question changes    Future DMSE  PRN    Program Status  Completed       Individualized Plan for Diabetes Self-Management Training:   Learning Objective:  Patient will have a greater understanding of diabetes self-management. Patient education plan is to attend individual and/or group sessions per assessed needs and concerns.   Plan:   Patient Instructions  Consider 2000 units vitamin D daily Consider Vitamin B-12 (sublingual), Calcium Citrate Consider increasing your intake of non-starchy vegetables. Continue mindfulness. Find ways to be more active.   Expected Outcomes:  Other (comment)(Demonstrated interest in learning but question changes)  Education material provided: Meal plan card  If problems or questions, patient to contact team via:  Phone  Future DSME appointment: PRN

## 2018-02-25 ENCOUNTER — Telehealth: Payer: Self-pay | Admitting: Interventional Cardiology

## 2018-02-25 DIAGNOSIS — I5022 Chronic systolic (congestive) heart failure: Secondary | ICD-10-CM

## 2018-02-25 MED ORDER — FUROSEMIDE 40 MG PO TABS: 40.0000 mg | ORAL_TABLET | Freq: Two times a day (BID) | ORAL | 3 refills | Status: DC

## 2018-02-25 NOTE — Telephone Encounter (Signed)
New message    Patient calling with questions regarding follow up care/ plan of care. Please call

## 2018-02-25 NOTE — Telephone Encounter (Signed)
Returned call to patient. Made patient aware that she will need to see Heart Failure first and then we will determine follow-up with Cardiology after that. Patient will increase lasix to 40 mg BID. We will add BMET to labs already scheduled for 7/15.

## 2018-02-27 ENCOUNTER — Telehealth (HOSPITAL_COMMUNITY): Payer: Self-pay | Admitting: Vascular Surgery

## 2018-02-27 NOTE — Telephone Encounter (Signed)
Left pt message to make NEW CHF appt first ava

## 2018-03-04 ENCOUNTER — Ambulatory Visit: Payer: 59 | Admitting: Pulmonary Disease

## 2018-03-04 ENCOUNTER — Encounter: Payer: Self-pay | Admitting: Pulmonary Disease

## 2018-03-04 DIAGNOSIS — J45909 Unspecified asthma, uncomplicated: Secondary | ICD-10-CM

## 2018-03-04 DIAGNOSIS — G4733 Obstructive sleep apnea (adult) (pediatric): Secondary | ICD-10-CM | POA: Diagnosis not present

## 2018-03-04 NOTE — Patient Instructions (Signed)
Decrease Symbicort to once daily, okay to stop on August 1 if you are breathing well.  CPAP is working well. Trial of air fit F 30 fullface mask instead of pillows to decrease leak

## 2018-03-04 NOTE — Assessment & Plan Note (Signed)
Decrease Symbicort to once daily, okay to stop on August 1 if you are breathing well.  Convinced this is true asthma, seems to be that shortness of breath is more related to nonischemic cardiac myopathy and fluid overload which is now improved

## 2018-03-04 NOTE — Progress Notes (Signed)
   Subjective:    Patient ID: Terri Heath, female    DOB: [birth year], 62 y.o.   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  She was ultimately diagnosed to have nonischemic cardiomyopathy, echo showed EF 25 to 30% with RVSP of 50. Cardiac cath showed 30% RCA lesion, 2% of circumflex lesion but this was felt not to account for the cardiomyopathy, pulmonary artery pressure was 42 mm She has appointment with the CHF service, diuresed and weight is down to 294 pounds and is breathing much better. She feels that Symbicort also has helped  She obtained a new CPAP machine in February 2019.  She does not like this is much as her old machine, reports dryness of mouth.  Uses nasal pillows. When CPAP download shows good control of events with large leak, average usage is about 5 hours every night   Significant tests/ events reviewed  PSG 12/2007 (335 lbs)  AHI 26/h with severe in REM, moderate PLMs.   SPirometry 2009 >> FEv1 64% with ratio of 72, FVC was 71% - mild restriction   FENO 10ppb 12/2017 Spirometry today shows more restrictive pattern with FEV1 53%, ratio 72, FVC 57   Review of Systems neg for any significant sore throat, dysphagia, itching, sneezing, nasal congestion or excess/ purulent secretions, fever, chills, sweats, unintended wt loss, pleuritic or exertional cp, hempoptysis, orthopnea pnd or change in chronic leg swelling.   Also denies presyncope, palpitations, heartburn, abdominal pain, nausea, vomiting, diarrhea or change in bowel or urinary habits, dysuria,hematuria, rash, arthralgias, visual complaints, headache, numbness weakness or ataxia.     Objective:   Physical Exam   Gen. Pleasant, obese, in no distress ENT - no thrush, no post nasal drip Neck: No JVD, no  thyromegaly, no carotid bruits Lungs: no use of accessory muscles, no dullness to percussion, decreased without rales or rhonchi  Cardiovascular: Rhythm regular, heart sounds  normal, no murmurs or gallops, 1+ peripheral edema Musculoskeletal: No deformities, no cyanosis or clubbing , no tremors         Assessment & Plan:

## 2018-03-04 NOTE — Assessment & Plan Note (Signed)
CPAP is working well. Trial of air fit F 30 fullface mask instead of pillows to decrease leak  Weight loss encouraged, compliance with goal of at least 4-6 hrs every night is the expectation. Advised against medications with sedative side effects Cautioned against driving when sleepy - understanding that sleepiness will vary on a day to day basis

## 2018-03-06 ENCOUNTER — Telehealth: Payer: Self-pay | Admitting: Hematology and Oncology

## 2018-03-06 ENCOUNTER — Inpatient Hospital Stay: Payer: 59 | Attending: Hematology and Oncology | Admitting: Hematology and Oncology

## 2018-03-06 DIAGNOSIS — Z87891 Personal history of nicotine dependence: Secondary | ICD-10-CM | POA: Diagnosis not present

## 2018-03-06 DIAGNOSIS — Z79899 Other long term (current) drug therapy: Secondary | ICD-10-CM | POA: Insufficient documentation

## 2018-03-06 DIAGNOSIS — G4730 Sleep apnea, unspecified: Secondary | ICD-10-CM | POA: Insufficient documentation

## 2018-03-06 DIAGNOSIS — Z7982 Long term (current) use of aspirin: Secondary | ICD-10-CM | POA: Diagnosis not present

## 2018-03-06 DIAGNOSIS — E119 Type 2 diabetes mellitus without complications: Secondary | ICD-10-CM | POA: Insufficient documentation

## 2018-03-06 DIAGNOSIS — D472 Monoclonal gammopathy: Secondary | ICD-10-CM | POA: Insufficient documentation

## 2018-03-06 DIAGNOSIS — Z7984 Long term (current) use of oral hypoglycemic drugs: Secondary | ICD-10-CM | POA: Insufficient documentation

## 2018-03-06 DIAGNOSIS — E669 Obesity, unspecified: Secondary | ICD-10-CM | POA: Insufficient documentation

## 2018-03-06 DIAGNOSIS — E785 Hyperlipidemia, unspecified: Secondary | ICD-10-CM | POA: Diagnosis not present

## 2018-03-06 NOTE — Telephone Encounter (Signed)
Scheduled appt per 7/10 los - pt aware of appt date and time.

## 2018-03-06 NOTE — Progress Notes (Signed)
[facility] CONSULT NOTE  Patient Care Team: [name], MD as PCP - General (Internal Medicine) Rigoberto Noel, MD as Consulting Physician (Pulmonary Disease) Alphonsa Overall, MD (General Surgery)  CHIEF COMPLAINTS/PURPOSE OF CONSULTATION:  MGUS  HISTORY OF PRESENTING ILLNESS:  Terri Heath 62 y.o. female is here because of recent diagnosis of elevated monoclonal protein.  Since May patient has been having multiple problems.  She gained 18 pounds in 1 day.  This led to extensive evaluation including with [place] to pulmonary and cardiology.  As part of the work-up she had a serum protein electrophoresis which revealed an IgM kappa monoclonal protein measuring 0.5 g.  Because of this finding she was referred to [place] for further evaluation.  She works for cardiology at [employer]  I reviewed her records extensively and collaborated the history with the patient.  MEDICAL HISTORY:  Past Medical History:  Diagnosis Date  . Allergy   . Bilateral shoulder pain   . Borderline hypertension   . Bronchitis   . Diabetes mellitus    Diagnosed in 2000  . Hyperlipidemia   . Obesity   . Sleep apnea     SURGICAL HISTORY: Past Surgical History:  Procedure Laterality Date  . BREATH TEK H PYLORI  09/18/2011   Procedure: BREATH TEK H PYLORI;  Surgeon: Shann Medal, MD;  Location: [facility];  Service: General;  Laterality: N/A;  . CESAREAN SECTION     x 3  . LAPAROSCOPIC APPENDECTOMY N/A 10/03/2017   Procedure: APPENDECTOMY LAPAROSCOPIC;  Surgeon: Leighton Ruff, MD;  Location: [facility];  Service: General;  Laterality: N/A;  . RIGHT/LEFT HEART CATH AND CORONARY ANGIOGRAPHY N/A 02/12/2018   Procedure: RIGHT/LEFT HEART CATH AND CORONARY ANGIOGRAPHY;  Surgeon: Jettie Booze, MD;  Location: [facility];  Service: Cardiovascular;  Laterality: N/A;    SOCIAL HISTORY: Social History   Socioeconomic History  . Marital status: Married    Spouse name: Not on file  .  Number of children: Not on file  . Years of education: Not on file  . Highest education level: Not on file  Occupational History  . Not on file  Social Needs  . Financial resource strain: Not on file  . Food insecurity:    Worry: Not on file    Inability: Not on file  . Transportation needs:    Medical: Not on file    Non-medical: Not on file  Tobacco Use  . Smoking status: Former Smoker    Packs/day: 1.00    Years: 15.00    Pack years: 15.00    Types: Cigarettes    Last attempt to quit: 01/28/1990    Years since quitting: 28.1  . Smokeless tobacco: Never Used  Substance and Sexual Activity  . Alcohol use: No  . Drug use: No  . Sexual activity: Not Currently  Lifestyle  . Physical activity:    Days per week: Not on file    Minutes per session: Not on file  . Stress: Not on file  Relationships  . Social connections:    Talks on phone: Not on file    Gets together: Not on file    Attends religious service: Not on file    Active member of club or organization: Not on file    Attends meetings of clubs or organizations: Not on file    Relationship status: Not on file  . Intimate partner violence:    Fear of current or ex  partner: Not on file    Emotionally abused: Not on file    Physically abused: Not on file    Forced sexual activity: Not on file  Other Topics Concern  . Not on file  Social History Narrative  . Not on file    FAMILY HISTORY: Family History  Problem Relation Age of Onset  . Alcohol abuse Mother   . Arthritis Mother   . Diabetes Mother   . Sleep apnea Mother   . Alcohol abuse Father   . Heart disease Father   . Sleep apnea Sister     ALLERGIES:  is allergic to adhesive [tape]; exenatide; lisinopril; other; and statins.  MEDICATIONS:  Current Outpatient Medications  Medication Sig Dispense Refill  . albuterol (PROAIR HFA) 108 (90 Base) MCG/ACT inhaler Inhale 2 puffs into the lungs every 6 (six) hours as needed. wheezing (Patient taking  differently: Inhale 2 puffs into the lungs every 6 (six) hours as needed for wheezing or shortness of breath. ) 18 g 2  . aspirin 325 MG tablet Take 325 mg by mouth daily.     Marland Kitchen bismuth subsalicylate (PEPTO BISMOL) 262 MG chewable tablet Chew 524 mg by mouth as needed for indigestion or diarrhea or loose stools.     . budesonide-formoterol (SYMBICORT) 80-4.5 MCG/ACT inhaler Inhale 2 puffs into the lungs 2 (two) times daily. 1 Inhaler 5  . cetirizine (ZYRTEC) 10 MG tablet Take 10 mg by mouth daily.    . diclofenac sodium (VOLTAREN) 1 % GEL Apply 2 g topically 4 (four) times daily. (Patient taking differently: Apply 2 g topically 4 (four) times daily as needed (for pain). ) 100 g 1  . fluticasone (FLONASE) 50 MCG/ACT nasal spray Place 2 sprays into both nostrils daily as needed for allergies or rhinitis.    . furosemide (LASIX) 40 MG tablet Take 1 tablet (40 mg total) by mouth 2 (two) times daily. 180 tablet 3  . levothyroxine (SYNTHROID) 50 MCG tablet Take 1 tablet (50 mcg total) by mouth daily before breakfast. 90 tablet 3  . magnesium oxide (MAG-OX) 400 MG tablet Take 400 mg by mouth at bedtime as needed (for cramps).    . metFORMIN (GLUCOPHAGE) 1000 MG tablet Take 1 tablet (1,000 mg total) by mouth 2 (two) times daily with a meal. 180 tablet 2  . Multiple Vitamin (MULITIVITAMIN WITH MINERALS) TABS Take 1 tablet by mouth daily with breakfast.    . naproxen sodium (ANAPROX) 220 MG tablet Take 220 mg by mouth 2 (two) times daily as needed (for pain or headache).     . sacubitril-valsartan (ENTRESTO) 24-26 MG Take 1 tablet by mouth 2 (two) times daily. 60 tablet 11  . triamcinolone cream (KENALOG) 0.1 % Apply 1 application topically 2 (two) times daily. 30 g 0   No current facility-administered medications for this visit.     REVIEW OF SYSTEMS:   Constitutional: Obese lady in no acute distress, denies fevers, chills or abnormal night sweats Eyes: Denies blurriness of vision, double vision or  watery eyes Ears, nose, mouth, throat, and face: Denies mucositis or sore throat Respiratory: Denies cough, dyspnea or wheezes Cardiovascular: Denies palpitation, chest discomfort or lower extremity swelling Gastrointestinal:  Denies nausea, heartburn or change in bowel habits Skin: Denies abnormal skin rashes Lymphatics: Denies new lymphadenopathy or easy bruising Neurological:Denies numbness, tingling or new weaknesses Behavioral/Psych: Mood is stable, no new changes   All other systems were reviewed with the patient and are negative.  PHYSICAL EXAMINATION: ECOG  PERFORMANCE STATUS: 1 - Symptomatic but completely ambulatory  Vitals:   03/06/18 1559  BP: 123/75  Pulse: (!) 110  Resp: 18  Temp: 98.5 F (36.9 C)  SpO2: 97%   Filed Weights   03/06/18 1559  Weight: 297 lb 3.2 oz (134.8 kg)    GENERAL:alert, no distress and comfortable SKIN: skin color, texture, turgor are normal, no rashes or significant lesions EYES: normal, conjunctiva are pink and non-injected, sclera clear OROPHARYNX:no exudate, no erythema and lips, buccal mucosa, and tongue normal  NECK: supple, thyroid normal size, non-tender, without nodularity LYMPH:  no palpable lymphadenopathy in the cervical, axillary or inguinal LUNGS: clear to auscultation and percussion with normal breathing effort HEART: regular rate & rhythm and no murmurs and no lower extremity edema ABDOMEN:abdomen soft, non-tender and normal bowel sounds Musculoskeletal:no cyanosis of digits and no clubbing  PSYCH: alert & oriented x 3 with fluent speech NEURO: no focal motor/sensory deficits  LABORATORY DATA:  I have reviewed the data as listed Lab Results  Component Value Date   WBC 7.6 02/07/2018   HGB 15.0 02/07/2018   HCT 47.3 (H) 02/07/2018   MCV 83 02/07/2018   PLT 220 02/07/2018   Lab Results  Component Value Date   NA 139 02/07/2018   K 4.5 02/07/2018   CL 97 02/07/2018   CO2 25 02/07/2018    RADIOGRAPHIC  STUDIES: I have personally reviewed the radiological reports and agreed with the findings in the report.  ASSESSMENT AND PLAN:  MGUS (monoclonal gammopathy of unknown significance) 01/24/2018 M protein 0.5 g IgM kappa light chain restricted IgG 1000 723, IgA 540, IgM 706 Hemoglobin 15 g Creatinine and calcium are normal  Counseling: I discussed with the patient the spectrum of disorders from MGUS to multiple myeloma. We discussed the role of plasma cells in producing immunoglobulins. We discussed structure of immunoglobulins on how they make up the heavy chains and the light chains. The light chains are Kappa and lambda. I discussed the difference between MGUS and multiple myeloma. MGUS is characterized by elevation monoclonal protein without any end organ damage. Multiple myeloma is associated with elevation monoclonal protein and end organ damage (hypercalcemia, renal dysfunction, anemia, bone lytic lesions ) along with a bone marrow showing greater than 10% plasma cells.  Workup recommended: 1. Bone survey Bone marrow biopsy only if absolutely necessary especially if the M protein is increasing rapidly.  Return to clinic in 6 months with labs and follow-up.  We will follow her every 6 months for a year and then annually thereafter.    All questions were answered. The patient knows to call the clinic with any problems, questions or concerns.    Harriette Ohara, MD 03/06/18

## 2018-03-06 NOTE — Assessment & Plan Note (Signed)
01/24/2018 M protein 0.5 g IgM kappa light chain restricted IgG 1000 723, IgA 540, IgM 706 Hemoglobin 15 g Creatinine and calcium are normal  Counseling: I discussed with the patient the spectrum of disorders from MGUS to multiple myeloma. We discussed the role of plasma cells in producing immunoglobulins. We discussed structure of immunoglobulins on how they make up the heavy chains and the light chains. The light chains are Kappa and lambda. I discussed the difference between MGUS and multiple myeloma. MGUS is characterized by elevation monoclonal protein without any end organ damage. Multiple myeloma is associated with elevation monoclonal protein and end organ damage (hypercalcemia, renal dysfunction, anemia, bone lytic lesions ) along with a bone marrow showing greater than 10% plasma cells.  Workup recommended: 1. Bone survey Bone marrow biopsy only if absolutely necessary especially if the M protein is increasing rapidly.  Return to clinic in 3 months with labs and follow-up.Marland Kitchen

## 2018-03-11 ENCOUNTER — Other Ambulatory Visit: Payer: 59

## 2018-03-14 ENCOUNTER — Other Ambulatory Visit: Payer: 59 | Admitting: *Deleted

## 2018-03-14 ENCOUNTER — Ambulatory Visit (HOSPITAL_COMMUNITY)
Admission: RE | Admit: 2018-03-14 | Discharge: 2018-03-14 | Disposition: A | Discharge status: Home / Self Care | Payer: 59 | Source: Ambulatory Visit | Attending: Hematology and Oncology | Admitting: Hematology and Oncology

## 2018-03-14 DIAGNOSIS — C9000 Multiple myeloma not having achieved remission: Secondary | ICD-10-CM | POA: Diagnosis not present

## 2018-03-14 DIAGNOSIS — D472 Monoclonal gammopathy: Secondary | ICD-10-CM | POA: Insufficient documentation

## 2018-03-14 DIAGNOSIS — R7989 Other specified abnormal findings of blood chemistry: Secondary | ICD-10-CM | POA: Diagnosis not present

## 2018-03-14 DIAGNOSIS — I5022 Chronic systolic (congestive) heart failure: Secondary | ICD-10-CM | POA: Diagnosis not present

## 2018-03-14 DIAGNOSIS — Z79899 Other long term (current) drug therapy: Secondary | ICD-10-CM

## 2018-03-14 LAB — BASIC METABOLIC PANEL
BUN / CREAT RATIO: 21 (ref 12–28)
BUN: 18 mg/dL (ref 8–27)
CALCIUM: 9.1 mg/dL (ref 8.7–10.3)
CO2: 21 mmol/L (ref 20–29)
Chloride: 96 mmol/L (ref 96–106)
Creatinine, Ser: 0.86 mg/dL (ref 0.57–1.00)
GFR calc Af Amer: 84 mL/min/{1.73_m2} (ref >59)
GFR calc non Af Amer: 73 mL/min/{1.73_m2} (ref >59)
GLUCOSE: 253 mg/dL — AB (ref 65–99)
POTASSIUM: 4.1 mmol/L (ref 3.5–5.2)
Sodium: 137 mmol/L (ref 134–144)

## 2018-03-14 LAB — T4, FREE: Free T4: 1.33 ng/dL (ref 0.82–1.77)

## 2018-03-14 LAB — TSH: TSH: 2.48 u[IU]/mL (ref 0.450–4.500)

## 2018-03-18 ENCOUNTER — Telehealth: Payer: Self-pay

## 2018-03-18 NOTE — Telephone Encounter (Signed)
Patient called in  About entresto being to expensive and she would like to change to a different medication.

## 2018-03-18 NOTE — Telephone Encounter (Signed)
The pt has questions about [name] and is requesting a call to discuss side effects of this medication.   I am addressing the cost of Entresto for the pt.

## 2018-03-18 NOTE — Telephone Encounter (Signed)
This is not a known side effect of Entresto. I am not sure that there is an association with the medication and healing.

## 2018-03-18 NOTE — Telephone Encounter (Signed)
Pt has been on Entresto 2 mo.  Took last dose this AM.  Cost is almost $100/mo which is unaffordable. She has concerns of skin healing since starting. Has a skin tear that is over 2 wks old that hasn't healed and also poison ivy since May that also hasn't healed.  In both cases she has what she calls poc marks on her skin instead of healed skin.  She would like to stay off medication for about 2 weeks to see if her skin heals normally.  She is aware that [facility] is working on patient assistance to help her afford this medication, and that I will forward to PharmD to review if skin healing issues occur with Entresto. Pt has diabetes, has had for 20+ year and has not had these issues until taking Entresto.

## 2018-03-18 NOTE — Telephone Encounter (Signed)
Spoke with pt to give results of bone survey.  Pt voiced understanding and thanks with no questions at this time.

## 2018-03-18 NOTE — Telephone Encounter (Signed)
-----   Message from Gardenia Phlegm, NP sent at 03/18/2018  9:03 AM EDT ----- Please let patient know that we don't see any bone lesions on her survey ----- Message ----- From: Interface, Rad Results In Sent: 03/14/2018   4:41 PM To: Nicholas Lose, MD

## 2018-03-19 ENCOUNTER — Telehealth: Payer: Self-pay

## 2018-03-19 NOTE — Telephone Encounter (Signed)
Left message for patient to call back  

## 2018-03-19 NOTE — Telephone Encounter (Signed)
**Note De-Identified Sharyon Peitz Obfuscation** I called [place] and s/w [place] to find out the cost of the pts Entresto. Per Benjamine Mola the pts copay is $95 for a 30 day supply.  Nataleah states that they have some of the $10 copay cards from the manufacturer at their pharmacy and that she will contact the pt to discuss the cards benefits of only paying $10 per month for a refill of Entresto and how to activate the card. Sharlette states that she will advise the pt that this office contacted her and that this is the response to her call to [place].

## 2018-03-20 MED FILL — ENTRESTO 24 MG-26 MG TABLET: 24-26 | 30 days supply | Qty: 60 | Fill #1

## 2018-03-20 MED FILL — FUROSEMIDE 40 MG TAB: 40 | 90 days supply | Qty: 180 | Fill #0

## 2018-03-20 NOTE — Telephone Encounter (Signed)
Patient calling back and states that she is still having problems with her skin healing since starting Entresto. She states that she has a skin tear on her toe and finger that hasn't healed and also poison ivy since May that also hasn't healed.  In both cases she has what she calls poc marks on her skin instead of healed skin. Made patient aware that our PharmD has reviewed the information and this is not a known side effect of Entresto. Instructed the patient to continue to take it until she is seen by Dr. Aundra Dubin on 8/14 and follow-up with her PCP/Endocrinologist. Also made patient aware of her lab results from 03/14/18. Patient verbalized understanding, was in agreement with the plan, and thanked me for the call.

## 2018-03-22 NOTE — Research (Signed)
RADPH Informed Consent   Subject Name: Terri Heath  Subject met inclusion and exclusion criteria.  The informed consent form, study requirements and expectations were reviewed with the subject and questions and concerns were addressed prior to the signing of the consent form.  The subject verbalized understanding of the trail requirements.  The subject agreed to participate in the [facility] trial and signed the informed consent.  The informed consent was obtained prior to performance of any protocol-specific procedures for the subject.  A copy of the signed informed consent was given to the subject and a copy was placed in the subject's medical record.  Neva Seat 02/12/2018, 10:46 AM

## 2018-04-10 ENCOUNTER — Ambulatory Visit (HOSPITAL_COMMUNITY)
Admission: RE | Admit: 2018-04-10 | Discharge: 2018-04-10 | Disposition: A | Discharge status: Home / Self Care | Payer: 59 | Source: Ambulatory Visit | Attending: Cardiology | Admitting: Cardiology

## 2018-04-10 VITALS — BP 112/69 | HR 106 | Wt 295.4 lb

## 2018-04-10 DIAGNOSIS — Z7984 Long term (current) use of oral hypoglycemic drugs: Secondary | ICD-10-CM | POA: Insufficient documentation

## 2018-04-10 DIAGNOSIS — I5022 Chronic systolic (congestive) heart failure: Secondary | ICD-10-CM | POA: Diagnosis not present

## 2018-04-10 DIAGNOSIS — Z9049 Acquired absence of other specified parts of digestive tract: Secondary | ICD-10-CM | POA: Diagnosis not present

## 2018-04-10 DIAGNOSIS — E7849 Other hyperlipidemia: Secondary | ICD-10-CM

## 2018-04-10 DIAGNOSIS — I739 Peripheral vascular disease, unspecified: Secondary | ICD-10-CM | POA: Diagnosis not present

## 2018-04-10 DIAGNOSIS — Z79899 Other long term (current) drug therapy: Secondary | ICD-10-CM | POA: Insufficient documentation

## 2018-04-10 DIAGNOSIS — E119 Type 2 diabetes mellitus without complications: Secondary | ICD-10-CM | POA: Diagnosis not present

## 2018-04-10 DIAGNOSIS — I2510 Atherosclerotic heart disease of native coronary artery without angina pectoris: Secondary | ICD-10-CM | POA: Insufficient documentation

## 2018-04-10 DIAGNOSIS — Z87891 Personal history of nicotine dependence: Secondary | ICD-10-CM | POA: Insufficient documentation

## 2018-04-10 DIAGNOSIS — G4733 Obstructive sleep apnea (adult) (pediatric): Secondary | ICD-10-CM | POA: Insufficient documentation

## 2018-04-10 DIAGNOSIS — D472 Monoclonal gammopathy: Secondary | ICD-10-CM | POA: Diagnosis not present

## 2018-04-10 DIAGNOSIS — E039 Hypothyroidism, unspecified: Secondary | ICD-10-CM | POA: Diagnosis not present

## 2018-04-10 DIAGNOSIS — I428 Other cardiomyopathies: Secondary | ICD-10-CM

## 2018-04-10 DIAGNOSIS — I2582 Chronic total occlusion of coronary artery: Secondary | ICD-10-CM | POA: Insufficient documentation

## 2018-04-10 DIAGNOSIS — Z7982 Long term (current) use of aspirin: Secondary | ICD-10-CM | POA: Insufficient documentation

## 2018-04-10 MED ORDER — CARVEDILOL 3.125 MG PO TABS: 3.1250 mg | ORAL_TABLET | Freq: Two times a day (BID) | ORAL | 3 refills | Status: DC

## 2018-04-10 MED ORDER — ASPIRIN 81 MG PO TABS: 81.0000 mg | ORAL_TABLET | Freq: Every day | ORAL | 3 refills | Status: DC

## 2018-04-10 MED FILL — CARVEDILOL 3.125 MG TABLET: 3.125 | 90 days supply | Qty: 180 | Fill #0

## 2018-04-10 MED FILL — ASPIRIN ADULT LOW STRENGTH: 81 | 30 days supply | Qty: 30 | Fill #0

## 2018-04-10 NOTE — Progress Notes (Signed)
PCP: [name], MD  Cardiology: Dr. Irish Lack HF Cardiology: Dr. Aundra Dubin  62 yo with history of chronic systolic CHF, CAD, and MGUS was referred by Dr. Irish Lack for evaluation of CHF.  Patient first developed exertional dyspnea after her appendectomy in 2/19.  By 5/19, she was gaining weight and had developed a chronic cough. She never had chest pain.  Echo was done in 5/19 showing EF 25-30%, moderate MR, moderate RV dilation.  RHC/LHC was done in 6/19 showing elevated filling pressures, preserved cardiac output, and occluded mid LCx and distal RCA.  She was found to have IgM monoclonal antibody.  Skeletal survey showed no lytic lesions, likely MGUS.    She is now on Lasix 40 mg bid.  She was given Entresto 24/26 bid but is not taking it.  She continues to work as a Nurse, adult.  No dyspnea doing ADLs around the house.  No dyspnea walking on flat ground. She does get short of breath walking up stairs and inclines.  No orthopnea/PND.  She has never had chest pain.  No palpitations.  Symptoms are improved since going on Lasix. She has had a wound on the left foot that has been slow to heal.  She is unable to take statins due to myalgias.   ECG (5/19, personally reviewed): sinus tachycardia at 106, poor RWP  Labs (5/19): transferrin saturation 20%, immunofixation with IgM monoclonal antibody.  Labs (6/19): K 4.5, creatinine 0.79 Labs (7/19): K 4.1, creatinine 0.86, TSH normal  PMH: 1. Type II diabetes 2. OSA 3. H/o appendectomy 4. 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  Skeletal survey in 7/19 showed no lytic lesions.  6. 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   - Echo (5/19): EF 25-30%, mild LV dilation, moderate MR, moderately dilated RV.  - LHC/RHC (6/19): distal RCA totally occluded, mid LCx totally occluded, EF < 25%. No intervention. Mean RA 10, mean PA 42, mean PCWP 29, CI 2.3.  7. CAD: LHC (6/19) with distal RCA  totally occluded, mid LCx totally occluded, EF < 25%. No intervention.  SH: Married, works as Nurse, adult, quit smoking in 1991.    FH: Mother with rheumatic MV disease, s/p MVR.   ROS: All systems reviewed and negative except as per HPI.   Current Outpatient Medications  Medication Sig Dispense Refill  . albuterol (PROAIR HFA) 108 (90 Base) MCG/ACT inhaler Inhale 2 puffs into the lungs every 6 (six) hours as needed. wheezing (Patient taking differently: Inhale 2 puffs into the lungs every 6 (six) hours as needed for wheezing or shortness of breath. ) 18 g 2  . aspirin 81 MG tablet Take 1 tablet (81 mg total) by mouth daily. 30 tablet 3  . bismuth subsalicylate (PEPTO BISMOL) 262 MG chewable tablet Chew 524 mg by mouth as needed for indigestion or diarrhea or loose stools.     . cetirizine (ZYRTEC) 10 MG tablet Take 10 mg by mouth daily.    . diclofenac sodium (VOLTAREN) 1 % GEL Apply 2 g topically 4 (four) times daily. (Patient taking differently: Apply 2 g topically 4 (four) times daily as needed (for pain). ) 100 g 1  . fluticasone (FLONASE) 50 MCG/ACT nasal spray Place 2 sprays into both nostrils daily as needed for allergies or rhinitis.    . furosemide (LASIX) 40 MG tablet Take 1 tablet (40 mg total) by mouth 2 (two) times daily. 180 tablet 3  . levothyroxine (SYNTHROID) 50 MCG  tablet Take 1 tablet (50 mcg total) by mouth daily before breakfast. 90 tablet 3  . magnesium oxide (MAG-OX) 400 MG tablet Take 400 mg by mouth at bedtime as needed (for cramps).    . metFORMIN (GLUCOPHAGE) 1000 MG tablet Take 1 tablet (1,000 mg total) by mouth 2 (two) times daily with a meal. 180 tablet 2  . Multiple Vitamin (MULITIVITAMIN WITH MINERALS) TABS Take 1 tablet by mouth daily with breakfast.    . triamcinolone cream (KENALOG) 0.1 % Apply 1 application topically 2 (two) times daily. 30 g 0  . carvedilol (COREG) 3.125 MG tablet Take 1 tablet (3.125 mg total) by mouth 2 (two) times daily. 180  tablet 3  . sacubitril-valsartan (ENTRESTO) 24-26 MG Take 1 tablet by mouth 2 (two) times daily. (Patient not taking: Reported on 04/10/2018) 60 tablet 11   No current facility-administered medications for this encounter.    BP 112/69   Pulse (!) 106   Wt 134 kg (295 lb 6.4 oz)   SpO2 98%   BMI 46.27 kg/m  General: NAD Neck: JVP 8 cm, no thyromegaly or thyroid nodule.  Lungs: Clear to auscultation bilaterally with normal respiratory effort. CV: Nondisplaced PMI.  Heart mildly tachy, regular S1/S2, no S3/S4, no murmur.  1+ ankle edema.  No carotid bruit.  Difficult to palpate pedal pulses.  Abdomen: Soft, nontender, no hepatosplenomegaly, no distention.  Skin: Intact without lesions or rashes.  Neurologic: Alert and oriented x 3.  Psych: Normal affect. Extremities: No clubbing or cyanosis. Small ulceration on left 2nd toe.  HEENT: Normal.   Assessment/Plan: 1. Chronic systolic CHF: Echo in 1/66 with EF 25-30%.  She had occluded mid LCx and distal RCA on coronary angiography.  Most likely, this is primarily an ischemic cardiomyopathy, but the EF somewhat lower than would be expected with the degree of disease.  She also has a monoclonal antibody.  On exam, she is mildly volume overloaded.  NYHA class II symptoms.  - I will arrange for a cardiac MRI.  Will look for coronary pattern scar.  As AL amyloidosis is a consideration with the monoclonal IgM, MRI will allow [place] to see amyloid-pattern late gadolinium enhancement if she indeed has cardiac amyloidosis.  - She will start back on Entresto 24/26 bid. BMET 10 days.  - Add Coreg 3.125 mg bid.  - Continue Lasix 40 mg bid. Will not increase as Entresto should give some additional diuresis.  - repeat echo in 3 months after aggressive medical treatment.  If EF remains low, she will need ICD.  2. CAD: Occluded mLCx and dRCA.  Low EF somewhat out of proportion to this.  No interventional target.   - Can decrease ASA to 81 mg daily.  - She cannot  tolerate statins, I will refer her to lipid clinic for [place].  3. Slowly healing toe ulcer: Pedal pulses difficult to palpate.   - I will order peripheral arterial dopplers.   Followup pharmacy clinic in 2 wks for med titration.  See me in 6 wks.   Loralie Champagne 04/10/2018

## 2018-04-10 NOTE — Patient Instructions (Signed)
DECREASE Aspirin to 81 mg Daily  START taking Coreg 3.125 mg Twice Daily  RESTART Entresto 24-26 mg Twice Daily  Referral Placed for Lipid Clinic to start [place].  Cardiac MRI has been ordered for you, once insurance approves we'll call and schedule procedure.   Peripheral Arterial Dopplers have been ordered for you, we'll call you to schedule.   Labs in 10 days (bmet)  Follow up with Pharmacy clinic in 2 weeks.  Follow up in 6-8 weeks with Dr. Aundra Dubin.

## 2018-04-11 ENCOUNTER — Other Ambulatory Visit (HOSPITAL_COMMUNITY): Payer: Self-pay | Admitting: Cardiology

## 2018-04-11 DIAGNOSIS — I739 Peripheral vascular disease, unspecified: Secondary | ICD-10-CM

## 2018-04-12 ENCOUNTER — Other Ambulatory Visit: Payer: Self-pay | Admitting: Family Medicine

## 2018-04-12 DIAGNOSIS — E119 Type 2 diabetes mellitus without complications: Secondary | ICD-10-CM

## 2018-04-15 ENCOUNTER — Telehealth: Payer: Self-pay | Admitting: Family Medicine

## 2018-04-15 ENCOUNTER — Ambulatory Visit (HOSPITAL_COMMUNITY)
Admission: RE | Admit: 2018-04-15 | Discharge: 2018-04-15 | Disposition: A | Discharge status: Home / Self Care | Payer: 59 | Source: Ambulatory Visit | Attending: Cardiovascular Disease | Admitting: Cardiovascular Disease

## 2018-04-15 DIAGNOSIS — I739 Peripheral vascular disease, unspecified: Secondary | ICD-10-CM | POA: Diagnosis not present

## 2018-04-15 NOTE — Telephone Encounter (Signed)
Copied from [place] [id number]. Topic: Quick Communication - See Telephone Encounter >> Apr 15, 2018 11:28 AM Loma [place] wrote: CRM for notification. See Telephone encounter for: 04/15/18.metFORMIN (GLUCOPHAGE) 1000 MG tablet Refill @ [facility]   [place], [place] said to call dr office

## 2018-04-15 NOTE — Telephone Encounter (Signed)
Need clarification on Metformin. Last refilled on 01/02/18 for 180 tabs. But is showing on the med list is last refilled on 02/12/18 by Larae Grooms, MD, but is not shown at that pharmacy. Lindsey shows it from 01/02/18. Last office visit 01/24/18 with Dr. Nolon Rod.

## 2018-04-16 ENCOUNTER — Encounter: Payer: Self-pay | Admitting: Cardiology

## 2018-04-16 NOTE — Telephone Encounter (Signed)
Patient was advised by [facility] to call Dr Nolon Rod because she needs a new script. Please advise.

## 2018-04-17 ENCOUNTER — Ambulatory Visit (HOSPITAL_COMMUNITY)
Admission: RE | Admit: 2018-04-17 | Discharge: 2018-04-17 | Disposition: A | Discharge status: Home / Self Care | Payer: 59 | Source: Ambulatory Visit | Attending: Cardiology | Admitting: Cardiology

## 2018-04-17 DIAGNOSIS — I340 Nonrheumatic mitral (valve) insufficiency: Secondary | ICD-10-CM | POA: Diagnosis not present

## 2018-04-17 DIAGNOSIS — I428 Other cardiomyopathies: Secondary | ICD-10-CM | POA: Diagnosis not present

## 2018-04-17 DIAGNOSIS — I429 Cardiomyopathy, unspecified: Secondary | ICD-10-CM | POA: Diagnosis not present

## 2018-04-17 DIAGNOSIS — I517 Cardiomegaly: Secondary | ICD-10-CM | POA: Diagnosis not present

## 2018-04-17 MED ORDER — GADOBENATE DIMEGLUMINE 529 MG/ML IV SOLN
35.0000 mL | Freq: Once | INTRAVENOUS | Status: AC | PRN
  Administered 2018-04-17: 35 mL via INTRAVENOUS

## 2018-04-17 NOTE — Telephone Encounter (Signed)
Please send in new rx for metformin see note. Dgaddy, CMA

## 2018-04-18 ENCOUNTER — Encounter: Payer: Self-pay | Admitting: Internal Medicine

## 2018-04-18 ENCOUNTER — Ambulatory Visit: Payer: 59 | Admitting: Internal Medicine

## 2018-04-18 VITALS — BP 126/64 | HR 99 | Ht 67.0 in | Wt 294.8 lb

## 2018-04-18 DIAGNOSIS — Z794 Long term (current) use of insulin: Secondary | ICD-10-CM

## 2018-04-18 DIAGNOSIS — E119 Type 2 diabetes mellitus without complications: Secondary | ICD-10-CM | POA: Diagnosis not present

## 2018-04-18 LAB — POCT GLYCOSYLATED HEMOGLOBIN (HGB A1C): HEMOGLOBIN A1C: 11.9 % — AB (ref 4.0–5.6)

## 2018-04-18 MED ORDER — SEMAGLUTIDE(0.25 OR 0.5MG/DOS) 2 MG/1.5ML ~~LOC~~ SOPN: 0.5000 mg | PEN_INJECTOR | SUBCUTANEOUS | 5 refills | Status: DC

## 2018-04-18 MED ORDER — INSULIN DEGLUDEC 200 UNIT/ML ~~LOC~~ SOPN: 18.0000 [IU] | PEN_INJECTOR | Freq: Every day | SUBCUTANEOUS | 5 refills | Status: DC

## 2018-04-18 MED ORDER — INSULIN PEN NEEDLE 32G X 4 MM MISC: 3 refills | Status: DC

## 2018-04-18 MED ORDER — METFORMIN HCL 1000 MG PO TABS: 1000.0000 mg | ORAL_TABLET | Freq: Two times a day (BID) | ORAL | 3 refills | Status: DC

## 2018-04-18 MED FILL — TRESIBA FLEXTOUCH 200 UNITS: 200 | 33 days supply | Qty: 3 | Fill #0

## 2018-04-18 MED FILL — UNIFINE PENTIPS 32GX5/32": 32G X 4 MM | 50 days supply | Qty: 100 | Fill #0

## 2018-04-18 MED FILL — metFORMIN HCL 1000 MG TABS: 1000 | 90 days supply | Qty: 180 | Fill #0

## 2018-04-18 MED FILL — UNIFINE PENTIPS 32GX5/32: 32G X 4 MM | 50 days supply | Qty: 100 | Fill #0

## 2018-04-18 NOTE — Patient Instructions (Addendum)
Please continue Metformin 1000 mg 2x a day with meals.  Please start: - Tresiba U200 18 units daily. You can increase the dose by 4 units every 4 days as needed  Please start: - Ozempic 0.25 mg weekly in a.m. (for example on Sunday morning) x 4 weeks, then increase to 0.5 mg weekly in a.m. if no nausea or hypoglycemia.  Please return in 3 months with your sugar log.   PATIENT INSTRUCTIONS FOR TYPE 2 DIABETES:  **Please join MyChart!** - see attached instructions about how to join if you have not done so already.  DIET AND EXERCISE Diet and exercise is an important part of diabetic treatment.  We recommended aerobic exercise in the form of brisk walking (working between 40-60% of maximal aerobic capacity, similar to brisk walking) for 150 minutes per week (such as 30 minutes five days per week) along with 3 times per week performing 'resistance' training (using various gauge rubber tubes with handles) 5-10 exercises involving the major muscle groups (upper body, lower body and core) performing 10-15 repetitions (or near fatigue) each exercise. Start at half the above goal but build slowly to reach the above goals. If limited by weight, joint pain, or disability, we recommend daily walking in a swimming pool with water up to waist to reduce pressure from joints while allow for adequate exercise.    BLOOD GLUCOSES Monitoring your blood glucoses is important for continued management of your diabetes. Please check your blood glucoses 2-4 times a day: fasting, before meals and at bedtime (you can rotate these measurements - e.g. one day check before the 3 meals, the next day check before 2 of the meals and before bedtime, etc.).   HYPOGLYCEMIA (low blood sugar) Hypoglycemia is usually a reaction to not eating, exercising, or taking too much insulin/ other diabetes drugs.  Symptoms include tremors, sweating, hunger, confusion, headache, etc. Treat IMMEDIATELY with 15 grams of Carbs: . 4 glucose  tablets .  cup regular juice/soda . 2 tablespoons raisins . 4 teaspoons sugar . 1 tablespoon honey Recheck blood glucose in 15 mins and repeat above if still symptomatic/blood glucose <100.  RECOMMENDATIONS TO REDUCE YOUR RISK OF DIABETIC COMPLICATIONS: * Take your prescribed MEDICATION(S) * Follow a DIABETIC diet: Complex carbs, fiber rich foods, (monounsaturated and polyunsaturated) fats * AVOID saturated/trans fats, high fat foods, >2,300 mg salt per day. * EXERCISE at least 5 times a week for 30 minutes or preferably daily.  * DO NOT SMOKE OR DRINK more than 1 drink a day. * Check your FEET every day. Do not wear tightfitting shoes. Contact us if you develop an ulcer * See your EYE doctor once a year or more if needed * Get a FLU shot once a year * Get a PNEUMONIA vaccine once before and once after age 7 years  GOALS:  * Your Hemoglobin A1c of <7%  * fasting sugars need to be <130 * after meals sugars need to be <180 (2h after you start eating) * Your Systolic BP should be 952 or lower  * Your Diastolic BP should be 80 or lower  * Your HDL (Good Cholesterol) should be 40 or higher  * Your LDL (Bad Cholesterol) should be 100 or lower. * Your Triglycerides should be 150 or lower  * Your Urine microalbumin (kidney function) should be <30 * Your Body Mass Index should be 25 or lower    Please consider the following ways to cut down carbs and fat and increase fiber and  micronutrients in your diet: - substitute whole grain for white bread or pasta - substitute brown rice for white rice - substitute 90-calorie flat bread pieces for slices of bread when possible - substitute sweet potatoes or yams for white potatoes - substitute humus for margarine - substitute tofu for cheese when possible - substitute almond or rice milk for regular milk (would not drink soy milk daily due to concern for soy estrogen influence on breast cancer risk) - substitute dark chocolate for other sweets  when possible - substitute water - can add lemon or orange slices for taste - for diet sodas (artificial sweeteners will trick your body that you can eat sweets without getting calories and will lead you to overeating and weight gain in the long run) - do not skip breakfast or other meals (this will slow down the metabolism and will result in more weight gain over time)  - can try smoothies made from fruit and almond/rice milk in am instead of regular breakfast - can also try old-fashioned (not instant) oatmeal made with almond/rice milk in am - order the dressing on the side when eating salad at a restaurant (pour less than half of the dressing on the salad) - eat as little meat as possible - can try juicing, but should not forget that juicing will get rid of the fiber, so would alternate with eating raw veg./fruits or drinking smoothies - use as little oil as possible, even when using olive oil - can dress a salad with a mix of balsamic vinegar and lemon juice, for e.g. - use agave nectar, stevia sugar, or regular sugar rather than artificial sweateners - steam or broil/roast veggies  - snack on veggies/fruit/nuts (unsalted, preferably) when possible, rather than processed foods - reduce or eliminate aspartame in diet (it is in diet sodas, chewing gum, etc) Read the labels!  Try to read Dr. Janene Harvey book: "Program for Reversing Diabetes" for other ideas for healthy eating.

## 2018-04-18 NOTE — Progress Notes (Signed)
Patient ID: Terri Heath, female   DOB: 09-09-1955, 62 y.o.   MRN: [id number]   HPI: Terri Heath is a 62 y.o.-year-old female, referred by her PCP, Dr. Nolon Rod, for management of DM2, dx in 2003, non-insulin-dependent, uncontrolled, with complications (PVD, CAD, sCHF).  Last hemoglobin A1c was: Lab Results  Component Value Date   HGBA1C 14.0 (A) 01/16/2018   HGBA1C 11.9 01/18/2017   HGBA1C 11.4 09/27/2016   HGBA1C 9.8 (H) 07/05/2016   HGBA1C 9.9 (H) 03/14/2016   HGBA1C 10.0 (H) 10/27/2015   HGBA1C 9.6 (H) 04/21/2015   HGBA1C 9.5 (H) 04/20/2015   HGBA1C 8.6 (H) 07/21/2014   HGBA1C 8.9 10/24/2012   Pt is on: - Metformin 1000 mg 2x a day  She has been on: - Actos - Metformin - JanuMet - Invokana >> yeast vaginitis - Byetta >> flu-like sxs - Humalog >> wt gain  - stopped insulin 2015 >> stopped >> lost 90 lbs!  Pt checks her sugars 1x a day and they are: - am: 230s - 2h after b'fast: n/c - before lunch: n/c - 2h after lunch: n/c - before dinner: n/c - 2h after dinner: n/c - bedtime: n/c - nighttime: n/c No lows. Lowest sugar was 198; she has hypoglycemia awareness at 160.  Highest sugar was 311.  Glucometer: ?  Pt's meals are: - Breakfast: 2 eggs + bacon or coffee - Lunch: meat + green veggies - Dinner: meat + veggies  - no CKD, last BUN/creatinine:  Lab Results  Component Value Date   BUN 18 03/14/2018   BUN 15 02/07/2018   CREATININE 0.86 03/14/2018   CREATININE 0.79 02/07/2018  On Entresto.  - + HL; last set of lipids: Lab Results  Component Value Date   CHOL 249 (H) 03/14/2016   HDL 61 03/14/2016   LDLCALC 160 (H) 03/14/2016   TRIG 142 03/14/2016   CHOLHDL 4.1 03/14/2016  Cardiology is planning to start her on Repatha.  - last eye exam was in 08/2017. No DR.  - + numbness and tingling in her feet. On ASA 81.  Pt has FH of DM in PGM, P aunts and uncles, M.  She also has a history of MGUS, OSA, hypothyroidism.  She is on levothyroxine  50 mcg daily.  Last TSH was normal 02/2018.  ROS: Constitutional: no weight gain/loss, + fatigue, no subjective hyperthermia/hypothermia Eyes: no blurry vision, no xerophthalmia ENT: no sore throat, no nodules palpated in throat, no dysphagia/odynophagia, no hoarseness Cardiovascular: no CP/SOB/palpitations/+ leg swelling Respiratory: no cough/SOB/+ wheezing Gastrointestinal: no N/V/D/C Musculoskeletal: no muscle/+ joint aches Skin: no rashes, + hir loss Neurological: no tremors/numbness/tingling/dizziness Psychiatric: no depression/anxiety  Past Medical History:  Diagnosis Date  . Allergy   . Bilateral shoulder pain   . Borderline hypertension   . Bronchitis   . Diabetes mellitus    Diagnosed in 2000  . Hyperlipidemia   . Obesity   . Sleep apnea    Past Surgical History:  Procedure Laterality Date  . BREATH TEK H PYLORI  09/18/2011   Procedure: BREATH TEK H PYLORI;  Surgeon: Shann Medal, MD;  Location: [facility];  Service: General;  Laterality: N/A;  . CESAREAN SECTION     62 x 3  . LAPAROSCOPIC APPENDECTOMY N/A 10/03/2017   Procedure: APPENDECTOMY LAPAROSCOPIC;  Surgeon: Leighton Ruff, MD;  Location: [facility];  Service: General;  Laterality: N/A;  . RIGHT/LEFT HEART CATH AND CORONARY ANGIOGRAPHY N/A 02/12/2018   Procedure: RIGHT/LEFT HEART CATH AND CORONARY ANGIOGRAPHY;  Surgeon: Jettie Booze, MD;  Location: [facility];  Service: Cardiovascular;  Laterality: N/A;   Social History   Socioeconomic History  . Marital status: Married    Spouse name: Not on file  . Number of children: Not on file  . Years of education: Not on file  . Highest education level: Not on file  Occupational History  . Not on file  Social Needs  . Financial resource strain: Not on file  . Food insecurity:    Worry: Not on file    Inability: Not on file  . Transportation needs:    Medical: Not on file    Non-medical: Not on file  Tobacco Use  . Smoking status: Former Smoker     Packs/day: 1.00    Years: 15.00    Pack years: 15.00    Types: Cigarettes    Last attempt to quit: 01/28/1990    Years since quitting: 28.2  . Smokeless tobacco: Never Used  Substance and Sexual Activity  . Alcohol use: No  . Drug use: No  . Sexual activity: Not Currently  Lifestyle  . Physical activity:    Days per week: Not on file    Minutes per session: Not on file  . Stress: Not on file  Relationships  . Social connections:    Talks on phone: Not on file    Gets together: Not on file    Attends religious service: Not on file    Active member of club or organization: Not on file    Attends meetings of clubs or organizations: Not on file    Relationship status: Not on file  . Intimate partner violence:    Fear of current or ex partner: Not on file    Emotionally abused: Not on file    Physically abused: Not on file    Forced sexual activity: Not on file  Other Topics Concern  . Not on file  Social History Narrative  . Not on file   Current Outpatient Medications on File Prior to Visit  Medication Sig Dispense Refill  . albuterol (PROAIR HFA) 108 (90 Base) MCG/ACT inhaler Inhale 2 puffs into the lungs every 6 (six) hours as needed. wheezing (Patient taking differently: Inhale 2 puffs into the lungs every 6 (six) hours as needed for wheezing or shortness of breath. ) 18 g 2  . aspirin 81 MG tablet Take 1 tablet (81 mg total) by mouth daily. 30 tablet 3  . bismuth subsalicylate (PEPTO BISMOL) 262 MG chewable tablet Chew 524 mg by mouth as needed for indigestion or diarrhea or loose stools.     . carvedilol (COREG) 3.125 MG tablet Take 1 tablet (3.125 mg total) by mouth 2 (two) times daily. 180 tablet 3  . cetirizine (ZYRTEC) 10 MG tablet Take 10 mg by mouth daily.    . diclofenac sodium (VOLTAREN) 1 % GEL Apply 2 g topically 4 (four) times daily. (Patient taking differently: Apply 2 g topically 4 (four) times daily as needed (for pain). ) 100 g 1  . fluticasone (FLONASE)  50 MCG/ACT nasal spray Place 2 sprays into both nostrils daily as needed for allergies or rhinitis.    . furosemide (LASIX) 40 MG tablet Take 1 tablet (40 mg total) by mouth 2 (two) times daily. 180 tablet 3  . levothyroxine (SYNTHROID) 50 MCG tablet Take 1 tablet (50 mcg total) by mouth daily before breakfast. 90 tablet 3  . magnesium oxide (MAG-OX) 400 MG tablet Take  400 mg by mouth at bedtime as needed (for cramps).    . metFORMIN (GLUCOPHAGE) 1000 MG tablet Take 1 tablet (1,000 mg total) by mouth 2 (two) times daily with a meal. 180 tablet 2  . Multiple Vitamin (MULITIVITAMIN WITH MINERALS) TABS Take 1 tablet by mouth daily with breakfast.    . sacubitril-valsartan (ENTRESTO) 24-26 MG Take 1 tablet by mouth 2 (two) times daily. 60 tablet 11  . triamcinolone cream (KENALOG) 0.1 % Apply 1 application topically 2 (two) times daily. 30 g 0   No current facility-administered medications on file prior to visit.    Allergies  Allergen Reactions  . Adhesive [Tape] Rash  . Exenatide Other (See Comments)    Flu-like symptoms  . Lisinopril Other (See Comments)    cramping  . Other Diarrhea and Other (See Comments)    Kale=food  Per patient, it causes GI bleeding   . Statins Other (See Comments)    Leg cramps   Family History  Problem Relation Age of Onset  . Alcohol abuse Mother   . Arthritis Mother   . Diabetes Mother   . Sleep apnea Mother   . Alcohol abuse Father   . Heart disease Father   . Sleep apnea Sister     PE: BP 126/64 (BP Location: Left Arm, Patient Position: Sitting, Cuff Size: Normal)   Pulse 99   Ht 5\' 7"  (1.702 m)   Wt 294 lb 12.8 oz (133.7 kg)   SpO2 95%   BMI 46.17 kg/m  Wt Readings from Last 3 Encounters:  04/18/18 294 lb 12.8 oz (133.7 kg)  04/10/18 295 lb 6.4 oz (134 kg)  03/06/18 297 lb 3.2 oz (134.8 kg)   Constitutional: Obese, in NAD Eyes: PERRLA, EOMI, no exophthalmos ENT: moist mucous membranes, no thyromegaly, no cervical  lymphadenopathy Cardiovascular: RRR, No MRG Respiratory: CTA B Gastrointestinal: abdomen soft, NT, ND, BS+ Musculoskeletal: no deformities, strength intact in all 4 Skin: moist, warm, no rashes Neurological: no tremor with outstretched hands, DTR normal in all 4  ASSESSMENT: 1. DM2, non-insulin-dependent, uncontrolled, with long-term complications - PVD - CAD, sCHF  PLAN:  1. Patient with long-standing, uncontrolled diabetes, only on Metformin, with very poor control.  HbA1c was 14% in 12/2017.  At today's visit, HbA1c is 11.9% (slightly lower). - she has medication intolerances and was on insulin 4 years ago but stopped 2/2 weight gain - at this visit, sugars are high in am (in the 230s) and she does not check later in the day, but they are likely higher. - we discussed that she is glucotoxic, and the best way to proceed for now is to add insulin. To avoid weight gain, we will also add a GLP1 R agonist. We discussed about benefits and possible SEs. She agrees with the plan. - we will continue Metformin, which she is tolerating well - I suggested to:  Patient Instructions  Please continue Metformin 1000 mg 2x a day with meals.  Please start: - Tresiba U200 18 units daily. You can increase the dose by 4 units every 4 days as needed  Please start: - Ozempic 0.25 mg weekly in a.m. (for example on Sunday morning) x 4 weeks, then increase to 0.5 mg weekly in a.m. if no nausea or hypoglycemia.  Please return in 3 months with your sugar log.   - Strongly advised her to start checking sugars at different times of the day - check 1-2x a day, rotating checks - given sugar log and  advised how to fill it and to bring it at next appt  - given foot care handout and explained the principles  - given instructions for hypoglycemia management "15-15 rule"  - advised for yearly eye exams  - Return to clinic in 3 mo with sugar log   Philemon Kingdom, MD PhD [facility]

## 2018-04-22 ENCOUNTER — Ambulatory Visit (HOSPITAL_COMMUNITY)
Admission: RE | Admit: 2018-04-22 | Discharge: 2018-04-22 | Disposition: A | Discharge status: Home / Self Care | Payer: 59 | Source: Ambulatory Visit | Attending: Cardiology | Admitting: Cardiology

## 2018-04-22 DIAGNOSIS — I5022 Chronic systolic (congestive) heart failure: Secondary | ICD-10-CM | POA: Diagnosis not present

## 2018-04-22 LAB — BASIC METABOLIC PANEL
Anion gap: 12 (ref 5–15)
BUN: 14 mg/dL (ref 8–23)
CHLORIDE: 101 mmol/L (ref 98–111)
CO2: 24 mmol/L (ref 22–32)
CREATININE: 0.55 mg/dL (ref 0.44–1.00)
Calcium: 9.1 mg/dL (ref 8.9–10.3)
GFR calc non Af Amer: >60 mL/min (ref >60)
GLUCOSE: 271 mg/dL — AB (ref 70–99)
Potassium: 3.9 mmol/L (ref 3.5–5.1)
Sodium: 137 mmol/L (ref 135–145)

## 2018-04-24 ENCOUNTER — Other Ambulatory Visit: Payer: Self-pay

## 2018-04-24 MED ORDER — DULAGLUTIDE 0.75 MG/0.5ML ~~LOC~~ SOAJ: 0.7500 mg | SUBCUTANEOUS | 0 refills | Status: AC

## 2018-04-24 MED FILL — TRULICITY 0.75 MG/0.5 ML PE: 0.75 | 28 days supply | Qty: 2 | Fill #0

## 2018-04-25 ENCOUNTER — Ambulatory Visit (HOSPITAL_COMMUNITY): Payer: 59

## 2018-04-25 MED FILL — LEVOTHYROXINE 50 MCG TABLET: 50 | 90 days supply | Qty: 90 | Fill #1

## 2018-04-25 NOTE — Telephone Encounter (Signed)
Dr. Cruzita Lederer filled her metformin on 8/22

## 2018-04-30 ENCOUNTER — Ambulatory Visit (HOSPITAL_COMMUNITY)
Admission: RE | Admit: 2018-04-30 | Discharge: 2018-04-30 | Disposition: A | Discharge status: Home / Self Care | Payer: 59 | Source: Ambulatory Visit | Attending: Cardiology | Admitting: Cardiology

## 2018-04-30 DIAGNOSIS — R05 Cough: Secondary | ICD-10-CM | POA: Insufficient documentation

## 2018-04-30 DIAGNOSIS — I2510 Atherosclerotic heart disease of native coronary artery without angina pectoris: Secondary | ICD-10-CM | POA: Insufficient documentation

## 2018-04-30 DIAGNOSIS — Z5181 Encounter for therapeutic drug level monitoring: Secondary | ICD-10-CM | POA: Diagnosis not present

## 2018-04-30 DIAGNOSIS — I5022 Chronic systolic (congestive) heart failure: Secondary | ICD-10-CM | POA: Diagnosis not present

## 2018-04-30 DIAGNOSIS — L97509 Non-pressure chronic ulcer of other part of unspecified foot with unspecified severity: Secondary | ICD-10-CM | POA: Diagnosis not present

## 2018-04-30 MED ORDER — SPIRONOLACTONE 25 MG PO TABS: 12.5000 mg | ORAL_TABLET | Freq: Every day | ORAL | 3 refills | Status: DC

## 2018-04-30 MED FILL — SPIRONOLACTONE 25 MG TABLET: 25 | 90 days supply | Qty: 45 | Fill #0

## 2018-04-30 NOTE — Patient Instructions (Addendum)
Please START spironolactone 12.5 mg (1/2 tablet) DAILY.   You are scheduled for blood work next Wednesday 9/11 at 9:00 am.   Please keep your appointment with Dr. Aundra Dubin on 06/10/18.

## 2018-04-30 NOTE — Progress Notes (Signed)
HF MD: [facility]  HPI:  62 yo with history of chronic systolic CHF, CAD, and MGUS was referred by Dr. Irish Lack for evaluation of CHF.  Patient first developed exertional dyspnea after her appendectomy in 2/19.  By 5/19, she was gaining weight and had developed a chronic cough. She never had chest pain.  Echo was done in 5/19 showing EF 25-30%, moderate MR, moderate RV dilation.  RHC/LHC was done in 6/19 showing elevated filling pressures, preserved cardiac output, and occluded mid LCx and distal RCA.  She was found to have IgM monoclonal antibody.  Skeletal survey showed no lytic lesions, likely MGUS.    She returns today for pharmacist-led HF medication titration. At last HF clinic visit on 8/14, her aspirin was decreased to 81 mg daily and she was started on carvedilol 3.125 mg BID and Entresto 24-26 mg BID. 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  No dyspnea doing ADLs around the house. She has OSA and wears CPAP nightly.      Marland Kitchen Shortness of breath/dyspnea on exertion? no  . Orthopnea/PND? No - wears CPAP . Edema? no . Lightheadedness/dizziness? no . Daily weights at home? Yes - 296>>291 lb  . Blood pressure/heart rate monitoring at home? Yes - 104/72 mmHg . Following low-sodium/fluid-restricted diet? Yes - mostly chicken and green beans  HF Medications: Carvedilol 3.125 mg PO BID Furosemide 40 mg PO BID Entresto 24-26 mg PO BID  Has the patient been experiencing any side effects to the medications prescribed?  no  Does the patient have any problems obtaining medications due to transportation or finances?   No - [employer] commercial + $10 Entresto copay card  Understanding of regimen: good Understanding of indications: good Potential of compliance: good Patient understands to avoid NSAIDs. Patient understands to avoid decongestants.    Pertinent Lab Values: . 04/22/18: Serum creatinine 0.55, BUN 14, Potassium 3.9, Sodium 137  Vital Signs: . Weight: 290 lb  (dry weight: 294 lb) . Blood pressure: 112/76 mmHg  . Heart rate: 87 bpm    Assessment: 1. Chronic systolic CHF (EF 84-13%), due to ICM. NYHA class II symptoms.  - Volume status stable  - Start spironolactone 12.5 mg daily  - Continue carvedilol 3.125 mg BID, furosemide 40 mg BID and Entreso 24-26 mg BID  - Basic disease state pathophysiology, medication indication, mechanism and side effects reviewed at length with patient and she verbalized understanding 2. CAD: Occluded mLCx and dRCA.  Low EF somewhat out of proportion to this.  No interventional target.   - Continue ASA to 81 mg daily - She cannot tolerate statins, referred to lipid clinic for [facility].  3. Slowly healing toe ulcer: Pedal pulses difficult to palpate.   - I will order peripheral arterial dopplers.    Plan: 1) Medication changes: Based on clinical presentation, vital signs and recent labs will start spironolactone 12.5 mg daily  2) Labs: BMET in 1 week  3) Follow-up: Dr. Aundra Dubin on 06/10/18   Ruta Hinds. Velva Harman, PharmD, BCPS, CPP Clinical Pharmacist Phone: [phone] 04/30/2018 1:56 PM

## 2018-05-02 ENCOUNTER — Telehealth (HOSPITAL_COMMUNITY): Payer: Self-pay | Admitting: *Deleted

## 2018-05-02 NOTE — Telephone Encounter (Signed)
Opened in error

## 2018-05-06 ENCOUNTER — Telehealth (HOSPITAL_COMMUNITY): Payer: Self-pay | Admitting: *Deleted

## 2018-05-06 DIAGNOSIS — Z760 Encounter for issue of repeat prescription: Secondary | ICD-10-CM | POA: Diagnosis not present

## 2018-05-06 MED FILL — ASPIRIN ADULT LOW STRENGTH: 81 | 30 days supply | Qty: 30 | Fill #1

## 2018-05-06 MED FILL — ENTRESTO 24 MG-26 MG TABLET: 24-26 | 30 days supply | Qty: 60 | Fill #2

## 2018-05-06 NOTE — Telephone Encounter (Signed)
Spoke with patient his morning and she stated that she took a full tablet of spiro instead of 1/2 tablet.  She felt bad and realized she was only suppose to take 1/2 tablet.  She has ordered a pill cutter and wants to reschedule her lab appt until she is able to start taking the medication.  She said her pill cutter should be in any day now.    Lab appt cancelled and patient will call back to reschedule.

## 2018-05-06 NOTE — Telephone Encounter (Signed)
Result Notes for MR Card Morphology Wo/W Cm   Notes recorded by Darron Doom, RN on 05/06/2018 at 11:41 AM EDT Called and spoke with patient, she's aware of results. ------  Notes recorded by Larey Dresser, MD on 04/18/2018 at 2:08 PM EDT EF 32%, evidence for prior MI, no MRI evidence for amyloidosis.

## 2018-05-08 ENCOUNTER — Other Ambulatory Visit (HOSPITAL_COMMUNITY): Payer: 59

## 2018-05-13 ENCOUNTER — Ambulatory Visit: Payer: 59 | Admitting: Family Medicine

## 2018-05-16 ENCOUNTER — Ambulatory Visit (HOSPITAL_COMMUNITY)
Admission: RE | Admit: 2018-05-16 | Discharge: 2018-05-16 | Disposition: A | Discharge status: Home / Self Care | Payer: 59 | Source: Ambulatory Visit | Attending: Internal Medicine | Admitting: Internal Medicine

## 2018-05-16 DIAGNOSIS — I5021 Acute systolic (congestive) heart failure: Secondary | ICD-10-CM | POA: Diagnosis not present

## 2018-05-16 LAB — BASIC METABOLIC PANEL
Anion gap: 9 (ref 5–15)
BUN: 13 mg/dL (ref 8–23)
CHLORIDE: 98 mmol/L (ref 98–111)
CO2: 31 mmol/L (ref 22–32)
CREATININE: 0.61 mg/dL (ref 0.44–1.00)
Calcium: 9 mg/dL (ref 8.9–10.3)
GFR calc non Af Amer: >60 mL/min (ref >60)
Glucose, Bld: 288 mg/dL — ABNORMAL HIGH (ref 70–99)
Potassium: 4.4 mmol/L (ref 3.5–5.1)
SODIUM: 138 mmol/L (ref 135–145)

## 2018-05-27 ENCOUNTER — Other Ambulatory Visit: Payer: Self-pay | Admitting: Internal Medicine

## 2018-05-27 MED FILL — TRESIBA FLEXTOUCH 200 UNITS: 200 | 33 days supply | Qty: 3 | Fill #1

## 2018-05-27 NOTE — Telephone Encounter (Signed)
Please advise, last chart note states to start Please start: - Ozempic 0.25 mg weekly in a.m

## 2018-05-27 NOTE — Telephone Encounter (Signed)
It is possible that Ozempic was not covered, but Trulicity was.  If so, she tolerates the Trulicity well, I would like her to increase the dose to 1.5 mg weekly.  Can you please check with her?

## 2018-05-28 NOTE — Telephone Encounter (Signed)
Left message for patient to return our call at [phone].  

## 2018-05-30 ENCOUNTER — Encounter: Payer: Self-pay | Admitting: Internal Medicine

## 2018-05-31 NOTE — Telephone Encounter (Signed)
See MyChart message

## 2018-06-03 ENCOUNTER — Other Ambulatory Visit: Payer: Self-pay

## 2018-06-03 ENCOUNTER — Other Ambulatory Visit: Payer: Self-pay | Admitting: Internal Medicine

## 2018-06-03 MED ORDER — DULAGLUTIDE 1.5 MG/0.5ML ~~LOC~~ SOAJ: SUBCUTANEOUS | 1 refills | Status: DC

## 2018-06-03 MED FILL — TRULICITY 1.5 MG/0.5 ML PEN: 1.5 | 28 days supply | Qty: 2 | Fill #0

## 2018-06-06 ENCOUNTER — Ambulatory Visit (INDEPENDENT_AMBULATORY_CARE_PROVIDER_SITE_OTHER): Payer: 59 | Admitting: Pharmacist

## 2018-06-06 DIAGNOSIS — E7849 Other hyperlipidemia: Secondary | ICD-10-CM

## 2018-06-06 LAB — LIPID PANEL
CHOL/HDL RATIO: 4.7 ratio — AB (ref 0.0–4.4)
Cholesterol, Total: 266 mg/dL — ABNORMAL HIGH (ref 100–199)
HDL: 57 mg/dL (ref >39)
LDL CALC: 187 mg/dL — AB (ref 0–99)
Triglycerides: 111 mg/dL (ref 0–149)
VLDL Cholesterol Cal: 22 mg/dL (ref 5–40)

## 2018-06-06 LAB — HEPATIC FUNCTION PANEL
ALBUMIN: 3.9 g/dL (ref 3.6–4.8)
ALT: 22 IU/L (ref 0–32)
AST: 16 IU/L (ref 0–40)
Alkaline Phosphatase: 82 IU/L (ref 39–117)
BILIRUBIN TOTAL: 0.4 mg/dL (ref 0.0–1.2)
Bilirubin, Direct: 0.12 mg/dL (ref 0.00–0.40)
TOTAL PROTEIN: 7.4 g/dL (ref 6.0–8.5)

## 2018-06-06 NOTE — Patient Instructions (Addendum)
WE will get a lipid panel today. We will call you with the results of the panel.   If your LDL is elevated we will send for coverage of Repatha 140mg  every 14 days.   Please call [phone] to activate your Repatha copay card.     Cholesterol Cholesterol is a fat. Your body needs a small amount of cholesterol. Cholesterol (plaque) may build up in your blood vessels (arteries). That makes you more likely to have a heart attack or stroke. You cannot feel your cholesterol level. Having a blood test is the only way to find out if your level is high. Keep your test results. Work with your doctor to keep your cholesterol at a good level. What do the results mean?  Total cholesterol is how much cholesterol is in your blood.  LDL is bad cholesterol. This is the type that can build up. Try to have low LDL.  HDL is good cholesterol. It cleans your blood vessels and carries LDL away. Try to have high HDL.  Triglycerides are fat that the body can store or burn for energy. What are good levels of cholesterol?  Total cholesterol below 200.  LDL below 100 is good for people who have health risks. LDL below 70 is good for people who have very high risks.  HDL above 40 is good. It is best to have HDL of 60 or higher.  Triglycerides below 150. How can I lower my cholesterol? Diet Follow your diet program as told by your doctor.  Choose fish, white meat chicken, or [place] that is roasted or baked. Try not to eat red meat, fried foods, sausage, or lunch meats.  Eat lots of fresh fruits and vegetables.  Choose whole grains, beans, pasta, potatoes, and cereals.  Choose olive oil, corn oil, or canola oil. Only use small amounts.  Try not to eat butter, mayonnaise, shortening, or palm kernel oils.  Try not to eat foods with trans fats.  Choose low-fat or nonfat dairy foods. ? Drink skim or nonfat milk. ? Eat low-fat or nonfat yogurt and cheeses. ? Try not to drink whole milk or  cream. ? Try not to eat ice cream, egg yolks, or full-fat cheeses.  Healthy desserts include angel food cake, ginger snaps, animal crackers, hard candy, popsicles, and low-fat or nonfat frozen yogurt. Try not to eat pastries, cakes, pies, and cookies.  Exercise Follow your exercise program as told by your doctor.  Be more active. Try gardening, walking, and taking the stairs.  Ask your doctor about ways that you can be more active.  Medicine  Take over-the-counter and prescription medicines only as told by your doctor. This information is not intended to replace advice given to you by your health care provider. Make sure you discuss any questions you have with your health care provider. Document Released: 11/10/2008 Document Revised: 03/15/2016 Document Reviewed: 02/24/2016 Elsevier Interactive Patient Education  [employer].

## 2018-06-06 NOTE — Progress Notes (Signed)
Patient ID: Terri Heath                 DOB: [birth year]                    MRN: [id number]     HPI: Terri Heath is a 62 y.o. female patient of Dr. Irish Lack that presents today for lipid evaluation.  PMH includes chronic systolic CHF, CAD, Echo was done in 5/19 showing EF 25-30%, moderate MR, moderate RV dilation.  RHC/LHC was done in 6/19 showing elevated filling pressures, preserved cardiac output, and occluded mid LCx and distal RCA.  She has had difficulty tolerating statin medications in the past.   She presents today for discussion of cholesterol. She states that she does not want to be here for this visit today. She states that she has improved her diet and almost eliminated carbohydrates and has lost several pounds over the last few years. She feels there is not much she can do to improve her diet without influencing her quality of life. She is confused about why her cholesterol is of concern now when she has not had a panel in 2 years and this frustrates her. She states that no matter what her chart states she has not had an MI because she never felt it. She is however agreeable to listen to what I have to say about options for cholesterol management.   Risk Factors: CAD  LDL Goal: <70  Current Medications: none Intolerances: pravastatin 20mg  and 10mg  daily (leg cramps in groin and legs), atorvastatin, Zocor, Crestor, lovastatin, Livalo, Zetia  Diet: She eats out and from home. She eats fast food when she eats out she generally eats fast food. She eats all types of meat. She does eat vegetables regularly. She uses oils and butter when cooks. She tries to limit carbohydrates.   Exercise: She walks daily.   Family History: The patient's family history includes Alcohol abuse in her father and mother; Arthritis in her mother; Diabetes in her mother; Heart disease in her father; Sleep apnea in her mother and sister.   Social History: The patient  reports that she quit smoking about  28 years ago. Her smoking use included cigarettes. She has a 15.00 pack-year smoking history. She has never used smokeless tobacco. She reports that she does not drink alcohol or use drugs.  Labs: 06/06/18:  TC 266, TG 111, HDL 57, LDL 187 (no therapy) 03/14/16:  TC 249, TG 142, HDL 61, LDL 160 (no therapy)  Past Medical History:  Diagnosis Date  . Allergy   . Bilateral shoulder pain   . Borderline hypertension   . Bronchitis   . Diabetes mellitus    Diagnosed in 2000  . Hyperlipidemia   . Obesity   . Sleep apnea     Current Outpatient Medications on File Prior to Visit  Medication Sig Dispense Refill  . albuterol (PROAIR HFA) 108 (90 Base) MCG/ACT inhaler Inhale 2 puffs into the lungs every 6 (six) hours as needed. wheezing (Patient taking differently: Inhale 2 puffs into the lungs every 6 (six) hours as needed for wheezing or shortness of breath. ) 18 g 2  . aspirin 81 MG tablet Take 1 tablet (81 mg total) by mouth daily. 30 tablet 3  . bismuth subsalicylate (PEPTO BISMOL) 262 MG chewable tablet Chew 524 mg by mouth as needed for indigestion or diarrhea or loose stools.     . carvedilol (COREG) 3.125 MG tablet Take 1  tablet (3.125 mg total) by mouth 2 (two) times daily. 180 tablet 3  . cetirizine (ZYRTEC) 10 MG tablet Take 10 mg by mouth daily.    . diclofenac sodium (VOLTAREN) 1 % GEL Apply 2 g topically 4 (four) times daily. (Patient taking differently: Apply 2 g topically 4 (four) times daily as needed (for pain). ) 100 g 1  . Dulaglutide (TRULICITY) 1.5 XB/1.4NW SOPN Inject into the skin once a week. 4 pen 1  . fluticasone (FLONASE) 50 MCG/ACT nasal spray Place 2 sprays into both nostrils daily as needed for allergies or rhinitis.    . furosemide (LASIX) 40 MG tablet Take 1 tablet (40 mg total) by mouth 2 (two) times daily. 180 tablet 3  . Insulin Degludec (TRESIBA FLEXTOUCH) 200 UNIT/ML SOPN Inject 18 Units into the skin daily. 3 pen 5  . Insulin Pen Needle 32G X 4 MM MISC Use 2x  a day 200 each 3  . levothyroxine (SYNTHROID) 50 MCG tablet Take 1 tablet (50 mcg total) by mouth daily before breakfast. 90 tablet 3  . magnesium oxide (MAG-OX) 400 MG tablet Take 400 mg by mouth at bedtime as needed (for cramps).    . metFORMIN (GLUCOPHAGE) 1000 MG tablet Take 1 tablet (1,000 mg total) by mouth 2 (two) times daily with a meal. 180 tablet 3  . Multiple Vitamin (MULITIVITAMIN WITH MINERALS) TABS Take 1 tablet by mouth daily with breakfast.    . sacubitril-valsartan (ENTRESTO) 24-26 MG Take 1 tablet by mouth 2 (two) times daily. 60 tablet 11  . spironolactone (ALDACTONE) 25 MG tablet Take 0.5 tablets (12.5 mg total) by mouth daily. 90 tablet 3   No current facility-administered medications on file prior to visit.     Allergies  Allergen Reactions  . Adhesive [Tape] Rash  . Exenatide Other (See Comments)    Flu-like symptoms  . Invokana [Canagliflozin] Other (See Comments)    Yeast infection/dehydration  . Lisinopril Other (See Comments)    cramping  . Other Diarrhea and Other (See Comments)    Kale=food  Per patient, it causes GI bleeding   . Statins Other (See Comments)    Leg cramps    Assessment/Plan: Hyperlipidemia: LDL is not at goal on panel from yesterday. Discussed options with her and she is agreeable to try Repatha. Discussed injection technique, cost (pt should qualify for copay card with commercial insurance), and risk/benefits. Will contact her once we have received approval through insurance.   Thank you,  Lelan Pons. Patterson Hammersmith, [facility]  06/06/2018 7:20 AM

## 2018-06-07 ENCOUNTER — Encounter: Payer: Self-pay | Admitting: Pharmacist

## 2018-06-07 MED FILL — ASPIRIN ADULT LOW STRENGTH: 81 | 30 days supply | Qty: 30 | Fill #2

## 2018-06-07 MED FILL — ENTRESTO 24 MG-26 MG TABLET: 24-26 | 30 days supply | Qty: 60 | Fill #3

## 2018-06-10 ENCOUNTER — Encounter (HOSPITAL_COMMUNITY): Payer: Self-pay

## 2018-06-10 ENCOUNTER — Ambulatory Visit (HOSPITAL_COMMUNITY)
Admission: RE | Admit: 2018-06-10 | Discharge: 2018-06-10 | Disposition: A | Discharge status: Home / Self Care | Payer: 59 | Source: Ambulatory Visit | Attending: Cardiology | Admitting: Cardiology

## 2018-06-10 VITALS — BP 102/56 | HR 98 | Wt 292.4 lb

## 2018-06-10 DIAGNOSIS — I5022 Chronic systolic (congestive) heart failure: Secondary | ICD-10-CM | POA: Insufficient documentation

## 2018-06-10 DIAGNOSIS — Z87891 Personal history of nicotine dependence: Secondary | ICD-10-CM | POA: Diagnosis not present

## 2018-06-10 DIAGNOSIS — E039 Hypothyroidism, unspecified: Secondary | ICD-10-CM | POA: Insufficient documentation

## 2018-06-10 DIAGNOSIS — E119 Type 2 diabetes mellitus without complications: Secondary | ICD-10-CM | POA: Diagnosis not present

## 2018-06-10 DIAGNOSIS — Z7951 Long term (current) use of inhaled steroids: Secondary | ICD-10-CM | POA: Diagnosis not present

## 2018-06-10 DIAGNOSIS — I252 Old myocardial infarction: Secondary | ICD-10-CM | POA: Diagnosis not present

## 2018-06-10 DIAGNOSIS — L97509 Non-pressure chronic ulcer of other part of unspecified foot with unspecified severity: Secondary | ICD-10-CM | POA: Diagnosis not present

## 2018-06-10 DIAGNOSIS — G4733 Obstructive sleep apnea (adult) (pediatric): Secondary | ICD-10-CM | POA: Insufficient documentation

## 2018-06-10 DIAGNOSIS — Z7982 Long term (current) use of aspirin: Secondary | ICD-10-CM | POA: Insufficient documentation

## 2018-06-10 DIAGNOSIS — I2510 Atherosclerotic heart disease of native coronary artery without angina pectoris: Secondary | ICD-10-CM | POA: Insufficient documentation

## 2018-06-10 DIAGNOSIS — Z79890 Hormone replacement therapy (postmenopausal): Secondary | ICD-10-CM | POA: Insufficient documentation

## 2018-06-10 DIAGNOSIS — Z794 Long term (current) use of insulin: Secondary | ICD-10-CM | POA: Insufficient documentation

## 2018-06-10 DIAGNOSIS — Z79899 Other long term (current) drug therapy: Secondary | ICD-10-CM | POA: Insufficient documentation

## 2018-06-10 DIAGNOSIS — E785 Hyperlipidemia, unspecified: Secondary | ICD-10-CM | POA: Insufficient documentation

## 2018-06-10 LAB — BASIC METABOLIC PANEL
Anion gap: 11 (ref 5–15)
BUN: 16 mg/dL (ref 8–23)
CHLORIDE: 99 mmol/L (ref 98–111)
CO2: 28 mmol/L (ref 22–32)
CREATININE: 0.83 mg/dL (ref 0.44–1.00)
Calcium: 9.1 mg/dL (ref 8.9–10.3)
Glucose, Bld: 223 mg/dL — ABNORMAL HIGH (ref 70–99)
POTASSIUM: 4.4 mmol/L (ref 3.5–5.1)
SODIUM: 138 mmol/L (ref 135–145)

## 2018-06-10 MED ORDER — CARVEDILOL 6.25 MG PO TABS: 6.2500 mg | ORAL_TABLET | Freq: Two times a day (BID) | ORAL | 2 refills | Status: DC

## 2018-06-10 MED ORDER — EMPAGLIFLOZIN 10 MG PO TABS: 10.0000 mg | ORAL_TABLET | Freq: Every day | ORAL | 6 refills | Status: DC

## 2018-06-10 NOTE — Progress Notes (Signed)
PCP: [name], MD  Cardiology: Dr. Irish Lack HF Cardiology: Dr. Aundra Dubin  62 yo with history of chronic systolic CHF, CAD, and MGUS was referred by Dr. Irish Lack for evaluation of CHF.  Patient first developed exertional dyspnea after her appendectomy in 2/19.  By 5/19, she was gaining weight and had developed a chronic cough. She never had chest pain.  Echo was done in 5/19 showing EF 25-30%, moderate MR, moderate RV dilation.  RHC/LHC was done in 6/19 showing elevated filling pressures, preserved cardiac output, and occluded mid LCx and distal RCA.  She was found to have IgM monoclonal antibody.  Skeletal survey showed no lytic lesions, likely MGUS.  Cardiac MRI in 8/19 showed EF 32%, subendocardial scar (consistent with prior MI) at inferior base, moderately dilated RV with RV EF 20%. No evidence for cardiac amyloidosis by MRI.   She returns for followup of CHF.  Breathing is better is better on Entresto.  No dyspnea walking up stairs or walking through [employer].  No lightheadedness or dizziness.  No chest pain.  No orthopnea/PND. Glucose still running in 200s.    Labs (5/19): transferrin saturation 20%, immunofixation with IgM monoclonal antibody.  Labs (6/19): K 4.5, creatinine 0.79 Labs (7/19): K 4.1, creatinine 0.86, TSH normal Labs (9/19): K 4.4, creatinine 0.61 Labs (10/19): LDL 187  PMH: 1. Type II diabetes 2. OSA 3. H/o appendectomy 4. 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  Skeletal survey in 7/19 showed no lytic lesions.  6. 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   - Echo (5/19): EF 25-30%, mild LV dilation, moderate MR, moderately dilated RV.  - LHC/RHC (6/19): distal RCA totally occluded, mid LCx totally occluded, EF < 25%. No intervention. Mean RA 10, mean PA 42, mean PCWP 29, CI 2.3.  - Cardiac MRI in 8/19 showed EF 32%, subendocardial scar (consistent with prior MI) at inferior base, moderately dilated RV with  RV EF 20%. No evidence for cardiac amyloidosis by MRI.  7. CAD: LHC (6/19) with distal RCA totally occluded, mid LCx totally occluded, EF < 25%. No intervention. 8. Hyperlipidemia: Has not tolerated statins.  9. ABIs (8/19): Normal.   SH: Married, works as Nurse, adult, quit smoking in 1991.    FH: Mother with rheumatic MV disease, s/p MVR.   ROS: All systems reviewed and negative except as per HPI.   Current Outpatient Medications  Medication Sig Dispense Refill  . albuterol (PROAIR HFA) 108 (90 Base) MCG/ACT inhaler Inhale 2 puffs into the lungs every 6 (six) hours as needed. wheezing (Patient taking differently: Inhale 2 puffs into the lungs every 6 (six) hours as needed for wheezing or shortness of breath. ) 18 g 2  . aspirin 81 MG tablet Take 1 tablet (81 mg total) by mouth daily. 30 tablet 3  . bismuth subsalicylate (PEPTO BISMOL) 262 MG chewable tablet Chew 524 mg by mouth as needed for indigestion or diarrhea or loose stools.     . carvedilol (COREG) 6.25 MG tablet Take 1 tablet (6.25 mg total) by mouth 2 (two) times daily. 60 tablet 2  . cetirizine (ZYRTEC) 10 MG tablet Take 10 mg by mouth daily.    . diclofenac sodium (VOLTAREN) 1 % GEL Apply 2 g topically 4 (four) times daily. (Patient taking differently: Apply 2 g topically 4 (four) times daily as needed (for pain). ) 100 g 1  . Dulaglutide (TRULICITY) 1.5 WU/1.3KG SOPN Inject into the skin once a week. 4 pen  1  . fluticasone (FLONASE) 50 MCG/ACT nasal spray Place 2 sprays into both nostrils daily as needed for allergies or rhinitis.    . furosemide (LASIX) 40 MG tablet Take 1 tablet (40 mg total) by mouth 2 (two) times daily. 180 tablet 3  . Insulin Degludec (TRESIBA FLEXTOUCH) 200 UNIT/ML SOPN Inject 18 Units into the skin daily. 3 pen 5  . Insulin Pen Needle 32G X 4 MM MISC Use 2x a day 200 each 3  . levothyroxine (SYNTHROID) 50 MCG tablet Take 1 tablet (50 mcg total) by mouth daily before breakfast. 90 tablet 3  .  magnesium oxide (MAG-OX) 400 MG tablet Take 400 mg by mouth at bedtime as needed (for cramps).    . metFORMIN (GLUCOPHAGE) 1000 MG tablet Take 1 tablet (1,000 mg total) by mouth 2 (two) times daily with a meal. 180 tablet 3  . Multiple Vitamin (MULITIVITAMIN WITH MINERALS) TABS Take 1 tablet by mouth daily with breakfast.    . sacubitril-valsartan (ENTRESTO) 24-26 MG Take 1 tablet by mouth 2 (two) times daily. 60 tablet 11  . spironolactone (ALDACTONE) 25 MG tablet Take 0.5 tablets (12.5 mg total) by mouth daily. 90 tablet 3  . empagliflozin (JARDIANCE) 10 MG TABS tablet Take 10 mg by mouth daily. 30 tablet 6   No current facility-administered medications for this encounter.    BP (!) 102/56   Pulse 98   Wt 132.6 kg (292 lb 6.4 oz)   SpO2 97%   BMI 45.80 kg/m  General: Obese, NAD Neck: No JVD, no thyromegaly or thyroid nodule.  Lungs: Clear to auscultation bilaterally with normal respiratory effort. CV: Nondisplaced PMI.  Heart regular S1/S2, no S3/S4, no murmur.  No peripheral edema.  No carotid bruit.  Normal pedal pulses.  Abdomen: Soft, nontender, no hepatosplenomegaly, no distention.  Skin: Intact without lesions or rashes.  Neurologic: Alert and oriented x 3.  Psych: Normal affect. Extremities: No clubbing or cyanosis.  HEENT: Normal.     Assessment/Plan: 1. Chronic systolic CHF: Echo in 3/29 with EF 25-30%.  She had occluded mid LCx and distal RCA on coronary angiography.  Suspect ischemic cardiomyopathy.  She also has a monoclonal antibody.  Cardiac MRI in 8/19 showed LV EF 32% and RV EF 30%, subendocardial scar at inferior base consistent with prior MI, no evidence for cardiac amyloidosis.  NYHA class II symptoms, she is not volume overloaded.  - Repeat echo in 11/19, if EF remains < 35%, she will need ICD.  She has a narrow QRS, not CRT candidate.  - Continue Entresto 24/26 bid.  - Continue spironolactone 12.5 mg daily.   - Increase Coreg to 6.25 mg bid.  - Add  empagliflozin 10 mg daily as blood glucose is still running high.  Will let her PCP and endocrinologist know that we have done this.   - Continue Lasix 40 mg bid. BMET today.  2. CAD: Occluded mLCx and dRCA.  Low EF somewhat out of proportion to this.  No interventional target.   - Continue ASA 81 daily.  - She cannot tolerate statins, lipid clinic is working on getting her on Zumbrota.  3. Toe ulcer: ABIs (8/19) were normal.   See HF pharmacist in 3 wks, hopefully increase spironolactone at that time.  See APP in 6 wks.   Loralie Champagne 06/10/2018

## 2018-06-10 NOTE — Patient Instructions (Addendum)
Increase Carvedilol to 6.25 mg Twice daily   Start Jardiance 10 mg daily  Lab today  Your physician has requested that you have an echocardiogram. Echocardiography is a painless test that uses sound waves to create images of your heart. It provides your doctor with information about the size and shape of your heart and how well your heart's chambers and valves are working. This procedure takes approximately one hour. There are no restrictions for this procedure.  Please follow up with Doroteo Bradford, our heart failure pharmacist in 3 weeks  Your physician recommends that you schedule a follow-up appointment in: 6 weeks with APP  Your physician recommends that you schedule a follow-up appointment in: January with Dr Aundra Dubin

## 2018-06-12 ENCOUNTER — Telehealth: Payer: Self-pay | Admitting: Pharmacist

## 2018-06-12 DIAGNOSIS — E7849 Other hyperlipidemia: Secondary | ICD-10-CM

## 2018-06-12 MED ORDER — EVOLOCUMAB 140 MG/ML ~~LOC~~ SOAJ: 1.0000 "pen " | SUBCUTANEOUS | 11 refills | Status: DC

## 2018-06-12 MED FILL — REPATHA 140 MG/1 ML INJ: 140 | 28 days supply | Qty: 2 | Fill #0

## 2018-06-12 NOTE — Telephone Encounter (Addendum)
Prior authorization for Repatha has been approved. Rx sent to [facility]. Activated copay card for pt and provided information to pharmacy. LMOM for pt.  ID: [id number] BIN: [id number] PCN: [id number] GRP: [id number]

## 2018-06-13 ENCOUNTER — Other Ambulatory Visit: Payer: Self-pay

## 2018-06-13 ENCOUNTER — Ambulatory Visit (HOSPITAL_COMMUNITY): Payer: 59 | Attending: Cardiovascular Disease

## 2018-06-13 DIAGNOSIS — I5022 Chronic systolic (congestive) heart failure: Secondary | ICD-10-CM | POA: Diagnosis not present

## 2018-06-13 MED ORDER — PERFLUTREN LIPID MICROSPHERE
1.0000 mL | INTRAVENOUS | Status: AC | PRN
  Administered 2018-06-13: 1 mL via INTRAVENOUS

## 2018-06-13 NOTE — Telephone Encounter (Signed)
LMOM to follow up 

## 2018-06-14 MED FILL — FUROSEMIDE 40 MG TAB: 40 | 90 days supply | Qty: 180 | Fill #1

## 2018-06-14 NOTE — Telephone Encounter (Signed)
LMOM to follow up 

## 2018-06-17 NOTE — Addendum Note (Signed)
Addended by: Erskine Emery on: 06/17/2018 04:04 PM   Modules accepted: Orders

## 2018-06-17 NOTE — Telephone Encounter (Signed)
Spoke with patient who started Repatha on Friday. She reports that it has been uneventful as of yet. Labs ordered for after 4th dose. She is aware to call with any issues.

## 2018-06-18 ENCOUNTER — Telehealth (HOSPITAL_COMMUNITY): Payer: Self-pay | Admitting: Surgery

## 2018-06-18 DIAGNOSIS — I5022 Chronic systolic (congestive) heart failure: Secondary | ICD-10-CM

## 2018-06-18 NOTE — Telephone Encounter (Signed)
Patient contacted and is agreeable to EP referral for possible ICD implantation.  I have placed amb referral to EP.

## 2018-06-21 MED FILL — CARVEDILOL 6.25 MG TABLET: 6.25 | 30 days supply | Qty: 60 | Fill #0

## 2018-06-24 NOTE — Progress Notes (Deleted)
No chief complaint on file.   HPI   Chronic Systolic Heart Failure Pt is current on Repatha for her lipid as she does not tolerate statins She is under the car of cardiology for her heart failure She is on Spironolactor 12.5mg  with plans by Cardiology to titrate up on her dose She is on Entresto 24/26 Coreg 6.25mg  bid Lasix 40mg  BID   She ***reports that her weight has been stable and denies PND Wt Readings from Last 3 Encounters:  06/10/18 292 lb 6.4 oz (132.6 kg)  04/30/18 290 lb (131.5 kg)  04/18/18 294 lb 12.8 oz (133.7 kg)   The 10-year ASCVD risk score Mikey Bussing DC Jr., et al., 2013) is: 7.2%   Values used to calculate the score:     Age: 62 years     Sex: Female     Is Non-Hispanic African American: No     Diabetic: Yes     Tobacco smoker: No     Systolic Blood Pressure: 132 mmHg     Is BP treated: Yes     HDL Cholesterol: 57 mg/dL     Total Cholesterol: 266 mg/dL   Diabetes Pt has been started on empagliflozin 10mg  to go with her Metformin, Treisba 44W daily, Trulicity 1.5mg  SQ weekly She previously tried [place] which she did not tolerate very well  Lab Results  Component Value Date   HGBA1C 11.9 (A) 04/18/2018   Hypothyroidism Lab Results  Component Value Date   TSH 2.480 03/14/2018   She reports that she is ***complaint with her levothyroxine  She takes levothyroxine 50 mcg   Past Medical History:  Diagnosis Date  . Allergy   . Bilateral shoulder pain   . Borderline hypertension   . Bronchitis   . Diabetes mellitus    Diagnosed in 2000  . Hyperlipidemia   . Obesity   . Sleep apnea     Current Outpatient Medications  Medication Sig Dispense Refill  . albuterol (PROAIR HFA) 108 (90 Base) MCG/ACT inhaler Inhale 2 puffs into the lungs every 6 (six) hours as needed. wheezing (Patient taking differently: Inhale 2 puffs into the lungs every 6 (six) hours as needed for wheezing or shortness of breath. ) 18 g 2  . aspirin 81 MG tablet Take 1 tablet  (81 mg total) by mouth daily. 30 tablet 3  . bismuth subsalicylate (PEPTO BISMOL) 262 MG chewable tablet Chew 524 mg by mouth as needed for indigestion or diarrhea or loose stools.     . carvedilol (COREG) 6.25 MG tablet Take 1 tablet (6.25 mg total) by mouth 2 (two) times daily. 60 tablet 2  . cetirizine (ZYRTEC) 10 MG tablet Take 10 mg by mouth daily.    . diclofenac sodium (VOLTAREN) 1 % GEL Apply 2 g topically 4 (four) times daily. (Patient taking differently: Apply 2 g topically 4 (four) times daily as needed (for pain). ) 100 g 1  . Dulaglutide (TRULICITY) 1.5 NU/2.7OZ SOPN Inject into the skin once a week. 4 pen 1  . empagliflozin (JARDIANCE) 10 MG TABS tablet Take 10 mg by mouth daily. 30 tablet 6  . Evolocumab (REPATHA SURECLICK) 366 MG/ML SOAJ Inject 1 pen into the skin every 14 (fourteen) days. 2 pen 11  . fluticasone (FLONASE) 50 MCG/ACT nasal spray Place 2 sprays into both nostrils daily as needed for allergies or rhinitis.    . furosemide (LASIX) 40 MG tablet Take 1 tablet (40 mg total) by mouth 2 (two) times daily. 180 tablet  3  . Insulin Degludec (TRESIBA FLEXTOUCH) 200 UNIT/ML SOPN Inject 18 Units into the skin daily. 3 pen 5  . Insulin Pen Needle 32G X 4 MM MISC Use 2x a day 200 each 3  . levothyroxine (SYNTHROID) 50 MCG tablet Take 1 tablet (50 mcg total) by mouth daily before breakfast. 90 tablet 3  . magnesium oxide (MAG-OX) 400 MG tablet Take 400 mg by mouth at bedtime as needed (for cramps).    . metFORMIN (GLUCOPHAGE) 1000 MG tablet Take 1 tablet (1,000 mg total) by mouth 2 (two) times daily with a meal. 180 tablet 3  . Multiple Vitamin (MULITIVITAMIN WITH MINERALS) TABS Take 1 tablet by mouth daily with breakfast.    . sacubitril-valsartan (ENTRESTO) 24-26 MG Take 1 tablet by mouth 2 (two) times daily. 60 tablet 11  . spironolactone (ALDACTONE) 25 MG tablet Take 0.5 tablets (12.5 mg total) by mouth daily. 90 tablet 3   No current facility-administered medications for this  visit.     Allergies:  Allergies  Allergen Reactions  . Adhesive [Tape] Rash  . Exenatide Other (See Comments)    Flu-like symptoms  . Invokana [Canagliflozin] Other (See Comments)    Yeast infection/dehydration  . Lisinopril Other (See Comments)    cramping  . Other Diarrhea and Other (See Comments)    Kale=food  Per patient, it causes GI bleeding   . Statins Other (See Comments)    Leg cramps    Past Surgical History:  Procedure Laterality Date  . BREATH TEK H PYLORI  09/18/2011   Procedure: BREATH TEK H PYLORI;  Surgeon: Shann Medal, MD;  Location: [facility];  Service: General;  Laterality: N/A;  . CESAREAN SECTION     x 3  . LAPAROSCOPIC APPENDECTOMY N/A 10/03/2017   Procedure: APPENDECTOMY LAPAROSCOPIC;  Surgeon: Leighton Ruff, MD;  Location: [facility];  Service: General;  Laterality: N/A;  . RIGHT/LEFT HEART CATH AND CORONARY ANGIOGRAPHY N/A 02/12/2018   Procedure: RIGHT/LEFT HEART CATH AND CORONARY ANGIOGRAPHY;  Surgeon: Jettie Booze, MD;  Location: [facility];  Service: Cardiovascular;  Laterality: N/A;    Social History   Socioeconomic History  . Marital status: Married    Spouse name: Not on file  . Number of children: Not on file  . Years of education: Not on file  . Highest education level: Not on file  Occupational History  . Not on file  Social Needs  . Financial resource strain: Not on file  . Food insecurity:    Worry: Not on file    Inability: Not on file  . Transportation needs:    Medical: Not on file    Non-medical: Not on file  Tobacco Use  . Smoking status: Former Smoker    Packs/day: 1.00    Years: 15.00    Pack years: 15.00    Types: Cigarettes    Last attempt to quit: 01/28/1990    Years since quitting: 28.4  . Smokeless tobacco: Never Used  Substance and Sexual Activity  . Alcohol use: No  . Drug use: No  . Sexual activity: Not Currently  Lifestyle  . Physical activity:    Days per week: Not on file    Minutes  per session: Not on file  . Stress: Not on file  Relationships  . Social connections:    Talks on phone: Not on file    Gets together: Not on file    Attends religious service: Not on file  Active member of club or organization: Not on file    Attends meetings of clubs or organizations: Not on file    Relationship status: Not on file  Other Topics Concern  . Not on file  Social History Narrative  . Not on file    Family History  Problem Relation Age of Onset  . Alcohol abuse Mother   . Arthritis Mother   . Diabetes Mother   . Sleep apnea Mother   . Alcohol abuse Father   . Heart disease Father   . Sleep apnea Sister      ROS Review of Systems See HPI Constitution: No fevers or chills No malaise No diaphoresis Skin: No rash or itching Eyes: no blurry vision, no double vision GU: no dysuria or hematuria Neuro: no dizziness or headaches ***all others reviewed and negative   Objective: There were no vitals filed for this visit.  Physical Exam     Study Conclusions  - Procedure narrative: Transthoracic echocardiography. Image   quality was adequate. Intravenous contrast (Definity) was   administered. - Left ventricle: The cavity size was mildly dilated. Wall   thickness was normal. Systolic function was moderately to   severely reduced. The estimated ejection fraction was in the   range of 30% to 35%. Moderate diffuse hypokinesis with no   identifiable regional variations. Doppler parameters are   consistent with abnormal left ventricular relaxation (grade 1   diastolic dysfunction). - Mitral valve: There was moderate regurgitation directed   centrally. - Left atrium: The atrium was mildly dilated. - Right ventricle: The cavity size was moderately dilated. Systolic   function was mildly reduced. - Pulmonary arteries: Systolic pressure was mildly increased. PA   peak pressure: 37 mm Hg (S).     Assessment and Plan There are no diagnoses linked to  this encounter.   [place]

## 2018-06-26 ENCOUNTER — Ambulatory Visit: Payer: 59 | Admitting: Family Medicine

## 2018-06-28 DIAGNOSIS — E7849 Other hyperlipidemia: Secondary | ICD-10-CM

## 2018-07-01 MED FILL — ASPIRIN ADULT LOW STRENGTH: 81 | 30 days supply | Qty: 30 | Fill #3

## 2018-07-01 MED FILL — TRESIBA FLEXTOUCH 200 UNITS: 200 | 33 days supply | Qty: 3 | Fill #2

## 2018-07-01 MED FILL — ENTRESTO 24 MG-26 MG TABLET: 24-26 | 30 days supply | Qty: 60 | Fill #4

## 2018-07-01 MED FILL — TRULICITY 1.5 MG/0.5 ML PEN: 1.5 | 28 days supply | Qty: 2 | Fill #1

## 2018-07-09 ENCOUNTER — Institutional Professional Consult (permissible substitution): Payer: 59 | Admitting: Cardiology

## 2018-07-10 MED FILL — REPATHA 140 MG/1 ML INJ: 140 | 28 days supply | Qty: 2 | Fill #1

## 2018-07-11 ENCOUNTER — Ambulatory Visit (HOSPITAL_COMMUNITY)
Admission: RE | Admit: 2018-07-11 | Discharge: 2018-07-11 | Disposition: A | Discharge status: Home / Self Care | Payer: 59 | Source: Ambulatory Visit | Attending: Cardiology | Admitting: Cardiology

## 2018-07-11 DIAGNOSIS — I2510 Atherosclerotic heart disease of native coronary artery without angina pectoris: Secondary | ICD-10-CM | POA: Diagnosis not present

## 2018-07-11 DIAGNOSIS — Z79899 Other long term (current) drug therapy: Secondary | ICD-10-CM | POA: Diagnosis not present

## 2018-07-11 DIAGNOSIS — D472 Monoclonal gammopathy: Secondary | ICD-10-CM | POA: Diagnosis not present

## 2018-07-11 DIAGNOSIS — L97509 Non-pressure chronic ulcer of other part of unspecified foot with unspecified severity: Secondary | ICD-10-CM | POA: Diagnosis not present

## 2018-07-11 DIAGNOSIS — I5022 Chronic systolic (congestive) heart failure: Secondary | ICD-10-CM | POA: Insufficient documentation

## 2018-07-11 MED ORDER — CARVEDILOL 12.5 MG PO TABS: 12.5000 mg | ORAL_TABLET | Freq: Two times a day (BID) | ORAL | 5 refills | Status: DC

## 2018-07-11 MED FILL — CARVEDILOL 12.5 MG TABLET: 12.5 | 30 days supply | Qty: 60 | Fill #0

## 2018-07-11 NOTE — Patient Instructions (Signed)
It was nice to see you today!  Please INCREASE you Carvedilol to 12.5 mg TWICE DAILY. You can take two of your 6.25 mg tablets TWICE DAILY until you run out.  Please keep your appointment with the nurse practitioners on 07/22/2018.

## 2018-07-11 NOTE — Progress Notes (Signed)
HF MD: [facility]  HPI:  62 yo with history of chronic systolic CHF, CAD, and MGUS was referred by Dr. Irish Lack for evaluation of CHF. Patient first developed exertional dyspnea after her appendectomy in 2/19. By 5/19, she was gaining weight and had developed a chronic cough. She never had chest pain. Echo was done in 5/19 showing EF 25-30%, moderate MR, moderate RV dilation. RHC/LHC was done in 6/19 showing elevated filling pressures, preserved cardiac output, and occluded mid LCx and distal RCA. She was found to have IgM monoclonal antibody. Skeletal survey showed no lytic lesions, likely MGUS. Cardiac MRI in 8/19 showed EF 32%, subendocardial scar (consistent with prior MI) at inferior base, moderately dilated RV with RV EF 20%. No evidence for cardiac amyloidosis by MRI.   She returns today for pharmacist-led HF medication titration. At last clinic visit on 06/10/18, carvedilol was increased to 6.25 mg BID. Breathing is better on Entresto. Patient has not needed to use her albuterol inhaler. No dyspnea walking up stairs or walking through [employer]. No lightheadedness or dizziness.  She did fall while doing yard work this past weekend, but she reports that it was not related to any dizziness or hypotension. She continues to deny dyspnea, lightheadedness/dizziness, and orthopnea/PND. She denies any swelling and wears CPAP every night. Endorses good UOP with lasix.   Shortness of breath/dyspnea on exertion? no   Orthopnea/PND? no  Edema? no  Lightheadedness/dizziness? no  Daily weights at home? Yes (stable 291-295 lbs)  Blood pressure/heart rate monitoring at home? Yes - not regularly (usually 106/73)  Following low-sodium/fluid-restricted diet? yes  HF Medications: Carvedilol 6.25 mg BID Furosemide 40 mg BID Entresto 24-26 mg BID Spironolactone 12.5 mg daily  Has the patient been experiencing any side effects to the medications prescribed?  no  Does the patient have any  problems obtaining medications due to transportation or finances?   No - [employer]  Understanding of regimen: good Understanding of indications: good Potential of compliance: good Patient understands to avoid NSAIDs. Patient understands to avoid decongestants.   Pertinent Lab Values:  06/10/18: Serum creatinine 0.83, BUN 16, Potassium 4.4, Sodium 138  Vital Signs:  Weight: 296 lb (dry weight: 290 lb)  Blood pressure: 122/84  Heart rate: 90 bpm  O2: 99%  Assessment: 1. Chronicsystolic CHF (EF 36-46%>>80-32%), due to ICM. NYHA class IIsymptoms.  - Volume status stable   - Increase carvedilol to 12.5 mg BID  - Continue Entresto 24/26 BID, Spironolactone 12.5 mg BID, Lasix 40 mg BID  - Has appointment with EP this month to discuss ICD - Basic disease state pathophysiology, medication indication, mechanism and side effects reviewed at length with patient and he verbalized understanding  2. CAD: Occluded mLCx and dRCA. Low EF somewhat out of proportion to this. No interventional target.  -Continue ASA 81 daily. - Continue Repatha. Next (third) dose tomorrow. - She cannot tolerate statins,lipid clinic is following   3.Toe ulcer: ABIs (8/19) were normal.   Plan: 1) Medication changes: Based on clinical presentation, vital signs and recent labs will increase carvedilol 12.5 mg BID 2) Labs: PRN 3) Follow-up: PA/NP 07/22/18 9:00 am   Jazell Rosenau K. Velva Harman, PharmD, BCPS, CPP Clinical Pharmacist Phone: [phone] 07/10/2018 3:45 PM

## 2018-07-12 MED FILL — metFORMIN HCL 1000 MG TABS: 1000 | 90 days supply | Qty: 180 | Fill #1

## 2018-07-16 NOTE — Progress Notes (Signed)
PCP: Terri Moron, MD  Cardiology: Terri. Irish Heath HF Cardiology: Terri. Terrall Heath is a 62 y.o. female with history of chronic systolic CHF, CAD, and MGUS was referred by Terri. Irish Heath for evaluation of CHF.  Patient first developed exertional dyspnea after her appendectomy in 2/19.  By 5/19, she was gaining weight and had developed a chronic cough. She never had chest pain.  Echo was done in 5/19 showing EF 25-30%, moderate MR, moderate RV dilation.  RHC/LHC was done in 6/19 showing elevated filling pressures, preserved cardiac output, and occluded mid LCx and distal RCA.  She was found to have IgM monoclonal antibody.  Skeletal survey showed no lytic lesions, likely MGUS.  Cardiac MRI in 8/19 showed EF 32%, subendocardial scar (consistent with prior MI) at inferior base, moderately dilated RV with RV EF 20%. No evidence for cardiac amyloidosis by MRI.   She returns today for HF follow up. Last saw HF pharmacist and coreg increased to 12.5 mg BID. She was started on jardiance at last visit with Terri Heath. Saw Terri Terri Heath 11/20 and has declined ICD. He started her on amoxicillin for ear infection. Overall doing well. She was getting nauseated after taking amoxicillin, so is no longer taking. Ear infection much better. Denies SOB as long as she goes slow. Denies orthopnea, PND, or edema. Wears CPAP qHS. No CP.  Having dizziness when moving head back and forth since she has had ear infection, none with standing. Appetite and energy okay. Taking all medications. She still does keto diet for the most part. Weights 290-295 lbs. 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 Sees endocrinologist early December.    Labs (5/19): transferrin saturation 20%, immunofixation with IgM monoclonal antibody.  Labs (6/19): K 4.5, creatinine 0.79 Labs (7/19): K 4.1, creatinine 0.86, TSH normal Labs (9/19): K 4.4, creatinine 0.61 Labs (10/19): LDL  187  PMH: 1. Type II diabetes 2. OSA 3. H/o appendectomy 4. 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  Skeletal survey in 7/19 showed no lytic lesions.  6. 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   - Echo (5/19): EF 25-30%, mild LV dilation, moderate MR, moderately dilated RV.  - LHC/RHC (6/19): distal RCA totally occluded, mid LCx totally occluded, EF < 25%. No intervention. Mean RA 10, mean PA 42, mean PCWP 29, CI 2.3.  - Cardiac MRI in 8/19 showed EF 32%, subendocardial scar (consistent with prior MI) at inferior base, moderately dilated RV with RV EF 20%. No evidence for cardiac amyloidosis by MRI.  7. CAD: LHC (6/19) with distal RCA totally occluded, mid LCx totally occluded, EF < 25%. No intervention. 8. Hyperlipidemia: Has not tolerated statins.  9. ABIs (8/19): Normal.   SH: Married, works as Nurse, adult, quit smoking in 1991.    FH: Mother with rheumatic MV disease, s/p MVR.   Review of systems complete and found to be negative unless listed in HPI.   Current Outpatient Medications  Medication Sig Dispense Refill  . aspirin 81 MG tablet Take 1 tablet (81 mg total) by mouth daily. 30 tablet 3  . carvedilol (COREG) 12.5 MG tablet Take 1 tablet (12.5 mg total) by mouth 2 (two) times daily. 60 tablet 5  . Evolocumab (REPATHA SURECLICK) 154 MG/ML SOAJ Inject 1 pen into the skin every 14 (fourteen) days. 2 pen 11  . furosemide (LASIX) 40 MG tablet Take 1 tablet (40  mg total) by mouth 2 (two) times daily. 180 tablet 3  . Insulin Degludec (TRESIBA FLEXTOUCH) 200 UNIT/ML SOPN Inject 18 Units into the skin daily. 3 pen 5  . levothyroxine (SYNTHROID) 50 MCG tablet Take 1 tablet (50 mcg total) by mouth daily before breakfast. 90 tablet 3  . metFORMIN (GLUCOPHAGE) 1000 MG tablet Take 1 tablet (1,000 mg total) by mouth 2 (two) times daily with a meal. 180 tablet 3  . sacubitril-valsartan (ENTRESTO) 24-26 MG Take 1 tablet  by mouth 2 (two) times daily. 60 tablet 11  . spironolactone (ALDACTONE) 25 MG tablet Take 0.5 tablets (12.5 mg total) by mouth daily. 90 tablet 3  . albuterol (PROAIR HFA) 108 (90 Base) MCG/ACT inhaler Inhale 2 puffs into the lungs every 6 (six) hours as needed. wheezing 18 g 2  . bismuth subsalicylate (PEPTO BISMOL) 262 MG chewable tablet Chew 524 mg by mouth as needed for indigestion or diarrhea or loose stools.     . cetirizine (ZYRTEC) 10 MG tablet Take 10 mg by mouth daily.    . diclofenac sodium (VOLTAREN) 1 % GEL Apply 2 g topically 4 (four) times daily. 100 g 1  . Dulaglutide (TRULICITY) 1.5 UE/4.5WU SOPN Inject into the skin once a week. 4 pen 1  . fluticasone (FLONASE) 50 MCG/ACT nasal spray Place 2 sprays into both nostrils daily as needed for allergies or rhinitis.    . Insulin Pen Needle 32G X 4 MM MISC Use 2x a day 200 each 3  . magnesium oxide (MAG-OX) 400 MG tablet Take 400 mg by mouth at bedtime as needed (for cramps).    . Multiple Vitamin (MULITIVITAMIN WITH MINERALS) TABS Take 1 tablet by mouth daily with breakfast.     No current facility-administered medications for this encounter.    BP 122/82   Pulse 84   Wt 133 kg (293 lb 3.2 oz)   SpO2 97%   BMI 45.92 kg/m   Wt Readings from Last 3 Encounters:  07/22/18 133 kg (293 lb 3.2 oz)  07/17/18 132.2 kg (291 lb 6.4 oz)  06/10/18 132.6 kg (292 lb 6.4 oz)   General: Obese. No resp difficulty. HEENT: Normal Neck: Supple. JVP difficult but does not appear elevated. Carotids 2+ bilat; no bruits. No thyromegaly or nodule noted. Cor: PMI nondisplaced. RRR, No M/G/R noted Lungs: CTAB, normal effort. Abdomen: Soft, non-tender, non-distended, no HSM. No bruits or masses. +BS  Extremities: No cyanosis, clubbing, or rash. R and LLE no edema. BLE TED hose on.  Neuro: Alert & orientedx3, cranial nerves grossly intact. moves all 4 extremities w/o difficulty. Affect pleasant  Assessment/Plan: 1. Chronic systolic CHF: Echo in  9/81 with EF 25-30%.  She had occluded mid LCx and distal RCA on coronary angiography.  Suspect ischemic cardiomyopathy.  She also has a monoclonal antibody.  Cardiac MRI in 8/19 showed LV EF 32% and RV EF 30%, subendocardial scar at inferior base consistent with prior MI, no evidence for cardiac amyloidosis.  NYHA class II symptoms. Volume stable.  - Echo 05/2018 EF 30-35%. Refered to EP for ICD consideration and declined ICD. - Continue Lasix 40 mg bid.  - Continue Entresto 24/26 bid.  - Increase spironolactone to 25 mg daily. BMET next week at lipid clinic.  - Continue coreg 12.5 mg BID - Unable to tolerate SGLT2's with GU infections.  2. CAD: Occluded mLCx and dRCA.  Low EF somewhat out of proportion to this.  No interventional target.   - Continue ASA 81  daily.  - She cannot tolerate statins. Now on Repatha per lipid clinic.  3. Toe ulcer: ABIs (8/19) were normal. No change.  4. DM - Follows with Endo. Unable to tolerate SGLT2s in the past with GU infections.   Increase spiro to 25 mg daily Add on BMET to labs on 12/5 at lipid clinic Keep appointment with Terri Heath 1/14 Received FMLA paperwork today.   Georgiana Shore 07/22/2018  Greater than 50% of the 25 minute visit was spent in counseling/coordination of care regarding disease state education, salt/fluid restriction, sliding scale diuretics, and medication compliance.

## 2018-07-17 ENCOUNTER — Ambulatory Visit: Payer: 59 | Admitting: Internal Medicine

## 2018-07-17 ENCOUNTER — Encounter: Payer: Self-pay | Admitting: Internal Medicine

## 2018-07-17 VITALS — BP 96/64 | HR 87 | Ht 67.0 in | Wt 291.4 lb

## 2018-07-17 DIAGNOSIS — I428 Other cardiomyopathies: Secondary | ICD-10-CM

## 2018-07-17 DIAGNOSIS — I5022 Chronic systolic (congestive) heart failure: Secondary | ICD-10-CM

## 2018-07-17 DIAGNOSIS — H66001 Acute suppurative otitis media without spontaneous rupture of ear drum, right ear: Secondary | ICD-10-CM | POA: Diagnosis not present

## 2018-07-17 MED ORDER — AMOXICILLIN 500 MG PO TABS: 500.0000 mg | ORAL_TABLET | Freq: Two times a day (BID) | ORAL | 0 refills | Status: DC

## 2018-07-17 MED FILL — AMOXICILLIN 500 MG CAPSULE: 500 | 5 days supply | Qty: 10 | Fill #0

## 2018-07-17 NOTE — Patient Instructions (Addendum)
Medication Instructions:  Your physician has recommended you make the following change in your medication:   1.  Start taking amoxicillin 500 mg--- Take one tablet by mouth twice a day for 5 days.  Labwork: None ordered.  Testing/Procedures: Your physician has recommended that you have a defibrillator inserted. An implantable cardioverter defibrillator (ICD) is a small device that is placed in your chest or, in rare cases, your abdomen. This device uses electrical pulses or shocks to help control life-threatening, irregular heartbeats that could lead the heart to suddenly stop beating (sudden cardiac arrest). Leads are attached to the ICD that goes into your heart. This is done in the hospital and usually requires an overnight stay. Please see the instruction sheet given to you today for more information.  Follow-Up:  Please call me if you decide to go ahead with procedure.  Sonia Baller RN [phone]  Any Other Special Instructions Will Be Listed Below (If Applicable).  If you need a refill on your cardiac medications before your next appointment, please call your pharmacy.   Cardioverter Defibrillator Implantation An implantable cardioverter defibrillator (ICD) is a small device that is placed under the skin in the chest or abdomen. An ICD consists of a battery, a small computer (pulse generator), and wires (leads) that go into the heart. An ICD is used to detect and correct two types of dangerous irregular heartbeats (arrhythmias):  A rapid heart rhythm (tachycardia).  An arrhythmia in which the lower chambers of the heart (ventricles) contract in an uncoordinated way (fibrillation).  When an ICD detects tachycardia, it sends a low-energy shock to the heart to restore the heartbeat to normal (cardioversion). This signal is usually painless. If cardioversion does not work or if the ICD detects fibrillation, it delivers a high-energy shock to the heart (defibrillation) to restart the heart.  This shock may feel like a strong jolt in the chest. Your health care provider may prescribe an ICD if:  You have had an arrhythmia that originated in the ventricles.  Your heart has been damaged by a disease or heart condition.  Sometimes, ICDs are programmed to act as a device called a pacemaker. Pacemakers can be used to treat a slow heartbeat (bradycardia) or tachycardia by taking over the heart rate with electrical impulses. Tell a health care provider about:  Any allergies you have.  All medicines you are taking, including vitamins, herbs, eye drops, creams, and over-the-counter medicines.  Any problems you or family members have had with anesthetic medicines.  Any blood disorders you have.  Any surgeries you have had.  Any medical conditions you have.  Whether you are pregnant or may be pregnant. What are the risks? Generally, this is a safe procedure. However, problems may occur, including:  Swelling, bleeding, or bruising.  Infection.  Blood clots.  Damage to other structures or organs, such as nerves, blood vessels, or the heart.  Allergic reactions to medicines used during the procedure.  What happens before the procedure? Staying hydrated Follow instructions from your health care provider about hydration, which may include:  Up to 2 hours before the procedure - you may continue to drink clear liquids, such as water, clear fruit juice, black coffee, and plain tea.  Eating and drinking restrictions Follow instructions from your health care provider about eating and drinking, which may include:  8 hours before the procedure - stop eating heavy meals or foods such as meat, fried foods, or fatty foods.  6 hours before the procedure - stop eating  light meals or foods, such as toast or cereal.  6 hours before the procedure - stop drinking milk or drinks that contain milk.  2 hours before the procedure - stop drinking clear liquids.  Medicine Ask your health  care provider about:  Changing or stopping your normal medicines. This is important if you take diabetes medicines or blood thinners.  Taking medicines such as aspirin and ibuprofen. These medicines can thin your blood. Do not take these medicines before your procedure if your doctor tells you not to.  Tests  You may have blood tests.  You may have a test to check the electrical signals in your heart (electrocardiogram, ECG).  You may have imaging tests, such as a chest X-ray. General instructions  For 24 hours before the procedure, stop using products that contain nicotine or tobacco, such as cigarettes and e-cigarettes. If you need help quitting, ask your health care provider.  Plan to have someone take you home from the hospital or clinic.  You may be asked to shower with a germ-killing soap. What happens during the procedure?  To reduce your risk of infection: ? Your health care team will wash or sanitize their hands. ? Your skin will be washed with soap. ? Hair may be removed from the surgical area.  Small monitors will be put on your body. They will be used to check your heart, blood pressure, and oxygen level.  An IV tube will be inserted into one of your veins.  You will be given one or more of the following: ? A medicine to help you relax (sedative). ? A medicine to numb the area (local anesthetic). ? A medicine to make you fall asleep (general anesthetic).  Leads will be guided through a blood vessel into your heart and attached to your heart muscles. Depending on the ICD, the leads may go into one ventricle or they may go into both ventricles and into an upper chamber of the heart. An X-ray machine (fluoroscope) will be usedto help guide the leads.  A small incision will be made to create a deep pocket under your skin.  The pulse generator will be placed into the pocket.  The ICD will be tested.  The incision will be closed with stitches (sutures), skin glue, or  staples.  A bandage (dressing) will be placed over the incision. This procedure may vary among health care providers and hospitals. What happens after the procedure?  Your blood pressure, heart rate, breathing rate, and blood oxygen level will be monitored often until the medicines you were given have worn off.  A chest X-ray will be taken to check that the ICD is in the right place.  You will need to stay in the hospital for 1-2 days so your health care provider can make sure your ICD is working.  Do not drive for 24 hours if you received a sedative. Ask your health care provider when it is safe for you to drive.  You may be given an identification card explaining that you have an ICD. Summary  An implantable cardioverter defibrillator (ICD) is a small device that is placed under the skin in the chest or abdomen. It is used to detect and correct dangerous irregular heartbeats (arrhythmias).  An ICD consists of a battery, a small computer (pulse generator), and wires (leads) that go into the heart.  When an ICD detects rapid heart rhythm (tachycardia), it sends a low-energy shock to the heart to restore the heartbeat to normal (  cardioversion). If cardioversion does not work or if the ICD detects uncoordinated heart contractions (fibrillation), it delivers a high-energy shock to the heart (defibrillation) to restart the heart.  You will need to stay in the hospital for 1-2 days to make sure your ICD is working. This information is not intended to replace advice given to you by your health care provider. Make sure you discuss any questions you have with your health care provider. Document Released: 05/06/2002 Document Revised: 08/23/2016 Document Reviewed: 08/23/2016 Elsevier Interactive Patient Education  2017 [employer].

## 2018-07-17 NOTE — Progress Notes (Signed)
Electrophysiology Office Note   Date:  07/17/2018   ID:  SONDI DESCH, DOB 09/03/1955, MRN [id number]  PCP:  [name], MD  Cardiologist:  Dr Irish Lack CHF:  Dr Aundra Dubin Primary Electrophysiologist: Thompson Grayer, MD    CC: CHF   History of Present Illness: Terri Heath is a 62 y.o. female who presents today for electrophysiology evaluation.   She is referred by Dr Aundra Dubin for EP consultation regarding risk statification of sudden death.  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  Cath 6/19 revealed occluded mid Lcx and distal RCA.  Cardiac MRI 8/19 revealed EF 32% with inferior MI scar.  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  She has been terated with an optimal medical regimen but continues to have NYHA Class II/III CHF. + SOB with moderate activity. She has R ear pain, sore throat and sinus pressure x 1 week.  Today, she denies symptoms of palpitations, chest pain,  orthopnea, PND, lower extremity edema, claudication, dizziness, presyncope, syncope, bleeding, or neurologic sequela. The patient is tolerating medications without difficulties and is otherwise without complaint today.   Past Medical History:  Diagnosis Date  . Allergy   . Bilateral shoulder pain   . Borderline hypertension   . Bronchitis   . Diabetes mellitus    Diagnosed in 2000  . Hyperlipidemia   . Obesity   . Sleep apnea    Past Surgical History:  Procedure Laterality Date  . BREATH TEK H PYLORI  09/18/2011   Procedure: BREATH TEK H PYLORI;  Surgeon: Shann Medal, MD;  Location: [facility];  Service: General;  Laterality: N/A;  . CESAREAN SECTION     x 3  . LAPAROSCOPIC APPENDECTOMY N/A 10/03/2017   Procedure: APPENDECTOMY LAPAROSCOPIC;  Surgeon: Leighton Ruff, MD;  Location: [facility];  Service: General;  Laterality: N/A;  . RIGHT/LEFT HEART CATH AND CORONARY ANGIOGRAPHY N/A 02/12/2018   Procedure: RIGHT/LEFT HEART CATH AND CORONARY ANGIOGRAPHY;  Surgeon: Jettie Booze, MD;  Location: [facility];  Service: Cardiovascular;  Laterality: N/A;     Current Outpatient Medications  Medication Sig Dispense Refill  . albuterol (PROAIR HFA) 108 (90 Base) MCG/ACT inhaler Inhale 2 puffs into the lungs every 6 (six) hours as needed. wheezing 18 g 2  . aspirin 81 MG tablet Take 1 tablet (81 mg total) by mouth daily. 30 tablet 3  . bismuth subsalicylate (PEPTO BISMOL) 262 MG chewable tablet Chew 524 mg by mouth as needed for indigestion or diarrhea or loose stools.     . carvedilol (COREG) 12.5 MG tablet Take 1 tablet (12.5 mg total) by mouth 2 (two) times daily. 60 tablet 5  . cetirizine (ZYRTEC) 10 MG tablet Take 10 mg by mouth daily.    . diclofenac sodium (VOLTAREN) 1 % GEL Apply 2 g topically 4 (four) times daily. (Patient taking differently: Apply 2 g topically 4 (four) times daily as needed (for pain). ) 100 g 1  . Dulaglutide (TRULICITY) 1.5 VV/6.1YW SOPN Inject into the skin once a week. 4 pen 1  . empagliflozin (JARDIANCE) 10 MG TABS tablet Take 10 mg by mouth daily. 30 tablet 6  . Evolocumab (REPATHA SURECLICK) 737 MG/ML SOAJ Inject 1 pen into the skin every 14 (fourteen) days. 2 pen 11  . fluticasone (FLONASE) 50 MCG/ACT nasal spray Place 2 sprays into both nostrils daily as needed for allergies or rhinitis.    . furosemide (LASIX) 40 MG  tablet Take 1 tablet (40 mg total) by mouth 2 (two) times daily. 180 tablet 3  . Insulin Degludec (TRESIBA FLEXTOUCH) 200 UNIT/ML SOPN Inject 18 Units into the skin daily. 3 pen 5  . Insulin Pen Needle 32G X 4 MM MISC Use 2x a day 200 each 3  . levothyroxine (SYNTHROID) 50 MCG tablet Take 1 tablet (50 mcg total) by mouth daily before breakfast. 90 tablet 3  . magnesium oxide (MAG-OX) 400 MG tablet Take 400 mg by mouth at bedtime as needed (for cramps).    . metFORMIN (GLUCOPHAGE) 1000 MG tablet Take 1 tablet (1,000 mg total) by mouth 2 (two) times daily with a meal. 180 tablet 3  . Multiple Vitamin  (MULITIVITAMIN WITH MINERALS) TABS Take 1 tablet by mouth daily with breakfast.    . sacubitril-valsartan (ENTRESTO) 24-26 MG Take 1 tablet by mouth 2 (two) times daily. 60 tablet 11  . spironolactone (ALDACTONE) 25 MG tablet Take 0.5 tablets (12.5 mg total) by mouth daily. 90 tablet 3   No current facility-administered medications for this visit.     Allergies:   Adhesive [tape]; Exenatide; Invokana [canagliflozin]; Lisinopril; Other; and Statins   Social History:  The patient  reports that she quit smoking about 28 years ago. Her smoking use included cigarettes. She has a 15.00 pack-year smoking history. She has never used smokeless tobacco. She reports that she does not drink alcohol or use drugs.   Family History:  The patient's  family history includes Alcohol abuse in her father and mother; Arthritis in her mother; Diabetes in her mother; Heart disease in her father; Sleep apnea in her mother and sister.    ROS:  Please see the history of present illness.   All other systems are personally reviewed and negative.    PHYSICAL EXAM: VS:  BP 96/64   Pulse 87   Ht 5\' 7"  (1.702 m)   Wt 291 lb 6.4 oz (132.2 kg)   SpO2 98%   BMI 45.64 kg/m  , BMI Body mass index is 45.64 kg/m. GEN: Well nourished, well developed, in no acute distress  HEENT: oropharynx is red, R ear is infected Neck: no JVD, carotid bruits, or masses Cardiac: RRR; no murmurs, rubs, or gallops,no edema  Respiratory:  clear to auscultation bilaterally, normal work of breathing GI: soft, nontender, nondistended, + BS MS: no deformity or atrophy  Skin: warm and dry  Neuro:  Strength and sensation are intact Psych: euthymic mood, full affect  EKG:  EKG is ordered today. The ekg ordered today is personally reviewed and shows sinus rhythm 87 bpm, PR 152 msec, QRS 104 msec, QTc 452 msec, inferior infarct   Recent Labs: 01/14/2018: Pro B Natriuretic peptide (BNP) 308.0 02/07/2018: Hemoglobin 15.0; Platelets  220 03/14/2018: TSH 2.480 06/06/2018: ALT 22 06/10/2018: BUN 16; Creatinine, Ser 0.83; Potassium 4.4; Sodium 138  personally reviewed   Lipid Panel     Component Value Date/Time   CHOL 266 (H) 06/06/2018 1141   TRIG 111 06/06/2018 1141   HDL 57 06/06/2018 1141   CHOLHDL 4.7 (H) 06/06/2018 1141   CHOLHDL 4.1 03/14/2016 1228   VLDL 28 03/14/2016 1228   LDLCALC 187 (H) 06/06/2018 1141   personally reviewed   Wt Readings from Last 3 Encounters:  07/17/18 291 lb 6.4 oz (132.2 kg)  06/10/18 292 lb 6.4 oz (132.6 kg)  04/30/18 290 lb (131.5 kg)      Other studies personally reviewed: Additional studies/ records that were reviewed today include:  Dr Claris Gladden notes, prior cath, MRI, echo  Review of the above records today demonstrates: as above   ASSESSMENT AND PLAN:  1.  Ischemic CM The patient has an ischemic CM (EF 30% by echo 06/13/18), NYHA Class III CHF, and CAD. She is referred by Dr Aundra Dubin for risk stratification of sudden death and consideration of ICD implantation.  At this time, she meets MADIT II/ SCD-HeFT criteria for ICD implantation for primary prevention of sudden death. QRS < 130 msec and therefore does not meet criteria for CRT.  I have had a thorough discussion with the patient reviewing options.  The patient has had opportunities to ask questions and have them answered.  Risks, benefits, alternatives to ICD implantation were discussed in detail with the patient today. At this time, she is inclined to decline ICD implantation.  She will think about this further and contact our office if she decides to proceed.  2. Sinusitis/ otitis She has clear ear infection I will start amoxicillin 1000mg  Q8hours x 5 days Follow-up with PCP if not improved  Follow-up:  Return as needed Follow-up with Dr Aundra Dubin as scheduled  Current medicines are reviewed at length with the patient today.   The patient does not have concerns regarding her medicines.  The following changes were  made today:  none  Labs/ tests ordered today include:  Orders Placed This Encounter  Procedures  . EKG 12-Lead     Signed, Thompson Grayer, MD  07/17/2018 11:08 AM     [facility] [address] [place] [address] [phone] (office) [phone] (fax)

## 2018-07-18 ENCOUNTER — Telehealth: Payer: Self-pay

## 2018-07-18 NOTE — Telephone Encounter (Signed)
Called pt regarding labs for repatha after first dose scheduled on 07/31/18

## 2018-07-22 ENCOUNTER — Other Ambulatory Visit: Payer: Self-pay

## 2018-07-22 ENCOUNTER — Ambulatory Visit (HOSPITAL_COMMUNITY)
Admission: RE | Admit: 2018-07-22 | Discharge: 2018-07-22 | Disposition: A | Discharge status: Home / Self Care | Payer: 59 | Source: Ambulatory Visit | Attending: Cardiology | Admitting: Cardiology

## 2018-07-22 ENCOUNTER — Encounter (HOSPITAL_COMMUNITY): Payer: Self-pay

## 2018-07-22 VITALS — BP 122/82 | HR 84 | Wt 293.2 lb

## 2018-07-22 DIAGNOSIS — L97509 Non-pressure chronic ulcer of other part of unspecified foot with unspecified severity: Secondary | ICD-10-CM | POA: Insufficient documentation

## 2018-07-22 DIAGNOSIS — E785 Hyperlipidemia, unspecified: Secondary | ICD-10-CM | POA: Diagnosis not present

## 2018-07-22 DIAGNOSIS — Z79890 Hormone replacement therapy (postmenopausal): Secondary | ICD-10-CM | POA: Diagnosis not present

## 2018-07-22 DIAGNOSIS — Z87891 Personal history of nicotine dependence: Secondary | ICD-10-CM | POA: Insufficient documentation

## 2018-07-22 DIAGNOSIS — E039 Hypothyroidism, unspecified: Secondary | ICD-10-CM | POA: Diagnosis not present

## 2018-07-22 DIAGNOSIS — Z7982 Long term (current) use of aspirin: Secondary | ICD-10-CM | POA: Insufficient documentation

## 2018-07-22 DIAGNOSIS — Z794 Long term (current) use of insulin: Secondary | ICD-10-CM | POA: Insufficient documentation

## 2018-07-22 DIAGNOSIS — I5022 Chronic systolic (congestive) heart failure: Secondary | ICD-10-CM | POA: Diagnosis not present

## 2018-07-22 DIAGNOSIS — I2510 Atherosclerotic heart disease of native coronary artery without angina pectoris: Secondary | ICD-10-CM | POA: Diagnosis not present

## 2018-07-22 DIAGNOSIS — Z7951 Long term (current) use of inhaled steroids: Secondary | ICD-10-CM | POA: Insufficient documentation

## 2018-07-22 DIAGNOSIS — Z79899 Other long term (current) drug therapy: Secondary | ICD-10-CM | POA: Insufficient documentation

## 2018-07-22 DIAGNOSIS — E7849 Other hyperlipidemia: Secondary | ICD-10-CM

## 2018-07-22 DIAGNOSIS — I252 Old myocardial infarction: Secondary | ICD-10-CM | POA: Diagnosis not present

## 2018-07-22 DIAGNOSIS — G4733 Obstructive sleep apnea (adult) (pediatric): Secondary | ICD-10-CM | POA: Diagnosis not present

## 2018-07-22 DIAGNOSIS — E119 Type 2 diabetes mellitus without complications: Secondary | ICD-10-CM | POA: Diagnosis not present

## 2018-07-22 MED ORDER — SPIRONOLACTONE 25 MG PO TABS: 25.0000 mg | ORAL_TABLET | Freq: Every day | ORAL | 3 refills | Status: DC

## 2018-07-22 MED FILL — SPIRONOLACTONE 25 MG TABLET: 25 | 90 days supply | Qty: 90 | Fill #0

## 2018-07-22 NOTE — Patient Instructions (Signed)
INCREASE Spironolactone to 25 mg, one tab daily  Labs needed in 1-2 weeks (ok to have done @ [facility])   Your physician recommends that you schedule a follow-up appointment in: 2 months with Dr Aundra Dubin   Do the following things EVERYDAY: 1) Weigh yourself in the morning before breakfast. Write it down and keep it in a log. 2) Take your medicines as prescribed 3) Eat low salt foods-Limit salt (sodium) to 2000 mg per day.  4) Stay as active as you can everyday 5) Limit all fluids for the day to less than 2 liters

## 2018-07-24 ENCOUNTER — Ambulatory Visit: Payer: 59 | Admitting: Family Medicine

## 2018-07-24 ENCOUNTER — Other Ambulatory Visit: Payer: Self-pay

## 2018-07-24 ENCOUNTER — Telehealth (HOSPITAL_COMMUNITY): Payer: Self-pay | Admitting: Cardiology

## 2018-07-24 ENCOUNTER — Encounter: Payer: Self-pay | Admitting: Family Medicine

## 2018-07-24 ENCOUNTER — Encounter

## 2018-07-24 VITALS — BP 117/78 | HR 94 | Temp 98.6°F | Resp 18 | Ht 66.54 in | Wt 294.1 lb

## 2018-07-24 DIAGNOSIS — I5022 Chronic systolic (congestive) heart failure: Secondary | ICD-10-CM | POA: Insufficient documentation

## 2018-07-24 DIAGNOSIS — E039 Hypothyroidism, unspecified: Secondary | ICD-10-CM | POA: Diagnosis not present

## 2018-07-24 DIAGNOSIS — I255 Ischemic cardiomyopathy: Secondary | ICD-10-CM | POA: Insufficient documentation

## 2018-07-24 DIAGNOSIS — E119 Type 2 diabetes mellitus without complications: Secondary | ICD-10-CM

## 2018-07-24 DIAGNOSIS — Z794 Long term (current) use of insulin: Secondary | ICD-10-CM | POA: Diagnosis not present

## 2018-07-24 DIAGNOSIS — Z789 Other specified health status: Secondary | ICD-10-CM | POA: Diagnosis not present

## 2018-07-24 HISTORY — DX: Chronic systolic (congestive) heart failure: I50.22

## 2018-07-24 LAB — POCT GLYCOSYLATED HEMOGLOBIN (HGB A1C): Hemoglobin A1C: 10.3 % — AB (ref 4.0–5.6)

## 2018-07-24 NOTE — Assessment & Plan Note (Signed)
EF improved slightly on repeat Echo. Pt compliant with med mgmt and cardiology recs

## 2018-07-24 NOTE — Assessment & Plan Note (Signed)
Has improved symptoms since getting levothyroxine

## 2018-07-24 NOTE — Progress Notes (Signed)
Established Patient Office Visit  Subjective:  Patient ID: Terri Heath, female    DOB: 1955/11/07  Age: 62 y.o. MRN: [id number]  CC:  Chief Complaint  Patient presents with  . Diabetes    f/u    HPI Terri Heath presents for   Acquired Hypothyroidism Patient reports that she has been on levothyroxine 109mcg since being diagnosed with hypothyroidism She states that she feels much better She states that her appetite seems much better as well.   Lab Results  Component Value Date   TSH 2.480 03/14/2018    Diabetes Mellitus: Patient presents for follow up of diabetes. Symptoms: none. Patient denies hyperglycemia, hypoglycemia , increase appetite, nausea and paresthesia of the feet.  Evaluation to date has been included: hemoglobin A1C.  Home sugars: BGs are running  consistent with Hgb A1C.  Lab Results  Component Value Date   HGBA1C 10.3 (A) 07/24/2018   Lab Results  Component Value Date   HGBA1C 10.3 (A) 07/24/2018    She sees Dr. Cruzita Lederer for Diabetes Management. She is now on [place] and trulicity  Fasting sugars are 180-250 She reports that in the mornings they are higher She reports that she gets readings in the 180s before lunch.  She keeps a sugar logs  Ischemic Cardiomyopathy She reports that she has been diagnosed with ischemic cardiomyopathy. She states that she had a heart attack and reports that she has been advised to get an ICD implant. She states that she was told that after her catheterization she should get one but she is still undecided.  She continues with Cardiology and is on aspirin, coreg, furosemide, entresto and spironolactone.  She denies chest pain, shortness of breath or palpitations.   Past Medical History:  Diagnosis Date  . Allergy   . Bilateral shoulder pain   . Borderline hypertension   . Bronchitis   . Diabetes mellitus    Diagnosed in 2000  . Hyperlipidemia   . Obesity   . Sleep apnea     Past Surgical History:    Procedure Laterality Date  . BREATH TEK H PYLORI  09/18/2011   Procedure: BREATH TEK H PYLORI;  Surgeon: Shann Medal, MD;  Location: [facility];  Service: General;  Laterality: N/A;  . CESAREAN SECTION     x 3  . LAPAROSCOPIC APPENDECTOMY N/A 10/03/2017   Procedure: APPENDECTOMY LAPAROSCOPIC;  Surgeon: Leighton Ruff, MD;  Location: [facility];  Service: General;  Laterality: N/A;  . RIGHT/LEFT HEART CATH AND CORONARY ANGIOGRAPHY N/A 02/12/2018   Procedure: RIGHT/LEFT HEART CATH AND CORONARY ANGIOGRAPHY;  Surgeon: Jettie Booze, MD;  Location: [facility];  Service: Cardiovascular;  Laterality: N/A;    Family History  Problem Relation Age of Onset  . Alcohol abuse Mother   . Arthritis Mother   . Diabetes Mother   . Sleep apnea Mother   . Alcohol abuse Father   . Heart disease Father   . Sleep apnea Sister     Social History   Socioeconomic History  . Marital status: Married    Spouse name: Not on file  . Number of children: Not on file  . Years of education: Not on file  . Highest education level: Not on file  Occupational History  . Not on file  Social Needs  . Financial resource strain: Not on file  . Food insecurity:    Worry: Not on file    Inability: Not on file  . Transportation needs:  Medical: Not on file    Non-medical: Not on file  Tobacco Use  . Smoking status: Former Smoker    Packs/day: 1.00    Years: 15.00    Pack years: 15.00    Types: Cigarettes    Last attempt to quit: 01/28/1990    Years since quitting: 28.5  . Smokeless tobacco: Never Used  Substance and Sexual Activity  . Alcohol use: No  . Drug use: No  . Sexual activity: Not Currently  Lifestyle  . Physical activity:    Days per week: Not on file    Minutes per session: Not on file  . Stress: Not on file  Relationships  . Social connections:    Talks on phone: Not on file    Gets together: Not on file    Attends religious service: Not on file    Active member of club or  organization: Not on file    Attends meetings of clubs or organizations: Not on file    Relationship status: Not on file  . Intimate partner violence:    Fear of current or ex partner: Not on file    Emotionally abused: Not on file    Physically abused: Not on file    Forced sexual activity: Not on file  Other Topics Concern  . 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 albuterol (PROAIR HFA) 108 (90 Base) MCG/ACT inhaler Inhale 2 puffs into the lungs every 6 (six) hours as needed. wheezing 18 g 2  . aspirin 81 MG tablet Take 1 tablet (81 mg total) by mouth daily. 30 tablet 3  . bismuth subsalicylate (PEPTO BISMOL) 262 MG chewable tablet Chew 524 mg by mouth as needed for indigestion or diarrhea or loose stools.     . carvedilol (COREG) 12.5 MG tablet Take 1 tablet (12.5 mg total) by mouth 2 (two) times daily. 60 tablet 5  . cetirizine (ZYRTEC) 10 MG tablet Take 10 mg by mouth daily.    . Dulaglutide (TRULICITY) 1.5 KV/4.2VZ SOPN Inject into the skin once a week. 4 pen 1  . Evolocumab (REPATHA SURECLICK) 563 MG/ML SOAJ Inject 1 pen into the skin every 14 (fourteen) days. 2 pen 11  . fluticasone (FLONASE) 50 MCG/ACT nasal spray Place 2 sprays into both nostrils daily as needed for allergies or rhinitis.    . furosemide (LASIX) 40 MG tablet Take 1 tablet (40 mg total) by mouth 2 (two) times daily. 180 tablet 3  . Insulin Degludec (TRESIBA FLEXTOUCH) 200 UNIT/ML SOPN Inject 18 Units into the skin daily. 3 pen 5  . Insulin Pen Needle 32G X 4 MM MISC Use 2x a day 200 each 3  . levothyroxine (SYNTHROID) 50 MCG tablet Take 1 tablet (50 mcg total) by mouth daily before breakfast. 90 tablet 3  . magnesium oxide (MAG-OX) 400 MG tablet Take 400 mg by mouth at bedtime as needed (for cramps).    . metFORMIN (GLUCOPHAGE) 1000 MG tablet Take 1 tablet (1,000 mg  total) by mouth 2 (two) times daily with a meal. 180 tablet 3  . Multiple Vitamin (MULITIVITAMIN WITH MINERALS) TABS Take 1 tablet by mouth daily with breakfast.    . sacubitril-valsartan (ENTRESTO) 24-26 MG Take 1 tablet by mouth 2 (two) times daily. 60 tablet 11  . spironolactone (ALDACTONE) 25 MG tablet  Take 1 tablet (25 mg total) by mouth daily. 90 tablet 3  . diclofenac sodium (VOLTAREN) 1 % GEL Apply 2 g topically 4 (four) times daily. 100 g 1   No facility-administered medications prior to visit.     Allergies  Allergen Reactions  . Amoxicillin Other (See Comments)    Dizziness, abdominal pain  . Adhesive [Tape] Rash  . Exenatide Other (See Comments)    Flu-like symptoms  . Invokana [Canagliflozin] Other (See Comments)    Yeast infection/dehydration  . Lisinopril Other (See Comments)    cramping  . Other Diarrhea and Other (See Comments)    Kale=food  Per patient, it causes GI bleeding   . Statins Other (See Comments)    Leg cramps    ROS Review of Systems  Review of Systems  Constitutional: Negative for activity change, appetite change, chills and fever.  HENT: Negative for congestion, nosebleeds, trouble swallowing and voice change.   Respiratory: Negative for cough, shortness of breath and wheezing.   Gastrointestinal: Negative for diarrhea, nausea and vomiting.  Genitourinary: Negative for difficulty urinating, dysuria, flank pain and hematuria.  Musculoskeletal: Negative for back pain, joint swelling and neck pain.  Neurological: Negative for dizziness, speech difficulty, light-headedness and numbness.  See HPI. All other review of systems negative.     Objective:    BP 117/78   Pulse 94   Temp 98.6 F (37 C) (Oral)   Resp 18   Ht 5' 6.54" (1.69 m)   Wt 294 lb 1.6 oz (133.4 kg)   SpO2 96%   BMI 46.71 kg/m  Wt Readings from Last 3 Encounters:  07/24/18 294 lb 1.6 oz (133.4 kg)  07/22/18 293 lb 3.2 oz (133 kg)  07/17/18 291 lb 6.4 oz (132.2 kg)     Physical Exam  Physical Exam  Constitutional: She is oriented to person, place, and time. She appears well-developed and well-nourished. She has a morbidly obese body habitus.  HENT:  Head: Normocephalic and atraumatic.  Eyes: Conjunctivae and EOM are normal.  Ears: TM clear on the right, on the left there is some increased clear fluid Cardiovascular: Normal rate, regular rhythm, normal heart sounds and intact distal pulses.  No murmur heard. Pulmonary/Chest: Effort normal and breath sounds normal. No stridor. No respiratory distress. He has no wheezes.  Neurological: He is alert and oriented to person, place, and time.  Skin: Skin is warm. Capillary refill takes less than 2 seconds.  Psychiatric: He has a normal mood and affect. His behavior is normal. Judgment and thought content normal.     RIGHT/LEFT HEART CATH AND CORONARY ANGIOGRAPHY  June 2019  Conclusion     Dist RCA lesion is 100% stenosed. Left to right collaterals  Mid Cx lesion is 100% stenosed.  Ost 2nd Diag to 2nd Diag lesion is 25% stenosed.  Mid LAD lesion is 25% stenosed.  There is severe left ventricular systolic dysfunction.  LV end diastolic pressure is moderately elevated.  The left ventricular ejection fraction is less than 25% by visual estimate.  There is no aortic valve stenosis.  Hemodynamic findings consistent with moderate pulmonary hypertension.  CO 5.5 L/min; CI 2.3; mean PA sat 42 mm Hg; mean PCWP 29 mm Hg; Ao sat 97%; PA sat 69%   CAD does not explain extent of cardiomyopathy.  Will continue medical therapy.  Will refer to heart failure clinic.    After a few days, will increase diuretic to 40 mg BID.  Health Maintenance Due  Topic Date Due  . Hepatitis C Screening  10-30-1955  . HIV Screening  02/08/1972  . COLONOSCOPY  02/08/2007  . PAP SMEAR  08/25/2008  . TETANUS/TDAP  08/26/2015  . URINE MICROALBUMIN  03/14/2017  . MAMMOGRAM  10/26/2017  . OPHTHALMOLOGY EXAM   11/14/2017  . FOOT EXAM  01/18/2018    There are no preventive care reminders to display for this patient.  Lab Results  Component Value Date   TSH 2.480 03/14/2018   Lab Results  Component Value Date   WBC 7.6 02/07/2018   HGB 15.0 02/07/2018   HCT 47.3 (H) 02/07/2018   MCV 83 02/07/2018   PLT 220 02/07/2018   Lab Results  Component Value Date   NA 138 06/10/2018   K 4.4 06/10/2018   CO2 28 06/10/2018   GLUCOSE 223 (H) 06/10/2018   BUN 16 06/10/2018   CREATININE 0.83 06/10/2018   BILITOT 0.4 06/06/2018   ALKPHOS 82 06/06/2018   AST 16 06/06/2018   ALT 22 06/06/2018   PROT 7.4 06/06/2018   ALBUMIN 3.9 06/06/2018   CALCIUM 9.1 06/10/2018   ANIONGAP 11 06/10/2018   GFR 78.66 01/14/2018   Lab Results  Component Value Date   CHOL 266 (H) 06/06/2018   Lab Results  Component Value Date   HDL 57 06/06/2018   Lab Results  Component Value Date   LDLCALC 187 (H) 06/06/2018   Lab Results  Component Value Date   TRIG 111 06/06/2018   Lab Results  Component Value Date   CHOLHDL 4.7 (H) 06/06/2018   Lab Results  Component Value Date   HGBA1C 10.3 (A) 07/24/2018      Assessment & Plan:   Problem List Items Addressed This Visit      Cardiovascular and Mediastinum   Chronic systolic congestive heart failure, NYHA class 3 (HCC)   Chronic systolic congestive heart failure (HCC)   Cardiomyopathy, ischemic    EF improved slightly on repeat Echo. Pt compliant with med mgmt and cardiology recs        Endocrine   Diabetes mellitus type 2, insulin dependent ([name]) - Primary    She is followed by Endocrinology. Her a1c improved. She is taking much more ownership and is more positive about her diabetes.       Relevant Orders   POCT glycosylated hemoglobin (Hb A1C) (Completed)   Acquired hypothyroidism    Has improved symptoms since getting levothyroxine         No orders of the defined types were placed in this encounter.   Follow-up: Return in about 6  months (around 01/22/2019) for chronic medication management.   A total of 40 minutes were spent face-to-face with the patient during this encounter and over half of that time was spent on counseling and coordination of care. Discussed the multiple speciality visit and reviewed the evolution of the disease.  Reviewed cath and echo Reviewed and discussed that she should increase her insulin as instructed Discussed quality of life Discussed her thanksgiving plans and how to best manage eating around the holidays  [name], MD

## 2018-07-24 NOTE — Telephone Encounter (Signed)
[employer] forms completed Copy to be scanned in patients chart and original left in the front office

## 2018-07-24 NOTE — Assessment & Plan Note (Signed)
She is followed by Endocrinology. Her a1c improved. She is taking much more ownership and is more positive about her diabetes.

## 2018-07-24 NOTE — Patient Instructions (Signed)
° ° ° °  If you have lab work done today you will be contacted with your lab results within the next 2 weeks.  If you have not heard from us then please contact us. The fastest way to get your results is to register for My Chart. ° ° °IF you received an x-ray today, you will receive an invoice from [facility]. Please contact [facility] at [phone] with questions or concerns regarding your invoice.  ° °IF you received labwork today, you will receive an invoice from [facility]. Please contact [facility] at [phone] with questions or concerns regarding your invoice.  ° °Our billing staff will not be able to assist you with questions regarding bills from these companies. ° °You will be contacted with the lab results as soon as they are available. The fastest way to get your results is to activate your My Chart account. Instructions are located on the last page of this paperwork. If you have not heard from us regarding the results in 2 weeks, please contact this office. °  ° ° ° °

## 2018-07-29 ENCOUNTER — Other Ambulatory Visit: Payer: Self-pay | Admitting: Internal Medicine

## 2018-07-29 ENCOUNTER — Other Ambulatory Visit (HOSPITAL_COMMUNITY): Payer: Self-pay | Admitting: Cardiology

## 2018-07-29 MED FILL — TRULICITY 1.5 MG/0.5 ML PEN: 1.5 | 28 days supply | Qty: 2 | Fill #0

## 2018-07-29 MED FILL — LEVOTHYROXINE 50 MCG TABLET: 50 | 90 days supply | Qty: 90 | Fill #2

## 2018-07-29 MED FILL — ASPIRIN ADULT LOW STRENGTH: 81 | 30 days supply | Qty: 30 | Fill #0

## 2018-07-29 MED FILL — ENTRESTO 24 MG-26 MG TABLET: 24-26 | 30 days supply | Qty: 60 | Fill #5

## 2018-07-29 MED FILL — TRESIBA FLEXTOUCH 200 UNITS: 200 | 33 days supply | Qty: 3 | Fill #3

## 2018-07-31 ENCOUNTER — Other Ambulatory Visit: Payer: 59 | Admitting: *Deleted

## 2018-07-31 DIAGNOSIS — E7849 Other hyperlipidemia: Secondary | ICD-10-CM | POA: Diagnosis not present

## 2018-07-31 LAB — LIPID PANEL
CHOLESTEROL TOTAL: 169 mg/dL (ref 100–199)
Chol/HDL Ratio: 2.8 ratio (ref 0.0–4.4)
HDL: 60 mg/dL (ref >39)
LDL CALC: 82 mg/dL (ref 0–99)
Triglycerides: 133 mg/dL (ref 0–149)
VLDL CHOLESTEROL CAL: 27 mg/dL (ref 5–40)

## 2018-07-31 LAB — HEPATIC FUNCTION PANEL
ALBUMIN: 3.8 g/dL (ref 3.6–4.8)
ALT: 19 IU/L (ref 0–32)
AST: 14 IU/L (ref 0–40)
Alkaline Phosphatase: 92 IU/L (ref 39–117)
Bilirubin Total: 0.2 mg/dL (ref 0.0–1.2)
Bilirubin, Direct: 0.09 mg/dL (ref 0.00–0.40)
Total Protein: 7 g/dL (ref 6.0–8.5)

## 2018-08-01 ENCOUNTER — Other Ambulatory Visit: Payer: 59

## 2018-08-02 ENCOUNTER — Telehealth: Payer: Self-pay | Admitting: Pharmacist

## 2018-08-02 MED ORDER — EVOLOCUMAB 140 MG/ML ~~LOC~~ SOAJ: 1.0000 "pen " | SUBCUTANEOUS | 11 refills | Status: DC

## 2018-08-02 NOTE — Telephone Encounter (Signed)
-----   Message from Cleon Gustin, RN sent at 08/01/2018  6:17 PM EST ----- The patient has been notified of the result and verbalized understanding.  All questions (if any) were answered. Patient needs refills for Repatha. Will send to PharmD.  Cleon Gustin, RN 08/01/2018 6:16 PM

## 2018-08-05 ENCOUNTER — Ambulatory Visit: Payer: 59 | Admitting: Internal Medicine

## 2018-08-05 ENCOUNTER — Encounter: Payer: Self-pay | Admitting: Internal Medicine

## 2018-08-19 ENCOUNTER — Other Ambulatory Visit: Payer: Self-pay | Admitting: Internal Medicine

## 2018-08-19 MED FILL — REPATHA 140 MG/1 ML INJ: 140 | 28 days supply | Qty: 2 | Fill #0

## 2018-08-19 MED FILL — CARVEDILOL 12.5 MG TABLET: 12.5 | 30 days supply | Qty: 60 | Fill #1

## 2018-08-20 MED FILL — TRULICITY 1.5 MG/0.5 ML PEN: 1.5 | 28 days supply | Qty: 2 | Fill #0

## 2018-08-26 MED FILL — SM BLOOD PRESSURE MONITOR: 30 days supply | Qty: 1 | Fill #0

## 2018-08-26 MED FILL — ASPIRIN ADULT LOW STRENGTH: 81 | 30 days supply | Qty: 30 | Fill #1

## 2018-08-26 MED FILL — ENTRESTO 24 MG-26 MG TABLET: 24-26 | 30 days supply | Qty: 60 | Fill #6

## 2018-08-27 DIAGNOSIS — G4733 Obstructive sleep apnea (adult) (pediatric): Secondary | ICD-10-CM | POA: Diagnosis not present

## 2018-09-05 ENCOUNTER — Inpatient Hospital Stay: Payer: 59 | Attending: Hematology and Oncology

## 2018-09-05 DIAGNOSIS — I10 Essential (primary) hypertension: Secondary | ICD-10-CM

## 2018-09-05 DIAGNOSIS — Z7982 Long term (current) use of aspirin: Secondary | ICD-10-CM | POA: Insufficient documentation

## 2018-09-05 DIAGNOSIS — D472 Monoclonal gammopathy: Secondary | ICD-10-CM | POA: Diagnosis not present

## 2018-09-05 DIAGNOSIS — E119 Type 2 diabetes mellitus without complications: Secondary | ICD-10-CM | POA: Insufficient documentation

## 2018-09-05 DIAGNOSIS — Z79899 Other long term (current) drug therapy: Secondary | ICD-10-CM | POA: Insufficient documentation

## 2018-09-05 DIAGNOSIS — E785 Hyperlipidemia, unspecified: Secondary | ICD-10-CM

## 2018-09-05 DIAGNOSIS — E118 Type 2 diabetes mellitus with unspecified complications: Secondary | ICD-10-CM

## 2018-09-05 DIAGNOSIS — Z7984 Long term (current) use of oral hypoglycemic drugs: Secondary | ICD-10-CM | POA: Diagnosis not present

## 2018-09-05 DIAGNOSIS — R110 Nausea: Secondary | ICD-10-CM | POA: Diagnosis not present

## 2018-09-05 LAB — CMP (CANCER CENTER ONLY)
ALBUMIN: 3.2 g/dL — AB (ref 3.5–5.0)
ALT: 23 U/L (ref 0–44)
ANION GAP: 11 (ref 5–15)
AST: 19 U/L (ref 15–41)
Alkaline Phosphatase: 92 U/L (ref 38–126)
BUN: 13 mg/dL (ref 8–23)
CHLORIDE: 100 mmol/L (ref 98–111)
CO2: 27 mmol/L (ref 22–32)
Calcium: 9.1 mg/dL (ref 8.9–10.3)
Creatinine: 0.81 mg/dL (ref 0.44–1.00)
GFR, Est AFR Am: >60 mL/min (ref >60)
GLUCOSE: 257 mg/dL — AB (ref 70–99)
POTASSIUM: 4 mmol/L (ref 3.5–5.1)
Sodium: 138 mmol/L (ref 135–145)
Total Bilirubin: 0.5 mg/dL (ref 0.3–1.2)
Total Protein: 7.9 g/dL (ref 6.5–8.1)

## 2018-09-05 LAB — CBC WITH DIFFERENTIAL (CANCER CENTER ONLY)
ABS IMMATURE GRANULOCYTES: 0.04 10*3/uL (ref 0.00–0.07)
BASOS ABS: 0 10*3/uL (ref 0.0–0.1)
Basophils Relative: 0 %
Eosinophils Absolute: 0.3 10*3/uL (ref 0.0–0.5)
Eosinophils Relative: 3 %
HCT: 41.1 % (ref 36.0–46.0)
HEMOGLOBIN: 13.6 g/dL (ref 12.0–15.0)
IMMATURE GRANULOCYTES: 1 %
LYMPHS ABS: 3 10*3/uL (ref 0.7–4.0)
LYMPHS PCT: 35 %
MCH: 28.8 pg (ref 26.0–34.0)
MCHC: 33.1 g/dL (ref 30.0–36.0)
MCV: 87.1 fL (ref 80.0–100.0)
MONOS PCT: 5 %
Monocytes Absolute: 0.5 10*3/uL (ref 0.1–1.0)
NEUTROS PCT: 56 %
NRBC: 0 % (ref 0.0–0.2)
Neutro Abs: 4.9 10*3/uL (ref 1.7–7.7)
PLATELETS: 274 10*3/uL (ref 150–400)
RBC: 4.72 MIL/uL (ref 3.87–5.11)
RDW: 12.4 % (ref 11.5–15.5)
WBC Count: 8.7 10*3/uL (ref 4.0–10.5)

## 2018-09-06 LAB — KAPPA/LAMBDA LIGHT CHAINS
KAPPA FREE LGHT CHN: 40.1 mg/L — AB (ref 3.3–19.4)
KAPPA, LAMDA LIGHT CHAIN RATIO: 2.02 — AB (ref 0.26–1.65)
LAMDA FREE LIGHT CHAINS: 19.9 mg/L (ref 5.7–26.3)

## 2018-09-09 ENCOUNTER — Telehealth: Payer: Self-pay | Admitting: Hematology and Oncology

## 2018-09-09 LAB — MULTIPLE MYELOMA PANEL, SERUM
ALBUMIN/GLOB SERPL: 0.9 (ref 0.7–1.7)
Albumin SerPl Elph-Mcnc: 3.4 g/dL (ref 2.9–4.4)
Alpha 1: 0.2 g/dL (ref 0.0–0.4)
Alpha2 Glob SerPl Elph-Mcnc: 0.9 g/dL (ref 0.4–1.0)
B-Globulin SerPl Elph-Mcnc: 1.2 g/dL (ref 0.7–1.3)
GAMMA GLOB SERPL ELPH-MCNC: 1.8 g/dL (ref 0.4–1.8)
GLOBULIN, TOTAL: 4 g/dL — AB (ref 2.2–3.9)
IGA: 442 mg/dL — AB (ref 87–352)
IgG (Immunoglobin G), Serum: 1416 mg/dL (ref 700–1600)
IgM (Immunoglobulin M), Srm: 767 mg/dL — ABNORMAL HIGH (ref 26–217)
M Protein SerPl Elph-Mcnc: 0.5 g/dL — ABNORMAL HIGH
Total Protein ELP: 7.4 g/dL (ref 6.0–8.5)

## 2018-09-09 NOTE — Telephone Encounter (Signed)
Called patient per 1/13 sch message - pt is aware of appt date and time

## 2018-09-10 ENCOUNTER — Encounter (HOSPITAL_COMMUNITY): Payer: Self-pay | Admitting: Cardiology

## 2018-09-10 ENCOUNTER — Ambulatory Visit (HOSPITAL_COMMUNITY)
Admission: RE | Admit: 2018-09-10 | Discharge: 2018-09-10 | Disposition: A | Discharge status: Home / Self Care | Payer: 59 | Source: Ambulatory Visit | Attending: Cardiology | Admitting: Cardiology

## 2018-09-10 VITALS — BP 128/80 | HR 97 | Wt 297.8 lb

## 2018-09-10 DIAGNOSIS — Z87891 Personal history of nicotine dependence: Secondary | ICD-10-CM | POA: Insufficient documentation

## 2018-09-10 DIAGNOSIS — E039 Hypothyroidism, unspecified: Secondary | ICD-10-CM | POA: Diagnosis not present

## 2018-09-10 DIAGNOSIS — I252 Old myocardial infarction: Secondary | ICD-10-CM | POA: Insufficient documentation

## 2018-09-10 DIAGNOSIS — Z79890 Hormone replacement therapy (postmenopausal): Secondary | ICD-10-CM | POA: Diagnosis not present

## 2018-09-10 DIAGNOSIS — Z79899 Other long term (current) drug therapy: Secondary | ICD-10-CM | POA: Diagnosis not present

## 2018-09-10 DIAGNOSIS — Z794 Long term (current) use of insulin: Secondary | ICD-10-CM | POA: Insufficient documentation

## 2018-09-10 DIAGNOSIS — G4733 Obstructive sleep apnea (adult) (pediatric): Secondary | ICD-10-CM | POA: Diagnosis not present

## 2018-09-10 DIAGNOSIS — Z8249 Family history of ischemic heart disease and other diseases of the circulatory system: Secondary | ICD-10-CM | POA: Insufficient documentation

## 2018-09-10 DIAGNOSIS — Z7982 Long term (current) use of aspirin: Secondary | ICD-10-CM | POA: Diagnosis not present

## 2018-09-10 DIAGNOSIS — E119 Type 2 diabetes mellitus without complications: Secondary | ICD-10-CM | POA: Diagnosis not present

## 2018-09-10 DIAGNOSIS — I2510 Atherosclerotic heart disease of native coronary artery without angina pectoris: Secondary | ICD-10-CM | POA: Insufficient documentation

## 2018-09-10 DIAGNOSIS — I5022 Chronic systolic (congestive) heart failure: Secondary | ICD-10-CM | POA: Diagnosis not present

## 2018-09-10 DIAGNOSIS — E785 Hyperlipidemia, unspecified: Secondary | ICD-10-CM | POA: Diagnosis not present

## 2018-09-10 MED ORDER — SACUBITRIL-VALSARTAN 49-51 MG PO TABS: 1.0000 | ORAL_TABLET | Freq: Two times a day (BID) | ORAL | 6 refills | Status: DC

## 2018-09-10 MED FILL — ENTRESTO 49 MG-51 MG TABLET: 49-51 | 30 days supply | Qty: 60 | Fill #0

## 2018-09-10 NOTE — Progress Notes (Signed)
PCP: [name], MD  Cardiology: Dr. Irish Lack HF Cardiology: Dr. Aundra Dubin  63 yo with history of chronic systolic CHF, CAD, and MGUS was referred by Dr. Irish Lack for evaluation of CHF.  Patient first developed exertional dyspnea after her appendectomy in 2/19.  By 5/19, she was gaining weight and had developed a chronic cough. She never had chest pain.  Echo was done in 5/19 showing EF 25-30%, moderate MR, moderate RV dilation.  RHC/LHC was done in 6/19 showing elevated filling pressures, preserved cardiac output, and occluded mid LCx and distal RCA.  She was found to have IgM monoclonal antibody.  Skeletal survey showed no lytic lesions, likely MGUS.  Cardiac MRI in 8/19 showed EF 32%, subendocardial scar (consistent with prior MI) at inferior base, moderately dilated RV with RV EF 20%. No evidence for cardiac amyloidosis by MRI.  Repeat echo in 10/19 showed EF remaining 30-35%.  She was seen by Dr. Rayann Heman and declined ICD.   She returns for followup of CHF.  Generally doing well.  Able to walk on flat ground, shop at [employer] without dyspnea.  She is short of breath with stairs. No chest pain.  No lightheadedness.  Plans to start swimming for exercise.   Labs (5/19): transferrin saturation 20%, immunofixation with IgM monoclonal antibody.  Labs (6/19): K 4.5, creatinine 0.79 Labs (7/19): K 4.1, creatinine 0.86, TSH normal Labs (9/19): K 4.4, creatinine 0.61 Labs (10/19): LDL 187 Labs (12/19): LDL 82 Labs (1/20): K 4, creatinine 0.81  PMH: 1. Type II diabetes 2. OSA 3. H/o appendectomy 4. 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  Skeletal survey in 7/19 showed no lytic lesions.  6. 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   - Echo (5/19): EF 25-30%, mild LV dilation, moderate MR, moderately dilated RV.  - LHC/RHC (6/19): distal RCA totally occluded, mid LCx totally occluded, EF < 25%. No intervention. Mean RA 10, mean PA 42, mean  PCWP 29, CI 2.3.  - Cardiac MRI in 8/19 showed EF 32%, subendocardial scar (consistent with prior MI) at inferior base, moderately dilated RV with RV EF 20%. No evidence for cardiac amyloidosis by MRI.  - Echo (10/19): EF 30-35%.  7. CAD: LHC (6/19) with distal RCA totally occluded, mid LCx totally occluded, EF < 25%. No intervention. 8. Hyperlipidemia: Has not tolerated statins.  9. ABIs (8/19): Normal.   SH: Married, works as Nurse, adult, quit smoking in 1991.    FH: Mother with rheumatic MV disease, s/p MVR.   ROS: All systems reviewed and negative except as per HPI.   Current Outpatient Medications  Medication Sig Dispense Refill  . albuterol (PROAIR HFA) 108 (90 Base) MCG/ACT inhaler Inhale 2 puffs into the lungs every 6 (six) hours as needed. wheezing 18 g 2  . ASPIRIN ADULT LOW STRENGTH 81 MG EC tablet TAKE 1 TABLET (81 MG TOTAL) BY MOUTH DAILY. 30 tablet 3  . bismuth subsalicylate (PEPTO BISMOL) 262 MG chewable tablet Chew 524 mg by mouth as needed for indigestion or diarrhea or loose stools.     . carvedilol (COREG) 12.5 MG tablet Take 1 tablet (12.5 mg total) by mouth 2 (two) times daily. 60 tablet 5  . cetirizine (ZYRTEC) 10 MG tablet Take 10 mg by mouth daily.    . Evolocumab (REPATHA SURECLICK) 782 MG/ML SOAJ Inject 1 pen into the skin every 14 (fourteen) days. 2 pen 11  . fluticasone (FLONASE) 50 MCG/ACT nasal spray Place 2 sprays into  both nostrils daily as needed for allergies or rhinitis.    . furosemide (LASIX) 40 MG tablet Take 1 tablet (40 mg total) by mouth 2 (two) times daily. 180 tablet 3  . Insulin Degludec (TRESIBA FLEXTOUCH) 200 UNIT/ML SOPN Inject 18 Units into the skin daily. 3 pen 5  . Insulin Pen Needle 32G X 4 MM MISC Use 2x a day 200 each 3  . levothyroxine (SYNTHROID) 50 MCG tablet Take 1 tablet (50 mcg total) by mouth daily before breakfast. 90 tablet 3  . metFORMIN (GLUCOPHAGE) 1000 MG tablet Take 1 tablet (1,000 mg total) by mouth 2 (two)  times daily with a meal. 180 tablet 3  . Multiple Vitamin (MULITIVITAMIN WITH MINERALS) TABS Take 1 tablet by mouth daily with breakfast.    . spironolactone (ALDACTONE) 25 MG tablet Take 1 tablet (25 mg total) by mouth daily. 90 tablet 3  . TRULICITY 1.5 WH/6.7RF SOPN INJECT 1 SYRINGE INTO THE SKIN ONCE A WEEK AS DIRECTED 2 mL 0  . magnesium oxide (MAG-OX) 400 MG tablet Take 400 mg by mouth at bedtime as needed (for cramps).    . sacubitril-valsartan (ENTRESTO) 49-51 MG Take 1 tablet by mouth 2 (two) times daily. 60 tablet 6   No current facility-administered medications for this encounter.    BP 128/80   Pulse 97   Wt 135.1 kg (297 lb 12.8 oz)   SpO2 98%   BMI 47.30 kg/m  General: NAD, obese.  Neck: No JVD, no thyromegaly or thyroid nodule.  Lungs: Clear to auscultation bilaterally with normal respiratory effort. CV: Nondisplaced PMI.  Heart regular S1/S2, no S3/S4, no murmur.  No peripheral edema.  No carotid bruit.  Normal pedal pulses.  Abdomen: Soft, nontender, no hepatosplenomegaly, no distention.  Skin: Intact without lesions or rashes.  Neurologic: Alert and oriented x 3.  Psych: Normal affect. Extremities: No clubbing or cyanosis.  HEENT: Normal.   Assessment/Plan: 1. Chronic systolic CHF: Echo in 1/63 with EF 25-30%.  She had occluded mid LCx and distal RCA on coronary angiography.  Suspect ischemic cardiomyopathy.  She also has a monoclonal antibody.  Cardiac MRI in 8/19 showed LV EF 32% and RV EF 30%, subendocardial scar at inferior base consistent with prior MI, no evidence for cardiac amyloidosis.  Repeat echo in 10/19 showed EF 30-35%.  Patient saw Dr. Rayann Heman and declined ICD.  NYHA class II symptoms, she is not volume overloaded.  - Increase Entresto to 49/51 bid. BMET in 10 days.  - Continue spironolactone 25 mg daily.   - Continue Coreg 12.5 mg bid.   - She has not wanted to take an SGLT2 inhibitor due to history of GU infections.    - Continue Lasix 40 mg bid. BMET  today.  2. CAD: Occluded mLCx and dRCA.  Low EF somewhat out of proportion to this.  No interventional target.   - Continue ASA 81 daily.  - Unable to tolerate statins, now on Repatha with good lipids in 12/19.    Followup in 3 months.    Loralie Champagne 09/10/2018

## 2018-09-10 NOTE — Patient Instructions (Addendum)
INCREASE Entresto to 49/51mg  twice a day  Labs in 10 days We will only contact you if something comes back abnormal or we need to make some changes. Otherwise no news is good news!  Your physician recommends that you schedule a follow-up appointment in: 3 months. Please call us next month to schedule.

## 2018-09-12 ENCOUNTER — Inpatient Hospital Stay: Payer: 59 | Admitting: Hematology and Oncology

## 2018-09-12 ENCOUNTER — Ambulatory Visit: Payer: 59 | Admitting: Pulmonary Disease

## 2018-09-16 ENCOUNTER — Telehealth: Payer: Self-pay | Admitting: Hematology and Oncology

## 2018-09-16 ENCOUNTER — Inpatient Hospital Stay: Payer: 59 | Admitting: Hematology and Oncology

## 2018-09-16 DIAGNOSIS — Z7982 Long term (current) use of aspirin: Secondary | ICD-10-CM | POA: Diagnosis not present

## 2018-09-16 DIAGNOSIS — D472 Monoclonal gammopathy: Secondary | ICD-10-CM

## 2018-09-16 DIAGNOSIS — Z119 Encounter for screening for infectious and parasitic diseases, unspecified: Secondary | ICD-10-CM | POA: Diagnosis not present

## 2018-09-16 DIAGNOSIS — E119 Type 2 diabetes mellitus without complications: Secondary | ICD-10-CM | POA: Diagnosis not present

## 2018-09-16 DIAGNOSIS — I110 Hypertensive heart disease with heart failure: Secondary | ICD-10-CM | POA: Diagnosis not present

## 2018-09-16 DIAGNOSIS — Z79899 Other long term (current) drug therapy: Secondary | ICD-10-CM

## 2018-09-16 DIAGNOSIS — Z7984 Long term (current) use of oral hypoglycemic drugs: Secondary | ICD-10-CM

## 2018-09-16 DIAGNOSIS — R110 Nausea: Secondary | ICD-10-CM | POA: Diagnosis not present

## 2018-09-16 NOTE — Assessment & Plan Note (Signed)
Lab review 09/05/2018: M protein 0.5 g IgM kappa light chain restricted, IgG 1416, kappa 40.1, KL ratio 2, Hemoglobin 13.6, creatinine 0.81 01/24/2018 M protein 0.5 g IgM kappa light chain restricted, IgG 1723, IgA 540, IgM 706, Hemoglobin 15 g Bone survey 03/14/2018: Negative  I reviewed the labs with the patient and provided patient with a copy of this report. There is no evidence of progression of disease. Since there is no evidence of progression there is no need to do a bone marrow biopsy.  Return to clinic in 6 months with labs done ahead of time.  After this time we can see the patient annually thereafter.

## 2018-09-16 NOTE — Progress Notes (Signed)
Patient Care Team: [name], MD as PCP - General (Internal Medicine) Rigoberto Noel, MD as Consulting Physician (Pulmonary Disease) Alphonsa Overall, MD (General Surgery)  DIAGNOSIS:  Encounter Diagnosis  Name Primary?  Marland Kitchen MGUS (monoclonal gammopathy of unknown significance)     CHIEF COMPLIANT: F/U of MGUS IgM kappa  INTERVAL HISTORY: Terri Heath is a 63 year old with above-mentioned history of MGUS IgM kappa.  She is here for 71-monthfollow-up.  She reports that recently the Entresto dose was increased and she feels slightly more nauseous.  She is otherwise doing quite well she is taking care of 2 grandchildren ages 474and 742and she is extremely happy about her life.  Denies any significant neuropathy.  REVIEW OF SYSTEMS:   Constitutional: Denies fevers, chills or abnormal weight loss Eyes: Denies blurriness of vision Ears, nose, mouth, throat, and face: Denies mucositis or sore throat Respiratory: Denies cough, dyspnea or wheezes Cardiovascular: Denies palpitation, chest discomfort Gastrointestinal:  Denies nausea, heartburn or change in bowel habits Skin: Denies abnormal skin rashes Lymphatics: Denies new lymphadenopathy or easy bruising Neurological:Denies numbness, tingling or new weaknesses Behavioral/Psych: Mood is stable, no new changes  Extremities: No lower extremity edema  All other systems were reviewed with the patient and are negative.  I have reviewed the past medical history, past surgical history, social history and family history with the patient and they are unchanged from previous note.  ALLERGIES:  is allergic to amoxicillin; adhesive [tape]; exenatide; invokana [canagliflozin]; lisinopril; other; and statins.  MEDICATIONS:  Current Outpatient Medications  Medication Sig Dispense Refill  . albuterol (PROAIR HFA) 108 (90 Base) MCG/ACT inhaler Inhale 2 puffs into the lungs every 6 (six) hours as needed. wheezing 18 g 2  . ASPIRIN ADULT LOW  STRENGTH 81 MG EC tablet TAKE 1 TABLET (81 MG TOTAL) BY MOUTH DAILY. 30 tablet 3  . bismuth subsalicylate (PEPTO BISMOL) 262 MG chewable tablet Chew 524 mg by mouth as needed for indigestion or diarrhea or loose stools.     . carvedilol (COREG) 12.5 MG tablet Take 1 tablet (12.5 mg total) by mouth 2 (two) times daily. 60 tablet 5  . cetirizine (ZYRTEC) 10 MG tablet Take 10 mg by mouth daily.    . Evolocumab (REPATHA SURECLICK) 1212MG/ML SOAJ Inject 1 pen into the skin every 14 (fourteen) days. 2 pen 11  . fluticasone (FLONASE) 50 MCG/ACT nasal spray Place 2 sprays into both nostrils daily as needed for allergies or rhinitis.    . furosemide (LASIX) 40 MG tablet Take 1 tablet (40 mg total) by mouth 2 (two) times daily. 180 tablet 3  . Insulin Degludec (TRESIBA FLEXTOUCH) 200 UNIT/ML SOPN Inject 18 Units into the skin daily. 3 pen 5  . Insulin Pen Needle 32G X 4 MM MISC Use 2x a day 200 each 3  . levothyroxine (SYNTHROID) 50 MCG tablet Take 1 tablet (50 mcg total) by mouth daily before breakfast. 90 tablet 3  . magnesium oxide (MAG-OX) 400 MG tablet Take 400 mg by mouth at bedtime as needed (for cramps).    . metFORMIN (GLUCOPHAGE) 1000 MG tablet Take 1 tablet (1,000 mg total) by mouth 2 (two) times daily with a meal. 180 tablet 3  . Multiple Vitamin (MULITIVITAMIN WITH MINERALS) TABS Take 1 tablet by mouth daily with breakfast.    . sacubitril-valsartan (ENTRESTO) 49-51 MG Take 1 tablet by mouth 2 (two) times daily. 60 tablet 6  . spironolactone (ALDACTONE) 25 MG tablet Take 1 tablet (  25 mg total) by mouth daily. 90 tablet 3  . TRULICITY 1.5 RC/7.8LF SOPN INJECT 1 SYRINGE INTO THE SKIN ONCE A WEEK AS DIRECTED 2 mL 0   No current facility-administered medications for this visit.     PHYSICAL EXAMINATION: ECOG PERFORMANCE STATUS: 1 - Symptomatic but completely ambulatory  Vitals:   09/16/18 1336  BP: 91/80  Pulse: 96  Resp: 18  Temp: 98.2 F (36.8 C)  SpO2: 97%   Filed Weights    09/16/18 1336  Weight: 294 lb 12.8 oz (133.7 kg)    GENERAL:alert, no distress and comfortable SKIN: skin color, texture, turgor are normal, no rashes or significant lesions EYES: normal, Conjunctiva are pink and non-injected, sclera clear OROPHARYNX:no exudate, no erythema and lips, buccal mucosa, and tongue normal  NECK: supple, thyroid normal size, non-tender, without nodularity LYMPH:  no palpable lymphadenopathy in the cervical, axillary or inguinal LUNGS: clear to auscultation and percussion with normal breathing effort HEART: regular rate & rhythm and no murmurs and no lower extremity edema ABDOMEN:abdomen soft, non-tender and normal bowel sounds MUSCULOSKELETAL:no cyanosis of digits and no clubbing  NEURO: alert & oriented x 3 with fluent speech, no focal motor/sensory deficits EXTREMITIES: No lower extremity edema   LABORATORY DATA:  I have reviewed the data as listed CMP Latest Ref Rng & Units 09/05/2018 07/31/2018 06/10/2018  Glucose 70 - 99 mg/dL 257(H) - 223(H)  BUN 8 - 23 mg/dL 13 - 16  Creatinine 0.44 - 1.00 mg/dL 0.81 - 0.83  Sodium 135 - 145 mmol/L 138 - 138  Potassium 3.5 - 5.1 mmol/L 4.0 - 4.4  Chloride 98 - 111 mmol/L 100 - 99  CO2 22 - 32 mmol/L 27 - 28  Calcium 8.9 - 10.3 mg/dL 9.1 - 9.1  Total Protein 6.5 - 8.1 g/dL 7.9 7.0 -  Total Bilirubin 0.3 - 1.2 mg/dL 0.5 0.2 -  Alkaline Phos 38 - 126 U/L 92 92 -  AST 15 - 41 U/L 19 14 -  ALT 0 - 44 U/L 23 19 -    Lab Results  Component Value Date   WBC 8.7 09/05/2018   HGB 13.6 09/05/2018   HCT 41.1 09/05/2018   MCV 87.1 09/05/2018   PLT 274 09/05/2018   NEUTROABS 4.9 09/05/2018    ASSESSMENT & PLAN:  MGUS (monoclonal gammopathy of unknown significance) Lab review 09/05/2018: M protein 0.5 g IgM kappa light chain restricted, IgG 1416, kappa 40.1, KL ratio 2, Hemoglobin 13.6, creatinine 0.81 01/24/2018 M protein 0.5 g IgM kappa light chain restricted, IgG 1723, IgA 540, IgM 706, Hemoglobin 15 g Bone survey  03/14/2018: Negative  I reviewed the labs with the patient and provided patient with a copy of this report. There is no evidence of progression of disease. Since there is no evidence of progression there is no need to do a bone marrow biopsy.  Return to clinic in 1 year with labs done ahead of time and follow-up.      Orders Placed This Encounter  Procedures  . CBC with Differential ([facility] Only)    Standing Status:   Future    Standing Expiration Date:   09/17/2019  . CMP ([facility] only)    Standing Status:   Future    Standing Expiration Date:   09/17/2019  . Multiple Myeloma Panel (SPEP&IFE w/QIG)    Standing Status:   Future    Standing Expiration Date:   10/21/2019  . Kappa/lambda light chains    Standing Status:  Future    Standing Expiration Date:   10/21/2019  . Beta 2 microglobulin, serum    Standing Status:   Future    Standing Expiration Date:   10/21/2019   The patient has a good understanding of the overall plan. she agrees with it. she will call with any problems that may develop before the next visit here.   Harriette Ohara, MD 09/16/18

## 2018-09-16 NOTE — Telephone Encounter (Signed)
Gave avs and calendar ° °

## 2018-09-17 ENCOUNTER — Telehealth (HOSPITAL_COMMUNITY): Payer: Self-pay

## 2018-09-17 ENCOUNTER — Other Ambulatory Visit: Payer: Self-pay | Admitting: Internal Medicine

## 2018-09-17 ENCOUNTER — Ambulatory Visit: Payer: 59 | Admitting: Internal Medicine

## 2018-09-17 MED FILL — CARVEDILOL 12.5 MG TABLET: 12.5 | 30 days supply | Qty: 60 | Fill #2

## 2018-09-17 MED FILL — REPATHA SURECLICK 140 MG/ML: 140 | 28 days supply | Qty: 2 | Fill #1

## 2018-09-17 MED FILL — TRULICITY 1.5 MG/0.5 ML PEN: 1.5 | 28 days supply | Qty: 2 | Fill #0

## 2018-09-17 NOTE — Telephone Encounter (Signed)
It is more likely she has a stomach bug as she tolerated Entresto well previously. She can go back to lower dose of Entresto if she prefers for now, but we will likely attempt to rechallenge her in the future, as she will get more benefit from a higher dose of the drug.     Legrand Como [address] [place], PA-C 09/17/2018 9:33 AM

## 2018-09-17 NOTE — Telephone Encounter (Signed)
Can she come for BP check?  If BP dropping too low she could be dehydrate and may have to adjust lasix or may not tolerate simply due to low BP.    Legrand Como [address] [place], PA-C 09/17/2018 9:49 AM

## 2018-09-17 NOTE — Telephone Encounter (Signed)
Pt notified. Pt stated she will try and stick with Entresto 49/51.

## 2018-09-17 NOTE — Telephone Encounter (Signed)
Pt called to report that since her Terri Heath was increased to 49/51, she is experiencing nausea and fatigue to the point she has had to call out of work. Pt states that she did very well on the lower dose of Entresto. Please advise.

## 2018-09-17 NOTE — Telephone Encounter (Signed)
Left VM for pt to call clinic. 

## 2018-09-18 MED FILL — FUROSEMIDE 40 MG TAB: 40 | 90 days supply | Qty: 180 | Fill #2

## 2018-09-20 ENCOUNTER — Ambulatory Visit (HOSPITAL_COMMUNITY)
Admission: RE | Admit: 2018-09-20 | Discharge: 2018-09-20 | Disposition: A | Discharge status: Home / Self Care | Payer: 59 | Source: Ambulatory Visit | Attending: Cardiology | Admitting: Cardiology

## 2018-09-20 DIAGNOSIS — I5022 Chronic systolic (congestive) heart failure: Secondary | ICD-10-CM | POA: Diagnosis not present

## 2018-09-20 LAB — BASIC METABOLIC PANEL
Anion gap: 12 (ref 5–15)
BUN: 13 mg/dL (ref 8–23)
CHLORIDE: 96 mmol/L — AB (ref 98–111)
CO2: 27 mmol/L (ref 22–32)
Calcium: 9.5 mg/dL (ref 8.9–10.3)
Creatinine, Ser: 0.8 mg/dL (ref 0.44–1.00)
Glucose, Bld: 298 mg/dL — ABNORMAL HIGH (ref 70–99)
POTASSIUM: 4.5 mmol/L (ref 3.5–5.1)
SODIUM: 135 mmol/L (ref 135–145)

## 2018-10-14 MED FILL — ENTRESTO 49 MG-51 MG TABLET: 49-51 | 30 days supply | Qty: 60 | Fill #1

## 2018-10-14 MED FILL — SPIRONOLACTONE 25 MG TABLET: 25 | 90 days supply | Qty: 90 | Fill #1

## 2018-10-14 MED FILL — metFORMIN HCL 1000 MG TABS: 1000 | 90 days supply | Qty: 180 | Fill #2

## 2018-10-14 MED FILL — CARVEDILOL 12.5 MG TABLET: 12.5 | 30 days supply | Qty: 60 | Fill #3

## 2018-10-14 MED FILL — LEVOTHYROXINE 50 MCG TABLET: 50 | 90 days supply | Qty: 90 | Fill #3

## 2018-10-16 ENCOUNTER — Other Ambulatory Visit: Payer: Self-pay | Admitting: Internal Medicine

## 2018-10-16 MED FILL — TRULICITY 1.5 MG/0.5 ML PEN: 1.5 | 28 days supply | Qty: 2 | Fill #0

## 2018-10-16 MED FILL — ASPIRIN ADULT LOW STRENGTH: 81 | 30 days supply | Qty: 30 | Fill #2

## 2018-10-16 MED FILL — REPATHA SURECLICK 140 MG/ML: 140 | 28 days supply | Qty: 2 | Fill #2 | Status: TO

## 2018-10-29 ENCOUNTER — Telehealth: Payer: 59 | Admitting: Physician Assistant

## 2018-10-29 DIAGNOSIS — R109 Unspecified abdominal pain: Secondary | ICD-10-CM

## 2018-10-29 NOTE — Progress Notes (Signed)
Based on what you shared with me, I feel your condition warrants further evaluation and I recommend that you be seen for a face to face office visit.   NOTE: If you entered your credit card information for this eVisit, you will not be charged. You may see a "hold" on your card for the $30 but that hold will drop off and you will not have a charge processed.  If you are having a true medical emergency please call 911.  If you need an urgent face to face visit, [facility] has four urgent care centers for your convenience.  If you need care fast and have a high deductible or no insurance consider: ?  [email or web address] to reserve your spot online an avoid wait times  [facility] [address] [place], [place] [address] 8 am to 8 pm Monday-Friday 10 am to 4 pm Saturday-Sunday *Across the street from [employer]  [place], [address] 8 am to 5 pm Monday-Friday * In the [facility] on the [facility]   The following sites will take your insurance:  [facility] a Provider at this Location  [place], [place] [address] 10 am to 8 pm Monday-Friday 12 pm to 8 pm [facility] a Provider at this Location  [place], [address] [place], [place] [address] 8 am to 8 pm Monday-Friday 9 am to 6 pm Saturday 11 am to 6 pm Sunday   [facility]  [phone] Get Driving Directions  [address] [place], [place] [address] 8 am to 8 pm Monday-Friday 8 am to 4 pm Saturday-Sunday   Your e-visit answers were reviewed by a board certified advanced clinical practitioner to complete your personal care plan.  Thank you for using e-Visits.  ===View-only below this line===   ----- Message  -----    From: Terri Heath    Sent: 10/29/2018 11:23 AM EST      To: E-Visit Mailing List Subject: E-Visit Submission: Flu Like Symptoms  E-Visit Submission: Flu Like Symptoms --------------------------------  Question: How long have you had flu like symptoms? Answer:   less than 48 hours  Question: Are you having any body aches or pain such as Answer:   Muscle pain  Question: Do you have a cough or sore throat? Answer:   None of the above  Question: Are you in close contact with anyone who has similar symptoms ? Answer:   Yes  Question: Do you have a fever? Answer:   Yes, I have a low fever (lower than 101 degrees)  Question: Are you short of breath? Answer:   No  Question: Do you have constant pain in your chest or abdomen? Answer:   No  Question: Are you able to keep liquids down? Answer:   Yes  Question: Have you experienced a seizure or loss of consciousness? Answer:   No  Question: Do you have a headache? Answer:   Yes  Question: Is there a rash? Answer:   No  Question: Are you over the age of 63? Answer:   No  Question: Are you treated for any of the following conditions: Asthma, COPD, diabetes, renal failure on dialysis, AIDS, any neuromuscular disease that effects the clearing of secretions heart failure or heart disease? Answer:  Yes  Question: Have you received recent chemotherapy or are you taking a drug that may reduce your ability to fight infections for psoriasis, arthritis, or any other autoimmune condition? Answer:   No  Question: Do you have close contact with anyone who has been diagnosed with any of the conditions listed above? Answer:   Yes  Question: Are your symptoms severe enough that you think or someone has told you that you need to see a physician urgently? Answer:   No  Question: Are you allergic to Zanamivir or Oseltamivir (Tamiflu)? Answer:   No  Question: Are there children in your family or household that are under the  age of 74? Answer:   No  Question: Please list your medication allergies that you may have ? (If 'none' , please list as 'none') Answer:   See list.  Question: Please list any additional comments  Answer:   My stomach hurts . Iam nauseous . My belly hurts. I'm tired. I feel bad.

## 2018-11-15 ENCOUNTER — Other Ambulatory Visit: Payer: Self-pay | Admitting: Internal Medicine

## 2018-11-15 MED FILL — CARVEDILOL 12.5 MG TABLET: 12.5 | 30 days supply | Qty: 60 | Fill #4 | Status: TO

## 2018-11-15 MED FILL — ENTRESTO 49 MG-51 MG TABLET: 49-51 | 30 days supply | Qty: 60 | Fill #2 | Status: TO

## 2018-11-15 MED FILL — ASPIRIN ADULT LOW STRENGTH: 81 | 30 days supply | Qty: 30 | Fill #3

## 2018-11-15 MED FILL — TRULICITY 1.5 MG/0.5 ML PEN: 1.5 | 28 days supply | Qty: 2 | Fill #0

## 2018-11-18 MED FILL — REPATHA SURECLICK 140 MG/ML: 140 | 28 days supply | Qty: 2 | Fill #0

## 2018-11-25 DIAGNOSIS — G4733 Obstructive sleep apnea (adult) (pediatric): Secondary | ICD-10-CM | POA: Diagnosis not present

## 2018-12-13 ENCOUNTER — Other Ambulatory Visit: Payer: Self-pay | Admitting: Internal Medicine

## 2018-12-13 MED FILL — REPATHA SURECLICK 140 MG/ML: 140 | 28 days supply | Qty: 2 | Fill #1

## 2018-12-13 MED FILL — FUROSEMIDE 40 MG TAB: 40 | 90 days supply | Qty: 180 | Fill #0

## 2018-12-13 MED FILL — CARVEDILOL 12.5 MG TABLET: 12.5 | 30 days supply | Qty: 60 | Fill #0

## 2018-12-13 MED FILL — TRULICITY 1.5 MG/0.5 ML PEN: 1.5 | 28 days supply | Qty: 2 | Fill #0

## 2018-12-17 ENCOUNTER — Ambulatory Visit: Payer: 59 | Admitting: Internal Medicine

## 2018-12-19 ENCOUNTER — Encounter: Payer: Self-pay | Admitting: Internal Medicine

## 2018-12-19 ENCOUNTER — Encounter

## 2018-12-19 ENCOUNTER — Ambulatory Visit (INDEPENDENT_AMBULATORY_CARE_PROVIDER_SITE_OTHER): Payer: 59 | Admitting: Internal Medicine

## 2018-12-19 DIAGNOSIS — E1165 Type 2 diabetes mellitus with hyperglycemia: Secondary | ICD-10-CM

## 2018-12-19 DIAGNOSIS — E7849 Other hyperlipidemia: Secondary | ICD-10-CM | POA: Diagnosis not present

## 2018-12-19 DIAGNOSIS — E1159 Type 2 diabetes mellitus with other circulatory complications: Secondary | ICD-10-CM | POA: Diagnosis not present

## 2018-12-19 MED ORDER — EMPAGLIFLOZIN 10 MG PO TABS: 10.0000 mg | ORAL_TABLET | Freq: Every day | ORAL | 5 refills | Status: DC

## 2018-12-19 NOTE — Patient Instructions (Signed)
Please continue: - Metformin 1000 mg 2x a day with meals - Tresiba U200 18 units daily - Trulicity 1.5 mg weekly  Please return in 3 months with your sugar log.

## 2018-12-19 NOTE — Progress Notes (Signed)
Patient ID: Arlie Solomons, female   DOB: 09-03-1955, 63 y.o.   MRN: [id number]   Patient location: Home My location: Office  Referring Provider: [name], MD  I connected with the patient on 12/19/18 at  11:38 am EDT by a video enabled telemedicine application and verified that I am speaking with the correct person.   I discussed the limitations of evaluation and management by telemedicine and the availability of in person appointments. The patient expressed understanding and agreed to proceed.   Details of the encounter are shown below.  HPI: ARFA LAMARCA is a 63 y.o.-year-old female, returning for f/u for DM2, dx in 2003, non-insulin-dependent, uncontrolled, with complications (PVD, CAD, sCHF). Last visit 5 mo ago.  Last hemoglobin A1c was: Lab Results  Component Value Date   HGBA1C 10.3 (A) 07/24/2018   HGBA1C 11.9 (A) 04/18/2018   HGBA1C 14.0 (A) 01/16/2018   HGBA1C 11.9 01/18/2017   HGBA1C 11.4 09/27/2016   HGBA1C 9.8 (H) 07/05/2016   HGBA1C 9.9 (H) 03/14/2016   HGBA1C 10.0 (H) 10/27/2015   HGBA1C 9.6 (H) 04/21/2015   HGBA1C 9.5 (H) 04/20/2015   Pt was on: - Metformin 1000 mg 2x a day  At last visit, we changed to: - Metformin 1000 mg 2x a day with meals - Trulicity 1.5 mg weekly We started [place] U200 18 units daily 06/2018 >> stopped because of weight gain!  She has been on: - Actos - JanuMet - Invokana >> yeast vaginitis - Byetta >> flu-like sxs - Humalog >> wt gain  - stopped insulin 2015 >> stopped >> lost 90 lbs! - Glipizide >> leg edema  Pt checks her sugars 1x a day: - am: 230s >> 180-225 - 2h after b'fast: n/c - before lunch: n/c - 2h after lunch: n/c - before dinner: n/c - 2h after dinner: n/c - bedtime: n/c - nighttime: n/c Lowest sugar was 198 >> 180; she has hypoglycemia awareness at  160.  Highest sugar was 311 >> 225.  Glucometer: ?  Pt's meals are: - Breakfast: 2 eggs + bacon or coffee - Lunch: meat + green veggies -  Dinner: meat + veggies  - no CKD, last BUN/creatinine:  Lab Results  Component Value Date   BUN 13 09/20/2018   BUN 13 09/05/2018   CREATININE 0.80 09/20/2018   CREATININE 0.81 09/05/2018  On Entresto.  - + HL; last set of lipids: Lab Results  Component Value Date   CHOL 169 07/31/2018   HDL 60 07/31/2018   LDLCALC 82 07/31/2018   TRIG 133 07/31/2018   CHOLHDL 2.8 07/31/2018  Started Repatha.  - last eye exam was in 11/2017: No DR. - + numbness and tingling in her feet. On ASA 81.  Pt has FH of DM in PGM, P aunts and uncles, M.  She also has a history of MGUs, OSA, hypothyroidism.  She is on levothyroxine 50 mcg daily.  Last TSH was normal: Lab Results  Component Value Date   TSH 2.480 03/14/2018    ROS: Constitutional: no weight gain/no weight loss, no fatigue, no subjective hyperthermia, no subjective hypothermia Eyes: no blurry vision, no xerophthalmia ENT: no sore throat, no nodules palpated in neck, no dysphagia, no odynophagia, no hoarseness Cardiovascular: no CP/no SOB/no palpitations/no leg swelling Respiratory: no cough/no SOB/+ wheezing Gastrointestinal: no N/no V/no D/no C/no acid reflux Musculoskeletal: no muscle aches/no joint aches Skin: no rashes, no hair loss Neurological: no tremors/no numbness/no tingling/no dizziness  I reviewed pt's medications, allergies,  PMH, social hx, family hx, and changes were documented in the history of present illness. Otherwise, unchanged from my initial visit note.  Past Medical History:  Diagnosis Date  . Allergy   . Bilateral shoulder pain   . Borderline hypertension   . Bronchitis   . Diabetes mellitus    Diagnosed in 2000  . Hyperlipidemia   . Obesity   . Sleep apnea    Past Surgical History:  Procedure Laterality Date  . BREATH TEK H PYLORI  09/18/2011   Procedure: BREATH TEK H PYLORI;  Surgeon: Shann Medal, MD;  Location: [facility];  Service: General;  Laterality: N/A;  . CESAREAN SECTION     x 3   . LAPAROSCOPIC APPENDECTOMY N/A 10/03/2017   Procedure: APPENDECTOMY LAPAROSCOPIC;  Surgeon: Leighton Ruff, MD;  Location: [facility];  Service: General;  Laterality: N/A;  . RIGHT/LEFT HEART CATH AND CORONARY ANGIOGRAPHY N/A 02/12/2018   Procedure: RIGHT/LEFT HEART CATH AND CORONARY ANGIOGRAPHY;  Surgeon: Jettie Booze, MD;  Location: [facility];  Service: Cardiovascular;  Laterality: N/A;   Social History   Socioeconomic History  . Marital status: Married    Spouse name: Not on file  . Number of children: Not on file  . Years of education: Not on file  . Highest education level: Not on file  Occupational History  . Not on file  Social Needs  . Financial resource strain: Not on file  . Food insecurity:    Worry: Not on file    Inability: Not on file  . Transportation needs:    Medical: Not on file    Non-medical: Not on file  Tobacco Use  . Smoking status: Former Smoker    Packs/day: 1.00    Years: 15.00    Pack years: 15.00    Types: Cigarettes    Last attempt to quit: 01/28/1990    Years since quitting: 28.9  . Smokeless tobacco: Never Used  Substance and Sexual Activity  . Alcohol use: No  . Drug use: No  . Sexual activity: Not Currently  Lifestyle  . Physical activity:    Days per week: Not on file    Minutes per session: Not on file  . Stress: Not on file  Relationships  . Social connections:    Talks on phone: Not on file    Gets together: Not on file    Attends religious service: Not on file    Active member of club or organization: Not on file    Attends meetings of clubs or organizations: Not on file    Relationship status: Not on file  . Intimate partner violence:    Fear of current or ex partner: Not on file    Emotionally abused: Not on file    Physically abused: Not on file    Forced sexual activity: Not on file  Other Topics Concern  . 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 albuterol (PROAIR HFA) 108 (90 Base) MCG/ACT inhaler Inhale 2 puffs into the lungs every 6 (six) hours as needed. wheezing 18 g 2  . ASPIRIN ADULT LOW STRENGTH 81 MG EC tablet TAKE 1 TABLET (81 MG TOTAL) BY MOUTH DAILY. 30 tablet 3  . bismuth subsalicylate (PEPTO BISMOL) 262 MG chewable tablet Chew 524 mg by  mouth as needed for indigestion or diarrhea or loose stools.     . carvedilol (COREG) 12.5 MG tablet Take 1 tablet (12.5 mg total) by mouth 2 (two) times daily. 60 tablet 5  . cetirizine (ZYRTEC) 10 MG tablet Take 10 mg by mouth daily.    . Evolocumab (REPATHA SURECLICK) 062 MG/ML SOAJ Inject 1 pen into the skin every 14 (fourteen) days. 2 pen 11  . fluticasone (FLONASE) 50 MCG/ACT nasal spray Place 2 sprays into both nostrils daily as needed for allergies or rhinitis.    . furosemide (LASIX) 40 MG tablet Take 1 tablet (40 mg total) by mouth 2 (two) times daily. 180 tablet 3  . Insulin Degludec (TRESIBA FLEXTOUCH) 200 UNIT/ML SOPN Inject 18 Units into the skin daily. 3 pen 5  . Insulin Pen Needle 32G X 4 MM MISC Use 2x a day 200 each 3  . levothyroxine (SYNTHROID) 50 MCG tablet Take 1 tablet (50 mcg total) by mouth daily before breakfast. 90 tablet 3  . magnesium oxide (MAG-OX) 400 MG tablet Take 400 mg by mouth at bedtime as needed (for cramps).    . metFORMIN (GLUCOPHAGE) 1000 MG tablet Take 1 tablet (1,000 mg total) by mouth 2 (two) times daily with a meal. 180 tablet 3  . Multiple Vitamin (MULITIVITAMIN WITH MINERALS) TABS Take 1 tablet by mouth daily with breakfast.    . sacubitril-valsartan (ENTRESTO) 49-51 MG Take 1 tablet by mouth 2 (two) times daily. 60 tablet 6  . spironolactone (ALDACTONE) 25 MG tablet Take 1 tablet (25 mg total) by mouth daily. 90 tablet 3  . TRULICITY 1.5 IR/4.8NI SOPN INJECT 1 SYRINGE INTO THE SKIN ONCE A WEEK AS DIRECTED 2 mL 0   No current  facility-administered medications on file prior to visit.    Allergies  Allergen Reactions  . Amoxicillin Other (See Comments)    Dizziness, abdominal pain  . Adhesive [Tape] Rash  . Exenatide Other (See Comments)    Flu-like symptoms  . Invokana [Canagliflozin] Other (See Comments)    Yeast infection/dehydration  . Lisinopril Other (See Comments)    cramping  . Other Diarrhea and Other (See Comments)    Kale=food  Per patient, it causes GI bleeding   . Statins Other (See Comments)    Leg cramps   Family History  Problem Relation Age of Onset  . Alcohol abuse Mother   . Arthritis Mother   . Diabetes Mother   . Sleep apnea Mother   . Alcohol abuse Father   . Heart disease Father   . Sleep apnea Sister     PE: There were no vitals taken for this visit. Wt Readings from Last 3 Encounters:  09/16/18 294 lb 12.8 oz (133.7 kg)  09/10/18 297 lb 12.8 oz (135.1 kg)  07/24/18 294 lb 1.6 oz (133.4 kg)   Constitutional:  in NAD  The physical exam was not performed (virtual visit).  ASSESSMENT: 1. DM2, non-insulin-dependent, uncontrolled, with long-term complications - PVD - CAD, sCHF  2. HL  3.  Obesity  PLAN:  1. Patient with longstanding, uncontrolled, type 2 diabetes, previously only on metformin, with very poor control.  HbA1c was 14% in 12/2017 and 11.9% in 06/2018 (our initial visit).  At that time, we discussed that she was gluco-toxic and we added long-acting insulin and weekly GLP-1 receptor agonist.  We continued metformin.  Of note, she had several medication intolerances over the year and was previously on insulin, which she stopped due to weight  gain. -At this visit, sugars are still high in the morning, only slightly lower than before, and she unfortunately does not check sugars later in the day.  Since last visit she again stopped insulin due to weight gain.  She would not like to restart.  I am doubtful that we can improve her diabetes control significantly  without insulin for now... I explained that we need to drop her a.m. sugars by approximately 100 mg/dL which is difficult without insulin unless she changes her diet significantly.  She continues to work on her diet now.  Because she also has a history of CHF, we will try to add an SGLT 2 inhibitor.  She had yeast infections with Invokana so we will start a low dose Jardiance.  We will continue the current dose of Lasix.  If she does not have yeast infections, I plan to increase the dose of Jardiance at next visit. -For now we will continue metformin and Trulicity. - I suggested to:  Patient Instructions  Please continue: - Metformin 1000 mg 2x a day with meals - Trulicity 1.5 mg weekly  Please start: - Jardiance 10 mg before breakfast  Please return in 3 months with your sugar log.   - we will check her HbA1c when she comes to the clinic - check sugars at different times of the day - check 1-2x a day, rotating checks - advised for yearly eye exams >> she is up-to-date, but due soon. - Return to clinic in 3 mo with sugar log   2. HL - Reviewed latest lipid panel from 07/2018: All fractions at goal Lab Results  Component Value Date   CHOL 169 07/31/2018   HDL 60 07/31/2018   LDLCALC 82 07/31/2018   TRIG 133 07/31/2018   CHOLHDL 2.8 07/31/2018  - Continues Repatha without side effects.  She could not tolerate statins due to leg cramps.  3.  Obesity -She unfortunately stopped insulin due to increased weight -Continue GLP-1 receptor agonist and we will also try to add an SGLT 2 inhibitor which should also help with weight loss.  Philemon Kingdom, MD PhD [facility]

## 2018-12-20 ENCOUNTER — Other Ambulatory Visit (HOSPITAL_COMMUNITY): Payer: Self-pay | Admitting: Cardiology

## 2018-12-20 MED FILL — ENTRESTO 49 MG-51 MG TABLET: 49-51 | 30 days supply | Qty: 60 | Fill #0

## 2018-12-20 MED FILL — ASPIRIN LOW DOSE 81 MG CHEW: 81 | 90 days supply | Qty: 90 | Fill #0

## 2018-12-20 MED FILL — JARDIANCE 10 MG TABLET: 10 | 30 days supply | Qty: 30 | Fill #0

## 2019-01-14 ENCOUNTER — Other Ambulatory Visit (HOSPITAL_COMMUNITY): Payer: Self-pay | Admitting: Cardiology

## 2019-01-14 ENCOUNTER — Other Ambulatory Visit: Payer: Self-pay | Admitting: Internal Medicine

## 2019-01-14 MED FILL — TRULICITY 1.5 MG/0.5 ML PEN: 1.5 | 28 days supply | Qty: 2 | Fill #0

## 2019-01-14 MED FILL — SPIRONOLACTONE 25 MG TABS: 25 | 90 days supply | Qty: 90 | Fill #0

## 2019-01-14 MED FILL — JARDIANCE 10 MG TABLET: 10 | 30 days supply | Qty: 30 | Fill #1

## 2019-01-14 MED FILL — metFORMIN HCL 1000 MG TABS: 1000 | 90 days supply | Qty: 180 | Fill #0

## 2019-01-14 MED FILL — CARVEDILOL 12.5 MG TABLET: 12.5 | 60 days supply | Qty: 60 | Fill #0

## 2019-01-14 MED FILL — ENTRESTO 49 MG-51 MG TABLET: 49-51 | 30 days supply | Qty: 60 | Fill #1

## 2019-01-14 MED FILL — REPATHA SURECLICK 140 MG/ML: 140 | 28 days supply | Qty: 2 | Fill #2

## 2019-01-22 ENCOUNTER — Ambulatory Visit: Payer: 59 | Admitting: Family Medicine

## 2019-01-23 ENCOUNTER — Other Ambulatory Visit: Payer: Self-pay | Admitting: Interventional Cardiology

## 2019-01-23 MED FILL — LEVOTHYROXINE 50 MCG TABLET: 50 | 90 days supply | Qty: 90 | Fill #0

## 2019-01-28 ENCOUNTER — Ambulatory Visit: Payer: 59 | Admitting: Family Medicine

## 2019-01-31 DIAGNOSIS — H524 Presbyopia: Secondary | ICD-10-CM | POA: Diagnosis not present

## 2019-01-31 DIAGNOSIS — H52223 Regular astigmatism, bilateral: Secondary | ICD-10-CM | POA: Diagnosis not present

## 2019-02-07 ENCOUNTER — Telehealth: Payer: Self-pay | Admitting: Family Medicine

## 2019-02-07 ENCOUNTER — Ambulatory Visit: Payer: 59 | Admitting: Family Medicine

## 2019-02-07 NOTE — Telephone Encounter (Signed)
Pt would like a note stating that her cpe was rescheduled by her doctor because she will not meet the deadline for insurance. She would like this letter dropped in her mychart.

## 2019-02-07 NOTE — Telephone Encounter (Signed)
Called pt spoke with husband MIchaeal per DPR LM for Ommie to call us back and schedule an appt . With Dr.Stallings    FR

## 2019-02-10 MED FILL — CARVEDILOL 12.5 MG TABLET: 12.5 | 30 days supply | Qty: 60 | Fill #0

## 2019-02-12 NOTE — Telephone Encounter (Signed)
Please advise 

## 2019-02-14 ENCOUNTER — Other Ambulatory Visit: Payer: Self-pay | Admitting: Internal Medicine

## 2019-02-14 MED FILL — ENTRESTO 49 MG-51 MG TABLET: 49-51 | 30 days supply | Qty: 60 | Fill #0

## 2019-02-14 MED FILL — REPATHA SURECLICK 140 MG/ML: 140 | 28 days supply | Qty: 2 | Fill #0

## 2019-02-14 MED FILL — JARDIANCE 10 MG TABLET: 10 | 30 days supply | Qty: 30 | Fill #0

## 2019-02-14 MED FILL — TRULICITY 1.5 MG/0.5 ML PEN: 1.5 | 28 days supply | Qty: 2 | Fill #0

## 2019-02-21 ENCOUNTER — Telehealth: Payer: Self-pay | Admitting: Pulmonary Disease

## 2019-02-21 DIAGNOSIS — R197 Diarrhea, unspecified: Secondary | ICD-10-CM

## 2019-02-21 MED ORDER — ALBUTEROL SULFATE HFA 108 (90 BASE) MCG/ACT IN AERS: 2.0000 | INHALATION_SPRAY | Freq: Four times a day (QID) | RESPIRATORY_TRACT | 2 refills | Status: DC | PRN

## 2019-02-21 MED FILL — ALBUTEROL SULFATE HFA 108 (: 108 (90 BAS | 25 days supply | Qty: 9 | Fill #0

## 2019-02-21 NOTE — Telephone Encounter (Signed)
Spoke with patient. She stated that she needed a refill on her albuterol inhaler to be sent to [facility]. Advised her that I would go ahead and send this in for her. She verbalized understanding.   Nothing further needed at time of call.

## 2019-03-05 ENCOUNTER — Encounter: Payer: 59 | Admitting: Family Medicine

## 2019-03-13 ENCOUNTER — Encounter: Payer: Self-pay | Admitting: Adult Health

## 2019-03-13 ENCOUNTER — Ambulatory Visit: Payer: 59 | Admitting: Adult Health

## 2019-03-13 DIAGNOSIS — J309 Allergic rhinitis, unspecified: Secondary | ICD-10-CM | POA: Diagnosis not present

## 2019-03-13 DIAGNOSIS — I5022 Chronic systolic (congestive) heart failure: Secondary | ICD-10-CM | POA: Diagnosis not present

## 2019-03-13 DIAGNOSIS — J45909 Unspecified asthma, uncomplicated: Secondary | ICD-10-CM | POA: Diagnosis not present

## 2019-03-13 DIAGNOSIS — G4733 Obstructive sleep apnea (adult) (pediatric): Secondary | ICD-10-CM

## 2019-03-13 NOTE — Assessment & Plan Note (Signed)
Excellent control and compliance on CPAP   Plan  Patient Instructions  Continue on CPAP at bedtime Work on healthy weight Do not drive if sleepy Continue on Zyrtec and Flonase daily As needed   Use albuterol 2 puffs every 4 hours as needed for cough and wheezing follow up with Dr. Elsworth Soho  In 1 year  and As needed   Please contact office for sooner follow up if symptoms do not improve or worsen or seek emergency care

## 2019-03-13 NOTE — Patient Instructions (Addendum)
Continue on CPAP at bedtime Work on healthy weight Do not drive if sleepy Continue on Zyrtec and Flonase daily As needed   Use albuterol 2 puffs every 4 hours as needed for cough and wheezing follow up with Dr. Elsworth Soho  In 1 year  and As needed   Please contact office for sooner follow up if symptoms do not improve or worsen or seek emergency care

## 2019-03-13 NOTE — Assessment & Plan Note (Signed)
Wt loss  

## 2019-03-13 NOTE — Progress Notes (Signed)
@Patient  ID: Terri Heath, female    DOB: 05/24/1956, 63 y.o.   MRN: [id number]  Chief Complaint  Patient presents with  . Follow-up    OSA     Referring provider: [name], MD  HPI: 63 year old female former smoker followed for obstructive sleep apnea and mild intermittent asthma versus reactive airways disease. Works at [employer] history significant for nonischemic cardiomyopathy (dx 05/2018 )    TEST/EVENTS :  PSG 12/2007 (335 lbs) showed AHI 26/h with severe in REM, moderate PLMs. She has gained about 30 pounds since the study.  Download 10/8 -07/03/11 >>good complaince ,GOod control of events on 12 cm  SPirometry showed FEv1 64% with ratio of 72, FVC was 71% - mild restriction 2019 Spirometry today shows more restrictive pattern with FEV1 53%, ratio 72, FVC 57 Echo 2019 showed EF 25 to 30% with RVSP at 50. Cardiac cath showed a 30% RCA lesion, 2% circumflex lesion, PA P 42 mmHg.   03/13/2019 Follow up : OSA , Asthma  Patient returns for a one-year follow-up.  Patient has underlying obstructive sleep apnea.  Is on nocturnal CPAP.  Says she is doing well on CPAP.  She wears it each night.  Feels that she benefits from CPAP with no significant daytime sleepiness.  Download shows excellent compliance with daily average usage at 5.5 hr. AHI 0.8.   Patient has mild intermittent asthma.  Says overall breathing has been doing well . Uses albuterol on occasion.  She has some chronic rhinitis is on Zyrtec and Flonase As needed    Patient has known nonischemic cardiopathy, congestive heart failure with previous echo 2019 showed an EF of 25 to 30%.  She is on Lasix 40 mg twice daily and Entresto . says breathing is doing very well since starting on her current regimen . No increased leg swelling .       Allergies  Allergen Reactions  . Amoxicillin Other (See Comments)    Dizziness, abdominal pain  . Adhesive [Tape] Rash  . Exenatide Other (See  Comments)    Flu-like symptoms  . Invokana [Canagliflozin] Other (See Comments)    Yeast infection/dehydration  . Lisinopril Other (See Comments)    cramping  . Other Diarrhea and Other (See Comments)    Kale=food  Per patient, it causes GI bleeding   . Statins Other (See Comments)    Leg cramps    Immunization History  Administered Date(s) Administered  . Influenza Split 05/23/2011, 05/09/2012  . Influenza,inj,Quad PF,6+ Mos 05/07/2017  . MMR 11/29/2000  . Pneumococcal Polysaccharide-23 08/25/2005  . Td 08/25/2005    Past Medical History:  Diagnosis Date  . Allergy   . Bilateral shoulder pain   . Borderline hypertension   . Bronchitis   . Diabetes mellitus    Diagnosed in 2000  . Hyperlipidemia   . Obesity   . Sleep apnea     Tobacco History: Social History   Tobacco Use  Smoking Status Former Smoker  . Packs/day: 1.00  . Years: 15.00  . Pack years: 15.00  . Types: Cigarettes  . Quit date: 01/28/1990  . Years since quitting: 29.1  Smokeless Tobacco Never Used   Counseling given: Not Answered   Outpatient Medications Prior to Visit  Medication Sig Dispense Refill  . albuterol (PROAIR HFA) 108 (90 Base) MCG/ACT inhaler Inhale 2 puffs into the lungs every 6 (six) hours as needed. wheezing 18 g 2  . ASPIRIN ADULT LOW STRENGTH 81  MG EC tablet TAKE 1 TABLET (81 MG TOTAL) BY MOUTH DAILY. 90 tablet 1  . bismuth subsalicylate (PEPTO BISMOL) 262 MG chewable tablet Chew 524 mg by mouth as needed for indigestion or diarrhea or loose stools.     . carvedilol (COREG) 12.5 MG tablet TAKE 1 TABLET BY MOUTH 2 TIMES DAILY. 60 tablet 3  . cetirizine (ZYRTEC) 10 MG tablet Take 10 mg by mouth daily.    . Evolocumab (REPATHA SURECLICK) 324 MG/ML SOAJ Inject 1 pen into the skin every 14 (fourteen) days. 2 pen 11  . fluticasone (FLONASE) 50 MCG/ACT nasal spray Place 2 sprays into both nostrils daily as needed for allergies or rhinitis.    . furosemide (LASIX) 40 MG tablet Take 1  tablet (40 mg total) by mouth 2 (two) times daily. 180 tablet 3  . Insulin Pen Needle 32G X 4 MM MISC Use 2x a day 200 each 3  . levothyroxine (SYNTHROID) 50 MCG tablet TAKE 1 TABLET BY MOUTH DAILY BEFORE BREAKFAST. 90 tablet 3  . magnesium oxide (MAG-OX) 400 MG tablet Take 400 mg by mouth at bedtime as needed (for cramps).    . metFORMIN (GLUCOPHAGE) 1000 MG tablet Take 1 tablet (1,000 mg total) by mouth 2 (two) times daily with a meal. 180 tablet 3  . Multiple Vitamin (MULITIVITAMIN WITH MINERALS) TABS Take 1 tablet by mouth daily with breakfast.    . sacubitril-valsartan (ENTRESTO) 49-51 MG Take 1 tablet by mouth 2 (two) times daily. 60 tablet 6  . spironolactone (ALDACTONE) 25 MG tablet Take 1 tablet (25 mg total) by mouth daily. 90 tablet 3  . TRULICITY 1.5 MW/1.0UV SOPN INJECT 1 SYRINGE INTO THE SKIN ONCE A WEEK AS DIRECTED 2 mL 0  . empagliflozin (JARDIANCE) 10 MG TABS tablet Take 10 mg by mouth daily. Before b'fast 30 tablet 5  . Insulin Degludec (TRESIBA FLEXTOUCH) 200 UNIT/ML SOPN Inject 18 Units into the skin daily. 3 pen 5   No facility-administered medications prior to visit.      Review of Systems:   Constitutional:   No  weight loss, night sweats,  Fevers, chills,  +fatigue, or  lassitude.  HEENT:   No headaches,  Difficulty swallowing,  Tooth/dental problems, or  Sore throat,                No sneezing, itching, ear ache, nasal congestion, post nasal drip,   CV:  No chest pain,  Orthopnea, PND, swelling in lower extremities, anasarca, dizziness, palpitations, syncope.   GI  No heartburn, indigestion, abdominal pain, nausea, vomiting, diarrhea, change in bowel habits, loss of appetite, bloody stools.   Resp: No shortness of breath with exertion or at rest.  No excess mucus, no productive cough,  No non-productive cough,  No coughing up of blood.  No change in color of mucus.  No wheezing.  No chest wall deformity  Skin: no rash or lesions.  GU: no dysuria, change in  color of urine, no urgency or frequency.  No flank pain, no hematuria   MS:  No joint pain or swelling.  No decreased range of motion.  No back pain.    Physical Exam  BP 124/78   Pulse 90   Temp 97.8 F (36.6 C) (Oral)   Ht 5' 7"  (1.702 m)   Wt (!) 304 lb 3.2 oz (138 kg)   SpO2 97%   BMI 47.64 kg/m   GEN: A/Ox3; pleasant , NAD, well nourished    HEENT:  [place]/AT,  EACs-clear, TMs-wnl, NOSE-clear, THROAT-clear, no lesions, no postnasal drip or exudate noted.   NECK:  Supple w/ fair ROM; no JVD; normal carotid impulses w/o bruits; no thyromegaly or nodules palpated; no lymphadenopathy.    RESP  Clear  P & A; w/o, wheezes/ rales/ or rhonchi. no accessory muscle use, no dullness to percussion  CARD:  RRR, no m/r/g, no peripheral edema, pulses intact, no cyanosis or clubbing.  GI:   Soft & nt; nml bowel sounds; no organomegaly or masses detected.   Musco: Warm bil, no deformities or joint swelling noted.   Neuro: alert, no focal deficits noted.    Skin: Warm, no lesions or rashes    Lab Results:  CBC    Component Value Date/Time   WBC 8.7 09/05/2018 0803   WBC 6.7 01/14/2018 1446   RBC 4.72 09/05/2018 0803   HGB 13.6 09/05/2018 0803   HGB 15.0 02/07/2018 1207   HCT 41.1 09/05/2018 0803   HCT 47.3 (H) 02/07/2018 1207   PLT 274 09/05/2018 0803   PLT 220 02/07/2018 1207   MCV 87.1 09/05/2018 0803   MCV 83 02/07/2018 1207   MCH 28.8 09/05/2018 0803   MCHC 33.1 09/05/2018 0803   RDW 12.4 09/05/2018 0803   RDW 15.8 (H) 02/07/2018 1207   LYMPHSABS 3.0 09/05/2018 0803   LYMPHSABS 2.8 10/23/2017 1752   MONOABS 0.5 09/05/2018 0803   EOSABS 0.3 09/05/2018 0803   EOSABS 0.2 10/23/2017 1752   BASOSABS 0.0 09/05/2018 0803   BASOSABS 0.0 10/23/2017 1752    BMET    Component Value Date/Time   NA 135 09/20/2018 0851   NA 137 03/14/2018 1233   K 4.5 09/20/2018 0851   CL 96 (L) 09/20/2018 0851   CO2 27 09/20/2018 0851   GLUCOSE 298 (H) 09/20/2018 0851   BUN 13  09/20/2018 0851   BUN 18 03/14/2018 1233   CREATININE 0.80 09/20/2018 0851   CREATININE 0.81 09/05/2018 0803   CREATININE 0.70 03/14/2016 1228   CALCIUM 9.5 09/20/2018 0851   GFRNONAA >60 09/20/2018 0851   GFRNONAA >60 09/05/2018 0803   GFRNONAA >89 03/14/2016 1228   GFRAA >60 09/20/2018 0851   GFRAA >60 09/05/2018 0803   GFRAA >89 03/14/2016 1228    BNP No results found for: BNP  ProBNP    Component Value Date/Time   PROBNP 308.0 (H) 01/14/2018 1446    Imaging: No results found.    No flowsheet data found.  Lab Results  Component Value Date   NITRICOXIDE 10 01/14/2018        Assessment & Plan:   OSA (obstructive sleep apnea) Excellent control and compliance on CPAP   Plan  Patient Instructions  Continue on CPAP at bedtime Work on healthy weight Do not drive if sleepy Continue on Zyrtec and Flonase daily As needed   Use albuterol 2 puffs every 4 hours as needed for cough and wheezing follow up with Dr. Elsworth Soho  In 1 year  and As needed   Please contact office for sooner follow up if symptoms do not improve or worsen or seek emergency care       Morbid obesity, weight 368, BMI 57.7. Wt loss   Chronic systolic congestive heart failure, NYHA class 3 ([place]) Appears compensated continue follow-up with CHF clinic  Asthma due to seasonal allergies Mild intermittent asthma continue trigger prevention continue on current regimen  Allergic rhinitis Currently compensated continue on Flonase and Zyrtec as needed     [employer],  NP 03/13/2019

## 2019-03-13 NOTE — Assessment & Plan Note (Signed)
Mild intermittent asthma continue trigger prevention continue on current regimen

## 2019-03-13 NOTE — Assessment & Plan Note (Signed)
Appears compensated continue follow-up with CHF clinic

## 2019-03-13 NOTE — Assessment & Plan Note (Signed)
Currently compensated continue on Flonase and Zyrtec as needed

## 2019-03-14 ENCOUNTER — Other Ambulatory Visit: Payer: Self-pay | Admitting: Internal Medicine

## 2019-03-14 ENCOUNTER — Other Ambulatory Visit: Payer: Self-pay | Admitting: Interventional Cardiology

## 2019-03-14 MED FILL — REPATHA SURECLICK 140 MG/ML: 140 | 28 days supply | Qty: 2 | Fill #1

## 2019-03-14 MED FILL — TRULICITY 1.5 MG/0.5 ML PEN: 1.5 | 28 days supply | Qty: 2 | Fill #0

## 2019-03-14 MED FILL — FUROSEMIDE 40 MG TAB: 40 | 30 days supply | Qty: 60 | Fill #0

## 2019-03-14 MED FILL — CARVEDILOL 12.5 MG TABLET: 12.5 | 30 days supply | Qty: 60 | Fill #1

## 2019-03-14 MED FILL — ENTRESTO 49 MG-51 MG TABLET: 49-51 | 30 days supply | Qty: 60 | Fill #1

## 2019-03-18 ENCOUNTER — Ambulatory Visit: Payer: 59 | Admitting: Internal Medicine

## 2019-03-25 DIAGNOSIS — H2513 Age-related nuclear cataract, bilateral: Secondary | ICD-10-CM | POA: Diagnosis not present

## 2019-03-25 DIAGNOSIS — E113211 Type 2 diabetes mellitus with mild nonproliferative diabetic retinopathy with macular edema, right eye: Secondary | ICD-10-CM | POA: Diagnosis not present

## 2019-03-25 DIAGNOSIS — E113292 Type 2 diabetes mellitus with mild nonproliferative diabetic retinopathy without macular edema, left eye: Secondary | ICD-10-CM | POA: Diagnosis not present

## 2019-03-27 DIAGNOSIS — G4733 Obstructive sleep apnea (adult) (pediatric): Secondary | ICD-10-CM | POA: Diagnosis not present

## 2019-04-11 ENCOUNTER — Encounter: Payer: Self-pay | Admitting: Family Medicine

## 2019-04-11 ENCOUNTER — Other Ambulatory Visit (HOSPITAL_COMMUNITY): Payer: Self-pay | Admitting: Cardiology

## 2019-04-11 ENCOUNTER — Other Ambulatory Visit (HOSPITAL_COMMUNITY)
Admission: RE | Admit: 2019-04-11 | Discharge: 2019-04-11 | Disposition: A | Discharge status: Home / Self Care | Payer: 59 | Source: Ambulatory Visit | Attending: Family Medicine | Admitting: Family Medicine

## 2019-04-11 ENCOUNTER — Other Ambulatory Visit: Payer: Self-pay | Admitting: Internal Medicine

## 2019-04-11 ENCOUNTER — Other Ambulatory Visit: Payer: Self-pay | Admitting: Interventional Cardiology

## 2019-04-11 ENCOUNTER — Ambulatory Visit (INDEPENDENT_AMBULATORY_CARE_PROVIDER_SITE_OTHER): Payer: 59 | Admitting: Family Medicine

## 2019-04-11 ENCOUNTER — Other Ambulatory Visit: Payer: Self-pay

## 2019-04-11 VITALS — BP 115/76 | HR 90 | Temp 98.4°F | Resp 17 | Ht 67.0 in | Wt 308.2 lb

## 2019-04-11 DIAGNOSIS — E039 Hypothyroidism, unspecified: Secondary | ICD-10-CM

## 2019-04-11 DIAGNOSIS — Z299 Encounter for prophylactic measures, unspecified: Secondary | ICD-10-CM | POA: Diagnosis not present

## 2019-04-11 DIAGNOSIS — Z794 Long term (current) use of insulin: Secondary | ICD-10-CM | POA: Diagnosis not present

## 2019-04-11 DIAGNOSIS — Z1211 Encounter for screening for malignant neoplasm of colon: Secondary | ICD-10-CM

## 2019-04-11 DIAGNOSIS — Z1159 Encounter for screening for other viral diseases: Secondary | ICD-10-CM | POA: Diagnosis not present

## 2019-04-11 DIAGNOSIS — L820 Inflamed seborrheic keratosis: Secondary | ICD-10-CM

## 2019-04-11 DIAGNOSIS — E119 Type 2 diabetes mellitus without complications: Secondary | ICD-10-CM

## 2019-04-11 DIAGNOSIS — Z124 Encounter for screening for malignant neoplasm of cervix: Secondary | ICD-10-CM | POA: Diagnosis not present

## 2019-04-11 DIAGNOSIS — Z5181 Encounter for therapeutic drug level monitoring: Secondary | ICD-10-CM | POA: Diagnosis not present

## 2019-04-11 DIAGNOSIS — Z23 Encounter for immunization: Secondary | ICD-10-CM

## 2019-04-11 DIAGNOSIS — E785 Hyperlipidemia, unspecified: Secondary | ICD-10-CM

## 2019-04-11 DIAGNOSIS — Z1239 Encounter for other screening for malignant neoplasm of breast: Secondary | ICD-10-CM | POA: Diagnosis not present

## 2019-04-11 DIAGNOSIS — Z0000 Encounter for general adult medical examination without abnormal findings: Secondary | ICD-10-CM | POA: Diagnosis not present

## 2019-04-11 LAB — POCT GLYCOSYLATED HEMOGLOBIN (HGB A1C): Hemoglobin A1C: 14 % — AB (ref 4.0–5.6)

## 2019-04-11 MED FILL — CARVEDILOL 12.5 MG TABLET: 12.5 | 30 days supply | Qty: 60 | Fill #2

## 2019-04-11 MED FILL — ENTRESTO 49 MG-51 MG TABLET: 49-51 | 30 days supply | Qty: 60 | Fill #0

## 2019-04-11 MED FILL — ALBUTEROL SULFATE HFA 108 (: 108 (90 BAS | 25 days supply | Qty: 9 | Fill #1

## 2019-04-11 MED FILL — TRULICITY 1.5 MG/0.5 ML PEN: 1.5 | 28 days supply | Qty: 2 | Fill #0

## 2019-04-11 MED FILL — FUROSEMIDE 40 MG TAB: 40 | 15 days supply | Qty: 30 | Fill #0

## 2019-04-11 MED FILL — LEVOTHYROXINE 50 MCG TABLET: 50 | 90 days supply | Qty: 90 | Fill #1

## 2019-04-11 NOTE — Progress Notes (Signed)
Chief Complaint  Patient presents with  . Annual Exam    cpe    Subjective:  Terri Heath is a 63 y.o. female here for a health maintenance visit.  Patient is established pt  Hypothyroidism: Patient presents for evaluation of thyroid function. The problem has been stable.  Previous thyroid studies include TSH. The hypothyroidism is due to hypothyroidism. She is taking her thyroid medication by itself first thing in the morning and waits an hour before eating or taking meds.  She denies palpitations, jittery sensation, diarrhea, or skin changes, no mood changes.  Dyslipidemia Patient is on repatha for her dyslipidemia She could not tolerate statins She has a history of heart disease She states that she is doing much better Lab Results  Component Value Date   CHOL 184 04/11/2019   CHOL 169 07/31/2018   CHOL 266 (H) 06/06/2018   Lab Results  Component Value Date   HDL 57 04/11/2019   HDL 60 07/31/2018   HDL 57 06/06/2018   Lab Results  Component Value Date   LDLCALC 92 04/11/2019   LDLCALC 82 07/31/2018   LDLCALC 187 (H) 06/06/2018   Lab Results  Component Value Date   TRIG 174 (H) 04/11/2019   TRIG 133 07/31/2018   TRIG 111 06/06/2018   Lab Results  Component Value Date   CHOLHDL 3.2 04/11/2019   CHOLHDL 2.8 07/31/2018   CHOLHDL 4.7 (H) 06/06/2018   No results found for: LDLDIRECT   Patient Active Problem List   Diagnosis Date Noted  . Chronic systolic congestive heart failure, NYHA class 3 ([place]) 07/24/2018  . Chronic systolic congestive heart failure ([place]) 07/24/2018  . Acquired hypothyroidism 07/24/2018  . Cardiomyopathy, ischemic 07/24/2018  . MGUS (monoclonal gammopathy of unknown significance) 03/06/2018  . Acute systolic heart failure ([place])   . Dyspnea 01/14/2018  . Gangrenous appendicitis with perforation s/p lap appendectomy 10/04/2017 10/04/2017  . Allergic rhinitis 06/11/2014  . Depression 09/15/2013  . Asthma due to seasonal allergies  10/24/2012  . OSA (obstructive sleep apnea) 05/23/2011  . Poorly controlled type 2 diabetes mellitus with circulatory disorder ([place]) 03/26/2007  . Hyperlipidemia 03/26/2007  . Morbid obesity, weight 368, BMI 57.7. 03/26/2007  . Essential hypertension 03/26/2007    Past Medical History:  Diagnosis Date  . Allergy   . Bilateral shoulder pain   . Borderline hypertension   . Bronchitis   . Diabetes mellitus    Diagnosed in 2000  . Hyperlipidemia   . Obesity   . Sleep apnea     Past Surgical History:  Procedure Laterality Date  . BREATH TEK H PYLORI  09/18/2011   Procedure: BREATH TEK H PYLORI;  Surgeon: Shann Medal, MD;  Location: [facility];  Service: General;  Laterality: N/A;  . CESAREAN SECTION     x 3  . LAPAROSCOPIC APPENDECTOMY N/A 10/03/2017   Procedure: APPENDECTOMY LAPAROSCOPIC;  Surgeon: Leighton Ruff, MD;  Location: [facility];  Service: General;  Laterality: N/A;  . RIGHT/LEFT HEART CATH AND CORONARY ANGIOGRAPHY N/A 02/12/2018   Procedure: RIGHT/LEFT HEART CATH AND CORONARY ANGIOGRAPHY;  Surgeon: Jettie Booze, MD;  Location: [facility];  Service: Cardiovascular;  Laterality: N/A;     Outpatient Medications Prior to Visit  Medication Sig Dispense Refill  . albuterol (PROAIR HFA) 108 (90 Base) MCG/ACT inhaler Inhale 2 puffs into the lungs every 6 (six) hours as needed. wheezing 18 g 2  . ASPIRIN ADULT LOW STRENGTH 81 MG EC tablet TAKE 1 TABLET (  81 MG TOTAL) BY MOUTH DAILY. 90 tablet 1  . bismuth subsalicylate (PEPTO BISMOL) 262 MG chewable tablet Chew 524 mg by mouth as needed for indigestion or diarrhea or loose stools.     . carvedilol (COREG) 12.5 MG tablet TAKE 1 TABLET BY MOUTH 2 TIMES DAILY. 60 tablet 3  . cetirizine (ZYRTEC) 10 MG tablet Take 10 mg by mouth daily.    Marland Kitchen ENTRESTO 49-51 MG TAKE 1 TABLET BY MOUTH 2 TIMES DAILY. 60 tablet 1  . Evolocumab (REPATHA SURECLICK) 174 MG/ML SOAJ Inject 1 pen into the skin every 14 (fourteen) days. 2 pen 11   . fluticasone (FLONASE) 50 MCG/ACT nasal spray Place 2 sprays into both nostrils daily as needed for allergies or rhinitis.    Marland Kitchen levothyroxine (SYNTHROID) 50 MCG tablet TAKE 1 TABLET BY MOUTH DAILY BEFORE BREAKFAST. 90 tablet 3  . magnesium oxide (MAG-OX) 400 MG tablet Take 400 mg by mouth at bedtime as needed (for cramps).    . metFORMIN (GLUCOPHAGE) 1000 MG tablet Take 1 tablet (1,000 mg total) by mouth 2 (two) times daily with a meal. 180 tablet 3  . Multiple Vitamin (MULITIVITAMIN WITH MINERALS) TABS Take 1 tablet by mouth daily with breakfast.    . furosemide (LASIX) 40 MG tablet Take 1 tablet (40 mg total) by mouth 2 (two) times daily. Please make overdue appt with Dr. Irish Lack before anymore refills. 1st attempt 60 tablet 0  . TRULICITY 1.5 BS/4.9QP SOPN INJECT 1 SYRINGE INTO THE SKIN ONCE A WEEK AS DIRECTED 2 mL 0  . Insulin Pen Needle 32G X 4 MM MISC Use 2x a day (Patient not taking: Reported on 04/11/2019) 200 each 3  . spironolactone (ALDACTONE) 25 MG tablet Take 1 tablet (25 mg total) by mouth daily. 90 tablet 3   No facility-administered medications prior to visit.     Allergies  Allergen Reactions  . Amoxicillin Other (See Comments)    Dizziness, abdominal pain  . Adhesive [Tape] Rash  . Exenatide Other (See Comments)    Flu-like symptoms  . Invokana [Canagliflozin] Other (See Comments)    Yeast infection/dehydration  . Lisinopril Other (See Comments)    cramping  . Other Diarrhea and Other (See Comments)    Kale=food  Per patient, it causes GI bleeding   . Statins Other (See Comments)    Leg cramps     Family History  Problem Relation Age of Onset  . Alcohol abuse Mother   . Arthritis Mother   . Diabetes Mother   . Sleep apnea Mother   . Alcohol abuse Father   . Heart disease Father   . Sleep apnea Sister      Health Habits: Dental Exam: up to date Eye Exam: up to date Exercise: 3 times/week on average Current exercise activities:  walking/running Diet: balanced   Social History   Socioeconomic History  . Marital status: Married    Spouse name: Not on file  . Number of children: Not on file  . Years of education: Not on file  . Highest education level: Not on file  Occupational History  . Not on file  Social Needs  . Financial resource strain: Not on file  . Food insecurity    Worry: Not on file    Inability: Not on file  . Transportation needs    Medical: Not on file    Non-medical: Not on file  Tobacco Use  . Smoking status: Former Smoker    Packs/day: 1.00  Years: 15.00    Pack years: 15.00    Types: Cigarettes    Quit date: 01/28/1990    Years since quitting: 29.2  . Smokeless tobacco: Never Used  Substance and Sexual Activity  . Alcohol use: No  . Drug use: No  . Sexual activity: Not Currently  Lifestyle  . Physical activity    Days per week: Not on file    Minutes per session: Not on file  . Stress: Not on file  Relationships  . Social Herbalist on phone: Not on file    Gets together: Not on file    Attends religious service: Not on file    Active member of club or organization: Not on file    Attends meetings of clubs or organizations: Not on file    Relationship status: Not on file  . Intimate partner violence    Fear of current or ex partner: Not on file    Emotionally abused: Not on file    Physically abused: Not on file    Forced sexual activity: Not on file  Other Topics Concern  . 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 Packs/day: 1.00  . Years: 15.00  . Pack years: 15.00  . Types: Cigarettes  . Quit date: 01/28/1990  . Years since quitting: 29.2  Smokeless Tobacco Never Used   Social History   Substance and Sexual Activity  Drug Use No    GYN:  Sexual Health Menstrual status: regular menses LMP: No LMP recorded. Patient is postmenopausal. Last pap smear: see HM section History of abnormal pap smears: no Sexually active: not currently Current contraception: n/a  Health Maintenance: See under health Maintenance activity for review of completion dates as well. Immunization History  Administered Date(s) Administered  . Influenza Split 05/23/2011, 05/09/2012  . Influenza,inj,Quad PF,6+ Mos 05/07/2017  . MMR 11/29/2000  . Pneumococcal Polysaccharide-23 08/25/2005  . Td 08/25/2005  . Tdap 04/11/2019      Depression Screen-PHQ2/9 Depression screen [facility] 2/9 04/11/2019 04/11/2019 07/24/2018 02/14/2018 01/24/2018  Decreased Interest 0 0 0 0 0  Down, Depressed, Hopeless 0 0 0 0 0  PHQ - 2 Score 0 0 0 0 0  Altered sleeping - - - - -  Tired, decreased energy - - - - -  Change in appetite - - - - -  Feeling bad or failure about yourself  - - - - -  Trouble concentrating - - - - -  Moving slowly or fidgety/restless - - - - -  Suicidal thoughts - - - - -  PHQ-9 Score - - - - -  Difficult doing work/chores - - - - -     Depression Severity and Treatment Recommendations:  0-4= None  5-9= Mild / Treatment: Support, educate to call if worse; return in one month  10-14= Moderate / Treatment: Support, watchful waiting; Antidepressant or Psycotherapy  15-19= Moderately severe / Treatment: Antidepressant OR Psychotherapy  >= 20 = Major depression, severe / Antidepressant AND Psychotherapy    Review of Systems   ROS  See HPI for ROS as well.   Review of Systems  Constitutional: Negative for activity change, appetite change, chills and fever.  HENT: Negative for congestion, nosebleeds,  trouble swallowing and voice change.   Respiratory: Negative for cough, shortness of breath and wheezing.   Gastrointestinal: Negative for diarrhea, nausea and vomiting.  Genitourinary: Negative for difficulty urinating, dysuria, flank pain and  hematuria.  Musculoskeletal: Negative for back pain, joint swelling and neck pain.  Neurological: Negative for dizziness, speech difficulty, light-headedness and numbness.  See HPI. All other review of systems negative.   Objective:   Vitals:   04/11/19 1023  BP: 115/76  Pulse: 90  Resp: 17  Temp: 98.4 F (36.9 C)  TempSrc: Oral  SpO2: 96%  Weight: (!) 308 lb 3.2 oz (139.8 kg)  Height: 5' 7"  (1.702 m)    Body mass index is 48.27 kg/m.  Physical Exam  BP 115/76 (BP Location: Left Arm, Patient Position: Sitting, Cuff Size: Large)   Pulse 90   Temp 98.4 F (36.9 C) (Oral)   Resp 17   Ht 5' 7"  (1.702 m)   Wt (!) 308 lb 3.2 oz (139.8 kg)   SpO2 96%   BMI 48.27 kg/m   General Appearance:    Alert, cooperative, no distress, appears stated age  Head:    Normocephalic, without obvious abnormality, atraumatic  Eyes:    PERRL, conjunctiva/corneas clear, EOM's intact, fundi    benign, both eyes  Ears:    Normal TM's and external ear canals, both ears  Nose:   Nares normal, septum midline, mucosa normal, no drainage    or sinus tenderness  Throat:   Lips, mucosa, and tongue normal; teeth and gums normal  Neck:   Supple, symmetrical, trachea midline, no adenopathy;    thyroid:  no enlargement/tenderness/nodules  Back:     Symmetric, no curvature, ROM normal, no CVA tenderness  Lungs:     Clear to auscultation bilaterally, respirations unlabored  Chest Wall:    No tenderness or deformity   Heart:    Regular rate and rhythm, S1 and S2 normal, no murmur, rub   or gallop  Breast Exam:    No tenderness, masses, or nipple abnormality, without skin changes  Abdomen:     Soft, non-tender, bowel sounds active all four quadrants,    no masses, no organomegaly  Genitalia:    Normal female without lesion, discharge or tenderness, no CMT, no ovarian masses palpable (exam limited by body habitus and large pannus), pap smear performed  Extremities:   Extremities normal, atraumatic, no  cyanosis or edema  Pulses:   2+ and symmetric all extremities  Skin:   Skin color, texture, turgor normal, no rashes or lesions  Lymph nodes:   Cervical, supraclavicular, and axillary nodes normal  Neurologic:   CNII-XII intact, normal strength, sensation and reflexes    throughout      Assessment/Plan:   Patient was seen for a health maintenance exam.  Counseled the patient on health maintenance issues. Reviewed her health mainteance schedule and ordered appropriate tests (see orders.) Counseled on regular exercise and weight management. Recommend regular eye exams and dental cleaning.   The following issues were addressed today for health maintenance:   Rea was seen today for annual exam.  Diagnoses and all orders for this visit:  Annual physical exam- Women's Health Maintenance Plan Advised monthly breast exam and annual mammogram Advised dental exam every six months Discussed stress management Discussed pap smear screening guidelines  Screening for breast cancer -     MM Digital Screening  Diabetes mellitus type 2, insulin dependent [facility])-  Patient feels subjectively better about her diabetes mgmt  but remains in the uncontrolled range -     HM Diabetes Foot Exam -     Microalbumin, urine -     POCT glycosylated hemoglobin (Hb A1C)  Screening for colon cancer- Patient declines colonoscopy, understanding that it's the better test for detection of colon cancer, but will do COLOGUARD. Use explained.   -     Cologuard  Dyslipidemia- discussed lipid control especially in her diabetic state. Her lipids are improving so far. -     CMP14+EGFR -     Lipid panel  Encounter for medication monitoring -     CMP14+EGFR -     Lipid panel  Encounter for hepatitis C screening test for low risk patient -     HCV Ab w/Rflx to Verification  Encounter for screening for cervical cancer  -     Cytology - PAP([place])  Acquired hypothyroidism -     TSH  Seborrheic  keratoses, inflamed -     Ambulatory referral to Dermatology  Need for prophylactic measure -     Tdap vaccine greater than or equal to 7yo IM  Other orders -     Interpretation:    No follow-ups on file.    Body mass index is 48.27 kg/m.:  Discussed the patient's BMI with patient. The BMI body mass index is 48.27 kg/m.     Future Appointments  Date Time Provider [place]  08/01/2019  8:40 AM [name], MD [facility]CP[facility]PCP [facility]  09/10/2019  7:45 AM [facility] None  09/17/2019 10:15 AM Nicholas Lose, MD [facility] None    Patient Instructions   Wt Readings from Last 3 Encounters:  04/11/19 (!) 308 lb 3.2 oz (139.8 kg)  03/13/19 (!) 304 lb 3.2 oz (138 kg)  09/16/18 294 lb 12.8 oz (133.7 kg)     Lab Results  Component Value Date   CHOL 169 07/31/2018   CHOL 266 (H) 06/06/2018   CHOL 249 (H) 03/14/2016   Lab Results  Component Value Date   HDL 60 07/31/2018   HDL 57 06/06/2018   HDL 61 03/14/2016   Lab Results  Component Value Date   LDLCALC 82 07/31/2018   LDLCALC 187 (H) 06/06/2018   LDLCALC 160 (H) 03/14/2016   Lab Results  Component Value Date   TRIG 133 07/31/2018   TRIG 111 06/06/2018   TRIG 142 03/14/2016   Lab Results  Component Value Date   CHOLHDL 2.8 07/31/2018   CHOLHDL 4.7 (H) 06/06/2018   CHOLHDL 4.1 03/14/2016      If you have lab work done today you will be contacted with your lab results within the next 2 weeks.  If you have not heard from [place] then please contact us. The fastest way to get your results is to register for My Chart.   IF you received an x-ray today, you will receive an invoice from [facility]. Please contact [facility] at [phone] with questions or concerns regarding your invoice.   IF you received labwork today, you will receive an invoice from [place]. Please contact [facility] at [phone] with questions or concerns regarding your invoice.   Our billing staff will  not be able to assist you with questions regarding bills from these companies.  You will be contacted with the lab results as soon as they are available. The fastest way to get your results is to activate your My Chart account. Instructions are located on the last page of this paperwork. If  you have not heard from [place] regarding the results in 2 weeks, please contact this office.        If you have lab work done today you will be contacted with your lab results within the next 2 weeks.  If you have not heard from [place] then please contact us. The fastest way to get your results is to register for My Chart.   IF you received an x-ray today, you will receive an invoice from [facility]. Please contact [facility] at [phone] with questions or concerns regarding your invoice.   IF you received labwork today, you will receive an invoice from [place]. Please contact [facility] at [phone] with questions or concerns regarding your invoice.   Our billing staff will not be able to assist you with questions regarding bills from these companies.  You will be contacted with the lab results as soon as they are available. The fastest way to get your results is to activate your My Chart account. Instructions are located on the last page of this paperwork. If you have not heard from [place] regarding the results in 2 weeks, please contact this office.

## 2019-04-11 NOTE — Patient Instructions (Addendum)
Wt Readings from Last 3 Encounters:  04/11/19 (!) 308 lb 3.2 oz (139.8 kg)  03/13/19 (!) 304 lb 3.2 oz (138 kg)  09/16/18 294 lb 12.8 oz (133.7 kg)     Lab Results  Component Value Date   CHOL 169 07/31/2018   CHOL 266 (H) 06/06/2018   CHOL 249 (H) 03/14/2016   Lab Results  Component Value Date   HDL 60 07/31/2018   HDL 57 06/06/2018   HDL 61 03/14/2016   Lab Results  Component Value Date   LDLCALC 82 07/31/2018   LDLCALC 187 (H) 06/06/2018   LDLCALC 160 (H) 03/14/2016   Lab Results  Component Value Date   TRIG 133 07/31/2018   TRIG 111 06/06/2018   TRIG 142 03/14/2016   Lab Results  Component Value Date   CHOLHDL 2.8 07/31/2018   CHOLHDL 4.7 (H) 06/06/2018   CHOLHDL 4.1 03/14/2016      If you have lab work done today you will be contacted with your lab results within the next 2 weeks.  If you have not heard from [place] then please contact us. The fastest way to get your results is to register for My Chart.   IF you received an x-ray today, you will receive an invoice from [facility]. Please contact [facility] at [phone] with questions or concerns regarding your invoice.   IF you received labwork today, you will receive an invoice from [place]. Please contact [facility] at [phone] with questions or concerns regarding your invoice.   Our billing staff will not be able to assist you with questions regarding bills from these companies.  You will be contacted with the lab results as soon as they are available. The fastest way to get your results is to activate your My Chart account. Instructions are located on the last page of this paperwork. If you have not heard from [place] regarding the results in 2 weeks, please contact this office.        If you have lab work done today you will be contacted with your lab results within the next 2 weeks.  If you have not heard from [place] then please contact us. The fastest way to get your results is to  register for My Chart.   IF you received an x-ray today, you will receive an invoice from [facility]. Please contact [facility] at [phone] with questions or concerns regarding your invoice.   IF you received labwork today, you will receive an invoice from [place]. Please contact [facility] at [phone] with questions or concerns regarding your invoice.   Our billing staff will not be able to assist you with questions regarding bills from these companies.  You will be contacted with the lab results as soon as they are available. The fastest way to get your results is to activate your My Chart account. Instructions are located on the last page of this paperwork. If you have not heard from [place] regarding the results in 2 weeks, please contact this office.

## 2019-04-12 LAB — LIPID PANEL
Chol/HDL Ratio: 3.2 ratio (ref 0.0–4.4)
Cholesterol, Total: 184 mg/dL (ref 100–199)
HDL: 57 mg/dL (ref >39)
LDL Calculated: 92 mg/dL (ref 0–99)
Triglycerides: 174 mg/dL — ABNORMAL HIGH (ref 0–149)
VLDL Cholesterol Cal: 35 mg/dL (ref 5–40)

## 2019-04-12 LAB — CMP14+EGFR
ALT: 23 IU/L (ref 0–32)
AST: 18 IU/L (ref 0–40)
Albumin/Globulin Ratio: 1.1 — ABNORMAL LOW (ref 1.2–2.2)
Albumin: 4 g/dL (ref 3.8–4.8)
Alkaline Phosphatase: 91 IU/L (ref 39–117)
BUN/Creatinine Ratio: 20 (ref 12–28)
BUN: 16 mg/dL (ref 8–27)
Bilirubin Total: 0.4 mg/dL (ref 0.0–1.2)
CO2: 25 mmol/L (ref 20–29)
Calcium: 9.5 mg/dL (ref 8.7–10.3)
Chloride: 96 mmol/L (ref 96–106)
Creatinine, Ser: 0.82 mg/dL (ref 0.57–1.00)
GFR calc Af Amer: 89 mL/min/{1.73_m2} (ref >59)
GFR calc non Af Amer: 77 mL/min/{1.73_m2} (ref >59)
Globulin, Total: 3.6 g/dL (ref 1.5–4.5)
Glucose: 302 mg/dL — ABNORMAL HIGH (ref 65–99)
Potassium: 4.4 mmol/L (ref 3.5–5.2)
Sodium: 136 mmol/L (ref 134–144)
Total Protein: 7.6 g/dL (ref 6.0–8.5)

## 2019-04-12 LAB — HCV AB W/RFLX TO VERIFICATION: HCV Ab: <0.1 s/co ratio (ref 0.0–0.9)

## 2019-04-12 LAB — MICROALBUMIN, URINE: Microalbumin, Urine: <3 ug/mL

## 2019-04-12 LAB — HCV INTERPRETATION

## 2019-04-12 LAB — TSH: TSH: 2.92 u[IU]/mL (ref 0.450–4.500)

## 2019-04-15 ENCOUNTER — Other Ambulatory Visit: Payer: Self-pay

## 2019-04-15 ENCOUNTER — Other Ambulatory Visit: Payer: Self-pay | Admitting: Family Medicine

## 2019-04-15 ENCOUNTER — Encounter: Payer: Self-pay | Admitting: Family Medicine

## 2019-04-15 ENCOUNTER — Encounter: Payer: Self-pay | Admitting: Internal Medicine

## 2019-04-15 DIAGNOSIS — E119 Type 2 diabetes mellitus without complications: Secondary | ICD-10-CM

## 2019-04-15 DIAGNOSIS — Z1231 Encounter for screening mammogram for malignant neoplasm of breast: Secondary | ICD-10-CM

## 2019-04-15 MED ORDER — METFORMIN HCL 1000 MG PO TABS: 1000.0000 mg | ORAL_TABLET | Freq: Two times a day (BID) | ORAL | 1 refills | Status: DC

## 2019-04-15 MED FILL — metFORMIN HCL 1000 MG TABS: 1000 | 90 days supply | Qty: 180 | Fill #0

## 2019-04-16 LAB — CYTOLOGY - PAP
Adequacy: ABSENT
Chlamydia: NEGATIVE
Diagnosis: NEGATIVE
HPV: NOT DETECTED
Neisseria Gonorrhea: NEGATIVE

## 2019-04-21 ENCOUNTER — Other Ambulatory Visit: Payer: Self-pay

## 2019-04-21 ENCOUNTER — Ambulatory Visit
Admission: RE | Admit: 2019-04-21 | Discharge: 2019-04-21 | Disposition: A | Discharge status: Home / Self Care | Payer: 59 | Source: Ambulatory Visit | Attending: Family Medicine | Admitting: Family Medicine

## 2019-04-21 DIAGNOSIS — Z1231 Encounter for screening mammogram for malignant neoplasm of breast: Secondary | ICD-10-CM | POA: Diagnosis not present

## 2019-04-25 MED FILL — REPATHA SURECLICK 140 MG/ML: 140 | 28 days supply | Qty: 2 | Fill #2

## 2019-04-25 MED FILL — ASPIRIN CHILD 81 MG TAB CHE: 81 | 90 days supply | Qty: 90 | Fill #0

## 2019-04-28 ENCOUNTER — Other Ambulatory Visit: Payer: Self-pay | Admitting: Interventional Cardiology

## 2019-04-28 MED FILL — SPIRONOLACTONE 25 MG TABLET: 25 | 90 days supply | Qty: 90 | Fill #0

## 2019-04-30 DIAGNOSIS — Z1211 Encounter for screening for malignant neoplasm of colon: Secondary | ICD-10-CM | POA: Diagnosis not present

## 2019-05-01 ENCOUNTER — Other Ambulatory Visit: Payer: Self-pay | Admitting: Interventional Cardiology

## 2019-05-01 MED ORDER — FUROSEMIDE 40 MG PO TABS: 40.0000 mg | ORAL_TABLET | Freq: Two times a day (BID) | ORAL | 0 refills | Status: DC

## 2019-05-01 MED FILL — FUROSEMIDE 40 MG TAB: 40 | 30 days supply | Qty: 60 | Fill #0

## 2019-05-05 ENCOUNTER — Encounter: Payer: Self-pay | Admitting: Physician Assistant

## 2019-05-05 NOTE — Progress Notes (Signed)
Cardiology Office Note    Date:  05/06/2019   ID:  [name], DOB 09-06-1955, MRN [id number]  PCP:  [name], MD  Cardiologist:  Larae Grooms, MD , more recently followed with Dr. Aundra Dubin with CHF clinic Electrophysiologist:  None   Chief Complaint: f/u CHF  History of Present Illness:   Terri Heath ([place]) is a 63 y.o. 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  She works as an Development worker, international aid at [employer]. She first developed exertional dyspnea after her appendectomy in 2/19. She also recalls having had a day where she'd had really strange shoulder blade pain. By 5/19, she was gaining weight and had developed a chronic cough. She never had chest pain. Echo was done in 5/19 showing EF 25-30%, moderate MR, moderate RV dilation. RHC/LHC was done in 6/19 showing elevated filling pressures, preserved cardiac output, and occluded mid LCx and distal RCA. She was found to have IgM monoclonal antibody. Skeletal survey showed no lytic lesions, likely MGUS. Cardiac MRI in 8/19 showed EF 32%, subendocardial scar (consistent with prior MI) at inferior base, moderately dilated RV with RV EF 20%. No evidence for cardiac amyloidosis by MRI. Repeat echo in 10/19 showed EF remaining 30-35%. She was seen by Dr. Rayann Heman and she declined ICD. Her cardiomyopathy was suspected ischemic and she saw Dr. Aundra Dubin in [place] [facility], last OV 08/2018 with plan for f/u in 3 months but do not see this occurred. She has not wanted to take an SGLT2 inhibitor due to history of GU infections. Last labs by PCP 03/2019 with TSH wnl, A1C 14, LDL 92, K 4.4, CR 0.82, LFTS ok.  She returns for follow-up overall feeling like she's done well over the last few months. With the pandemic she admits  her diet has been worse, giving example of ice cream, so has gained some weight. She is NYHA class II and stable, reporting she can walk at a brisk pace at [facility] without symptoms but does get some DOE if she pushes herself at higher intensity. This is unchanged. No chest pain, syncope, DOE reported. 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 She has been compliant with meds.   Past Medical History:  Diagnosis Date  . Allergy   . Bilateral shoulder pain   . Borderline hypertension   . Bronchitis   . CAD (coronary artery disease)   . Chronic systolic CHF (congestive heart failure) ([place])   . Diabetes mellitus    Diagnosed in 2000  . Hyperlipidemia   . Hypothyroidism   . MGUS (monoclonal gammopathy of unknown significance)   . Mitral regurgitation   . Obesity   . Sleep apnea   . Statin intolerance     Past Surgical History:  Procedure Laterality Date  . BREATH TEK H PYLORI  09/18/2011   Procedure: BREATH TEK H PYLORI;  Surgeon: Shann Medal, MD;  Location: [facility];  Service: General;  Laterality: N/A;  . CESAREAN SECTION     x 3  . LAPAROSCOPIC APPENDECTOMY N/A 10/03/2017   Procedure: APPENDECTOMY LAPAROSCOPIC;  Surgeon: Leighton Ruff, MD;  Location: [facility];  Service: General;  Laterality: N/A;  . RIGHT/LEFT HEART CATH AND CORONARY ANGIOGRAPHY N/A 02/12/2018   Procedure: RIGHT/LEFT HEART CATH AND CORONARY  ANGIOGRAPHY;  Surgeon: Jettie Booze, MD;  Location: [facility];  Service: Cardiovascular;  Laterality: N/A;    Current Medications: Current Meds  Medication Sig  . albuterol (PROAIR HFA) 108 (90 Base) MCG/ACT inhaler Inhale 2 puffs into the lungs every 6 (six) hours as needed. wheezing  . ASPIRIN ADULT LOW STRENGTH 81 MG EC tablet TAKE 1 TABLET (81 MG TOTAL) BY MOUTH DAILY.  Marland Kitchen bismuth subsalicylate (PEPTO BISMOL) 262 MG chewable tablet Chew 524 mg by mouth as needed for indigestion or diarrhea or loose stools.   .  carvedilol (COREG) 12.5 MG tablet TAKE 1 TABLET BY MOUTH 2 TIMES DAILY.  . cetirizine (ZYRTEC) 10 MG tablet Take 10 mg by mouth daily.  Marland Kitchen ENTRESTO 49-51 MG TAKE 1 TABLET BY MOUTH 2 TIMES DAILY.  Marland Kitchen Evolocumab (REPATHA SURECLICK) [id number] MG/ML SOAJ Inject 1 pen into the skin every 14 (fourteen) days.  . fluticasone (FLONASE) 50 MCG/ACT nasal spray Place 2 sprays into both nostrils daily as needed for allergies or rhinitis.  . furosemide (LASIX) 40 MG tablet Take 1 tablet (40 mg total) by mouth 2 (two) times daily. Please keep upcoming appt in September before anymore refills. Final Attempt  . levothyroxine (SYNTHROID) 50 MCG tablet TAKE 1 TABLET BY MOUTH DAILY BEFORE BREAKFAST.  . magnesium oxide (MAG-OX) 400 MG tablet Take 400 mg by mouth at bedtime as needed (for cramps).  . metFORMIN (GLUCOPHAGE) 1000 MG tablet Take 1 tablet (1,000 mg total) by mouth 2 (two) times daily with a meal.  . Multiple Vitamin (MULITIVITAMIN WITH MINERALS) TABS Take 1 tablet by mouth daily with breakfast.  . spironolactone (ALDACTONE) 25 MG tablet Take 1 tablet (25 mg total) by mouth daily.  . TRULICITY 1.5 0000000 SOPN INJECT 1 SYRINGE INTO THE SKIN ONCE A WEEK AS DIRECTED     Allergies:   Amoxicillin, Adhesive [tape], Exenatide, Invokana [canagliflozin], Lisinopril, Other, and Statins   Social History   Socioeconomic History  . Marital status: Married    Spouse name: Not on file  . Number of children: Not on file  . Years of education: Not on file  . Highest education level: Not on file  Occupational History  . Not on file  Social Needs  . Financial resource strain: Not on file  . Food insecurity    Worry: Not on file    Inability: Not on file  . Transportation needs    Medical: Not on file    Non-medical: Not on file  Tobacco Use  . Smoking status: Former Smoker    Packs/day: 1.00    Years: 15.00    Pack years: 15.00    Types: Cigarettes    Quit date: 01/28/1990    Years since quitting: 29.2  .  Smokeless tobacco: Never Used  Substance and Sexual Activity  . Alcohol use: No  . Drug use: No  . Sexual activity: Not Currently  Lifestyle  . Physical activity    Days per week: Not on file    Minutes per session: Not on file  . Stress: Not on file  Relationships  . Social Herbalist on phone: Not on file    Gets together: Not on file    Attends religious service: Not on file    Active member of club or organization: Not on file    Attends meetings of clubs or organizations: Not on file    Relationship status: Not on file  Other Topics Concern  .  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 There is no history of Breast cancer.  ROS:   Please see the history of present illness.  All other systems are reviewed and otherwise negative.    EKGs/Labs/Other Studies Reviewed:    Studies reviewed were summarized above.   EKG:  EKG is ordered today, personally reviewed, demonstrating NSR 88bpm, low voltage QRS, nonspecific TW changes, Qtc 452ms. No acute change from prior.  Recent Labs: 09/05/2018: Hemoglobin 13.6; Platelet Count 274 04/11/2019: ALT 23; BUN 16; Creatinine, Ser 0.82; Potassium 4.4; Sodium 136; TSH 2.920  Recent Lipid Panel    Component Value Date/Time   CHOL 184 04/11/2019 1111   TRIG 174 (H) 04/11/2019 1111   HDL 57 04/11/2019 1111   CHOLHDL 3.2 04/11/2019 1111   CHOLHDL 4.1 03/14/2016 1228   VLDL 28 03/14/2016 1228   LDLCALC 92 04/11/2019 1111    PHYSICAL EXAM:    VS:  BP 114/76   Pulse 88   Ht 5\' 7"  (1.702 m)   Wt (!) 309 lb 12.8 oz (140.5 kg)   SpO2 94%   BMI 48.52 kg/m   BMI: Body mass index is 48.52 kg/m.  GEN: Well nourished, well developed morbidly obese  WF, in no acute distress HEENT: normocephalic, atraumatic Neck: no JVD, carotid bruits, or masses Cardiac: RRR; no murmurs, rubs, or gallops, trace LLE edema  Respiratory:  clear to auscultation bilaterally, normal work of breathing GI: soft, nontender, nondistended, + BS MS: no deformity or atrophy Skin: warm and dry, no rash Neuro:  Alert and Oriented x 3, Strength and sensation are intact, follows commands Psych: euthymic mood, full affect  Wt Readings from Last 3 Encounters:  05/06/19 (!) 309 lb 12.8 oz (140.5 kg)  04/11/19 (!) 308 lb 3.2 oz (139.8 kg)  03/13/19 (!) 304 lb 3.2 oz (138 kg)     ASSESSMENT & PLAN:   1. Chronic systolic CHF - she reports clinical stability over the last several months. She has trace left lower extremity edema which she states is chronic and unchanged for her. Lungs are clear. Will continue current regimen. Given her mitral regurgitation, will repeat her echocardiogram at the 1 year mark next month to reassess LVEF. I revisited the idea of ICD today but she is not ready to commit to a decision. She will let us know if she changes her mind. Reviewed 2g sodium restriction, 2L fluid restriction, daily weights with patient. 2. CAD - no recent progressive anginal symptoms. Continue ASA, BB, PCSK9 inhibitor. 3. Hyperlipidemia - LDL was 92 by recent labs, higher than we'd like given her CAD. She has been compliant with PCSK9 but does admit she has a lot of room to work on her diet. She cannot take statins or Zetia. The only other clinical option would be Nexletol. However, she would like to work on her lifestyle and repeat labs in 3 months.  4. Mitral regurgitation - will reassess by echocardiogram above. 5. Morbid obesity - given her comorbidities, remains of great concern. She has struggled with accountability in the past, and the pandemic has also taken a toll on her diet. We discussed referral to [facility].  She would be an excellent  candidate especially given her diabetes as well. She is eager to proceed. Will place referral. We also discussed something like a compact chair elliptical to use at her desk.  Disposition: F/u with Dr. Aundra Dubin in January 2021 per patient request.  Medication Adjustments/Labs and Tests Ordered: Current medicines are reviewed at length with the patient today.  Concerns regarding medicines are outlined above. Medication changes, Labs and Tests ordered today are summarized above and listed in the Patient Instructions accessible in Encounters.   Signed, Charlie Pitter, PA-C  05/06/2019 11:45 AM    [address], [place], [place]  [address] Phone: [phone]; Fax: [phone]

## 2019-05-06 ENCOUNTER — Encounter: Payer: Self-pay | Admitting: Physician Assistant

## 2019-05-06 ENCOUNTER — Ambulatory Visit: Payer: 59 | Admitting: Physician Assistant

## 2019-05-06 ENCOUNTER — Other Ambulatory Visit: Payer: Self-pay

## 2019-05-06 VITALS — BP 114/76 | HR 88 | Ht 67.0 in | Wt 309.8 lb

## 2019-05-06 DIAGNOSIS — I5022 Chronic systolic (congestive) heart failure: Secondary | ICD-10-CM

## 2019-05-06 DIAGNOSIS — I2510 Atherosclerotic heart disease of native coronary artery without angina pectoris: Secondary | ICD-10-CM

## 2019-05-06 DIAGNOSIS — I340 Nonrheumatic mitral (valve) insufficiency: Secondary | ICD-10-CM | POA: Diagnosis not present

## 2019-05-06 DIAGNOSIS — E785 Hyperlipidemia, unspecified: Secondary | ICD-10-CM

## 2019-05-06 NOTE — Patient Instructions (Addendum)
Medication Instructions:  Your physician recommends that you continue on your current medications as directed. Please refer to the Current Medication list given to you today.  If you need a refill on your cardiac medications before your next appointment, please call your pharmacy.   Lab work: 3 MONTHS:  CMET & FASTING LIPID  If you have labs (blood work) drawn today and your tests are completely normal, you will receive your results only by: Marland Kitchen MyChart Message (if you have MyChart) OR . A paper copy in the mail If you have any lab test that is abnormal or we need to change your treatment, we will call you to review the results.  Testing/Procedures: .Your physician has requested that you have an echocardiogram IN OCTOBER. Echocardiography is a painless test that uses sound waves to create images of your heart. It provides your doctor with information about the size and shape of your heart and how well your heart's chambers and valves are working. This procedure takes approximately one hour. There are no restrictions for this procedure.   You have been referred to [facility].  THEY WILL CALL YO TO SET UP YOUR APPOINTMENT   Follow-Up: At [facility], you and your health needs are our priority.  As part of our continuing mission to provide you with exceptional heart care, we have created designated Provider Care Teams.  These Care Teams include your primary Cardiologist (physician) and Advanced Practice Providers (APPs -  Physician Assistants and Nurse Practitioners) who all work together to provide you with the care you need, when you need it. . You will need a follow up appointment in 08/2019 WITH DR. Aundra Dubin  Any Other Special Instructions Will Be Listed Below (If Applicable).

## 2019-05-07 LAB — COLOGUARD: Cologuard: NEGATIVE

## 2019-05-12 ENCOUNTER — Other Ambulatory Visit (HOSPITAL_COMMUNITY): Payer: Self-pay | Admitting: Cardiology

## 2019-05-13 MED FILL — CARVEDILOL 12.5 MG TABLET: 12.5 | 90 days supply | Qty: 180 | Fill #0

## 2019-05-14 ENCOUNTER — Other Ambulatory Visit: Payer: Self-pay | Admitting: Internal Medicine

## 2019-05-15 MED FILL — TRULICITY 1.5 MG/0.5 ML PEN: 1.5 | 28 days supply | Qty: 2 | Fill #0

## 2019-05-19 MED FILL — ENTRESTO 49 MG-51 MG TABLET: 49-51 | 30 days supply | Qty: 60 | Fill #1

## 2019-05-19 MED FILL — REPATHA SURECLICK 140 MG/ML: 140 | 28 days supply | Qty: 2 | Fill #3

## 2019-05-29 ENCOUNTER — Other Ambulatory Visit: Payer: Self-pay | Admitting: Interventional Cardiology

## 2019-05-30 MED FILL — FUROSEMIDE 40 MG TAB: 40 | 90 days supply | Qty: 180 | Fill #0

## 2019-06-05 MED FILL — NITROFURANTOIN MONO-MCR 100: 100 | 5 days supply | Qty: 10 | Fill #0

## 2019-06-11 ENCOUNTER — Ambulatory Visit (HOSPITAL_COMMUNITY): Payer: 59 | Attending: Internal Medicine

## 2019-06-11 ENCOUNTER — Other Ambulatory Visit: Payer: Self-pay

## 2019-06-11 DIAGNOSIS — I5022 Chronic systolic (congestive) heart failure: Secondary | ICD-10-CM | POA: Diagnosis not present

## 2019-06-11 DIAGNOSIS — I2510 Atherosclerotic heart disease of native coronary artery without angina pectoris: Secondary | ICD-10-CM

## 2019-06-11 DIAGNOSIS — I340 Nonrheumatic mitral (valve) insufficiency: Secondary | ICD-10-CM | POA: Diagnosis not present

## 2019-06-11 DIAGNOSIS — E785 Hyperlipidemia, unspecified: Secondary | ICD-10-CM

## 2019-06-11 MED ORDER — PERFLUTREN LIPID MICROSPHERE
1.0000 mL | INTRAVENOUS | Status: AC | PRN
  Administered 2019-06-11: 2 mL via INTRAVENOUS

## 2019-06-12 ENCOUNTER — Telehealth: Payer: Self-pay | Admitting: *Deleted

## 2019-06-12 NOTE — Telephone Encounter (Signed)
-----   Message from Charlie Pitter, [place] sent at 06/12/2019 10:19 AM EDT ----- Please let Terri Heath know I have some good news. Her EF has improved from prior values. It is still not normal, but she has had improvement from the last echo.  The last assessment in 05/2018 showed an EF 30-35%.  This study shows 40-45%. Therefore, she has graduated to the EF that no longer requires consideration for defibrillator.  There is mild leaking of the mitral valve, mild enlargement of the left atrium, mildly elevated pressures in the lungs. Nothing needed to do for this at this time from cardiac standpoint, but long term weight management will be extremely important.  F/u as previously discussed Melina Copa PA-C

## 2019-06-12 NOTE — Telephone Encounter (Signed)
Call placed to pt re: echo results, left a message for pt to call back.

## 2019-06-13 NOTE — Telephone Encounter (Signed)
2nd call to pt re: echo results.  Pt has been made aware. See result note.

## 2019-06-20 ENCOUNTER — Other Ambulatory Visit (HOSPITAL_COMMUNITY): Payer: Self-pay | Admitting: Cardiology

## 2019-06-20 ENCOUNTER — Other Ambulatory Visit: Payer: Self-pay | Admitting: Internal Medicine

## 2019-06-20 MED FILL — ALBUTEROL SULFATE HFA 108 (: 108 (90 BAS | 25 days supply | Qty: 9 | Fill #2

## 2019-06-20 MED FILL — TRULICITY 1.5 MG/0.5 ML PEN: 1.5 | 28 days supply | Qty: 2 | Fill #0

## 2019-06-20 MED FILL — REPATHA SURECLICK 140 MG/ML: 140 | 28 days supply | Qty: 2 | Fill #4

## 2019-06-23 MED FILL — ENTRESTO 49 MG-51 MG TABLET: 49-51 | 30 days supply | Qty: 60 | Fill #0

## 2019-07-21 ENCOUNTER — Other Ambulatory Visit: Payer: Self-pay | Admitting: Internal Medicine

## 2019-07-21 MED FILL — ENTRESTO 49 MG-51 MG TABLET: 49-51 | 30 days supply | Qty: 60 | Fill #1

## 2019-07-21 MED FILL — LEVOTHYROXINE 50 MCG TABLET: 50 | 90 days supply | Qty: 90 | Fill #2

## 2019-07-21 MED FILL — metFORMIN HCL 1000 MG TABS: 1000 | 90 days supply | Qty: 180 | Fill #1

## 2019-07-21 MED FILL — ASPIRIN CHILD 81 MG TAB CHE: 81 | 90 days supply | Qty: 90 | Fill #0

## 2019-07-25 ENCOUNTER — Other Ambulatory Visit: Payer: Self-pay | Admitting: Internal Medicine

## 2019-07-30 ENCOUNTER — Other Ambulatory Visit: Payer: Self-pay | Admitting: Internal Medicine

## 2019-07-31 DIAGNOSIS — G4733 Obstructive sleep apnea (adult) (pediatric): Secondary | ICD-10-CM | POA: Diagnosis not present

## 2019-08-01 ENCOUNTER — Ambulatory Visit: Payer: 59 | Admitting: Family Medicine

## 2019-08-05 ENCOUNTER — Other Ambulatory Visit: Payer: 59

## 2019-08-05 ENCOUNTER — Other Ambulatory Visit (HOSPITAL_COMMUNITY): Payer: Self-pay

## 2019-08-05 MED ORDER — SPIRONOLACTONE 25 MG PO TABS: 25.0000 mg | ORAL_TABLET | Freq: Every day | ORAL | 3 refills | Status: DC

## 2019-08-05 MED FILL — SPIRONOLACTONE 25 MG TABS: 25 | 90 days supply | Qty: 90 | Fill #0

## 2019-08-12 MED FILL — CARVEDILOL 12.5 MG TABLET: 12.5 | 90 days supply | Qty: 180 | Fill #1

## 2019-08-13 ENCOUNTER — Telehealth: Payer: Self-pay | Admitting: Hematology and Oncology

## 2019-08-13 NOTE — Telephone Encounter (Signed)
[place] 1/20 moved f/u from AM to PM. Not able to reach patient by phone. Schedule mailed.

## 2019-08-25 ENCOUNTER — Other Ambulatory Visit (HOSPITAL_COMMUNITY): Payer: Self-pay | Admitting: Cardiology

## 2019-08-25 MED FILL — FUROSEMIDE 40 MG TAB: 40 | 90 days supply | Qty: 180 | Fill #1

## 2019-08-25 MED FILL — ENTRESTO 49 MG-51 MG TABLET: 49-51 | 30 days supply | Qty: 60 | Fill #0

## 2019-09-01 ENCOUNTER — Ambulatory Visit: Payer: 59 | Admitting: Family Medicine

## 2019-09-03 ENCOUNTER — Ambulatory Visit: Payer: 59 | Admitting: Family Medicine

## 2019-09-04 ENCOUNTER — Other Ambulatory Visit: Payer: Self-pay

## 2019-09-04 ENCOUNTER — Encounter (HOSPITAL_COMMUNITY): Payer: Self-pay | Admitting: Cardiology

## 2019-09-04 ENCOUNTER — Ambulatory Visit (HOSPITAL_COMMUNITY)
Admission: RE | Admit: 2019-09-04 | Discharge: 2019-09-04 | Disposition: A | Discharge status: Home / Self Care | Payer: 59 | Source: Ambulatory Visit | Attending: Cardiology | Admitting: Cardiology

## 2019-09-04 VITALS — BP 117/76 | HR 92 | Wt 302.4 lb

## 2019-09-04 DIAGNOSIS — G4733 Obstructive sleep apnea (adult) (pediatric): Secondary | ICD-10-CM | POA: Insufficient documentation

## 2019-09-04 DIAGNOSIS — I5022 Chronic systolic (congestive) heart failure: Secondary | ICD-10-CM | POA: Insufficient documentation

## 2019-09-04 DIAGNOSIS — Z7982 Long term (current) use of aspirin: Secondary | ICD-10-CM | POA: Diagnosis not present

## 2019-09-04 DIAGNOSIS — E119 Type 2 diabetes mellitus without complications: Secondary | ICD-10-CM | POA: Diagnosis not present

## 2019-09-04 DIAGNOSIS — E039 Hypothyroidism, unspecified: Secondary | ICD-10-CM | POA: Insufficient documentation

## 2019-09-04 DIAGNOSIS — Z8249 Family history of ischemic heart disease and other diseases of the circulatory system: Secondary | ICD-10-CM | POA: Diagnosis not present

## 2019-09-04 DIAGNOSIS — D472 Monoclonal gammopathy: Secondary | ICD-10-CM | POA: Diagnosis not present

## 2019-09-04 DIAGNOSIS — I252 Old myocardial infarction: Secondary | ICD-10-CM | POA: Insufficient documentation

## 2019-09-04 DIAGNOSIS — Z87891 Personal history of nicotine dependence: Secondary | ICD-10-CM | POA: Diagnosis not present

## 2019-09-04 DIAGNOSIS — Z79899 Other long term (current) drug therapy: Secondary | ICD-10-CM | POA: Insufficient documentation

## 2019-09-04 DIAGNOSIS — E785 Hyperlipidemia, unspecified: Secondary | ICD-10-CM | POA: Insufficient documentation

## 2019-09-04 DIAGNOSIS — I2510 Atherosclerotic heart disease of native coronary artery without angina pectoris: Secondary | ICD-10-CM | POA: Insufficient documentation

## 2019-09-04 DIAGNOSIS — Z79890 Hormone replacement therapy (postmenopausal): Secondary | ICD-10-CM | POA: Diagnosis not present

## 2019-09-04 DIAGNOSIS — Z7984 Long term (current) use of oral hypoglycemic drugs: Secondary | ICD-10-CM | POA: Diagnosis not present

## 2019-09-04 LAB — BASIC METABOLIC PANEL
Anion gap: 13 (ref 5–15)
BUN: 15 mg/dL (ref 8–23)
CO2: 23 mmol/L (ref 22–32)
Calcium: 8.9 mg/dL (ref 8.9–10.3)
Chloride: 95 mmol/L — ABNORMAL LOW (ref 98–111)
Creatinine, Ser: 0.77 mg/dL (ref 0.44–1.00)
GFR calc Af Amer: >60 mL/min (ref >60)
GFR calc non Af Amer: >60 mL/min (ref >60)
Glucose, Bld: 493 mg/dL — ABNORMAL HIGH (ref 70–99)
Potassium: 4 mmol/L (ref 3.5–5.1)
Sodium: 131 mmol/L — ABNORMAL LOW (ref 135–145)

## 2019-09-04 MED ORDER — FUROSEMIDE 40 MG PO TABS: ORAL_TABLET | ORAL | 5 refills | Status: DC

## 2019-09-04 MED ORDER — SACUBITRIL-VALSARTAN 97-103 MG PO TABS: 1.0000 | ORAL_TABLET | Freq: Two times a day (BID) | ORAL | 5 refills | Status: DC

## 2019-09-04 MED FILL — ENTRESTO 97 MG-103 MG TAB: 97-103 | 30 days supply | Qty: 60 | Fill #0

## 2019-09-04 NOTE — Progress Notes (Signed)
PCP: [name], MD  Cardiology: Dr. Irish Lack HF Cardiology: Dr. Aundra Dubin  64 y.o. with history of chronic systolic CHF, CAD, and MGUS was referred by Dr. Irish Lack for evaluation of CHF.  Patient first developed exertional dyspnea after her appendectomy in 2/19.  By 5/19, she was gaining weight and had developed a chronic cough. She never had chest pain.  Echo was done in 5/19 showing EF 25-30%, moderate MR, moderate RV dilation.  RHC/LHC was done in 6/19 showing elevated filling pressures, preserved cardiac output, and occluded mid LCx and distal RCA.  She was found to have IgM monoclonal antibody.  Skeletal survey showed no lytic lesions, likely MGUS.  Cardiac MRI in 8/19 showed EF 32%, subendocardial scar (consistent with prior MI) at inferior base, moderately dilated RV with RV EF 20%. No evidence for cardiac amyloidosis by MRI.  Repeat echo in 10/19 showed EF remaining 30-35%.  She was seen by Dr. Rayann Heman and declined ICD.   Echo in 10/20 showed EF 40-45%.   She returns for followup of CHF.  She has generalized fatigue. She is able to walk around [employer] with mild exertional dyspnea.  No chest pain or lightheadedness.  No orthopnea/PND.  She has been out of Repatha for at least a month.     Labs (5/19): transferrin saturation 20%, immunofixation with IgM monoclonal antibody.  Labs (6/19): K 4.5, creatinine 0.79 Labs (7/19): K 4.1, creatinine 0.86, TSH normal Labs (9/19): K 4.4, creatinine 0.61 Labs (10/19): LDL 187 Labs (12/19): LDL 82 Labs (1/20): K 4, creatinine 0.81 Labs (8/20): K 4.4, creatinine 0.82, LDL 92  PMH: 1. Type II diabetes 2. OSA 3. H/o appendectomy 4. 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  Skeletal survey in 7/19 showed no lytic lesions.  6. 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   - Echo (5/19): EF 25-30%, mild LV dilation, moderate MR, moderately dilated RV.  - LHC/RHC (6/19): distal RCA totally  occluded, mid LCx totally occluded, EF < 25%. No intervention. Mean RA 10, mean PA 42, mean PCWP 29, CI 2.3.  - Cardiac MRI in 8/19 showed EF 32%, subendocardial scar (consistent with prior MI) at inferior base, moderately dilated RV with RV EF 20%. No evidence for cardiac amyloidosis by MRI.  - Echo (10/19): EF 30-35%.   - Echo (10/20): EF 40-45%, mild LVH, mild MR 7. CAD: LHC (6/19) with distal RCA totally occluded, mid LCx totally occluded, EF < 25%. No intervention. 8. Hyperlipidemia: Has not tolerated statins.  9. ABIs (8/19): Normal.   SH: Married, works as Nurse, adult, quit smoking in 1991.    FH: Mother with rheumatic MV disease, s/p MVR.   ROS: All systems reviewed and negative except as per HPI.   Current Outpatient Medications  Medication Sig Dispense Refill  . albuterol (PROAIR HFA) 108 (90 Base) MCG/ACT inhaler Inhale 2 puffs into the lungs every 6 (six) hours as needed. wheezing 18 g 2  . aspirin 81 MG chewable tablet TAKE 1 TABLET BY MOUTH DAILY. 90 tablet 0  . bismuth subsalicylate (PEPTO BISMOL) 262 MG chewable tablet Chew 524 mg by mouth as needed for indigestion or diarrhea or loose stools.     . carvedilol (COREG) 12.5 MG tablet TAKE 1 TABLET BY MOUTH 2 TIMES DAILY. 180 tablet 3  . cetirizine (ZYRTEC) 10 MG tablet Take 10 mg by mouth as needed.     . fluticasone (FLONASE) 50 MCG/ACT nasal spray Place 2 sprays into both  nostrils daily as needed for allergies or rhinitis.    . furosemide (LASIX) 40 MG tablet Take 1 tablet (40 mg total) by mouth every morning AND 0.5 tablets (20 mg total) every evening. 45 tablet 5  . levothyroxine (SYNTHROID) 50 MCG tablet TAKE 1 TABLET BY MOUTH DAILY BEFORE BREAKFAST. 90 tablet 3  . magnesium oxide (MAG-OX) 400 MG tablet Take 400 mg by mouth at bedtime as needed (for cramps).    . metFORMIN (GLUCOPHAGE) 1000 MG tablet Take 1 tablet (1,000 mg total) by mouth 2 (two) times daily with a meal. 180 tablet 1  . Multiple Vitamin  (MULITIVITAMIN WITH MINERALS) TABS Take 1 tablet by mouth daily with breakfast.    . spironolactone (ALDACTONE) 25 MG tablet Take 1 tablet (25 mg total) by mouth daily. 90 tablet 3  . sacubitril-valsartan (ENTRESTO) 97-103 MG Take 1 tablet by mouth 2 (two) times daily. 60 tablet 5   No current facility-administered medications for this encounter.   BP 117/76   Pulse 92   Wt (!) 137.2 kg (302 lb 6.4 oz)   SpO2 97%   BMI 47.36 kg/m  General: NAD Neck: No JVD, no thyromegaly or thyroid nodule.  Lungs: Clear to auscultation bilaterally with normal respiratory effort. CV: Nondisplaced PMI.  Heart regular S1/S2, no S3/S4, no murmur.  No peripheral edema.  No carotid bruit.  Normal pedal pulses.  Abdomen: Soft, nontender, no hepatosplenomegaly, no distention.  Skin: Intact without lesions or rashes.  Neurologic: Alert and oriented x 3.  Psych: Normal affect. Extremities: No clubbing or cyanosis.  HEENT: Normal.   Assessment/Plan: 1. Chronic systolic CHF: Echo in 0000000 with EF 25-30%.  She had occluded mid LCx and distal RCA on coronary angiography.  Suspect ischemic cardiomyopathy.  She also has a monoclonal antibody.  Cardiac MRI in 8/19 showed LV EF 32% and RV EF 30%, subendocardial scar at inferior base consistent with prior MI, no evidence for cardiac amyloidosis.  Repeat echo in 10/19 showed EF 30-35%.  Patient saw Dr. Rayann Heman and declined ICD.  However, echo in 10/20 showed EF up to 40-45% and out of ICD range.  NYHA class II symptoms, she is not volume overloaded.  - Increase Entresto to 97/103 bid. BMET in 10 days.  - Continue spironolactone 25 mg daily.   - Continue Coreg 12.5 mg bid.   - She has not wanted to take an SGLT2 inhibitor due to history of GU infections.    - Decrease Lasix to 40 qam/20 qpm with increase in Entresto.  2. CAD: Occluded mLCx and dRCA.  Low EF somewhat out of proportion to this.  No interventional target.   - Continue ASA 81 daily.  - Unable to tolerate  statins, has been on Repatha but out of it for a month.  I will message lipid clinic about getting her back on Repatha.  She will need lipid check in 3 months.  She may need addition of Zetia if LDL stays above 70 on Repatha.    Followup in 4 months.    Loralie Champagne 09/04/2019

## 2019-09-04 NOTE — Patient Instructions (Addendum)
INCREASE Entresto 97/103mg  ( 1 tab) twice  A day   DECEASE Lasix to 40mg  (1 tab) in the morning and 20mg  (1/2 tab) in the evening  Labs today and repeat in 3 months We will only contact you if something comes back abnormal or we need to make some changes. Otherwise no news is good news!   Restart Repatha, please contact lipid clinic to schedule an appointment as soon as possible.   Your physician recommends that you schedule a follow-up appointment in: 4 months with Dr Aundra Dubin   Please call office at [phone] option 2 if you have any questions or concerns.     At the [facility], you and your health needs are our priority. As part of our continuing mission to provide you with exceptional heart care, we have created designated Provider Care Teams. These Care Teams include your primary Cardiologist (physician) and Advanced Practice Providers (APPs- Physician Assistants and Nurse Practitioners) who all work together to provide you with the care you need, when you need it.   You may see any of the following providers on your designated Care Team at your next follow up: Marland Kitchen Dr Glori Bickers . Dr Loralie Champagne . Darrick Grinder, NP . Lyda Jester, PA . Audry Riles, PharmD   Please be sure to bring in all your medications bottles to every appointment.

## 2019-09-10 ENCOUNTER — Inpatient Hospital Stay: Payer: 59 | Attending: Hematology and Oncology

## 2019-09-10 ENCOUNTER — Other Ambulatory Visit: Payer: Self-pay

## 2019-09-10 DIAGNOSIS — D472 Monoclonal gammopathy: Secondary | ICD-10-CM | POA: Insufficient documentation

## 2019-09-10 DIAGNOSIS — Z7982 Long term (current) use of aspirin: Secondary | ICD-10-CM | POA: Diagnosis not present

## 2019-09-10 DIAGNOSIS — Z7984 Long term (current) use of oral hypoglycemic drugs: Secondary | ICD-10-CM | POA: Insufficient documentation

## 2019-09-10 DIAGNOSIS — Z79899 Other long term (current) drug therapy: Secondary | ICD-10-CM | POA: Insufficient documentation

## 2019-09-10 LAB — CMP (CANCER CENTER ONLY)
ALT: 18 U/L (ref 0–44)
AST: 12 U/L — ABNORMAL LOW (ref 15–41)
Albumin: 3.4 g/dL — ABNORMAL LOW (ref 3.5–5.0)
Alkaline Phosphatase: 130 U/L — ABNORMAL HIGH (ref 38–126)
Anion gap: 14 (ref 5–15)
BUN: 20 mg/dL (ref 8–23)
CO2: 23 mmol/L (ref 22–32)
Calcium: 9.1 mg/dL (ref 8.9–10.3)
Chloride: 97 mmol/L — ABNORMAL LOW (ref 98–111)
Creatinine: 0.99 mg/dL (ref 0.44–1.00)
GFR, Est AFR Am: >60 mL/min (ref >60)
GFR, Estimated: >60 mL/min (ref >60)
Glucose, Bld: 397 mg/dL — ABNORMAL HIGH (ref 70–99)
Potassium: 4.5 mmol/L (ref 3.5–5.1)
Sodium: 134 mmol/L — ABNORMAL LOW (ref 135–145)
Total Bilirubin: 0.3 mg/dL (ref 0.3–1.2)
Total Protein: 7.7 g/dL (ref 6.5–8.1)

## 2019-09-10 LAB — CBC WITH DIFFERENTIAL (CANCER CENTER ONLY)
Abs Immature Granulocytes: 0.03 10*3/uL (ref 0.00–0.07)
Basophils Absolute: 0.1 10*3/uL (ref 0.0–0.1)
Basophils Relative: 1 %
Eosinophils Absolute: 0.3 10*3/uL (ref 0.0–0.5)
Eosinophils Relative: 4 %
HCT: 41.2 % (ref 36.0–46.0)
Hemoglobin: 13.9 g/dL (ref 12.0–15.0)
Immature Granulocytes: 0 %
Lymphocytes Relative: 43 %
Lymphs Abs: 3.2 10*3/uL (ref 0.7–4.0)
MCH: 28.8 pg (ref 26.0–34.0)
MCHC: 33.7 g/dL (ref 30.0–36.0)
MCV: 85.3 fL (ref 80.0–100.0)
Monocytes Absolute: 0.5 10*3/uL (ref 0.1–1.0)
Monocytes Relative: 7 %
Neutro Abs: 3.4 10*3/uL (ref 1.7–7.7)
Neutrophils Relative %: 45 %
Platelet Count: 278 10*3/uL (ref 150–400)
RBC: 4.83 MIL/uL (ref 3.87–5.11)
RDW: 13.1 % (ref 11.5–15.5)
WBC Count: 7.4 10*3/uL (ref 4.0–10.5)
nRBC: 0 % (ref 0.0–0.2)

## 2019-09-11 LAB — KAPPA/LAMBDA LIGHT CHAINS
Kappa free light chain: 34.8 mg/L — ABNORMAL HIGH (ref 3.3–19.4)
Kappa, lambda light chain ratio: 2.04 — ABNORMAL HIGH (ref 0.26–1.65)
Lambda free light chains: 17.1 mg/L (ref 5.7–26.3)

## 2019-09-15 LAB — MULTIPLE MYELOMA PANEL, SERUM
Albumin SerPl Elph-Mcnc: 3.2 g/dL (ref 2.9–4.4)
Albumin/Glob SerPl: 0.9 (ref 0.7–1.7)
Alpha 1: 0.2 g/dL (ref 0.0–0.4)
Alpha2 Glob SerPl Elph-Mcnc: 0.7 g/dL (ref 0.4–1.0)
B-Globulin SerPl Elph-Mcnc: 1.2 g/dL (ref 0.7–1.3)
Gamma Glob SerPl Elph-Mcnc: 1.6 g/dL (ref 0.4–1.8)
Globulin, Total: 3.7 g/dL (ref 2.2–3.9)
IgA: 374 mg/dL — ABNORMAL HIGH (ref 87–352)
IgG (Immunoglobin G), Serum: 1142 mg/dL (ref 586–1602)
IgM (Immunoglobulin M), Srm: 694 mg/dL — ABNORMAL HIGH (ref 26–217)
M Protein SerPl Elph-Mcnc: 0.4 g/dL — ABNORMAL HIGH
Total Protein ELP: 6.9 g/dL (ref 6.0–8.5)

## 2019-09-16 NOTE — Progress Notes (Signed)
Patient Care Team: [name], MD as PCP - General (Internal Medicine) Jettie Booze, MD as PCP - Cardiology (Cardiology) Rigoberto Noel, MD as Consulting Physician (Pulmonary Disease) Alphonsa Overall, MD (General Surgery)  DIAGNOSIS:    ICD-10-CM   1. MGUS (monoclonal gammopathy of unknown significance)  D47.2     CHIEF COMPLIANT: Follow-up of MGUS  INTERVAL HISTORY: Terri Heath is a 64 y.o. with above-mentioned history of MGUS. Labs on 09/10/19 showed kappa light chain 34.8, ratio 2.04, IgG 1,142, IgA 374, IgM 694, m protein 0.4. She presents to the clinic today for follow-up.  Her biggest issues related to blood sugars.  She is been aggressively trying to Heath weight.  She lost about 40 pounds.  She has not noticed any signs or symptoms of myeloma.  ALLERGIES:  is allergic to amoxicillin; adhesive [tape]; exenatide; invokana [canagliflozin]; lisinopril; other; and statins.  MEDICATIONS:  Current Outpatient Medications  Medication Sig Dispense Refill  . albuterol (PROAIR HFA) 108 (90 Base) MCG/ACT inhaler Inhale 2 puffs into the lungs every 6 (six) hours as needed. wheezing 18 g 2  . aspirin 81 MG chewable tablet TAKE 1 TABLET BY MOUTH DAILY. 90 tablet 0  . bismuth subsalicylate (PEPTO BISMOL) 262 MG chewable tablet Chew 524 mg by mouth as needed for indigestion or diarrhea or loose stools.     . carvedilol (COREG) 12.5 MG tablet TAKE 1 TABLET BY MOUTH 2 TIMES DAILY. 180 tablet 3  . cetirizine (ZYRTEC) 10 MG tablet Take 10 mg by mouth as needed.     . fluticasone (FLONASE) 50 MCG/ACT nasal spray Place 2 sprays into both nostrils daily as needed for allergies or rhinitis.    . furosemide (LASIX) 40 MG tablet Take 1 tablet (40 mg total) by mouth every morning AND 0.5 tablets (20 mg total) every evening. 45 tablet 5  . levothyroxine (SYNTHROID) 50 MCG tablet TAKE 1 TABLET BY MOUTH DAILY BEFORE BREAKFAST. 90 tablet 3  . magnesium oxide (MAG-OX) 400 MG tablet Take 400 mg  by mouth at bedtime as needed (for cramps).    . metFORMIN (GLUCOPHAGE) 1000 MG tablet Take 1 tablet (1,000 mg total) by mouth 2 (two) times daily with a meal. 180 tablet 1  . Multiple Vitamin (MULITIVITAMIN WITH MINERALS) TABS Take 1 tablet by mouth daily with breakfast.    . sacubitril-valsartan (ENTRESTO) 97-103 MG Take 1 tablet by mouth 2 (two) times daily. 60 tablet 5  . spironolactone (ALDACTONE) 25 MG tablet Take 1 tablet (25 mg total) by mouth daily. 90 tablet 3   No current facility-administered medications for this visit.    PHYSICAL EXAMINATION: ECOG PERFORMANCE STATUS: 1 - Symptomatic but completely ambulatory  There were no vitals filed for this visit. There were no vitals filed for this visit.  LABORATORY DATA:  I have reviewed the data as listed CMP Latest Ref Rng & Units 09/10/2019 09/04/2019 04/11/2019  Glucose 70 - 99 mg/dL 397(H) 493(H) 302(H)  BUN 8 - 23 mg/dL 20 15 16   Creatinine 0.44 - 1.00 mg/dL 0.99 0.77 0.82  Sodium 135 - 145 mmol/L 134(L) 131(L) 136  Potassium 3.5 - 5.1 mmol/L 4.5 4.0 4.4  Chloride 98 - 111 mmol/L 97(L) 95(L) 96  CO2 22 - 32 mmol/L 23 23 25   Calcium 8.9 - 10.3 mg/dL 9.1 8.9 9.5  Total Protein 6.5 - 8.1 g/dL 7.7 - 7.6  Total Bilirubin 0.3 - 1.2 mg/dL 0.3 - 0.4  Alkaline Phos 38 - 126 U/L  130(H) - 91  AST 15 - 41 U/L 12(L) - 18  ALT 0 - 44 U/L 18 - 23    Lab Results  Component Value Date   WBC 7.4 09/10/2019   HGB 13.9 09/10/2019   HCT 41.2 09/10/2019   MCV 85.3 09/10/2019   PLT 278 09/10/2019   NEUTROABS 3.4 09/10/2019    ASSESSMENT & PLAN:  MGUS (monoclonal gammopathy of unknown significance) Lab review 09/10/2019: M protein 0.4 g, IgM kappa light chain, kappa 34.8, lambda 17.1, ratio 2.04, hemoglobin 13.9, creatinine 0.99 09/05/2018: M protein 0.5 g IgM kappa light chain restricted, IgG 1416, kappa 40.1, KL ratio 2, Hemoglobin 13.6, creatinine 0.81 01/24/2018 M protein0.5 g IgM kappa light chain restricted, IgG 1723, IgA 540, IgM  706, Hemoglobin 15 g Bone survey 03/14/2018: Negative  I discussed the results of the blood work and provided her with a copy of these results. There is no evidence of progression of MGUS to myeloma. Return to clinic in 1 year with labs done ahead of time.    No orders of the defined types were placed in this encounter.  The patient has a good understanding of the overall plan. she agrees with it. she will call with any problems that may develop before the next visit here.  Total time spent: 15 mins including face to face time and time spent for planning, charting and coordination of care  Terri Lose, MD 09/17/2019  I, Cloyde Reams Dorshimer, am acting as scribe for Dr. Nicholas Heath.  I have reviewed the above documentation for accuracy and completeness, and I agree with the above.

## 2019-09-17 ENCOUNTER — Other Ambulatory Visit: Payer: Self-pay

## 2019-09-17 ENCOUNTER — Inpatient Hospital Stay (HOSPITAL_BASED_OUTPATIENT_CLINIC_OR_DEPARTMENT_OTHER): Payer: 59 | Admitting: Hematology and Oncology

## 2019-09-17 DIAGNOSIS — Z7982 Long term (current) use of aspirin: Secondary | ICD-10-CM | POA: Diagnosis not present

## 2019-09-17 DIAGNOSIS — D472 Monoclonal gammopathy: Secondary | ICD-10-CM | POA: Diagnosis not present

## 2019-09-17 DIAGNOSIS — Z79899 Other long term (current) drug therapy: Secondary | ICD-10-CM | POA: Diagnosis not present

## 2019-09-17 DIAGNOSIS — Z7984 Long term (current) use of oral hypoglycemic drugs: Secondary | ICD-10-CM | POA: Diagnosis not present

## 2019-09-17 NOTE — Assessment & Plan Note (Signed)
Lab review 09/10/2019: M protein 0.4 g, IgM kappa light chain, kappa 34.8, lambda 17.1, ratio 2.04, hemoglobin 13.9, creatinine 0.99 09/05/2018: M protein 0.5 g IgM kappa light chain restricted, IgG 1416, kappa 40.1, KL ratio 2, Hemoglobin 13.6, creatinine 0.81 01/24/2018 M protein0.5 g IgM kappa light chain restricted, IgG 1723, IgA 540, IgM 706, Hemoglobin 15 g Bone survey 03/14/2018: Negative  I discussed the results of the blood work and provided her with a copy of these results. There is no evidence of progression of MGUS to myeloma. Return to clinic in 1 year with labs done ahead of time.

## 2019-09-18 ENCOUNTER — Telehealth: Payer: Self-pay | Admitting: Hematology and Oncology

## 2019-09-18 NOTE — Telephone Encounter (Signed)
I left a message regarding schedule  

## 2019-09-19 ENCOUNTER — Other Ambulatory Visit (HOSPITAL_COMMUNITY): Payer: 59

## 2019-09-22 ENCOUNTER — Ambulatory Visit (INDEPENDENT_AMBULATORY_CARE_PROVIDER_SITE_OTHER): Payer: 59 | Admitting: Bariatrics

## 2019-09-22 ENCOUNTER — Other Ambulatory Visit: Payer: Self-pay

## 2019-09-22 ENCOUNTER — Telehealth: Payer: Self-pay | Admitting: Family Medicine

## 2019-09-22 ENCOUNTER — Encounter (INDEPENDENT_AMBULATORY_CARE_PROVIDER_SITE_OTHER): Payer: Self-pay | Admitting: Bariatrics

## 2019-09-22 VITALS — BP 83/57 | HR 85 | Temp 97.6°F | Ht 67.0 in | Wt 294.0 lb

## 2019-09-22 DIAGNOSIS — E039 Hypothyroidism, unspecified: Secondary | ICD-10-CM | POA: Diagnosis not present

## 2019-09-22 DIAGNOSIS — E119 Type 2 diabetes mellitus without complications: Secondary | ICD-10-CM | POA: Diagnosis not present

## 2019-09-22 DIAGNOSIS — Z9189 Other specified personal risk factors, not elsewhere classified: Secondary | ICD-10-CM

## 2019-09-22 DIAGNOSIS — Z1331 Encounter for screening for depression: Secondary | ICD-10-CM | POA: Diagnosis not present

## 2019-09-22 DIAGNOSIS — R9431 Abnormal electrocardiogram [ECG] [EKG]: Secondary | ICD-10-CM

## 2019-09-22 DIAGNOSIS — R0602 Shortness of breath: Secondary | ICD-10-CM | POA: Diagnosis not present

## 2019-09-22 DIAGNOSIS — E7849 Other hyperlipidemia: Secondary | ICD-10-CM | POA: Diagnosis not present

## 2019-09-22 DIAGNOSIS — Z0289 Encounter for other administrative examinations: Secondary | ICD-10-CM

## 2019-09-22 DIAGNOSIS — R5383 Other fatigue: Secondary | ICD-10-CM

## 2019-09-22 DIAGNOSIS — I5022 Chronic systolic (congestive) heart failure: Secondary | ICD-10-CM | POA: Diagnosis not present

## 2019-09-22 DIAGNOSIS — G4733 Obstructive sleep apnea (adult) (pediatric): Secondary | ICD-10-CM | POA: Diagnosis not present

## 2019-09-22 DIAGNOSIS — Z6841 Body Mass Index (BMI) 40.0 and over, adult: Secondary | ICD-10-CM

## 2019-09-22 DIAGNOSIS — E559 Vitamin D deficiency, unspecified: Secondary | ICD-10-CM

## 2019-09-22 NOTE — Telephone Encounter (Signed)
Called pt LVM for pt to call our office  Patient had scheduled 20 minute appt for annual exam should be 40 minute slot .please change  appt type when she calls back  FR

## 2019-09-22 NOTE — Progress Notes (Signed)
Dear Dr. Delia Chimes,   Thank you for referring Terri Heath Heath to our clinic. The following note includes my evaluation and treatment recommendations.  Chief Complaint:   Terri Heath Heath (MR# [id number]) is a 64 y.o. female who presents for evaluation and treatment of Terri Heath and related comorbidities. Current BMI is Body mass index is 46.05 kg/m.Marland Kitchen Terri Heath Heath has been struggling with her weight for many years and has been unsuccessful in either losing weight, maintaining weight loss, or reaching her healthy weight goal.  Terri Heath Heath is currently in the action stage of change and ready to dedicate time achieving and maintaining a healthier weight. Terri Heath Heath is interested in becoming our patient and working on intensive lifestyle modifications including (but not limited to) diet and exercise for weight loss.  Terri Heath Heath eats takeout 3 times a week. She likes to cook. She craves bread/sweets. She states that portion control is important to her. She struggles with cakes and pies.  Terri Heath Heath's habits were reviewed today and are as follows: her desired weight loss is 44 lbs, she has been heavy most of her life, she started gaining weight with her pregnancy, her heaviest weight ever was 386 pounds, she craves bread and sweets, and she struggles with emotional eating.  Depression Screen Terri Heath Heath (modified PHQ-9) score was 4.  Depression screen PHQ 2/9 09/22/2019  Decreased Interest 1  Down, Depressed, Hopeless 1  PHQ - 2 Score 2  Altered sleeping 0  Tired, decreased energy 1  Change in appetite 1  Feeling bad or failure about yourself  0  Trouble concentrating 0  Moving slowly or fidgety/restless 0  Suicidal thoughts 0  PHQ-9 Score 4  Difficult doing work/chores Not difficult at all   Subjective:   Other fatigue. Terri Heath Heath denies daytime somnolence and denies waking up still tired. Terri Heath Heath generally gets 8 hours of sleep per night, and states that she has generally  restful sleep. Snoring is present. Apneic episodes are not present. Epworth Sleepiness Score is 3.  Shortness of breath on exertion. Terri Heath Heath notes increasing shortness of breath with certain activities and seems to be worsening over time with weight gain. She notes getting out of breath sooner with activity than she used to. This has gotten worse recently. Terri Heath Heath denies shortness of breath at rest or orthopnea.  Other hyperlipidemia. Terri Heath Heath has hyperlipidemia and has been trying to improve her cholesterol levels with intensive lifestyle modification including a low saturated fat diet, exercise and weight loss. She denies any chest pain, claudication or myalgias. She is taking Repatha.  Lab Results  Component Value Date   ALT 18 09/10/2019   AST 12 (L) 09/10/2019   ALKPHOS 130 (H) 09/10/2019   BILITOT 0.3 09/10/2019   Lab Results  Component Value Date   CHOL 184 04/11/2019   HDL 57 04/11/2019   LDLCALC 92 04/11/2019   TRIG 174 (H) 04/11/2019   CHOLHDL 3.2 04/11/2019   OSA (obstructive sleep apnea). Terri Heath Heath has a diagnosis of sleep apnea. she reports that she is using a CPAP nightly. She reports her sleep to be restful.  Type 2 diabetes mellitus without complication, without long-term current use of insulin ([place]). Terri Heath Heath is taking metformin, Terri Heath Heath, and Terri Heath Heath (frequent urination). Her diabetes is poorly controlled.  Lab Results  Component Value Date   HGBA1C 14.0 (A) 04/11/2019   HGBA1C 10.3 (A) 07/24/2018   HGBA1C 11.9 (A) 04/18/2018   Lab Results  Component Value Date   MICROALBUR 2.2 03/14/2016   [name] [address]  04/11/2019   CREATININE 0.99 09/10/2019   No results found for: INSULIN  Chronic systolic congestive heart failure ([place]) - Class III. Terri Heath Heath is taking Coreg, Lasix, Entresto, and Aldactone.   Acquired hypothyroidism. Terri Heath Heath is taking levothyroxine.  Lab Results  Component Value Date   TSH 2.920 04/11/2019   Vitamin D deficiency. Terri Heath Heath is not taking  Vitamin D supplement.  Abnormal EKG. Terri Heath Heath has a history of myocardial infarction (silent per patient). We compared study from 05/06/2019, which was essentially normal.  Depression screening. Terri Heath Heath had a PHQ-9 score today of 4.  At risk for hypoglycemia. Terri Heath is at increased risk for hypoglycemia due to changes in diet, diagnosis of diabetes, and/or insulin use. Terri Heath Heath is not currently taking insulin.   Assessment/Plan:   Other fatigue. Terri Heath Heath does feel that her weight is causing her energy to be lower than it should be. Fatigue may be related to Terri Heath, depression or many other causes. Labs will be ordered, and in the meanwhile, Terri Heath Heath will focus on self care including making healthy food choices, increasing physical activity and focusing on stress reduction. EKG 12-Lead  Shortness of breath on exertion. Terri Heath Heath does feel that she gets out of breath more easily that she used to when she exercises. Terri Heath Heath shortness of breath appears to be Terri Heath related and exercise induced. She has agreed to work on weight loss and gradually increase exercise to treat her exercise induced shortness of breath. Will continue to monitor closely. [name] will gradually increase her activity.  Other hyperlipidemia. Cardiovascular risk and specific lipid/LDL goals reviewed.  We discussed several lifestyle modifications today and Terri Heath Heath will continue to work on diet, exercise and weight loss efforts. Orders and follow up as documented in patient record. She will continue her medications as prescribed.  Counseling Intensive lifestyle modifications are the first line treatment for this issue. . Dietary changes: Increase soluble fiber. Decrease simple carbohydrates. . Exercise changes: Moderate to vigorous-intensity aerobic activity 150 minutes per week if tolerated. Lipid-lowering medications: see documented in medical record.  OSA (obstructive sleep apnea). Intensive lifestyle  modifications are the first line treatment for this issue. We discussed several lifestyle modifications today and she will continue to work on diet, exercise and weight loss efforts. We will continue to monitor. Orders and follow up as documented in patient record. Terri Heath Heath will continue to use her CPAP.  Counseling  Sleep apnea is a condition in which breathing pauses or becomes shallow during sleep. This happens over and over during the night. This disrupts your sleep and keeps your body from getting the rest that it needs, which can cause tiredness and lack of energy (fatigue) during the day.  Sleep apnea treatment: If you were given a device to open your airway while you sleep, USE IT!  Sleep hygiene:   Limit or avoid alcohol, caffeinated beverages, and cigarettes, especially close to bedtime.   Do not eat a large meal or eat spicy foods right before bedtime. This can lead to digestive discomfort that can make it hard for you to sleep.  Keep a sleep diary to help you and your health care provider figure out what could be causing your insomnia.  . Make your bedroom a dark, comfortable place where it is easy to fall asleep. ? Put up shades or blackout curtains to block light from outside. ? Use a white noise machine to block noise. ? Keep the temperature cool. . Limit screen use before bedtime. This includes: ? Watching TV. ? Using your smartphone, tablet,  or computer. . Stick to a routine that includes going to bed and waking up at the same times every day and night. This can help you fall asleep faster. Consider making a quiet activity, such as reading, part of your nighttime routine. . Try to avoid taking naps during the day so that you sleep better at night. . Get out of bed if you are still awake after 15 minutes of trying to sleep. Keep the lights down, but try reading or doing a quiet activity. When you feel sleepy, go back to bed.  Type 2 diabetes mellitus without complication, without  long-term current use of insulin ([place]). Good blood sugar control is important to decrease the likelihood of diabetic complications such as nephropathy, neuropathy, limb loss, blindness, coronary artery disease, and death. Intensive lifestyle modification including diet, exercise and weight loss are the first line of treatment for diabetes. Terri Heath Heath will continue her medications, monitor for hypoglycemia or low blood sugars, and follow-up with her endocrinologist. C-peptide, Microalbumin / creatinine urine ratio, Hemoglobin A1c levels ordered.  Chronic systolic congestive heart failure ([place]). Terri Heath Heath will continue her medications and follow-up with her cardiologist.  Acquired hypothyroidism. Patient with long-standing hypothyroidism, on levothyroxine therapy. She appears euthyroid. Orders and follow up as documented in patient record. She will continue her levothyroxine.  Counseling . Good thyroid control is important for overall health. Supratherapeutic thyroid levels are dangerous and will not improve weight loss results. The correct way to take levothyroxine is fasting, with water, separated by at least 30 minutes from breakfast, and separated by more than 4 hours from calcium, iron, multivitamins, acid reflux medications (PPIs).   Vitamin D deficiency. Low Vitamin D level contributes to fatigue and are associated with Terri Heath, breast, and colon cancer. She will have routine testing of VITAMIN D 25 Hydroxy (Vit-D Deficiency, Fractures) today.  Abnormal EKG. Terri Heath Heath will follow-up with her cardiologist.  Depression screening. Terri Heath Heath had a negative depression screening today with a score of 4.  At risk for hypoglycemia. Terri Heath Heath was given approximately 15 minutes of counseling today regarding prevention of hypoglycemia. She was advised of symptoms of hypoglycemia. Terri Heath Heath was instructed to eat regular meals.   Class 3 severe Terri Heath with serious comorbidity and body mass index (BMI) of 45.0 to 49.9  in adult, unspecified Terri Heath type (Home).  Terri Heath Heath is currently in the action stage of change and her goal is to continue with weight loss efforts. I recommend Haelynn begin the structured treatment plan as follows:  She has agreed to the Category 3 Plan.  She will work on meal planning, intentional eating, and continue to work on portion control.  We reviewed labs including CMP, CBC, and glucose from 09/10/2019.  Exercise goals: For substantial health benefits, adults should do at least 150 minutes (2 hours and 30 minutes) a week of moderate-intensity, or 75 minutes (1 hour and 15 minutes) a week of vigorous-intensity aerobic physical activity, or an equivalent combination of moderate- and vigorous-intensity aerobic activity. Aerobic activity should be performed in episodes of at least 10 minutes, and preferably, it should be spread throughout the week. Adults should also include muscle-strengthening activities that involve all major muscle groups on 2 or more days a week.   Behavioral modification strategies: increasing lean protein intake, decreasing simple carbohydrates, increasing vegetables, increasing water intake, decreasing eating out, no skipping meals, meal planning and cooking strategies, keeping healthy foods in the home and planning for success.  She was informed of the importance of frequent follow-up visits to  maximize her success with intensive lifestyle modifications for her multiple health conditions. She was informed we would discuss her lab results at her next visit unless there is a critical issue that needs to be addressed sooner. Vasudha agreed to keep her next visit at the agreed upon time to discuss these results.  Objective:   Blood pressure (!) 83/57, pulse 85, temperature 97.6 F (36.4 C), temperature source Oral, height 5\' 7"  (1.702 m), weight 294 lb (133.4 kg), SpO2 95 %. Body mass index is 46.05 kg/m.  EKG: Sinus  Rhythm with a rate of 93 BPM. Short PR  syndrome. PRi = 114. Poor R-wave progression -may be secondary to pulmonary disease - consider old anterior infarct. Nonspecific T-abnormality. Low voltage - possible pulmonary disease.  Indirect Calorimeter completed today shows a VO2 of 275 and a REE of 1916.  Her calculated basal metabolic rate is 0000000 thus her basal metabolic rate is worse than expected.  General: Cooperative, alert, well developed, in no acute distress. HEENT: Conjunctivae and lids unremarkable. Cardiovascular: Regular rhythm.  Lungs: Normal work of breathing. Neurologic: No focal deficits.  Extremities: Pt wears support socks.  Lab Results  Component Value Date   CREATININE 0.99 09/10/2019   BUN 20 09/10/2019   NA 134 (L) 09/10/2019   K 4.5 09/10/2019   CL 97 (L) 09/10/2019   CO2 23 09/10/2019   Lab Results  Component Value Date   ALT 18 09/10/2019   AST 12 (L) 09/10/2019   ALKPHOS 130 (H) 09/10/2019   BILITOT 0.3 09/10/2019   Lab Results  Component Value Date   HGBA1C 14.0 (A) 04/11/2019   HGBA1C 10.3 (A) 07/24/2018   HGBA1C 11.9 (A) 04/18/2018   HGBA1C 14.0 (A) 01/16/2018   HGBA1C 11.9 01/18/2017   No results found for: INSULIN Lab Results  Component Value Date   TSH 2.920 04/11/2019   Lab Results  Component Value Date   CHOL 184 04/11/2019   HDL 57 04/11/2019   LDLCALC 92 04/11/2019   TRIG 174 (H) 04/11/2019   CHOLHDL 3.2 04/11/2019   Lab Results  Component Value Date   WBC 7.4 09/10/2019   HGB 13.9 09/10/2019   HCT 41.2 09/10/2019   MCV 85.3 09/10/2019   PLT 278 09/10/2019   Lab Results  Component Value Date   IRON 72 01/24/2018   TIBC 356 01/24/2018   FERRITIN 78 01/24/2018   Attestation Statements:   Reviewed by clinician on day of visit: allergies, medications, problem list, medical history, surgical history, family history, social history, and previous encounter notes.  Migdalia Dk, am acting as Location manager for [employer], DO   I have reviewed the above  documentation for accuracy and completeness, and I agree with the above. Jearld Lesch, DO

## 2019-09-23 LAB — HEMOGLOBIN A1C
Est. average glucose Bld gHb Est-mCnc: 367 mg/dL
Hgb A1c MFr Bld: 14.4 % — ABNORMAL HIGH (ref 4.8–5.6)

## 2019-09-23 LAB — C-PEPTIDE: C-Peptide: 4.4 ng/mL (ref 1.1–4.4)

## 2019-09-23 LAB — VITAMIN D 25 HYDROXY (VIT D DEFICIENCY, FRACTURES): Vit D, 25-Hydroxy: 22.4 ng/mL — ABNORMAL LOW (ref 30.0–100.0)

## 2019-09-23 LAB — MICROALBUMIN / CREATININE URINE RATIO
Creatinine, Urine: 93.9 mg/dL
Microalb/Creat Ratio: <3 mg/g creat (ref 0–29)
Microalbumin, Urine: <3 ug/mL

## 2019-09-24 ENCOUNTER — Ambulatory Visit (HOSPITAL_COMMUNITY)
Admission: RE | Admit: 2019-09-24 | Discharge: 2019-09-24 | Disposition: A | Discharge status: Home / Self Care | Payer: 59 | Source: Ambulatory Visit | Attending: Internal Medicine | Admitting: Internal Medicine

## 2019-09-24 ENCOUNTER — Other Ambulatory Visit: Payer: Self-pay

## 2019-09-24 DIAGNOSIS — I5022 Chronic systolic (congestive) heart failure: Secondary | ICD-10-CM | POA: Diagnosis not present

## 2019-09-24 LAB — BASIC METABOLIC PANEL
Anion gap: 9 (ref 5–15)
BUN: 25 mg/dL — ABNORMAL HIGH (ref 8–23)
CO2: 27 mmol/L (ref 22–32)
Calcium: 9.3 mg/dL (ref 8.9–10.3)
Chloride: 95 mmol/L — ABNORMAL LOW (ref 98–111)
Creatinine, Ser: 0.94 mg/dL (ref 0.44–1.00)
GFR calc Af Amer: >60 mL/min (ref >60)
GFR calc non Af Amer: >60 mL/min (ref >60)
Glucose, Bld: 360 mg/dL — ABNORMAL HIGH (ref 70–99)
Potassium: 5 mmol/L (ref 3.5–5.1)
Sodium: 131 mmol/L — ABNORMAL LOW (ref 135–145)

## 2019-09-30 ENCOUNTER — Other Ambulatory Visit: Payer: Self-pay

## 2019-10-01 ENCOUNTER — Other Ambulatory Visit: Payer: Self-pay | Admitting: Pharmacist

## 2019-10-01 ENCOUNTER — Ambulatory Visit: Payer: 59

## 2019-10-01 ENCOUNTER — Telehealth: Payer: Self-pay | Admitting: Pharmacist

## 2019-10-01 DIAGNOSIS — E785 Hyperlipidemia, unspecified: Secondary | ICD-10-CM

## 2019-10-01 MED ORDER — REPATHA SURECLICK 140 MG/ML ~~LOC~~ SOAJ: 1.0000 "pen " | SUBCUTANEOUS | 11 refills | Status: DC

## 2019-10-01 MED FILL — REPATHA SURECLICK 140 MG/ML: 140 | 28 days supply | Qty: 2 | Fill #0

## 2019-10-01 NOTE — Telephone Encounter (Signed)
Pt referred to lipid clinic. She was previously seen in 2019 (statin intolerant to pravastatin 10mg  and 20mg  daily (leg cramps), atorvastatin, simvastatin, rosuvastatin, lovastatin, Livalo, Zetia) and was started on Repatha. Pt mentioned at most recent visit with Dr Aundra Dubin that she had run out of her Repatha about 1 month previously. Per our records, her prior authorization was still good, just looked like she needed a new rx sent in.  Sent refill to [facility] and reactivated $5 copay card - confirmed pt can pick up rx today. Pt is aware to resume Repatha injections every 14 days. Scheduled f/u lab work in 2 months to assess efficacy. LDL goal < 70 with hx of CAD, DM, and CHF.

## 2019-10-01 NOTE — Progress Notes (Signed)
repatha °

## 2019-10-01 NOTE — Progress Notes (Deleted)
Patient ID: Terri Heath                 DOB: 10-01-1955                    MRN: [id number]     HPI: Terri Heath is a 64 y.o. female patient referred to lipid clinic by Dr Aundra Dubin. PMH is significant for chronic systolic CHF with LVEF [id number] 5/19 >> 30-35% 10/19 >> 40-45% 10/20, CAD s/p prior MI, mitral regurgitation, MGUS with IgM monoclonal antibody, DM2, OSA, hypothyroidism, and HLD. She was previously seen in lipid clinic in 2019 and started on Repatha with improvement in LDL from 187 to 82. Most recently, she was seen in CHF clinic in January and reported being out of Repatha for at least a month. She presents today to re-establish on therapy.  Just needed refill? New prior auth sent 2/3  Current Medications: none - ran out of Repatha Intolerances: pravastatin 10mg  and 20mg  daily (leg cramps), atorvastatin, simvastatin, rosuvastatin, lovastatin, Livalo, Zetia Risk Factors: CAD s/p MI, CHF, DM, obesity LDL goal: 70mg /dL  Diet:   Exercise:   Family History: The patient's family history includes Alcohol abuse in her father and mother; Arthritis in her mother; Diabetes in her mother; Heart disease in her father; Sleep apnea in her mother and sister.  Social History: The patientreports that she quit smoking in 1991. Her smoking use included cigarettes. She has a 15.00 pack-year smoking history. She has never used smokeless tobacco. She reports that she does not drink alcohol or use drugs. Works as a Nurse, adult.  Labs: 04/11/19: TC 184, TG 174, HDL 57, LDL 92 (Repatha 140mg  Q2W) 07/31/18: TC 169, TG 133, HDL 60, LDL 82 (Repatha 140mg  Q2W) 06/06/18: TC 266, TG 111, HDL 57, LDL 187 (no lipid lowering therapy)  Past Medical History:  Diagnosis Date  . Allergy   . Asthma   . Bilateral shoulder pain   . Borderline hypertension   . Bronchitis   . CAD (coronary artery disease)   . Chronic systolic CHF (congestive heart failure) ([place])   . Diabetes mellitus    Diagnosed in 2000  . History of MI (myocardial infarction)   . Hyperlipidemia   . Hypertension   . Hypothyroidism   . MGUS (monoclonal gammopathy of unknown significance)   . Mitral regurgitation   . Obesity   . Sleep apnea   . Statin intolerance     Current Outpatient Medications on File Prior to Visit  Medication Sig Dispense Refill  . albuterol (PROAIR HFA) 108 (90 Base) MCG/ACT inhaler Inhale 2 puffs into the lungs every 6 (six) hours as needed. wheezing 18 g 2  . aspirin 81 MG chewable tablet TAKE 1 TABLET BY MOUTH DAILY. 90 tablet 0  . bismuth subsalicylate (PEPTO BISMOL) 262 MG chewable tablet Chew 524 mg by mouth as needed for indigestion or diarrhea or loose stools.     . carvedilol (COREG) 12.5 MG tablet TAKE 1 TABLET BY MOUTH 2 TIMES DAILY. 180 tablet 3  . cetirizine (ZYRTEC) 10 MG tablet Take 10 mg by mouth as needed.     . fluticasone (FLONASE) 50 MCG/ACT nasal spray Place 2 sprays into both nostrils daily as needed for allergies or rhinitis.    . furosemide (LASIX) 40 MG tablet Take 1 tablet (40 mg total) by mouth every morning AND 0.5 tablets (20 mg total) every evening. 45 tablet 5  . levothyroxine (SYNTHROID) 50 MCG tablet TAKE  1 TABLET BY MOUTH DAILY BEFORE BREAKFAST. 90 tablet 3  . magnesium oxide (MAG-OX) 400 MG tablet Take 400 mg by mouth at bedtime as needed (for cramps).    . metFORMIN (GLUCOPHAGE) 1000 MG tablet Take 1 tablet (1,000 mg total) by mouth 2 (two) times daily with a meal. 180 tablet 1  . Multiple Vitamin (MULITIVITAMIN WITH MINERALS) TABS Take 1 tablet by mouth daily with breakfast.    . sacubitril-valsartan (ENTRESTO) 97-103 MG Take 1 tablet by mouth 2 (two) times daily. 60 tablet 5  . spironolactone (ALDACTONE) 25 MG tablet Take 1 tablet (25 mg total) by mouth daily. 90 tablet 3   No current facility-administered medications on file prior to visit.    Allergies  Allergen Reactions  . Amoxicillin Other (See Comments)    Dizziness, abdominal pain    . Adhesive [Tape] Rash  . Exenatide Other (See Comments)    Flu-like symptoms  . Invokana [Canagliflozin] Other (See Comments)    Yeast infection/dehydration  . Lisinopril Other (See Comments)    cramping  . Other Diarrhea and Other (See Comments)    Kale=food  Per patient, it causes GI bleeding   . Statins Other (See Comments)    Leg cramps    Assessment/Plan:  1. Hyperlipidemia -

## 2019-10-02 ENCOUNTER — Other Ambulatory Visit: Payer: Self-pay

## 2019-10-02 ENCOUNTER — Encounter: Payer: Self-pay | Admitting: Internal Medicine

## 2019-10-02 ENCOUNTER — Ambulatory Visit: Payer: 59 | Admitting: Internal Medicine

## 2019-10-02 VITALS — BP 112/60 | HR 87 | Ht 67.0 in | Wt 302.0 lb

## 2019-10-02 DIAGNOSIS — E1165 Type 2 diabetes mellitus with hyperglycemia: Secondary | ICD-10-CM

## 2019-10-02 DIAGNOSIS — E1159 Type 2 diabetes mellitus with other circulatory complications: Secondary | ICD-10-CM | POA: Diagnosis not present

## 2019-10-02 DIAGNOSIS — E7849 Other hyperlipidemia: Secondary | ICD-10-CM | POA: Diagnosis not present

## 2019-10-02 MED ORDER — FREESTYLE LITE TEST VI STRP: ORAL_STRIP | 3 refills | Status: DC

## 2019-10-02 MED ORDER — INSULIN PEN NEEDLE 32G X 4 MM MISC: 3 refills | Status: DC

## 2019-10-02 MED ORDER — TRESIBA FLEXTOUCH 200 UNIT/ML ~~LOC~~ SOPN: 40.0000 [IU] | PEN_INJECTOR | Freq: Every day | SUBCUTANEOUS | 3 refills | Status: DC

## 2019-10-02 MED ORDER — TRULICITY 1.5 MG/0.5ML ~~LOC~~ SOAJ: 1.5000 mg | SUBCUTANEOUS | 11 refills | Status: DC

## 2019-10-02 MED FILL — UNIFINE PENTIPS 32GX5/32": 32G X 4 MM | 90 days supply | Qty: 100 | Fill #0

## 2019-10-02 MED FILL — UNIFINE PENTIPS 32GX5/32: 32G X 4 MM | 90 days supply | Qty: 100 | Fill #0

## 2019-10-02 MED FILL — TRESIBA FLEXTOUCH 200 UNITS: 200 | 30 days supply | Qty: 6 | Fill #0

## 2019-10-02 MED FILL — TRULICITY 1.5 MG/0.5 ML PEN: 1.5 | 28 days supply | Qty: 2 | Fill #0

## 2019-10-02 MED FILL — FREESTYLE LITE TEST STRIP: 75 days supply | Qty: 150 | Fill #0

## 2019-10-02 NOTE — Patient Instructions (Addendum)
Please continue: - Metformin 1000 mg 2x a day with meals  Please restart: - Trulicity 1.5 mg weekly Let me know about the sugars in ~ 3 weeks to see if you need the 3 mg dose.  Please increase: - Tresiba to 36 units daily and may need to increase further, by 4 units every 4 days until am sugars are <140  Please return in 3 months with your sugar log.

## 2019-10-02 NOTE — Progress Notes (Signed)
Patient ID: Terri Heath, female   DOB: [birth year], 64 y.o.   MRN: [id number]   This visit occurred during the SARS-CoV-2 public health emergency.  Safety protocols were in place, including screening questions prior to the visit, additional usage of staff PPE, and extensive cleaning of exam room while observing appropriate contact time as indicated for disinfecting solutions.   HPI: Terri Heath is a 64 y.o.-year-old female, returning for f/u for DM2, dx in 2003, insulin-dependent, uncontrolled, with complications (PVD, CAD, sCHF, DR). 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  She did not check sugars until 07/2019 when she started to feel poorly and to have blurry vision and increased urination >> CBG was found to be 490 at the last visit with PCP.  An HbA1c also returned very high, at 14.4%.  Patient restarted insulin by herself since she had some left from before.  Review latest HbA1c levels: Lab Results  Component Value Date   HGBA1C 14.4 (H) 09/22/2019   HGBA1C 14.0 (A) 04/11/2019   HGBA1C 10.3 (A) 07/24/2018   HGBA1C 11.9 (A) 04/18/2018   HGBA1C 14.0 (A) 01/16/2018   HGBA1C 11.9 01/18/2017   HGBA1C 11.4 09/27/2016   HGBA1C 9.8 (H) 07/05/2016   HGBA1C 9.9 (H) 03/14/2016   HGBA1C 10.0 (H) 10/27/2015   She is on: - Metformin 1000 mg 2x a day with meals -  >> ran out  07/2019 - Tresiba 30 units daily - started 09/10/2019 - >> stopped b/c dehydration and yeast infection  She has been on: - Actos - JanuMet - Invokana >> yeast vaginitis - [place] >> flu-like sxs - Humalog >> wt gain  - stopped insulin 2015 >> stopped >> lost 90 lbs! - Glipizide >> leg edema - Tyler Aas - 06/2018 >> stopped because of weight gain  Pt checks her sugars 1-2 a day: - am: 230s >> 180-225 >> 247-276 - 2h after b'fast: n/c - before lunch: 284 - 2h after lunch: n/c - before dinner: 268 - 2h after dinner: 275-297 - bedtime: n/c - nighttime: n/c >> 292, 301 Lowest sugar was 198 >> 180 >> 247. she  has hypoglycemia awareness at 116.  Highest sugar was 311 >> 225 >> 490.  Glucometer: ?  Pt's meals are: - Breakfast: 2 eggs + bacon or coffee - Lunch: meat + green veggies - Dinner: meat + veggies  -No CKD, last BUN/creatinine:  Lab Results  Component Value Date   BUN 25 (H) 09/24/2019   BUN 20 09/10/2019   CREATININE 0.94 09/24/2019   CREATININE 0.99 09/10/2019  On Entresto.  -+ HL; last set of lipids: Lab Results  Component Value Date   CHOL 184 04/11/2019   HDL 57 04/11/2019   LDLCALC 92 04/11/2019   TRIG 174 (H) 04/11/2019   CHOLHDL 3.2 04/11/2019  She is on Repatha.  She could not tolerate statins in the past due to muscle aches.  - last eye exam was in 2021: + DR - + numbness and tingling in her feet. On ASA 81.  Pt has FH of DM in PGM, P aunts and uncles, M.  She also has a history of MGUs, OSA, hypothyroidism.  She is on levothyroxine 50 mcg daily.  Latest TSH was reviewed and this was normal: Lab Results  Component Value Date   TSH 2.920 04/11/2019   ROS: Constitutional: + Both weight gain and loss, no fatigue, no subjective hyperthermia, no subjective hypothermia, + increased urination Eyes: + Blurry vision, no xerophthalmia ENT: no sore  throat, no nodules palpated in neck, no dysphagia, no odynophagia, no hoarseness Cardiovascular: no CP/no SOB/no palpitations/no leg swelling Respiratory: no cough/no SOB/no wheezing Gastrointestinal: no N/no V/no D/no C/no acid reflux Musculoskeletal: no muscle aches/no joint aches Skin: no rashes, no hair loss Neurological: no tremors/no numbness/no tingling/no dizziness  I reviewed pt's medications, allergies, PMH, social hx, family hx, and changes were documented in the history of present illness. Otherwise, unchanged from my initial visit note.  Past Medical History:  Diagnosis Date  . Allergy   . Asthma   . Bilateral shoulder pain   . Borderline hypertension   . Bronchitis   . CAD (coronary artery disease)    . Chronic systolic CHF (congestive heart failure) ([place])   . Diabetes mellitus    Diagnosed in 2000  . History of MI (myocardial infarction)   . Hyperlipidemia   . Hypertension   . Hypothyroidism   . MGUS (monoclonal gammopathy of unknown significance)   . Mitral regurgitation   . Obesity   . Sleep apnea   . Statin intolerance    Past Surgical History:  Procedure Laterality Date  . BREATH TEK H PYLORI  09/18/2011   Procedure: BREATH TEK H PYLORI;  Surgeon: Shann Medal, MD;  Location: [facility];  Service: General;  Laterality: N/A;  . CESAREAN SECTION     x 3  . LAPAROSCOPIC APPENDECTOMY N/A 10/03/2017   Procedure: APPENDECTOMY LAPAROSCOPIC;  Surgeon: Leighton Ruff, MD;  Location: [facility];  Service: General;  Laterality: N/A;  . RIGHT/LEFT HEART CATH AND CORONARY ANGIOGRAPHY N/A 02/12/2018   Procedure: RIGHT/LEFT HEART CATH AND CORONARY ANGIOGRAPHY;  Surgeon: Jettie Booze, MD;  Location: [facility];  Service: Cardiovascular;  Laterality: N/A;  . TUBAL LIGATION     Social History   Socioeconomic History  . Marital status: Married    Spouse name: Not on file  . Number of children: Not on file  . Years of education: Not on file  . Highest education level: Not on file  Occupational History  . Not on file  Tobacco Use  . Smoking status: Former Smoker    Packs/day: 1.00    Years: 15.00    Pack years: 15.00    Types: Cigarettes    Quit date: 01/28/1990    Years since quitting: 29.6  . Smokeless tobacco: Never Used  Substance and Sexual Activity  . Alcohol use: No  . Drug use: No  . Sexual activity: Not Currently  Other Topics Concern  . 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 Difficulty of Paying Living Expenses: Not on file  Food Insecurity:   . Worried About Charity fundraiser in the Last Year: Not on file  . Ran  Out of Food in the Last Year: Not on file  Transportation Needs:   . Lack of Transportation (Medical): Not on file  . Lack of Transportation (Non-Medical): Not on file  Physical Activity:   . Days of Exercise per Week: Not on file  . Minutes of Exercise per Session: Not on file  Stress:   . Feeling of Stress : Not on file  Social Connections:   . Frequency of Communication with Friends and Family: Not on file  . Frequency of Social Gatherings with Friends and Family: Not on file  . Attends  Religious Services: Not on file  . Active Member of Clubs or Organizations: Not on file  . Attends Archivist Meetings: Not on file  . Marital Status: Not on file  Intimate Partner Violence:   . Fear of Current or Ex-Partner: Not on file  . Emotionally Abused: Not on file  . Physically Abused: Not on file  . Sexually Abused: Not on file   Current Outpatient Medications on File Prior to Visit  Medication Sig Dispense Refill  . albuterol (PROAIR HFA) 108 (90 Base) MCG/ACT inhaler Inhale 2 puffs into the lungs every 6 (six) hours as needed. wheezing 18 g 2  . aspirin 81 MG chewable tablet TAKE 1 TABLET BY MOUTH DAILY. 90 tablet 0  . bismuth subsalicylate (PEPTO BISMOL) 262 MG chewable tablet Chew 524 mg by mouth as needed for indigestion or diarrhea or loose stools.     . carvedilol (COREG) 12.5 MG tablet TAKE 1 TABLET BY MOUTH 2 TIMES DAILY. 180 tablet 3  . cetirizine (ZYRTEC) 10 MG tablet Take 10 mg by mouth as needed.     . Evolocumab (REPATHA SURECLICK) [id number] MG/ML SOAJ Inject 1 pen into the skin every 14 (fourteen) days. 2 pen 11  . fluticasone (FLONASE) 50 MCG/ACT nasal spray Place 2 sprays into both nostrils daily as needed for allergies or rhinitis.    . furosemide (LASIX) 40 MG tablet Take 1 tablet (40 mg total) by mouth every morning AND 0.5 tablets (20 mg total) every evening. 45 tablet 5  . levothyroxine (SYNTHROID) 50 MCG tablet TAKE 1 TABLET BY MOUTH DAILY BEFORE BREAKFAST. 90  tablet 3  . magnesium oxide (MAG-OX) 400 MG tablet Take 400 mg by mouth at bedtime as needed (for cramps).    . metFORMIN (GLUCOPHAGE) 1000 MG tablet Take 1 tablet (1,000 mg total) by mouth 2 (two) times daily with a meal. 180 tablet 1  . Multiple Vitamin (MULITIVITAMIN WITH MINERALS) TABS Take 1 tablet by mouth daily with breakfast.    . sacubitril-valsartan (ENTRESTO) 97-103 MG Take 1 tablet by mouth 2 (two) times daily. 60 tablet 5  . spironolactone (ALDACTONE) 25 MG tablet Take 1 tablet (25 mg total) by mouth daily. 90 tablet 3   No current facility-administered medications on file prior to visit.   Allergies  Allergen Reactions  . Amoxicillin Other (See Comments)    Dizziness, abdominal pain  . Adhesive [Tape] Rash  . Exenatide Other (See Comments)    Flu-like symptoms  . Invokana [Canagliflozin] Other (See Comments)    Yeast infection/dehydration  . Lisinopril Other (See Comments)    cramping  . Other Diarrhea and Other (See Comments)    Kale=food  Per patient, it causes GI bleeding   . Statins Other (See Comments)    Leg cramps   Family History  Problem Relation Age of Onset  . Alcohol abuse Mother   . Arthritis Mother   . Diabetes Mother   . Sleep apnea Mother   . Anxiety disorder Mother   . Eating disorder Mother   . Obesity Mother   . Alcohol abuse Father   . Heart disease Father   . Hypertension Father   . Stroke Father   . Cancer Father   . Sleep apnea Sister   . Breast cancer Neg Hx     PE: BP 112/60   Pulse 87   Ht 5\' 7"  (1.702 m)   Wt (!) 302 lb (137 kg)   SpO2 95%   BMI 47.30  kg/m  Wt Readings from Last 3 Encounters:  10/02/19 (!) 302 lb (137 kg)  09/22/19 294 lb (133.4 kg)  09/04/19 (!) 302 lb 6.4 oz (137.2 kg)   Constitutional: overweight, in NAD Eyes: PERRLA, EOMI, no exophthalmos ENT: moist mucous membranes, no thyromegaly, no cervical lymphadenopathy Cardiovascular: RRR, No MRG Respiratory: CTA B Gastrointestinal: abdomen soft, NT,  ND, BS+ Musculoskeletal: no deformities, strength intact in all 4 Skin: moist, warm, no rashes Neurological: no tremor with outstretched hands, DTR normal in all 4  ASSESSMENT: 1. DM2, non-insulin-dependent, uncontrolled, with long-term complications - PVD - CAD, sCHF - DR  2. HL  3.  Obesity  PLAN:  1. Patient with longstanding, uncontrolled, type 2 diabetes, currently a regimen containing Metformin, SGLT2 inhibitor, and weekly GLP-1 receptor agonist.  We started insulin repeatedly in the past but she stopped due to weight gain and did not want to restart at last visit.  She wanted to continue to work on her diet.  At last visit we added Jardiance, but I strongly advised her to reconsider her decision to start insulin.  She now returns 9 months later. -At this visit, she tells me that she started to feel very poorly with fatigue, weight loss, increased urination, and blurry vision around Christmas time.  Blood sugar checked in PCPs office was in the 490s.  HbA1c was also very high, at 14.4%.  At that time, she was off Trulicity, after running out and she also had to stop Jardiance due to increased urination.  She was continuing with Metformin.  She did have insulin at home so she restarted Tresiba 10 units daily and increase to 30 units daily now.  She now recognizes that she absolutely needs insulin to control her diabetes. -At this visit, we discussed about restarting Trulicity and I advised her to let me know how her sugars in 3 weeks so we can increase the dose if needed.  We will also increase the dose of Tresiba and send new prescriptions to her pharmacy.  We will continue Metformin, but from now on, we probably will not be able to use SGLT2 inhibitors for her due to dehydration, increased urination, and also yeast infections. - I suggested to:  Patient Instructions  Please continue: - Metformin 1000 mg 2x a day with meals  Please restart: - Trulicity 1.5 mg weekly Let me know about  the sugars in ~ 3 weeks to see if you need the 3 mg dose.  Please increase: - Tresiba to 36 units daily and may need to increase further, by 4 units every 4 days until am sugars are <140  Please return in 3 months with your sugar log.   - advised to check sugars at different times of the day - 2x a day, rotating check times - advised for yearly eye exams >> she is UTD - return to clinic in 3-4 months   2. HL -Reviewed latest lipid panel from 03/2019: LDL above target of less than 70, triglycerides slightly high: Lab Results  Component Value Date   CHOL 184 04/11/2019   HDL 57 04/11/2019   LDLCALC 92 04/11/2019   TRIG 174 (H) 04/11/2019   CHOLHDL 3.2 04/11/2019  - She could not tolerate statins due to leg cramps.  Continues Repatha-no side effects.  3.  Obesity -will restart GLP-1 receptor agonist which should also help with weight loss -She gained 14 pounds since last visit up to 04/2019, then lost weight 2/2 poor DM control >> now gained  8 lbs back  Philemon Kingdom, MD PhD [facility]

## 2019-10-05 ENCOUNTER — Encounter (INDEPENDENT_AMBULATORY_CARE_PROVIDER_SITE_OTHER): Payer: Self-pay | Admitting: Bariatrics

## 2019-10-06 ENCOUNTER — Ambulatory Visit (INDEPENDENT_AMBULATORY_CARE_PROVIDER_SITE_OTHER): Payer: 59 | Admitting: Bariatrics

## 2019-10-08 ENCOUNTER — Ambulatory Visit: Payer: 59 | Admitting: Family Medicine

## 2019-10-14 ENCOUNTER — Encounter: Payer: 59 | Admitting: Family Medicine

## 2019-10-14 ENCOUNTER — Other Ambulatory Visit: Payer: Self-pay | Admitting: Internal Medicine

## 2019-10-14 ENCOUNTER — Other Ambulatory Visit (HOSPITAL_COMMUNITY): Payer: Self-pay | Admitting: Cardiology

## 2019-10-14 DIAGNOSIS — E119 Type 2 diabetes mellitus without complications: Secondary | ICD-10-CM

## 2019-10-14 MED FILL — metFORMIN HCL 1000 MG TABS: 1000 | 90 days supply | Qty: 180 | Fill #0

## 2019-10-14 MED FILL — LEVOTHYROXINE 50 MCG TABLET: 50 | 90 days supply | Qty: 90 | Fill #3

## 2019-10-14 MED FILL — ASPIRIN CHILD 81 MG TAB CHE: 81 | 90 days supply | Qty: 90 | Fill #0

## 2019-10-15 ENCOUNTER — Encounter (HOSPITAL_COMMUNITY): Payer: Self-pay | Admitting: *Deleted

## 2019-10-15 ENCOUNTER — Telehealth (HOSPITAL_COMMUNITY): Payer: Self-pay | Admitting: *Deleted

## 2019-10-15 ENCOUNTER — Other Ambulatory Visit (HOSPITAL_COMMUNITY): Payer: Self-pay | Admitting: *Deleted

## 2019-10-15 MED ORDER — ENTRESTO 49-51 MG PO TABS: 1.0000 | ORAL_TABLET | Freq: Two times a day (BID) | ORAL | 3 refills | Status: DC

## 2019-10-15 MED FILL — ENTRESTO 49 MG-51 MG TABLET: 49-51 | 30 days supply | Qty: 60 | Fill #0

## 2019-10-15 NOTE — Telephone Encounter (Signed)
Decrease Entresto back to 49/51 bid.

## 2019-10-15 NOTE — Telephone Encounter (Signed)
Pt left VM stating since entresto increase she has experienced low bp (usually 88/60) and feels weak wants to know if she can decrease entresto. Routed to [place] for advice

## 2019-10-16 ENCOUNTER — Other Ambulatory Visit (HOSPITAL_COMMUNITY): Payer: Self-pay | Admitting: *Deleted

## 2019-10-21 DIAGNOSIS — D225 Melanocytic nevi of trunk: Secondary | ICD-10-CM | POA: Diagnosis not present

## 2019-10-21 DIAGNOSIS — L570 Actinic keratosis: Secondary | ICD-10-CM | POA: Diagnosis not present

## 2019-10-21 DIAGNOSIS — L821 Other seborrheic keratosis: Secondary | ICD-10-CM | POA: Diagnosis not present

## 2019-10-21 DIAGNOSIS — D1801 Hemangioma of skin and subcutaneous tissue: Secondary | ICD-10-CM | POA: Diagnosis not present

## 2019-10-21 DIAGNOSIS — D485 Neoplasm of uncertain behavior of skin: Secondary | ICD-10-CM | POA: Diagnosis not present

## 2019-10-28 MED FILL — TRULICITY 1.5 MG/0.5 ML PEN: 1.5 | 28 days supply | Qty: 2 | Fill #1

## 2019-10-28 MED FILL — SPIRONOLACTONE 25 MG TABS: 25 | 90 days supply | Qty: 90 | Fill #1

## 2019-10-28 MED FILL — REPATHA SURECLICK 140 MG/ML: 140 | 28 days supply | Qty: 2 | Fill #1

## 2019-10-30 DIAGNOSIS — L570 Actinic keratosis: Secondary | ICD-10-CM | POA: Diagnosis not present

## 2019-11-10 MED FILL — ENTRESTO 49 MG-51 MG TABLET: 49-51 | 30 days supply | Qty: 60 | Fill #1

## 2019-11-10 MED FILL — CARVEDILOL 12.5 MG TABLET: 12.5 | 90 days supply | Qty: 180 | Fill #2

## 2019-11-17 ENCOUNTER — Other Ambulatory Visit: Payer: 59

## 2019-11-25 ENCOUNTER — Other Ambulatory Visit: Payer: 59

## 2019-11-25 ENCOUNTER — Other Ambulatory Visit: Payer: Self-pay

## 2019-11-25 DIAGNOSIS — E785 Hyperlipidemia, unspecified: Secondary | ICD-10-CM

## 2019-11-25 LAB — LIPID PANEL
Chol/HDL Ratio: 3 ratio (ref 0.0–4.4)
Cholesterol, Total: 158 mg/dL (ref 100–199)
HDL: 53 mg/dL (ref >39)
LDL Chol Calc (NIH): 81 mg/dL (ref 0–99)
Triglycerides: 137 mg/dL (ref 0–149)
VLDL Cholesterol Cal: 24 mg/dL (ref 5–40)

## 2019-11-25 LAB — HEPATIC FUNCTION PANEL
ALT: 20 IU/L (ref 0–32)
AST: 16 IU/L (ref 0–40)
Albumin: 3.6 g/dL — ABNORMAL LOW (ref 3.8–4.8)
Alkaline Phosphatase: 79 IU/L (ref 39–117)
Bilirubin Total: 0.3 mg/dL (ref 0.0–1.2)
Bilirubin, Direct: 0.1 mg/dL (ref 0.00–0.40)
Total Protein: 7 g/dL (ref 6.0–8.5)

## 2019-11-25 MED FILL — REPATHA SURECLICK 140 MG/ML: 140 | 28 days supply | Qty: 2 | Fill #2

## 2019-11-25 MED FILL — TRULICITY 1.5 MG/0.5 ML PEN: 1.5 | 28 days supply | Qty: 2 | Fill #2

## 2019-11-26 ENCOUNTER — Telehealth: Payer: Self-pay | Admitting: Pharmacist

## 2019-11-26 NOTE — Telephone Encounter (Signed)
LDL has improved from baseline of 187 to 81 on lipid panel from yesterday since resuming Repatha. She is previously intolerant to pravastatin 10 and 20mg  daily, atorvastatin, rosuvastatin, simvastatin, lovastatin, Livalo, and Zetia. LDL goal < 70 due to history of CAD.   Called patient to discuss adding Nexletol for additional 20% LDL lowering to bring LDL to goal. This is covered well by her insurance plan and is tolerated very well. Pt is strongly opposed to starting any new medication for her cholesterol and states she does not like taking her Repatha in the first place. She does understand the need to continue on her Repatha injections. Provided her with additional information on Nexletol and advised her to call us if she changes her mind.

## 2019-12-03 ENCOUNTER — Other Ambulatory Visit (HOSPITAL_COMMUNITY): Payer: 59

## 2019-12-17 MED FILL — ENTRESTO 49 MG-51 MG TABLET: 49-51 | 30 days supply | Qty: 60 | Fill #2

## 2019-12-30 ENCOUNTER — Encounter (HOSPITAL_COMMUNITY): Payer: 59 | Admitting: Cardiology

## 2019-12-30 ENCOUNTER — Other Ambulatory Visit: Payer: Self-pay | Admitting: Interventional Cardiology

## 2019-12-30 ENCOUNTER — Ambulatory Visit: Payer: 59 | Admitting: Internal Medicine

## 2019-12-30 MED FILL — TRULICITY 1.5 MG/0.5 ML PEN: 1.5 | 28 days supply | Qty: 2 | Fill #3

## 2019-12-30 MED FILL — REPATHA SURECLICK 140 MG/ML: 140 | 28 days supply | Qty: 2 | Fill #3

## 2019-12-30 MED FILL — FUROSEMIDE 40 MG TAB: 40 | 90 days supply | Qty: 180 | Fill #2

## 2019-12-31 MED FILL — LEVOTHYROXINE 50 MCG TABLET: 50 | 90 days supply | Qty: 90 | Fill #0

## 2020-01-14 MED FILL — ENTRESTO 49 MG-51 MG TABLET: 49-51 | 30 days supply | Qty: 60 | Fill #3

## 2020-01-14 MED FILL — metFORMIN HCL 1000 MG TABS: 1000 | 90 days supply | Qty: 180 | Fill #1

## 2020-01-15 ENCOUNTER — Ambulatory Visit (INDEPENDENT_AMBULATORY_CARE_PROVIDER_SITE_OTHER)
Admission: RE | Admit: 2020-01-15 | Discharge: 2020-01-15 | Disposition: A | Discharge status: Home / Self Care | Payer: 59 | Source: Ambulatory Visit

## 2020-01-15 ENCOUNTER — Other Ambulatory Visit: Payer: Self-pay

## 2020-01-15 ENCOUNTER — Encounter (HOSPITAL_COMMUNITY): Payer: Self-pay

## 2020-01-15 ENCOUNTER — Ambulatory Visit (HOSPITAL_COMMUNITY)
Admission: EM | Admit: 2020-01-15 | Discharge: 2020-01-15 | Disposition: A | Discharge status: Home / Self Care | Payer: 59 | Attending: Internal Medicine | Admitting: Internal Medicine

## 2020-01-15 DIAGNOSIS — Z888 Allergy status to other drugs, medicaments and biological substances status: Secondary | ICD-10-CM | POA: Diagnosis not present

## 2020-01-15 DIAGNOSIS — Z8349 Family history of other endocrine, nutritional and metabolic diseases: Secondary | ICD-10-CM | POA: Diagnosis not present

## 2020-01-15 DIAGNOSIS — I110 Hypertensive heart disease with heart failure: Secondary | ICD-10-CM | POA: Diagnosis not present

## 2020-01-15 DIAGNOSIS — Z79899 Other long term (current) drug therapy: Secondary | ICD-10-CM | POA: Diagnosis not present

## 2020-01-15 DIAGNOSIS — E1159 Type 2 diabetes mellitus with other circulatory complications: Secondary | ICD-10-CM | POA: Diagnosis not present

## 2020-01-15 DIAGNOSIS — Z20822 Contact with and (suspected) exposure to covid-19: Secondary | ICD-10-CM | POA: Insufficient documentation

## 2020-01-15 DIAGNOSIS — Z880 Allergy status to penicillin: Secondary | ICD-10-CM | POA: Diagnosis not present

## 2020-01-15 DIAGNOSIS — Z794 Long term (current) use of insulin: Secondary | ICD-10-CM | POA: Diagnosis not present

## 2020-01-15 DIAGNOSIS — R197 Diarrhea, unspecified: Secondary | ICD-10-CM | POA: Diagnosis not present

## 2020-01-15 DIAGNOSIS — E039 Hypothyroidism, unspecified: Secondary | ICD-10-CM | POA: Diagnosis not present

## 2020-01-15 DIAGNOSIS — D472 Monoclonal gammopathy: Secondary | ICD-10-CM | POA: Diagnosis not present

## 2020-01-15 DIAGNOSIS — Z87891 Personal history of nicotine dependence: Secondary | ICD-10-CM | POA: Insufficient documentation

## 2020-01-15 DIAGNOSIS — Z8249 Family history of ischemic heart disease and other diseases of the circulatory system: Secondary | ICD-10-CM | POA: Insufficient documentation

## 2020-01-15 DIAGNOSIS — R739 Hyperglycemia, unspecified: Secondary | ICD-10-CM

## 2020-01-15 DIAGNOSIS — G4733 Obstructive sleep apnea (adult) (pediatric): Secondary | ICD-10-CM | POA: Insufficient documentation

## 2020-01-15 DIAGNOSIS — I255 Ischemic cardiomyopathy: Secondary | ICD-10-CM | POA: Diagnosis not present

## 2020-01-15 DIAGNOSIS — F329 Major depressive disorder, single episode, unspecified: Secondary | ICD-10-CM | POA: Diagnosis not present

## 2020-01-15 DIAGNOSIS — J45909 Unspecified asthma, uncomplicated: Secondary | ICD-10-CM | POA: Diagnosis not present

## 2020-01-15 DIAGNOSIS — Z7982 Long term (current) use of aspirin: Secondary | ICD-10-CM | POA: Diagnosis not present

## 2020-01-15 DIAGNOSIS — I252 Old myocardial infarction: Secondary | ICD-10-CM | POA: Diagnosis not present

## 2020-01-15 DIAGNOSIS — E785 Hyperlipidemia, unspecified: Secondary | ICD-10-CM | POA: Diagnosis not present

## 2020-01-15 DIAGNOSIS — R1084 Generalized abdominal pain: Secondary | ICD-10-CM

## 2020-01-15 DIAGNOSIS — Z6841 Body Mass Index (BMI) 40.0 and over, adult: Secondary | ICD-10-CM | POA: Diagnosis not present

## 2020-01-15 DIAGNOSIS — I2510 Atherosclerotic heart disease of native coronary artery without angina pectoris: Secondary | ICD-10-CM | POA: Insufficient documentation

## 2020-01-15 DIAGNOSIS — I5022 Chronic systolic (congestive) heart failure: Secondary | ICD-10-CM | POA: Diagnosis not present

## 2020-01-15 DIAGNOSIS — R634 Abnormal weight loss: Secondary | ICD-10-CM | POA: Diagnosis not present

## 2020-01-15 LAB — COMPREHENSIVE METABOLIC PANEL
ALT: 30 U/L (ref 0–44)
AST: 34 U/L (ref 15–41)
Albumin: 3.2 g/dL — ABNORMAL LOW (ref 3.5–5.0)
Alkaline Phosphatase: 73 U/L (ref 38–126)
Anion gap: 12 (ref 5–15)
BUN: 12 mg/dL (ref 8–23)
CO2: 19 mmol/L — ABNORMAL LOW (ref 22–32)
Calcium: 8.2 mg/dL — ABNORMAL LOW (ref 8.9–10.3)
Chloride: 101 mmol/L (ref 98–111)
Creatinine, Ser: 0.68 mg/dL (ref 0.44–1.00)
GFR calc Af Amer: >60 mL/min (ref >60)
GFR calc non Af Amer: >60 mL/min (ref >60)
Glucose, Bld: 279 mg/dL — ABNORMAL HIGH (ref 70–99)
Potassium: 3.8 mmol/L (ref 3.5–5.1)
Sodium: 132 mmol/L — ABNORMAL LOW (ref 135–145)
Total Bilirubin: 0.9 mg/dL (ref 0.3–1.2)
Total Protein: 7.8 g/dL (ref 6.5–8.1)

## 2020-01-15 LAB — CBC WITH DIFFERENTIAL/PLATELET
Abs Immature Granulocytes: 0.02 10*3/uL (ref 0.00–0.07)
Basophils Absolute: 0 10*3/uL (ref 0.0–0.1)
Basophils Relative: 0 %
Eosinophils Absolute: 0.1 10*3/uL (ref 0.0–0.5)
Eosinophils Relative: 1 %
HCT: 44.9 % (ref 36.0–46.0)
Hemoglobin: 14.9 g/dL (ref 12.0–15.0)
Immature Granulocytes: 0 %
Lymphocytes Relative: 31 %
Lymphs Abs: 1.6 10*3/uL (ref 0.7–4.0)
MCH: 28.1 pg (ref 26.0–34.0)
MCHC: 33.2 g/dL (ref 30.0–36.0)
MCV: 84.6 fL (ref 80.0–100.0)
Monocytes Absolute: 0.5 10*3/uL (ref 0.1–1.0)
Monocytes Relative: 10 %
Neutro Abs: 3 10*3/uL (ref 1.7–7.7)
Neutrophils Relative %: 58 %
Platelets: 296 10*3/uL (ref 150–400)
RBC: 5.31 MIL/uL — ABNORMAL HIGH (ref 3.87–5.11)
RDW: 13.1 % (ref 11.5–15.5)
WBC: 5.2 10*3/uL (ref 4.0–10.5)
nRBC: 0 % (ref 0.0–0.2)

## 2020-01-15 MED ORDER — LACTINEX PO CHEW: 1.0000 | CHEWABLE_TABLET | Freq: Three times a day (TID) | ORAL | 0 refills | Status: AC

## 2020-01-15 MED ORDER — DIPHENOXYLATE-ATROPINE 2.5-0.025 MG PO TABS
1.0000 | ORAL_TABLET | Freq: Four times a day (QID) | ORAL | 0 refills | Status: DC | PRN
Start: 2020-01-15 — End: 2022-04-10

## 2020-01-15 MED FILL — DIPHENOXYLATE-ATROPINE 2.5-: 2.5-0.025 | 8 days supply | Qty: 30 | Fill #0

## 2020-01-15 NOTE — ED Provider Notes (Signed)
[place]    CSN: [id number] Arrival date & time: 01/15/20  [id number]      History   Chief Complaint Chief Complaint  Patient presents with  . Diarrhea    HPI Terri Heath is a 64 y.o. female with a history of diabetes mellitus type 2 comes to urgent care with 4-day history of nonbloody nonmucoid diarrhea.  Symptoms started insidiously and has been persistent.  The diarrhea is watery with no blood.  She has had diarrhea about 3-4 times a day.  She tried using Lomotil with no improvement.  She has generalized abdominal crampy pain.  She admits having some loss of appetite, increasing fatigue and had subjective fever on the first day.  She has nasal allergies and hence she has some nasal congestion.  Her husband had a bout of diarrhea sometime last week.  She is fully vaccinated against COVID-19.   She denies any shortness of breath, chest pain or chest pressure.  Her blood sugars this morning was 280.  No changes to her diabetes medications.  HPI  Past Medical History:  Diagnosis Date  . Allergy   . Asthma   . Bilateral shoulder pain   . Borderline hypertension   . Bronchitis   . CAD (coronary artery disease)   . Chronic systolic CHF (congestive heart failure) ([place])   . Diabetes mellitus    Diagnosed in 2000  . History of MI (myocardial infarction)   . Hyperlipidemia   . Hypertension   . Hypothyroidism   . MGUS (monoclonal gammopathy of unknown significance)   . Mitral regurgitation   . Obesity   . Sleep apnea   . Statin intolerance     Patient Active Problem List   Diagnosis Date Noted  . Chronic systolic congestive heart failure, NYHA class 3 ([place]) 07/24/2018  . Chronic systolic congestive heart failure ([place]) 07/24/2018  . Acquired hypothyroidism 07/24/2018  . Cardiomyopathy, ischemic 07/24/2018  . MGUS (monoclonal gammopathy of unknown significance) 03/06/2018  . Acute systolic heart failure ([place])   . Dyspnea 01/14/2018  . Gangrenous appendicitis  with perforation s/p lap appendectomy 10/04/2017 10/04/2017  . Allergic rhinitis 06/11/2014  . Depression 09/15/2013  . Asthma due to seasonal allergies 10/24/2012  . OSA (obstructive sleep apnea) 05/23/2011  . Poorly controlled type 2 diabetes mellitus with circulatory disorder ([place]) 03/26/2007  . Hyperlipidemia 03/26/2007  . Morbid obesity, weight 368, BMI 57.7. 03/26/2007  . Essential hypertension 03/26/2007    Past Surgical History:  Procedure Laterality Date  . BREATH TEK H PYLORI  09/18/2011   Procedure: BREATH TEK H PYLORI;  Surgeon: Shann Medal, MD;  Location: [facility];  Service: General;  Laterality: N/A;  . CESAREAN SECTION     x 3  . LAPAROSCOPIC APPENDECTOMY N/A 10/03/2017   Procedure: APPENDECTOMY LAPAROSCOPIC;  Surgeon: Leighton Ruff, MD;  Location: [facility];  Service: General;  Laterality: N/A;  . RIGHT/LEFT HEART CATH AND CORONARY ANGIOGRAPHY N/A 02/12/2018   Procedure: RIGHT/LEFT HEART CATH AND CORONARY ANGIOGRAPHY;  Surgeon: Jettie Booze, MD;  Location: [facility];  Service: Cardiovascular;  Laterality: N/A;  . TUBAL LIGATION      OB History    Gravida  3   Para  3   Term      Preterm      AB      Living        SAB      TAB      Ectopic  Multiple      Live Births               Home Medications    Prior to Admission medications   Medication Sig Start Date End Date Taking? Authorizing Provider  albuterol (PROAIR HFA) 108 (90 Base) MCG/ACT inhaler Inhale 2 puffs into the lungs every 6 (six) hours as needed. wheezing 02/21/19   Rigoberto Noel, MD  aspirin 81 MG chewable tablet TAKE 1 TABLET BY MOUTH DAILY. 10/14/19   Larey Dresser, MD  bismuth subsalicylate (PEPTO BISMOL) 262 MG chewable tablet Chew 524 mg by mouth as needed for indigestion or diarrhea or loose stools.     [provider]  carvedilol (COREG) 12.5 MG tablet TAKE 1 TABLET BY MOUTH 2 TIMES DAILY. 05/13/19   Larey Dresser, MD  cetirizine (ZYRTEC)  10 MG tablet Take 10 mg by mouth as needed.     [provider]  diphenoxylate-atropine (LOMOTIL) 2.5-0.025 MG tablet Take 1 tablet by mouth 4 (four) times daily as needed for diarrhea or loose stools. 01/15/20   Damoni Causby, Myrene Galas, MD  Dulaglutide (TRULICITY) 1.5 0000000 SOPN Inject 1.5 mg into the skin once a week. 10/02/19   Philemon Kingdom, MD  Evolocumab (REPATHA SURECLICK) XX123456 MG/ML SOAJ Inject 1 pen into the skin every 14 (fourteen) days. 10/01/19   Larey Dresser, MD  fluticasone [facility]) 50 MCG/ACT nasal spray Place 2 sprays into both nostrils daily as needed for allergies or rhinitis.    [provider]  furosemide (LASIX) 40 MG tablet Take 1 tablet (40 mg total) by mouth every morning AND 0.5 tablets (20 mg total) every evening. 09/04/19   Larey Dresser, MD  glucose blood (FREESTYLE LITE) test strip Use as instructed 2x a day 10/02/19   Philemon Kingdom, MD  glucose blood test strip  10/02/19   [provider]  Insulin Degludec (TRESIBA FLEXTOUCH) 200 UNIT/ML SOPN Inject 40 Units into the skin daily. 10/02/19   Philemon Kingdom, MD  Insulin Pen Needle 32G X 4 MM MISC Use 1x a day 10/02/19   Philemon Kingdom, MD  lactobacillus acidophilus & bulgar (LACTINEX) chewable tablet Chew 1 tablet by mouth 3 (three) times daily with meals. 01/15/20 02/14/20  Chase Picket, MD  levothyroxine (SYNTHROID) 50 MCG tablet TAKE 1 TABLET BY MOUTH DAILY BEFORE BREAKFAST. 12/31/19   Jettie Booze, MD  magnesium oxide (MAG-OX) 400 MG tablet Take 400 mg by mouth at bedtime as needed (for cramps).    [provider]  metFORMIN (GLUCOPHAGE) 1000 MG tablet TAKE 1 TABLET (1,000 MG TOTAL) BY MOUTH TWICE A DAY WITH A MEAL. 10/14/19   Philemon Kingdom, MD  Multiple Vitamin (MULITIVITAMIN WITH MINERALS) TABS Take 1 tablet by mouth daily with breakfast.    [provider]  sacubitril-valsartan (ENTRESTO) 49-51 MG Take 1 tablet by mouth 2 (two) times daily. 10/15/19   Larey Dresser, MD  spironolactone (ALDACTONE) 25 MG tablet Take 1 tablet (25 mg total) by mouth daily. 08/05/19 11/03/19  Larey Dresser, MD    Family History Family History  Problem Relation Age of Onset  . Alcohol abuse Mother   . Arthritis Mother   . Diabetes Mother   . Sleep apnea Mother   . Anxiety disorder Mother   . Eating disorder Mother   . Obesity Mother   . Alcohol abuse Father   . Heart disease Father   . Hypertension Father   . Stroke Father   .  Cancer Father   . Sleep apnea Sister   . Breast cancer Neg Hx     Social History Social History   Tobacco Use  . Smoking status: Former Smoker    Packs/day: 1.00    Years: 15.00    Pack years: 15.00    Types: Cigarettes    Quit date: 01/28/1990    Years since quitting: 29.9  . Smokeless tobacco: Never Used  Substance Use Topics  . Alcohol use: No  . Drug use: No     Allergies   Amoxicillin, Adhesive [tape], Exenatide, Invokana [canagliflozin], Jardiance [empagliflozin], Lisinopril, Other, and Statins   Review of Systems Review of Systems  Constitutional: Positive for activity change, fatigue and fever. Negative for unexpected weight change.  HENT: Positive for congestion. Negative for sore throat.   Eyes: Negative.   Respiratory: Negative.   Cardiovascular: Negative.   Gastrointestinal: Positive for abdominal pain, diarrhea and nausea. Negative for vomiting.  Genitourinary: Negative.   Musculoskeletal: Negative.   Neurological: Positive for light-headedness. Negative for dizziness, weakness and headaches.  Psychiatric/Behavioral: Negative for confusion and decreased concentration.     Physical Exam Triage Vital Signs ED Triage Vitals  Enc Vitals Group     BP 01/15/20 1125 131/81     Pulse Rate 01/15/20 1125 98     Resp 01/15/20 1125 20     Temp 01/15/20 1125 97.6 F (36.4 C)     Temp Source 01/15/20 1125 Oral     SpO2 01/15/20 1125 97 %     Weight 01/15/20 1123 297 lb (134.7 kg)     Height --       Head Circumference --      Peak Flow --      Pain Score 01/15/20 1123 0     Pain Loc --      Pain Edu? --      Excl. in [place]? --    No data found.  Updated Vital Signs BP 131/81 (BP Location: Right Arm)   Pulse 98   Temp 97.6 F (36.4 C) (Oral)   Resp 20   Wt 134.7 kg   SpO2 97%   BMI 46.52 kg/m   Visual Acuity Right Eye Distance:   Left Eye Distance:   Bilateral Distance:    Right Eye Near:   Left Eye Near:    Bilateral Near:     Physical Exam   UC Treatments / Results  Labs (all labs ordered are listed, but only abnormal results are displayed) Labs Reviewed  SARS CORONAVIRUS 2 (TAT 6-24 HRS)  CBC WITH DIFFERENTIAL/PLATELET  COMPREHENSIVE METABOLIC PANEL    EKG   Radiology No results found.  Procedures Procedures (including critical care time)  Medications Ordered in UC Medications - No data to display  Initial Impression / Assessment and Plan / UC Course  I have reviewed the triage vital signs and the nursing notes.  Pertinent labs & imaging results that were available during my care of the patient were reviewed by me and considered in my medical decision making (see chart for details).     1.  Acute diarrhea: CBC, CMP, COVID-19 PCR test sent Lomotil as needed for diarrhea Return precautions given If the lab work shows some abnormality, we will call the patient with those results Treatment plan will be updated based on the blood work Patient is advised to increase oral fluid intake.  If symptoms worsen patient is advised to come to the urgent care for reevaluation. Final Clinical Impressions(s) /  UC Diagnoses   Final diagnoses:  Diarrhea, unspecified type   Discharge Instructions   None    ED Prescriptions    Medication Sig Dispense Auth. Provider   diphenoxylate-atropine (LOMOTIL) 2.5-0.025 MG tablet Take 1 tablet by mouth 4 (four) times daily as needed for diarrhea or loose stools. 30 tablet Sage Kopera, Myrene Galas, MD   lactobacillus  acidophilus & bulgar (LACTINEX) chewable tablet Chew 1 tablet by mouth 3 (three) times daily with meals. 90 tablet Krishna Dancel, Myrene Galas, MD     PDMP not reviewed this encounter.   Chase Picket, MD 01/15/20 [phone]

## 2020-01-15 NOTE — ED Triage Notes (Addendum)
Pt is here with diarrhea that started Monday night, pt has taken Pepto, Lomotil to relieve discomfort.

## 2020-01-15 NOTE — ED Provider Notes (Signed)
[place]   [id number] 01/15/20 Arrival Time: [id number]  CC: ABDOMINAL PAIN  SUBJECTIVE:  Patient requested video visit via [place]. This NP, Faustino Congress was located in a [facility] facility and the patient was located in her home. Discussed with patient that we may not be able to address all concerns via video and she may need to come in and be evaluated in person. She requests to continue with the visit.  Terri Heath is a 64 y.o. female who presents with complaint of abdominal discomfort and watery diarrhea for the last 4 days. Denies a precipitating event, trauma, close contacts with similar symptoms, recent travel or antibiotic use. Reports generalized abdominal pain just before she has to have bm. Describes as intermittent and crampy in character. Has tried loperamide and diphenoxylate with atropine without relief. Also reports that her blood sugars were increased to 230s-300s.    Denies fever, chills, appetite changes, weight changes, nausea, vomiting, chest pain, SOB,  constipation, hematochezia, melena, dysuria, difficulty urinating, increased frequency or urgency, flank pain, loss of bowel or bladder function, vaginal discharge, vaginal odor, vaginal bleeding, dyspareunia, pelvic pain.     No LMP recorded. Patient is postmenopausal.  ROS: As per HPI.  All other pertinent ROS negative.     Past Medical History:  Diagnosis Date  . Allergy   . Asthma   . Bilateral shoulder pain   . Borderline hypertension   . Bronchitis   . CAD (coronary artery disease)   . Chronic systolic CHF (congestive heart failure) ([place])   . Diabetes mellitus    Diagnosed in 2000  . History of MI (myocardial infarction)   . Hyperlipidemia   . Hypertension   . Hypothyroidism   . MGUS (monoclonal gammopathy of unknown significance)   . Mitral regurgitation   . Obesity   . Sleep apnea   . Statin intolerance    Past Surgical History:  Procedure Laterality Date  .  BREATH TEK H PYLORI  09/18/2011   Procedure: BREATH TEK H PYLORI;  Surgeon: Shann Medal, MD;  Location: [facility];  Service: General;  Laterality: N/A;  . CESAREAN SECTION     x 3  . LAPAROSCOPIC APPENDECTOMY N/A 10/03/2017   Procedure: APPENDECTOMY LAPAROSCOPIC;  Surgeon: Leighton Ruff, MD;  Location: [facility];  Service: General;  Laterality: N/A;  . RIGHT/LEFT HEART CATH AND CORONARY ANGIOGRAPHY N/A 02/12/2018   Procedure: RIGHT/LEFT HEART CATH AND CORONARY ANGIOGRAPHY;  Surgeon: Jettie Booze, MD;  Location: [facility];  Service: Cardiovascular;  Laterality: N/A;  . TUBAL LIGATION     Allergies  Allergen Reactions  . Amoxicillin Other (See Comments)    Dizziness, abdominal pain  . Adhesive [Tape] Rash  . Exenatide Other (See Comments)    Flu-like symptoms  . Invokana [Canagliflozin] Other (See Comments)    Yeast infection/dehydration  . Jardiance [Empagliflozin] Other (See Comments)    Yeast infections and dehydration  . Lisinopril Other (See Comments)    cramping  . Other Diarrhea and Other (See Comments)    Kale=food  Per patient, it causes GI bleeding   . Statins Other (See Comments)    Leg cramps   No current facility-administered medications on file prior to encounter.   Current Outpatient Medications on File Prior to Encounter  Medication Sig Dispense Refill  . albuterol (PROAIR HFA) 108 (90 Base) MCG/ACT inhaler Inhale 2 puffs into the lungs every 6 (six) hours as needed. wheezing 18 g 2  .  aspirin 81 MG chewable tablet TAKE 1 TABLET BY MOUTH DAILY. 90 tablet 3  . bismuth subsalicylate (PEPTO BISMOL) 262 MG chewable tablet Chew 524 mg by mouth as needed for indigestion or diarrhea or loose stools.     . carvedilol (COREG) 12.5 MG tablet TAKE 1 TABLET BY MOUTH 2 TIMES DAILY. 180 tablet 3  . cetirizine (ZYRTEC) 10 MG tablet Take 10 mg by mouth as needed.     . Dulaglutide (TRULICITY) 1.5 0000000 SOPN Inject 1.5 mg into the skin once a week. 4 pen 11  .  Evolocumab (REPATHA SURECLICK) XX123456 MG/ML SOAJ Inject 1 pen into the skin every 14 (fourteen) days. 2 pen 11  . fluticasone (FLONASE) 50 MCG/ACT nasal spray Place 2 sprays into both nostrils daily as needed for allergies or rhinitis.    . furosemide (LASIX) 40 MG tablet Take 1 tablet (40 mg total) by mouth every morning AND 0.5 tablets (20 mg total) every evening. 45 tablet 5  . glucose blood (FREESTYLE LITE) test strip Use as instructed 2x a day 200 each 3  . Insulin Degludec (TRESIBA FLEXTOUCH) 200 UNIT/ML SOPN Inject 40 Units into the skin daily. 6 pen 3  . Insulin Pen Needle 32G X 4 MM MISC Use 1x a day 100 each 3  . levothyroxine (SYNTHROID) 50 MCG tablet TAKE 1 TABLET BY MOUTH DAILY BEFORE BREAKFAST. 90 tablet 0  . magnesium oxide (MAG-OX) 400 MG tablet Take 400 mg by mouth at bedtime as needed (for cramps).    . metFORMIN (GLUCOPHAGE) 1000 MG tablet TAKE 1 TABLET (1,000 MG TOTAL) BY MOUTH TWICE A DAY WITH A MEAL. 180 tablet 1  . Multiple Vitamin (MULITIVITAMIN WITH MINERALS) TABS Take 1 tablet by mouth daily with breakfast.    . sacubitril-valsartan (ENTRESTO) 49-51 MG Take 1 tablet by mouth 2 (two) times daily. 60 tablet 3  . spironolactone (ALDACTONE) 25 MG tablet Take 1 tablet (25 mg total) by mouth daily. 90 tablet 3   Social History   Socioeconomic History  . Marital status: Married    Spouse name: Not on file  . Number of children: Not on file  . Years of education: Not on file  . Highest education level: Not on file  Occupational History  . Not on file  Tobacco Use  . Smoking status: Former Smoker    Packs/day: 1.00    Years: 15.00    Pack years: 15.00    Types: Cigarettes    Quit date: 01/28/1990    Years since quitting: 29.9  . Smokeless tobacco: Never Used  Substance and Sexual Activity  . Alcohol use: No  . Drug use: No  . Sexual activity: Not Currently  Other Topics Concern  . 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 Difficulty of Paying Living Expenses:   Food Insecurity:   . Worried About Charity fundraiser in the Last Year:   . Arboriculturist in the Last Year:   Transportation Needs:   . Film/video editor (Medical):   Marland Kitchen Lack of Transportation (Non-Medical):   Physical Activity:   . Days of Exercise per Week:   . Minutes of Exercise per Session:   Stress:   . Feeling of Stress :   Social Connections:   . Frequency of Communication with Friends and  Family:   . Frequency of Social Gatherings with Friends and Family:   . Attends Religious Services:   . Active Member of Clubs or Organizations:   . Attends Archivist Meetings:   Marland Kitchen Marital Status:   Intimate Partner Violence:   . Fear of Current or Ex-Partner:   . Emotionally Abused:   Marland Kitchen Physically Abused:   . Sexually Abused:    Family History  Problem Relation Age of Onset  . Alcohol abuse Mother   . Arthritis Mother   . Diabetes Mother   . Sleep apnea Mother   . Anxiety disorder Mother   . Eating disorder Mother   . Obesity Mother   . Alcohol abuse Father   . Heart disease Father   . Hypertension Father   . Stroke Father   . Cancer Father   . Sleep apnea Sister   . Breast cancer Neg Hx      OBJECTIVE:  There were no vitals filed for this visit.  General appearance: Alert; NAD HEENT: NCAT Lungs: speaking in full sentences Skin: no obvious rashes Psychological: alert and cooperative; normal mood and affect  LABS: No results found for this or any previous visit (from the past 24 hour(s)).  DIAGNOSTIC STUDIES: No results found.   ASSESSMENT & PLAN:  1. Diarrhea, unspecified type   2. Generalized abdominal pain   3. Hyperglycemia   4. Loss of weight     No orders of the defined types were placed in this encounter.    Go to UC in [place] to be further evaluated in person.  Discussed possibility of DKA  and possible need to be evaluated in the ER.   If you experience new or worsening symptoms return or go to ER such as fever, chills, nausea, vomiting, bloody or dark tarry stools, constipation, urinary symptoms, worsening abdominal discomfort, symptoms that do not improve with medications, inability to keep fluids down.  Reviewed expectations re: course of current medical issues. Questions answered. Outlined signs and symptoms indicating need for more acute intervention. Patient verbalized understanding. After Visit Summary given  I spent about 15 minutes on this encounter non face to face time.    Faustino Congress, NP 01/15/20 [phone]

## 2020-01-15 NOTE — Discharge Instructions (Addendum)
I would like you to go to Urgent Care in person for further evaluation and treatment.   If your labs are abnormal, you may need to go to the ER.

## 2020-01-16 LAB — SARS CORONAVIRUS 2 (TAT 6-24 HRS): SARS Coronavirus 2: NEGATIVE

## 2020-01-30 MED FILL — TRULICITY 1.5 MG/0.5 ML PEN: 1.5 | 28 days supply | Qty: 2 | Fill #4

## 2020-01-30 MED FILL — REPATHA SURECLICK 140 MG/ML: 140 | 28 days supply | Qty: 2 | Fill #4

## 2020-02-09 MED FILL — CARVEDILOL 12.5 MG TABLET: 12.5 | 90 days supply | Qty: 180 | Fill #3

## 2020-02-09 MED FILL — ENTRESTO 49 MG-51 MG TABLET: 49-51 | 30 days supply | Qty: 60 | Fill #1

## 2020-02-20 DIAGNOSIS — G4733 Obstructive sleep apnea (adult) (pediatric): Secondary | ICD-10-CM | POA: Diagnosis not present

## 2020-02-26 MED FILL — TRULICITY 1.5 MG/0.5 ML PEN: 1.5 | 28 days supply | Qty: 2 | Fill #5

## 2020-02-26 MED FILL — REPATHA SURECLICK 140 MG/ML: 140 | 28 days supply | Qty: 2 | Fill #5

## 2020-03-11 DIAGNOSIS — H5213 Myopia, bilateral: Secondary | ICD-10-CM | POA: Diagnosis not present

## 2020-03-11 DIAGNOSIS — H524 Presbyopia: Secondary | ICD-10-CM | POA: Diagnosis not present

## 2020-03-11 DIAGNOSIS — H52223 Regular astigmatism, bilateral: Secondary | ICD-10-CM | POA: Diagnosis not present

## 2020-03-15 ENCOUNTER — Other Ambulatory Visit (HOSPITAL_COMMUNITY): Payer: Self-pay | Admitting: Cardiology

## 2020-03-15 MED FILL — ENTRESTO 49 MG-51 MG TABLET: 49-51 | 30 days supply | Qty: 60 | Fill #0

## 2020-03-29 MED FILL — FUROSEMIDE 40 MG TAB: 40 | 90 days supply | Qty: 180 | Fill #3

## 2020-03-29 MED FILL — REPATHA SURECLICK 140 MG/ML: 140 | 28 days supply | Qty: 2 | Fill #6

## 2020-03-29 MED FILL — TRULICITY 1.5 MG/0.5 ML PEN: 1.5 | 28 days supply | Qty: 2 | Fill #6

## 2020-04-12 ENCOUNTER — Other Ambulatory Visit: Payer: Self-pay | Admitting: Interventional Cardiology

## 2020-04-13 MED FILL — LEVOTHYROXINE 50 MCG TABLET: 50 | 90 days supply | Qty: 90 | Fill #0

## 2020-04-13 NOTE — Telephone Encounter (Signed)
Refills sent in to last until appointment. Future refills to be done by PCP.

## 2020-04-13 NOTE — Telephone Encounter (Signed)
Pt's pharmacy is requesting a refill on levothyroxine. Would Dr. Irish Lack like to refill this medication? Please address

## 2020-04-13 NOTE — Telephone Encounter (Signed)
Follow up:    Patient calling to check the status of her refill. Patient has appt

## 2020-04-19 ENCOUNTER — Other Ambulatory Visit: Payer: Self-pay | Admitting: Internal Medicine

## 2020-04-19 DIAGNOSIS — E119 Type 2 diabetes mellitus without complications: Secondary | ICD-10-CM

## 2020-04-19 MED FILL — metFORMIN HCL 1000 MG TABS: 1000 | 90 days supply | Qty: 180 | Fill #0

## 2020-04-19 MED FILL — ENTRESTO 49 MG-51 MG TABLET: 49-51 | 30 days supply | Qty: 60 | Fill #1

## 2020-04-22 ENCOUNTER — Telehealth (HOSPITAL_COMMUNITY): Payer: Self-pay | Admitting: Internal Medicine

## 2020-04-22 NOTE — Telephone Encounter (Signed)
appt changed to virtual Detailed message left on voicemail

## 2020-04-22 NOTE — Telephone Encounter (Signed)
Pt has an appt scheduled Monday @8 :30 w/ NP, she wants to know if she can change it to virtual, she just started a new job. Please advise

## 2020-04-22 NOTE — Telephone Encounter (Signed)
Error

## 2020-04-26 ENCOUNTER — Encounter (HOSPITAL_COMMUNITY): Payer: Self-pay | Admitting: Cardiology

## 2020-04-26 ENCOUNTER — Other Ambulatory Visit: Payer: Self-pay

## 2020-04-26 ENCOUNTER — Ambulatory Visit (HOSPITAL_COMMUNITY)
Admission: RE | Admit: 2020-04-26 | Discharge: 2020-04-26 | Disposition: A | Discharge status: Home / Self Care | Payer: 59 | Source: Ambulatory Visit | Attending: Cardiology | Admitting: Cardiology

## 2020-04-26 ENCOUNTER — Encounter (HOSPITAL_COMMUNITY): Payer: Self-pay

## 2020-04-26 VITALS — BP 127/74 | HR 88 | Wt 297.0 lb

## 2020-04-26 DIAGNOSIS — I5022 Chronic systolic (congestive) heart failure: Secondary | ICD-10-CM | POA: Diagnosis not present

## 2020-04-26 NOTE — Patient Instructions (Addendum)
It was great to speak with you today! No medication changes are needed at this time.  Labs are needed within 7-14 days (05/04/2020 @ 1130 at [facility])  Your physician recommends that you schedule a follow-up appointment in: 4 months with Dr Aundra Dubin (08/25/2020 @ 11:40am)  Do the following things EVERYDAY: 1) Weigh yourself in the morning before breakfast. Write it down and keep it in a log. 2) Take your medicines as prescribed 3) Eat low salt foods--Limit salt (sodium) to 2000 mg per day.  4) Stay as active as you can everyday 5) Limit all fluids for the day to less than 2 liters  If you have any questions or concerns before your next appointment please send [place] a message through [place] or call our office at [phone].    TO LEAVE A MESSAGE FOR THE NURSE SELECT OPTION 2, PLEASE LEAVE A MESSAGE INCLUDING: . YOUR NAME . DATE OF BIRTH . CALL BACK NUMBER . REASON FOR CALL**this is important as we prioritize the call backs  YOU WILL RECEIVE A CALL BACK THE SAME DAY AS LONG AS YOU CALL BEFORE 4:00 PM

## 2020-04-26 NOTE — Progress Notes (Addendum)
Heart Failure TeleHealth Note  Due to national recommendations of social distancing due to [place] 19, Audio/video telehealth visit is felt to be most appropriate for this patient at this time.  See MyChart message from today for patient consent regarding telehealth for [facility].  Date:  04/26/2020   ID:  Terri Heath, DOB 1955/12/10, MRN [id number]  Location: Home  Provider location: [place] [facility] Type of Visit: Established patient   PCP:  [name], MD  Cardiologist:  Larae Grooms, MD Primary HF: Dr. Aundra Dubin   Chief Complaint: No Chief Complaint Follow-up for chronic systolic heart failure   History of Present Illness: Terri Heath is a 64 y.o. female with a history of chronic systolic heart failure.  She  presents via Engineer, civil (consulting) for a telehealth visit today.     She denies symptoms worrisome for COVID 19.  She completed Covid vaccination January 2021  She is a 64 y.o. with history of chronic systolic CHF, CAD, and MGUS was initially referred by Dr. Irish Lack for evaluation of CHF.  Patient first developed exertional dyspnea after her appendectomy in 2/19.  By 5/19, she was gaining weight and had developed a chronic cough. She never had chest pain.  Echo was done in 5/19 showing EF 25-30%, moderate MR, moderate RV dilation.  RHC/LHC was done in 6/19 showing elevated filling pressures, preserved cardiac output, and occluded mid LCx and distal RCA.  She was found to have IgM monoclonal antibody.  Skeletal survey showed no lytic lesions, likely MGUS.  Cardiac MRI in 8/19 showed EF 32%, subendocardial scar (consistent with prior MI) at inferior base, moderately dilated RV with RV EF 20%. No evidence for cardiac amyloidosis by MRI.  Repeat echo in 10/19 showed EF remaining 30-35%.  She was seen by Dr. Rayann Heman and declined ICD.   Echo in 10/20 showed EF up to 40-45%.   Last clinic visit was January 2021 with Dr. Aundra Dubin.  She was  doing well at that time without any significant symptoms.  [name] was increased to 97- 103 twice daily however she did not tolerate higher dose due to dizziness.  Dose was reduced back down to 49- 51 twice daily and she has done well with this.  Today in follow-up, she reports that she continues to do well from a cardiac standpoint.  She denies any exertional dyspnea.  Very active around her house with household chores and yard work.  Also denies weight gain.  Weight has been stable at home around 297 pounds.  Blood pressure well controlled, 127/74.  Pulse rate 88 bpm.  She also denies orthopnea and PND.  No lower extremity edema.  She denies any recent anginal symptomatology.  She reports full compliance with her medications and tolerating well without any side effects.    Past Medical History:  Diagnosis Date  . Allergy   . Asthma   . Bilateral shoulder pain   . Borderline hypertension   . Bronchitis   . CAD (coronary artery disease)   . Chronic systolic CHF (congestive heart failure) ([place])   . Diabetes mellitus    Diagnosed in 2000  . History of MI (myocardial infarction)   . Hyperlipidemia   . Hypertension   . Hypothyroidism   . MGUS (monoclonal gammopathy of unknown significance)   . Mitral regurgitation   . Obesity   . Sleep apnea   . Statin intolerance    Past Surgical History:  Procedure Laterality Date  .  BREATH TEK H PYLORI  09/18/2011   Procedure: BREATH TEK H PYLORI;  Surgeon: Shann Medal, MD;  Location: [facility];  Service: General;  Laterality: N/A;  . CESAREAN SECTION     x 3  . LAPAROSCOPIC APPENDECTOMY N/A 10/03/2017   Procedure: APPENDECTOMY LAPAROSCOPIC;  Surgeon: Leighton Ruff, MD;  Location: [facility];  Service: General;  Laterality: N/A;  . RIGHT/LEFT HEART CATH AND CORONARY ANGIOGRAPHY N/A 02/12/2018   Procedure: RIGHT/LEFT HEART CATH AND CORONARY ANGIOGRAPHY;  Surgeon: Jettie Booze, MD;  Location: [facility];  Service: Cardiovascular;   Laterality: N/A;  . TUBAL LIGATION       Current Outpatient Medications  Medication Sig Dispense Refill  . albuterol (PROAIR HFA) 108 (90 Base) MCG/ACT inhaler Inhale 2 puffs into the lungs every 6 (six) hours as needed. wheezing 18 g 2  . aspirin 81 MG chewable tablet TAKE 1 TABLET BY MOUTH DAILY. 90 tablet 3  . bismuth subsalicylate (PEPTO BISMOL) 262 MG chewable tablet Chew 524 mg by mouth as needed for indigestion or diarrhea or loose stools.     . carvedilol (COREG) 12.5 MG tablet TAKE 1 TABLET BY MOUTH 2 TIMES DAILY. 180 tablet 3  . cetirizine (ZYRTEC) 10 MG tablet Take 10 mg by mouth as needed.     . diphenoxylate-atropine (LOMOTIL) 2.5-0.025 MG tablet Take 1 tablet by mouth 4 (four) times daily as needed for diarrhea or loose stools. 30 tablet 0  . Dulaglutide (TRULICITY) 1.5 DD/2.2GU SOPN Inject 1.5 mg into the skin once a week. 4 pen 11  . ENTRESTO 49-51 MG TAKE 1 TABLET BY MOUTH 2 TIMES DAILY. 60 tablet 1  . Evolocumab (REPATHA SURECLICK) 542 MG/ML SOAJ Inject 1 pen into the skin every 14 (fourteen) days. 2 pen 11  . fluticasone (FLONASE) 50 MCG/ACT nasal spray Place 2 sprays into both nostrils daily as needed for allergies or rhinitis.    . furosemide (LASIX) 40 MG tablet Take 40 mg by mouth 2 (two) times daily.    Marland Kitchen glucose blood (FREESTYLE LITE) test strip Use as instructed 2x a day 200 each 3  . glucose blood test strip     . Insulin Degludec (TRESIBA FLEXTOUCH) 200 UNIT/ML SOPN Inject 40 Units into the skin daily. 6 pen 3  . Insulin Pen Needle 32G X 4 MM MISC Use 1x a day 100 each 3  . levothyroxine (SYNTHROID) 50 MCG tablet TAKE 1 TABLET BY MOUTH DAILY BEFORE BREAKFAST. 90 tablet 0  . magnesium oxide (MAG-OX) 400 MG tablet Take 400 mg by mouth at bedtime as needed (for cramps).    . metFORMIN (GLUCOPHAGE) 1000 MG tablet TAKE 1 TABLET (1,000 MG TOTAL) BY MOUTH TWICE A DAY WITH A MEAL. 180 tablet 1  . Multiple Vitamin (MULITIVITAMIN WITH MINERALS) TABS Take 1 tablet by mouth  daily with breakfast.    . spironolactone (ALDACTONE) 25 MG tablet Take 1 tablet (25 mg total) by mouth daily. 90 tablet 3   No current facility-administered medications for this encounter.    Allergies:   Amoxicillin, Adhesive [tape], Exenatide, Invokana [canagliflozin], Jardiance [empagliflozin], Lisinopril, Other, and Statins   Social History:  The patient  reports that she quit smoking about 30 years ago. Her smoking use included cigarettes. She has a 15.00 pack-year smoking history. She has never used smokeless tobacco. She reports that she does not drink alcohol and does not use drugs.   Family History:  The patient's family history includes Alcohol abuse in her  father and mother; Anxiety disorder in her mother; Arthritis in her mother; Cancer in her father; Diabetes in her mother; Eating disorder in her mother; Heart disease in her father; Hypertension in her father; Obesity in her mother; Sleep apnea in her mother and sister; Stroke in her father.   ROS:  Please see the history of present illness.   All other systems are personally reviewed and negative.   Exam:  (Video/Tele Health Call; Exam is subjective and or/visual.) General:  Speaks in full sentences. No resp difficulty. Lungs: Normal respiratory effort with conversation.  Abdomen: Non-distended per patient report Extremities: Pt denies edema. Neuro: Alert & oriented x 3.   Recent Labs: 01/15/2020: ALT 30; BUN 12; Creatinine, Ser 0.68; Hemoglobin 14.9; Platelets 296; Potassium 3.8; Sodium 132  Personally reviewed   Wt Readings from Last 3 Encounters:  04/26/20 134.7 kg (297 lb)  01/15/20 134.7 kg (297 lb)  10/02/19 (!) 137 kg (302 lb)      ASSESSMENT AND PLAN:    1. Chronic systolic CHF: Echo in 0/63 with EF 25-30%.  She had occluded mid LCx and distal RCA on coronary angiography.  Suspect ischemic cardiomyopathy.  She also has a monoclonal antibody.  Cardiac MRI in 8/19 showed LV EF 32% and RV EF 30%, subendocardial  scar at inferior base consistent with prior MI, no evidence for cardiac amyloidosis.  Repeat echo in 10/19 showed EF 30-35%.  Patient saw Dr. Rayann Heman and declined ICD.  However, echo in 10/20 showed EF up to 40-45% and out of ICD range.   -Patient reports doing well from a symptom/volume standpoint. NYHA Class II.  She reports her weight has been stable at home and she denies any signs and symptoms of fluid retention. - Continue Entresto 49-51 twice daily (she did not tolerate 97-103 dose due to dizziness)  - Continue spironolactone 25 mg daily.   - Continue Coreg 12.5 mg bid.    - Continue Lasix 40 mg twice daily - She has not wanted to take an SGLT2 inhibitor due to history of GU infections.  Also diabetes poorly controlled.  Most recent hemoglobin A1c 14 (would avoid SGLT2i anyways w/ hgb A1c >10) - Will arrange for BMP to check renal function and electrolytes  2. CAD: Occluded mLCx and dRCA.  Low EF somewhat out of proportion to this.  No interventional target.   - She denies anginal symptomatology - Continue ASA 81 daily.  - Continue beta-blocker therapy with carvedilol - Unable to tolerate statins.  She is on Repatha.  This is followed by lipid clinic.     COVID screen The patient does not have any symptoms that suggest any further testing/ screening at this time.  Social distancing reinforced today.  Patient Risk: After full review of this patients clinical status, I feel that they are at moderate risk for cardiac decompensation at this time.  Relevant cardiac medications were reviewed at length with the patient today. The patient does not have concerns regarding their medications at this time.   The following changes were made today: No changes made Recommended follow-up: Needs  BMP for medication monitoring.  Follow-up with Dr. Aundra Dubin in 4 months.  Today, I have spent 12 minutes with the patient with telehealth technology discussing the above issues .    Signed, Lyda Jester, PA-C  04/26/2020 10:34 AM  [place] [address] [phone] (office) [phone] (fax)

## 2020-04-29 ENCOUNTER — Other Ambulatory Visit (HOSPITAL_COMMUNITY): Payer: Self-pay | Admitting: Cardiology

## 2020-04-29 MED FILL — CARVEDILOL 12.5 MG TABLET: 12.5 | 90 days supply | Qty: 180 | Fill #0

## 2020-05-04 ENCOUNTER — Other Ambulatory Visit (HOSPITAL_COMMUNITY): Payer: Self-pay | Admitting: Cardiology

## 2020-05-04 ENCOUNTER — Telehealth: Payer: Self-pay | Admitting: Internal Medicine

## 2020-05-04 ENCOUNTER — Encounter (HOSPITAL_COMMUNITY): Payer: Self-pay

## 2020-05-04 ENCOUNTER — Other Ambulatory Visit (HOSPITAL_COMMUNITY): Payer: 59

## 2020-05-04 MED ORDER — REPATHA SURECLICK 140 MG/ML ~~LOC~~ SOAJ: 1.0000 "pen " | SUBCUTANEOUS | 11 refills | Status: DC

## 2020-05-04 MED ORDER — TRULICITY 1.5 MG/0.5ML ~~LOC~~ SOAJ: 1.5000 mg | SUBCUTANEOUS | 11 refills | Status: DC

## 2020-05-04 NOTE — Telephone Encounter (Signed)
Medication Refill Request  Did you call your pharmacy and request this refill first? Yes  . If patient has not contacted pharmacy first, instruct them to do so for future refills.  . Remind them that contacting the pharmacy for their refill is the quickest method to get the refill.  . Refill policy also stated that it will take anywhere between 24-72 hours to receive the refill.    Name of medication? trulicity  Name and location of pharmacy?  [place], [place] - [address] [facility].  [address] [place] [address]  Phone:  [phone] Fax:  [phone]

## 2020-05-04 NOTE — Telephone Encounter (Signed)
RX sent

## 2020-05-05 ENCOUNTER — Telehealth: Payer: Self-pay

## 2020-05-05 MED ORDER — REPATHA SURECLICK 140 MG/ML ~~LOC~~ SOAJ: 1.0000 "pen " | SUBCUTANEOUS | 11 refills | Status: DC

## 2020-05-05 MED FILL — REPATHA SURECLICK 140 MG/ML: 140 | 28 days supply | Qty: 2 | Fill #0

## 2020-05-05 NOTE — Addendum Note (Signed)
Addended by: Miyako Oelke E on: 05/05/2020 01:00 PM   Modules accepted: Orders

## 2020-05-05 NOTE — Telephone Encounter (Signed)
-----   Message from Leeroy Bock, [place] sent at 05/05/2020  9:04 AM EDT ----- [name], would you be able to reach out to this pt? She sent a message about needing a Repatha refill, we have PA approved through next year. Her pharmacy sent [place] a CMM key [id number] and it looks like she might have changed insurance to [place] so she may need another PA

## 2020-05-05 NOTE — Telephone Encounter (Signed)
Called and lmomed the pt to return our call asap

## 2020-05-05 NOTE — Telephone Encounter (Signed)
New rx for Repatha has been sent to pharmacy as HF clinic sent in rx for 237mL yesterday instead of 8mL. PA has been approved under new [employer] insurance through 11/01/20.

## 2020-05-06 ENCOUNTER — Encounter: Payer: Self-pay | Admitting: Internal Medicine

## 2020-05-07 ENCOUNTER — Telehealth: Payer: Self-pay

## 2020-05-07 MED ORDER — TRULICITY 1.5 MG/0.5ML ~~LOC~~ SOAJ: 1.5000 mg | SUBCUTANEOUS | 11 refills | Status: DC

## 2020-05-07 MED FILL — TRULICITY 1.5 MG/0.5 ML PEN: 1.5 | 28 days supply | Qty: 2 | Fill #0

## 2020-05-07 NOTE — Telephone Encounter (Signed)
Prior authorization for Trulicity has been approved by patient's insurance.  Coverage is effective 05/07/2020 to 08/27/2098  PA Approval/Reference Number [id number]  Approval letter has been sent to scanning.  Approvedtoday CaseId:[id number];Status:Approved;Review Type:Prior Auth;Coverage Start Date:05/07/2020;Coverage End Date:08/27/2098;

## 2020-05-19 ENCOUNTER — Ambulatory Visit (HOSPITAL_COMMUNITY)
Admission: RE | Admit: 2020-05-19 | Discharge: 2020-05-19 | Disposition: A | Discharge status: Home / Self Care | Payer: Managed Care, Other (non HMO) | Source: Ambulatory Visit | Attending: Internal Medicine | Admitting: Internal Medicine

## 2020-05-19 ENCOUNTER — Other Ambulatory Visit: Payer: Self-pay

## 2020-05-19 ENCOUNTER — Other Ambulatory Visit (HOSPITAL_COMMUNITY): Payer: Self-pay | Admitting: Cardiology

## 2020-05-19 DIAGNOSIS — I5022 Chronic systolic (congestive) heart failure: Secondary | ICD-10-CM | POA: Diagnosis not present

## 2020-05-19 LAB — BASIC METABOLIC PANEL
Anion gap: 12 (ref 5–15)
BUN: 13 mg/dL (ref 8–23)
CO2: 23 mmol/L (ref 22–32)
Calcium: 8.9 mg/dL (ref 8.9–10.3)
Chloride: 98 mmol/L (ref 98–111)
Creatinine, Ser: 0.81 mg/dL (ref 0.44–1.00)
GFR calc Af Amer: >60 mL/min (ref >60)
GFR calc non Af Amer: >60 mL/min (ref >60)
Glucose, Bld: 322 mg/dL — ABNORMAL HIGH (ref 70–99)
Potassium: 4.1 mmol/L (ref 3.5–5.1)
Sodium: 133 mmol/L — ABNORMAL LOW (ref 135–145)

## 2020-05-19 MED FILL — ENTRESTO 49 MG-51 MG TABLET: 49-51 | 30 days supply | Qty: 60 | Fill #0

## 2020-06-03 MED FILL — TRULICITY 1.5 MG/0.5 ML PEN: 1.5 | 28 days supply | Qty: 2 | Fill #1

## 2020-06-03 MED FILL — REPATHA SURECLICK 140 MG/ML: 140 | 28 days supply | Qty: 2 | Fill #1

## 2020-06-13 NOTE — Progress Notes (Signed)
Cardiology Office Note   Date:  06/14/2020   ID:  Terri Heath, DOB Mar 09, 1956, MRN [id number]  PCP:  [name], MD    No chief complaint on file.  Chronic systolic heart failure  Wt Readings from Last 3 Encounters:  06/14/20 298 lb 12.8 oz (135.5 kg)  06/14/20 298 lb 12.8 oz (135.5 kg)  04/26/20 297 lb (134.7 kg)       History of Present Illness: Terri Heath is a 64 y.o. female   Who was seen for [facility] in 5/19.  Echo revealed:  Left ventricle: The cavity size was mildly dilated. Wall thickness was normal. Systolic function was severely reduced. The estimated ejection fraction was in the range of 25% to 30%. Diffuse hypokinesis. Doppler parameters are consistent with restrictive physiology, indicative of decreased left ventricular diastolic compliance and/or increased left atrial pressure. Doppler parameters are consistent with high ventricular filling pressure. - Mitral valve: There was moderate regurgitation. - Left atrium: The atrium was mildly dilated. - Right ventricle: The cavity size was moderately dilated. - Right atrium: The atrium was mildly dilated. - Pulmonary arteries: Systolic pressure was moderately increased. PA peak pressure: 50 mm Hg (S).  Impressions:  - Definity used; global hypokinesis (worse in septum); overall severely reduced LV systolic function; restrictive filling; 4 chamber enlargement; moderate MR; mild TR with moderate pulmonary hypertension.   Cath in 2019 showed: "Dist RCA lesion is 100% stenosed. Left to right collaterals  Mid Cx lesion is 100% stenosed.  Ost 2nd Diag to 2nd Diag lesion is 25% stenosed.  Mid LAD lesion is 25% stenosed.  There is severe left ventricular systolic dysfunction.  LV end diastolic pressure is moderately elevated.  The left ventricular ejection fraction is less than 25% by visual estimate.  There is no aortic valve stenosis.  Hemodynamic findings  consistent with moderate pulmonary hypertension.  CO 5.5 L/min; CI 2.3; mean PA sat 42 mm Hg; mean PCWP 29 mm Hg; Ao sat 97%; PA sat 69%   CAD does not explain extent of cardiomyopathy.  Will continue medical therapy.  Will refer to heart failure clinic.    After a few days, will increase diuretic to 40 mg BID.  "   W/u revealed that she was hypothyroid and had MGUS.  Normal iron studies.  SHe was started on Lasix and Entresto.    A1C was 14.  Family history with father who has severe vascular disease.   She has had difficulty controlling DM.  SHe tries to avoid insulin since it causes weight gain.  She has not tolerated some of the diuretic type DM meds due to frequent urination.   She will likely not go back to Dr. Cruzita Lederer for endocrinology.    Denies : Chest pain. Dizziness. Leg edema. Nitroglycerin use. Orthopnea. Palpitations. Paroxysmal nocturnal dyspnea. Shortness of breath. Syncope.     Past Medical History:  Diagnosis Date  . Allergy   . Asthma   . Bilateral shoulder pain   . Borderline hypertension   . Bronchitis   . CAD (coronary artery disease)   . Chronic systolic CHF (congestive heart failure) ([place])   . Diabetes mellitus    Diagnosed in 2000  . History of MI (myocardial infarction)   . Hyperlipidemia   . Hypertension   . Hypothyroidism   . MGUS (monoclonal gammopathy of unknown significance)   . Mitral regurgitation   . Obesity   . Sleep apnea   . Statin intolerance  Past Surgical History:  Procedure Laterality Date  . BREATH TEK H PYLORI  09/18/2011   Procedure: BREATH TEK H PYLORI;  Surgeon: Shann Medal, MD;  Location: [facility];  Service: General;  Laterality: N/A;  . CESAREAN SECTION     x 3  . LAPAROSCOPIC APPENDECTOMY N/A 10/03/2017   Procedure: APPENDECTOMY LAPAROSCOPIC;  Surgeon: Leighton Ruff, MD;  Location: [facility];  Service: General;  Laterality: N/A;  . RIGHT/LEFT HEART CATH AND CORONARY ANGIOGRAPHY N/A 02/12/2018   Procedure:  RIGHT/LEFT HEART CATH AND CORONARY ANGIOGRAPHY;  Surgeon: Jettie Booze, MD;  Location: [facility];  Service: Cardiovascular;  Laterality: N/A;  . TUBAL LIGATION       Current Outpatient Medications  Medication Sig Dispense Refill  . albuterol (PROAIR HFA) 108 (90 Base) MCG/ACT inhaler Inhale 2 puffs into the lungs every 6 (six) hours as needed. wheezing 18 g 2  . aspirin 81 MG chewable tablet TAKE 1 TABLET BY MOUTH DAILY. 90 tablet 3  . bismuth subsalicylate (PEPTO BISMOL) 262 MG chewable tablet Chew 524 mg by mouth as needed for indigestion or diarrhea or loose stools.     . carvedilol (COREG) 12.5 MG tablet TAKE 1 TABLET BY MOUTH 2 TIMES A DAY. 180 tablet 3  . cetirizine (ZYRTEC) 10 MG tablet Take 10 mg by mouth as needed.     . diphenoxylate-atropine (LOMOTIL) 2.5-0.025 MG tablet Take 1 tablet by mouth 4 (four) times daily as needed for diarrhea or loose stools. 30 tablet 0  . Dulaglutide (TRULICITY) 3 KZ/6.0FU SOPN Inject 3 mg into the skin once a week. 6 mL 3  . ENTRESTO 49-51 MG TAKE 1 TABLET BY MOUTH 2 TIMES DAILY. 60 tablet 1  . Evolocumab (REPATHA SURECLICK) 932 MG/ML SOAJ Inject 1 pen into the skin every 14 (fourteen) days. 2 mL 11  . fluticasone (FLONASE) 50 MCG/ACT nasal spray Place 2 sprays into both nostrils daily as needed for allergies or rhinitis.    . furosemide (LASIX) 40 MG tablet Take 40 mg by mouth 2 (two) times daily.    Marland Kitchen glucose blood (FREESTYLE LITE) test strip Use as instructed 2x a day 200 each 3  . glucose blood test strip     . Insulin Degludec (TRESIBA FLEXTOUCH) 200 UNIT/ML SOPN Inject 40 Units into the skin daily. 6 pen 3  . Insulin Pen Needle 32G X 4 MM MISC Use 1x a day 100 each 3  . levothyroxine (SYNTHROID) 50 MCG tablet TAKE 1 TABLET BY MOUTH DAILY BEFORE BREAKFAST. 90 tablet 0  . magnesium oxide (MAG-OX) 400 MG tablet Take 400 mg by mouth at bedtime as needed (for cramps).    . metFORMIN (GLUCOPHAGE) 1000 MG tablet TAKE 1 TABLET (1,000 MG  TOTAL) BY MOUTH TWICE A DAY WITH A MEAL. 180 tablet 1  . Multiple Vitamin (MULITIVITAMIN WITH MINERALS) TABS Take 1 tablet by mouth daily with breakfast.    . spironolactone (ALDACTONE) 25 MG tablet Take 1 tablet (25 mg total) by mouth daily. 90 tablet 3   No current facility-administered medications for this visit.    Allergies:   Amoxicillin, Adhesive [tape], Exenatide, Invokana [canagliflozin], Jardiance [empagliflozin], Lisinopril, Other, and Statins    Social History:  The patient  reports that she quit smoking about 30 years ago. Her smoking use included cigarettes. She has a 15.00 pack-year smoking history. She has never used smokeless tobacco. She reports that she does not drink alcohol and does not use drugs.   Family  History:  The patient's family history includes Alcohol abuse in her father and mother; Anxiety disorder in her mother; Arthritis in her mother; Cancer in her father; Diabetes in her mother; Eating disorder in her mother; Heart disease in her father; Hypertension in her father; Obesity in her mother; Sleep apnea in her mother and sister; Stroke in her father.    ROS:  Please see the history of present illness.   Otherwise, review of systems are positive for difficulty losing weight.   All other systems are reviewed and negative.    PHYSICAL EXAM: VS:  BP 112/70   Pulse 94   Ht 5\' 7"  (1.702 m)   Wt 298 lb 12.8 oz (135.5 kg)   SpO2 98%   BMI 46.80 kg/m  , BMI Body mass index is 46.8 kg/m. GEN: Well nourished, well developed, in no acute distress  HEENT: normal  Neck: no JVD, carotid bruits, or masses Cardiac: RRR; no murmurs, rubs, or gallops,no edema  Respiratory:  clear to auscultation bilaterally, normal work of breathing GI: soft, nontender, nondistended, + BS MS: no deformity or atrophy  Skin: warm and dry, no rash Neuro:  Strength and sensation are intact Psych: euthymic mood, full affect    Recent Labs: 01/15/2020: ALT 30; Hemoglobin 14.9; Platelets  296 05/19/2020: BUN 13; Creatinine, Ser 0.81; Potassium 4.1; Sodium 133   Lipid Panel    Component Value Date/Time   CHOL 158 11/25/2019 0832   TRIG 137 11/25/2019 0832   HDL 53 11/25/2019 0832   CHOLHDL 3.0 11/25/2019 0832   CHOLHDL 4.1 03/14/2016 1228   VLDL 28 03/14/2016 1228   LDLCALC 81 11/25/2019 0832     Other studies Reviewed: Additional studies/ records that were reviewed today with results demonstrating: labs from 2021.   ASSESSMENT AND PLAN:  1. Chronic systolic heart failure: Feels well on her meds.  Appears euvolemic.  2. Hyperlipidemia: LDL 81 in 3/21.  Doing well with Repatha. 3. CAD: No angina. Continue medical therapy.  4. Morbid obesity: working on weight loss with diet.   5. Type 2 diabetes: Poorly controlled.  Wants to avoid insulin. She may need a new endocrinologist.  I suggested Dr. Chalmers Cater or Dr. Buddy Duty.  6. She prefers to f/u with CHF clinic.  Will see if [facility] can coordinate her repatha through CHF clinic.    Current medicines are reviewed at length with the patient today.  The patient concerns regarding her medicines were addressed.  The following changes have been made:  No change  Labs/ tests ordered today include:  No orders of the defined types were placed in this encounter.   Recommend 150 minutes/week of aerobic exercise Low fat, low carb, high fiber diet recommended  Disposition:   FU in 1 year   Signed, Larae Grooms, MD  06/14/2020 11:50 AM    [facility] [place], [place], [place]  [address] Phone: [phone]; Fax: [phone]

## 2020-06-14 ENCOUNTER — Encounter: Payer: Self-pay | Admitting: Internal Medicine

## 2020-06-14 ENCOUNTER — Ambulatory Visit: Payer: 59 | Admitting: Interventional Cardiology

## 2020-06-14 ENCOUNTER — Encounter: Payer: Self-pay | Admitting: Interventional Cardiology

## 2020-06-14 ENCOUNTER — Ambulatory Visit: Payer: Managed Care, Other (non HMO) | Admitting: Internal Medicine

## 2020-06-14 ENCOUNTER — Other Ambulatory Visit: Payer: Self-pay

## 2020-06-14 ENCOUNTER — Other Ambulatory Visit: Payer: Self-pay | Admitting: Internal Medicine

## 2020-06-14 VITALS — BP 112/70 | HR 94 | Ht 67.0 in | Wt 298.8 lb

## 2020-06-14 VITALS — BP 102/70 | HR 91 | Ht 67.0 in | Wt 298.8 lb

## 2020-06-14 DIAGNOSIS — E7849 Other hyperlipidemia: Secondary | ICD-10-CM | POA: Diagnosis not present

## 2020-06-14 DIAGNOSIS — I2510 Atherosclerotic heart disease of native coronary artery without angina pectoris: Secondary | ICD-10-CM

## 2020-06-14 DIAGNOSIS — E782 Mixed hyperlipidemia: Secondary | ICD-10-CM

## 2020-06-14 DIAGNOSIS — E119 Type 2 diabetes mellitus without complications: Secondary | ICD-10-CM

## 2020-06-14 DIAGNOSIS — I5022 Chronic systolic (congestive) heart failure: Secondary | ICD-10-CM | POA: Diagnosis not present

## 2020-06-14 DIAGNOSIS — E1159 Type 2 diabetes mellitus with other circulatory complications: Secondary | ICD-10-CM | POA: Diagnosis not present

## 2020-06-14 DIAGNOSIS — E1165 Type 2 diabetes mellitus with hyperglycemia: Secondary | ICD-10-CM

## 2020-06-14 LAB — POCT GLYCOSYLATED HEMOGLOBIN (HGB A1C): Hemoglobin A1C: 11.8 % — AB (ref 4.0–5.6)

## 2020-06-14 MED ORDER — TRULICITY 3 MG/0.5ML ~~LOC~~ SOAJ
3.0000 mg | SUBCUTANEOUS | 3 refills | Status: DC
Start: 2020-06-14 — End: 2020-06-28

## 2020-06-14 NOTE — Addendum Note (Signed)
Addended by: Caprice Beaver T on: 06/14/2020 09:15 AM   Modules accepted: Orders

## 2020-06-14 NOTE — Progress Notes (Signed)
Patient ID: Terri Heath, female   DOB: 04/24/1956, 64 y.o.   MRN: [id number]   This visit occurred during the SARS-CoV-2 public health emergency.  Safety protocols were in place, including screening questions prior to the visit, additional usage of staff PPE, and extensive cleaning of exam room while observing appropriate contact time as indicated for disinfecting solutions.    HPI: Terri Heath is a 64 y.o.-year-old female, returning for f/u for DM2, dx in 2003, insulin-dependent, uncontrolled, with complications (PVD, CAD, sCHF, DR). Last visit 8.5 months ago.  She was in the ED with diarrhea 12/2019.  Reviewed HbA1c levels: Lab Results  Component Value Date   HGBA1C 14.4 (H) 09/22/2019   HGBA1C 14.0 (A) 04/11/2019   HGBA1C 10.3 (A) 07/24/2018   HGBA1C 11.9 (A) 04/18/2018   HGBA1C 14.0 (A) 01/16/2018   HGBA1C 11.9 01/18/2017   HGBA1C 11.4 09/27/2016   HGBA1C 9.8 (H) 07/05/2016   HGBA1C 9.9 (H) 03/14/2016   HGBA1C 10.0 (H) 10/27/2015   At last visit she was taking: - Metformin 1000 mg 2x a day with meals -  >> ran out  07/2019 - Tresiba 30 units daily - started 09/10/2019 - >> stopped b/c dehydration and yeast infection She has been on: - Actos - JanuMet - Invokana >> yeast vaginitis - [place] >> flu-like sxs - Humalog >> wt gain  - stopped insulin 2015 >> stopped >> lost 90 lbs! - Glipizide >> leg edema - Tyler Aas - 06/2018 >> stopped because of weight gain  I suggested: - Metformin 1000 mg 2x a day with meals - Trulicity 1.5 mg weekly - Tresiba 36 units daily  >> STOPPED 2/2 weight gain and hunger 10/2019  Pt checks her sugars 1-2 times a day: - am: 230s >> 180-225 >> 247-276 >> 230s - 2h after b'fast: n/c - before lunch: 284 >> n/c - 2h after lunch: n/c - before dinner: 268 >> n/c - 2h after dinner: 275-297 >> 235-250 - bedtime: n/c - nighttime: n/c >> 292, 301 Lowest sugar was 198 >> 180 >> 247 >> 230. she has hypoglycemia awareness at 100.  Highest  sugar was 311 >> 225 >> 490 >> 300s (icecream, bread, potatoes).  Glucometer: ?  Pt's meals are: - Breakfast: 2 eggs + bacon or coffee - Lunch: meat + green veggies - Dinner: meat + veggies  -No CKD, last BUN/creatinine:  Lab Results  Component Value Date   BUN 13 05/19/2020   BUN 12 01/15/2020   CREATININE 0.81 05/19/2020   CREATININE 0.68 01/15/2020  On Entresto.  -+ HL; last set of lipids: Lab Results  Component Value Date   CHOL 158 11/25/2019   HDL 53 11/25/2019   LDLCALC 81 11/25/2019   TRIG 137 11/25/2019   CHOLHDL 3.0 11/25/2019  On Repatha.  She could not tolerate statins in the past due to muscle aches.  - last eye exam was in 2021: + DR reportedly -+ Numbness and tingling in her feet. On ASA 81.  Pt has FH of DM in PGM, P aunts and uncles, M.  She has a history of MGUS, OSA, hypothyroidism.  Latest TSH was normal: Lab Results  Component Value Date   TSH 2.920 04/11/2019   ROS: Constitutional: no weight gain/no weight loss, no fatigue, no subjective hyperthermia, no subjective hypothermia Eyes: no blurry vision, no xerophthalmia ENT: no sore throat, no nodules palpated in neck, no dysphagia, no odynophagia, no hoarseness Cardiovascular: no CP/no SOB/no palpitations/no leg swelling Respiratory: no cough/no  SOB/no wheezing Gastrointestinal: no N/no V/no D/no C/no acid reflux Musculoskeletal: no muscle aches/no joint aches Skin: no rashes, no hair loss Neurological: no tremors/+ numbness/+ tingling/no dizziness  I reviewed pt's medications, allergies, PMH, social hx, family hx, and changes were documented in the history of present illness. Otherwise, unchanged from my initial visit note.  Past Medical History:  Diagnosis Date  . Allergy   . Asthma   . Bilateral shoulder pain   . Borderline hypertension   . Bronchitis   . CAD (coronary artery disease)   . Chronic systolic CHF (congestive heart failure) ([place])   . Diabetes mellitus    Diagnosed in  2000  . History of MI (myocardial infarction)   . Hyperlipidemia   . Hypertension   . Hypothyroidism   . MGUS (monoclonal gammopathy of unknown significance)   . Mitral regurgitation   . Obesity   . Sleep apnea   . Statin intolerance    Past Surgical History:  Procedure Laterality Date  . BREATH TEK H PYLORI  09/18/2011   Procedure: BREATH TEK H PYLORI;  Surgeon: Shann Medal, MD;  Location: [facility];  Service: General;  Laterality: N/A;  . CESAREAN SECTION     x 3  . LAPAROSCOPIC APPENDECTOMY N/A 10/03/2017   Procedure: APPENDECTOMY LAPAROSCOPIC;  Surgeon: Leighton Ruff, MD;  Location: [facility];  Service: General;  Laterality: N/A;  . RIGHT/LEFT HEART CATH AND CORONARY ANGIOGRAPHY N/A 02/12/2018   Procedure: RIGHT/LEFT HEART CATH AND CORONARY ANGIOGRAPHY;  Surgeon: Jettie Booze, MD;  Location: [facility];  Service: Cardiovascular;  Laterality: N/A;  . TUBAL LIGATION     Social History   Socioeconomic History  . Marital status: Married    Spouse name: Not on file  . Number of children: Not on file  . Years of education: Not on file  . Highest education level: Not on file  Occupational History  . Not on file  Tobacco Use  . Smoking status: Former Smoker    Packs/day: 1.00    Years: 15.00    Pack years: 15.00    Types: Cigarettes    Quit date: 01/28/1990    Years since quitting: 30.3  . Smokeless tobacco: Never Used  Vaping Use  . Vaping Use: Never used  Substance and Sexual Activity  . Alcohol use: No  . Drug use: No  . Sexual activity: Not Currently    Birth control/protection: None  Other Topics Concern  . 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 Difficulty of Paying Living Expenses: Not on file  Food Insecurity:   . Worried About Charity fundraiser in the Last Year: Not on file  . Ran Out of Food in the  Last Year: Not on file  Transportation Needs:   . Lack of Transportation (Medical): Not on file  . Lack of Transportation (Non-Medical): Not on file  Physical Activity:   . Days of Exercise per Week: Not on file  . Minutes of Exercise per Session: Not on file  Stress:   . Feeling of Stress : Not on file  Social Connections:   . Frequency of Communication with Friends and Family: Not on file  . Frequency of Social Gatherings with Friends and Family: Not on file  . Attends Religious Services: Not on file  .  Active Member of Clubs or Organizations: Not on file  . Attends Archivist Meetings: Not on file  . Marital Status: Not on file  Intimate Partner Violence:   . Fear of Current or Ex-Partner: Not on file  . Emotionally Abused: Not on file  . Physically Abused: Not on file  . Sexually Abused: Not on file   Current Outpatient Medications on File Prior to Visit  Medication Sig Dispense Refill  . albuterol (PROAIR HFA) 108 (90 Base) MCG/ACT inhaler Inhale 2 puffs into the lungs every 6 (six) hours as needed. wheezing 18 g 2  . aspirin 81 MG chewable tablet TAKE 1 TABLET BY MOUTH DAILY. 90 tablet 3  . bismuth subsalicylate (PEPTO BISMOL) 262 MG chewable tablet Chew 524 mg by mouth as needed for indigestion or diarrhea or loose stools.     . carvedilol (COREG) 12.5 MG tablet TAKE 1 TABLET BY MOUTH 2 TIMES A DAY. 180 tablet 3  . cetirizine (ZYRTEC) 10 MG tablet Take 10 mg by mouth as needed.     . diphenoxylate-atropine (LOMOTIL) 2.5-0.025 MG tablet Take 1 tablet by mouth 4 (four) times daily as needed for diarrhea or loose stools. 30 tablet 0  . Dulaglutide (TRULICITY) 1.5 UK/0.2RK SOPN Inject 1.5 mg into the skin once a week. 2 mL 11  . ENTRESTO 49-51 MG TAKE 1 TABLET BY MOUTH 2 TIMES DAILY. 60 tablet 1  . Evolocumab (REPATHA SURECLICK) 270 MG/ML SOAJ Inject 1 pen into the skin every 14 (fourteen) days. 2 mL 11  . fluticasone (FLONASE) 50 MCG/ACT nasal spray Place 2 sprays into  both nostrils daily as needed for allergies or rhinitis.    . furosemide (LASIX) 40 MG tablet Take 40 mg by mouth 2 (two) times daily.    Marland Kitchen glucose blood (FREESTYLE LITE) test strip Use as instructed 2x a day 200 each 3  . glucose blood test strip     . Insulin Degludec (TRESIBA FLEXTOUCH) 200 UNIT/ML SOPN Inject 40 Units into the skin daily. 6 pen 3  . Insulin Pen Needle 32G X 4 MM MISC Use 1x a day 100 each 3  . levothyroxine (SYNTHROID) 50 MCG tablet TAKE 1 TABLET BY MOUTH DAILY BEFORE BREAKFAST. 90 tablet 0  . magnesium oxide (MAG-OX) 400 MG tablet Take 400 mg by mouth at bedtime as needed (for cramps).    . metFORMIN (GLUCOPHAGE) 1000 MG tablet TAKE 1 TABLET (1,000 MG TOTAL) BY MOUTH TWICE A DAY WITH A MEAL. 180 tablet 1  . Multiple Vitamin (MULITIVITAMIN WITH MINERALS) TABS Take 1 tablet by mouth daily with breakfast.    . spironolactone (ALDACTONE) 25 MG tablet Take 1 tablet (25 mg total) by mouth daily. 90 tablet 3   No current facility-administered medications on file prior to visit.   Allergies  Allergen Reactions  . Amoxicillin Other (See Comments)    Dizziness, abdominal pain  . Adhesive [Tape] Rash  . Exenatide Other (See Comments)    Flu-like symptoms  . Invokana [Canagliflozin] Other (See Comments)    Yeast infection/dehydration  . Jardiance [Empagliflozin] Other (See Comments)    Yeast infections and dehydration  . Lisinopril Other (See Comments)    cramping  . Other Diarrhea and Other (See Comments)    Kale=food  Per patient, it causes GI bleeding   . Statins Other (See Comments)    Leg cramps   Family History  Problem Relation Age of Onset  . Alcohol abuse Mother   . Arthritis Mother   .  Diabetes Mother   . Sleep apnea Mother   . Anxiety disorder Mother   . Eating disorder Mother   . Obesity Mother   . Alcohol abuse Father   . Heart disease Father   . Hypertension Father   . Stroke Father   . Cancer Father   . Sleep apnea Sister   . Breast cancer Neg  Hx     PE: BP 102/70   Pulse 91   Ht 5\' 7"  (1.702 m)   Wt 298 lb 12.8 oz (135.5 kg)   SpO2 98%   BMI 46.80 kg/m  Wt Readings from Last 3 Encounters:  06/14/20 298 lb 12.8 oz (135.5 kg)  04/26/20 297 lb (134.7 kg)  01/15/20 297 lb (134.7 kg)   Constitutional: overweight, in NAD Eyes: PERRLA, EOMI, no exophthalmos ENT: moist mucous membranes, no thyromegaly, no cervical lymphadenopathy Cardiovascular: RRR, No MRG Respiratory: CTA B Gastrointestinal: abdomen soft, NT, ND, BS+ Musculoskeletal: no deformities, strength intact in all 4 Skin: moist, warm, no rashes Neurological: no tremor with outstretched hands, DTR normal in all 4  ASSESSMENT: 1. DM2, insulin-dependent, uncontrolled, with long-term complications - PVD - CAD, sCHF - DR  2. HL  3.  Obesity  PLAN:  1. Patient with longstanding, uncontrolled, type 2 diabetes, on Metformin and weekly GLP-1 receptor agonist, noncompliant with insulin due to weight gain.  Hello a none at the beginning of the year, she was off insulin and sugars increase significantly, almost to 500s.  At that time HbA1c was very high, at 14.4%.  She restarted [place] afterwards and we increased the dose at last visit.  I also advised her to restart Trulicity at that time.  Of note, she was on Jardiance in the past but had to stop due to increased urination, dehydration, and yeast infections. -Reviewing her chart she recently had a glucose of 322 on 05/19/2020. -At today's visit, she tells me that her sugars run between 200s and 300s.  She again stopped insulin since last visit and would not want to restart it due to weight gain and hunger.  We discussed that unfortunately, she is very clinical toxic and has been for a long time and we cannot manage her without insulin.  Weight gain, we can manage with a weight loss program.  She has seen the weight specialist in the weight management clinic and would not want to return to see them.  She mentions that she  does not eat much and tries to limit carbs to only 100 g a day.  Unfortunately, in this case, I would not be able to help her further.  We did discuss about possible consequences of allowing her sugars to stay still high for so long.  She understands them.  I suggested to increase her Trulicity dose now, but, since she does not accept insulin and I do not see another option for treatment for now, I would not be able to continue to see her.  She agrees to establish care with another endocrinologist. - I suggested to:  Patient Instructions  Please continue: - Metformin 1000 mg 2x a day with meals  Please increase: - Trulicity to 3 mg weekly  I suggest restarting and staying on insulin.  Please establish care with another endocrinologist.  - we checked her HbA1c: 11.8% (lower, but still very high). - advised to check sugars at different times of the day - 1-2x a day, rotating check times - advised for yearly eye exams >> she is UTD -  return to clinic in 3 months   2. HL -Reviewed latest lipid panel from 10/2019: LDL above target of less than 70, triglycerides at goal: Lab Results  Component Value Date   CHOL 158 11/25/2019   HDL 53 11/25/2019   LDLCALC 81 11/25/2019   TRIG 137 11/25/2019   CHOLHDL 3.0 11/25/2019  -She could not tolerate statins due to leg cramps.  Now on Repatha 1 pen every 2 weeks  3.  Obesity class III -At last visit we restarted the GLP-1 receptor agonist, which should also help with weight loss.  We will increase the dose of this today. -She lost 4 pounds since last visit  Philemon Kingdom, MD PhD [facility]

## 2020-06-14 NOTE — Patient Instructions (Signed)
Medication Instructions:  Your physician recommends that you continue on your current medications as directed. Please refer to the Current Medication list given to you today.  *If you need a refill on your cardiac medications before your next appointment, please call your pharmacy*   Lab Work: None  If you have labs (blood work) drawn today and your tests are completely normal, you will receive your results only by:  [place] (if you have MyChart) OR  A paper copy in the mail If you have any lab test that is abnormal or we need to change your treatment, we will call you to review the results.   Testing/Procedures: None   Follow-Up: AS NEEDED with Dr. Irish Lack  Other Instructions None

## 2020-06-14 NOTE — Patient Instructions (Addendum)
Please continue: - Metformin 1000 mg 2x a day with meals  Please increase: - Trulicity to 3 mg weekly  I suggest restarting and staying on insulin.  Please establish care with another endocrinologist.

## 2020-06-21 ENCOUNTER — Other Ambulatory Visit: Payer: Self-pay | Admitting: Interventional Cardiology

## 2020-06-21 MED FILL — ENTRESTO 49 MG-51 MG TABLET: 49-51 | 30 days supply | Qty: 60 | Fill #1

## 2020-06-22 ENCOUNTER — Other Ambulatory Visit: Payer: Self-pay | Admitting: Interventional Cardiology

## 2020-06-22 MED FILL — FUROSEMIDE 40 MG TAB: 40 | 90 days supply | Qty: 180 | Fill #0

## 2020-06-25 MED FILL — TRULICITY 3 MG/0.5ML SOPN: 3 | 28 days supply | Qty: 2 | Fill #0

## 2020-06-28 ENCOUNTER — Telehealth: Payer: Self-pay

## 2020-06-28 MED ORDER — TRULICITY 3 MG/0.5ML ~~LOC~~ SOAJ: 3.0000 mg | SUBCUTANEOUS | 3 refills | Status: DC

## 2020-06-28 NOTE — Telephone Encounter (Signed)
Received notification that PA for Trulicity was not needed per plan- sent back to pharmacy.  N/Aon October 28 CaseId:[id number];Status:Cancelled;Explanation:PA not Required.The Prescribed limit is below the plan limit;

## 2020-07-05 ENCOUNTER — Other Ambulatory Visit: Payer: Self-pay | Admitting: Interventional Cardiology

## 2020-07-05 MED FILL — METFORMIN HCL 1000 MG TABS: 1000 | 90 days supply | Qty: 180 | Fill #1

## 2020-07-05 MED FILL — REPATHA SURECLICK 140 MG/ML: 140 | 28 days supply | Qty: 2 | Fill #2

## 2020-07-05 MED FILL — ASPIRIN CHILD 81 MG TAB CHE: 81 | 90 days supply | Qty: 90 | Fill #1

## 2020-07-05 MED FILL — SPIRONOLACTONE 25 MG TABS: 25 | 90 days supply | Qty: 90 | Fill #2

## 2020-07-06 NOTE — Telephone Encounter (Signed)
Pt's pharmacy is requesting a refill on levothyroxine. Would Dr. Varanasi like to refill this medication? Please address °

## 2020-07-06 NOTE — Telephone Encounter (Signed)
Last refill stated that future refills should come from PCP

## 2020-07-07 ENCOUNTER — Other Ambulatory Visit: Payer: Self-pay | Admitting: Family Medicine

## 2020-07-07 MED ORDER — LEVOTHYROXINE SODIUM 50 MCG PO TABS: ORAL_TABLET | ORAL | 0 refills | Status: DC

## 2020-07-07 MED FILL — LEVOTHYROXINE 50 MCG TABLET: 50 | 30 days supply | Qty: 30 | Fill #0

## 2020-07-07 NOTE — Telephone Encounter (Signed)
Courtesy refill patient has an appointment in one week.

## 2020-07-07 NOTE — Telephone Encounter (Signed)
° °  Notes to clinic:  Patient has upcoming appointment Medication was filled by a different provider    Requested Prescriptions  Pending Prescriptions Disp Refills   levothyroxine (SYNTHROID) 50 MCG tablet 90 tablet 0      Endocrinology:  Hypothyroid Agents Failed - 07/07/2020  9:22 AM      Failed - TSH needs to be rechecked within 3 months after an abnormal result. Refill until TSH is due.      Failed - TSH in normal range and within 360 days    TSH  Date Value Ref Range Status  04/11/2019 2.920 0.450 - 4.500 uIU/mL Final          Failed - Valid encounter within last 12 months    Recent Outpatient Visits           1 year ago Annual physical exam   [facility] A, MD   1 year ago Diabetes mellitus type 2, insulin dependent ([place])   [facility] at [place], MD   2 years ago Acute systolic heart failure [facility])   [facility] at [place], MD   2 years ago Bibasilar crackles   [facility] at [place], MD   2 years ago Diarrhea, unspecified type   [facility] at [place], PA-C       Future Appointments             In 1 week Just, Laurita Quint, FNP [facility] at [place], [facility]   In 6 months Just, Laurita Quint, FNP [facility] at [place], [facility]

## 2020-07-07 NOTE — Telephone Encounter (Signed)
Patient requesting levothyroxine (SYNTHROID) 50 MCG tablet , informed please allow 48 to 72 hour turn around time   [place], [place] - [address] [facility]. Phone:  [phone]  Fax:  [phone]

## 2020-07-13 ENCOUNTER — Other Ambulatory Visit: Payer: Self-pay | Admitting: Family Medicine

## 2020-07-13 DIAGNOSIS — Z1231 Encounter for screening mammogram for malignant neoplasm of breast: Secondary | ICD-10-CM

## 2020-07-14 ENCOUNTER — Other Ambulatory Visit: Payer: Self-pay | Admitting: Family Medicine

## 2020-07-14 ENCOUNTER — Ambulatory Visit: Payer: Managed Care, Other (non HMO) | Admitting: Family Medicine

## 2020-07-14 ENCOUNTER — Encounter: Payer: Self-pay | Admitting: Family Medicine

## 2020-07-14 ENCOUNTER — Other Ambulatory Visit: Payer: Self-pay

## 2020-07-14 ENCOUNTER — Ambulatory Visit
Admission: RE | Admit: 2020-07-14 | Discharge: 2020-07-14 | Disposition: A | Discharge status: Home / Self Care | Payer: Managed Care, Other (non HMO) | Source: Ambulatory Visit | Attending: Family Medicine | Admitting: Family Medicine

## 2020-07-14 VITALS — BP 113/77 | HR 95 | Temp 97.7°F | Ht 67.0 in | Wt 296.0 lb

## 2020-07-14 DIAGNOSIS — Z1231 Encounter for screening mammogram for malignant neoplasm of breast: Secondary | ICD-10-CM

## 2020-07-14 DIAGNOSIS — E559 Vitamin D deficiency, unspecified: Secondary | ICD-10-CM | POA: Diagnosis not present

## 2020-07-14 DIAGNOSIS — I5022 Chronic systolic (congestive) heart failure: Secondary | ICD-10-CM | POA: Diagnosis not present

## 2020-07-14 DIAGNOSIS — E039 Hypothyroidism, unspecified: Secondary | ICD-10-CM

## 2020-07-14 DIAGNOSIS — E2839 Other primary ovarian failure: Secondary | ICD-10-CM

## 2020-07-14 DIAGNOSIS — E119 Type 2 diabetes mellitus without complications: Secondary | ICD-10-CM | POA: Diagnosis not present

## 2020-07-14 MED ORDER — LEVOTHYROXINE SODIUM 50 MCG PO TABS: ORAL_TABLET | ORAL | 3 refills | Status: DC

## 2020-07-14 NOTE — Progress Notes (Signed)
11/17/202112:25 PM  Terri Heath 11-19-1955, 64 y.o., female [id number]  Chief Complaint  Patient presents with  . Diabetes    medication refills    HPI:   Patient is a 64 y.o. female with past medical history significant for DM, PVD, CAD, sCHF, DR who presents today for follow up.  Overall doing well Is requesting primary care follows up with her diabetes  Continues to work on diet  Patient Care Team: Jahzion Brogden, Laurita Quint, FNP as PCP - General (Family Medicine) Jettie Booze, MD as PCP - Cardiology (Cardiology) Rigoberto Noel, MD as Consulting Physician (Pulmonary Disease) Alphonsa Overall, MD (General Surgery)  10/18 Cardiology: Dr. Aundra Dubin next appointment 12/29 CHF and cholesterol 1. Chronic systolic heart failure: Feels well on her meds.  Appears euvolemic.  2. Hyperlipidemia: LDL 81 in 3/21.  Doing well with Repatha. 3. CAD: No angina. Continue medical therapy.  4. Morbid obesity: working on weight loss with diet.   5. Type 2 diabetes: Poorly controlled.  Wants to avoid insulin. She may need a new endocrinologist.  I suggested Dr. Chalmers Cater or Dr. Buddy Duty.  6. She prefers to f/u with CHF clinic.  Will see if PharmDs can coordinate her repatha through CHF clinic.  10/18: Endo:  1. Patient with longstanding, uncontrolled, type 2 diabetes, on Metformin and weekly GLP-1 receptor agonist, noncompliant with insulin due to weight gain.  Hello a none at the beginning of the year, she was off insulin and sugars increase significantly, almost to 500s.  At that time HbA1c was very high, at 14.4%.  She restarted [place] afterwards and we increased the dose at last visit.  I also advised her to restart Trulicity at that time.  Of note, she was on Jardiance in the past but had to stop due to increased urination, dehydration, and yeast infections. -Reviewing her chart she recently had a glucose of 322 on 05/19/2020. -At today's visit, she tells me that her sugars run between 200s and 300s.   She again stopped insulin since last visit and would not want to restart it due to weight gain and hunger.  We discussed that unfortunately, she is very clinical toxic and has been for a long time and we cannot manage her without insulin.  Weight gain, we can manage with a weight loss program.  She has seen the weight specialist in the weight management clinic and would not want to return to see them.  She mentions that she does not eat much and tries to limit carbs to only 100 g a day.  Unfortunately, in this case, I would not be able to help her further.  We did discuss about possible consequences of allowing her sugars to stay still high for so long.  She understands them.  I suggested to increase her Trulicity dose now, but, since she does not accept insulin and I do not see another option for treatment for now, I would not be able to continue to see her.  She agrees to establish care with another endocrinologist. - I suggested to:  Patient Instructions  Please continue: - Metformin 1000 mg 2x a day with meals  Please increase: - Trulicity to 3 mg weekly  I suggest restarting and staying on insulin.  Please establish care with another endocrinologist.  - we checked her HbA1c: 11.8% (lower, but still very high). - advised to check sugars at different times of the day - 1-2x a day, rotating check times - advised for yearly eye  exams >> she is UTD - return to clinic in 3 months     2. HL -Reviewed latest lipid panel from 10/2019: LDL above target of less than 70, triglycerides at goal: Recent Labs       Lab Results  Component Value Date   CHOL 158 11/25/2019   HDL 53 11/25/2019   LDLCALC 81 11/25/2019   TRIG 137 11/25/2019   CHOLHDL 3.0 11/25/2019    -She could not tolerate statins due to leg cramps.  Now on Repatha 1 pen every 2 weeks  3.  Obesity class III -At last visit we restarted the GLP-1 receptor agonist, which should also help with weight loss.  We will increase  the dose of this today. -She lost 4 pounds since last visit  Hypothyroidism: Patient presents for evaluation of thyroid function. The problem has been stable.  She is taking her thyroid medication by itself first thing in the morning and waits an hour before eating or taking meds.  She denies palpitations, jittery sensation, diarrhea, or skin changes, no mood changes. Lab Results  Component Value Date   TSH 2.920 04/11/2019     Dyslipidemia Patient is on repatha for her dyslipidemia She could not tolerate statins She has a history of heart disease She states that she is doing much better  DM 161-096 at home BG Trulicity: 3mg  Metformin 2000mg  Insulin at home in the fridge No Hyperglycemia issues since January Did not tolerate: Invokana, Jardiance, glipizide, actos and ozempic   Spot on Ear: was froze off by Dermatology, she continues to follow up with them  Dexa 2005: ordered PaP: 04/11/2019 nil, HPV neg Mammogram: 04/21/19: [facility] 1: will try to do today Cologuard: 05/01/19: negative   Depression screen [facility] 2/9 07/14/2020 09/22/2019 04/11/2019  Decreased Interest 0 1 0  Down, Depressed, Hopeless 0 1 0  PHQ - 2 Score 0 2 0  Altered sleeping - 0 -  Tired, decreased energy - 1 -  Change in appetite - 1 -  Feeling bad or failure about yourself  - 0 -  Trouble concentrating - 0 -  Moving slowly or fidgety/restless - 0 -  Suicidal thoughts - 0 -  PHQ-9 Score - 4 -  Difficult doing work/chores - Not difficult at all -  Some recent data might be hidden    Fall Risk  07/14/2020 04/11/2019 04/11/2019 07/24/2018 02/14/2018  Falls in the past year? 0 0 0 1 No  Number falls in past yr: 0 0 0 1 -  Injury with Fall? 0 0 0 1 -  Risk for fall due to : - - - - -  Risk for fall due to: Comment - - - - -  Follow up Falls evaluation completed - Falls evaluation completed - -     Allergies  Allergen Reactions  . Amoxicillin Other (See Comments)    Dizziness, abdominal pain  .  Adhesive [Tape] Rash  . Exenatide Other (See Comments)    Flu-like symptoms  . Invokana [Canagliflozin] Other (See Comments)    Yeast infection/dehydration  . Jardiance [Empagliflozin] Other (See Comments)    Yeast infections and dehydration  . Lisinopril Other (See Comments)    cramping  . Other Diarrhea and Other (See Comments)    Kale=food  Per patient, it causes GI bleeding   . Statins Other (See Comments)    Leg cramps    Prior to Admission medications   Medication Sig Start Date End Date Taking? Authorizing Provider  albuterol (  PROAIR HFA) 108 (90 Base) MCG/ACT inhaler Inhale 2 puffs into the lungs every 6 (six) hours as needed. wheezing 02/21/19   Rigoberto Noel, MD  aspirin 81 MG chewable tablet TAKE 1 TABLET BY MOUTH DAILY. 10/14/19   Larey Dresser, MD  bismuth subsalicylate (PEPTO BISMOL) 262 MG chewable tablet Chew 524 mg by mouth as needed for indigestion or diarrhea or loose stools.     [provider]  carvedilol (COREG) 12.5 MG tablet TAKE 1 TABLET BY MOUTH 2 TIMES A DAY. 04/29/20   Larey Dresser, MD  cetirizine (ZYRTEC) 10 MG tablet Take 10 mg by mouth as needed.     [provider]  diphenoxylate-atropine (LOMOTIL) 2.5-0.025 MG tablet Take 1 tablet by mouth 4 (four) times daily as needed for diarrhea or loose stools. 01/15/20   Lamptey, Myrene Galas, MD  Dulaglutide (TRULICITY) 3 DJ/2.4QA SOPN Inject 3 mg into the skin once a week. 06/28/20   Philemon Kingdom, MD  ENTRESTO 49-51 MG TAKE 1 TABLET BY MOUTH 2 TIMES DAILY. 05/19/20   Larey Dresser, MD  Evolocumab (REPATHA SURECLICK) 834 MG/ML SOAJ Inject 1 pen into the skin every 14 (fourteen) days. 05/05/20   Larey Dresser, MD  fluticasone [facility]) 50 MCG/ACT nasal spray Place 2 sprays into both nostrils daily as needed for allergies or rhinitis.    [provider]  furosemide (LASIX) 40 MG tablet TAKE 1 TABLET (40 MG TOTAL) BY MOUTH 2 TIMES DAILY. 06/22/20   Jettie Booze, MD  glucose  blood (FREESTYLE LITE) test strip Use as instructed 2x a day 10/02/19   Philemon Kingdom, MD  glucose blood test strip  10/02/19   [provider]  Insulin Degludec (TRESIBA FLEXTOUCH) 200 UNIT/ML SOPN Inject 40 Units into the skin daily. 10/02/19   Philemon Kingdom, MD  Insulin Pen Needle 32G X 4 MM MISC Use 1x a day 10/02/19   Philemon Kingdom, MD  levothyroxine (SYNTHROID) 50 MCG tablet TAKE 1 TABLET BY MOUTH DAILY BEFORE BREAKFAST. 07/07/20   Nansi Birmingham, Laurita Quint, FNP  magnesium oxide (MAG-OX) 400 MG tablet Take 400 mg by mouth at bedtime as needed (for cramps).    [provider]  metFORMIN (GLUCOPHAGE) 1000 MG tablet TAKE 1 TABLET (1,000 MG TOTAL) BY MOUTH TWICE A DAY WITH A MEAL. 04/19/20   Philemon Kingdom, MD  Multiple Vitamin (MULITIVITAMIN WITH MINERALS) TABS Take 1 tablet by mouth daily with breakfast.    [provider]  spironolactone (ALDACTONE) 25 MG tablet Take 1 tablet (25 mg total) by mouth daily. 08/05/19 11/03/19  Larey Dresser, MD    Past Medical History:  Diagnosis Date  . Allergy   . Asthma   . Bilateral shoulder pain   . Borderline hypertension   . Bronchitis   . CAD (coronary artery disease)   . Chronic systolic CHF (congestive heart failure) ([place])   . Diabetes mellitus    Diagnosed in 2000  . History of MI (myocardial infarction)   . Hyperlipidemia   . Hypertension   . Hypothyroidism   . MGUS (monoclonal gammopathy of unknown significance)   . Mitral regurgitation   . Obesity   . Sleep apnea   . Statin intolerance     Past Surgical History:  Procedure Laterality Date  . BREATH TEK H PYLORI  09/18/2011   Procedure: BREATH TEK H PYLORI;  Surgeon: Shann Medal, MD;  Location: [facility];  Service: General;  Laterality: N/A;  . CESAREAN SECTION  x 3  . LAPAROSCOPIC APPENDECTOMY N/A 10/03/2017   Procedure: APPENDECTOMY LAPAROSCOPIC;  Surgeon: Leighton Ruff, MD;  Location: [facility];  Service: General;  Laterality: N/A;  . RIGHT/LEFT  HEART CATH AND CORONARY ANGIOGRAPHY N/A 02/12/2018   Procedure: RIGHT/LEFT HEART CATH AND CORONARY ANGIOGRAPHY;  Surgeon: Jettie Booze, MD;  Location: [facility];  Service: Cardiovascular;  Laterality: N/A;  . TUBAL LIGATION      Social History   Tobacco Use  . Smoking status: Former Smoker    Packs/day: 1.00    Years: 15.00    Pack years: 15.00    Types: Cigarettes    Quit date: 01/28/1990    Years since quitting: 30.4  . Smokeless tobacco: Never Used  Substance Use Topics  . Alcohol use: No    Family History  Problem Relation Age of Onset  . Alcohol abuse Mother   . Arthritis Mother   . Diabetes Mother   . Sleep apnea Mother   . Anxiety disorder Mother   . Eating disorder Mother   . Obesity Mother   . Alcohol abuse Father   . Heart disease Father   . Hypertension Father   . Stroke Father   . Cancer Father   . Sleep apnea Sister   . Breast cancer Neg Hx     Review of Systems  Constitutional: Negative for chills, fever and malaise/fatigue.  Eyes: Negative for blurred vision and double vision.  Respiratory: Negative for cough, shortness of breath and wheezing.   Cardiovascular: Negative for chest pain, palpitations and leg swelling.  Gastrointestinal: Negative for abdominal pain, blood in stool, constipation, diarrhea, heartburn, nausea and vomiting.  Genitourinary: Negative for dysuria, frequency and hematuria.  Musculoskeletal: Negative for back pain and joint pain.  Skin: Negative for rash.  Neurological: Negative for dizziness, weakness and headaches.     OBJECTIVE:  Today's Vitals   07/14/20 0806  BP: 113/77  Pulse: 95  Temp: 97.7 F (36.5 C)  SpO2: 95%  Weight: 296 lb (134.3 kg)  Height: 5\' 7"  (1.702 m)   Body mass index is 46.36 kg/m.   Physical Exam Constitutional:      General: She is not in acute distress.    Appearance: Normal appearance. She is not ill-appearing.  HENT:     Head: Normocephalic.  Cardiovascular:     Rate  and Rhythm: Normal rate and regular rhythm.     Pulses: Normal pulses.     Heart sounds: Normal heart sounds. No murmur heard.  No friction rub. No gallop.   Pulmonary:     Effort: Pulmonary effort is normal. No respiratory distress.     Breath sounds: Normal breath sounds. No stridor. No wheezing, rhonchi or rales.  Abdominal:     General: Bowel sounds are normal.     Palpations: Abdomen is soft.     Tenderness: There is no abdominal tenderness.  Musculoskeletal:     Right lower leg: No edema.     Left lower leg: No edema.  Skin:    General: Skin is warm and dry.  Neurological:     Mental Status: She is alert and oriented to person, place, and time.  Psychiatric:        Mood and Affect: Mood normal.        Behavior: Behavior normal.     No results found for this or any previous visit (from the past 24 hour(s)).  No results found.   ASSESSMENT and PLAN  Problem List  Items Addressed This Visit      Cardiovascular and Mediastinum   Chronic systolic congestive heart failure (HCC)     Endocrine   Acquired hypothyroidism   Relevant Medications   levothyroxine (SYNTHROID) 50 MCG tablet Stable on current regimen. Will follow up with lab results.   Other Relevant Orders   TSH Lab Results  Component Value Date   TSH 2.920 04/11/2019       Other Visit Diagnoses    Type 2 diabetes mellitus without complication, without long-term current use of insulin ([place])    -  Primary   Relevant Orders   HM Diabetes Foot Exam (Completed)   Lipid Panel On max Metformin and Trulicity Due to her intolerance of other DM medications there are no medications to add other than insulin. She does not want to be on insulin Could consider trying other medications of same class. Last A1C 1.8 on 10-18 Improved, but not at goal   Vitamin D deficiency       Relevant Orders   Vitamin D, 25-hydroxy Last vitamin D Lab Results  Component Value Date   VD25OH 22.4 (L) 09/22/2019   Not on  Replacement   Estrogen deficiency       Relevant Orders   DG Bone Density       Return in about 2 months (around 09/13/2020) for Medication follow-up.    Huston Foley Michale Emmerich, FNP-BC Primary [facility] at [facility] [place], [place] [address] Ph.  [phone] Fax [phone]

## 2020-07-14 NOTE — Patient Instructions (Addendum)
Health Maintenance, Female Adopting a healthy lifestyle and getting preventive care are important in promoting health and wellness. Ask your health care provider about:  The right schedule for you to have regular tests and exams.  Things you can do on your own to prevent diseases and keep yourself healthy. What should I know about diet, weight, and exercise? Eat a healthy diet   Eat a diet that includes plenty of vegetables, fruits, low-fat dairy products, and lean protein.  Do not eat a lot of foods that are high in solid fats, added sugars, or sodium. Maintain a healthy weight Body mass index (BMI) is used to identify weight problems. It estimates body fat based on height and weight. Your health care provider can help determine your BMI and help you achieve or maintain a healthy weight. Get regular exercise Get regular exercise. This is one of the most important things you can do for your health. Most adults should:  Exercise for at least 150 minutes each week. The exercise should increase your heart rate and make you sweat (moderate-intensity exercise).  Do strengthening exercises at least twice a week. This is in addition to the moderate-intensity exercise.  Spend less time sitting. Even light physical activity can be beneficial. Watch cholesterol and blood lipids Have your blood tested for lipids and cholesterol at 63 years of age, then have this test every 5 years. Have your cholesterol levels checked more often if:  Your lipid or cholesterol levels are high.  You are older than 63 years of age.  You are at high risk for heart disease. What should I know about cancer screening? Depending on your health history and family history, you may need to have cancer screening at various ages. This may include screening for:  Breast cancer.  Cervical cancer.  Colorectal cancer.  Skin cancer.  Lung cancer. What should I know about heart disease, diabetes, and high blood  pressure? Blood pressure and heart disease  High blood pressure causes heart disease and increases the risk of stroke. This is more likely to develop in people who have high blood pressure readings, are of African descent, or are overweight.  Have your blood pressure checked: ? Every 3-5 years if you are 54-65 years of age. ? Every year if you are 58 years old or older. Diabetes Have regular diabetes screenings. This checks your fasting blood sugar level. Have the screening done:  Once every three years after age 48 if you are at a normal weight and have a low risk for diabetes.  More often and at a younger age if you are overweight or have a high risk for diabetes. What should I know about preventing infection? Hepatitis B If you have a higher risk for hepatitis B, you should be screened for this virus. Talk with your health care provider to find out if you are at risk for hepatitis B infection. Hepatitis C Testing is recommended for:  Everyone born from [birth year] through 1964.  Anyone with known risk factors for hepatitis C. Sexually transmitted infections (STIs)  Get screened for STIs, including gonorrhea and chlamydia, if: ? You are sexually active and are younger than 63 years of age. ? You are older than 63 years of age and your health care provider tells you that you are at risk for this type of infection. ? Your sexual activity has changed since you were last screened, and you are at increased risk for chlamydia or gonorrhea. Ask your health care  provider if you are at risk.  Ask your health care provider about whether you are at high risk for HIV. Your health care provider may recommend a prescription medicine to help prevent HIV infection. If you choose to take medicine to prevent HIV, you should first get tested for HIV. You should then be tested every 3 months for as long as you are taking the medicine. Pregnancy  If you are about to stop having your period (premenopausal) and  you may become pregnant, seek counseling before you get pregnant.  Take 400 to 800 micrograms (mcg) of folic acid every day if you become pregnant.  Ask for birth control (contraception) if you want to prevent pregnancy. Osteoporosis and menopause Osteoporosis is a disease in which the bones lose minerals and strength with aging. This can result in bone fractures. If you are 78 years old or older, or if you are at risk for osteoporosis and fractures, ask your health care provider if you should:  Be screened for bone loss.  Take a calcium or vitamin D supplement to lower your risk of fractures.  Be given hormone replacement therapy (HRT) to treat symptoms of menopause. Follow these instructions at home: Lifestyle  Do not use any products that contain nicotine or tobacco, such as cigarettes, e-cigarettes, and chewing tobacco. If you need help quitting, ask your health care provider.  Do not use street drugs.  Do not share needles.  Ask your health care provider for help if you need support or information about quitting drugs. Alcohol use  Do not drink alcohol if: ? Your health care provider tells you not to drink. ? You are pregnant, may be pregnant, or are planning to become pregnant.  If you drink alcohol: ? Limit how much you use to 0-1 drink a day. ? Limit intake if you are breastfeeding.  Be aware of how much alcohol is in your drink. In the [place] one drink equals one 12 oz bottle of beer (355 mL), one 5 oz glass of wine (148 mL), or one 1 oz glass of hard liquor (44 mL). General instructions  Schedule regular health, dental, and eye exams.  Stay current with your vaccines.  Tell your health care provider if: ? You often feel depressed. ? You have ever been abused or do not feel safe at home. Summary  Adopting a healthy lifestyle and getting preventive care are important in promoting health and wellness.  Follow your health care provider's instructions about healthy  diet, exercising, and getting tested or screened for diseases.  Follow your health care provider's instructions on monitoring your cholesterol and blood pressure. This information is not intended to replace advice given to you by your health care provider. Make sure you discuss any questions you have with your health care provider. Document Revised: 08/07/2018 Document Reviewed: 08/07/2018 Elsevier Patient Education  [employer].   If you have lab work done today you will be contacted with your lab results within the next 2 weeks.  If you have not heard from [place] then please contact us. The fastest way to get your results is to register for My Chart.   IF you received an x-ray today, you will receive an invoice from [facility]. Please contact [facility] at [phone] with questions or concerns regarding your invoice.   IF you received labwork today, you will receive an invoice from [facility]. Please contact [facility] at [phone] with questions or concerns regarding your invoice.   Our billing staff  will not be able to assist you with questions regarding bills from these companies.  You will be contacted with the lab results as soon as they are available. The fastest way to get your results is to activate your My Chart account. Instructions are located on the last page of this paperwork. If you have not heard from [place] regarding the results in 2 weeks, please contact this office.

## 2020-07-15 LAB — LIPID PANEL
Chol/HDL Ratio: 3.4 ratio (ref 0.0–4.4)
Cholesterol, Total: 175 mg/dL (ref 100–199)
HDL: 52 mg/dL (ref >39)
LDL Chol Calc (NIH): 88 mg/dL (ref 0–99)
Triglycerides: 208 mg/dL — ABNORMAL HIGH (ref 0–149)
VLDL Cholesterol Cal: 35 mg/dL (ref 5–40)

## 2020-07-15 LAB — VITAMIN D 25 HYDROXY (VIT D DEFICIENCY, FRACTURES): Vit D, 25-Hydroxy: 16.4 ng/mL — ABNORMAL LOW (ref 30.0–100.0)

## 2020-07-15 LAB — TSH: TSH: 1.65 u[IU]/mL (ref 0.450–4.500)

## 2020-07-20 ENCOUNTER — Other Ambulatory Visit (HOSPITAL_COMMUNITY): Payer: Self-pay | Admitting: Cardiology

## 2020-07-20 MED FILL — ENTRESTO 49 MG-51 MG TABLET: 49-51 | 30 days supply | Qty: 60 | Fill #0

## 2020-08-10 ENCOUNTER — Other Ambulatory Visit: Payer: Self-pay | Admitting: Family Medicine

## 2020-08-10 DIAGNOSIS — E039 Hypothyroidism, unspecified: Secondary | ICD-10-CM

## 2020-08-10 MED FILL — CARVEDILOL 12.5 MG TABLET: 12.5 | 90 days supply | Qty: 180 | Fill #1

## 2020-08-10 MED FILL — TRULICITY 3 MG/0.5ML SOPN: 3 | 28 days supply | Qty: 2 | Fill #1

## 2020-08-10 MED FILL — LEVOTHYROXINE 50 MCG TABLET: 50 | 90 days supply | Qty: 90 | Fill #0

## 2020-08-10 MED FILL — REPATHA SURECLICK 140 MG/ML: 140 | 28 days supply | Qty: 2 | Fill #3

## 2020-08-11 ENCOUNTER — Encounter: Payer: Self-pay | Admitting: Family Medicine

## 2020-08-11 MED FILL — ENTRESTO 49 MG-51 MG TABLET: 49-51 | 30 days supply | Qty: 60 | Fill #1

## 2020-08-12 ENCOUNTER — Encounter: Payer: Self-pay | Admitting: Family Medicine

## 2020-08-12 ENCOUNTER — Other Ambulatory Visit: Payer: Self-pay

## 2020-08-12 ENCOUNTER — Telehealth (INDEPENDENT_AMBULATORY_CARE_PROVIDER_SITE_OTHER): Payer: Managed Care, Other (non HMO) | Admitting: Family Medicine

## 2020-08-12 DIAGNOSIS — N898 Other specified noninflammatory disorders of vagina: Secondary | ICD-10-CM

## 2020-08-12 MED ORDER — CEPHALEXIN 500 MG PO CAPS: 500.0000 mg | ORAL_CAPSULE | Freq: Three times a day (TID) | ORAL | 0 refills | Status: DC

## 2020-08-12 MED ORDER — CEPHALEXIN 500 MG PO CAPS: 500.0000 mg | ORAL_CAPSULE | Freq: Three times a day (TID) | ORAL | 0 refills | Status: AC

## 2020-08-12 MED ORDER — DOXYCYCLINE HYCLATE 100 MG PO CAPS: 100.0000 mg | ORAL_CAPSULE | Freq: Two times a day (BID) | ORAL | 0 refills | Status: AC

## 2020-08-12 MED ORDER — DOXYCYCLINE HYCLATE 100 MG PO CAPS: 100.0000 mg | ORAL_CAPSULE | Freq: Two times a day (BID) | ORAL | 0 refills | Status: DC

## 2020-08-12 NOTE — Progress Notes (Signed)
Virtual Visit Note  I connected with patient on 08/12/20 at 1449 by Epic video visit and verified that I am speaking with the correct person using two identifiers. Terri Heath is currently located at home and no family members are currently with them during visit. The provider, Laurita Quint Tykeria Wawrzyniak, FNP is located in their office at time of visit.  I discussed the limitations, risks, security and privacy concerns of performing an evaluation and management service by telephone and the availability of in person appointments. I also discussed with the patient that there may be a patient responsible charge related to this service. The patient expressed understanding and agreed to proceed.   I provided 20 minutes of non-face-to-face time during this encounter.  Chief Complaint  Patient presents with  . boil on pubic area     Fatigue, chills and chills, pain at pelvis area , hx of boils previously on stomach 2 yrs ago, this time lower with similar symptoms     HPI ? Boil to pubic area Keeping clean and using antibiotic ointment Became painful yesterday  Warm compresses Using Tylenol for pain Unable to visualize the area This has happened in the past    Allergies  Allergen Reactions  . Amoxicillin Other (See Comments)    Dizziness, abdominal pain  . Adhesive [Tape] Rash  . Exenatide Other (See Comments)    Flu-like symptoms  . Invokana [Canagliflozin] Other (See Comments)    Yeast infection/dehydration  . Jardiance [Empagliflozin] Other (See Comments)    Yeast infections and dehydration  . Lisinopril Other (See Comments)    cramping  . Other Diarrhea and Other (See Comments)    Kale=food  Per patient, it causes GI bleeding   . Statins Other (See Comments)    Leg cramps    Prior to Admission medications   Medication Sig Start Date End Date Taking? Authorizing Provider  albuterol (PROAIR HFA) 108 (90 Base) MCG/ACT inhaler Inhale 2 puffs into the lungs every 6 (six) hours  as needed. wheezing 02/21/19  Yes Rigoberto Noel, MD  aspirin 81 MG chewable tablet TAKE 1 TABLET BY MOUTH DAILY. 10/14/19  Yes Larey Dresser, MD  bismuth subsalicylate (PEPTO BISMOL) 262 MG chewable tablet Chew 524 mg by mouth as needed for indigestion or diarrhea or loose stools.    Yes [provider]  carvedilol (COREG) 12.5 MG tablet TAKE 1 TABLET BY MOUTH 2 TIMES A DAY. 04/29/20  Yes Larey Dresser, MD  cetirizine (ZYRTEC) 10 MG tablet Take 10 mg by mouth as needed.    Yes [provider]  diphenoxylate-atropine (LOMOTIL) 2.5-0.025 MG tablet Take 1 tablet by mouth 4 (four) times daily as needed for diarrhea or loose stools. 01/15/20  Yes Lamptey, Myrene Galas, MD  Dulaglutide (TRULICITY) 3 ZD/6.3OV SOPN Inject 3 mg into the skin once a week. 06/28/20  Yes Philemon Kingdom, MD  ENTRESTO 49-51 MG TAKE 1 TABLET BY MOUTH 2 TIMES DAILY. 07/20/20  Yes Larey Dresser, MD  Evolocumab (REPATHA SURECLICK) 564 MG/ML SOAJ Inject 1 pen into the skin every 14 (fourteen) days. 05/05/20  Yes Larey Dresser, MD  fluticasone [facility]) 50 MCG/ACT nasal spray Place 2 sprays into both nostrils daily as needed for allergies or rhinitis.   Yes [provider]  furosemide (LASIX) 40 MG tablet TAKE 1 TABLET (40 MG TOTAL) BY MOUTH 2 TIMES DAILY. 06/22/20  Yes Jettie Booze, MD  glucose blood (FREESTYLE LITE) test strip Use as instructed 2x  a day 10/02/19  Yes Philemon Kingdom, MD  glucose blood test strip  10/02/19  Yes [provider]  Insulin Degludec (TRESIBA FLEXTOUCH) 200 UNIT/ML SOPN Inject 40 Units into the skin daily. 10/02/19  Yes Philemon Kingdom, MD  Insulin Pen Needle 32G X 4 MM MISC Use 1x a day 10/02/19  Yes Philemon Kingdom, MD  levothyroxine (SYNTHROID) 50 MCG tablet TAKE 1 TABLET BY MOUTH DAILY BEFORE BREAKFAST. 07/14/20  Yes Lataja Newland, Laurita Quint, FNP  magnesium oxide (MAG-OX) 400 MG tablet Take 400 mg by mouth at bedtime as needed (for cramps).   Yes [provider]  metFORMIN (GLUCOPHAGE) 1000 MG tablet TAKE 1 TABLET (1,000 MG TOTAL) BY MOUTH TWICE A DAY WITH A MEAL. 04/19/20  Yes Philemon Kingdom, MD  Multiple Vitamin (MULITIVITAMIN WITH MINERALS) TABS Take 1 tablet by mouth daily with breakfast.   Yes [provider]  spironolactone (ALDACTONE) 25 MG tablet Take 1 tablet (25 mg total) by mouth daily. 08/05/19 11/03/19  Larey Dresser, MD    Past Medical History:  Diagnosis Date  . Allergy   . Asthma   . Bilateral shoulder pain   . Borderline hypertension   . Bronchitis   . CAD (coronary artery disease)   . Chronic systolic CHF (congestive heart failure) ([place])   . Diabetes mellitus    Diagnosed in 2000  . History of MI (myocardial infarction)   . Hyperlipidemia   . Hypertension   . Hypothyroidism   . MGUS (monoclonal gammopathy of unknown significance)   . Mitral regurgitation   . Obesity   . Sleep apnea   . Statin intolerance     Past Surgical History:  Procedure Laterality Date  . BREATH TEK H PYLORI  09/18/2011   Procedure: BREATH TEK H PYLORI;  Surgeon: Shann Medal, MD;  Location: [facility];  Service: General;  Laterality: N/A;  . CESAREAN SECTION     x 3  . LAPAROSCOPIC APPENDECTOMY N/A 10/03/2017   Procedure: APPENDECTOMY LAPAROSCOPIC;  Surgeon: Leighton Ruff, MD;  Location: [facility];  Service: General;  Laterality: N/A;  . RIGHT/LEFT HEART CATH AND CORONARY ANGIOGRAPHY N/A 02/12/2018   Procedure: RIGHT/LEFT HEART CATH AND CORONARY ANGIOGRAPHY;  Surgeon: Jettie Booze, MD;  Location: [facility];  Service: Cardiovascular;  Laterality: N/A;  . TUBAL LIGATION      Social History   Tobacco Use  . Smoking status: Former Smoker    Packs/day: 1.00    Years: 15.00    Pack years: 15.00    Types: Cigarettes    Quit date: 01/28/1990    Years since quitting: 30.5  . Smokeless tobacco: Never Used  Substance Use Topics  . Alcohol use: No    Family History  Problem Relation Age of Onset  . Alcohol abuse  Mother   . Arthritis Mother   . Diabetes Mother   . Sleep apnea Mother   . Anxiety disorder Mother   . Eating disorder Mother   . Obesity Mother   . Alcohol abuse Father   . Heart disease Father   . Hypertension Father   . Stroke Father   . Cancer Father   . Sleep apnea Sister   . Breast cancer Neg Hx     Review of Systems  Constitutional: Negative for chills, diaphoresis, fever, malaise/fatigue and weight loss.  Eyes: Negative for blurred vision and double vision.  Respiratory: Negative for cough, shortness of breath and wheezing.   Cardiovascular: Negative for chest pain,  palpitations and leg swelling.  Gastrointestinal: Negative for abdominal pain, blood in stool, constipation, diarrhea, heartburn, nausea and vomiting.  Genitourinary: Negative for dysuria, frequency and hematuria.  Musculoskeletal: Negative for back pain and joint pain.  Skin: Negative for rash.       Cyst to pelvic area  Neurological: Negative for dizziness, weakness and headaches.    Objective  Constitutional:      General: She is not in acute distress.    Appearance: Normal appearance. She is not ill-appearing.   Pulmonary:     Effort: Pulmonary effort is normal. No respiratory distress.  Neurological:     Mental Status: She is alert and oriented to person, place, and time.  Psychiatric:        Mood and Affect: Mood normal.        Behavior: Behavior normal.     ASSESSMENT and PLAN  Problem List Items Addressed This Visit   None   Visit Diagnoses    Vaginal cyst    -  Primary   Relevant Medications   cephALEXin (KEFLEX) 500 MG capsule tid x 7 days   doxycycline (VIBRAMYCIN) 100 MG capsule bid x 7 days  Strict RTC and ED precautions provided R/se/b of medications provided Instructions on care include keeping area clean and dry, using tylenol for pain, Continuing to use warm compresses multiple times per day.       Return in about 1 week (around 08/19/2020).    The above assessment  and management plan was discussed with the patient. The patient verbalized understanding of and has agreed to the management plan. Patient is aware to call the clinic if symptoms persist or worsen. Patient is aware when to return to the clinic for a follow-up visit. Patient educated on when it is appropriate to go to the emergency department.     Huston Foley Lando Alcalde, FNP-BC Primary Care at [place], [place] [address] Ph.  [phone] Fax [phone]

## 2020-08-12 NOTE — Patient Instructions (Addendum)
Skin Abscess  A skin abscess is an infected area of your skin that contains pus and other material. An abscess can happen in any part of your body. Some abscesses break open (rupture) on their own. Most continue to get worse unless they are treated. The infection can spread deeper into the body and into your blood, which can make you feel sick. A skin abscess is caused by germs that enter the skin through a cut or scrape. It can also be caused by blocked oil and sweat glands or infected hair follicles. This condition is usually treated by:  Draining the pus.  Taking antibiotic medicines.  Placing a warm, wet washcloth over the abscess. Follow these instructions at home: Medicines   Take over-the-counter and prescription medicines only as told by your doctor.  If you were prescribed an antibiotic medicine, take it as told by your doctor. Do not stop taking the antibiotic even if you start to feel better. Abscess care   If you have an abscess that has not drained, place a warm, clean, wet washcloth over the abscess several times a day. Do this as told by your doctor.  Follow instructions from your doctor about how to take care of your abscess. Make sure you: ? Cover the abscess with a bandage (dressing). ? Change your bandage or gauze as told by your doctor. ? Wash your hands with soap and water before you change the bandage or gauze. If you cannot use soap and water, use hand sanitizer.  Check your abscess every day for signs that the infection is getting worse. Check for: ? More redness, swelling, or pain. ? More fluid or blood. ? Warmth. ? More pus or a bad smell. General instructions  To avoid spreading the infection: ? Do not share personal care items, towels, or hot tubs with others. ? Avoid making skin-to-skin contact with other people.  Keep all follow-up visits as told by your doctor. This is important. Contact a doctor if:  You have more redness, swelling, or  pain around your abscess.  You have more fluid or blood coming from your abscess.  Your abscess feels warm when you touch it.  You have more pus or a bad smell coming from your abscess.  You have a fever.  Your muscles ache.  You have chills.  You feel sick. Get help right away if:  You have very bad (severe) pain.  You see red streaks on your skin spreading away from the abscess. Summary  A skin abscess is an infected area of your skin that contains pus and other material.  The abscess is caused by germs that enter the skin through a cut or scrape. It can also be caused by blocked oil and sweat glands or infected hair follicles.  Follow your doctor's instructions on caring for your abscess, taking medicines, preventing infections, and keeping follow-up visits. This information is not intended to replace advice given to you by your health care provider. Make sure you discuss any questions you have with your health care provider. Document Revised: 03/20/2019 Document Reviewed: 09/27/2017 Elsevier Patient Education  [employer].   If you have lab work done today you will be contacted with your lab results within the next 2 weeks.  If you have not heard from [place] then please contact us. The fastest way to get your results is to register for My Chart.   IF you received an x-ray today, you will receive an invoice from  [facility]. Please contact [facility] at [phone] with questions or concerns regarding your invoice.   IF you received labwork today, you will receive an invoice from [facility]. Please contact [facility] at [phone] with questions or concerns regarding your invoice.   Our billing staff will not be able to assist you with questions regarding bills from these companies.  You will be contacted with the lab results as soon as they are available. The fastest way to get your results is to activate your My Chart account. Instructions are  located on the last page of this paperwork. If you have not heard from [place] regarding the results in 2 weeks, please contact this office.

## 2020-08-16 NOTE — Telephone Encounter (Signed)
Pt had virtual visit 08/12/2020 no documentation continued here

## 2020-08-16 NOTE — Progress Notes (Signed)
Pt has been sch

## 2020-08-25 ENCOUNTER — Encounter (HOSPITAL_COMMUNITY): Payer: 59 | Admitting: Cardiology

## 2020-09-09 ENCOUNTER — Other Ambulatory Visit: Payer: Self-pay

## 2020-09-09 ENCOUNTER — Other Ambulatory Visit: Payer: 59

## 2020-09-09 ENCOUNTER — Inpatient Hospital Stay: Payer: Managed Care, Other (non HMO) | Attending: Hematology and Oncology

## 2020-09-09 DIAGNOSIS — Z7982 Long term (current) use of aspirin: Secondary | ICD-10-CM | POA: Insufficient documentation

## 2020-09-09 DIAGNOSIS — Z79899 Other long term (current) drug therapy: Secondary | ICD-10-CM | POA: Insufficient documentation

## 2020-09-09 DIAGNOSIS — D472 Monoclonal gammopathy: Secondary | ICD-10-CM | POA: Insufficient documentation

## 2020-09-09 LAB — CMP (CANCER CENTER ONLY)
ALT: 15 U/L (ref 0–44)
AST: 13 U/L — ABNORMAL LOW (ref 15–41)
Albumin: 3.1 g/dL — ABNORMAL LOW (ref 3.5–5.0)
Alkaline Phosphatase: 82 U/L (ref 38–126)
Anion gap: 10 (ref 5–15)
BUN: 17 mg/dL (ref 8–23)
CO2: 27 mmol/L (ref 22–32)
Calcium: 9.6 mg/dL (ref 8.9–10.3)
Chloride: 100 mmol/L (ref 98–111)
Creatinine: 1.02 mg/dL — ABNORMAL HIGH (ref 0.44–1.00)
GFR, Estimated: >60 mL/min (ref >60)
Glucose, Bld: 351 mg/dL — ABNORMAL HIGH (ref 70–99)
Potassium: 4.5 mmol/L (ref 3.5–5.1)
Sodium: 137 mmol/L (ref 135–145)
Total Bilirubin: 0.5 mg/dL (ref 0.3–1.2)
Total Protein: 7.8 g/dL (ref 6.5–8.1)

## 2020-09-09 LAB — CBC WITH DIFFERENTIAL (CANCER CENTER ONLY)
Abs Immature Granulocytes: 0.03 10*3/uL (ref 0.00–0.07)
Basophils Absolute: 0.1 10*3/uL (ref 0.0–0.1)
Basophils Relative: 1 %
Eosinophils Absolute: 0.3 10*3/uL (ref 0.0–0.5)
Eosinophils Relative: 3 %
HCT: 40.5 % (ref 36.0–46.0)
Hemoglobin: 13.4 g/dL (ref 12.0–15.0)
Immature Granulocytes: 0 %
Lymphocytes Relative: 29 %
Lymphs Abs: 2.5 10*3/uL (ref 0.7–4.0)
MCH: 27.5 pg (ref 26.0–34.0)
MCHC: 33.1 g/dL (ref 30.0–36.0)
MCV: 83 fL (ref 80.0–100.0)
Monocytes Absolute: 0.5 10*3/uL (ref 0.1–1.0)
Monocytes Relative: 6 %
Neutro Abs: 5 10*3/uL (ref 1.7–7.7)
Neutrophils Relative %: 61 %
Platelet Count: 303 10*3/uL (ref 150–400)
RBC: 4.88 MIL/uL (ref 3.87–5.11)
RDW: 13.1 % (ref 11.5–15.5)
WBC Count: 8.4 10*3/uL (ref 4.0–10.5)
nRBC: 0 % (ref 0.0–0.2)

## 2020-09-10 LAB — KAPPA/LAMBDA LIGHT CHAINS
Kappa free light chain: 39.9 mg/L — ABNORMAL HIGH (ref 3.3–19.4)
Kappa, lambda light chain ratio: 1.74 — ABNORMAL HIGH (ref 0.26–1.65)
Lambda free light chains: 22.9 mg/L (ref 5.7–26.3)

## 2020-09-13 LAB — MULTIPLE MYELOMA PANEL, SERUM
Albumin SerPl Elph-Mcnc: 3.3 g/dL (ref 2.9–4.4)
Albumin/Glob SerPl: 0.9 (ref 0.7–1.7)
Alpha 1: 0.2 g/dL (ref 0.0–0.4)
Alpha2 Glob SerPl Elph-Mcnc: 1 g/dL (ref 0.4–1.0)
B-Globulin SerPl Elph-Mcnc: 1.1 g/dL (ref 0.7–1.3)
Gamma Glob SerPl Elph-Mcnc: 1.7 g/dL (ref 0.4–1.8)
Globulin, Total: 3.9 g/dL (ref 2.2–3.9)
IgA: 383 mg/dL — ABNORMAL HIGH (ref 87–352)
IgG (Immunoglobin G), Serum: 1168 mg/dL (ref 586–1602)
IgM (Immunoglobulin M), Srm: 738 mg/dL — ABNORMAL HIGH (ref 26–217)
M Protein SerPl Elph-Mcnc: 0.5 g/dL — ABNORMAL HIGH
Total Protein ELP: 7.2 g/dL (ref 6.0–8.5)

## 2020-09-15 NOTE — Progress Notes (Signed)
MyChart virtual visit   Patient Care Team: Just, Laurita Quint, FNP as PCP - General (Family Medicine) Jettie Booze, MD as PCP - Cardiology (Cardiology) Rigoberto Noel, MD as Consulting Physician (Pulmonary Disease) Alphonsa Overall, MD (General Surgery)  DIAGNOSIS:    ICD-10-CM   1. MGUS (monoclonal gammopathy of unknown significance)  D47.2 CBC with Differential (Cancer Center Only)    CMP ([place] only)    Kappa/lambda light chains    Multiple Myeloma Panel (SPEP&IFE w/QIG)    Beta 2 microglobulin, serum    CHIEF COMPLIANT: Follow-up of MGUS  INTERVAL HISTORY: Terri Heath is a 65 y.o. with above-mentioned history of MGUS. Labs on 09/09/20 showed kappa light chain 1.74, ratio 1.74, m protein 0.5. She presents to the clinic today for follow-up.   She has upper respiratory tract infection symptoms but otherwise doing quite well.  ALLERGIES:  is allergic to amoxicillin, adhesive [tape], exenatide, invokana [canagliflozin], jardiance [empagliflozin], lisinopril, other, and statins.  MEDICATIONS:  Current Outpatient Medications  Medication Sig Dispense Refill  . albuterol (PROAIR HFA) 108 (90 Base) MCG/ACT inhaler Inhale 2 puffs into the lungs every 6 (six) hours as needed. wheezing 18 g 2  . aspirin 81 MG chewable tablet TAKE 1 TABLET BY MOUTH DAILY. 90 tablet 3  . bismuth subsalicylate (PEPTO BISMOL) 262 MG chewable tablet Chew 524 mg by mouth as needed for indigestion or diarrhea or loose stools.     . carvedilol (COREG) 12.5 MG tablet TAKE 1 TABLET BY MOUTH 2 TIMES A DAY. 180 tablet 3  . cetirizine (ZYRTEC) 10 MG tablet Take 10 mg by mouth as needed.     . diphenoxylate-atropine (LOMOTIL) 2.5-0.025 MG tablet Take 1 tablet by mouth 4 (four) times daily as needed for diarrhea or loose stools. 30 tablet 0  . Evolocumab (REPATHA SURECLICK) 919 MG/ML SOAJ Inject 1 pen into the skin every 14 (fourteen) days. 2 mL 11  . furosemide (LASIX) 40 MG tablet TAKE 1 TABLET (40 MG  TOTAL) BY MOUTH 2 TIMES DAILY. 180 tablet 3  . glucose blood test strip     . insulin detemir (LEVEMIR) 100 UNIT/ML injection Inject 0.1 mLs (10 Units total) into the skin daily. 10 mL 11  . Insulin Pen Needle 32G X 4 MM MISC Use 1x a day 100 each 3  . levothyroxine (SYNTHROID) 50 MCG tablet TAKE 1 TABLET BY MOUTH DAILY BEFORE BREAKFAST. 90 tablet 3  . magnesium oxide (MAG-OX) 400 MG tablet Take 400 mg by mouth at bedtime as needed (for cramps).    . metFORMIN (GLUCOPHAGE) 1000 MG tablet TAKE 1 TABLET (1,000 MG TOTAL) BY MOUTH TWICE A DAY WITH A MEAL. 180 tablet 1  . Multiple Vitamin (MULITIVITAMIN WITH MINERALS) TABS Take 1 tablet by mouth daily with breakfast.    . sacubitril-valsartan (ENTRESTO) 49-51 MG Take 1 tablet by mouth 2 (two) times daily. 60 tablet 0  . spironolactone (ALDACTONE) 25 MG tablet Take 1 tablet (25 mg total) by mouth daily. 90 tablet 3   No current facility-administered medications for this visit.    PHYSICAL EXAMINATION: ECOG PERFORMANCE STATUS: 1 - Symptomatic but completely ambulatory  There were no vitals filed for this visit. There were no vitals filed for this visit.  LABORATORY DATA:  I have reviewed the data as listed CMP Latest Ref Rng & Units 09/09/2020 05/19/2020 01/15/2020  Glucose 70 - 99 mg/dL 351(H) 322(H) 279(H)  BUN 8 - 23 mg/dL 17 13 12   Creatinine 0.44 -  1.00 mg/dL 1.02(H) 0.81 0.68  Sodium 135 - 145 mmol/L 137 133(L) 132(L)  Potassium 3.5 - 5.1 mmol/L 4.5 4.1 3.8  Chloride 98 - 111 mmol/L 100 98 101  CO2 22 - 32 mmol/L 27 23 19(L)  Calcium 8.9 - 10.3 mg/dL 9.6 8.9 8.2(L)  Total Protein 6.5 - 8.1 g/dL 7.8 - 7.8  Total Bilirubin 0.3 - 1.2 mg/dL 0.5 - 0.9  Alkaline Phos 38 - 126 U/L 82 - 73  AST 15 - 41 U/L 13(L) - 34  ALT 0 - 44 U/L 15 - 30    Lab Results  Component Value Date   WBC 8.4 09/09/2020   HGB 13.4 09/09/2020   HCT 40.5 09/09/2020   MCV 83.0 09/09/2020   PLT 303 09/09/2020   NEUTROABS 5.0 09/09/2020    ASSESSMENT & PLAN:   MGUS (monoclonal gammopathy of unknown significance) Lab review 01/24/2018 M protein0.5 g IgM kappa light chain restricted,IgG 1723, IgA 540, IgM 706,Hemoglobin 15 g Bone survey 03/14/2018: Negative 09/05/2018: M protein 0.5 g IgM kappa light chain restricted,IgG 1416, kappa 40.1, KL ratio 2,Hemoglobin 13.6, creatinine 0.81 09/10/2019: M protein 0.4 g, IgM kappa light chain, kappa 34.8, lambda 17.1, ratio 2.04, hemoglobin 13.9, creatinine 0.99 09/09/2020: M protein 0.5 g, IgM 738 (IgM kappa) creatinine 1.02, calcium 9.6, hemoglobin 13.4, kappa 39.9: Ratio 1.74  I discussed the results of the blood work  There is been no significant change in the monoclonal protein. Return to clinic in 1 year with labs done ahead of time.    Orders Placed This Encounter  Procedures  . CBC with Differential (Cancer Center Only)    Standing Status:   Future    Standing Expiration Date:   09/16/2021  . CMP ([place] only)    Standing Status:   Future    Standing Expiration Date:   09/16/2021  . Kappa/lambda light chains    Standing Status:   Future    Standing Expiration Date:   09/16/2021  . Multiple Myeloma Panel (SPEP&IFE w/QIG)    Standing Status:   Future    Standing Expiration Date:   09/16/2021  . Beta 2 microglobulin, serum    Standing Status:   Future    Standing Expiration Date:   09/16/2021   The patient has a good understanding of the overall plan. she agrees with it. she will call with any problems that may develop before the next visit here.  Total time spent: 20 mins including face to face time and time spent for planning, charting and coordination of care  Nicholas Lose, MD 09/16/2020  I, Cloyde Reams Dorshimer, am acting as scribe for Dr. Nicholas Lose.  I have reviewed the above documentation for accuracy and completeness, and I agree with the above.

## 2020-09-16 ENCOUNTER — Ambulatory Visit: Payer: Managed Care, Other (non HMO) | Admitting: Family Medicine

## 2020-09-16 ENCOUNTER — Inpatient Hospital Stay (HOSPITAL_BASED_OUTPATIENT_CLINIC_OR_DEPARTMENT_OTHER): Payer: Managed Care, Other (non HMO) | Admitting: Hematology and Oncology

## 2020-09-16 ENCOUNTER — Encounter: Payer: Self-pay | Admitting: Family Medicine

## 2020-09-16 ENCOUNTER — Other Ambulatory Visit: Payer: Self-pay | Admitting: Family Medicine

## 2020-09-16 ENCOUNTER — Other Ambulatory Visit (HOSPITAL_COMMUNITY): Payer: Self-pay | Admitting: Cardiology

## 2020-09-16 ENCOUNTER — Other Ambulatory Visit: Payer: Self-pay

## 2020-09-16 VITALS — BP 122/80 | HR 93 | Temp 97.3°F | Ht 67.0 in | Wt 292.0 lb

## 2020-09-16 DIAGNOSIS — I5022 Chronic systolic (congestive) heart failure: Secondary | ICD-10-CM

## 2020-09-16 DIAGNOSIS — D472 Monoclonal gammopathy: Secondary | ICD-10-CM | POA: Diagnosis not present

## 2020-09-16 DIAGNOSIS — E1159 Type 2 diabetes mellitus with other circulatory complications: Secondary | ICD-10-CM

## 2020-09-16 DIAGNOSIS — I739 Peripheral vascular disease, unspecified: Secondary | ICD-10-CM

## 2020-09-16 DIAGNOSIS — E1165 Type 2 diabetes mellitus with hyperglycemia: Secondary | ICD-10-CM | POA: Diagnosis not present

## 2020-09-16 LAB — POCT GLYCOSYLATED HEMOGLOBIN (HGB A1C): Hemoglobin A1C: 14 % — AB (ref 4.0–5.6)

## 2020-09-16 MED ORDER — INSULIN DETEMIR 100 UNIT/ML ~~LOC~~ SOLN
10.0000 [IU] | Freq: Every day | SUBCUTANEOUS | 11 refills | Status: DC
Start: 2020-09-16 — End: 2020-09-20

## 2020-09-16 MED ORDER — INSULIN PEN NEEDLE 32G X 4 MM MISC: 3 refills | Status: DC

## 2020-09-16 MED ORDER — INSULIN DETEMIR 100 UNIT/ML ~~LOC~~ SOLN: 10.0000 [IU] | Freq: Every day | SUBCUTANEOUS | 11 refills | Status: DC

## 2020-09-16 MED FILL — ENTRESTO 49 MG-51 MG TABLET: 49-51 | 30 days supply | Qty: 60 | Fill #0

## 2020-09-16 MED FILL — TRULICITY 3 MG/0.5ML SOPN: 3 | 28 days supply | Qty: 2 | Fill #2

## 2020-09-16 MED FILL — REPATHA SURECLICK 140 MG/ML: 140 | 28 days supply | Qty: 2 | Fill #4

## 2020-09-16 NOTE — Progress Notes (Signed)
1/20/202211:49 AM  Arlie Solomons 1955/11/14, 65 y.o., female [id number]  Chief Complaint  Patient presents with  . Diabetes    Follow up states tries to watch her diet     HPI:   Patient is a 65 y.o. female with past medical history significant for DM, PVD, CAD, sCHF, DR who presents today for follow up.  Last OV: 12/16 boil: Keflex and doxy x 7 days, warm compress Healed well Sons wedding in the fall  Husband bought a house 3 years ago to fix She recently moved in there Still moving slowly   DM [id number] at home BG Trulicity: 3mg  Metformin 2000mg  Is concerned the trulicity is making her lose weight Did not tolerate: Invokana, Jardiance, glipizide, actos and ozempic Lab Results  Component Value Date   HGBA1C 14.0 (A) 09/16/2020   HGBA1C 11.8 (A) 06/14/2020   HGBA1C 14.4 (H) 09/22/2019   Lab Results  Component Value Date   MICROALBUR 2.2 03/14/2016   LDLCALC 88 07/14/2020   CREATININE 1.02 (H) 09/09/2020    HTN BP Readings from Last 3 Encounters:  09/16/20 122/80  07/14/20 113/77  06/14/20 112/70   Wt Readings from Last 3 Encounters:  09/16/20 292 lb (132.5 kg)  07/14/20 296 lb (134.3 kg)  06/14/20 298 lb 12.8 oz (135.5 kg)      Depression screen [facility] 2/9 09/16/2020 08/12/2020 07/14/2020  Decreased Interest 0 0 0  Down, Depressed, Hopeless 1 0 0  PHQ - 2 Score 1 0 0  Altered sleeping - - -  Tired, decreased energy - - -  Change in appetite - - -  Feeling bad or failure about yourself  - - -  Trouble concentrating - - -  Moving slowly or fidgety/restless - - -  Suicidal thoughts - - -  PHQ-9 Score - - -  Difficult doing work/chores - - -  Some recent data might be hidden    Fall Risk  09/16/2020 08/12/2020 07/14/2020 04/11/2019 04/11/2019  Falls in the past year? 0 0 0 0 0  Number falls in past yr: 0 0 0 0 0  Injury with Fall? 0 0 0 0 0  Risk for fall due to : - - - - -  Risk for fall due to: Comment - - - - -  Follow up Falls evaluation  completed Falls evaluation completed Falls evaluation completed - Falls evaluation completed     Allergies  Allergen Reactions  . Amoxicillin Other (See Comments)    Dizziness, abdominal pain  . Adhesive [Tape] Rash  . Exenatide Other (See Comments)    Flu-like symptoms  . Invokana [Canagliflozin] Other (See Comments)    Yeast infection/dehydration  . Jardiance [Empagliflozin] Other (See Comments)    Yeast infections and dehydration  . Lisinopril Other (See Comments)    cramping  . Other Diarrhea and Other (See Comments)    Kale=food  Per patient, it causes GI bleeding   . Statins Other (See Comments)    Leg cramps    Prior to Admission medications   Medication Sig Start Date End Date Taking? Authorizing Provider  albuterol (PROAIR HFA) 108 (90 Base) MCG/ACT inhaler Inhale 2 puffs into the lungs every 6 (six) hours as needed. wheezing 02/21/19   Rigoberto Noel, MD  aspirin 81 MG chewable tablet TAKE 1 TABLET BY MOUTH DAILY. 10/14/19   Larey Dresser, MD  bismuth subsalicylate (PEPTO BISMOL) 262 MG chewable tablet Chew 524 mg by mouth as needed for indigestion or  diarrhea or loose stools.     [provider]  carvedilol (COREG) 12.5 MG tablet TAKE 1 TABLET BY MOUTH 2 TIMES A DAY. 04/29/20   Larey Dresser, MD  cetirizine (ZYRTEC) 10 MG tablet Take 10 mg by mouth as needed.     [provider]  diphenoxylate-atropine (LOMOTIL) 2.5-0.025 MG tablet Take 1 tablet by mouth 4 (four) times daily as needed for diarrhea or loose stools. 01/15/20   Lamptey, Myrene Galas, MD  Dulaglutide (TRULICITY) 3 VF/6.4PP SOPN Inject 3 mg into the skin once a week. 06/28/20   Philemon Kingdom, MD  Evolocumab (REPATHA SURECLICK) 295 MG/ML SOAJ Inject 1 pen into the skin every 14 (fourteen) days. 05/05/20   Larey Dresser, MD  fluticasone [facility]) 50 MCG/ACT nasal spray Place 2 sprays into both nostrils daily as needed for allergies or rhinitis.    [provider]  furosemide (LASIX)  40 MG tablet TAKE 1 TABLET (40 MG TOTAL) BY MOUTH 2 TIMES DAILY. 06/22/20   Jettie Booze, MD  glucose blood (FREESTYLE LITE) test strip Use as instructed 2x a day 10/02/19   Philemon Kingdom, MD  glucose blood test strip  10/02/19   [provider]  Insulin Degludec (TRESIBA FLEXTOUCH) 200 UNIT/ML SOPN Inject 40 Units into the skin daily. 10/02/19   Philemon Kingdom, MD  Insulin Pen Needle 32G X 4 MM MISC Use 1x a day 10/02/19   Philemon Kingdom, MD  levothyroxine (SYNTHROID) 50 MCG tablet TAKE 1 TABLET BY MOUTH DAILY BEFORE BREAKFAST. 07/14/20   Demitris Pokorny, Laurita Quint, FNP  magnesium oxide (MAG-OX) 400 MG tablet Take 400 mg by mouth at bedtime as needed (for cramps).    [provider]  metFORMIN (GLUCOPHAGE) 1000 MG tablet TAKE 1 TABLET (1,000 MG TOTAL) BY MOUTH TWICE A DAY WITH A MEAL. 04/19/20   Philemon Kingdom, MD  Multiple Vitamin (MULITIVITAMIN WITH MINERALS) TABS Take 1 tablet by mouth daily with breakfast.    [provider]  sacubitril-valsartan (ENTRESTO) 49-51 MG Take 1 tablet by mouth 2 (two) times daily. 09/16/20   Larey Dresser, MD  spironolactone (ALDACTONE) 25 MG tablet Take 1 tablet (25 mg total) by mouth daily. 08/05/19 11/03/19  Larey Dresser, MD    Past Medical History:  Diagnosis Date  . Allergy   . Asthma   . Bilateral shoulder pain   . Borderline hypertension   . Bronchitis   . CAD (coronary artery disease)   . Chronic systolic CHF (congestive heart failure) ([place])   . Diabetes mellitus    Diagnosed in 2000  . History of MI (myocardial infarction)   . Hyperlipidemia   . Hypertension   . Hypothyroidism   . MGUS (monoclonal gammopathy of unknown significance)   . Mitral regurgitation   . Obesity   . Sleep apnea   . Statin intolerance     Past Surgical History:  Procedure Laterality Date  . BREATH TEK H PYLORI  09/18/2011   Procedure: BREATH TEK H PYLORI;  Surgeon: Shann Medal, MD;  Location: [facility];  Service: General;   Laterality: N/A;  . CESAREAN SECTION     x 3  . LAPAROSCOPIC APPENDECTOMY N/A 10/03/2017   Procedure: APPENDECTOMY LAPAROSCOPIC;  Surgeon: Leighton Ruff, MD;  Location: [facility];  Service: General;  Laterality: N/A;  . RIGHT/LEFT HEART CATH AND CORONARY ANGIOGRAPHY N/A 02/12/2018   Procedure: RIGHT/LEFT HEART CATH AND CORONARY ANGIOGRAPHY;  Surgeon: Jettie Booze, MD;  Location: [facility];  Service: Cardiovascular;  Laterality: N/A;  . TUBAL LIGATION      Social History   Tobacco Use  . Smoking status: Former Smoker    Packs/day: 1.00    Years: 15.00    Pack years: 15.00    Types: Cigarettes    Quit date: 01/28/1990    Years since quitting: 30.6  . Smokeless tobacco: Never Used  Substance Use Topics  . Alcohol use: No    Family History  Problem Relation Age of Onset  . Alcohol abuse Mother   . Arthritis Mother   . Diabetes Mother   . Sleep apnea Mother   . Anxiety disorder Mother   . Eating disorder Mother   . Obesity Mother   . Alcohol abuse Father   . Heart disease Father   . Hypertension Father   . Stroke Father   . Cancer Father   . Sleep apnea Sister   . Breast cancer Neg Hx     Review of Systems  Constitutional: Negative for chills, fever and malaise/fatigue.  Eyes: Negative for blurred vision and double vision.  Respiratory: Negative for cough, shortness of breath and wheezing.   Cardiovascular: Negative for chest pain, palpitations and leg swelling.  Gastrointestinal: Negative for abdominal pain, blood in stool, constipation, diarrhea, heartburn, nausea and vomiting.  Genitourinary: Negative for dysuria, frequency and hematuria.  Musculoskeletal: Negative for back pain and joint pain.  Skin: Negative for rash.  Neurological: Negative for dizziness, weakness and headaches.     OBJECTIVE:  Today's Vitals   09/16/20 1120  BP: 122/80  Pulse: 93  Temp: (!) 97.3 F (36.3 C)  SpO2: 96%  Weight: 292 lb (132.5 kg)  Height: 5\' 7"  (1.702 m)    Body mass index is 45.73 kg/m.   Physical Exam Constitutional:      General: She is not in acute distress.    Appearance: Normal appearance. She is not ill-appearing.  HENT:     Head: Normocephalic.  Cardiovascular:     Rate and Rhythm: Normal rate and regular rhythm.     Pulses: Normal pulses.     Heart sounds: Normal heart sounds. No murmur heard. No friction rub. No gallop.   Pulmonary:     Effort: Pulmonary effort is normal. No respiratory distress.     Breath sounds: Normal breath sounds. No stridor. No wheezing, rhonchi or rales.  Abdominal:     General: Bowel sounds are normal.     Palpations: Abdomen is soft.     Tenderness: There is no abdominal tenderness.  Musculoskeletal:     Right lower leg: No edema.     Left lower leg: No edema.  Skin:    General: Skin is warm and dry.  Neurological:     Mental Status: She is alert and oriented to person, place, and time.  Psychiatric:        Mood and Affect: Mood normal.        Behavior: Behavior normal.     Results for orders placed or performed in visit on 09/16/20 (from the past 24 hour(s))  POCT glycosylated hemoglobin (Hb A1C)     Status: Abnormal   Collection Time: 09/16/20 11:41 AM  Result Value Ref Range   Hemoglobin A1C 14.0 (A) 4.0 - 5.6 %   HbA1c POC (<> result, manual entry)     HbA1c, POC (prediabetic range)     HbA1c, POC (controlled diabetic range)      No results found.   ASSESSMENT and PLAN  Problem List Items Addressed This Visit      Cardiovascular and Mediastinum   Chronic systolic congestive heart failure (HCC)     Endocrine   Poorly controlled type 2 diabetes mellitus with circulatory disorder (HCC) - Primary   Relevant Medications   insulin detemir (LEVEMIR) 100 UNIT/ML injection   Insulin Pen Needle 32G X 4 MM MISC   Other Relevant Orders   POCT glycosylated hemoglobin (Hb A1C) (Completed)   Microalbumin/Creatinine Ratio, Urine     Other   Morbid obesity, weight 368, BMI  57.7.   Relevant Medications   insulin detemir (LEVEMIR) 100 UNIT/ML injection   Claudication in peripheral vascular disease ([place])      Plan  Start with 10 units daily of Levemir  Daily take your Blood sugar in the AM  Increase the levemir every 3 days by 2 units for a goal of AM sugars (120-140)  Once at that goal levemir let me know what dose you are on  Continue Trulicity and Metformin   All other chronic conditions stable on current regimens   Continue to work on LFM   Return in about 5 weeks (around 10/21/2020) for virtual to follow up on medication changes.    Huston Foley Markell Sciascia, FNP-BC [facility] [place], [place] [address] Ph.  [phone] Fax [phone]

## 2020-09-16 NOTE — Patient Instructions (Addendum)
Start with 10 units daily of Levemir Daily take your Blood sugar in the AM Increase the levemir every 3 days by 2 units for a goal of AM sugars (120-140) Once at that goal levemir let me know what dose you are on Continue Trulicity and Metformin    Diabetes Mellitus and Standards of [place] with and managing diabetes (diabetes mellitus) can be complicated. Your diabetes treatment may be managed by a team of health care providers, including:  A physician who specializes in diabetes (endocrinologist). You might also have visits with a nurse practitioner or physician assistant.  Nurses.  A registered dietitian.  A certified diabetes care and education specialist.  An exercise specialist.  A pharmacist.  An eye doctor.  A foot specialist (podiatrist).  A dental care provider.  A primary care provider.  A mental health care provider. How to manage your diabetes You can do many things to successfully manage your diabetes. Your health care providers will follow guidelines to help you get the best quality of care. Here are general guidelines for your diabetes management plan. Your health care providers may give you more specific instructions. Physical exams When you are diagnosed with diabetes, and each year after that, your health care provider will ask about your medical and family history. You will have a physical exam, which may include:  Measuring your height, weight, and body mass index (BMI).  Checking your blood pressure. This will be done at every routine medical visit. Your target blood pressure may vary depending on your medical conditions, your age, and other factors.  A thyroid exam.  A skin exam.  Screening for nerve damage (peripheral neuropathy). This may include checking the pulse in your legs and feet and the level of sensation in your hands and feet.  A foot exam to inspect the structure and skin of your feet, including checking for cuts, bruises,  redness, blisters, sores, or other problems.  Screening for blood vessel (vascular) problems. This may include checking the pulse in your legs and feet and checking your temperature. Blood tests Depending on your treatment plan and your personal needs, you may have the following tests:  Hemoglobin A1C (HbA1C). This test provides information about blood sugar (glucose) control over the previous 2-3 months. It is used to adjust your treatment plan, if needed. This test will be done: ? At least 2 times a year, if you are meeting your treatment goals. ? 4 times a year, if you are not meeting your treatment goals or if your goals have changed.  Lipid testing, including total cholesterol, LDL and HDL cholesterol, and triglyceride levels. ? The goal for LDL is less than 100 mg/dL (5.5 mmol/L). If you are at high risk for complications, the goal is less than 70 mg/dL (3.9 mmol/L). ? The goal for HDL is 40 mg/dL (2.2 mmol/L) or higher for men, and 50 mg/dL (2.8 mmol/L) or higher for women. An HDL cholesterol of 60 mg/dL (3.3 mmol/L) or higher gives some protection against heart disease. ? The goal for triglycerides is less than 150 mg/dL (8.3 mmol/L).  Liver function tests.  Kidney function tests.  Thyroid function tests.   Dental and eye exams  Visit your dentist two times a year.  If you have type 1 diabetes, your health care provider may recommend an eye exam within 5 years after you are diagnosed, and then once a year after your first exam. ? For children with type 1 diabetes, the health care provider  may recommend an eye exam when your child is age 65 or older and has had diabetes for 3-5 years. After the first exam, your child should get an eye exam once a year.  If you have type 2 diabetes, your health care provider may recommend an eye exam as soon as you are diagnosed, and then every 1-2 years after your first exam.   Immunizations  A yearly flu (influenza) vaccine is recommended  annually for everyone 6 months or older. This is especially important if you have diabetes.  The pneumonia (pneumococcal) vaccine is recommended for everyone 2 years or older who has diabetes. If you are age 56 or older, you may get the pneumonia vaccine as a series of two separate shots.  The hepatitis B vaccine is recommended for adults shortly after being diagnosed with diabetes. Adults and children with diabetes should receive all other vaccines according to age-specific recommendations from the Centers for Disease Control and Prevention (CDC). Mental and emotional health Screening for symptoms of eating disorders, anxiety, and depression is recommended at the time of diagnosis and after as needed. If your screening shows that you have symptoms, you may need more evaluation. You may work with a mental health care provider. Follow these instructions at home: Treatment plan You will monitor your blood glucose levels and may give yourself insulin. Your treatment plan will be reviewed at every medical visit. You and your health care provider will discuss:  How you are taking your medicines, including insulin.  Any side effects you have.  Your blood glucose level target goals.  How often you monitor your blood glucose level.  Lifestyle habits, such as activity level and tobacco, alcohol, and substance use. Education Your health care provider will assess how well you are monitoring your blood glucose levels and whether you are taking your insulin and medicines correctly. He or she may refer you to:  A certified diabetes care and education specialist to manage your diabetes throughout your life, starting at diagnosis.  A registered dietitian who can create and review your personal nutrition plan.  An exercise specialist who can discuss your activity level and exercise plan. General instructions  Take over-the-counter and prescription medicines only as told by your health care  provider.  Keep all follow-up visits. This is important. Where to find support There are many diabetes support networks, including:  American Diabetes Association (ADA): diabetes.org  Defeat Diabetes Foundation: defeatdiabetes.org Where to find more information  American Diabetes Association (ADA): www.diabetes.org  Association of Diabetes Care & Education Specialists (ADCES): diabeteseducator.org  International Diabetes Federation (IDF): https://www.munoz-bell.org/ Summary  Managing diabetes (diabetes mellitus) can be complicated. Your diabetes treatment may be managed by a team of health care providers.  Your health care providers follow guidelines to help you get the best quality care.  You should have physical exams, blood tests, blood pressure monitoring, immunizations, and screening tests regularly. Stay updated on how to manage your diabetes.  Your health care providers may also give you more specific instructions based on your individual health. This information is not intended to replace advice given to you by your health care provider. Make sure you discuss any questions you have with your health care provider. Document Revised: 02/19/2020 Document Reviewed: 02/19/2020 Elsevier Patient Education  [place].  If you have lab work done today you will be contacted with your lab results within the next 2 weeks.  If you have not heard from [place] then please contact us. The fastest way  to get your results is to register for My Chart.   IF you received an x-ray today, you will receive an invoice from [facility]. Please contact [facility] at [phone] with questions or concerns regarding your invoice.   IF you received labwork today, you will receive an invoice from [place]. Please contact [facility] at [phone] with questions or concerns regarding your invoice.   Our billing staff will not be able to assist you with questions regarding bills from these  companies.  You will be contacted with the lab results as soon as they are available. The fastest way to get your results is to activate your My Chart account. Instructions are located on the last page of this paperwork. If you have not heard from [place] regarding the results in 2 weeks, please contact this office.

## 2020-09-16 NOTE — Assessment & Plan Note (Signed)
Lab review 01/24/2018 M protein0.5 g IgM kappa light chain restricted,IgG 1723, IgA 540, IgM 706,Hemoglobin 15 g Bone survey 03/14/2018: Negative 09/05/2018: M protein 0.5 g IgM kappa light chain restricted,IgG 1416, kappa 40.1, KL ratio 2,Hemoglobin 13.6, creatinine 0.81 09/10/2019: M protein 0.4 g, IgM kappa light chain, kappa 34.8, lambda 17.1, ratio 2.04, hemoglobin 13.9, creatinine 0.99 09/09/2020: M protein 0.5 g, IgM 738 (IgM kappa) creatinine 1.02, calcium 9.6, hemoglobin 13.4, kappa 39.9: Ratio 1.74  I discussed the results of the blood work  There is been no significant change in the monoclonal protein. Return to clinic in 1 year with labs done ahead of time.

## 2020-09-17 LAB — MICROALBUMIN / CREATININE URINE RATIO
Creatinine, Urine: 39.7 mg/dL
Microalb/Creat Ratio: <8 mg/g creat (ref 0–29)
Microalbumin, Urine: <3 ug/mL

## 2020-09-20 ENCOUNTER — Other Ambulatory Visit: Payer: Self-pay | Admitting: Family Medicine

## 2020-09-20 ENCOUNTER — Encounter: Payer: Self-pay | Admitting: Family Medicine

## 2020-09-20 DIAGNOSIS — E1165 Type 2 diabetes mellitus with hyperglycemia: Secondary | ICD-10-CM

## 2020-09-20 DIAGNOSIS — E1159 Type 2 diabetes mellitus with other circulatory complications: Secondary | ICD-10-CM

## 2020-09-20 MED ORDER — INSULIN DETEMIR 100 UNIT/ML FLEXPEN: 10.0000 [IU] | PEN_INJECTOR | Freq: Every day | SUBCUTANEOUS | 11 refills | Status: DC

## 2020-09-20 MED FILL — UNIFINE PENTIPS 32GX5/32: 32G X 4 MM | 90 days supply | Qty: 100 | Fill #0

## 2020-09-20 NOTE — Progress Notes (Unsigned)
l °

## 2020-09-20 NOTE — Telephone Encounter (Signed)
I sent it  ?Thanks ?

## 2020-09-21 MED ORDER — INSULIN GLARGINE 100 UNIT/ML ~~LOC~~ SOLN: 10.0000 [IU] | Freq: Every day | SUBCUTANEOUS | 11 refills | Status: DC

## 2020-09-21 NOTE — Telephone Encounter (Signed)
[facility], [facility] for [facility], is calling making sure that the alternative for the Flexipen script sent over is a new insulin SEMGLEE and instead of a 75.00 copay, it is free. Wanting to make sure that the dr understood this as alternative?

## 2020-09-22 ENCOUNTER — Telehealth: Payer: Self-pay | Admitting: Family Medicine

## 2020-09-22 ENCOUNTER — Other Ambulatory Visit: Payer: Self-pay

## 2020-09-22 MED ORDER — INSULIN GLARGINE 100 UNIT/ML ~~LOC~~ SOLN: 10.0000 [IU] | Freq: Every day | SUBCUTANEOUS | 11 refills | Status: DC

## 2020-09-22 MED FILL — SEMGLEE (YFGN) 100 UNIT/ML: 100 | 90 days supply | Qty: 9 | Fill #0

## 2020-09-22 NOTE — Telephone Encounter (Signed)
Pt is calling and does not want to call [facility] to have rx switched. Pt would like insulin glargine semglee to be sent to [facility] [address] phone number [phone]

## 2020-09-27 MED FILL — FUROSEMIDE 40 MG TAB: 40 | 90 days supply | Qty: 180 | Fill #1

## 2020-10-04 ENCOUNTER — Other Ambulatory Visit: Payer: Managed Care, Other (non HMO)

## 2020-10-20 ENCOUNTER — Other Ambulatory Visit (HOSPITAL_COMMUNITY): Payer: Self-pay | Admitting: Family Medicine

## 2020-10-20 ENCOUNTER — Other Ambulatory Visit (HOSPITAL_COMMUNITY): Payer: Self-pay | Admitting: Cardiology

## 2020-10-20 DIAGNOSIS — E119 Type 2 diabetes mellitus without complications: Secondary | ICD-10-CM

## 2020-10-20 MED FILL — TRULICITY 3 MG/0.5ML SOPN: 3 | 28 days supply | Qty: 2 | Fill #3

## 2020-10-20 MED FILL — REPATHA SURECLICK 140 MG/ML: 140 | 28 days supply | Qty: 2 | Fill #5

## 2020-10-20 NOTE — Telephone Encounter (Signed)
   Notes to clinic These meds were both written by providers we do not approve Rx for, this is a pt of NP Just, Kelsea. Please assess.

## 2020-10-21 ENCOUNTER — Other Ambulatory Visit (HOSPITAL_COMMUNITY): Payer: Self-pay | Admitting: Cardiology

## 2020-10-21 MED FILL — SPIRONOLACTONE 25 MG TABS: 25 | 90 days supply | Qty: 90 | Fill #0

## 2020-10-21 MED FILL — ENTRESTO 49 MG-51 MG TABLET: 49-51 | 30 days supply | Qty: 60 | Fill #0

## 2020-10-21 MED FILL — METFORMIN HCL 1000 MG TABS: 1000 | 90 days supply | Qty: 180 | Fill #0

## 2020-10-21 MED FILL — ASPIRIN CHILD 81 MG TAB CHE: 81 | 90 days supply | Qty: 90 | Fill #0

## 2020-10-26 ENCOUNTER — Encounter (HOSPITAL_COMMUNITY): Payer: Self-pay | Admitting: Cardiology

## 2020-10-26 ENCOUNTER — Encounter: Payer: Self-pay | Admitting: Family Medicine

## 2020-10-26 ENCOUNTER — Ambulatory Visit (HOSPITAL_COMMUNITY)
Admission: RE | Admit: 2020-10-26 | Discharge: 2020-10-26 | Disposition: A | Discharge status: Home / Self Care | Payer: Managed Care, Other (non HMO) | Source: Ambulatory Visit | Attending: Cardiology | Admitting: Cardiology

## 2020-10-26 ENCOUNTER — Other Ambulatory Visit (HOSPITAL_COMMUNITY): Payer: Self-pay | Admitting: Family Medicine

## 2020-10-26 ENCOUNTER — Telehealth (INDEPENDENT_AMBULATORY_CARE_PROVIDER_SITE_OTHER): Payer: Managed Care, Other (non HMO) | Admitting: Family Medicine

## 2020-10-26 ENCOUNTER — Other Ambulatory Visit: Payer: Self-pay

## 2020-10-26 VITALS — BP 104/78 | HR 95 | Wt 292.0 lb

## 2020-10-26 DIAGNOSIS — Z7982 Long term (current) use of aspirin: Secondary | ICD-10-CM | POA: Diagnosis not present

## 2020-10-26 DIAGNOSIS — I252 Old myocardial infarction: Secondary | ICD-10-CM | POA: Diagnosis not present

## 2020-10-26 DIAGNOSIS — I2510 Atherosclerotic heart disease of native coronary artery without angina pectoris: Secondary | ICD-10-CM | POA: Diagnosis not present

## 2020-10-26 DIAGNOSIS — Z87891 Personal history of nicotine dependence: Secondary | ICD-10-CM | POA: Insufficient documentation

## 2020-10-26 DIAGNOSIS — I5022 Chronic systolic (congestive) heart failure: Secondary | ICD-10-CM

## 2020-10-26 DIAGNOSIS — E1159 Type 2 diabetes mellitus with other circulatory complications: Secondary | ICD-10-CM | POA: Diagnosis not present

## 2020-10-26 DIAGNOSIS — E1165 Type 2 diabetes mellitus with hyperglycemia: Secondary | ICD-10-CM | POA: Diagnosis not present

## 2020-10-26 DIAGNOSIS — Z794 Long term (current) use of insulin: Secondary | ICD-10-CM | POA: Diagnosis not present

## 2020-10-26 DIAGNOSIS — Z79899 Other long term (current) drug therapy: Secondary | ICD-10-CM | POA: Diagnosis not present

## 2020-10-26 LAB — BASIC METABOLIC PANEL
Anion gap: 11 (ref 5–15)
BUN: 15 mg/dL (ref 8–23)
CO2: 27 mmol/L (ref 22–32)
Calcium: 9 mg/dL (ref 8.9–10.3)
Chloride: 98 mmol/L (ref 98–111)
Creatinine, Ser: 0.89 mg/dL (ref 0.44–1.00)
GFR, Estimated: >60 mL/min (ref >60)
Glucose, Bld: 301 mg/dL — ABNORMAL HIGH (ref 70–99)
Potassium: 4.5 mmol/L (ref 3.5–5.1)
Sodium: 136 mmol/L (ref 135–145)

## 2020-10-26 MED ORDER — INSULIN GLARGINE 100 UNIT/ML ~~LOC~~ SOLN: 13.0000 [IU] | Freq: Every day | SUBCUTANEOUS | 11 refills | Status: DC

## 2020-10-26 NOTE — Patient Instructions (Addendum)
Diabetes Mellitus and [place] of [place] with and managing diabetes (diabetes mellitus) can be complicated. Your diabetes treatment may be managed by a team of health care providers, including:  A physician who specializes in diabetes (endocrinologist). You might also have visits with a nurse practitioner or physician assistant.  Nurses.  A registered dietitian.  A certified diabetes care and education specialist.  An exercise specialist.  A pharmacist.  An eye doctor.  A foot specialist (podiatrist).  A dental care provider.  A primary care provider.  A mental health care provider. How to manage your diabetes You can do many things to successfully manage your diabetes. Your health care providers will follow guidelines to help you get the best quality of care. Here are general guidelines for your diabetes management plan. Your health care providers may give you more specific instructions. Physical exams When you are diagnosed with diabetes, and each year after that, your health care provider will ask about your medical and family history. You will have a physical exam, which may include:  Measuring your height, weight, and body mass index (BMI).  Checking your blood pressure. This will be done at every routine medical visit. Your target blood pressure may vary depending on your medical conditions, your age, and other factors.  A thyroid exam.  A skin exam.  Screening for nerve damage (peripheral neuropathy). This may include checking the pulse in your legs and feet and the level of sensation in your hands and feet.  A foot exam to inspect the structure and skin of your feet, including checking for cuts, bruises, redness, blisters, sores, or other problems.  Screening for blood vessel (vascular) problems. This may include checking the pulse in your legs and feet and checking your temperature. Blood tests Depending on your treatment plan and your  personal needs, you may have the following tests:  Hemoglobin A1C (HbA1C). This test provides information about blood sugar (glucose) control over the previous 2-3 months. It is used to adjust your treatment plan, if needed. This test will be done: ? At least 2 times a year, if you are meeting your treatment goals. ? 4 times a year, if you are not meeting your treatment goals or if your goals have changed.  Lipid testing, including total cholesterol, LDL and HDL cholesterol, and triglyceride levels. ? The goal for LDL is less than 100 mg/dL (5.5 mmol/L). If you are at high risk for complications, the goal is less than 70 mg/dL (3.9 mmol/L). ? The goal for HDL is 40 mg/dL (2.2 mmol/L) or higher for men, and 50 mg/dL (2.8 mmol/L) or higher for women. An HDL cholesterol of 60 mg/dL (3.3 mmol/L) or higher gives some protection against heart disease. ? The goal for triglycerides is less than 150 mg/dL (8.3 mmol/L).  Liver function tests.  Kidney function tests.  Thyroid function tests.   Dental and eye exams  Visit your dentist two times a year.  If you have type 1 diabetes, your health care provider may recommend an eye exam within 5 years after you are diagnosed, and then once a year after your first exam. ? For children with type 1 diabetes, the health care provider may recommend an eye exam when your child is age 41 or older and has had diabetes for 3-5 years. After the first exam, your child should get an eye exam once a year.  If you have type 2 diabetes, your health care provider may recommend an  eye exam as soon as you are diagnosed, and then every 1-2 years after your first exam.   Immunizations  A yearly flu (influenza) vaccine is recommended annually for everyone 6 months or older. This is especially important if you have diabetes.  The pneumonia (pneumococcal) vaccine is recommended for everyone 2 years or older who has diabetes. If you are age 64 or older, you may get the  pneumonia vaccine as a series of two separate shots.  The hepatitis B vaccine is recommended for adults shortly after being diagnosed with diabetes. Adults and children with diabetes should receive all other vaccines according to age-specific recommendations from the Centers for Disease Control and Prevention (CDC). Mental and emotional health Screening for symptoms of eating disorders, anxiety, and depression is recommended at the time of diagnosis and after as needed. If your screening shows that you have symptoms, you may need more evaluation. You may work with a mental health care provider. Follow these instructions at home: Treatment plan You will monitor your blood glucose levels and may give yourself insulin. Your treatment plan will be reviewed at every medical visit. You and your health care provider will discuss:  How you are taking your medicines, including insulin.  Any side effects you have.  Your blood glucose level target goals.  How often you monitor your blood glucose level.  Lifestyle habits, such as activity level and tobacco, alcohol, and substance use. Education Your health care provider will assess how well you are monitoring your blood glucose levels and whether you are taking your insulin and medicines correctly. He or she may refer you to:  A certified diabetes care and education specialist to manage your diabetes throughout your life, starting at diagnosis.  A registered dietitian who can create and review your personal nutrition plan.  An exercise specialist who can discuss your activity level and exercise plan. General instructions  Take over-the-counter and prescription medicines only as told by your health care provider.  Keep all follow-up visits. This is important. Where to find support There are many diabetes support networks, including:  American Diabetes Association (ADA): diabetes.org  Defeat Diabetes Foundation: defeatdiabetes.org Where to  find more information  American Diabetes Association (ADA): www.diabetes.org  Association of Diabetes Care & Education Specialists (ADCES): diabeteseducator.org  International Diabetes Federation (IDF): https://www.munoz-bell.org/ Summary  Managing diabetes (diabetes mellitus) can be complicated. Your diabetes treatment may be managed by a team of health care providers.  Your health care providers follow guidelines to help you get the best quality care.  You should have physical exams, blood tests, blood pressure monitoring, immunizations, and screening tests regularly. Stay updated on how to manage your diabetes.  Your health care providers may also give you more specific instructions based on your individual health. This information is not intended to replace advice given to you by your health care provider. Make sure you discuss any questions you have with your health care provider. Document Revised: 02/19/2020 Document Reviewed: 02/19/2020 Elsevier Patient Education  [place]. If you have lab work done today you will be contacted with your lab results within the next 2 weeks.  If you have not heard from [place] then please contact us. The fastest way to get your results is to register for My Chart.   IF you received an x-ray today, you will receive an invoice from [facility]. Please contact [facility] at [phone] with questions or concerns regarding your invoice.   IF you received labwork today, you will receive  an Pharmacologist from [employer]. Please contact [place] at [phone] with questions or concerns regarding your invoice.   Our billing staff will not be able to assist you with questions regarding bills from these companies.  You will be contacted with the lab results as soon as they are available. The fastest way to get your results is to activate your My Chart account. Instructions are located on the last page of this paperwork. If you have not heard from [place] regarding  the results in 2 weeks, please contact this office.

## 2020-10-26 NOTE — Progress Notes (Signed)
Virtual Visit Note  I connected with patient on 10/26/20 at 1634 by telephone due to unable to work Epic video visit and verified that I am speaking with the correct person using two identifiers. Terri Heath is currently located at home and no family members are currently with them during visit. The provider, Laurita Quint Kiera Hussey, FNP is located in their office at time of visit.  I discussed the limitations, risks, security and privacy concerns of performing an evaluation and management service by telephone and the availability of in person appointments. I also discussed with the patient that there may be a patient responsible charge related to this service. The patient expressed understanding and agreed to proceed.   I provided 20 minutes of non-face-to-face time during this encounter.  Chief Complaint  Patient presents with  . Diabetes    5 week follow up    Patient is a 65 y.o. female with past medical history significant for DM, PVD, CAD, sCHF, DRwho presents today for diabetesfollow up.  HPI   BP Readings from Last 3 Encounters:  10/26/20 104/78  09/16/20 122/80  07/14/20 113/77   ? DM Trulicity: 3mg  Metformin 2000mg  Did not tolerate: Invokana, Jardiance, glipizide, actos and ozempic   Started Long acting insulin last OV Start with 10 units daily of Semglee Daily take your Blood sugar in the AM Increase the Semglee every 3 days by 2 units for a goal of AM sugars (120-140) Once at that goal levemir let me know what dose you are on  Fasting Blood sugar was 198 On the long acting 10 units Giving insulin at night Tonight do 13 units Has lost 10 pounds Wt Readings from Last 3 Encounters:  10/26/20 292 lb (132.5 kg)  09/16/20 292 lb (132.5 kg)  07/14/20 296 lb (134.3 kg)   Lab Results  Component Value Date   HGBA1C 14.0 (A) 09/16/2020   HGBA1C 11.8 (A) 06/14/2020   HGBA1C 14.4 (H) 09/22/2019   Lab Results  Component Value Date   MICROALBUR 2.2 03/14/2016    LDLCALC 88 07/14/2020   CREATININE 0.89 10/26/2020     Allergies  Allergen Reactions  . Amoxicillin Other (See Comments)    Dizziness, abdominal pain  . Adhesive [Tape] Rash  . Exenatide Other (See Comments)    Flu-like symptoms  . Invokana [Canagliflozin] Other (See Comments)    Yeast infection/dehydration  . Jardiance [Empagliflozin] Other (See Comments)    Yeast infections and dehydration  . Lisinopril Other (See Comments)    cramping  . Other Diarrhea and Other (See Comments)    Kale=food  Per patient, it causes GI bleeding   . Statins Other (See Comments)    Leg cramps    Prior to Admission medications   Medication Sig Start Date End Date Taking? Authorizing Provider  albuterol (PROAIR HFA) 108 (90 Base) MCG/ACT inhaler Inhale 2 puffs into the lungs every 6 (six) hours as needed. wheezing 02/21/19  Yes Rigoberto Noel, MD  aspirin 81 MG chewable tablet TAKE 1 TABLET BY MOUTH DAILY. 10/21/20  Yes Larey Dresser, MD  bismuth subsalicylate (PEPTO BISMOL) 262 MG chewable tablet Chew 524 mg by mouth as needed for indigestion or diarrhea or loose stools.    Yes [provider]  carvedilol (COREG) 12.5 MG tablet TAKE 1 TABLET BY MOUTH 2 TIMES A DAY. 04/29/20  Yes Larey Dresser, MD  cetirizine (ZYRTEC) 10 MG tablet Take 10 mg by mouth as needed.    Yes [provider]  diphenoxylate-atropine (LOMOTIL) 2.5-0.025 MG tablet Take 1 tablet by mouth 4 (four) times daily as needed for diarrhea or loose stools. 01/15/20  Yes Lamptey, Myrene Galas, MD  ENTRESTO 49-51 MG TAKE 1 TABLET BY MOUTH 2 (TWO) TIMES DAILY. 10/21/20  Yes Larey Dresser, MD  Evolocumab (REPATHA SURECLICK) 244 MG/ML SOAJ Inject 1 pen into the skin every 14 (fourteen) days. 05/05/20  Yes Larey Dresser, MD  furosemide (LASIX) 40 MG tablet TAKE 1 TABLET (40 MG TOTAL) BY MOUTH 2 TIMES DAILY. 06/22/20  Yes Jettie Booze, MD  glucose blood test strip  10/02/19  Yes [provider]  insulin  glargine (SEMGLEE) 100 UNIT/ML injection Inject 0.1 mLs (10 Units total) into the skin daily. 09/22/20  Yes Cosby Proby, Laurita Quint, FNP  Insulin Pen Needle 32G X 4 MM MISC Use 1x a day 09/16/20  Yes Aiden Rao, Laurita Quint, FNP  levothyroxine (SYNTHROID) 50 MCG tablet TAKE 1 TABLET BY MOUTH DAILY BEFORE BREAKFAST. 07/14/20  Yes Dequincy Born, Laurita Quint, FNP  magnesium oxide (MAG-OX) 400 MG tablet Take 400 mg by mouth at bedtime as needed (for cramps).   Yes [provider]  metFORMIN (GLUCOPHAGE) 1000 MG tablet TAKE 1 TABLET (1,000 MG TOTAL) BY MOUTH TWICE A DAY WITH A MEAL. 10/21/20  Yes Zalmen Wrightsman, Laurita Quint, FNP  Multiple Vitamin (MULITIVITAMIN WITH MINERALS) TABS Take 1 tablet by mouth daily with breakfast.   Yes [provider]  spironolactone (ALDACTONE) 25 MG tablet TAKE 1 TABLET (25 MG TOTAL) BY MOUTH DAILY. 10/21/20 01/19/21 Yes Halayna Blane, Laurita Quint, FNP  diphenhydrAMINE HCl (BENADRYL ALLERGY PO) Take 1 tablet by mouth as needed.    [provider]    Past Medical History:  Diagnosis Date  . Allergy   . Asthma   . Bilateral shoulder pain   . Borderline hypertension   . Bronchitis   . CAD (coronary artery disease)   . Chronic systolic CHF (congestive heart failure) ([place])   . Diabetes mellitus    Diagnosed in 2000  . History of MI (myocardial infarction)   . Hyperlipidemia   . Hypertension   . Hypothyroidism   . MGUS (monoclonal gammopathy of unknown significance)   . Mitral regurgitation   . Obesity   . Sleep apnea   . Statin intolerance     Past Surgical History:  Procedure Laterality Date  . BREATH TEK H PYLORI  09/18/2011   Procedure: BREATH TEK H PYLORI;  Surgeon: Shann Medal, MD;  Location: [facility];  Service: General;  Laterality: N/A;  . CESAREAN SECTION     x 3  . LAPAROSCOPIC APPENDECTOMY N/A 10/03/2017   Procedure: APPENDECTOMY LAPAROSCOPIC;  Surgeon: Leighton Ruff, MD;  Location: [facility];  Service: General;  Laterality: N/A;  . RIGHT/LEFT HEART CATH AND CORONARY  ANGIOGRAPHY N/A 02/12/2018   Procedure: RIGHT/LEFT HEART CATH AND CORONARY ANGIOGRAPHY;  Surgeon: Jettie Booze, MD;  Location: [facility];  Service: Cardiovascular;  Laterality: N/A;  . TUBAL LIGATION      Social History   Tobacco Use  . Smoking status: Former Smoker    Packs/day: 1.00    Years: 15.00    Pack years: 15.00    Types: Cigarettes    Quit date: 01/28/1990    Years since quitting: 30.7  . Smokeless tobacco: Never Used  Substance Use Topics  . Alcohol use: No    Family History  Problem Relation Age of Onset  . Alcohol abuse Mother   .  Arthritis Mother   . Diabetes Mother   . Sleep apnea Mother   . Anxiety disorder Mother   . Eating disorder Mother   . Obesity Mother   . Alcohol abuse Father   . Heart disease Father   . Hypertension Father   . Stroke Father   . Cancer Father   . Sleep apnea Sister   . Breast cancer Neg Hx     Review of Systems  Constitutional: Negative for malaise/fatigue.  Respiratory: Negative for cough and sputum production.   Cardiovascular: Negative for chest pain and palpitations.  Gastrointestinal: Negative for abdominal pain, heartburn, nausea and vomiting.  Musculoskeletal: Negative for myalgias.  Neurological: Negative for dizziness and headaches.    Objective  Constitutional:      General: Not in acute distress.    Appearance: Normal appearance. Not ill-appearing.   Pulmonary:     Effort: Pulmonary effort is normal. No respiratory distress.  Neurological:     Mental Status: Alert and oriented to person, place, and time.  Psychiatric:        Mood and Affect: Mood normal.        Behavior: Behavior normal.     ASSESSMENT and PLAN  Problem List Items Addressed This Visit      Endocrine   Poorly controlled type 2 diabetes mellitus with circulatory disorder ([place]) - Primary   Relevant Medications   insulin glargine (SEMGLEE) 100 UNIT/ML injection     Plan . Increased long acting insulin to 13 units per  night . Agreed to send weekly fasting blood sugars for insulin adjustment . Continue to work on [employer]  Return in about 2 months (around 12/26/2020).  The above assessment and management plan was discussed with the patient. The patient verbalized understanding of and has agreed to the management plan. Patient is aware to call the clinic if symptoms persist or worsen. Patient is aware when to return to the clinic for a follow-up visit. Patient educated on when it is appropriate to go to the emergency department.     Huston Foley Elmer Merwin, FNP-BC Primary Care at [facility] [place], [place] [address] Ph.  [phone] Fax [phone]

## 2020-10-26 NOTE — Patient Instructions (Addendum)
EKG done today.  Labs done today. We will contact you only if your labs are abnormal.  No medication changes were made. Please continue all current medications as prescribed.  Your physician recommends that you schedule a follow-up appointment in: 4 months with an echo prior to your exam. Please contact our office in June to schedule a July appointment.  Your physician has requested that you have an echocardiogram. Echocardiography is a painless test that uses sound waves to create images of your heart. It provides your doctor with information about the size and shape of your heart and how well your heart's chambers and valves are working. This procedure takes approximately one hour. There are no restrictions for this procedure.   If you have any questions or concerns before your next appointment please send [place] a message through [place] or call our office at [phone].    TO LEAVE A MESSAGE FOR THE NURSE SELECT OPTION 2, PLEASE LEAVE A MESSAGE INCLUDING: . YOUR NAME . DATE OF BIRTH . CALL BACK NUMBER . REASON FOR CALL**this is important as we prioritize the call backs  YOU WILL RECEIVE A CALL BACK THE SAME DAY AS LONG AS YOU CALL BEFORE 4:00 PM   Do the following things EVERYDAY: 1) Weigh yourself in the morning before breakfast. Write it down and keep it in a log. 2) Take your medicines as prescribed 3) Eat low salt foods--Limit salt (sodium) to 2000 mg per day.  4) Stay as active as you can everyday 5) Limit all fluids for the day to less than 2 liters   At the [facility], you and your health needs are our priority. As part of our continuing mission to provide you with exceptional heart care, we have created designated Provider Care Teams. These Care Teams include your primary Cardiologist (physician) and Advanced Practice Providers (APPs- Physician Assistants and Nurse Practitioners) who all work together to provide you with the care you need, when you need it.    You may see any of the following providers on your designated Care Team at your next follow up: Marland Kitchen Dr Glori Bickers . Dr Loralie Champagne . Darrick Grinder, NP . Lyda Jester, PA . Audry Riles, PharmD   Please be sure to bring in all your medications bottles to every appointment.

## 2020-10-27 NOTE — Progress Notes (Signed)
PCP: Just, Laurita Quint, FNP  Cardiology: Dr. Irish Lack HF Cardiology: Dr. Aundra Dubin  65 y.o. with history of chronic systolic CHF, CAD, and MGUS was referred by Dr. Irish Lack for evaluation of CHF.  Patient first developed exertional dyspnea after her appendectomy in 2/19.  By 5/19, she was gaining weight and had developed a chronic cough. She never had chest pain.  Echo was done in 5/19 showing EF 25-30%, moderate MR, moderate RV dilation.  RHC/LHC was done in 6/19 showing elevated filling pressures, preserved cardiac output, and occluded mid LCx and distal RCA.  She was found to have IgM monoclonal antibody.  Skeletal survey showed no lytic lesions, likely MGUS.  Cardiac MRI in 8/19 showed EF 32%, subendocardial scar (consistent with prior MI) at inferior base, moderately dilated RV with RV EF 20%. No evidence for cardiac amyloidosis by MRI.  Repeat echo in 10/19 showed EF remaining 30-35%.  She was seen by Dr. Rayann Heman and declined ICD.   Echo in 10/20 showed EF 40-45%.   She was unable to tolerate Entresto 97/103 due to lightheadedness.   She returns for followup of CHF.  Weight is down 10 lbs.  No significant exertional dyspnea though she is not particularly active.  Fatigue if she walks a long distance. No chest pain.  Occasional mild lightheadedness if she stands up too fast.  No palpitations.    ECG (personally reviewed): NSR, low voltage     Labs (5/19): transferrin saturation 20%, immunofixation with IgM monoclonal antibody.  Labs (6/19): K 4.5, creatinine 0.79 Labs (7/19): K 4.1, creatinine 0.86, TSH normal Labs (9/19): K 4.4, creatinine 0.61 Labs (10/19): LDL 187 Labs (12/19): LDL 82 Labs (1/20): K 4, creatinine 0.81 Labs (8/20): K 4.4, creatinine 0.82, LDL 92 Labs (11/21): LDL 88, HDl 52, TGs 208 Labs (1/22): K 4.5, creatinine 1.02  PMH: 1. Type II diabetes 2. OSA 3. H/o appendectomy 4. 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  Skeletal survey in 7/19  showed no lytic lesions.  6. 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   - Echo (5/19): EF 25-30%, mild LV dilation, moderate MR, moderately dilated RV.  - LHC/RHC (6/19): distal RCA totally occluded, mid LCx totally occluded, EF < 25%. No intervention. Mean RA 10, mean PA 42, mean PCWP 29, CI 2.3.  - Cardiac MRI in 8/19 showed EF 32%, subendocardial scar (consistent with prior MI) at inferior base, moderately dilated RV with RV EF 20%. No evidence for cardiac amyloidosis by MRI.  - Echo (10/19): EF 30-35%.   - Echo (10/20): EF 40-45%, mild LVH, mild MR 7. CAD: LHC (6/19) with distal RCA totally occluded, mid LCx totally occluded, EF < 25%. No intervention. 8. Hyperlipidemia: Has not tolerated statins.  9. ABIs (8/19): Normal.   SH: Married, works as Nurse, adult, quit smoking in 1991.    FH: Mother with rheumatic MV disease, s/p MVR.   ROS: All systems reviewed and negative except as per HPI.   Current Outpatient Medications  Medication Sig Dispense Refill  . albuterol (PROAIR HFA) 108 (90 Base) MCG/ACT inhaler Inhale 2 puffs into the lungs every 6 (six) hours as needed. wheezing 18 g 2  . aspirin 81 MG chewable tablet TAKE 1 TABLET BY MOUTH DAILY. 90 tablet 3  . bismuth subsalicylate (PEPTO BISMOL) 262 MG chewable tablet Chew 524 mg by mouth as needed for indigestion or diarrhea or loose stools.     . carvedilol (COREG) 12.5 MG tablet TAKE 1  TABLET BY MOUTH 2 TIMES A DAY. 180 tablet 3  . cetirizine (ZYRTEC) 10 MG tablet Take 10 mg by mouth as needed.     . diphenhydrAMINE HCl (BENADRYL ALLERGY PO) Take 1 tablet by mouth as needed.    . diphenoxylate-atropine (LOMOTIL) 2.5-0.025 MG tablet Take 1 tablet by mouth 4 (four) times daily as needed for diarrhea or loose stools. 30 tablet 0  . ENTRESTO 49-51 MG TAKE 1 TABLET BY MOUTH 2 (TWO) TIMES DAILY. 60 tablet 0  . Evolocumab (REPATHA SURECLICK) 008 MG/ML SOAJ Inject 1 pen into the skin every 14 (fourteen)  days. 2 mL 11  . furosemide (LASIX) 40 MG tablet TAKE 1 TABLET (40 MG TOTAL) BY MOUTH 2 TIMES DAILY. 180 tablet 3  . glucose blood test strip     . Insulin Pen Needle 32G X 4 MM MISC Use 1x a day 100 each 3  . levothyroxine (SYNTHROID) 50 MCG tablet TAKE 1 TABLET BY MOUTH DAILY BEFORE BREAKFAST. 90 tablet 3  . magnesium oxide (MAG-OX) 400 MG tablet Take 400 mg by mouth at bedtime as needed (for cramps).    . metFORMIN (GLUCOPHAGE) 1000 MG tablet TAKE 1 TABLET (1,000 MG TOTAL) BY MOUTH TWICE A DAY WITH A MEAL. 180 tablet 1  . Multiple Vitamin (MULITIVITAMIN WITH MINERALS) TABS Take 1 tablet by mouth daily with breakfast.    . spironolactone (ALDACTONE) 25 MG tablet TAKE 1 TABLET (25 MG TOTAL) BY MOUTH DAILY. 90 tablet 3  . insulin glargine (SEMGLEE) 100 UNIT/ML injection Inject 0.13 mLs (13 Units total) into the skin at bedtime. 15 mL 11   No current facility-administered medications for this encounter.   BP 104/78   Pulse 95   Wt 132.5 kg (292 lb)   SpO2 97%   BMI 45.73 kg/m  General: NAD Neck: No JVD, no thyromegaly or thyroid nodule.  Lungs: Clear to auscultation bilaterally with normal respiratory effort. CV: Nondisplaced PMI.  Heart regular S1/S2, no S3/S4, no murmur.  No peripheral edema.  No carotid bruit.  Normal pedal pulses.  Abdomen: Soft, nontender, no hepatosplenomegaly, no distention.  Skin: Intact without lesions or rashes.  Neurologic: Alert and oriented x 3.  Psych: Normal affect. Extremities: No clubbing or cyanosis.  HEENT: Normal.   Assessment/Plan: 1. Chronic systolic CHF: Echo in 6/76 with EF 25-30%.  She had occluded mid LCx and distal RCA on coronary angiography.  Suspect ischemic cardiomyopathy.  She also has a monoclonal antibody.  Cardiac MRI in 8/19 showed LV EF 32% and RV EF 30%, subendocardial scar at inferior base consistent with prior MI, no evidence for cardiac amyloidosis.  Repeat echo in 10/19 showed EF 30-35%.  Patient saw Dr. Rayann Heman and declined ICD.   However, echo in 10/20 showed EF up to 40-45% and out of ICD range.  NYHA class II symptoms, not volume overloaded on exam.   - Continue Entresto 49/51 bid, unable to tolerate increase to 97/103 bid.  - Continue spironolactone 25 mg daily.   - Continue Coreg 12.5 mg bid.   - She has not wanted to take an SGLT2 inhibitor due to history of GU infections.    - Continue Lasix 40 mg bid, BMET today.  - Repeat echo at followup in 4 months.  2. CAD: Occluded mLCx and dRCA.  Low EF somewhat out of proportion to this.  No interventional target.   - Continue ASA 81 daily.  - Unable to tolerate statins, taking Repatha.     Followup  in 4 months with echo.    Terri Heath 10/27/2020

## 2020-10-29 ENCOUNTER — Telehealth: Payer: Self-pay | Admitting: Family Medicine

## 2020-10-29 NOTE — Telephone Encounter (Signed)
What is the name of the medication? Terri Heath (SEMGLEE) 100 UNIT/ML injection [[id number]]    Have you contacted your pharmacy to request a refill? The pharmacist is calling to see if we could switch the VIALS to the PENS. She says this is much easier for the pt.   Which pharmacy would you like this sent to? Pharmacy  [place], [place] - [address]  [address] [place] [address]  Phone:  [phone] Fax:  [phone]       Patient notified that their request is being sent to the clinical staff for review and that they should receive a call once it is complete. If they do not receive a call within 72 hours they can check with their pharmacy or our office.

## 2020-11-01 ENCOUNTER — Other Ambulatory Visit: Payer: Self-pay

## 2020-11-01 NOTE — Telephone Encounter (Signed)
Spoke with pharmacist at [facility] and switched it to prefilled pens VO.

## 2020-11-04 ENCOUNTER — Encounter: Payer: Self-pay | Admitting: Family Medicine

## 2020-11-08 ENCOUNTER — Other Ambulatory Visit (HOSPITAL_COMMUNITY): Payer: Self-pay | Admitting: Cardiology

## 2020-11-24 MED FILL — SEMGLEE (YFGN) 100 UNIT/ML: 100 | 90 days supply | Qty: 12 | Fill #0

## 2020-11-24 MED FILL — REPATHA SURECLICK 140 MG/ML: 140 | 28 days supply | Qty: 2 | Fill #6

## 2020-11-24 MED FILL — TRULICITY 3 MG/0.5ML SOPN: 3 | 84 days supply | Qty: 6 | Fill #4

## 2020-11-27 ENCOUNTER — Other Ambulatory Visit (HOSPITAL_COMMUNITY): Payer: Self-pay

## 2020-12-21 ENCOUNTER — Other Ambulatory Visit (HOSPITAL_COMMUNITY): Payer: Self-pay | Admitting: Internal Medicine

## 2020-12-21 ENCOUNTER — Other Ambulatory Visit (HOSPITAL_COMMUNITY): Payer: Self-pay

## 2020-12-21 MED FILL — Evolocumab Subcutaneous Soln Auto-Injector 140 MG/ML: SUBCUTANEOUS | 28 days supply | Qty: 2 | Fill #0 | Status: AC

## 2020-12-21 MED FILL — Furosemide Tab 40 MG: ORAL | 90 days supply | Qty: 180 | Fill #0 | Status: AC

## 2020-12-21 MED FILL — Sacubitril-Valsartan Tab 49-51 MG: ORAL | 30 days supply | Qty: 60 | Fill #0 | Status: AC

## 2020-12-22 ENCOUNTER — Other Ambulatory Visit (HOSPITAL_COMMUNITY): Payer: Self-pay

## 2020-12-22 MED ORDER — TRULICITY 3 MG/0.5ML ~~LOC~~ SOAJ: 3.0000 mg | Freq: Every day | SUBCUTANEOUS | 0 refills | Status: DC

## 2021-01-05 NOTE — Progress Notes (Signed)
Terri Keeler, DNP, AGNP-c Primary Care Services ______________________________________________________________________________________________________________________________________________  HPI Terri Heath is a 65 y.o. year old female presenting to [facility] [place] today to establish care.  Previous PCP: Terri Schlichter, NP with [facility] was: 04/11/2019 Other providers seen: Dr. Aundra Heath with Cardiology, Dr. Elsworth Heath with Pulmonology, Dr. Lucia Heath with General Surgery, Dr. Lindi Heath with Oncology  Concerns today: DIABETES Terri Heath has had diabetes for quit some time now.  Reports it has been uncontrolled for several years and she has struggled with using insulin due to weight gain.  Has been on meal time coverage, but admits that she was not careful with her diet while on it because she could easily cover the carbs. Gained about 80 lbs.  Stopped meal coverage in 2016 and went on Keto diet and lost the 80 lbs.  Reports she was seeing Dr. Cruzita Heath with endocrinology, but was not adherent to medication and diet recommendations and Dr. Cruzita Heath "fired" her.  Most recently DM managed by PCP.  Currently taking insulin glargine 20 units- taking in the morning. AM blood sugars in mid-upper 200's. Endorses she is not titrating like she should. Has not been consistent with BG monitoring. Reports that when she completely avoids carbohydrates, AM blood sugars are still significantly elevated.  Eats dinner at 6:30 and no snacking after meals. Goes to bed at 9:30. Wakes up "due to high blood sugars".  She does not wish to be on meal time coverage and endorses hesitancy to increase glargine dosage due to fear of weight gain.  Also endorses her left leg swells more when taking insulin.   OSA Hx of sleep apnea. Followed by pulmonology. Has CPAP. Wears some nights, but not consistently every night or all night.   RASH She endorses a red, itchy rash on her  back that she noticed while sitting on her porch last week.  Has been using neosporin on the area. Improving, but still itching.  No pain or burning. Has not had shingles vaccines. Possible it is an insect bite.  EAR PAIN Endorses pain on the auricle of the right ear.  Dermatology has biopsied and "burned" the area previously. No cancerous cells on biopsy. Lesion consistently present. Started feeling painful earlier this week.  Reports that the area consistently appears "scaly".   She also endorses a history of MI, CHFHTN, HLD, asthma (well controlled), and hypothyroidism.   Narrative: Terri Heath is Married and currently lives with her husband. Reports is safe in their current relationships and home environment.  Currently currently employed and works from home. Diet consists of protein and vegetables for dinner- egg and toast at breakfast. Tries to avoid sugar and carbohydrates, but it is difficult.  Physical activity of non reported former nicotine use, denies recreational drug use, and reports alcohol use on rare occasion.  reports is not currently sexually active with no sexual partners.  denies history of STI and denies concerns for STI today. is not planning pregnancy at this time. Contraception choices are post-menopausal.  PHQ9 Today: Depression screen [facility] 2/9 01/06/2021 10/26/2020 10/26/2020  Decreased Interest 0 0 0  Down, Depressed, Hopeless 0 0 0  PHQ - 2 Score 0 0 0  Altered sleeping 0 - -  Tired, decreased energy 1 - -  Change in appetite 0 - -  Feeling bad or failure about yourself  0 - -  Trouble concentrating 0 - -  Moving slowly or fidgety/restless 0 - -  Suicidal  thoughts 0 - -  PHQ-9 Score 1 - -  Difficult doing work/chores Somewhat difficult - -  Some recent data might be hidden   GAD7 Today: GAD 7 : Generalized Anxiety Score 01/06/2021  Nervous, Anxious, on Edge 0  Control/stop worrying 0  Worry too much - different things 1  Trouble relaxing 0   Restless 0  Easily annoyed or irritable 0  Afraid - awful might happen 0  Total GAD 7 Score 1  Anxiety Difficulty Somewhat difficult    Health Maintenance: Labs: 09/16/2020 Mammogram: 07/18/2020 Colonoscopy: N/A Cologuard: 05/01/2019 DEXA: 02/26/2004 Pap:04/11/2019 HPV: 04/11/2019 HIV Screen: DUE HepC Screen: 0/14/2020 Ophthalmology: 02/16/2020 A1c: 09/16/2020 @ 14.0% - DUE Foot Exam: 07/14/2020 PPSV23: 08/25/2005 Shingrix:DUE Flu: 06/23/2020 COVID: 08/23/2019, 09/10/2019  HPV: Aged Out Tdap: 0/14/2020  PMH Past Medical History:  Diagnosis Date  . Acute systolic heart failure ([place])   . Allergy   . Asthma   . Bilateral shoulder pain   . Borderline hypertension   . Bronchitis   . CAD (coronary artery disease)   . Chronic systolic CHF (congestive heart failure) ([place])   . Chronic systolic congestive heart failure, NYHA class 3 ([place]) 07/24/2018  . Depression 09/15/2013   Last Assessment & Plan:  Patient has been reading self help material. Looking for higher energy individuals to spend time with and no longer aloows herself to be bullied.  . Diabetes mellitus    Diagnosed in 2000  . Gangrenous appendicitis with perforation s/p lap appendectomy 10/04/2017 10/04/2017  . History of MI (myocardial infarction)   . Hyperlipidemia   . Hypertension   . Hypothyroidism   . MGUS (monoclonal gammopathy of unknown significance)   . Mitral regurgitation   . Obesity   . Sleep apnea   . Statin intolerance     ROS All review of systems negative except what is listed in the HPI  PHYSICAL EXAM General Appearance:  awake, alert, oriented, in no acute distress and obese Skin:  skin color, texture, turgor are normal, rash: erythema with slight non-draining induration approximately 65m diameter noted to the auricle of the right ear. No scaling noted presently. Mild discoloration, consistent with ecchymosis present surrounding induration, rash2: 4 mildly erythematous linear markings noted  to the posterior thoracic region to the left of the spine. No vesicles, crusting, scaling, or drainage present. Appearance consistent with excoriation.  Single erythematous papule noted in central location of erythema with no drainage, pus, or fluctuation of the skin. No pain with palpation. Consistent with insect bite.   Lungs:  Normal expansion.  Clear to auscultation.  No rales, rhonchi, or wheezing., No chest wall tenderness., No kyphosis or scoliosis. Heart:  Heart sounds are normal.  Regular rate and rhythm without murmur, gallop or rub. Psych exam:alert,oriented, in NAD with a full range of affect, normal behavior and no psychotic features  ASSESSMENT AND PLAN Problem List Items Addressed This Visit      Cardiovascular and Mediastinum   Essential hypertension    BP well controlled today at 110/64 Carvedilol 12.553m aldactone 2572mlasix 2m32mD for CVD management likely providing some control.  No concerning sx present. Discussion Low saturated fat, carbohydrate, and sodium diet Manage BG and monitor daily Report any new sx immediately Future Monitor CBC due in November        Relevant Orders   Hemoglobin A1c (Completed)   Comprehensive metabolic panel (Completed)   Chronic systolic CHF (congestive heart failure), NYHA class 3 (HCC)[place] Followed by cardiology  HF clinic.  Carvedilol, aldactone, ASA, Lasix, and Entresto daily.  Repatha due to statin intolerance.  Appears euvolemic today.  HRR, Lungs clear, no pedal edema.  Wearing compression stockings.  BP well controlled. Discussion Continue current regimen Avoid excess sodium Monitor for new or worsening symptoms (fluid overload, shortness of breath) F/U with HF clinic as directed Future F/U if sx worsen or new sx develop      Relevant Medications   Dulaglutide (TRULICITY) 3 AL/9.3XT SOPN   metFORMIN (GLUCOPHAGE) 1000 MG tablet   Other Relevant Orders   Hemoglobin A1c (Completed)   Comprehensive metabolic  panel (Completed)     Respiratory   OSA (obstructive sleep apnea)    Increased daytime sleepiness likely combination of inconsistent CPAP use and hyperglycemia.  Discussion Use CPAP nightly for entire night Daily exercise of 61mn per day increasing by 5 min every week with minimum time of 20 min per day goal Avoid driving while fatigued Follow-up with Pulmonology- over due for visit.  Future F/U with pulmonology       Relevant Orders   Hemoglobin A1c (Completed)   Comprehensive metabolic panel (Completed)   Asthma due to seasonal allergies    Asthma currently well controlled.  No rescue inhaler use in past few months. Discussion: Avoid triggers Utilize allergy medications as directed Utilize inhaler as needed Monitor for new or worsening sx Future F/U if new or worsening sx develop         Endocrine   Poorly controlled type 2 diabetes mellitus with circulatory disorder ([place]    Most recent A1c 14.0% in January.  Currently on 20u insulin glargine taking qAM.  AM BG in mid to upper 200's. Hesitancy to increase insulin due to fear of weight gain. Concern for vast beta cell destruction and minimal natural insulin secretion present. Discussion: Strict insulin adherence required for improved management.  Daily fasting blood glucose monitoring with insulin dosing increase by 2 units every 3 days FBG >140  Goal FBG 110-140 Meal coverage insulin would benefit management greatly- she declines this for now.  Significant end organ damage and increased mortality with uncontrolled BG. Strict dietary adherence. Daily physical activity- start 5327m walk/day and increase by 5 min every day with min time of 20 min per day.  Future  Consider c-peptide testing to determine beta cell function F/U in 3 months        Relevant Medications   Dulaglutide (TRULICITY) 3 MGKW/4.0XBOPN   metFORMIN (GLUCOPHAGE) 1000 MG tablet   Other Relevant Orders   Hemoglobin A1c (Completed)    Comprehensive metabolic panel (Completed)   Acquired hypothyroidism    No current symptoms present. Recent TSH levels normal Discussion Continue levothyroxine at current dose Future Monitoring q1 year since stable for 3 years. Next TSH due 06/2021        Musculoskeletal and Integument   Rash of back    Erythematous excoriation with 27m84mapule consistent with insect bite reaction. No signs of infection present. Not consistent with herpes zoster Discussion Apply triamcinolone BID for 5-7 days Monitor for pain or worsening symptoms that indicate infection. Future F/U if symptoms worsen or fail to improve.       Relevant Medications   triamcinolone ointment (KENALOG) 0.1 %     Other   Hyperlipidemia    Statin intolerant. Managed by HF clinic- Repatha Recent labs WNL Discussion Continue medication as directed Avoid diet high in saturated fat and cholesterol Future F/U as needed      Relevant  Orders   Hemoglobin A1c (Completed)   Comprehensive metabolic panel (Completed)   Claudication in peripheral vascular disease (HCC)    Compression stockings helpful Repatha for lipid management No concerning symptoms present today Discussion Continue compression stockings and medications Walk daily (see DM A&P) Monitor diet Control BG strictly with increased insulin Future F/U if symptoms worsen      Encounter to establish care - Primary    Review of current and past medical history, social history, medication, and family history.  Review of care gaps and health maintenance recommendations.  Records from recent providers to be requested if not available in Chart Review or Care Everywhere.  Recommendations for health maintenance, diet, and exercise provided.        Vitamin D deficiency    Due for DEXA Discussion DEXA orders placed Imaging will call to make appt Future Monitor Vit D levels annually Changes based on DEXA results       Other Visit Diagnoses     Screening for osteoporosis       Relevant Orders   DG Bone Density (Completed)   Need for shingles vaccine          Education provided today during visit and on AVS for patient to review at home. Diet and Exercise recommendations as well as routine health maintenance information provided.  Current diagnoses discussed and recommendations provided.   Outpatient Encounter Medications as of 01/06/2021  Medication Sig  . triamcinolone ointment (KENALOG) 0.1 % Apply 1 application topically two times daily to affected area(s) as needed, sparing use to avoid whitening/thinning skin  . [EXPIRED] Zoster Vaccine Adjuvanted [facility]) injection Inject 0.5 mLs into the muscle once for 1 dose. Repeat in 2-6 months. Please fax confirmation of vaccination to Jacolyn Reedy, DNP at [phone]  . albuterol (PROAIR HFA) 108 (90 Base) MCG/ACT inhaler Inhale 2 puffs into the lungs every 6 (six) hours as needed. wheezing  . aspirin 81 MG chewable tablet TAKE 1 TABLET BY MOUTH DAILY.  Marland Kitchen bismuth subsalicylate (PEPTO BISMOL) 262 MG chewable tablet Chew 524 mg by mouth as needed for indigestion or diarrhea or loose stools.   . carvedilol (COREG) 12.5 MG tablet TAKE 1 TABLET BY MOUTH 2 TIMES A DAY.  . diphenhydrAMINE HCl (BENADRYL ALLERGY PO) Take 1 tablet by mouth as needed.  . diphenoxylate-atropine (LOMOTIL) 2.5-0.025 MG tablet Take 1 tablet by mouth 4 (four) times daily as needed for diarrhea or loose stools.  . Dulaglutide (TRULICITY) 3 AQ/7.6AU SOPN Inject 3 mg into the skin once weekly  . Evolocumab (REPATHA SURECLICK) 633 MG/ML SOAJ Inject 1 pen into the skin every 14 (fourteen) days.  . Evolocumab 140 MG/ML SOAJ INJECT 1 PEN INTO THE SKIN EVERY 14 (FOURTEEN) DAYS.  . furosemide (LASIX) 40 MG tablet TAKE 1 TABLET (40 MG TOTAL) BY MOUTH 2 TIMES DAILY.  Marland Kitchen glucose blood test strip   . insulin glargine (SEMGLEE) 100 UNIT/ML injection Inject 0.13 mLs (13 Units total) into the skin at bedtime.  . Insulin  Glargine-yfgn 100 UNIT/ML SOPN INJECT 0.13 MLS (13 UNITS TOTAL) INTO THE SKIN AT BEDTIME.  . Insulin Glargine-yfgn 100 UNIT/ML SOPN INJECT 0.1 MLS (10 UNITS TOTAL) INTO THE SKIN DAILY.  Marland Kitchen Insulin Pen Needle 32G X 4 MM MISC USE AS DIRECTED ONCE A DAY  . levothyroxine (SYNTHROID) 50 MCG tablet TAKE 1 TABLET BY MOUTH DAILY BEFORE BREAKFAST.  . magnesium oxide (MAG-OX) 400 MG tablet Take 400 mg by mouth at bedtime as needed (for cramps).  Marland Kitchen  metFORMIN (GLUCOPHAGE) 1000 MG tablet TAKE 1 TABLET (1,000 MG TOTAL) BY MOUTH TWICE A DAY WITH A MEAL.  . Multiple Vitamin (MULITIVITAMIN WITH MINERALS) TABS Take 1 tablet by mouth daily with breakfast.  . sacubitril-valsartan (ENTRESTO) 49-51 MG TAKE 1 TABLET BY MOUTH 2 (TWO) TIMES DAILY.  Marland Kitchen spironolactone (ALDACTONE) 25 MG tablet TAKE 1 TABLET (25 MG TOTAL) BY MOUTH DAILY.  . [DISCONTINUED] cetirizine (ZYRTEC) 10 MG tablet Take 10 mg by mouth as needed.   . [DISCONTINUED] Dulaglutide (TRULICITY) 3 BU/0.3JQ SOPN Inject 3 mg into the skin once weekly  . [DISCONTINUED] insulin detemir (LEVEMIR) 100 UNIT/ML FlexPen Inject 10 Units into the skin daily.  . [DISCONTINUED] metFORMIN (GLUCOPHAGE) 1000 MG tablet TAKE 1 TABLET (1,000 MG TOTAL) BY MOUTH TWICE A DAY WITH A MEAL.   No facility-administered encounter medications on file as of 01/06/2021.   Time: 75 minutes, >50% spent counseling, care coordination, chart review, and documentation.  Significant time with review of medical history and education.  Return in about 3 months (around 04/08/2021) for DM f/u 13mowith lab appt. Lab appt today. CPE due in near future -separate visit from DM f/u.  SOrma Render DNP, AGNP-c

## 2021-01-05 NOTE — Patient Instructions (Addendum)
Recommendations from today's visit:  I recommend that you check your blood sugars every morning and increase your insulin dose by 2 units every 3 days that your blood sugars are consistently over 140. Please be diligent in increasing the insulin to help get your sugars under better control and protect your organs and heart.    I have ordered labs for you today.    Our lab is located on the ground floor of this building. From the main elevator go to the ground floor and enter through the glass doors to the left when exiting the elevator for the lab.   Judson Roch or Jaclyn Shaggy will make your lab appointment at checkout.    You are due for a complete physical exam. This exam is separate from follow-up for chronic illnesses due to insurance coverage and time limitations given with chronic illness follow-ups. You can schedule this today for a time that is convenient for you.    It appears that you are due for your shingles vaccine. This is recommended for all adults over 50 to help prevent complications from shingles virus. I have written a prescription for you to take to your pharmacy to have this done. Insurance does not cover this in the office. Please ask the cost of the vaccine before getting this- some insurances do not cover well.    You are due for a bone density scan (DEXA) to monitor for osteoporosis. I have placed the order for this and the imaging department will call you to schedule an appointment.   Our imaging department is located on the ground floor in the same location as the lab.    We will plan to follow-up with your diabetes in 3 months. Your A1c results will be listed on MyChart and I will comment on this and make any recommendations needed within 72 business hours of the results.    If you have not received a 3rd or 4th COVID booster, I strongly recommend that you have these done to best protect you from serious illness related to possible COVID-19 infection. You are considered  high risk based on your chronic illnesses and the booster series has been shown to significantly protect high risk individuals from serious illness and death associated with COVID-19.  Below is a list of the recommended health maintenance and dates due:  Health Maintenance  Topic Date Due  . COVID-19 Vaccine (3 - Pfizer risk 4-dose series) 10/08/2019  . OPHTHALMOLOGY EXAM  02/15/2021  . HEMOGLOBIN A1C  03/16/2021  . INFLUENZA VACCINE  03/28/2021  . FOOT EXAM  07/14/2021  . PAP SMEAR-Modifier  04/10/2022  . Fecal DNA (Cologuard)  04/30/2022  . MAMMOGRAM  07/14/2022  . TETANUS/TDAP  04/10/2029  . PNEUMOCOCCAL POLYSACCHARIDE VACCINE AGE 69-64 HIGH RISK  Completed  . Hepatitis C Screening  Completed  . HIV Screening  Completed  . HPV VACCINES  Aged Out     Information on diet, exercise, and health maintenance recommendations are listed below. This is information to help you be sure you are on track for optimal health and monitoring. Please look over this and let us know if you have any questions or if you have completed any of the health maintenance outside of [facility] so that we can be sure your records are up to date.  ___________________________________________________________  Thank you for choosing [facility] [facility] for your Primary Care needs. I am excited for the opportunity to partner with you to meet your health care  goals. It was a pleasure meeting you today!  I am an Adult-Geriatric Nurse Practitioner with a background in caring for patients for more than 20 years. I received my Paediatric nurse in Nursing and my Doctor of Nursing Practice degrees at [employer]. I received additional fellowship training in primary care and sports medicine after receiving my doctorate degree. I provide primary care and sports medicine services to patients age 55 and older within this office. I am also a provider with the [facility] and the director of the APP Fellowship with [facility].  I am a [place] native, but have called the [place] area home for nearly 20 years and am proud to be a member of this community.   I am passionate about providing the best service to you through preventive medicine and supportive care. I consider you a part of the medical team and value your input. I work diligently to ensure that you are heard and your needs are met in a safe and effective manner. I want you to feel comfortable with me as your provider and want you to know that your health concerns are important to me.   For your information, our office hours are Monday- Friday 8:00 AM - 5:00 PM At this time I am not in the office on Wednesdays.  If you have questions or concerns, please call our office at [phone] or send [place] a MyChart message and we will respond as quickly as possible.   For all urgent or time sensitive needs we ask that you please call the office to avoid delays. MyChart is not constantly monitored and replies may take up to 72 business hours.  MyChart Policy: . MyChart allows for you to see your visit notes, after visit summary, provider recommendations, lab and tests results, make an appointment, request refills, and contact your provider or the office for non-urgent questions or concerns.  . Providers are seeing patients during normal business hours and do not have built in time to review MyChart messages. We ask that you allow a minimum of 72 business hours for MyChart message responses.  . Complex MyChart concerns may require a visit. Your provider may request you schedule a virtual or in person visit to ensure we are providing the best care possible. . MyChart messages sent after 4:00 PM on Friday will not be received by the provider until Monday morning.    Lab and Test Results: . You will receive your lab and test results on MyChart as soon as they are completed and results have been sent by  the lab or testing facility. Due to this service, you will receive your results BEFORE your provider.  . Please allow a minimum of 72 business hours for your provider to receive and review lab and test results and contact you about.   . Most lab and test result comments from the provider will be sent through [place]. Your provider may recommend changes to the plan of care, follow-up visits, repeat testing, ask questions, or request an office visit to discuss these results. You may reply directly to this message or call the office at [phone] to provide information for the provider or set up an appointment. . In some instances, you will be called with test results and recommendations. Please let us know if this is preferred and we will make note of this in your chart to provide this for you.    . If you have not  heard a response to your lab or test results in 72 business hours, please call the office to let us know.   After Hours: . For all non-emergency after hours needs, please call the office at [phone] and select the option to reach the on-call provider service. On-call services are shared between multiple [facility] offices and therefore it will not be possible to speak directly with your provider. On-call providers may provide medical advice and recommendations, but are unable to provide refills for maintenance medications.  . For all emergency or urgent medical needs after normal business hours, we recommend that you seek care at the closest Urgent Care or Emergency Department to ensure appropriate treatment in a timely manner.  [facility] [place] [facility] has a 24 hour emergency room located on the ground floor for your convenience.    Please do not hesitate to reach out to [place] with concerns.   Thank you, again, for choosing me as your health care partner. I appreciate your trust and look forward to learning more about you.   [name], DNP,  AGNP-c ___________________________________________________________  Health Maintenance Recommendations Screening Testing  Mammogram  Every 1 -2 years based on history and risk factors  Starting at age 51  Pap Smear  Ages 21-39 every 3 years  Ages 31-65 every 5 years with HPV testing  More frequent testing may be required based on results and history  Colon Cancer Screening  Every 1-10 years based on test performed, risk factors, and history  Starting at age 72  Bone Density Screening  Every 2-10 years based on history  Starting at age 55 for women  Recommendations for men differ based on medication usage, history, and risk factors  AAA Screening  One time ultrasound  Men 92-67 years old who have every smoked  Lung Cancer Screening  Low Dose Lung CT every 12 months  Age 46-80 years with a 30 pack-year smoking history who still smoke or who have quit within the last 15 years  Screening Labs  Routine  Labs: Complete Blood Count (CBC), Complete Metabolic Panel (CMP), Cholesterol (Lipid Panel)  Every 6-12 months based on history and medications  May be recommended more frequently based on current conditions or previous results  Hemoglobin A1c Lab  Every 3-12 months based on history and previous results  Starting at age 78 or earlier with diagnosis of diabetes, high cholesterol, BMI >26, and/or risk factors  Frequent monitoring for patients with diabetes to ensure blood sugar control  Thyroid Panel (TSH w/ T3 & T4)  Every 6 months based on history, symptoms, and risk factors  May be repeated more often if on medication  HIV  One time testing for all patients 20 and older  May be repeated more frequently for patients with increased risk factors or exposure  Hepatitis C  One time testing for all patients 74 and older  May be repeated more frequently for patients with increased risk factors or exposure  Gonorrhea, Chlamydia  Every 12 months for  all sexually active persons 13-24 years  Additional monitoring may be recommended for those who are considered high risk or who have symptoms  PSA  Men 82-59 years old with risk factors  Additional screening may be recommended from age 39-69 based on risk factors, symptoms, and history  Vaccine Recommendations  Tetanus Booster  All adults every 10 years  Flu Vaccine  All patients 6 months and older every year  COVID Vaccine  All patients 12 years  and older  Initial dosing with booster  May recommend additional booster based on age and health history  HPV Vaccine  2 doses all patients age 35-26  Dosing may be considered for patients over 26  Shingles Vaccine (Shingrix)  2 doses all adults 36 years and older  Pneumonia (Pneumovax 23)  All adults 51 years and older  May recommend earlier dosing based on health history  Pneumonia (Prevnar 13)  All adults 71 years and older  Dosed 1 year after Pneumovax 23  Additional Screening, Testing, and Vaccinations may be recommended on an individualized basis based on family history, health history, risk factors, and/or exposure.  __________________________________________________________  Diet Recommendations for All Patients  I recommend that all patients maintain a diet low in saturated fats, carbohydrates, and cholesterol. While this can be challenging at first, it is not impossible and small changes can make big differences.  Things to try: Marland Kitchen Decreasing the amount of soda, sweet tea, and/or juice to one or less per day and replace with water o While water is always the first choice, if you do not like water you may consider - adding a water additive without sugar to improve the taste - other sugar free drinks . Replace potatoes with a brightly colored vegetable at dinner . Use healthy oils, such as canola oil or olive oil, instead of butter or hard margarine . Limit your bread intake to two pieces or less a  day . Replace regular pasta with low carb pasta options . Bake, broil, or grill foods instead of frying . Monitor portion sizes  . Eat smaller, more frequent meals throughout the day instead of large meals  An important thing to remember is, if you love foods that are not great for your health, you don't have to give them up completely. Instead, allow these foods to be a reward when you have done well. Allowing yourself to still have special treats every once in a while is a nice way to tell yourself thank you for working hard to keep yourself healthy.   Also remember that every day is a new day. If you have a bad day and "fall off the wagon", you can still climb right back up and keep moving along on your journey!  We have resources available to help you!  Some websites that may be helpful include: . www.http://carter.biz/  . Www.VeryWellFit.com _____________________________________________________________  Activity Recommendations for All Patients  I recommend that all adults get at least 20 minutes of moderate physical activity that elevates your heart rate at least 5 days out of the week.  Some examples include: . Walking or jogging at a pace that allows you to carry on a conversation . Cycling (stationary bike or outdoors) . Water aerobics . Yoga . Weight lifting . Dancing If physical limitations prevent you from putting stress on your joints, exercise in a pool or seated in a chair are excellent options.  Do determine your MAXIMUM heart rate for activity: YOUR AGE - 220 = MAX HeartRate   Remember! . Do not push yourself too hard.  . Start slowly and build up your pace, speed, weight, time in exercise, etc.  . Allow your body to rest between exercise and get good sleep. . You will need more water than normal when you are exerting yourself. Do not wait until you are thirsty to drink. Drink with a purpose of getting in at least 8, 8 ounce glasses of water a day plus more depending on  how much you exercise and sweat.    If you begin to develop dizziness, chest pain, abdominal pain, jaw pain, shortness of breath, headache, vision changes, lightheadedness, or other concerning symptoms, stop the activity and allow your body to rest. If your symptoms are severe, seek emergency evaluation immediately. If your symptoms are concerning, but not severe, please let us know so that we can recommend further evaluation.   ________________________________________________________________

## 2021-01-06 ENCOUNTER — Ambulatory Visit (HOSPITAL_BASED_OUTPATIENT_CLINIC_OR_DEPARTMENT_OTHER): Payer: Managed Care, Other (non HMO) | Admitting: Nurse Practitioner

## 2021-01-06 ENCOUNTER — Other Ambulatory Visit: Payer: Self-pay

## 2021-01-06 ENCOUNTER — Other Ambulatory Visit (HOSPITAL_BASED_OUTPATIENT_CLINIC_OR_DEPARTMENT_OTHER): Payer: Self-pay

## 2021-01-06 ENCOUNTER — Other Ambulatory Visit (HOSPITAL_BASED_OUTPATIENT_CLINIC_OR_DEPARTMENT_OTHER)
Admission: RE | Admit: 2021-01-06 | Discharge: 2021-01-06 | Disposition: A | Discharge status: Home / Self Care | Payer: Managed Care, Other (non HMO) | Source: Ambulatory Visit | Attending: Nurse Practitioner | Admitting: Nurse Practitioner

## 2021-01-06 ENCOUNTER — Ambulatory Visit (HOSPITAL_BASED_OUTPATIENT_CLINIC_OR_DEPARTMENT_OTHER)
Admission: RE | Admit: 2021-01-06 | Discharge: 2021-01-06 | Disposition: A | Discharge status: Home / Self Care | Payer: Managed Care, Other (non HMO) | Source: Ambulatory Visit | Attending: Nurse Practitioner | Admitting: Nurse Practitioner

## 2021-01-06 ENCOUNTER — Encounter: Payer: 59 | Admitting: Family Medicine

## 2021-01-06 ENCOUNTER — Encounter (HOSPITAL_BASED_OUTPATIENT_CLINIC_OR_DEPARTMENT_OTHER): Payer: Self-pay | Admitting: Nurse Practitioner

## 2021-01-06 VITALS — BP 110/64 | HR 96 | Ht 67.0 in | Wt 296.4 lb

## 2021-01-06 DIAGNOSIS — I739 Peripheral vascular disease, unspecified: Secondary | ICD-10-CM

## 2021-01-06 DIAGNOSIS — Z13820 Encounter for screening for osteoporosis: Secondary | ICD-10-CM

## 2021-01-06 DIAGNOSIS — G4733 Obstructive sleep apnea (adult) (pediatric): Secondary | ICD-10-CM

## 2021-01-06 DIAGNOSIS — E559 Vitamin D deficiency, unspecified: Secondary | ICD-10-CM | POA: Diagnosis not present

## 2021-01-06 DIAGNOSIS — I5022 Chronic systolic (congestive) heart failure: Secondary | ICD-10-CM

## 2021-01-06 DIAGNOSIS — E039 Hypothyroidism, unspecified: Secondary | ICD-10-CM

## 2021-01-06 DIAGNOSIS — E785 Hyperlipidemia, unspecified: Secondary | ICD-10-CM

## 2021-01-06 DIAGNOSIS — J45909 Unspecified asthma, uncomplicated: Secondary | ICD-10-CM

## 2021-01-06 DIAGNOSIS — I10 Essential (primary) hypertension: Secondary | ICD-10-CM

## 2021-01-06 DIAGNOSIS — Z7689 Persons encountering health services in other specified circumstances: Secondary | ICD-10-CM

## 2021-01-06 DIAGNOSIS — E1159 Type 2 diabetes mellitus with other circulatory complications: Secondary | ICD-10-CM

## 2021-01-06 DIAGNOSIS — E1165 Type 2 diabetes mellitus with hyperglycemia: Secondary | ICD-10-CM

## 2021-01-06 DIAGNOSIS — R21 Rash and other nonspecific skin eruption: Secondary | ICD-10-CM | POA: Insufficient documentation

## 2021-01-06 DIAGNOSIS — Z23 Encounter for immunization: Secondary | ICD-10-CM

## 2021-01-06 HISTORY — DX: Persons encountering health services in other specified circumstances: Z76.89

## 2021-01-06 HISTORY — DX: Rash and other nonspecific skin eruption: R21

## 2021-01-06 LAB — HEMOGLOBIN A1C
Hgb A1c MFr Bld: 11.4 % — ABNORMAL HIGH (ref 4.8–5.6)
Mean Plasma Glucose: 280.48 mg/dL

## 2021-01-06 LAB — COMPREHENSIVE METABOLIC PANEL
ALT: 14 U/L (ref 0–44)
AST: 12 U/L — ABNORMAL LOW (ref 15–41)
Albumin: 3.9 g/dL (ref 3.5–5.0)
Alkaline Phosphatase: 77 U/L (ref 38–126)
Anion gap: 9 (ref 5–15)
BUN: 26 mg/dL — ABNORMAL HIGH (ref 8–23)
CO2: 31 mmol/L (ref 22–32)
Calcium: 9.7 mg/dL (ref 8.9–10.3)
Chloride: 96 mmol/L — ABNORMAL LOW (ref 98–111)
Creatinine, Ser: 0.96 mg/dL (ref 0.44–1.00)
GFR, Estimated: >60 mL/min (ref >60)
Glucose, Bld: 312 mg/dL — ABNORMAL HIGH (ref 70–99)
Potassium: 4.2 mmol/L (ref 3.5–5.1)
Sodium: 136 mmol/L (ref 135–145)
Total Bilirubin: 0.3 mg/dL (ref 0.3–1.2)
Total Protein: 8 g/dL (ref 6.5–8.1)

## 2021-01-06 MED ORDER — TRULICITY 3 MG/0.5ML ~~LOC~~ SOAJ
3.0000 mg | Freq: Every day | SUBCUTANEOUS | 0 refills | Status: DC
  Filled 2021-01-06 – 2021-02-22 (×3): qty 6, 84d supply, fill #0
  Filled 2021-06-09: qty 4, 56d supply, fill #1

## 2021-01-06 MED ORDER — METFORMIN HCL 1000 MG PO TABS
ORAL_TABLET | ORAL | 1 refills | Status: DC
  Filled 2021-01-06: qty 180, 90d supply, fill #0
  Filled 2021-04-18: qty 180, 90d supply, fill #1

## 2021-01-06 MED ORDER — ZOSTER VAC RECOMB ADJUVANTED 50 MCG/0.5ML IM SUSR: 0.5000 mL | Freq: Once | INTRAMUSCULAR | 1 refills | Status: AC

## 2021-01-06 MED ORDER — TRIAMCINOLONE ACETONIDE 0.1 % EX OINT
1.0000 "application " | TOPICAL_OINTMENT | Freq: Two times a day (BID) | CUTANEOUS | 1 refills | Status: AC
  Filled 2021-01-06: qty 30, 7d supply, fill #0

## 2021-01-06 NOTE — Assessment & Plan Note (Signed)
Statin intolerant. Managed by HF clinic- Repatha Recent labs WNL Discussion Continue medication as directed Avoid diet high in saturated fat and cholesterol Future F/U as needed

## 2021-01-06 NOTE — Assessment & Plan Note (Addendum)
Followed by cardiology HF clinic.  Carvedilol, aldactone, ASA, Lasix, and Entresto daily.  Repatha due to statin intolerance.  Appears euvolemic today.  HRR, Lungs clear, no pedal edema.  Wearing compression stockings.  BP well controlled. Discussion Continue current regimen Avoid excess sodium Monitor for new or worsening symptoms (fluid overload, shortness of breath) F/U with HF clinic as directed Future F/U if sx worsen or new sx develop

## 2021-01-06 NOTE — Assessment & Plan Note (Signed)
Review of current and past medical history, social history, medication, and family history.  Review of care gaps and health maintenance recommendations.  Records from recent providers to be requested if not available in Chart Review or Care Everywhere.  Recommendations for health maintenance, diet, and exercise provided.   

## 2021-01-06 NOTE — Assessment & Plan Note (Addendum)
Increased daytime sleepiness likely combination of inconsistent CPAP use and hyperglycemia.  Discussion Use CPAP nightly for entire night Daily exercise of 45min per day increasing by 5 min every week with minimum time of 20 min per day goal Avoid driving while fatigued Follow-up with Pulmonology- over due for visit.  Future F/U with pulmonology

## 2021-01-06 NOTE — Assessment & Plan Note (Addendum)
Most recent A1c 14.0% in January.  Currently on 20u insulin glargine taking qAM.  AM BG in mid to upper 200's. Hesitancy to increase insulin due to fear of weight gain. Concern for vast beta cell destruction and minimal natural insulin secretion present. Discussion: Strict insulin adherence required for improved management.  Daily fasting blood glucose monitoring with insulin dosing increase by 2 units every 3 days FBG >140  Goal FBG 110-140 Meal coverage insulin would benefit management greatly- she declines this for now.  Significant end organ damage and increased mortality with uncontrolled BG. Strict dietary adherence. Daily physical activity- start 13min walk/day and increase by 5 min every day with min time of 20 min per day.  Future  Consider c-peptide testing to determine beta cell function F/U in 3 months

## 2021-01-06 NOTE — Assessment & Plan Note (Signed)
Due for DEXA Discussion DEXA orders placed Imaging will call to make appt Future Monitor Vit D levels annually Changes based on DEXA results

## 2021-01-06 NOTE — Assessment & Plan Note (Signed)
Compression stockings helpful Repatha for lipid management No concerning symptoms present today Discussion Continue compression stockings and medications Walk daily (see DM A&P) Monitor diet Control BG strictly with increased insulin Future F/U if symptoms worsen

## 2021-01-06 NOTE — Assessment & Plan Note (Addendum)
Asthma currently well controlled.  No rescue inhaler use in past few months. Discussion: Avoid triggers Utilize allergy medications as directed Utilize inhaler as needed Monitor for new or worsening sx Future F/U if new or worsening sx develop

## 2021-01-06 NOTE — Assessment & Plan Note (Addendum)
BP well controlled today at 110/64 Carvedilol 12.5mg , aldactone 25mg , lasix 40mg  BID for CVD management likely providing some control.  No concerning sx present. Discussion Low saturated fat, carbohydrate, and sodium diet Manage BG and monitor daily Report any new sx immediately Future Monitor CBC due in November

## 2021-01-06 NOTE — Assessment & Plan Note (Signed)
No current symptoms present. Recent TSH levels normal Discussion Continue levothyroxine at current dose Future Monitoring q1 year since stable for 3 years. Next TSH due 06/2021

## 2021-01-06 NOTE — Assessment & Plan Note (Signed)
Erythematous excoriation with 66mm papule consistent with insect bite reaction. No signs of infection present. Not consistent with herpes zoster Discussion Apply triamcinolone BID for 5-7 days Monitor for pain or worsening symptoms that indicate infection. Future F/U if symptoms worsen or fail to improve.

## 2021-01-07 ENCOUNTER — Encounter (HOSPITAL_BASED_OUTPATIENT_CLINIC_OR_DEPARTMENT_OTHER): Payer: Self-pay | Admitting: Nurse Practitioner

## 2021-01-07 ENCOUNTER — Other Ambulatory Visit (HOSPITAL_BASED_OUTPATIENT_CLINIC_OR_DEPARTMENT_OTHER): Payer: Self-pay

## 2021-01-14 NOTE — Progress Notes (Signed)
A1c improved from 4 months ago, but still significantly elevated.  Consider GLP-1 addition  BUN elevated, will need to monitor kidney function closely in the setting of uncontrolled diabetes.  May need to consider stopping metformin and changing to GLP-1

## 2021-01-19 ENCOUNTER — Telehealth (HOSPITAL_BASED_OUTPATIENT_CLINIC_OR_DEPARTMENT_OTHER): Payer: Self-pay

## 2021-01-19 NOTE — Telephone Encounter (Signed)
-----   Message from Orma Render, NP sent at 01/14/2021  5:27 PM EDT ----- DEXA normal. Plan to repeat in 2-5 years.

## 2021-01-19 NOTE — Telephone Encounter (Signed)
-----   Message from Orma Render, NP sent at 01/14/2021  5:25 PM EDT ----- A1c improved from 4 months ago, but still significantly elevated.  Consider GLP-1 addition  BUN elevated, will need to monitor kidney function closely in the setting of uncontrolled diabetes.  May need to consider stopping metformin and changing to GLP-1

## 2021-01-19 NOTE — Telephone Encounter (Signed)
Results released by Sarabeth Early, AGNP and reviewed by patient via MyChart. Instructed patient to contact the office with any questions or concerns.  

## 2021-01-21 ENCOUNTER — Other Ambulatory Visit (HOSPITAL_COMMUNITY): Payer: Self-pay

## 2021-01-21 ENCOUNTER — Other Ambulatory Visit (HOSPITAL_BASED_OUTPATIENT_CLINIC_OR_DEPARTMENT_OTHER): Payer: Self-pay | Admitting: Nurse Practitioner

## 2021-01-21 ENCOUNTER — Telehealth (HOSPITAL_BASED_OUTPATIENT_CLINIC_OR_DEPARTMENT_OTHER): Payer: Self-pay

## 2021-01-21 DIAGNOSIS — E1165 Type 2 diabetes mellitus with hyperglycemia: Secondary | ICD-10-CM

## 2021-01-21 DIAGNOSIS — E1159 Type 2 diabetes mellitus with other circulatory complications: Secondary | ICD-10-CM

## 2021-01-21 MED ORDER — ONETOUCH DELICA LANCETS 33G MISC
1 refills | Status: DC
  Filled 2021-01-21: qty 100, 90d supply, fill #0

## 2021-01-21 MED ORDER — GLUCOSE BLOOD VI STRP
ORAL_STRIP | 1 refills | Status: DC
Start: 2021-01-21 — End: 2022-02-10
  Filled 2021-01-21: qty 100, 33d supply, fill #0
  Filled 2022-01-19 (×2): qty 50, 50d supply, fill #1

## 2021-01-21 MED ORDER — BLOOD GLUCOSE METER KIT
PACK | 99 refills | Status: DC
  Filled 2021-01-21: qty 1, 30d supply, fill #0

## 2021-01-21 MED FILL — Spironolactone Tab 25 MG: ORAL | 90 days supply | Qty: 90 | Fill #0 | Status: AC

## 2021-01-21 MED FILL — Aspirin Chew Tab 81 MG: ORAL | 90 days supply | Qty: 90 | Fill #0 | Status: AC

## 2021-01-21 MED FILL — Sacubitril-Valsartan Tab 49-51 MG: ORAL | 30 days supply | Qty: 60 | Fill #1 | Status: AC

## 2021-01-21 MED FILL — Evolocumab Subcutaneous Soln Auto-Injector 140 MG/ML: SUBCUTANEOUS | 28 days supply | Qty: 2 | Fill #1 | Status: AC

## 2021-01-21 NOTE — Telephone Encounter (Signed)
Patient called in requesting refill for freesyle libre test strips Patient requests refill be sent to [facility]

## 2021-02-02 ENCOUNTER — Other Ambulatory Visit (HOSPITAL_COMMUNITY): Payer: Self-pay

## 2021-02-02 MED FILL — Levothyroxine Sodium Tab 50 MCG: ORAL | 90 days supply | Qty: 90 | Fill #0 | Status: AC

## 2021-02-03 ENCOUNTER — Other Ambulatory Visit (HOSPITAL_COMMUNITY): Payer: Self-pay

## 2021-02-03 MED FILL — Carvedilol Tab 12.5 MG: ORAL | 90 days supply | Qty: 180 | Fill #0 | Status: AC

## 2021-02-05 MED FILL — Insulin Glargine-yfgn Soln Pen-Injector 100 Unit/ML: SUBCUTANEOUS | 90 days supply | Qty: 12 | Fill #0 | Status: AC

## 2021-02-07 ENCOUNTER — Other Ambulatory Visit (HOSPITAL_COMMUNITY): Payer: Self-pay

## 2021-02-09 ENCOUNTER — Other Ambulatory Visit (HOSPITAL_COMMUNITY): Payer: Self-pay

## 2021-02-17 ENCOUNTER — Other Ambulatory Visit (HOSPITAL_COMMUNITY): Payer: Self-pay

## 2021-02-17 MED FILL — Sacubitril-Valsartan Tab 49-51 MG: ORAL | 30 days supply | Qty: 60 | Fill #2 | Status: AC

## 2021-02-17 MED FILL — Evolocumab Subcutaneous Soln Auto-Injector 140 MG/ML: SUBCUTANEOUS | 28 days supply | Qty: 2 | Fill #2 | Status: AC

## 2021-02-22 ENCOUNTER — Other Ambulatory Visit (HOSPITAL_COMMUNITY): Payer: Self-pay

## 2021-03-25 ENCOUNTER — Other Ambulatory Visit (HOSPITAL_COMMUNITY): Payer: Self-pay

## 2021-03-25 ENCOUNTER — Encounter (HOSPITAL_BASED_OUTPATIENT_CLINIC_OR_DEPARTMENT_OTHER): Payer: Self-pay | Admitting: Nurse Practitioner

## 2021-03-25 MED FILL — Furosemide Tab 40 MG: ORAL | 90 days supply | Qty: 180 | Fill #1 | Status: AC

## 2021-03-25 MED FILL — Evolocumab Subcutaneous Soln Auto-Injector 140 MG/ML: SUBCUTANEOUS | 28 days supply | Qty: 2 | Fill #3 | Status: CN

## 2021-03-25 MED FILL — Sacubitril-Valsartan Tab 49-51 MG: ORAL | 30 days supply | Qty: 60 | Fill #3 | Status: AC

## 2021-03-25 MED FILL — Insulin Glargine-yfgn Soln Pen-Injector 100 Unit/ML: SUBCUTANEOUS | 90 days supply | Qty: 12 | Fill #1 | Status: CN

## 2021-03-29 ENCOUNTER — Other Ambulatory Visit (HOSPITAL_COMMUNITY): Payer: Self-pay

## 2021-04-04 ENCOUNTER — Other Ambulatory Visit (HOSPITAL_COMMUNITY): Payer: Self-pay

## 2021-04-06 ENCOUNTER — Other Ambulatory Visit (HOSPITAL_COMMUNITY): Payer: Self-pay

## 2021-04-06 MED FILL — Evolocumab Subcutaneous Soln Auto-Injector 140 MG/ML: SUBCUTANEOUS | 28 days supply | Qty: 2 | Fill #3 | Status: CN

## 2021-04-06 NOTE — Progress Notes (Deleted)
Established Patient Office Visit  Subjective:  Patient ID: Terri Heath, female    DOB: 1956-07-15  Age: 65 y.o. MRN: [id number]  CC: No chief complaint on file.   HPI Terri Heath presents for chronic disease f/u.  DIABETES Current Medications:{RP DIABETES HBZJ:69678} {Meds; insulin types:15234} Medication Side Effects:  Taking medications as prescribed:  {YES/NO:21197::"Yes "} Glucose Monitoring: {YES/NO:21197::"Yes "}  Frequency: {Blank single:19197::"Not Checking","QAM","BID","TID","QHS"}  Average Fasting BG: Blood Pressure Monitoring:  {Blank single:19197::"not checking","rarely","daily","weekly","monthly","a few times a day","a few times a week","a few times a month"}  Hypoglycemic episodes:{Blank single:19197::"yes","no"} Polydipsia/polyuria: {Blank single:19197::"yes","no"} Visual changes: {Blank single:19197::"yes","no"} Chest pain: {Blank single:19197::"yes","no"} Paresthesias: {Blank single:19197::"yes","no"} Diabetic Diet: {Blank single:19197::"yes","no"} Exercise: {Blank single:19197::"yes","no"}  Retinal Examination: {Blank single:19197::"Up to Date","Due Now"} Foot Exam: {Blank single:19197::"Up to Date","Due Today"} Urine Microalbumin: {Blank single:19197::"Up to Date","Due Today"} Pneumovax: {Blank single:19197::"Up to Date","Due Now","unknown"} Influenza: {Blank single:19197::"Up to Date","Due Now","unknown"} COVID:{Blank single:19197::"Up to Date","Due Now","unknown"}  Last A1c: Current A1c:  Lab Results  Component Value Date   HGBA1C 11.4 (H) 01/06/2021   Weight:  Wt Readings from Last 3 Encounters:  01/06/21 296 lb 6.4 oz (134.4 kg)  10/26/20 292 lb (132.5 kg)  09/16/20 292 lb (132.5 kg)   BMI: Estimated body mass index is 46.42 kg/m as calculated from the following:   Height as of 01/06/21: 5' 7"  (1.702 m).   Weight as of 01/06/21: 296 lb 6.4 oz (134.4 kg). Cholesterol:  Lab Results  Component Value Date   CHOL 175 07/14/2020   HDL  52 07/14/2020   LDLCALC 88 07/14/2020   TRIG 208 (H) 07/14/2020   CHOLHDL 3.4 07/14/2020   Kidney Fx:  Lab Results  Component Value Date   CREATININE 0.96 01/06/2021   BUN 26 (H) 01/06/2021   NA 136 01/06/2021   K 4.2 01/06/2021   CL 96 (L) 01/06/2021   CO2 31 01/06/2021   BP:  BP Readings from Last 3 Encounters:  01/06/21 110/64  10/26/20 104/78  09/16/20 122/80    Next A1c due:  HYPERTENSION Hypertension status: {Blank single:19197::"controlled","uncontrolled","better","worse","exacerbated","stable"}  Satisfied with current treatment? {Blank single:19197::"yes","no"} Duration of hypertension: {Blank single:19197::"chronic","months","years"} BP monitoring frequency:  {Blank single:19197::"not checking","rarely","daily","weekly","monthly","a few times a day","a few times a week","a few times a month"} BP range:  BP medication side effects:  {Blank single:19197::"yes","no"} Medication compliance: {Blank single:19197::"excellent compliance","good compliance","fair compliance","poor compliance"} Aspirin: {Blank single:19197::"yes","no"} Recurrent headaches: {Blank single:19197::"yes","no"} Visual changes: {Blank single:19197::"yes","no"} Palpitations: {Blank single:19197::"yes","no"} Dyspnea: {Blank single:19197::"yes","no"} Chest pain: {Blank single:19197::"yes","no"} Lower extremity edema: {Blank single:19197::"yes","no"} Dizzy/lightheaded: {Blank single:19197::"yes","no"}  HYPERLIPIDEMIA Hyperlipidemia status: {Blank single:19197::"excellent compliance","good compliance","fair compliance","poor compliance"} Satisfied with current treatment?  {Blank single:19197::"yes","no"} Side effects:  {Blank single:19197::"yes","no"} Medication compliance: {Blank single:19197::"excellent compliance","good compliance","fair compliance","poor compliance"} Supplements: {Blank multiple:19196::"none","fish oil","niacin","red yeast rice"} Aspirin:  {Blank single:19197::"yes","no"} The  10-year ASCVD risk score Mikey Bussing DC Jr., et al., 2013) is: 9%   Values used to calculate the score:     Age: 67 years     Sex: Female     Is Non-Hispanic African American: No     Diabetic: Yes     Tobacco smoker: No     Systolic Blood Pressure: 938 mmHg     Is BP treated: Yes     HDL Cholesterol: 52 mg/dL     Total Cholesterol: 175 mg/dL Chest pain:  {Blank single:19197::"yes","no"} Coronary artery disease:  {Blank single:19197::"yes","no","unknown"} Family history CAD:  {Blank single:19197::"yes","no","unknown"} Family history [name] CAD:  {Blank single:19197::"yes","no","unknown"}   Past Medical History:  Diagnosis Date   Acute systolic heart failure ([place])  Allergy    Asthma    Bilateral shoulder pain    Borderline hypertension    Bronchitis    CAD (coronary artery disease)    Chronic systolic CHF (congestive heart failure) ([facility])    Chronic systolic congestive heart failure, NYHA class 3 ([place]) 07/24/2018   Depression 09/15/2013   Last Assessment & Plan:  Patient has been reading self help material. Looking for higher energy individuals to spend time with and no longer aloows herself to be bullied.   Diabetes mellitus    Diagnosed in 2000   Gangrenous appendicitis with perforation s/p lap appendectomy 10/04/2017 10/04/2017   History of MI (myocardial infarction)    Hyperlipidemia    Hypertension    Hypothyroidism    MGUS (monoclonal gammopathy of unknown significance)    Mitral regurgitation    Obesity    Sleep apnea    Statin intolerance     Past Surgical History:  Procedure Laterality Date   BREATH TEK H PYLORI  09/18/2011   Procedure: BREATH TEK H PYLORI;  Surgeon: Shann Medal, MD;  Location: [facility];  Service: General;  Laterality: N/A;   CESAREAN SECTION     x 3   LAPAROSCOPIC APPENDECTOMY N/A 10/03/2017   Procedure: APPENDECTOMY LAPAROSCOPIC;  Surgeon: Leighton Ruff, MD;  Location: [facility];  Service: General;  Laterality: N/A;   RIGHT/LEFT HEART CATH AND  CORONARY ANGIOGRAPHY N/A 02/12/2018   Procedure: RIGHT/LEFT HEART CATH AND CORONARY ANGIOGRAPHY;  Surgeon: Jettie Booze, MD;  Location: [facility];  Service: Cardiovascular;  Laterality: N/A;   TUBAL LIGATION      Family History  Problem Relation Age of Onset   Alcohol abuse Mother    Arthritis Mother    Diabetes Mother    Sleep apnea Mother    Anxiety disorder Mother    Eating disorder Mother    Obesity Mother    Alcohol abuse Father    Heart disease Father    Hypertension Father    Stroke Father    Cancer Father    Sleep apnea Sister    Breast cancer Neg Hx     Social History   Socioeconomic History   Marital status: Married    Spouse name: Not on file   Number of children: Not on file   Years of education: Not on file   Highest education level: Not on file  Occupational History   Not on file  Tobacco Use   Smoking status: Former    Packs/day: 1.00    Years: 15.00    Pack years: 15.00    Types: Cigarettes    Quit date: 01/28/1990    Years since quitting: 31.2   Smokeless tobacco: Never  Vaping Use   Vaping Use: Never used  Substance and Sexual Activity   Alcohol use: Yes    Comment: occasional   Drug use: No   Sexual activity: Not Currently    Birth control/protection: Post-menopausal, Abstinence  Other Topics Concern   Not on file  Social History Narrative   Lives in [place]   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 wheezing 18 g 2   aspirin 81 MG chewable tablet TAKE 1 TABLET BY MOUTH DAILY. 90 tablet 3   bismuth subsalicylate (PEPTO BISMOL) 262 MG chewable tablet Chew 524 mg by mouth as needed for indigestion or diarrhea or loose stools.      blood glucose meter kit and supplies Use up to four times daily as directed. (FOR ICD-10 E10.9, E11.9). 1 each 99   carvedilol (COREG) 12.5 MG tablet TAKE 1 TABLET BY MOUTH 2 TIMES A DAY. 180 tablet 3   diphenhydrAMINE HCl (BENADRYL ALLERGY PO) Take 1 tablet by mouth as needed.     diphenoxylate-atropine (LOMOTIL) 2.5-0.025 MG tablet Take 1 tablet by mouth 4 (four) times daily as needed for diarrhea or loose stools. 30 tablet 0   Dulaglutide (TRULICITY) 3 CB/4.4HQ SOPN Inject 3 mg into the skin once weekly 10 mL 0   Evolocumab (REPATHA SURECLICK) 759 MG/ML SOAJ Inject 1 pen into the skin every 14 (fourteen) days. 2 mL 11   Evolocumab 140 MG/ML SOAJ INJECT 1 PEN INTO THE SKIN EVERY 14 (FOURTEEN) DAYS. 2 mL 11   furosemide (LASIX) 40 MG tablet TAKE 1 TABLET (40 MG TOTAL) BY MOUTH 2 TIMES DAILY. 180 tablet 3   glucose blood test strip Use as directed to test blood sugar 100 each 1   insulin glargine (SEMGLEE) 100 UNIT/ML injection Inject 0.13 mLs (13 Units total) into the skin at bedtime. 15 mL 11   insulin glargine-yfgn (SEMGLEE) 100 UNIT/ML Pen INJECT 0.13 MLS (13 UNITS TOTAL) INTO THE SKIN AT BEDTIME. 15 mL 11   Insulin Glargine-yfgn 100 UNIT/ML SOPN INJECT 0.1 MLS (10 UNITS TOTAL) INTO THE SKIN DAILY. 15 mL 11   Insulin Pen Needle 32G X 4 MM MISC USE AS DIRECTED ONCE A DAY 100 each 3   levothyroxine (SYNTHROID) 50 MCG tablet TAKE 1 TABLET BY MOUTH DAILY BEFORE BREAKFAST. 90 tablet 3   magnesium oxide (MAG-OX) 400 MG tablet Take 400 mg by mouth at bedtime as needed (for cramps).     metFORMIN (GLUCOPHAGE) 1000 MG tablet TAKE 1 TABLET (1,000 MG TOTAL) BY MOUTH TWICE A DAY WITH A MEAL. 180 tablet 1   Multiple Vitamin (MULITIVITAMIN WITH MINERALS) TABS  Take 1 tablet by mouth daily with breakfast.     OneTouch Delica Lancets 16B MISC use as directed 100 each 1   sacubitril-valsartan (ENTRESTO) 49-51 MG TAKE 1 TABLET BY MOUTH 2 (TWO) TIMES DAILY. 60 tablet 4   spironolactone (ALDACTONE) 25 MG tablet TAKE 1 TABLET (25 MG TOTAL) BY MOUTH DAILY. 90 tablet 3   triamcinolone ointment (KENALOG) 0.1 % Apply 1 application topically two times daily to affected area(s) as needed, sparing use to avoid whitening/thinning skin 30 g 1   No facility-administered medications prior to visit.    Allergies  Allergen Reactions   Amoxicillin Other (See Comments)    Dizziness, abdominal pain   Adhesive [Tape] Rash   Exenatide Other (See Comments)    Flu-like symptoms   Invokana [Canagliflozin] Other (See Comments)    Yeast infection/dehydration   Jardiance [Empagliflozin] Other (See Comments)    Yeast infections and dehydration   Lisinopril Other (See Comments)    cramping   Other Diarrhea and Other (See Comments)    Kale=food  Per patient, it causes GI bleeding    Statins Other (See Comments)    Leg cramps    ROS Review of Systems    Objective:    Physical Exam  There were no vitals taken for this visit. Wt Readings from Last 3 Encounters:  01/06/21 296 lb 6.4 oz (134.4 kg)  10/26/20 292 lb (132.5 kg)  09/16/20 292 lb (132.5 kg)     Health Maintenance Due  Topic Date Due   HIV Screening  Never done   Zoster Vaccines- Shingrix (1 of 2) Never done   Pneumococcal Vaccine 108-27 Years old (2 - PCV) 08/25/2006   COVID-19 Vaccine (3 - Pfizer risk series) 10/08/2019   OPHTHALMOLOGY EXAM  02/15/2021   INFLUENZA VACCINE  03/28/2021    There are no preventive care reminders to display for this patient.  Lab Results  Component Value Date   TSH 1.650 07/14/2020   Lab Results  Component Value Date   WBC 8.4 09/09/2020   HGB 13.4 09/09/2020   HCT 40.5 09/09/2020   MCV 83.0 09/09/2020   PLT 303 09/09/2020   Lab Results  Component  Value Date   NA 136 01/06/2021   K 4.2 01/06/2021   CO2 31 01/06/2021   GLUCOSE 312 (H) 01/06/2021   BUN 26 (H) 01/06/2021   CREATININE 0.96 01/06/2021   BILITOT 0.3 01/06/2021   ALKPHOS 77 01/06/2021   AST 12 (L) 01/06/2021   ALT 14 01/06/2021   PROT 8.0 01/06/2021   ALBUMIN 3.9 01/06/2021   CALCIUM 9.7 01/06/2021   ANIONGAP 9 01/06/2021   GFR 78.66 01/14/2018   Lab Results  Component Value Date   CHOL 175 07/14/2020   Lab Results  Component Value Date   HDL 52 07/14/2020   Lab Results  Component Value Date   LDLCALC 88 07/14/2020   Lab Results  Component Value Date   TRIG 208 (H) 07/14/2020   Lab Results  Component Value Date   CHOLHDL 3.4 07/14/2020   Lab Results  Component Value Date   HGBA1C 11.4 (H) 01/06/2021      Assessment & Plan:   Problem List Items Addressed This Visit   None   No orders of the defined types were placed in this encounter.   Follow-up: No follow-ups on file.    Orma Render, NP

## 2021-04-08 ENCOUNTER — Encounter (HOSPITAL_BASED_OUTPATIENT_CLINIC_OR_DEPARTMENT_OTHER): Payer: Self-pay

## 2021-04-08 ENCOUNTER — Ambulatory Visit (HOSPITAL_BASED_OUTPATIENT_CLINIC_OR_DEPARTMENT_OTHER): Payer: Managed Care, Other (non HMO) | Admitting: Nurse Practitioner

## 2021-04-08 DIAGNOSIS — E1159 Type 2 diabetes mellitus with other circulatory complications: Secondary | ICD-10-CM

## 2021-04-08 DIAGNOSIS — I5022 Chronic systolic (congestive) heart failure: Secondary | ICD-10-CM

## 2021-04-08 DIAGNOSIS — E782 Mixed hyperlipidemia: Secondary | ICD-10-CM

## 2021-04-08 DIAGNOSIS — I739 Peripheral vascular disease, unspecified: Secondary | ICD-10-CM

## 2021-04-08 DIAGNOSIS — E559 Vitamin D deficiency, unspecified: Secondary | ICD-10-CM

## 2021-04-08 DIAGNOSIS — E039 Hypothyroidism, unspecified: Secondary | ICD-10-CM

## 2021-04-08 DIAGNOSIS — I10 Essential (primary) hypertension: Secondary | ICD-10-CM

## 2021-04-11 ENCOUNTER — Encounter (HOSPITAL_BASED_OUTPATIENT_CLINIC_OR_DEPARTMENT_OTHER): Payer: Self-pay | Admitting: Nurse Practitioner

## 2021-04-18 ENCOUNTER — Other Ambulatory Visit (HOSPITAL_COMMUNITY): Payer: Self-pay

## 2021-04-18 MED FILL — Insulin Glargine-yfgn Soln Pen-Injector 100 Unit/ML: SUBCUTANEOUS | 90 days supply | Qty: 12 | Fill #1 | Status: AC

## 2021-04-18 MED FILL — Spironolactone Tab 25 MG: ORAL | 90 days supply | Qty: 90 | Fill #1 | Status: AC

## 2021-04-18 MED FILL — Aspirin Chew Tab 81 MG: ORAL | 90 days supply | Qty: 90 | Fill #1 | Status: AC

## 2021-04-25 ENCOUNTER — Other Ambulatory Visit (HOSPITAL_COMMUNITY): Payer: Self-pay

## 2021-04-25 ENCOUNTER — Other Ambulatory Visit (HOSPITAL_COMMUNITY): Payer: Self-pay | Admitting: Cardiology

## 2021-04-25 MED FILL — Levothyroxine Sodium Tab 50 MCG: ORAL | 90 days supply | Qty: 90 | Fill #1 | Status: AC

## 2021-04-26 ENCOUNTER — Other Ambulatory Visit (HOSPITAL_COMMUNITY): Payer: Self-pay

## 2021-04-26 ENCOUNTER — Other Ambulatory Visit (HOSPITAL_COMMUNITY): Payer: Self-pay | Admitting: *Deleted

## 2021-04-26 ENCOUNTER — Other Ambulatory Visit (HOSPITAL_COMMUNITY): Payer: Self-pay | Admitting: Cardiology

## 2021-04-26 DIAGNOSIS — I5022 Chronic systolic (congestive) heart failure: Secondary | ICD-10-CM

## 2021-04-26 MED ORDER — CARVEDILOL 12.5 MG PO TABS
12.5000 mg | ORAL_TABLET | Freq: Two times a day (BID) | ORAL | 3 refills | Status: DC
  Filled 2021-04-26: qty 180, 90d supply, fill #0
  Filled 2021-08-08: qty 180, 90d supply, fill #1
  Filled 2021-10-31: qty 180, 90d supply, fill #2

## 2021-05-01 ENCOUNTER — Telehealth: Payer: Self-pay | Admitting: Family

## 2021-05-01 DIAGNOSIS — U071 COVID-19: Secondary | ICD-10-CM

## 2021-05-01 MED ORDER — MOLNUPIRAVIR EUA 200MG CAPSULE: 4.0000 | ORAL_CAPSULE | Freq: Two times a day (BID) | ORAL | 0 refills | Status: AC

## 2021-05-01 NOTE — Progress Notes (Signed)
Virtual Visit Consent   Terri Heath, you are scheduled for a virtual visit with a [facility] provider today.     Just as with appointments in the office, your consent must be obtained to participate.  Your consent will be active for this visit and any virtual visit you may have with one of our providers in the next 365 days.     If you have a MyChart account, a copy of this consent can be sent to you electronically.  All virtual visits are billed to your insurance company just like a traditional visit in the office.    As this is a virtual visit, video technology does not allow for your provider to perform a traditional examination.  This may limit your provider's ability to fully assess your condition.  If your provider identifies any concerns that need to be evaluated in person or the need to arrange testing (such as labs, EKG, etc.), we will make arrangements to do so.     Although advances in technology are sophisticated, we cannot ensure that it will always work on either your end or our end.  If the connection with a video visit is poor, the visit may have to be switched to a telephone visit.  With either a video or telephone visit, we are not always able to ensure that we have a secure connection.     I need to obtain your verbal consent now.   Are you willing to proceed with your visit today?    Terri Heath has provided verbal consent on 05/01/2021 for a virtual visit (video or telephone).   Evelina Dun, FNP   Date: 05/01/2021 10:35 AM   Virtual Visit via Video Note   I, Evelina Dun, connected with  Terri Heath  ([id number], 04-07-1956) on 05/01/21 at 10:00 AM EDT by a video-enabled telemedicine application and verified that I am speaking with the correct person using two identifiers.  Location: Patient: Virtual Visit Location Patient: Home Provider: Virtual Visit Location Provider: Home   I discussed the limitations of evaluation and management by telemedicine  and the availability of in person appointments. The patient expressed understanding and agreed to proceed.    History of Present Illness: Terri Heath is a 65 y.o. who identifies as a female who was assigned female at birth, and is being seen today for COVID. She reports her husband tested positive this morning and her son last week. She report her symptoms started yesterday and tested positive today.   HPI: Cough This is a new problem. The current episode started yesterday. The problem has been gradually worsening. The problem occurs every few minutes. The cough is Non-productive. Associated symptoms include a fever, headaches, myalgias, nasal congestion, postnasal drip and a sore throat. Pertinent negatives include no chills, ear congestion, ear pain or shortness of breath. She has tried rest for the symptoms. The treatment provided mild relief.   Problems:  Patient Active Problem List   Diagnosis Date Noted   Encounter to establish care 01/06/2021   Vitamin D deficiency 01/06/2021   Rash of back 01/06/2021   Claudication in peripheral vascular disease ([place]) 92/33/0076   Chronic systolic CHF (congestive heart failure), NYHA class 3 ([place]) 07/24/2018   Acquired hypothyroidism 07/24/2018   Cardiomyopathy, ischemic 07/24/2018   MGUS (monoclonal gammopathy of unknown significance) 03/06/2018   Dyspnea 01/14/2018   Allergic rhinitis 06/11/2014   Asthma due to seasonal allergies 10/24/2012   OSA (obstructive sleep apnea) 05/23/2011  Poorly controlled type 2 diabetes mellitus with circulatory disorder ([place]) 03/26/2007   Hyperlipidemia 03/26/2007   Essential hypertension 03/26/2007    Allergies:  Allergies  Allergen Reactions   Amoxicillin Other (See Comments)    Dizziness, abdominal pain   Adhesive [Tape] Rash   Exenatide Other (See Comments)    Flu-like symptoms   Invokana [Canagliflozin] Other (See Comments)    Yeast infection/dehydration   Jardiance [Empagliflozin] Other (See  Comments)    Yeast infections and dehydration   Lisinopril Other (See Comments)    cramping   Other Diarrhea and Other (See Comments)    Kale=food  Per patient, it causes GI bleeding    Statins Other (See Comments)    Leg cramps   Medications:  Current Outpatient Medications:    molnupiravir EUA 200 mg CAPS, Take 4 capsules (800 mg total) by mouth 2 (two) times daily for 5 days., Disp: 40 capsule, Rfl: 0   albuterol (PROAIR HFA) 108 (90 Base) MCG/ACT inhaler, Inhale 2 puffs into the lungs every 6 (six) hours as needed. wheezing, Disp: 18 g, Rfl: 2   aspirin 81 MG chewable tablet, TAKE 1 TABLET BY MOUTH DAILY., Disp: 90 tablet, Rfl: 3   bismuth subsalicylate (PEPTO BISMOL) 262 MG chewable tablet, Chew 524 mg by mouth as needed for indigestion or diarrhea or loose stools. , Disp: , Rfl:    blood glucose meter kit and supplies, Use up to four times daily as directed. (FOR ICD-10 E10.9, E11.9)., Disp: 1 each, Rfl: 99   carvedilol (COREG) 12.5 MG tablet, Take 1 tablet (12.5 mg total) by mouth 2 (two) times daily., Disp: 180 tablet, Rfl: 3   diphenhydrAMINE HCl (BENADRYL ALLERGY PO), Take 1 tablet by mouth as needed., Disp: , Rfl:    diphenoxylate-atropine (LOMOTIL) 2.5-0.025 MG tablet, Take 1 tablet by mouth 4 (four) times daily as needed for diarrhea or loose stools., Disp: 30 tablet, Rfl: 0   Dulaglutide (TRULICITY) 3 MM/7.6KG SOPN, Inject 3 mg into the skin once weekly, Disp: 10 mL, Rfl: 0   Evolocumab (REPATHA SURECLICK) 881 MG/ML SOAJ, Inject 1 pen into the skin every 14 (fourteen) days., Disp: 2 mL, Rfl: 11   Evolocumab 140 MG/ML SOAJ, INJECT 1 PEN INTO THE SKIN EVERY 14 (FOURTEEN) DAYS., Disp: 2 mL, Rfl: 11   furosemide (LASIX) 40 MG tablet, TAKE 1 TABLET (40 MG TOTAL) BY MOUTH 2 TIMES DAILY., Disp: 180 tablet, Rfl: 3   glucose blood test strip, Use as directed to test blood sugar, Disp: 100 each, Rfl: 1   insulin glargine (SEMGLEE) 100 UNIT/ML injection, Inject 0.13 mLs (13 Units total)  into the skin at bedtime., Disp: 15 mL, Rfl: 11   insulin glargine-yfgn (SEMGLEE) 100 UNIT/ML Pen, INJECT 0.13 MLS (13 UNITS TOTAL) INTO THE SKIN AT BEDTIME., Disp: 15 mL, Rfl: 11   Insulin Glargine-yfgn 100 UNIT/ML SOPN, INJECT 0.1 MLS (10 UNITS TOTAL) INTO THE SKIN DAILY., Disp: 15 mL, Rfl: 11   Insulin Pen Needle 32G X 4 MM MISC, USE AS DIRECTED ONCE A DAY, Disp: 100 each, Rfl: 3   levothyroxine (SYNTHROID) 50 MCG tablet, TAKE 1 TABLET BY MOUTH DAILY BEFORE BREAKFAST., Disp: 90 tablet, Rfl: 3   magnesium oxide (MAG-OX) 400 MG tablet, Take 400 mg by mouth at bedtime as needed (for cramps)., Disp: , Rfl:    metFORMIN (GLUCOPHAGE) 1000 MG tablet, TAKE 1 TABLET (1,000 MG TOTAL) BY MOUTH TWICE A DAY WITH A MEAL., Disp: 180 tablet, Rfl: 1   Multiple Vitamin (  MULITIVITAMIN WITH MINERALS) TABS, Take 1 tablet by mouth daily with breakfast., Disp: , Rfl:    OneTouch Delica Lancets 79D MISC, use as directed, Disp: 100 each, Rfl: 1   sacubitril-valsartan (ENTRESTO) 49-51 MG, TAKE 1 TABLET BY MOUTH 2 (TWO) TIMES DAILY., Disp: 60 tablet, Rfl: 4   spironolactone (ALDACTONE) 25 MG tablet, TAKE 1 TABLET (25 MG TOTAL) BY MOUTH DAILY., Disp: 90 tablet, Rfl: 3   triamcinolone ointment (KENALOG) 0.1 %, Apply 1 application topically two times daily to affected area(s) as needed, sparing use to avoid whitening/thinning skin, Disp: 30 g, Rfl: 1  Observations/Objective: Patient is well-developed, well-nourished in no acute distress.  Resting comfortably  at home.  Head is normocephalic, atraumatic.  No labored breathing.  Speech is clear and coherent with logical content.  Patient is alert and oriented at baseline.  Intermittent dry cough  Assessment and Plan: 1. COVID - molnupiravir EUA 200 mg CAPS; Take 4 capsules (800 mg total) by mouth 2 (two) times daily for 5 days.  Dispense: 40 capsule; Refill: 0 COVID positive, rest, force fluids, tylenol as needed, Quarantine for at least 5 days and fever free, and then  wear mask for 5 days report any worsening symptoms such as increased shortness of breath, swelling, or continued high fevers.  Discussed possible adverse effects    Follow Up Instructions: I discussed the assessment and treatment plan with the patient. The patient was provided an opportunity to ask questions and all were answered. The patient agreed with the plan and demonstrated an understanding of the instructions.  A copy of instructions were sent to the patient via MyChart.  The patient was advised to call back or seek an in-person evaluation if the symptoms worsen or if the condition fails to improve as anticipated.  Time:  I spent 12 minutes with the patient via telehealth technology discussing the above problems/concerns.    Evelina Dun, FNP

## 2021-06-09 ENCOUNTER — Encounter (HOSPITAL_BASED_OUTPATIENT_CLINIC_OR_DEPARTMENT_OTHER): Payer: Self-pay | Admitting: Nurse Practitioner

## 2021-06-09 ENCOUNTER — Other Ambulatory Visit (HOSPITAL_COMMUNITY): Payer: Self-pay | Admitting: Cardiology

## 2021-06-09 ENCOUNTER — Other Ambulatory Visit (HOSPITAL_COMMUNITY): Payer: Self-pay

## 2021-06-09 ENCOUNTER — Ambulatory Visit (INDEPENDENT_AMBULATORY_CARE_PROVIDER_SITE_OTHER): Payer: 59 | Admitting: Nurse Practitioner

## 2021-06-09 ENCOUNTER — Other Ambulatory Visit (HOSPITAL_BASED_OUTPATIENT_CLINIC_OR_DEPARTMENT_OTHER): Payer: Self-pay

## 2021-06-09 ENCOUNTER — Other Ambulatory Visit: Payer: Self-pay

## 2021-06-09 VITALS — BP 118/72 | HR 88 | Ht 67.0 in | Wt 301.6 lb

## 2021-06-09 DIAGNOSIS — E1159 Type 2 diabetes mellitus with other circulatory complications: Secondary | ICD-10-CM

## 2021-06-09 DIAGNOSIS — I5022 Chronic systolic (congestive) heart failure: Secondary | ICD-10-CM | POA: Diagnosis not present

## 2021-06-09 DIAGNOSIS — E1165 Type 2 diabetes mellitus with hyperglycemia: Secondary | ICD-10-CM

## 2021-06-09 DIAGNOSIS — I10 Essential (primary) hypertension: Secondary | ICD-10-CM

## 2021-06-09 DIAGNOSIS — Z23 Encounter for immunization: Secondary | ICD-10-CM

## 2021-06-09 MED ORDER — INSULIN GLARGINE-YFGN 100 UNIT/ML ~~LOC~~ SOPN
25.0000 [IU] | PEN_INJECTOR | Freq: Every day | SUBCUTANEOUS | 11 refills | Status: DC
Start: 2021-06-09 — End: 2022-01-05
  Filled 2021-06-09 – 2021-06-16 (×3): qty 15, 37d supply, fill #0
  Filled 2021-08-26: qty 15, 37d supply, fill #1
  Filled 2021-10-31: qty 15, 37d supply, fill #2
  Filled 2022-01-05: qty 15, 37d supply, fill #3

## 2021-06-09 MED ORDER — METFORMIN HCL 1000 MG PO TABS
1000.0000 mg | ORAL_TABLET | Freq: Two times a day (BID) | ORAL | 3 refills | Status: DC
Start: 2021-06-09 — End: 2022-02-10
  Filled 2021-06-09 – 2021-07-04 (×2): qty 180, 90d supply, fill #0
  Filled 2021-10-31: qty 180, 90d supply, fill #1
  Filled 2022-01-19: qty 180, 90d supply, fill #2

## 2021-06-09 MED ORDER — TRULICITY 3 MG/0.5ML ~~LOC~~ SOAJ
3.0000 mg | SUBCUTANEOUS | 3 refills | Status: DC
  Filled 2021-06-09: qty 2, 28d supply, fill #0
  Filled 2021-06-09: qty 6, 84d supply, fill #0
  Filled 2021-07-19: qty 6, 84d supply, fill #1
  Filled 2021-09-30: qty 6, 84d supply, fill #2
  Filled 2021-10-07: qty 2, 28d supply, fill #2
  Filled 2021-10-31: qty 2, 28d supply, fill #3
  Filled 2021-12-07: qty 2, 28d supply, fill #4
  Filled 2021-12-30: qty 6, 84d supply, fill #5
  Filled 2022-03-23: qty 2, 28d supply, fill #6
  Filled 2022-04-19: qty 2, 28d supply, fill #7

## 2021-06-09 NOTE — Patient Instructions (Signed)
We will check your labs in 3 months. I have sent medications to the pharmacy for you.   Let me know if you have any questions or concerns.   Look up Trulicity on the internet to get the additional coupon information. It looks like they have a $25 offer for three month supply through their website.

## 2021-06-09 NOTE — Assessment & Plan Note (Signed)
Blood pressure is well controlled today at 1 1 8-72. No changes to medication plan of care at this time. We will plan to get labs at her next visit approximately 3 months.

## 2021-06-09 NOTE — Assessment & Plan Note (Addendum)
Unfortunately she has been unable to maintain routinely on her medications due to recent job loss and loss of insurance. We will plan to restart medications today and get her back on track with this. She is doing fantastic monitoring her diet and working with her son to make changes and improvements in this manner. Given that she has been off of her medication I feel that it is not beneficial to get labs today therefore we will defer to the next visit in approximately 3 months. Samples provided today for the patient for both Trulicity and insulin glargine (Basaglar). Recommendation for increasing insulin glargine provided to the patient.  She should plan to increase by 2 units every 3 days for blood sugars consistently greater than 180 mg/dL.  She is currently taking 25 units once a day. Recommend monitoring blood sugars at least once a day with fasting glucose levels. I do feel that the patient would benefit from a continuous blood glucose monitor and will plan to discuss this with her to see if her insurance policy will cover it.  I believe this will help her see triggers for her highs and help in her diet and activity changes for improved control. At next visit with labs consider C-peptide test for beta cell function given the difficulty to control her diabetes. She received her flu shot today.

## 2021-06-09 NOTE — Progress Notes (Signed)
Established Patient Office Visit  Subjective:  Patient ID: Terri Heath, female    DOB: [birth year]  Age: 65 y.o. MRN: [id number]  CC:  Chief Complaint  Patient presents with   Medication Refill    HPI Terri Heath presents for DM f/u.  Lives reports that things have been quite difficult over the last few months. She lost her job this past spring and with it lost her health insurance.   These changes have caused some financial difficulty as well as difficulty in obtaining medications. She did have a few prescriptions on hand of her Trulicity however she ran out of that in [name] September and has not been on that medication since that time. She has been taking her insulin glargine on a routine basis however reports that her blood sugars when she has been checking them have been extremely elevated.  This morning her blood sugar was 290.  She did take 1 dose of her insulin glargine which brought it down to 260 after about 2 hours and this was without eating.  Due to the job loss she has subsequently retired and she has obtained help insurance at this point. She has been monitoring her diet closely she is avoiding potatoes, rice, noodles.  She and her son are working together on this. She would like to hold off on having labs done as she has been out of her medication and she is sure that her diabetes is not in control at this time. She is not experiencing any new symptoms of her diabetes at this time.  Past Medical History:  Diagnosis Date   Acute systolic heart failure (HCC)    Allergy    Asthma    Bilateral shoulder pain    Borderline hypertension    Bronchitis    CAD (coronary artery disease)    Chronic systolic CHF (congestive heart failure) (HCC)    Chronic systolic congestive heart failure, NYHA class 3 ([place]) 07/24/2018   Depression 09/15/2013   Last Assessment & Plan:  Patient has been reading self help material. Looking for higher energy individuals to spend time  with and no longer aloows herself to be bullied.   Diabetes mellitus    Diagnosed in 2000   Gangrenous appendicitis with perforation s/p lap appendectomy 10/04/2017 10/04/2017   History of MI (myocardial infarction)    Hyperlipidemia    Hypertension    Hypothyroidism    MGUS (monoclonal gammopathy of unknown significance)    Mitral regurgitation    Obesity    Sleep apnea    Statin intolerance     Past Surgical History:  Procedure Laterality Date   BREATH TEK H PYLORI  09/18/2011   Procedure: BREATH TEK H PYLORI;  Surgeon: Shann Medal, MD;  Location: [facility];  Service: General;  Laterality: N/A;   CESAREAN SECTION     x 3   LAPAROSCOPIC APPENDECTOMY N/A 10/03/2017   Procedure: APPENDECTOMY LAPAROSCOPIC;  Surgeon: Leighton Ruff, MD;  Location: [facility];  Service: General;  Laterality: N/A;   RIGHT/LEFT HEART CATH AND CORONARY ANGIOGRAPHY N/A 02/12/2018   Procedure: RIGHT/LEFT HEART CATH AND CORONARY ANGIOGRAPHY;  Surgeon: Jettie Booze, MD;  Location: [facility];  Service: Cardiovascular;  Laterality: N/A;   TUBAL LIGATION      Outpatient Medications Prior to Visit  Medication Sig Dispense Refill   albuterol (PROAIR HFA) 108 (90 Base) MCG/ACT inhaler Inhale 2 puffs into the lungs every 6 (six) hours as needed. wheezing 18 g  2   aspirin 81 MG chewable tablet TAKE 1 TABLET BY MOUTH DAILY. 90 tablet 3   bismuth subsalicylate (PEPTO BISMOL) 262 MG chewable tablet Chew 524 mg by mouth as needed for indigestion or diarrhea or loose stools.      blood glucose meter kit and supplies Use up to four times daily as directed. (FOR ICD-10 E10.9, E11.9). 1 each 99   carvedilol (COREG) 12.5 MG tablet Take 1 tablet (12.5 mg total) by mouth 2 (two) times daily. 180 tablet 3   diphenhydrAMINE HCl (BENADRYL ALLERGY PO) Take 1 tablet by mouth as needed.     diphenoxylate-atropine (LOMOTIL) 2.5-0.025 MG tablet Take 1 tablet by mouth 4 (four) times daily as needed for diarrhea or loose  stools. 30 tablet 0   Evolocumab (REPATHA SURECLICK) 741 MG/ML SOAJ Inject 1 pen into the skin every 14 (fourteen) days. 2 mL 11   furosemide (LASIX) 40 MG tablet TAKE 1 TABLET (40 MG TOTAL) BY MOUTH 2 TIMES DAILY. 180 tablet 3   glucose blood test strip Use as directed to test blood sugar 100 each 1   Insulin Pen Needle 32G X 4 MM MISC USE AS DIRECTED ONCE A DAY 100 each 3   levothyroxine (SYNTHROID) 50 MCG tablet TAKE 1 TABLET BY MOUTH DAILY BEFORE BREAKFAST. 90 tablet 3   magnesium oxide (MAG-OX) 400 MG tablet Take 400 mg by mouth at bedtime as needed (for cramps).     Multiple Vitamin (MULITIVITAMIN WITH MINERALS) TABS Take 1 tablet by mouth daily with breakfast.     OneTouch Delica Lancets 63A MISC use as directed 100 each 1   sacubitril-valsartan (ENTRESTO) 49-51 MG TAKE 1 TABLET BY MOUTH 2 (TWO) TIMES DAILY. 60 tablet 4   spironolactone (ALDACTONE) 25 MG tablet TAKE 1 TABLET (25 MG TOTAL) BY MOUTH DAILY. 90 tablet 3   triamcinolone ointment (KENALOG) 0.1 % Apply 1 application topically two times daily to affected area(s) as needed, sparing use to avoid whitening/thinning skin 30 g 1   Dulaglutide (TRULICITY) 3 GT/3.6IW SOPN Inject 3 mg into the skin once weekly 10 mL 0   insulin glargine (SEMGLEE) 100 UNIT/ML injection Inject 0.13 mLs (13 Units total) into the skin at bedtime. 15 mL 11   insulin glargine-yfgn (SEMGLEE) 100 UNIT/ML Pen INJECT 0.13 MLS (13 UNITS TOTAL) INTO THE SKIN AT BEDTIME. 15 mL 11   Insulin Glargine-yfgn 100 UNIT/ML SOPN INJECT 0.1 MLS (10 UNITS TOTAL) INTO THE SKIN DAILY. 15 mL 11   metFORMIN (GLUCOPHAGE) 1000 MG tablet TAKE 1 TABLET (1,000 MG TOTAL) BY MOUTH TWICE A DAY WITH A MEAL. 180 tablet 1   No facility-administered medications prior to visit.    ROS Review of Systems All review of systems negative except what is listed in the HPI    Objective:    Physical Exam Vitals and nursing note reviewed.  Constitutional:      General: She is not in acute  distress.    Appearance: Normal appearance. She is obese.  HENT:     Head: Normocephalic and atraumatic.  Eyes:     Extraocular Movements: Extraocular movements intact.     Conjunctiva/sclera: Conjunctivae normal.     Pupils: Pupils are equal, round, and reactive to light.  Neck:     Vascular: No carotid bruit.  Cardiovascular:     Rate and Rhythm: Normal rate and regular rhythm.     Pulses: Normal pulses.     Heart sounds: Normal heart sounds. No murmur heard. Pulmonary:  Effort: Pulmonary effort is normal.     Breath sounds: Normal breath sounds. No wheezing.  Abdominal:     General: Bowel sounds are normal.  Musculoskeletal:        General: Normal range of motion.     Right lower leg: No edema.     Left lower leg: No edema.  Skin:    General: Skin is warm and dry.     Capillary Refill: Capillary refill takes less than 2 seconds.  Neurological:     General: No focal deficit present.     Mental Status: She is alert and oriented to person, place, and time.     Sensory: No sensory deficit.     Motor: No weakness.     Coordination: Coordination normal.     Gait: Gait normal.  Psychiatric:        Mood and Affect: Affect is tearful.        Behavior: Behavior normal.        Thought Content: Thought content normal.        Judgment: Judgment normal.    BP 118/72   Pulse 88   Ht _0  (1.702 m)   Wt (!) 301 lb 9.6 oz (136.8 kg)   SpO2 96%   BMI 47.24 kg/m  Wt Readings from Last 3 Encounters:  06/09/21 (!) 301 lb 9.6 oz (136.8 kg)  01/06/21 296 lb 6.4 oz (134.4 kg)  10/26/20 292 lb (132.5 kg)    There are no preventive care reminders to display for this patient.  Lab Results  Component Value Date   TSH 1.650 07/14/2020   Lab Results  Component Value Date   WBC 8.4 09/09/2020   HGB 13.4 09/09/2020   HCT 40.5 09/09/2020   MCV 83.0 09/09/2020   PLT 303 09/09/2020   Lab Results  Component Value Date   NA 136 01/06/2021   K 4.2 01/06/2021   CO2 31  01/06/2021   GLUCOSE 312 (H) 01/06/2021   BUN 26 (H) 01/06/2021   CREATININE 0.96 01/06/2021   BILITOT 0.3 01/06/2021   ALKPHOS 77 01/06/2021   AST 12 (L) 01/06/2021   ALT 14 01/06/2021   PROT 8.0 01/06/2021   ALBUMIN 3.9 01/06/2021   CALCIUM 9.7 01/06/2021   ANIONGAP 9 01/06/2021   GFR 78.66 01/14/2018   Lab Results  Component Value Date   CHOL 175 07/14/2020   Lab Results  Component Value Date   HDL 52 07/14/2020   Lab Results  Component Value Date   LDLCALC 88 07/14/2020   Lab Results  Component Value Date   TRIG 208 (H) 07/14/2020   Lab Results  Component Value Date   CHOLHDL 3.4 07/14/2020   Lab Results  Component Value Date   HGBA1C 11.4 (H) 01/06/2021      Assessment & Plan:   Problem List Items Addressed This Visit     Poorly controlled type 2 diabetes mellitus with circulatory disorder ([place]) - Primary    Unfortunately she has been unable to maintain routinely on her medications due to recent job loss and loss of insurance. We will plan to restart medications today and get her back on track with this. She is doing fantastic monitoring her diet and working with her son to make changes and improvements in this manner. Given that she has been off of her medication I feel that it is not beneficial to get labs today therefore we will defer to the next visit in approximately 3 months. Samples  provided today for the patient for both Trulicity and insulin glargine (Basaglar). Recommendation for increasing insulin glargine provided to the patient.  She should plan to increase by 2 units every 3 days for blood sugars consistently greater than 180 mg/dL.  She is currently taking 25 units once a day. Recommend monitoring blood sugars at least once a day with fasting glucose levels. I do feel that the patient would benefit from a continuous blood glucose monitor and will plan to discuss this with her to see if her insurance policy will cover it.  I believe this will  help her see triggers for her highs and help in her diet and activity changes for improved control. At next visit with labs consider C-peptide test for beta cell function given the difficulty to control her diabetes. She received her flu shot today.      Relevant Medications   insulin glargine-yfgn (SEMGLEE) 100 UNIT/ML Pen   metFORMIN (GLUCOPHAGE) 1000 MG tablet   Dulaglutide (TRULICITY) 3 CH/8.8FO SOPN   Essential hypertension    Blood pressure is well controlled today at 1 1 8-72. No changes to medication plan of care at this time. We will plan to get labs at her next visit approximately 3 months.      Chronic systolic CHF (congestive heart failure), NYHA class 3 (HCC)    No signs of worsening heart failure conditions. She has no edema or signs of fluid overload present today. We will plan to get labs at her next visit.      Relevant Medications   metFORMIN (GLUCOPHAGE) 1000 MG tablet   Dulaglutide (TRULICITY) 3 YD/7.4JO SOPN   Other Visit Diagnoses     Need for immunization against influenza       Relevant Orders   Flu Vaccine QUAD 42moIM (Fluarix, Fluzone & Alfiuria Quad PF) (Completed)       Meds ordered this encounter  Medications   insulin glargine-yfgn (SEMGLEE) 100 UNIT/ML Pen    Sig: Inject 25-40 Units into the skin daily based on blood sugar levels via sliding scale.    Dispense:  15 mL    Refill:  11   metFORMIN (GLUCOPHAGE) 1000 MG tablet    Sig: Take 1 tablet (1,000 mg total) by mouth 2 (two) times daily with a meal.    Dispense:  180 tablet    Refill:  3   Dulaglutide (TRULICITY) 3 MIN/8.6VESOPN    Sig: Inject 3 mg into the skin once a week.    Dispense:  6 mL    Refill:  3   Medication Samples have been provided to the patient.  Drug name: Basaglar (insulin glargine)       Strength: 100u/mL        Qty: 1 pen  LOT: [id number]  Exp.Date: 01/2022  Dosing instructions: Give 25-40 units once per day based on blood glucose readings. Start with 25u and  increase by 2 units every 3 days for consistent fasting blood sugars greater than 180. If needing more than 40 u please notify office.   The patient has been instructed regarding the correct time, dose, and frequency of taking this medication, including desired effects and most common side effects.   Drug name: Trulicity Strength: 19.6GEQt: 2 boxes Lot: [id number] Exp: 12/21/2021 Directions: Inject 3 mg once weekly as directed for blood sugar control.  SCoralee PesaEarly 1:28 PM 06/09/2021 Time: 30 minutes, >50% spent counseling, care coordination, chart review, and documentation.    Follow-up: Return in  about 3 months (around 09/09/2021).    Orma Render, NP

## 2021-06-09 NOTE — Assessment & Plan Note (Signed)
No signs of worsening heart failure conditions. She has no edema or signs of fluid overload present today. We will plan to get labs at her next visit.

## 2021-06-15 ENCOUNTER — Other Ambulatory Visit (HOSPITAL_BASED_OUTPATIENT_CLINIC_OR_DEPARTMENT_OTHER): Payer: Self-pay | Admitting: Nurse Practitioner

## 2021-06-15 ENCOUNTER — Other Ambulatory Visit (HOSPITAL_COMMUNITY): Payer: Self-pay

## 2021-06-15 ENCOUNTER — Other Ambulatory Visit (HOSPITAL_COMMUNITY): Payer: Self-pay | Admitting: Cardiology

## 2021-06-15 MED ORDER — ENTRESTO 49-51 MG PO TABS
1.0000 | ORAL_TABLET | Freq: Two times a day (BID) | ORAL | 4 refills | Status: DC
  Filled 2021-06-15: qty 60, 30d supply, fill #0
  Filled 2021-08-17: qty 60, 30d supply, fill #1
  Filled 2021-09-29: qty 60, 30d supply, fill #2
  Filled 2021-10-31: qty 60, 30d supply, fill #3
  Filled 2021-12-26: qty 60, 30d supply, fill #4

## 2021-06-15 MED ORDER — REPATHA SURECLICK 140 MG/ML ~~LOC~~ SOAJ
SUBCUTANEOUS | 11 refills | Status: DC
  Filled 2021-06-15 – 2021-06-17 (×2): qty 2, 28d supply, fill #0

## 2021-06-16 ENCOUNTER — Other Ambulatory Visit (HOSPITAL_COMMUNITY): Payer: Self-pay

## 2021-06-17 ENCOUNTER — Other Ambulatory Visit (HOSPITAL_COMMUNITY): Payer: Self-pay

## 2021-06-20 ENCOUNTER — Other Ambulatory Visit (HOSPITAL_COMMUNITY): Payer: Self-pay

## 2021-06-23 ENCOUNTER — Other Ambulatory Visit (HOSPITAL_COMMUNITY): Payer: Self-pay

## 2021-06-23 ENCOUNTER — Ambulatory Visit (HOSPITAL_COMMUNITY)
Admission: RE | Admit: 2021-06-23 | Discharge: 2021-06-23 | Disposition: A | Discharge status: Home / Self Care | Payer: 59 | Source: Ambulatory Visit | Attending: Cardiology | Admitting: Cardiology

## 2021-06-23 ENCOUNTER — Other Ambulatory Visit: Payer: Self-pay

## 2021-06-23 ENCOUNTER — Encounter (HOSPITAL_COMMUNITY): Payer: Self-pay | Admitting: Cardiology

## 2021-06-23 ENCOUNTER — Ambulatory Visit (HOSPITAL_BASED_OUTPATIENT_CLINIC_OR_DEPARTMENT_OTHER)
Admission: RE | Admit: 2021-06-23 | Discharge: 2021-06-23 | Disposition: A | Discharge status: Home / Self Care | Payer: 59 | Source: Ambulatory Visit | Attending: Cardiology | Admitting: Cardiology

## 2021-06-23 ENCOUNTER — Telehealth: Payer: Self-pay | Admitting: Pharmacist

## 2021-06-23 VITALS — BP 92/64 | HR 97 | Wt 300.8 lb

## 2021-06-23 DIAGNOSIS — Z7982 Long term (current) use of aspirin: Secondary | ICD-10-CM | POA: Diagnosis not present

## 2021-06-23 DIAGNOSIS — Z7985 Long-term (current) use of injectable non-insulin antidiabetic drugs: Secondary | ICD-10-CM | POA: Diagnosis not present

## 2021-06-23 DIAGNOSIS — I2510 Atherosclerotic heart disease of native coronary artery without angina pectoris: Secondary | ICD-10-CM | POA: Insufficient documentation

## 2021-06-23 DIAGNOSIS — I5022 Chronic systolic (congestive) heart failure: Secondary | ICD-10-CM | POA: Insufficient documentation

## 2021-06-23 DIAGNOSIS — I110 Hypertensive heart disease with heart failure: Secondary | ICD-10-CM | POA: Insufficient documentation

## 2021-06-23 DIAGNOSIS — Z8616 Personal history of COVID-19: Secondary | ICD-10-CM | POA: Diagnosis not present

## 2021-06-23 DIAGNOSIS — R0602 Shortness of breath: Secondary | ICD-10-CM | POA: Diagnosis present

## 2021-06-23 DIAGNOSIS — Z7984 Long term (current) use of oral hypoglycemic drugs: Secondary | ICD-10-CM | POA: Diagnosis not present

## 2021-06-23 DIAGNOSIS — E119 Type 2 diabetes mellitus without complications: Secondary | ICD-10-CM | POA: Insufficient documentation

## 2021-06-23 DIAGNOSIS — E785 Hyperlipidemia, unspecified: Secondary | ICD-10-CM

## 2021-06-23 DIAGNOSIS — Z09 Encounter for follow-up examination after completed treatment for conditions other than malignant neoplasm: Secondary | ICD-10-CM | POA: Diagnosis not present

## 2021-06-23 DIAGNOSIS — E669 Obesity, unspecified: Secondary | ICD-10-CM | POA: Diagnosis not present

## 2021-06-23 DIAGNOSIS — Z87891 Personal history of nicotine dependence: Secondary | ICD-10-CM | POA: Insufficient documentation

## 2021-06-23 DIAGNOSIS — Z6841 Body Mass Index (BMI) 40.0 and over, adult: Secondary | ICD-10-CM | POA: Insufficient documentation

## 2021-06-23 DIAGNOSIS — Z7901 Long term (current) use of anticoagulants: Secondary | ICD-10-CM | POA: Diagnosis not present

## 2021-06-23 DIAGNOSIS — I252 Old myocardial infarction: Secondary | ICD-10-CM | POA: Diagnosis not present

## 2021-06-23 DIAGNOSIS — Z794 Long term (current) use of insulin: Secondary | ICD-10-CM | POA: Insufficient documentation

## 2021-06-23 LAB — BASIC METABOLIC PANEL
Anion gap: 9 (ref 5–15)
BUN: 16 mg/dL (ref 8–23)
CO2: 28 mmol/L (ref 22–32)
Calcium: 9.1 mg/dL (ref 8.9–10.3)
Chloride: 98 mmol/L (ref 98–111)
Creatinine, Ser: 0.98 mg/dL (ref 0.44–1.00)
GFR, Estimated: >60 mL/min (ref >60)
Glucose, Bld: 312 mg/dL — ABNORMAL HIGH (ref 70–99)
Potassium: 4.3 mmol/L (ref 3.5–5.1)
Sodium: 135 mmol/L (ref 135–145)

## 2021-06-23 LAB — ECHOCARDIOGRAM COMPLETE
Area-P 1/2: 2.47 cm2
Calc EF: 48.7 %
S' Lateral: 3 cm
Single Plane A2C EF: 55.7 %
Single Plane A4C EF: 50.6 %

## 2021-06-23 LAB — BRAIN NATRIURETIC PEPTIDE: B Natriuretic Peptide: 10.6 pg/mL (ref 0.0–100.0)

## 2021-06-23 MED ORDER — REPATHA SURECLICK 140 MG/ML ~~LOC~~ SOAJ
1.0000 "pen " | SUBCUTANEOUS | 11 refills | Status: DC
  Filled 2021-06-23: qty 2, 28d supply, fill #0
  Filled 2021-07-19: qty 6, 84d supply, fill #1
  Filled 2021-09-29: qty 6, 84d supply, fill #2
  Filled 2021-12-30: qty 6, 84d supply, fill #3
  Filled 2022-03-29: qty 2, 28d supply, fill #4
  Filled 2022-04-17: qty 2, 28d supply, fill #5

## 2021-06-23 NOTE — Patient Instructions (Signed)
Labs done today, your results will be available in MyChart, we will contact you for abnormal readings.  Your physician recommends that you return for a FASTING lipid profile: in 3 months  You have been referred to Pharmacy Clinic to refill your Repatha and weight loss medication, they will call you  You have been referred to the PREP program at the [facility], they will call you to schedule  Your physician recommends that you schedule a follow-up appointment in: 6 months (April 2023), **PLEASE CALL OUR OFFICE IN February TO SCHEDULE THIS APPOINTMENT  If you have any questions or concerns before your next appointment please send [place] a message through [place] or call our office at [phone].    TO LEAVE A MESSAGE FOR THE NURSE SELECT OPTION 2, PLEASE LEAVE A MESSAGE INCLUDING: YOUR NAME DATE OF BIRTH CALL BACK NUMBER REASON FOR CALL**this is important as we prioritize the call backs  YOU WILL RECEIVE A CALL BACK THE SAME DAY AS LONG AS YOU CALL BEFORE 4:00 PM  At the [facility], you and your health needs are our priority. As part of our continuing mission to provide you with exceptional heart care, we have created designated Provider Care Teams. These Care Teams include your primary Cardiologist (physician) and Advanced Practice Providers (APPs- Physician Assistants and Nurse Practitioners) who all work together to provide you with the care you need, when you need it.   You may see any of the following providers on your designated Care Team at your next follow up: Dr Glori Bickers Dr Haynes Kerns, NP Lyda Jester, [facility] [place], [place] Audry Riles, PharmD   Please be sure to bring in all your medications bottles to every appointment.

## 2021-06-23 NOTE — Telephone Encounter (Signed)
-----   Message from Scarlette Calico, RN sent at 06/23/2021 10:23 AM EDT ----- [name] this pt has been out of her Repatha for a while can you guys please refill and Dr Aundra Dubin wants her to recheck lipids in 3 months so we'll sch that here  Dr Aundra Dubin would also like her to evaluated for Semaglitide  Thanks

## 2021-06-23 NOTE — Telephone Encounter (Signed)
Repatha refill sent in.

## 2021-06-23 NOTE — Progress Notes (Signed)
PCP: Orma Render, NP  Cardiology: Dr. Irish Lack HF Cardiology: Dr. Aundra Dubin  65 y.o. with history of chronic systolic CHF, CAD, and MGUS was referred by Dr. Irish Lack for evaluation of CHF.  Patient first developed exertional dyspnea after her appendectomy in 2/19.  By 5/19, she was gaining weight and had developed a chronic cough. She never had chest pain.  Echo was done in 5/19 showing EF 25-30%, moderate MR, moderate RV dilation.  RHC/LHC was done in 6/19 showing elevated filling pressures, preserved cardiac output, and occluded mid LCx and distal RCA.  She was found to have IgM monoclonal antibody.  Skeletal survey showed no lytic lesions, likely MGUS.  Cardiac MRI in 8/19 showed EF 32%, subendocardial scar (consistent with prior MI) at inferior base, moderately dilated RV with RV EF 20%. No evidence for cardiac amyloidosis by MRI.  Repeat echo in 10/19 showed EF remaining 30-35%.  She was seen by Dr. Rayann Heman and declined ICD.   Echo in 10/20 showed EF 40-45%.   She was unable to tolerate Entresto 97/103 due to lightheadedness.   Echo was done today and reviewed, EF 50% with inferolateral hypokinesis, RV normal, IVC normal.   She returns for followup of CHF.  Weight is up 8 lbs.  She is short of breath walking around the grocery store and walking up stairs.  She is short of breath walking long distances.  No problems around the house and with ADLs.  She has been out of [place] since June.  No orthopnea/PND.  No lightheadedness, syncope, or palpitations. She is now retired.     ECG (personally reviewed): NSR, low voltage  Labs (5/19): transferrin saturation 20%, immunofixation with IgM monoclonal antibody.  Labs (6/19): K 4.5, creatinine 0.79 Labs (7/19): K 4.1, creatinine 0.86, TSH normal Labs (9/19): K 4.4, creatinine 0.61 Labs (10/19): LDL 187 Labs (12/19): LDL 82 Labs (1/20): K 4, creatinine 0.81 Labs (8/20): K 4.4, creatinine 0.82, LDL 92 Labs (11/21): LDL 88, HDl 52, TGs 208 Labs  (1/22): K 4.5, creatinine 1.02 Labs (5/22): K 4.2, creatinine 0.96  PMH: 1. Type II diabetes 2. OSA 3. H/o appendectomy 4. 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  Skeletal survey in 7/19 showed no lytic lesions.  6. 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   - Echo (5/19): EF 25-30%, mild LV dilation, moderate MR, moderately dilated RV.  - LHC/RHC (6/19): distal RCA totally occluded, mid LCx totally occluded, EF < 25%. No intervention. Mean RA 10, mean PA 42, mean PCWP 29, CI 2.3.  - Cardiac MRI in 8/19 showed EF 32%, subendocardial scar (consistent with prior MI) at inferior base, moderately dilated RV with RV EF 20%. No evidence for cardiac amyloidosis by MRI.  - Echo (10/19): EF 30-35%.   - Echo (10/20): EF 40-45%, mild LVH, mild MR - Echo (10/22): EF 50% with inferolateral hypokinesis, RV normal, IVC normal. 7. CAD: LHC (6/19) with distal RCA totally occluded, mid LCx totally occluded, EF < 25%. No intervention. 8. Hyperlipidemia: Has not tolerated statins.  9. ABIs (8/19): Normal.  10. COVID-19 infection in 9/22.   SH: Married, retired Marine scientist, quit smoking in 1991.    FH: Mother with rheumatic MV disease, s/p MVR.   ROS: All systems reviewed and negative except as per HPI.   Current Outpatient Medications  Medication Sig Dispense Refill   albuterol (PROAIR HFA) 108 (90 Base) MCG/ACT inhaler Inhale 2 puffs into the lungs every 6 (six) hours as needed.  wheezing 18 g 2   aspirin 81 MG chewable tablet TAKE 1 TABLET BY MOUTH DAILY. 90 tablet 3   bismuth subsalicylate (PEPTO BISMOL) 262 MG chewable tablet Chew 524 mg by mouth as needed for indigestion or diarrhea or loose stools.      blood glucose meter kit and supplies Use up to four times daily as directed. (FOR ICD-10 E10.9, E11.9). 1 each 99   carvedilol (COREG) 12.5 MG tablet Take 1 tablet (12.5 mg total) by mouth 2 (two) times daily. 180 tablet 3   diphenhydrAMINE HCl  (BENADRYL ALLERGY PO) Take 1 tablet by mouth as needed.     diphenoxylate-atropine (LOMOTIL) 2.5-0.025 MG tablet Take 1 tablet by mouth 4 (four) times daily as needed for diarrhea or loose stools. 30 tablet 0   Dulaglutide (TRULICITY) 3 JG/8.1LX SOPN Inject 3 mg into the skin once a week. 6 mL 3   furosemide (LASIX) 40 MG tablet TAKE 1 TABLET (40 MG TOTAL) BY MOUTH 2 TIMES DAILY. 180 tablet 3   glucose blood test strip Use as directed to test blood sugar 100 each 1   insulin glargine-yfgn (SEMGLEE) 100 UNIT/ML Pen Inject 25-40 Units into the skin daily based on blood sugar levels via sliding scale. 15 mL 11   Insulin Pen Needle 32G X 4 MM MISC USE AS DIRECTED ONCE A DAY 100 each 3   levothyroxine (SYNTHROID) 50 MCG tablet TAKE 1 TABLET BY MOUTH DAILY BEFORE BREAKFAST. 90 tablet 3   magnesium oxide (MAG-OX) 400 MG tablet Take 400 mg by mouth at bedtime as needed (for cramps).     metFORMIN (GLUCOPHAGE) 1000 MG tablet Take 1 tablet (1,000 mg total) by mouth 2 (two) times daily with a meal. 180 tablet 3   Multiple Vitamin (MULITIVITAMIN WITH MINERALS) TABS Take 1 tablet by mouth daily with breakfast.     OneTouch Delica Lancets 72I MISC use as directed 100 each 1   sacubitril-valsartan (ENTRESTO) 49-51 MG TAKE 1 TABLET BY MOUTH 2 (TWO) TIMES DAILY. 60 tablet 4   spironolactone (ALDACTONE) 25 MG tablet TAKE 1 TABLET (25 MG TOTAL) BY MOUTH DAILY. 90 tablet 3   triamcinolone ointment (KENALOG) 0.1 % Apply 1 application topically two times daily to affected area(s) as needed, sparing use to avoid whitening/thinning skin 30 g 1   Evolocumab (REPATHA SURECLICK) 203 MG/ML SOAJ Inject 1 pen into the skin every 14 (fourteen) days. 2 mL 11   No current facility-administered medications for this encounter.   BP 92/64   Pulse 97   Wt (!) 136.4 kg (300 lb 12.8 oz)   SpO2 95%   BMI 47.11 kg/m  General: NAD, obese.  Neck: No JVD, no thyromegaly or thyroid nodule.  Lungs: Clear to auscultation bilaterally  with normal respiratory effort. CV: Nondisplaced PMI.  Heart regular S1/S2, no S3/S4, no murmur.  No peripheral edema.  No carotid bruit.  Normal pedal pulses.  Abdomen: Soft, nontender, no hepatosplenomegaly, no distention.  Skin: Intact without lesions or rashes.  Neurologic: Alert and oriented x 3.  Psych: Normal affect. Extremities: No clubbing or cyanosis.  HEENT: Normal.   Assessment/Plan: 1. Chronic systolic CHF: Echo in 5/59 with EF 25-30%.  She had occluded mid LCx and distal RCA on coronary angiography.  Suspect ischemic cardiomyopathy.  She also has a monoclonal antibody.  Cardiac MRI in 8/19 showed LV EF 32% and RV EF 30%, subendocardial scar at inferior base consistent with prior MI, no evidence for cardiac amyloidosis.  Repeat echo  in 10/19 showed EF 30-35%.  Patient saw Dr. Rayann Heman and declined ICD.  However, echo in 10/20 showed EF up to 40-45% and out of ICD range. Echo today showed EF 50% with inferolateral hypokinesis, RV normal, IVC normal.  NYHA class II symptoms, not volume overloaded on exam.   - Continue Entresto 49/51 bid, unable to tolerate increase to 97/103 bid.  - Continue spironolactone 25 mg daily.   - Continue Coreg 12.5 mg bid.   - She has not wanted to take an SGLT2 inhibitor due to history of GU infections.    - Continue Lasix 40 mg bid, BMET today.  2. CAD: Occluded mLCx and dRCA.  Low EF somewhat out of proportion to this.  No interventional target.   - Continue ASA 81 daily.  - Unable to tolerate statins, taking Repatha. She has been out.  I will arrange to get the Repatha refilled, then will check her lipids in 2 months.  3. Obesity:  - I will refer to the [facility] PREP class.  - I will refer her to pharmacy clinic for semaglutide.   Lipids/BMET in 2 months.  Followup in 6 months with APP.   Terri Heath 06/23/2021

## 2021-06-24 ENCOUNTER — Telehealth: Payer: Self-pay

## 2021-06-24 NOTE — Telephone Encounter (Signed)
Called to discuss PREP program; prefers Terri Heath and can join M/W class 8-915 starting 10/31. Will complete assessment after Monday class at 9:15.

## 2021-06-27 NOTE — Progress Notes (Signed)
[facility] PREP Evaluation  Patient Details  Name: Terri Heath MRN: [id number] Date of Birth: 1955/12/12 Age: 65 y.o. PCP: Orma Render, NP  Vitals:   06/27/21 1113  BP: 128/80  Pulse: 60  SpO2: 98%  Weight: (!) 302 lb 3.2 oz (137.1 kg)     [facility] Eval - 06/27/21 1100       [facility] "PREP" Location   [facility] "PREP" Location [facility]      Referral    Referring Provider Aundra Dubin    Reason for referral Heart Failure;Diabetes;Hypertension;Inactivity;Obesitity/Overweight    Program Start Date 06/27/21      Measurement   Waist Circumference 59 inches    Hip Circumference 60.5 inches    Body fat --   E4, high     Information for Trainer   Goals --   navigate stairs,increase stamina, decrease risk/concern for fall; lose 20 more pounds be end of 12 wks; walk more   Current Exercise --   peripheral neuropathy   Pertinent Medical History --   CHF, diabetes, HTN   Current Barriers --   self   Restrictions/Precautions Fall risk    Medications that affect exercise Beta blocker      Timed Up and Go (TUGS)   Timed Up and Go Moderate risk 10-12 seconds      Mobility and Daily Activities   I find it easy to walk up or down two or more flights of stairs. 1    I have no trouble taking out the trash. 4    I do housework such as vacuuming and dusting on my own without difficulty. 4    I can easily lift a gallon of milk (8lbs). 4    I can easily walk a mile. 1    I have no trouble reaching into high cupboards or reaching down to pick up something from the floor. 4    I do not have trouble doing out-door work such as Armed forces logistics/support/administrative officer, raking leaves, or gardening. 2      Mobility and Daily Activities   I feel younger than my age. 1    I feel independent. 2    I feel energetic. 2    I live an active life.  2    I feel strong. 1    I feel healthy. 2    I feel active as other people my age. 1      How fit and strong are you.   Fit and Strong Total Score 31            Past  Medical History:  Diagnosis Date   Acute systolic heart failure (HCC)    Allergy    Asthma    Bilateral shoulder pain    Borderline hypertension    Bronchitis    CAD (coronary artery disease)    Chronic systolic CHF (congestive heart failure) (HCC)    Chronic systolic congestive heart failure, NYHA class 3 ([place]) 07/24/2018   Depression 09/15/2013   Last Assessment & Plan:  Patient has been reading self help material. Looking for higher energy individuals to spend time with and no longer aloows herself to be bullied.   Diabetes mellitus    Diagnosed in 2000   Gangrenous appendicitis with perforation s/p lap appendectomy 10/04/2017 10/04/2017   History of MI (myocardial infarction)    Hyperlipidemia    Hypertension    Hypothyroidism    MGUS (monoclonal gammopathy of unknown significance)    Mitral regurgitation  Obesity    Sleep apnea    Statin intolerance    Past Surgical History:  Procedure Laterality Date   BREATH TEK H PYLORI  09/18/2011   Procedure: BREATH TEK H PYLORI;  Surgeon: Shann Medal, MD;  Location: [facility];  Service: General;  Laterality: N/A;   CESAREAN SECTION     x 3   LAPAROSCOPIC APPENDECTOMY N/A 10/03/2017   Procedure: APPENDECTOMY LAPAROSCOPIC;  Surgeon: Leighton Ruff, MD;  Location: [facility];  Service: General;  Laterality: N/A;   RIGHT/LEFT HEART CATH AND CORONARY ANGIOGRAPHY N/A 02/12/2018   Procedure: RIGHT/LEFT HEART CATH AND CORONARY ANGIOGRAPHY;  Surgeon: Jettie Booze, MD;  Location: [facility];  Service: Cardiovascular;  Laterality: N/A;   TUBAL LIGATION     Social History   Tobacco Use  Smoking Status Former   Packs/day: 1.00   Years: 15.00   Pack years: 15.00   Types: Cigarettes   Quit date: 01/28/1990   Years since quitting: 31.4  Smokeless Tobacco Never  Started PREP today, every M/W 8-9:15 [employer]; assessment visit completed at end of class[facility] PREP Weekly Session  Patient Details  Name: Terri Heath MRN:  [id number] Date of Birth: 1956/05/29 Age: 65 y.o. PCP: Orma Render, NP  Vitals:   06/27/21 1113  BP: 128/80  Pulse: 60  SpO2: 98%  Weight: (!) 302 lb 3.2 oz (137.1 kg)     [facility] Weekly seesion - 06/27/21 1100       Weekly Session   Topic Discussed Goal setting and welcome to the program   tour of facility   Classes attended to date 1           Able to complete 10-15 minutes cardio on machine, light stretching afterward.  Shella Lahman B Yazan Gatling 06/27/2021, 11:19 AM

## 2021-06-28 NOTE — Telephone Encounter (Signed)
Received referral from Dr. Aundra Dubin to start semaglutide for this patient. She does have commercial insurance ([employer]) however weight loss medications are not covered under this plan. She does have diabetes (last A1c uncontrolled at 11.4 on 01/06/21). At PCP visit on 10/13 patient reported being out of medications for >1 month due to losing her job and switching insurance. She is already on a GLP1 agonist, dulaglutide (Trulicity) which also helps with weight loss though does not have a weight loss indication/data like semaglutide does.   I called the patient to discuss semaglutide as her plan also covers Ozempic. She states that she has resumed Trulicity after her PCP visit and hasn't been having any issues but that when she first started she had significant stomach upset. Discussed that Ozempic is in the same class of medications as Trulicity and that if she was interested we could switch to Ozempic that has stronger efficacy data in weight loss. However, she is happy on Trulicity and would like to stay on it instead of switching to Ozempic. Counseled her that once she is stabilized on Trulicity in terms of tolerability since she is resuming it after some time off, she could discuss with her PCP about increasing Trulicity to 4.5 mg which is the highest dose it now comes in for additional blood sugar lowering and weight loss benefit. She is thankful for this recommendation and will talk with her PCP at the next visit.   She started going to the PREP classes at the [facility] yesterday and stated it went well and thinks it will be good for her. We discussed lifestyle modifications that can help with weight loss and diabetes. She thanks me for my time but again states that for now she will stay on Trulicity. Of note, she is planning to switch to a Medicare plan in June 2023 and says she is aware that she won't be able to use copay cards with Medicare (she is on multiple branded meds).

## 2021-07-04 ENCOUNTER — Other Ambulatory Visit (HOSPITAL_COMMUNITY): Payer: Self-pay

## 2021-07-04 ENCOUNTER — Other Ambulatory Visit: Payer: Self-pay | Admitting: Interventional Cardiology

## 2021-07-04 MED ORDER — FUROSEMIDE 40 MG PO TABS
40.0000 mg | ORAL_TABLET | Freq: Two times a day (BID) | ORAL | 3 refills | Status: DC
  Filled 2021-07-04: qty 180, 90d supply, fill #0
  Filled 2021-09-29: qty 180, 90d supply, fill #1

## 2021-07-04 MED FILL — Aspirin Chew Tab 81 MG: ORAL | 90 days supply | Qty: 90 | Fill #2 | Status: AC

## 2021-07-04 MED FILL — Spironolactone Tab 25 MG: ORAL | 90 days supply | Qty: 90 | Fill #2 | Status: AC

## 2021-07-04 NOTE — Progress Notes (Signed)
[facility] PREP Weekly Session  Patient Details  Name: Terri Heath MRN: [id number] Date of Birth: 08/10/1956 Age: 65 y.o. PCP: Orma Render, NP  Vitals:   07/04/21 0943  Weight: (!) 302 lb (137 kg)     [facility] Weekly seesion - 07/04/21 0900       [facility] "PREP" Location   [facility]PREP[facility] Location [facility]      Weekly Session   Topic Discussed Importance of resistance training;Other ways to be active   Yuka app; cardio 150 min/wk; strength 2-3 times/wk, 20-40 minutes   Minutes exercised this week 10 minutes    Classes attended to date [place] 07/04/2021, 9:44 AM

## 2021-07-11 NOTE — Progress Notes (Signed)
[facility] PREP Weekly Session  Patient Details  Name: Terri Heath MRN: [id number] Date of Birth: [birth year] Age: 65 y.o. PCP: Orma Render, NP  Vitals:   07/11/21 0928  Weight: (!) 301 lb (136.5 kg)     [facility] Weekly seesion - 07/11/21 0900       [facility] "PREP" Location   [facility] "PREP[facility] Location [facility]      Weekly Session   Topic Discussed Healthy eating tips    Minutes exercised this week 60 minutes    Classes attended to date [place] 07/11/2021, 9:29 AM

## 2021-07-19 ENCOUNTER — Other Ambulatory Visit (HOSPITAL_COMMUNITY): Payer: Self-pay

## 2021-07-20 ENCOUNTER — Other Ambulatory Visit (HOSPITAL_COMMUNITY): Payer: Self-pay

## 2021-07-25 NOTE — Progress Notes (Signed)
Received message that she has a started a new job and will no longer be able to attend 8am PREP Class at [facility]; offered evening classes at [facility] as an option if she still wants to continue the program.

## 2021-08-08 ENCOUNTER — Other Ambulatory Visit (HOSPITAL_COMMUNITY): Payer: Self-pay

## 2021-08-08 ENCOUNTER — Other Ambulatory Visit: Payer: Self-pay | Admitting: Nurse Practitioner

## 2021-08-08 DIAGNOSIS — E039 Hypothyroidism, unspecified: Secondary | ICD-10-CM

## 2021-08-08 MED ORDER — LEVOTHYROXINE SODIUM 50 MCG PO TABS
50.0000 ug | ORAL_TABLET | Freq: Every day | ORAL | 0 refills | Status: DC
  Filled 2021-08-08: qty 90, 90d supply, fill #0

## 2021-08-08 MED FILL — Insulin Pen Needle 32 G X 4 MM (1/6" or 5/32"): 90 days supply | Qty: 100 | Fill #0 | Status: CN

## 2021-08-09 ENCOUNTER — Other Ambulatory Visit (HOSPITAL_COMMUNITY): Payer: Self-pay

## 2021-08-17 ENCOUNTER — Other Ambulatory Visit (HOSPITAL_COMMUNITY): Payer: Self-pay

## 2021-08-26 ENCOUNTER — Other Ambulatory Visit (HOSPITAL_COMMUNITY): Payer: Self-pay

## 2021-09-12 ENCOUNTER — Ambulatory Visit (HOSPITAL_BASED_OUTPATIENT_CLINIC_OR_DEPARTMENT_OTHER): Payer: 59 | Admitting: Nurse Practitioner

## 2021-09-21 ENCOUNTER — Other Ambulatory Visit (HOSPITAL_COMMUNITY): Payer: 59

## 2021-09-21 ENCOUNTER — Encounter (HOSPITAL_COMMUNITY): Payer: Self-pay

## 2021-09-29 ENCOUNTER — Other Ambulatory Visit (HOSPITAL_COMMUNITY): Payer: Self-pay | Admitting: Cardiology

## 2021-09-30 ENCOUNTER — Other Ambulatory Visit (HOSPITAL_COMMUNITY): Payer: Self-pay

## 2021-09-30 MED ORDER — ASPIRIN 81 MG PO CHEW
81.0000 mg | CHEWABLE_TABLET | Freq: Every day | ORAL | 3 refills | Status: DC
Start: 2021-09-30 — End: 2022-10-27
  Filled 2021-09-30: qty 90, 90d supply, fill #0
  Filled 2021-12-26: qty 90, 90d supply, fill #1
  Filled 2022-05-16 – 2022-09-05 (×4): qty 90, 90d supply, fill #2

## 2021-10-06 ENCOUNTER — Other Ambulatory Visit (HOSPITAL_COMMUNITY): Payer: Self-pay

## 2021-10-07 ENCOUNTER — Telehealth: Payer: 59 | Admitting: Physician Assistant

## 2021-10-07 ENCOUNTER — Other Ambulatory Visit (HOSPITAL_COMMUNITY): Payer: Self-pay

## 2021-10-07 DIAGNOSIS — N950 Postmenopausal bleeding: Secondary | ICD-10-CM

## 2021-10-07 NOTE — Progress Notes (Signed)
Based on what you shared with me, I feel your condition warrants further evaluation and I recommend that you be seen in a face to face visit with your gynecologist or at one of our [facility] clinics.  This is called postmenopausal bleeding. There are many causes for this, but the most concerning can be cancer of the uterus, vagina, or cervix. It does require further evaluation in person to determine the cause. Please seek in person care as soon as you can for further evaluation. If you feel comfortable, you can hold your aspirin until further evaluated, as this can make bleeding worse.    NOTE: There will be NO CHARGE for this eVisit   If you are having a true medical emergency please call 911.    *Center for [facility]             [address], [place], [place] [address] [phone] (*Take patients with no insurance)  *Center for [facility] at [place], [place],  [place]  [address] [phone] (*Take patients with no insurance)  Center for [facility] at [place], [place], [place], [place], [address] [phone]  Center for [facility] at [place], [place], [place], [place], [address] [phone]  Center for [facility] at [facility] [address], [place], [place], [place], [address] [phone]  Center for [facility] at [facility]                                 [place], [place], [place], [address] [phone]  Center for [facility] at [facility]                                    [address] [place], [place], [address] [place] [facility] [place] [facility] [address], [place], [place], [place], [address]                              [facility] [place], [place], [place], [place], [address] [phone]  Your MyChart E-visit questionnaire answers were reviewed by a board certified advanced clinical practitioner to complete your personal care plan based on your specific symptoms.  Thank you for using e-Visits.   I provided 5 minutes of non face-to-face time during this encounter for chart review and documentation.

## 2021-10-10 ENCOUNTER — Ambulatory Visit: Payer: Self-pay

## 2021-10-10 NOTE — Telephone Encounter (Signed)
° ° ° °  Chief Complaint: Vaginal bleeding x 8 days Symptoms: Bleeding and cramping Frequency: Started 8 days ago Pertinent Negatives: Patient denies  Disposition: [] ED /[] Urgent Care (no appt availability in office) / [] Appointment(In office/virtual)/ []  [facility]/ [] Home Care/ [] Refused Recommended Disposition /[] [facility]/ [x]  Follow-up with PCP Additional Notes: Has appointment tomorrow with PCP.  Reason for Disposition  Bleeding lasts for > 7 days  Answer Assessment - Initial Assessment Questions 1. AMOUNT: "Describe the bleeding that you are having." "How much bleeding is there?"    - SPOTTING: spotting, or pinkish / brownish mucous discharge; does not fill panty liner or pad    - MILD:  less than 1 pad / hour; less than patient's usual menstrual bleeding   - MODERATE: 1-2 pads / hour; 1 menstrual cup every 6 hours; small-medium blood clots (e.g., pea, grape, small coin)   - SEVERE: soaking 2 or more pads/hour for 2 or more hours; 1 menstrual cup every 2 hours; bleeding not contained by pads or continuous red blood from vagina; large blood clots (e.g., golf ball, large coin)      Moderate with clots 2. ONSET: "When did the bleeding begin?" "Is it continuing now?"     Feb. 2 3. MENOPAUSE: "When was your last menstrual period?"      13 years ago 4. ABDOMINAL PAIN: "Do you have any pain?" "How bad is the pain?"  (e.g., Scale 1-10; mild, moderate, or severe)   - MILD (1-3): doesn't interfere with normal activities, abdomen soft and not tender to touch    - MODERATE (4-7): interferes with normal activities or awakens from sleep, abdomen tender to touch    - SEVERE (8-10): excruciating pain, doubled over, unable to do any normal activities      Mild now 5. BLOOD THINNERS: "Do you take any blood thinners?" (e.g., Coumadin/warfarin, Pradaxa/dabigatran, aspirin)     Baby aspirin 6. HORMONES: "Are you taking any hormone medications, prescription or OTC?" (e.g., birth  control pills, estrogen)     No 7. CAUSE: "What do you think is causing the bleeding?" (e.g., recent gyn surgery, recent gyn procedure; known bleeding disorder, uterine cancer)       Unsure 8. HEMODYNAMIC STATUS: "Are you weak or feeling lightheaded?" If Yes, ask: "Can you stand and walk normally?"       Yes 9. OTHER SYMPTOMS: "What other symptoms are you having with the bleeding?" (e.g., back pain, burning with urination, fever)     No  Protocols used: Vaginal Bleeding - Postmenopausal-A-AH

## 2021-10-11 ENCOUNTER — Other Ambulatory Visit: Payer: Self-pay

## 2021-10-11 ENCOUNTER — Ambulatory Visit (INDEPENDENT_AMBULATORY_CARE_PROVIDER_SITE_OTHER): Payer: 59 | Admitting: Nurse Practitioner

## 2021-10-11 ENCOUNTER — Encounter (HOSPITAL_BASED_OUTPATIENT_CLINIC_OR_DEPARTMENT_OTHER): Payer: Self-pay | Admitting: Nurse Practitioner

## 2021-10-11 DIAGNOSIS — Z0000 Encounter for general adult medical examination without abnormal findings: Secondary | ICD-10-CM

## 2021-10-11 DIAGNOSIS — N950 Postmenopausal bleeding: Secondary | ICD-10-CM | POA: Insufficient documentation

## 2021-10-11 HISTORY — DX: Postmenopausal bleeding: N95.0

## 2021-10-11 NOTE — Progress Notes (Signed)
Virtual Visit Encounter telephone visit.   I connected with  Terri Heath on 10/11/21 at  8:10 AM EST by secure audio and/or video enabled telemedicine application. I verified that I am speaking with the correct person using two identifiers.   I introduced myself as a Designer, jewellery with the practice. The limitations of evaluation and management by telemedicine discussed with the patient and the availability of in person appointments. The patient expressed verbal understanding and consent to proceed.  Participating parties in this visit include: Myself and patient  The patient is: Patient Location: Home I am: Provider Location: Office/Clinic Subjective:    CC and HPI: Terri Heath is a 66 y.o. year old female presenting for new evaluation and treatment of vaginal bleeding. Patient reports no vaginal bleeding since approximately 2010 when she underwent menopause.  She tells me today that on 09/29/2021 she began spotting and having some mild abdominal cramping this continued until 10/06/2021 when her bleeding became significantly heavier.  She reports severe cramping and passing clots.  This lasted until 10/09/2021.  She reports when she woke on Monday morning the cramping was resolved and the bleeding is only a spotting.  The spotting does continue but she is not having any cramping, pressure, heaviness, dizziness, shortness of breath, or passing out.  She tells me she "actually feel good" right now.  She is not currently sexually active.  She did have an ED visit on Friday and was directed to go straight to OB/GYN for evaluation however she has had a very difficult time getting in with an OB/GYN in any kind of timely fashion.  She reports that while her symptoms are improved she realizes that this is significant and would like to be seen as soon as possible by gynecology for evaluation and is hoping we can help with a referral.  Past medical history, Surgical history, Family history not  pertinant except as noted below, Social history, Allergies, and medications have been entered into the medical record, reviewed, and corrections made.   Review of Systems:  All review of systems negative except what is listed in the HPI  Objective:    Alert and oriented x 4 Speaking in clear sentences with no shortness of breath. No distress.  Impression and Recommendations:    Problem List Items Addressed This Visit     Abnormal vaginal bleeding in postmenopausal patient - Primary    Unable to physically evaluate today due to virtual visit context. Patient does report that she is feeling better and not having any alarm symptoms present at this time.  Bleeding has improved and she is not experiencing any cramping or pain. I do feel it is of great importance to get her in with gynecology as soon as possible for evaluation.  We will work to see about getting a referral to someone within the next few days for urgent evaluation. Patient does have a history of MGUS. If unable to get her in with gynecology within the next day or 2 we will consider obtaining hormonal labs and pelvic/transvaginal ultrasound. Patient is agreeable to plan.  We will call her back once we have determined an available provider. Patient will follow-up if symptoms worsen or new symptoms develop.       orders and follow up as documented in EMR I discussed the assessment and treatment plan with the patient. The patient was provided an opportunity to ask questions and all were answered. The patient agreed with the plan and demonstrated an understanding  of the instructions.   The patient was advised to call back or seek an in-person evaluation if the symptoms worsen or if the condition fails to improve as anticipated.  Follow-Up: TBD  I provided 16 minutes of non-face-to-face interaction with this non face-to-face encounter including intake, same-day documentation, and chart review.   Orma Render, NP , DNP,  AGNP-c [place] [facility] [phone] (fax)

## 2021-10-11 NOTE — Assessment & Plan Note (Signed)
Unable to physically evaluate today due to virtual visit context. Patient does report that she is feeling better and not having any alarm symptoms present at this time.  Bleeding has improved and she is not experiencing any cramping or pain. I do feel it is of great importance to get her in with gynecology as soon as possible for evaluation.  We will work to see about getting a referral to someone within the next few days for urgent evaluation. Patient does have a history of MGUS. If unable to get her in with gynecology within the next day or 2 we will consider obtaining hormonal labs and pelvic/transvaginal ultrasound. Patient is agreeable to plan.  We will call her back once we have determined an available provider. Patient will follow-up if symptoms worsen or new symptoms develop.

## 2021-10-13 ENCOUNTER — Other Ambulatory Visit (HOSPITAL_COMMUNITY): Payer: Self-pay

## 2021-10-25 ENCOUNTER — Other Ambulatory Visit: Payer: Self-pay

## 2021-10-25 ENCOUNTER — Ambulatory Visit (INDEPENDENT_AMBULATORY_CARE_PROVIDER_SITE_OTHER): Payer: 59 | Admitting: Nurse Practitioner

## 2021-10-25 ENCOUNTER — Encounter (HOSPITAL_BASED_OUTPATIENT_CLINIC_OR_DEPARTMENT_OTHER): Payer: Self-pay | Admitting: Nurse Practitioner

## 2021-10-25 VITALS — BP 117/72 | HR 97 | Ht 67.0 in | Wt 300.0 lb

## 2021-10-25 DIAGNOSIS — N950 Postmenopausal bleeding: Secondary | ICD-10-CM

## 2021-10-25 DIAGNOSIS — D472 Monoclonal gammopathy: Secondary | ICD-10-CM

## 2021-10-25 NOTE — Patient Instructions (Addendum)
I am going to get a few labs today and I have ordered a pelvic and transvaginal ultrasound to be done. I sent this to [facility]. They will call you to schedule this.   I will let you know once we get the results back. I do not want to trial any hormones at this time to stop the bleeding, but we may consider this if the testing shows it is appropriate. Please let me know if anything changes while we wait for results.

## 2021-10-26 ENCOUNTER — Ambulatory Visit
Admission: RE | Admit: 2021-10-26 | Discharge: 2021-10-26 | Disposition: A | Discharge status: Home / Self Care | Payer: No Typology Code available for payment source | Source: Ambulatory Visit | Attending: Nurse Practitioner | Admitting: Nurse Practitioner

## 2021-10-26 DIAGNOSIS — N950 Postmenopausal bleeding: Secondary | ICD-10-CM

## 2021-10-26 DIAGNOSIS — D472 Monoclonal gammopathy: Secondary | ICD-10-CM

## 2021-10-26 LAB — IRON,TIBC AND FERRITIN PANEL
Ferritin: 200 ng/mL — ABNORMAL HIGH (ref 15–150)
Iron Saturation: 16 % (ref 15–55)
Iron: 54 ug/dL (ref 27–139)
Total Iron Binding Capacity: 332 ug/dL (ref 250–450)
UIBC: 278 ug/dL (ref 118–369)

## 2021-10-26 LAB — T4: T4, Total: 9.1 ug/dL (ref 4.5–12.0)

## 2021-10-26 LAB — TSH: TSH: 2.1 u[IU]/mL (ref 0.450–4.500)

## 2021-10-26 LAB — T3: T3, Total: 102 ng/dL (ref 71–180)

## 2021-10-26 LAB — CA 125: Cancer Antigen (CA) 125: 33.5 U/mL (ref 0.0–38.1)

## 2021-10-27 ENCOUNTER — Other Ambulatory Visit: Payer: Self-pay

## 2021-10-27 ENCOUNTER — Other Ambulatory Visit (HOSPITAL_COMMUNITY)
Admission: RE | Admit: 2021-10-27 | Discharge: 2021-10-27 | Disposition: A | Discharge status: Home / Self Care | Payer: 59 | Source: Ambulatory Visit | Attending: Obstetrics & Gynecology | Admitting: Obstetrics & Gynecology

## 2021-10-27 ENCOUNTER — Ambulatory Visit (INDEPENDENT_AMBULATORY_CARE_PROVIDER_SITE_OTHER): Payer: 59 | Admitting: Obstetrics & Gynecology

## 2021-10-27 ENCOUNTER — Encounter (HOSPITAL_BASED_OUTPATIENT_CLINIC_OR_DEPARTMENT_OTHER): Payer: Self-pay | Admitting: Obstetrics & Gynecology

## 2021-10-27 VITALS — BP 118/68 | HR 97 | Ht 67.0 in | Wt 300.6 lb

## 2021-10-27 DIAGNOSIS — C541 Malignant neoplasm of endometrium: Secondary | ICD-10-CM

## 2021-10-27 DIAGNOSIS — N950 Postmenopausal bleeding: Secondary | ICD-10-CM

## 2021-10-27 DIAGNOSIS — Z124 Encounter for screening for malignant neoplasm of cervix: Secondary | ICD-10-CM | POA: Insufficient documentation

## 2021-10-27 DIAGNOSIS — R935 Abnormal findings on diagnostic imaging of other abdominal regions, including retroperitoneum: Secondary | ICD-10-CM

## 2021-10-27 NOTE — Assessment & Plan Note (Signed)
Concerning finding with sudden onset vaginal bleeding of unknown etiology.  ?Unfortunately, she has been unable to get in with GYN in a timely manner to address the issue. We will begin today with pelvic and transvaginal US and labs. I will check a CA125 and ensure she is not experiencing anemia.  ?She does have a history of MGUS, which does cause an increased level of concern.  ?At this time she appears stable with no concerning vitals. She is not experiencing severe pain.  ?We will evaluate labs and determine the next best steps based on results.  ?Plan to f/u based on labs and [place].  ?

## 2021-10-27 NOTE — Progress Notes (Signed)
Reached out to patient via telephone this morning to discuss results of labs and [place].  Ferritin is elevated, likely an inflammatory reaction. Iron saturations normal, but on the lower limits- we will monitor this as the bleeding does continue.  CA125 in the normal range, but higher end. This is not definitive of cancer or no cancer, but I am concerned with the higher end of normal level.  US shows need for endometrial biopsy. I have reached out to Dr. [name] for recommendations on referral so that we can get this taken care of quickly.  All questions answered for patient. Encouraged her to call the office if there are any changes or new symptoms that present or if she has any additional questions or concerns.

## 2021-10-27 NOTE — Progress Notes (Signed)
I have called patient to inform her of the results.  ?We will need to send referral for endometrial sampling to rule out carcinoma based on the [place] results.  ?Discussed CA125 measurement being WNL, but on the higher side of normal.  ?I have reached out to Dr. [name] to get recommendations for where to send her for the endometrial biopsy and will follow-up with the patient once I hear back.  ?Patient aware of labs and imaging results and all questions answered at this time. Encouraged her to contact us if she has any further questions, concerns, or changes in her status.

## 2021-10-27 NOTE — Progress Notes (Signed)
Acute Office Visit  Subjective:    Patient ID: Terri Heath, female    DOB: 1956/02/06, 66 y.o.   MRN: [id number]  Chief Complaint  Patient presents with   Acute Visit    Patient presents today with bleeding and cramping since 09/29/2021.No other concerns than this today.     HPI Patient is in today for continuous vaginal bleeding that has been present since 09/29/2021. She did have a period of a few days in mid February when the bleeding slowed and one to two days with no bleeding present, then is started again. She reports she has been post menopausal for many years and has not had any occurrences of vaginal bleeding since her last regular menstrual period. She endorses going through about 1-3 sanitary pads a day with abdominal cramping and passing large clots. She also endorses mood swings, headaches, and low energy levels this this started. She is not on hormonal replacement. 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  She has not had any changes in bowel habits or urinary habits. She does have an appointment GYN but could not get in until the end of April and she felt this was too long to wait. She has no personal or family hx of gyn cancer or breast cancer. She is not experiencing sudden weight loss or night sweats. She is a former smoking with quitting in 1991 with no additional use. She uses alcohol socially on occasion, but not regularly.   Outpatient Medications Prior to Visit  Medication Sig Dispense Refill   albuterol (PROAIR HFA) 108 (90 Base) MCG/ACT inhaler Inhale 2 puffs into the lungs every 6 (six) hours as needed. wheezing 18 g 2   aspirin 81 MG chewable tablet Chew 1 tablet (81 mg total) by mouth daily. 90 tablet 3   bismuth subsalicylate (PEPTO BISMOL) 262 MG chewable tablet Chew 524 mg by mouth as needed for indigestion or diarrhea or loose stools.      blood glucose meter kit and supplies Use up to four times daily as directed. (FOR ICD-10 E10.9, E11.9). 1 each  99   carvedilol (COREG) 12.5 MG tablet Take 1 tablet (12.5 mg total) by mouth 2 (two) times daily. 180 tablet 3   diphenhydrAMINE HCl (BENADRYL ALLERGY PO) Take 1 tablet by mouth as needed.     diphenoxylate-atropine (LOMOTIL) 2.5-0.025 MG tablet Take 1 tablet by mouth 4 (four) times daily as needed for diarrhea or loose stools. 30 tablet 0   Dulaglutide (TRULICITY) 3 JQ/7.3AL SOPN Inject 3 mg into the skin once a week. 6 mL 3   Evolocumab (REPATHA SURECLICK) 937 MG/ML SOAJ Inject 1 pen into the skin every 14 (fourteen) days. 2 mL 11   furosemide (LASIX) 40 MG tablet Take 1 tablet (40 mg total) by mouth 2 (two) times daily. 180 tablet 3   glucose blood test strip Use as directed to test blood sugar 100 each 1   insulin glargine-yfgn (SEMGLEE) 100 UNIT/ML Pen Inject 25-40 Units into the skin daily based on blood sugar levels via sliding scale. 15 mL 11   Insulin Pen Needle 32G X 4 MM MISC USE AS DIRECTED ONCE A DAY 100 each 3   levothyroxine (SYNTHROID) 50 MCG tablet Take 1 tablet (50 mcg total) by mouth daily before breakfast. 90 tablet 0   magnesium oxide (MAG-OX) 400 MG tablet Take 400 mg by mouth at bedtime as needed (for cramps).     metFORMIN (GLUCOPHAGE) 1000 MG tablet  Take 1 tablet (1,000 mg total) by mouth 2 (two) times daily with a meal. 180 tablet 3   Multiple Vitamin (MULITIVITAMIN WITH MINERALS) TABS Take 1 tablet by mouth daily with breakfast.     OneTouch Delica Lancets 07W MISC use as directed 100 each 1   sacubitril-valsartan (ENTRESTO) 49-51 MG TAKE 1 TABLET BY MOUTH 2 (TWO) TIMES DAILY. 60 tablet 4   spironolactone (ALDACTONE) 25 MG tablet TAKE 1 TABLET (25 MG TOTAL) BY MOUTH DAILY. 90 tablet 3   triamcinolone ointment (KENALOG) 0.1 % Apply 1 application topically two times daily to affected area(s) as needed, sparing use to avoid whitening/thinning skin 30 g 1   No facility-administered medications prior to visit.    Allergies  Allergen Reactions   Amoxicillin Other (See  Comments)    Dizziness, abdominal pain   Adhesive [Tape] Rash   Exenatide Other (See Comments)    Flu-like symptoms   Invokana [Canagliflozin] Other (See Comments)    Yeast infection/dehydration   Jardiance [Empagliflozin] Other (See Comments)    Yeast infections and dehydration   Lisinopril Other (See Comments)    cramping   Other Diarrhea and Other (See Comments)    Kale=food  Per patient, it causes GI bleeding    Statins Other (See Comments)    Leg cramps    Review of Systems All review of systems negative except what is listed in the HPI     Objective:    Physical Exam Vitals and nursing note reviewed.  Constitutional:      Appearance: Normal appearance. She is obese.  HENT:     Head: Normocephalic.  Eyes:     Extraocular Movements: Extraocular movements intact.     Conjunctiva/sclera: Conjunctivae normal.     Pupils: Pupils are equal, round, and reactive to light.  Neck:     Vascular: No carotid bruit.  Cardiovascular:     Rate and Rhythm: Normal rate and regular rhythm.     Pulses: Normal pulses.     Heart sounds: Normal heart sounds. No murmur heard. Pulmonary:     Effort: Pulmonary effort is normal.     Breath sounds: Normal breath sounds.  Abdominal:     General: Bowel sounds are normal. There is no distension.     Palpations: Abdomen is soft. There is no mass.     Tenderness: There is abdominal tenderness. There is no right CVA tenderness, left CVA tenderness, guarding or rebound.  Musculoskeletal:     Right lower leg: No edema.     Left lower leg: No edema.  Lymphadenopathy:     Cervical: No cervical adenopathy.  Skin:    General: Skin is warm and dry.     Capillary Refill: Capillary refill takes less than 2 seconds.  Neurological:     General: No focal deficit present.     Mental Status: She is alert and oriented to person, place, and time.  Psychiatric:        Mood and Affect: Mood normal.        Behavior: Behavior normal.        Thought  Content: Thought content normal.        Judgment: Judgment normal.    BP 117/72    Pulse 97    Ht 5' 7"  (1.702 m)    Wt 300 lb (136.1 kg)    SpO2 97%    BMI 46.99 kg/m  Wt Readings from Last 3 Encounters:  10/25/21 300 lb (136.1 kg)  07/11/21 Marland Kitchen)  301 lb (136.5 kg)  07/04/21 (!) 302 lb (137 kg)       Assessment & Plan:   Problem List Items Addressed This Visit     Abnormal vaginal bleeding in postmenopausal patient - Primary    Concerning finding with sudden onset vaginal bleeding of unknown etiology.  Unfortunately, she has been unable to get in with GYN in a timely manner to address the issue. We will begin today with pelvic and transvaginal US and labs. I will check a CA125 and ensure she is not experiencing anemia.  She does have a history of MGUS, which does cause an increased level of concern.  At this time she appears stable with no concerning vitals. She is not experiencing severe pain.  We will evaluate labs and determine the next best steps based on results.  Plan to f/u based on labs and [place].       Relevant Orders   CBC with Differential   TSH (Completed)   T4 (Completed)   T3 (Completed)   Iron, TIBC and Ferritin Panel (Completed)   US PELVIC COMPLETE WITH TRANSVAGINAL (Completed)   CA 125 (Completed)   Other Visit Diagnoses     Monoclonal gammopathy of unknown significance (MGUS)       Relevant Orders   CBC with Differential   TSH (Completed)   T4 (Completed)   T3 (Completed)   Iron, TIBC and Ferritin Panel (Completed)   US PELVIC COMPLETE WITH TRANSVAGINAL (Completed)   CA 125 (Completed)        No orders of the defined types were placed in this encounter.    Orma Render, NP

## 2021-10-28 ENCOUNTER — Encounter (HOSPITAL_BASED_OUTPATIENT_CLINIC_OR_DEPARTMENT_OTHER): Payer: Self-pay | Admitting: Obstetrics & Gynecology

## 2021-10-28 NOTE — Progress Notes (Signed)
GYNECOLOGY  VISIT ? ?CC:   PMP bleeding ? ?HPI: ?66 y.o. G3P3 Married White or Caucasian female referred from Jacolyn Reedy, NP, for additional evaluation of her PMP bleeding.  This has jsut started in the last month.  Bleeding has been bright red and like a cycle at times.  Denies pain.  Only spotting this week.  Saw Clarise Cruz initially on 2/14.  Ca 125 was 33.5.  Ultrasound ordered showing uterus 8 x 4 x 5cm with 1.6cm thickened and internally vascular endometrium.  Ovaries not well seen. ? ?She is her for additional evaluation.  Discussed with pt additional testing that is needed as well as possible outcomes and possible surgery.  Pt just happy to get evaluation done today. ? ?No HRT use. ? ?Patient Active Problem List  ? Diagnosis Date Noted  ? Abnormal vaginal bleeding in postmenopausal patient 10/11/2021  ? Encounter to establish care 01/06/2021  ? Vitamin D deficiency 01/06/2021  ? Rash of back 01/06/2021  ? Claudication in peripheral vascular disease ([place]) 09/16/2020  ? Chronic systolic CHF (congestive heart failure), NYHA class 3 ([place]) 07/24/2018  ? Acquired hypothyroidism 07/24/2018  ? Cardiomyopathy, ischemic 07/24/2018  ? MGUS (monoclonal gammopathy of unknown significance) 03/06/2018  ? Dyspnea 01/14/2018  ? Allergic rhinitis 06/11/2014  ? Asthma due to seasonal allergies 10/24/2012  ? OSA (obstructive sleep apnea) 05/23/2011  ? Poorly controlled type 2 diabetes mellitus with circulatory disorder ([place]) 03/26/2007  ? Hyperlipidemia 03/26/2007  ? Essential hypertension 03/26/2007  ? ? ?Past Medical History:  ?Diagnosis Date  ? Acute systolic heart failure ([place])   ? Allergy   ? Asthma   ? Bilateral shoulder pain   ? Borderline hypertension   ? Bronchitis   ? CAD (coronary artery disease)   ? Chronic systolic CHF (congestive heart failure) ([place])   ? Chronic systolic congestive heart failure, NYHA class 3 ([place]) 07/24/2018  ? Depression 09/15/2013  ? Last Assessment & Plan:  Patient has been reading self help  material. Looking for higher energy individuals to spend time with and no longer aloows herself to be bullied.  ? Diabetes mellitus   ? Diagnosed in 2000  ? Gangrenous appendicitis with perforation s/p lap appendectomy 10/04/2017 10/04/2017  ? History of MI (myocardial infarction)   ? Hyperlipidemia   ? Hypertension   ? Hypothyroidism   ? MGUS (monoclonal gammopathy of unknown significance)   ? Mitral regurgitation   ? Obesity   ? Sleep apnea   ? Statin intolerance   ? ? ?Past Surgical History:  ?Procedure Laterality Date  ? BREATH TEK H PYLORI  09/18/2011  ? Procedure: BREATH TEK H PYLORI;  Surgeon: Shann Medal, MD;  Location: [facility];  Service: General;  Laterality: N/A;  ? CESAREAN SECTION    ? x 3  ? LAPAROSCOPIC APPENDECTOMY N/A 10/03/2017  ? Procedure: APPENDECTOMY LAPAROSCOPIC;  Surgeon: Leighton Ruff, MD;  Location: [facility];  Service: General;  Laterality: N/A;  ? RIGHT/LEFT HEART CATH AND CORONARY ANGIOGRAPHY N/A 02/12/2018  ? Procedure: RIGHT/LEFT HEART CATH AND CORONARY ANGIOGRAPHY;  Surgeon: Jettie Booze, MD;  Location: [facility];  Service: Cardiovascular;  Laterality: N/A;  ? TUBAL LIGATION    ? ? ?MEDS:   ?Current Outpatient Medications on File Prior to Visit  ?Medication Sig Dispense Refill  ? albuterol (PROAIR HFA) 108 (90 Base) MCG/ACT inhaler Inhale 2 puffs into the lungs every 6 (six) hours as needed. wheezing 18 g 2  ? aspirin 81 MG chewable  tablet Chew 1 tablet (81 mg total) by mouth daily. 90 tablet 3  ? bismuth subsalicylate (PEPTO BISMOL) 262 MG chewable tablet Chew 524 mg by mouth as needed for indigestion or diarrhea or loose stools.     ? blood glucose meter kit and supplies Use up to four times daily as directed. (FOR ICD-10 E10.9, E11.9). 1 each 99  ? carvedilol (COREG) 12.5 MG tablet Take 1 tablet (12.5 mg total) by mouth 2 (two) times daily. 180 tablet 3  ? diphenhydrAMINE HCl (BENADRYL ALLERGY PO) Take 1 tablet by mouth as needed.    ? diphenoxylate-atropine (LOMOTIL)  2.5-0.025 MG tablet Take 1 tablet by mouth 4 (four) times daily as needed for diarrhea or loose stools. 30 tablet 0  ? Dulaglutide (TRULICITY) 3 SJ/6.2EZ SOPN Inject 3 mg into the skin once a week. 6 mL 3  ? Evolocumab (REPATHA SURECLICK) 662 MG/ML SOAJ Inject 1 pen into the skin every 14 (fourteen) days. 2 mL 11  ? furosemide (LASIX) 40 MG tablet Take 1 tablet (40 mg total) by mouth 2 (two) times daily. 180 tablet 3  ? glucose blood test strip Use as directed to test blood sugar 100 each 1  ? insulin glargine-yfgn (SEMGLEE) 100 UNIT/ML Pen Inject 25-40 Units into the skin daily based on blood sugar levels via sliding scale. 15 mL 11  ? Insulin Pen Needle 32G X 4 MM MISC USE AS DIRECTED ONCE A DAY 100 each 3  ? levothyroxine (SYNTHROID) 50 MCG tablet Take 1 tablet (50 mcg total) by mouth daily before breakfast. 90 tablet 0  ? magnesium oxide (MAG-OX) 400 MG tablet Take 400 mg by mouth at bedtime as needed (for cramps).    ? metFORMIN (GLUCOPHAGE) 1000 MG tablet Take 1 tablet (1,000 mg total) by mouth 2 (two) times daily with a meal. 180 tablet 3  ? Multiple Vitamin (MULITIVITAMIN WITH MINERALS) TABS Take 1 tablet by mouth daily with breakfast.    ? OneTouch Delica Lancets 94T MISC use as directed 100 each 1  ? sacubitril-valsartan (ENTRESTO) 49-51 MG TAKE 1 TABLET BY MOUTH 2 (TWO) TIMES DAILY. 60 tablet 4  ? triamcinolone ointment (KENALOG) 0.1 % Apply 1 application topically two times daily to affected area(s) as needed, sparing use to avoid whitening/thinning skin 30 g 1  ? spironolactone (ALDACTONE) 25 MG tablet TAKE 1 TABLET (25 MG TOTAL) BY MOUTH DAILY. 90 tablet 3  ? [DISCONTINUED] insulin detemir (LEVEMIR) 100 UNIT/ML FlexPen Inject 10 Units into the skin daily. 15 mL 11  ? ?No current facility-administered medications on file prior to visit.  ? ? ?ALLERGIES: Amoxicillin, Adhesive [tape], Exenatide, Invokana [canagliflozin], Jardiance [empagliflozin], Lisinopril, Other, and Statins ? ?Family History  ?Problem  Relation Age of Onset  ? Alcohol abuse Mother   ? Arthritis Mother   ? Diabetes Mother   ? Sleep apnea Mother   ? Anxiety disorder Mother   ? Eating disorder Mother   ? Obesity Mother   ? Alcohol abuse Father   ? Heart disease Father   ? Hypertension Father   ? Stroke Father   ? Cancer Father   ? Sleep apnea Sister   ? Breast cancer Neg Hx   ? ? ?SH:  married, non smoker ? ?Review of Systems  ?Genitourinary:   ?     Pmp bleeding  ? ?PHYSICAL EXAMINATION:   ? ?BP 118/68 (BP Location: Left Arm, Patient Position: Sitting, Cuff Size: Large)   Pulse 97   Ht _0  (  1.702 m) Comment: reported  Wt (!) 300 lb 9.6 oz (136.4 kg)   BMI 47.08 kg/m?     ?General appearance: alert, cooperative and appears stated age ?Lymph:  no inguinal LAD noted ? ?Pelvic: External genitalia:  no lesions ?             Urethra:  normal appearing urethra with no masses, tenderness or lesions ?             Bartholins and Skenes: normal    ?             Vagina: normal appearing vagina with normal color and discharge, no lesions ?             Cervix: no lesions ?             Bimanual Exam:  Uterus:  normal size, contour, position, consistency, mobility, non-tender ?             Adnexa: no mass, fullness, tenderness ? ?Endometrial biopsy recommended.  Discussed with patient.  Verbal and written consent obtained.   ?Procedure:  Speculum placed.  Cervix visualized and cleansed with betadine prep.  A single toothed tenaculum was applied to the anterior lip of the cervix.  Endometrial pipelle was advanced through the cervix into the endometrial cavity without difficulty.  Pipelle passed to8cm.  Suction applied and pipelle removed with good tissue sample obtained.  Tenculum removed.  No bleeding noted.  Patient tolerated procedure well. ? ?Chaperone, Octaviano Batty, CMA, was present for exam. ? ?Assessment/Plan: ?1. Postmenopausal bleeding ?- Cytology - PAP( [place]) ?- Surgical pathology( [place]/ [facility]) ? ?2. Abnormal ultrasound of  endometrium ? ?3. Cervical cancer screening ?- Cytology - PAP( [place])  ? ? ?

## 2021-10-31 ENCOUNTER — Other Ambulatory Visit: Payer: Self-pay | Admitting: Family Medicine

## 2021-10-31 ENCOUNTER — Other Ambulatory Visit (HOSPITAL_COMMUNITY): Payer: Self-pay | Admitting: Nurse Practitioner

## 2021-10-31 ENCOUNTER — Other Ambulatory Visit: Payer: Self-pay | Admitting: Nurse Practitioner

## 2021-10-31 ENCOUNTER — Other Ambulatory Visit (HOSPITAL_COMMUNITY): Payer: Self-pay | Admitting: Cardiology

## 2021-10-31 ENCOUNTER — Other Ambulatory Visit (HOSPITAL_COMMUNITY): Payer: Self-pay | Admitting: Family Medicine

## 2021-10-31 ENCOUNTER — Other Ambulatory Visit (HOSPITAL_COMMUNITY): Payer: Self-pay

## 2021-10-31 DIAGNOSIS — E039 Hypothyroidism, unspecified: Secondary | ICD-10-CM

## 2021-10-31 DIAGNOSIS — E1165 Type 2 diabetes mellitus with hyperglycemia: Secondary | ICD-10-CM

## 2021-10-31 DIAGNOSIS — E1159 Type 2 diabetes mellitus with other circulatory complications: Secondary | ICD-10-CM

## 2021-11-01 ENCOUNTER — Encounter (HOSPITAL_BASED_OUTPATIENT_CLINIC_OR_DEPARTMENT_OTHER): Payer: Self-pay | Admitting: Nurse Practitioner

## 2021-11-01 ENCOUNTER — Other Ambulatory Visit (HOSPITAL_COMMUNITY): Payer: Self-pay

## 2021-11-01 MED ORDER — SPIRONOLACTONE 25 MG PO TABS
25.0000 mg | ORAL_TABLET | Freq: Every day | ORAL | 3 refills | Status: DC
  Filled 2021-11-01: qty 90, 90d supply, fill #0

## 2021-11-02 ENCOUNTER — Other Ambulatory Visit: Payer: Self-pay | Admitting: Nurse Practitioner

## 2021-11-02 ENCOUNTER — Other Ambulatory Visit (HOSPITAL_COMMUNITY): Payer: Self-pay

## 2021-11-02 DIAGNOSIS — E1165 Type 2 diabetes mellitus with hyperglycemia: Secondary | ICD-10-CM

## 2021-11-02 DIAGNOSIS — E1159 Type 2 diabetes mellitus with other circulatory complications: Secondary | ICD-10-CM

## 2021-11-02 DIAGNOSIS — E039 Hypothyroidism, unspecified: Secondary | ICD-10-CM

## 2021-11-02 MED ORDER — LEVOTHYROXINE SODIUM 50 MCG PO TABS
50.0000 ug | ORAL_TABLET | Freq: Every day | ORAL | 0 refills | Status: DC
  Filled 2021-11-02: qty 90, 90d supply, fill #0

## 2021-11-02 MED ORDER — UNIFINE PENTIPS 32G X 4 MM MISC
3 refills | Status: DC
  Filled 2021-11-02: qty 100, 90d supply, fill #0
  Filled 2022-09-08: qty 100, 100d supply, fill #0

## 2021-11-03 ENCOUNTER — Other Ambulatory Visit (HOSPITAL_COMMUNITY): Payer: Self-pay

## 2021-11-03 LAB — CYTOLOGY - PAP

## 2021-11-04 ENCOUNTER — Encounter: Payer: Self-pay | Admitting: Gynecologic Oncology

## 2021-11-07 ENCOUNTER — Encounter: Payer: Self-pay | Admitting: Gynecologic Oncology

## 2021-11-07 ENCOUNTER — Inpatient Hospital Stay: Payer: 59 | Attending: Gynecologic Oncology | Admitting: Gynecologic Oncology

## 2021-11-07 ENCOUNTER — Inpatient Hospital Stay: Payer: 59

## 2021-11-07 ENCOUNTER — Other Ambulatory Visit: Payer: Self-pay

## 2021-11-07 ENCOUNTER — Inpatient Hospital Stay (HOSPITAL_BASED_OUTPATIENT_CLINIC_OR_DEPARTMENT_OTHER): Payer: 59 | Admitting: Gynecologic Oncology

## 2021-11-07 VITALS — BP 119/60 | HR 94 | Temp 98.2°F | Resp 18 | Ht 66.54 in | Wt 293.0 lb

## 2021-11-07 DIAGNOSIS — Z780 Asymptomatic menopausal state: Secondary | ICD-10-CM | POA: Insufficient documentation

## 2021-11-07 DIAGNOSIS — Z7982 Long term (current) use of aspirin: Secondary | ICD-10-CM | POA: Diagnosis not present

## 2021-11-07 DIAGNOSIS — E1159 Type 2 diabetes mellitus with other circulatory complications: Secondary | ICD-10-CM

## 2021-11-07 DIAGNOSIS — I5022 Chronic systolic (congestive) heart failure: Secondary | ICD-10-CM

## 2021-11-07 DIAGNOSIS — F32A Depression, unspecified: Secondary | ICD-10-CM | POA: Insufficient documentation

## 2021-11-07 DIAGNOSIS — C569 Malignant neoplasm of unspecified ovary: Secondary | ICD-10-CM

## 2021-11-07 DIAGNOSIS — C541 Malignant neoplasm of endometrium: Secondary | ICD-10-CM

## 2021-11-07 DIAGNOSIS — Z7984 Long term (current) use of oral hypoglycemic drugs: Secondary | ICD-10-CM | POA: Insufficient documentation

## 2021-11-07 DIAGNOSIS — Z6841 Body Mass Index (BMI) 40.0 and over, adult: Secondary | ICD-10-CM | POA: Diagnosis not present

## 2021-11-07 DIAGNOSIS — M545 Low back pain, unspecified: Secondary | ICD-10-CM | POA: Diagnosis not present

## 2021-11-07 DIAGNOSIS — Z79899 Other long term (current) drug therapy: Secondary | ICD-10-CM | POA: Diagnosis not present

## 2021-11-07 DIAGNOSIS — I252 Old myocardial infarction: Secondary | ICD-10-CM | POA: Diagnosis not present

## 2021-11-07 DIAGNOSIS — G4733 Obstructive sleep apnea (adult) (pediatric): Secondary | ICD-10-CM | POA: Diagnosis not present

## 2021-11-07 DIAGNOSIS — I2510 Atherosclerotic heart disease of native coronary artery without angina pectoris: Secondary | ICD-10-CM | POA: Insufficient documentation

## 2021-11-07 DIAGNOSIS — I340 Nonrheumatic mitral (valve) insufficiency: Secondary | ICD-10-CM | POA: Diagnosis not present

## 2021-11-07 DIAGNOSIS — D472 Monoclonal gammopathy: Secondary | ICD-10-CM | POA: Insufficient documentation

## 2021-11-07 DIAGNOSIS — Z7985 Long-term (current) use of injectable non-insulin antidiabetic drugs: Secondary | ICD-10-CM | POA: Insufficient documentation

## 2021-11-07 DIAGNOSIS — E1165 Type 2 diabetes mellitus with hyperglycemia: Secondary | ICD-10-CM

## 2021-11-07 DIAGNOSIS — Z794 Long term (current) use of insulin: Secondary | ICD-10-CM | POA: Diagnosis not present

## 2021-11-07 DIAGNOSIS — Z8542 Personal history of malignant neoplasm of other parts of uterus: Secondary | ICD-10-CM | POA: Insufficient documentation

## 2021-11-07 NOTE — Patient Instructions (Addendum)
Preparing for your Surgery ? ?Plan for surgery on November 15, 2021 with Dr. Jeral Pinch at [facility] will be scheduled for dilation and curettage of the uterus (scraping/sampling from the lining of the uterus), possible hysteroscopy/myosure (using camera), Mirena intrauterine device placement.  ? ?We will need to obtain clearance from your cardiologist and medical provider. ? ?Plan to also have an MRI before surgery and Dr. Berline Lopes will notify you of the results.  ? ?Pre-operative Testing ?-You will receive a phone call from presurgical testing at [facility] to arrange for a pre-op appointment at the hospital. ? ?-Bring your insurance card, copy of an advanced directive if applicable, medication list. ? ?-You can continue taking your aspirin 81 mg with the last dose being the day before surgery. ? ?-Do not take supplements such as fish oil (omega 3), red yeast rice, turmeric before your surgery. You want to avoid medications with aspirin in them including headache powders such as BC or Goody's), Excedrin migraine. ? ?Day Before Surgery at Home ?-You will be advised you can have clear liquids up until 3 hours before your surgery.   ? ?Your role in recovery ?Your role is to become active as soon as directed by your doctor, while still giving yourself time to heal.  Rest when you feel tired. You will be asked to do the following in order to speed your recovery: ? ?- Cough and breathe deeply. This helps to clear and expand your lungs and can prevent pneumonia after surgery.  ?- STAY ACTIVE WHEN YOU GET HOME. Do mild physical activity. Walking or moving your legs help your circulation and body functions return to normal. Do not try to get up or walk alone the first time after surgery.   ?-If you develop swelling on one leg or the other, pain in the back of your leg, redness/warmth in one of your legs, please call the office or go to the Emergency Room to have a doppler to rule out a blood  clot. For shortness of breath, chest pain-seek care in the Emergency Room as soon as possible. ?- Actively manage your pain. Managing your pain lets you move in comfort. We will ask you to rate your pain on a scale of zero to 10. It is your responsibility to tell your doctor or nurse where and how much you hurt so your pain can be treated. ? ?Special Considerations ?-Your final pathology results from surgery should be available around one week after surgery and the results will be relayed to you when available. ? ?-FMLA forms can be faxed to [phone] and please allow 5-7 business days for completion. ? ?Pain Management After Surgery ?-Make sure that you have Tylenol and Ibuprofen if you are able to take these medications at home to use on a regular basis after surgery for pain control. We recommend alternating the medications every hour to six hours since they work differently and are processed in the body differently for pain relief. ? ?-Review the attached handout on narcotic use and their risks and side effects.  ? ?Bowel Regimen ?-It is important to prevent constipation and drink adequate amounts of liquids.  ? ?Risks of Surgery ?Risks of surgery are low but include bleeding, infection, damage to surrounding structures, re-operation, blood clots, and very rarely death. ? ?AFTER SURGERY INSTRUCTIONS ? ?Return to work:  1-2 days if applicable ? ?Activity: ?1. Be up and out of the bed during the day.  Take a nap if  needed.  You may walk up steps but be careful and use the hand rail.  Stair climbing will tire you more than you think, you may need to stop part way and rest.  ? ?2. No lifting or straining for 1 week over 10 pounds. No pushing, pulling, straining for 1 week. ? ?3. No driving for minimum 24 hours after surgery if you were able to drive before.  Do not drive if you are taking narcotic pain medicine and make sure that your reaction time has returned.  ? ?4. You can shower as soon as the next day after  surgery. Shower daily. No tub baths or submerging your body in water until cleared by your surgeon. If you have the soap that was given to you by pre-surgical testing that was used before surgery, you do not need to use it afterwards because this can irritate your incisions.  ? ?5. No sexual activity and nothing in the vagina for 4 weeks. ? ?6. You may experience vaginal spotting and discharge after surgery.  The spotting is normal but if you experience heavy bleeding, call our office. ? ?7. Take Tylenol or ibuprofen for pain. Monitor your Tylenol intake to a max of 4,000 mg in a 24 hour period.  ? ?Diet: ?1. Low sodium Heart Healthy Diet is recommended but you are cleared to resume your normal (before surgery) diet after your procedure. ? ?2. It is safe to use a laxative, such as Miralax or Colace, if you have difficulty moving your bowels.  ? ?Reasons to call the Doctor: ?Fever - Oral temperature greater than 100.4 degrees Fahrenheit ?Foul-smelling vaginal discharge ?Difficulty urinating ?Nausea and vomiting ?Difficulty breathing with or without chest pain ?New calf pain especially if only on one side ?Sudden, continuing increased vaginal bleeding with or without clots. ?  ?Contacts: ?For questions or concerns you should contact: ? ?Dr. Jeral Pinch at [phone] ? ?Joylene John, NP at [phone] ? ?After Hours: call [phone] and have the GYN Oncologist paged/contacted (after 5 pm or on the weekends). ? ?Messages sent via mychart are for non-urgent matters and are not responded to after hours so for urgent needs, please call the after hours number. ? ? ? ?  ?

## 2021-11-07 NOTE — Progress Notes (Unsigned)
GYNECOLOGIC ONCOLOGY NEW PATIENT CONSULTATION  ° °Patient Name: Terri Heath  °Patient Age: 66 y.o. °Date of Service: 11/07/21 °Referring Provider: Dr. 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  ° °Discussed medical comorbidities including her obesity, poorly controlled diabetes, and cardiac disease.  Discussed perioperative risk related to poorly controlled diabetes as well as postoperative morbidity including impaired wound healing and risk of infection. °Plan for MRI to evaluate for myometrial invasion as well as pelvic metastatic disease. °Discussed plan for progesterone therapy while the patient works to improve glycemic control.  Discussed oral and IUD option, plan for D&C with Mirena IUD.  We will get the MRI first and call the patient if findings change plan for hormonal therapy. °Given cardiac history, will get clearance for her D&C.  She will also need additional clearance if and when we are ready to proceed with robotic surgery. ° ° °We reviewed the nature of endometrial cancer and its recommended surgical staging, including total hysterectomy, bilateral salpingo-oophorectomy, and lymph node assessment. ***The patient is a suitable candidate for staging via a minimally invasive approach to surgery.  We reviewed that robotic assistance would be used to complete the surgery.  ° ° ***We discussed that most endometrial cancer is detected early, however, we reviewed that adjuvant therapy will likely be recommended based on the patient's biopsy, however, we will defer to final pathology results.   °***We discussed that most endometrial cancer is detected early and that decisions regarding adjuvant therapy will be made based on her final pathology.  °***Given her high risk histology, we recommend CT scan preoperatively to rule out metastatic disease. ° °We reviewed  the sentinel lymph node technique. Risks and benefits of sentinel lymph node biopsy was reviewed. We reviewed the technique and ICG dye. ***The patient DOES NOT have an iodine allergy or known liver dysfunction. We reviewed the false negative rate (0.4%), and that 3% of patients with metastatic disease will not have it detected by SLN biopsy in endometrial cancer. A low risk of allergic reaction to the dye, <0.2% for ICG, has been reported. We also discussed that in the case of failed mapping, which occurs 40% of the time, a bilateral or unilateral lymphadenectomy will be performed at the surgeon’s discretion.  ° °Potential benefits of sentinel nodes including a higher detection rate for metastasis due to ultrastaging and potential reduction in operative morbidity. However, there remains uncertainty as to the role for treatment of micrometastatic disease. Further, the benefit of operative morbidity associated with the SLN technique in endometrial cancer is not yet completely known. In other patient populations (e.g. the cervical cancer population) there has been observed reductions in morbidity with SLN biopsy compared to pelvic lymphadenectomy. Lymphedema, nerve dysfunction and lymphocysts are all potential risks with the SLN technique as with complete lymphadenectomy. Additional risks to the patient include the risk of damage to an internal organ while operating in an altered view (e.g. the black and white image of the robotic fluorescence imaging mode).  ° °The patient was consented for a robotic assisted hysterectomy, bilateral salpingo-oophorectomy, sentinel lymph node evaluation, possible lymph node dissection, possible laparotomy. The risks of surgery were discussed in detail and she understands these to include infection; wound separation; hernia; vaginal cuff separation, injury to adjacent organs such as bowel, bladder, blood vessels, ureters and nerves; bleeding which may require blood transfusion;  anesthesia risk; thromboembolic events;   possible death; unforeseen complications; possible need for re-exploration; medical complications such as heart attack, stroke, pleural effusion and pneumonia; and, if full lymphadenectomy is performed the risk of lymphedema and lymphocyst. The patient will receive DVT and antibiotic prophylaxis as indicated. She voiced a clear understanding. She had the opportunity to ask questions. Perioperative instructions were reviewed with her. Prescriptions for post-op medications were sent to her pharmacy of choice. ° ° °A copy of this note was sent to the patient's referring provider.  ° °*** minutes of total time was spent for this patient encounter, including preparation, face-to-face counseling with the patient and coordination of care, and documentation of the encounter. ° °Katherine Tucker, MD  °Division of Gynecologic Oncology  °Department of Obstetrics and Gynecology  °[facility]  °___________________________________________  °Chief Complaint: °Chief Complaint  °Patient presents with  ° Endometrioid adenocarcinoma of ovary, unspecified lateralit  ° ° °History of Present Illness:  °Terri Heath is a 66 y.o. y.o. female who is seen in consultation at the request of Dr. Miller for an evaluation of endometrial cancer. ° °Patient endorses menopause at the age of 52.  She denies any postmenopausal bleeding until February 2.  Since that time, she endorses intermittent bleeding.  She will have days with no bleeding at all and other days where she is changing a thin pad every 3 hours (she describes the pad is being about half saturated).  She has some associated low back pain and pelvic cramping with her bleeding.  She notes intermittent passage of clots that are anywhere from dime to egg yolk size.  She also endorses some fatigue and emotional mood swings. ° °She was seen initially on 10/25/2021.  CA-125 at that time was 33.5.  She underwent endometrial  biopsy on 3/2 showing endometrioid adenocarcinoma with focal secretory features, FIGO grade 1/2.  Pelvic ultrasound on 10/26/2021 showed the following: °1. Thickened and heterogeneous endometrial complex measuring up to 16 mm with scattered areas of internal vascularity. In the setting of post-menopausal bleeding, endometrial sampling is indicated to exclude carcinoma. If results are benign, sonohysterogram should be considered for focal lesion work-up. (Ref: Radiological Reasoning: °Algorithmic Workup of Abnormal Vaginal Bleeding with Endovaginal Sonography and Sonohysterography. AJR 2008; 191:S68-73) °2. Two prominent nabothian cysts measuring up to 1.7 cm as above. Otherwise normal sonographic appearance of the uterus. °3. Nonvisualization of either ovary.  No adnexal mass or free fluid. ° °Since receiving her biopsy results, she endorses decreased appetite.  She denies any nausea or emesis.  She reports regular daily bowel function, uses Metamucil as needed.  Has mild odor to her urine, unsure if this is related to blood or her diabetes. ° °With regard to her diabetes, the patient is currently on metformin, Trulicity, and insulin.  It is managed by her primary care provider.  She was seeing an endocrinologist but was fired as a patient about 2 years ago.  Within our system, I see that she saw internal medicine in 2021 with regard to her diabetes.  Since that time, it has been managed by her PCP.  Her last hemoglobin A1c was in 12/2020 and was 11.4%.  Her glucose on BMP in 05/2021 was 312.  Patient does not check her sugars at home.  She has multiple meters but does not feel like checking her glucose helps her improve glycemic control.  Number of years ago, she was able to achieve between 50 and 100 pounds of weight loss by increasing exercise and using a   keto diet.  She has not gained any of the weight back that she was able to lose.  Her medical history is also notable for hypertension, heart failure.  She  follows with cardiology.  She had an echo at the end of 2022 that showed a ejection fraction of 50% with inferior lateral hypokinesis.  She will follow-up in April.  She also has obstructive sleep apnea, uses a CPAP at home.  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  After her appendiceal surgery, she had significant challenges from a healing standpoint.  PAST MEDICAL HISTORY:  Past Medical History:  Diagnosis Date   Acute systolic heart failure (HCC)    Allergy    Asthma    Bilateral shoulder pain    Borderline hypertension    Bronchitis    CAD (coronary artery disease)    Chronic systolic CHF (congestive heart failure) (HCC)    Chronic systolic congestive heart failure, NYHA class 3 ([place]) 07/24/2018   Depression 09/15/2013   Last Assessment & Plan:  Patient has been reading self help material. Looking for higher energy individuals to spend time with and no longer aloows herself to be bullied.   Diabetes mellitus    Diagnosed in 2000, on insulin, Trulicity and metformin, not checking cgs at home   Gangrenous appendicitis with perforation s/p lap appendectomy 10/04/2017 10/04/2017   History of MI (myocardial infarction)    Hyperlipidemia    Hypertension    Hypothyroidism    MGUS (monoclonal gammopathy of unknown significance)    Mitral regurgitation    Neuropathy    Obesity    Sleep apnea    uses CPAP   Statin intolerance      PAST SURGICAL HISTORY:  Past Surgical History:  Procedure Laterality Date   BREATH TEK H PYLORI  09/18/2011   Procedure: BREATH TEK H PYLORI;  Surgeon: Shann Medal, MD;  Location: [facility];  Service: General;  Laterality: N/A;   CESAREAN SECTION     x 3   LAPAROSCOPIC APPENDECTOMY N/A 10/03/2017   Procedure: APPENDECTOMY LAPAROSCOPIC;  Surgeon: Leighton Ruff, MD;  Location: [facility];  Service: General;  Laterality: N/A;   RIGHT/LEFT HEART CATH AND CORONARY ANGIOGRAPHY N/A 02/12/2018    Procedure: RIGHT/LEFT HEART CATH AND CORONARY ANGIOGRAPHY;  Surgeon: Jettie Booze, MD;  Location: [facility];  Service: Cardiovascular;  Laterality: N/A;   TUBAL LIGATION      OB/GYN HISTORY:  OB History  Gravida Para Term Preterm AB Living  3 3          SAB IAB Ectopic Multiple Live Births               # Outcome Date GA Lbr Len/2nd Weight Sex Delivery Anes PTL Lv  3 Para           2 Para           1 Para             No LMP recorded. Patient is postmenopausal.  Age at menarche: 34 Age at menopause: 81 Hx of HRT: Denies Hx of STDs: Denies Last pap: 10/27/21 - atypical endometrial cells History of abnormal pap smears: Yes, recent Pap smear  SCREENING STUDIES:  Last mammogram: 2021  Last colonoscopy: 2013 Last bone mineral density: 2022  MEDICATIONS: Outpatient Encounter Medications as of 11/07/2021  Medication Sig   albuterol (PROAIR HFA) 108 (90 Base) MCG/ACT inhaler Inhale 2 puffs into  the lungs every 6 (six) hours as needed. wheezing  ° aspirin 81 MG chewable tablet Chew 1 tablet (81 mg total) by mouth daily.  ° bismuth subsalicylate (PEPTO BISMOL) 262 MG chewable tablet Chew 524 mg by mouth as needed for indigestion or diarrhea or loose stools.   ° carvedilol (COREG) 12.5 MG tablet Take 1 tablet (12.5 mg total) by mouth 2 (two) times daily.  ° diphenhydrAMINE HCl (BENADRYL ALLERGY PO) Take 1 tablet by mouth as needed.  ° diphenoxylate-atropine (LOMOTIL) 2.5-0.025 MG tablet Take 1 tablet by mouth 4 (four) times daily as needed for diarrhea or loose stools.  ° Dulaglutide (TRULICITY) 3 MG/0.5ML SOPN Inject 3 mg into the skin once a week.  ° Evolocumab (REPATHA SURECLICK) 140 MG/ML SOAJ Inject 1 pen into the skin every 14 (fourteen) days.  ° furosemide (LASIX) 40 MG tablet Take 1 tablet (40 mg total) by mouth 2 (two) times daily.  ° insulin glargine-yfgn (SEMGLEE) 100 UNIT/ML Pen Inject 25-40 Units into the skin daily based on blood sugar levels via sliding scale.  °  levothyroxine (SYNTHROID) 50 MCG tablet Take 1 tablet (50 mcg total) by mouth daily before breakfast.  ° magnesium oxide (MAG-OX) 400 MG tablet Take 400 mg by mouth at bedtime as needed (for cramps).  ° metFORMIN (GLUCOPHAGE) 1000 MG tablet Take 1 tablet (1,000 mg total) by mouth 2 (two) times daily with a meal.  ° Multiple Vitamin (MULITIVITAMIN WITH MINERALS) TABS Take 1 tablet by mouth daily with breakfast.  ° sacubitril-valsartan (ENTRESTO) 49-51 MG TAKE 1 TABLET BY MOUTH 2 (TWO) TIMES DAILY.  ° spironolactone (ALDACTONE) 25 MG tablet TAKE 1 TABLET (25 MG TOTAL) BY MOUTH DAILY.  ° triamcinolone ointment (KENALOG) 0.1 % Apply 1 application topically two times daily to affected area(s) as needed, sparing use to avoid whitening/thinning skin  ° blood glucose meter kit and supplies Use up to four times daily as directed. (FOR ICD-10 E10.9, E11.9).  ° glucose blood test strip Use as directed to test blood sugar  ° Insulin Pen Needle (UNIFINE PENTIPS) 32G X 4 MM MISC USE AS DIRECTED ONCE A DAY  ° OneTouch Delica Lancets 33G MISC use as directed  ° [DISCONTINUED] insulin detemir (LEVEMIR) 100 UNIT/ML FlexPen Inject 10 Units into the skin daily.  ° °No facility-administered encounter medications on file as of 11/07/2021.  ° ° °ALLERGIES:  °Allergies  °Allergen Reactions  ° Amoxicillin Other (See Comments)  °  Dizziness, abdominal pain  ° Adhesive [Tape] Rash  ° Exenatide Other (See Comments)  °  Flu-like symptoms  ° Invokana [Canagliflozin] Other (See Comments)  °  Yeast infection/dehydration  ° Jardiance [Empagliflozin] Other (See Comments)  °  Yeast infections and dehydration  ° Lisinopril Other (See Comments)  °  cramping  ° Other Diarrhea and Other (See Comments)  °  Kale=food ° °Per patient, it causes GI bleeding   ° Statins Other (See Comments)  °  Leg cramps  °  ° °FAMILY HISTORY:  °Family History  °Problem Relation Age of Onset  ° Alcohol abuse Mother   ° Arthritis Mother   ° Diabetes Mother   ° Sleep apnea  Mother   ° Anxiety disorder Mother   ° Eating disorder Mother   ° Obesity Mother   ° Cancer Father   ° Alcohol abuse Father   ° Heart disease Father   ° Hypertension Father   ° Stroke Father   ° Sleep apnea Sister   ° Breast cancer Neg Hx   °   Colon cancer Neg Hx   ° Ovarian cancer Neg Hx   ° Endometrial cancer Neg Hx   ° Pancreatic cancer Neg Hx   ° Prostate cancer Neg Hx   °  ° °SOCIAL HISTORY:  °Social Connections: Not on file  ° ° °REVIEW OF SYSTEMS:  °+ Vaginal bleeding, vaginal discharge. °Denies appetite changes, fevers, chills, fatigue, unexplained weight changes. °Denies hearing loss, neck lumps or masses, mouth sores, ringing in ears or voice changes. °Denies cough or wheezing.  Denies shortness of breath. °Denies chest pain or palpitations. Denies leg swelling. °Denies abdominal distention, pain, blood in stools, constipation, diarrhea, nausea, vomiting, or early satiety. °Denies pain with intercourse, dysuria, frequency, hematuria or incontinence. °Denies hot flashes.   °Denies joint pain or muscle pain/cramps. °Denies itching, rash, or wounds. °Denies dizziness, headaches, numbness or seizures. °Denies swollen lymph nodes or glands, denies easy bruising or bleeding. °Denies anxiety, depression, confusion, or decreased concentration. ° °Physical Exam:  °Vital Signs for this encounter:  °Blood pressure 119/60, pulse 94, temperature 98.2 °F (36.8 °C), temperature source Oral, resp. rate 18, height 5' 6.54" (1.69 m), weight 293 lb (132.9 kg), SpO2 99 %. °Body mass index is 46.53 kg/m². °General: Alert, oriented, no acute distress.  °HEENT: Normocephalic, atraumatic. Sclera anicteric.  °Chest: Clear to auscultation bilaterally although somewhat distant breath sounds. No wheezes, rhonchi, or rales. °Cardiovascular: Regular rate and rhythm, no murmurs, rubs, or gallops.  °Abdomen: Obese. Normoactive bowel sounds. Soft, nondistended, nontender to palpation. No masses or hepatosplenomegaly appreciated. No  palpable fluid wave.  Healed midline laparotomy. °Extremities: Grossly normal range of motion. Warm, well perfused. No edema bilaterally.  °Skin: No rashes or lesions.  °Lymphatics: No cervical, supraclavicular, or inguinal adenopathy.  °GU:  Normal external female genitalia. No lesions. No discharge or bleeding. °            Bladder/urethra:  No lesions or masses, well supported bladder °            Vagina: No lesions, small amount of older appearing blood within the vault. °            Cervix: Normal appearing, no lesions.  Atrophic in appearance. °            Uterus:  Small, mobile, no parametrial involvement or nodularity. °            Adnexa: No masses appreciated although exam somewhat limited by body habitus. ° Rectal: Deferred. ° °LABORATORY AND RADIOLOGIC DATA:  °Outside medical records were reviewed to synthesize the above history, along with the history and physical obtained during the visit.  ° °Lab Results  °Component Value Date  ° WBC 8.4 09/09/2020  ° HGB 13.4 09/09/2020  ° HCT 40.5 09/09/2020  ° PLT 303 09/09/2020  ° GLUCOSE 312 (H) 06/23/2021  ° CHOL 175 07/14/2020  ° TRIG 208 (H) 07/14/2020  ° HDL 52 07/14/2020  ° LDLCALC 88 07/14/2020  ° ALT 14 01/06/2021  ° AST 12 (L) 01/06/2021  ° NA 135 06/23/2021  ° K 4.3 06/23/2021  ° CL 98 06/23/2021  ° CREATININE 0.98 06/23/2021  ° BUN 16 06/23/2021  ° CO2 28 06/23/2021  ° TSH 2.100 10/25/2021  ° HGBA1C 11.4 (H) 01/06/2021  ° MICROALBUR 2.2 03/14/2016  ° ° °

## 2021-11-08 ENCOUNTER — Telehealth: Payer: Self-pay | Admitting: *Deleted

## 2021-11-08 LAB — HEMOGLOBIN A1C
Hgb A1c MFr Bld: 11 % — ABNORMAL HIGH (ref 4.8–5.6)
Mean Plasma Glucose: 269 mg/dL

## 2021-11-08 NOTE — Telephone Encounter (Signed)
Spoke with pt this morning to review her Hgb A1C of 11. Pt stated she already saw it on her mychart and would like to do another HgbA1c draw in June to see if will change after her life style changes. No further questions or concerns voiced. Melissa, NP made aware.  ?

## 2021-11-09 ENCOUNTER — Telehealth (HOSPITAL_BASED_OUTPATIENT_CLINIC_OR_DEPARTMENT_OTHER): Payer: Self-pay

## 2021-11-09 NOTE — Telephone Encounter (Signed)
Patient called in today requesting to speak with Dr. [name]. Patient states she has received a diagnosis from the oncologist. She would like to discuss that information as well as some concerns about the procedure that she will be having. Please give patient a call. tbw ?

## 2021-11-10 NOTE — Progress Notes (Signed)
Patient here for new patient consultation with Dr. Jeral Pinch and for a pre-operative discussion prior to her scheduled surgery on November 15, 2021. She is scheduled for dilation and curettage of the uterus, possible hysteroscopy/myosure, Mirena intrauterine device placement. The surgery was discussed in detail.  See after visit summary for additional details.  ?  ?Discussed post-op pain management in detail including the aspects of the enhanced recovery pathway.  We discussed the use of tylenol post-op and to monitor for a maximum of 4,000 mg in a 24 hour period. Discussed bowel regimen in detail.   ?  ?Discussed SCDs and measures to take at home to prevent DVT including frequent mobility.  Reportable signs and symptoms of DVT discussed. Post-operative instructions discussed and expectations for after surgery.  ?   ?5 minutes spent with the patient.  Verbalizing understanding of material discussed. No needs or concerns voiced at the end of the visit. Advised patient to call for any needs.   ? ?This appointment is included in the global surgical bundle as pre-operative teaching and has no charge.     ?

## 2021-11-10 NOTE — Patient Instructions (Signed)
Preparing for your Surgery ?  ?Plan for surgery on November 15, 2021 with Dr. Jeral Pinch at [facility] will be scheduled for dilation and curettage of the uterus (scraping/sampling from the lining of the uterus), possible hysteroscopy/myosure (using camera), Mirena intrauterine device placement.  ?  ?We will need to obtain clearance from your cardiologist and medical provider. ?  ?Plan to also have an MRI before surgery and Dr. Berline Lopes will notify you of the results.  ?  ?Pre-operative Testing ?-You will receive a phone call from presurgical testing at [facility] to arrange for a pre-op appointment at the hospital. ?  ?-Bring your insurance card, copy of an advanced directive if applicable, medication list. ?  ?-You can continue taking your aspirin 81 mg with the last dose being the day before surgery. ?  ?-Do not take supplements such as fish oil (omega 3), red yeast rice, turmeric before your surgery. You want to avoid medications with aspirin in them including headache powders such as BC or Goody's), Excedrin migraine. ?  ?Day Before Surgery at Home ?-You will be advised you can have clear liquids up until 3 hours before your surgery.   ?  ?Your role in recovery ?Your role is to become active as soon as directed by your doctor, while still giving yourself time to heal.  Rest when you feel tired. You will be asked to do the following in order to speed your recovery: ?  ?- Cough and breathe deeply. This helps to clear and expand your lungs and can prevent pneumonia after surgery.  ?- STAY ACTIVE WHEN YOU GET HOME. Do mild physical activity. Walking or moving your legs help your circulation and body functions return to normal. Do not try to get up or walk alone the first time after surgery.   ?-If you develop swelling on one leg or the other, pain in the back of your leg, redness/warmth in one of your legs, please call the office or go to the Emergency Room to have a doppler to rule out a  blood clot. For shortness of breath, chest pain-seek care in the Emergency Room as soon as possible. ?- Actively manage your pain. Managing your pain lets you move in comfort. We will ask you to rate your pain on a scale of zero to 10. It is your responsibility to tell your doctor or nurse where and how much you hurt so your pain can be treated. ?  ?Special Considerations ?-Your final pathology results from surgery should be available around one week after surgery and the results will be relayed to you when available. ?  ?-FMLA forms can be faxed to [phone] and please allow 5-7 business days for completion. ?  ?Pain Management After Surgery ?-Make sure that you have Tylenol and Ibuprofen if you are able to take these medications at home to use on a regular basis after surgery for pain control. We recommend alternating the medications every hour to six hours since they work differently and are processed in the body differently for pain relief. ?  ?-Review the attached handout on narcotic use and their risks and side effects.  ?  ?Bowel Regimen ?-It is important to prevent constipation and drink adequate amounts of liquids.  ?  ?Risks of Surgery ?Risks of surgery are low but include bleeding, infection, damage to surrounding structures, re-operation, blood clots, and very rarely death. ?  ?AFTER SURGERY INSTRUCTIONS ?  ?Return to work:  1-2 days if applicable ?  ?  Activity: ?1. Be up and out of the bed during the day.  Take a nap if needed.  You may walk up steps but be careful and use the hand rail.  Stair climbing will tire you more than you think, you may need to stop part way and rest.  ?  ?2. No lifting or straining for 1 week over 10 pounds. No pushing, pulling, straining for 1 week. ?  ?3. No driving for minimum 24 hours after surgery if you were able to drive before.  Do not drive if you are taking narcotic pain medicine and make sure that your reaction time has returned.  ?  ?4. You can shower as soon as  the next day after surgery. Shower daily. No tub baths or submerging your body in water until cleared by your surgeon. If you have the soap that was given to you by pre-surgical testing that was used before surgery, you do not need to use it afterwards because this can irritate your incisions.  ?  ?5. No sexual activity and nothing in the vagina for 4 weeks. ?  ?6. You may experience vaginal spotting and discharge after surgery.  The spotting is normal but if you experience heavy bleeding, call our office. ?  ?7. Take Tylenol or ibuprofen for pain. Monitor your Tylenol intake to a max of 4,000 mg in a 24 hour period.  ?  ?Diet: ?1. Low sodium Heart Healthy Diet is recommended but you are cleared to resume your normal (before surgery) diet after your procedure. ?  ?2. It is safe to use a laxative, such as Miralax or Colace, if you have difficulty moving your bowels.  ?  ?Reasons to call the Doctor: ?Fever - Oral temperature greater than 100.4 degrees Fahrenheit ?Foul-smelling vaginal discharge ?Difficulty urinating ?Nausea and vomiting ?Difficulty breathing with or without chest pain ?New calf pain especially if only on one side ?Sudden, continuing increased vaginal bleeding with or without clots. ?  ?Contacts: ?For questions or concerns you should contact: ?  ?Dr. Jeral Pinch at [phone] ?  ?Joylene John, NP at [phone] ?  ?After Hours: call [phone] and have the GYN Oncologist paged/contacted (after 5 pm or on the weekends). ?  ?Messages sent via mychart are for non-urgent matters and are not responded to after hours so for urgent needs, please call the after hours number. ?  ?

## 2021-11-11 ENCOUNTER — Encounter (HOSPITAL_BASED_OUTPATIENT_CLINIC_OR_DEPARTMENT_OTHER): Payer: Self-pay | Admitting: Obstetrics & Gynecology

## 2021-11-11 ENCOUNTER — Telehealth (HOSPITAL_BASED_OUTPATIENT_CLINIC_OR_DEPARTMENT_OTHER): Payer: Self-pay | Admitting: *Deleted

## 2021-11-11 NOTE — Telephone Encounter (Signed)
Dr.Miller requested that I call patient.  Dr [name] spoke to Dr Berline Lopes, pt's HGB A1C is at 12 which means surgery cannot be proceeded on Tuesday 11/15/21 w/ Dr. Berline Lopes.  Next treatment option is for patient to present to our office to have an endometrial suction with a Rocket curette and insertion of IUD.  Per Dr [name] the patient may have more spotting post this procedure than if she were able to proceed with the [facility] Hysterscopy in the OR with Dr Berline Lopes and the patient understands this.  We discussed potential dates and I will call her back after looking at the schedule.  KW CMA ? ?After reviewing schedules and inquiring with her insurance, a prior authorization is needed for the in office endometrial curette and IUD w/insertion.  Dr [name] dictated a letter for me to send with records for prior authorization.  PT was informed that we have initiated this and will call her to schedule once we received an approval. PT understands. KW CMA ?

## 2021-11-14 ENCOUNTER — Encounter (HOSPITAL_COMMUNITY): Payer: Self-pay

## 2021-11-14 ENCOUNTER — Ambulatory Visit (HOSPITAL_COMMUNITY)
Admission: RE | Admit: 2021-11-14 | Discharge: 2021-11-14 | Disposition: A | Discharge status: Home / Self Care | Payer: 59 | Source: Ambulatory Visit | Attending: Gynecologic Oncology | Admitting: Gynecologic Oncology

## 2021-11-14 ENCOUNTER — Other Ambulatory Visit: Payer: Self-pay

## 2021-11-14 ENCOUNTER — Encounter (HOSPITAL_COMMUNITY): Payer: 59

## 2021-11-14 DIAGNOSIS — C541 Malignant neoplasm of endometrium: Secondary | ICD-10-CM | POA: Diagnosis present

## 2021-11-14 MED ORDER — GADOBUTROL 1 MMOL/ML IV SOLN
10.0000 mL | Freq: Once | INTRAVENOUS | Status: AC | PRN
  Administered 2021-11-14: 10 mL via INTRAVENOUS

## 2021-11-15 ENCOUNTER — Other Ambulatory Visit: Payer: Self-pay | Admitting: Gynecologic Oncology

## 2021-11-15 ENCOUNTER — Encounter (HOSPITAL_COMMUNITY): Admission: RE | Payer: Self-pay | Source: Home / Self Care

## 2021-11-15 ENCOUNTER — Ambulatory Visit (HOSPITAL_COMMUNITY): Admission: RE | Admit: 2021-11-15 | Payer: 59 | Source: Home / Self Care | Admitting: Gynecologic Oncology

## 2021-11-15 DIAGNOSIS — C541 Malignant neoplasm of endometrium: Secondary | ICD-10-CM

## 2021-11-15 SURGERY — DILATATION AND CURETTAGE /HYSTEROSCOPY
Anesthesia: Monitor Anesthesia Care

## 2021-11-15 NOTE — Progress Notes (Signed)
Called patient, discussed MRI findings which show deeply invasive tumor, concern for early parametrial invasion and upper cervical invasion. After review images with Dr. Sondra Come, I am recommending that we proceed with pelvic RT + brachytherapy =/- cisplatin, with tentative plan for interval surgery depending on treatment response, glycemic control and other comorbidities. Patient voiced understanding, questions answered. Santiago Glad will reach out to get her scheduled with Dr. Sondra Come and to get CT abd scheduled.  ? ?Plan for review at tumor board on Monday. ? ?Patient is doing keto diet again, has lost 9 lbs and fasting cbgs have been 150 last 4 days. ? ?Jeral Pinch MD ?Gynecologic Oncology ? ?

## 2021-11-16 ENCOUNTER — Encounter: Payer: Self-pay | Admitting: Oncology

## 2021-11-16 DIAGNOSIS — C541 Malignant neoplasm of endometrium: Secondary | ICD-10-CM

## 2021-11-16 NOTE — Progress Notes (Signed)
Called Terri Heath and advised her of upcoming appointments for lab and CT on 11/24/21 and Dr. Sondra Come on 11/28/21. Also discussed that I will call her on Monday and let her the recommendations from the [facility] cancer conference.  She verbalized understanding and will pick up her contrast on Wednesday. ?

## 2021-11-17 ENCOUNTER — Telehealth: Payer: Self-pay | Admitting: Oncology

## 2021-11-17 NOTE — Telephone Encounter (Signed)
Terri Heath called back and is wondering about when her blood sugar/A1C will be checked again.  She would like to have surgery if possible and is concerned about going through radiation/chemo because she has watched a friend go through it.  Advised her that I will check with Dr. Berline Lopes and call her back. ?

## 2021-11-17 NOTE — Telephone Encounter (Signed)
Called Terri Heath back and advised her that Dr. Berline Lopes would like her to have radiation first before treatment due to her MRI results.  This will give her the chance for interval surgery depending on treatment response and also a chance for glycemic control.  She verbalized understanding and agreement. ? ?She also said she is trying to arrange rides for her radiation treatments. Discussed the [facility] transportation program and she is interested in a referral. ?

## 2021-11-21 ENCOUNTER — Other Ambulatory Visit: Payer: Self-pay | Admitting: Oncology

## 2021-11-21 NOTE — Progress Notes (Incomplete)
GYN Location of Tumor / Histology: Endometrial ? ?Terri Heath presented with symptoms of: postmenopausal bleeding ? ?Biopsies revealed:  ?A. ENDOMETRIUM, BIOPSY:  ?- Endometrioid adenocarcinoma with focal secretory features.  ? ?Past/Anticipated interventions by Gyn/Onc surgery, if any:  ?Her multiple medical comorbidities including her obesity, poorly controlled diabetes, cardiac disease, and sleep apnea places her at significant risk with regard to surgery. ?recommendation is that we proceed with Mirena IUD placement while the patient works on improving glycemic control ? ?Past/Anticipated interventions by medical oncology, if any: Terri Heath is recommending that we proceed with pelvic RT + brachytherapy =/- cisplatin, with tentative plan for interval surgery depending on treatment response, glycemic control and other comorbidities. ? ?Weight changes, if any: {:18581} ? ?Bowel/Bladder complaints, if any: {yes no:314532}, {Blank single:19197::"diarrhea","constipation","urinary frequency","burning","trouble emptying bladder"," "} ? ?Nausea/Vomiting, if any: {:18581} ? ?Pain issues, if any:  {:18581} ? ?SAFETY ISSUES: ?Prior radiation? {:18581} ?Pacemaker/ICD? {:18581} ?Possible current pregnancy? no, postmenopausal ?Is the patient on methotrexate? {:18581} ? ?Current Complaints / other details:  *** ? ?*** ? ?

## 2021-11-21 NOTE — Progress Notes (Signed)
Gynecologic Oncology Multi-Disciplinary Disposition Conference Note ? ?Date of the Conference: 11/21/2021 ? ?Patient Name: Terri Heath  ?Referring Provider: Dr. [name] ?Primary GYN Oncologist: Dr. Berline Lopes ? ?Stage/Disposition:  Stage II endometrioid adenocarcinoma. Disposition is for CT imaging followed by pelvic radiation therapy plus brachytherapy followed by interval surgery..  ? ?This Multidisciplinary conference took place involving physicians from [place], Medical Oncology, Radiation Oncology, Pathology, Radiology along with the Gynecologic Oncology Nurse Practitioner and Gynecologic Oncology Nurse Navigator.  Comprehensive assessment of the patient's malignancy, staging, need for surgery, chemotherapy, radiation therapy, and need for further testing were reviewed. Supportive measures, both inpatient and following discharge were also discussed. The recommended plan of care is documented. Greater than 35 minutes were spent correlating and coordinating this patient's care.  ?

## 2021-11-23 ENCOUNTER — Telehealth: Payer: 59 | Admitting: Gynecologic Oncology

## 2021-11-23 ENCOUNTER — Telehealth: Payer: Self-pay | Admitting: Oncology

## 2021-11-23 NOTE — Telephone Encounter (Signed)
Called Terri Heath and went over her appointments for labs and CT tomorrow.  Advised her that we will call her after her CT to let her know about recommendations for chemotherapy.  She verbalized understanding and agreement. ?

## 2021-11-24 ENCOUNTER — Ambulatory Visit (HOSPITAL_COMMUNITY)
Admission: RE | Admit: 2021-11-24 | Discharge: 2021-11-24 | Disposition: A | Discharge status: Home / Self Care | Payer: 59 | Source: Ambulatory Visit | Attending: Gynecologic Oncology | Admitting: Gynecologic Oncology

## 2021-11-24 ENCOUNTER — Other Ambulatory Visit: Payer: Self-pay

## 2021-11-24 ENCOUNTER — Inpatient Hospital Stay: Payer: 59

## 2021-11-24 DIAGNOSIS — C541 Malignant neoplasm of endometrium: Secondary | ICD-10-CM | POA: Diagnosis present

## 2021-11-24 LAB — BASIC METABOLIC PANEL - CANCER CENTER ONLY
Anion gap: 10 (ref 5–15)
BUN: 21 mg/dL (ref 8–23)
CO2: 26 mmol/L (ref 22–32)
Calcium: 9.7 mg/dL (ref 8.9–10.3)
Chloride: 100 mmol/L (ref 98–111)
Creatinine: 0.93 mg/dL (ref 0.44–1.00)
GFR, Estimated: >60 mL/min (ref >60)
Glucose, Bld: 163 mg/dL — ABNORMAL HIGH (ref 70–99)
Potassium: 4.2 mmol/L (ref 3.5–5.1)
Sodium: 136 mmol/L (ref 135–145)

## 2021-11-24 MED ORDER — IOHEXOL 300 MG/ML  SOLN
100.0000 mL | Freq: Once | INTRAMUSCULAR | Status: AC | PRN
  Administered 2021-11-24: 100 mL via INTRAVENOUS

## 2021-11-28 ENCOUNTER — Ambulatory Visit: Payer: 59

## 2021-11-28 ENCOUNTER — Ambulatory Visit: Admission: RE | Admit: 2021-11-28 | Payer: 59 | Source: Ambulatory Visit | Admitting: Radiation Oncology

## 2021-11-29 NOTE — Progress Notes (Signed)
?Radiation Oncology         [phone] ?________________________________ ? ?Initial Outpatient Consultation ? ?Name: Terri Heath MRN: [id number]  ?Date: 11/30/2021  DOB: 1956-01-31 ? ?DZ:HGDJM, Coralee Pesa, NP  [name], MD  ? ?REFERRING PHYSICIAN: [name], MD ? ?DIAGNOSIS: The encounter diagnosis was Endometrial cancer ([place]). ? ?Biopsy proven FIGO grade 1/2 endometrioid adenocarcinoma  ? ?HISTORY OF PRESENT ILLNESS::Terri Heath is a 66 y.o. female who is is seen as a courtesy of Dr. Berline Lopes for an opinion concerning radiation therapy as part of management for her recently diagnosed endometrial cancer.  ? ?The patient developed postmenopausal bleeding on 09/29/2021 which began as spotting with some mild abdominal cramping. This continued until 10/06/2021 when her bleeding became significantly heavier. Accordingly, she met virtually with her PCP on 10/11/21 for further evaluation. During this visit, she reported severe cramping and passage of clots lasting until 10/09/2021. The day after, the cramping resolved and she only noticed little to no spotting in the following weeks. Before she could meet with her gynecologist, she returned to her PCP on 10/25/21 with recurrence of heavy bleeding with clots (requiring 1-3 sanitary pads/day), and cramping. She also endorsed new onset of mood swings, headaches, and low energy levels.  ? ?Subsequent transvaginal US performed for evaluation on 10/26/21 showed a thickened and heterogeneous endometrial complex measuring up to 16 mm, with scattered areas of internal vascularity. ([place] also showed two prominent nabothian cysts measuring up to 1.7 cm).  ? ?Accordingly, the patient met with her gynecologist, Dr. [name], on 10/27/21 who collected endometrial biopsies. 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 Pap smear also performed during this visit showed presence of atypical endometrial cells.  ? ?The patient  was then referred to Dr. Berline Lopes on 11/07/21.  Following discussion of the risks and benefits, the patient agreed to proceed with D&C and hysteroscopy. However, given her notable history of hypertension and CHF, the patient required clearance from cardiology and pre-operative MRI. Pelvic exam performed during this visit showed no abnormalities.  ? ?Pelvic MRI on 11/14/21 again demonstrated a thickened (18 mm) endometrium, compatible with known endometrial ?malignancy. Evidence of full-thickness myometrial invasion by endometrial tumor throughout the left uterine body was also appreciated, extending over 180 degrees of involvement. Probable early small focus of direct tumor invasion was also seen into the parametrial soft tissues in the anterior left uterine body. No pelvic lymphadenopathy or adnexal masses were appreciated. ? ?Given findings on MRI, Dr. Berline Lopes advised the patient to pursue XRT prior to her procedure to give her the chance for interval surgery depending on treatment response (and also a chance for glycemic control).  ? ?The patient's case was discussed at the tumor board held on 11/21/21. Disposition concluded was for CT imaging followed by pelvic radiation therapy plus brachytherapy followed by interval surgery.  ? ?Subsequent CT of the abdomen with contrast on 11/24/21 showed no evidence of abdominal metastatic disease or other acute findings. Other findings on CT included mild hepatic stenosis, and colonic diverticulosis without evidence of diverticulitis. ? ? ?PREVIOUS RADIATION THERAPY: No ? ?PAST MEDICAL HISTORY:  ?Past Medical History:  ?Diagnosis Date  ? Acute systolic heart failure ([place])   ? Allergy   ? Asthma   ? Bilateral shoulder pain   ? Borderline hypertension   ? Bronchitis   ? CAD (coronary artery disease)   ? Chronic systolic CHF (congestive heart failure) ([place])   ? Chronic systolic congestive heart failure,  NYHA class 3 ([place]) 07/24/2018  ? Depression 09/15/2013  ? Last Assessment &  Plan:  Patient has been reading self help material. Looking for higher energy individuals to spend time with and no longer aloows herself to be bullied.  ? Diabetes mellitus   ? Diagnosed in 2000, on insulin, Trulicity and metformin, not checking cgs at home  ? Gangrenous appendicitis with perforation s/p lap appendectomy 10/04/2017 10/04/2017  ? History of MI (myocardial infarction)   ? Hyperlipidemia   ? Hypertension   ? Hypothyroidism   ? MGUS (monoclonal gammopathy of unknown significance)   ? Mitral regurgitation   ? Neuropathy   ? Obesity   ? Sleep apnea   ? uses CPAP  ? Statin intolerance   ? Uterine cancer ([place])   ? ? ?PAST SURGICAL HISTORY: ?Past Surgical History:  ?Procedure Laterality Date  ? BREATH TEK H PYLORI  09/18/2011  ? Procedure: BREATH TEK H PYLORI;  Surgeon: Shann Medal, MD;  Location: [facility];  Service: General;  Laterality: N/A;  ? CESAREAN SECTION    ? x 3  ? LAPAROSCOPIC APPENDECTOMY N/A 10/03/2017  ? Procedure: APPENDECTOMY LAPAROSCOPIC;  Surgeon: Leighton Ruff, MD;  Location: [facility];  Service: General;  Laterality: N/A;  ? RIGHT/LEFT HEART CATH AND CORONARY ANGIOGRAPHY N/A 02/12/2018  ? Procedure: RIGHT/LEFT HEART CATH AND CORONARY ANGIOGRAPHY;  Surgeon: Jettie Booze, MD;  Location: [facility];  Service: Cardiovascular;  Laterality: N/A;  ? TUBAL LIGATION    ? ? ?FAMILY HISTORY:  ?Family History  ?Problem Relation Age of Onset  ? Alcohol abuse Mother   ? Arthritis Mother   ? Diabetes Mother   ? Sleep apnea Mother   ? Anxiety disorder Mother   ? Eating disorder Mother   ? Obesity Mother   ? Cancer Father   ? Alcohol abuse Father   ? Heart disease Father   ? Hypertension Father   ? Stroke Father   ? Sleep apnea Sister   ? Breast cancer Neg Hx   ? Colon cancer Neg Hx   ? Ovarian cancer Neg Hx   ? Endometrial cancer Neg Hx   ? Pancreatic cancer Neg Hx   ? Prostate cancer Neg Hx   ? ? ?SOCIAL HISTORY:  ?Social History  ? ?Tobacco Use  ? Smoking status: Former  ?  Packs/day:  1.00  ?  Years: 15.00  ?  Pack years: 15.00  ?  Types: Cigarettes  ?  Quit date: 01/28/1990  ?  Years since quitting: 31.8  ? Smokeless tobacco: Never  ?Vaping Use  ? Vaping Use: Never used  ?Substance Use Topics  ? Alcohol use: Yes  ?  Comment: occasional  ? Drug use: No  ? ? ?ALLERGIES:  ?Allergies  ?Allergen Reactions  ? Amoxicillin Other (See Comments)  ?  Dizziness, abdominal pain  ? Adhesive [Tape] Rash  ? Exenatide Other (See Comments)  ?  Flu-like symptoms  ? Invokana [Canagliflozin] Other (See Comments)  ?  Yeast infection/dehydration  ? Jardiance [Empagliflozin] Other (See Comments)  ?  Yeast infections and dehydration  ? Lisinopril Other (See Comments)  ?  cramping  ? Other Diarrhea and Other (See Comments)  ?  Kale=food ? ?Per patient, it causes GI bleeding   ? Statins Other (See Comments)  ?  Leg cramps  ? ? ?MEDICATIONS:  ?Current Outpatient Medications  ?Medication Sig Dispense Refill  ? acetaminophen (TYLENOL) 500 MG tablet Take 500 mg by mouth every  6 (six) hours as needed for moderate pain.    ? albuterol (PROAIR HFA) 108 (90 Base) MCG/ACT inhaler Inhale 2 puffs into the lungs every 6 (six) hours as needed. wheezing 18 g 2  ? aspirin 81 MG chewable tablet Chew 1 tablet (81 mg total) by mouth daily. 90 tablet 3  ? bismuth subsalicylate (PEPTO BISMOL) 262 MG chewable tablet Chew 524 mg by mouth as needed for indigestion or diarrhea or loose stools.     ? blood glucose meter kit and supplies Use up to four times daily as directed. (FOR ICD-10 E10.9, E11.9). 1 each 99  ? carvedilol (COREG) 12.5 MG tablet Take 1 tablet (12.5 mg total) by mouth 2 (two) times daily. 180 tablet 3  ? diphenhydrAMINE (BENADRYL) 25 MG tablet Take 25 mg by mouth every 6 (six) hours as needed for allergies.    ? diphenoxylate-atropine (LOMOTIL) 2.5-0.025 MG tablet Take 1 tablet by mouth 4 (four) times daily as needed for diarrhea or loose stools. 30 tablet 0  ? Dulaglutide (TRULICITY) 3 AC/1.6SA SOPN Inject 3 mg into the skin  once a week. 6 mL 3  ? Evolocumab (REPATHA SURECLICK) 630 MG/ML SOAJ Inject 1 pen into the skin every 14 (fourteen) days. 2 mL 11  ? furosemide (LASIX) 40 MG tablet Take 1 tablet (40 mg total) by mouth 2

## 2021-11-30 ENCOUNTER — Other Ambulatory Visit: Payer: Self-pay

## 2021-11-30 ENCOUNTER — Ambulatory Visit
Admission: RE | Admit: 2021-11-30 | Discharge: 2021-11-30 | Disposition: A | Discharge status: Home / Self Care | Payer: 59 | Source: Ambulatory Visit | Attending: Radiation Oncology | Admitting: Radiation Oncology

## 2021-11-30 ENCOUNTER — Encounter: Payer: Self-pay | Admitting: Radiation Oncology

## 2021-11-30 ENCOUNTER — Encounter: Payer: Self-pay | Admitting: Oncology

## 2021-11-30 ENCOUNTER — Telehealth: Payer: 59 | Admitting: Gynecologic Oncology

## 2021-11-30 VITALS — BP 96/68 | HR 96 | Temp 96.4°F | Resp 18 | Ht 66.5 in | Wt 295.5 lb

## 2021-11-30 DIAGNOSIS — Z923 Personal history of irradiation: Secondary | ICD-10-CM | POA: Insufficient documentation

## 2021-11-30 DIAGNOSIS — Z7984 Long term (current) use of oral hypoglycemic drugs: Secondary | ICD-10-CM | POA: Insufficient documentation

## 2021-11-30 DIAGNOSIS — R519 Headache, unspecified: Secondary | ICD-10-CM | POA: Insufficient documentation

## 2021-11-30 DIAGNOSIS — K5730 Diverticulosis of large intestine without perforation or abscess without bleeding: Secondary | ICD-10-CM | POA: Insufficient documentation

## 2021-11-30 DIAGNOSIS — C541 Malignant neoplasm of endometrium: Secondary | ICD-10-CM

## 2021-11-30 DIAGNOSIS — Z79899 Other long term (current) drug therapy: Secondary | ICD-10-CM | POA: Insufficient documentation

## 2021-11-30 DIAGNOSIS — I2510 Atherosclerotic heart disease of native coronary artery without angina pectoris: Secondary | ICD-10-CM | POA: Insufficient documentation

## 2021-11-30 DIAGNOSIS — Z7982 Long term (current) use of aspirin: Secondary | ICD-10-CM | POA: Insufficient documentation

## 2021-11-30 DIAGNOSIS — N951 Menopausal and female climacteric states: Secondary | ICD-10-CM | POA: Diagnosis not present

## 2021-11-30 DIAGNOSIS — E119 Type 2 diabetes mellitus without complications: Secondary | ICD-10-CM | POA: Diagnosis not present

## 2021-11-30 DIAGNOSIS — Z87891 Personal history of nicotine dependence: Secondary | ICD-10-CM | POA: Insufficient documentation

## 2021-11-30 DIAGNOSIS — D472 Monoclonal gammopathy: Secondary | ICD-10-CM | POA: Insufficient documentation

## 2021-11-30 DIAGNOSIS — E785 Hyperlipidemia, unspecified: Secondary | ICD-10-CM | POA: Insufficient documentation

## 2021-11-30 DIAGNOSIS — E039 Hypothyroidism, unspecified: Secondary | ICD-10-CM | POA: Diagnosis not present

## 2021-11-30 DIAGNOSIS — K760 Fatty (change of) liver, not elsewhere classified: Secondary | ICD-10-CM | POA: Insufficient documentation

## 2021-11-30 DIAGNOSIS — I700 Atherosclerosis of aorta: Secondary | ICD-10-CM | POA: Diagnosis not present

## 2021-11-30 DIAGNOSIS — I509 Heart failure, unspecified: Secondary | ICD-10-CM | POA: Insufficient documentation

## 2021-11-30 DIAGNOSIS — G629 Polyneuropathy, unspecified: Secondary | ICD-10-CM | POA: Diagnosis not present

## 2021-11-30 DIAGNOSIS — I110 Hypertensive heart disease with heart failure: Secondary | ICD-10-CM | POA: Diagnosis not present

## 2021-11-30 HISTORY — DX: Malignant neoplasm of uterus, part unspecified: C55

## 2021-11-30 NOTE — Progress Notes (Signed)
See MD note for nursing evaluation. °

## 2021-11-30 NOTE — Progress Notes (Signed)
GYN Location of Tumor / Histology: Endometrial ? ?Terri Heath presented with symptoms of: postmenopausal bleeding ? ?Biopsies revealed:  ?A. ENDOMETRIUM, BIOPSY:  ?- Endometrioid adenocarcinoma with focal secretory features.  ? ?Past/Anticipated interventions by Gyn/Onc surgery, if any:  ?Her multiple medical comorbidities including her obesity, poorly controlled diabetes, cardiac disease, and sleep apnea places her at significant risk with regard to surgery. ?recommendation is that we proceed with Mirena IUD placement while the patient works on improving glycemic control ? ?Past/Anticipated interventions by medical oncology, if any: Dr Berline Lopes is recommending that we proceed with pelvic RT + brachytherapy =/- cisplatin, with tentative plan for interval surgery depending on treatment response, glycemic control and other comorbidities. ? ?Weight changes, if any: yes, 5 pound weight loss over 3 weeks ? ?Bowel/Bladder complaints, if any: Yes.  ,  nocturia x1 ? ?Nausea/Vomiting, if any: no ? ?Pain issues, if any:  no ? ?SAFETY ISSUES: ?Prior radiation? no ?Pacemaker/ICD? no ?Possible current pregnancy? no, postmenopausal ?Is the patient on methotrexate? no ? ?Current Complaints / other details:  postmenopausal bleeding that has resolved over last 2 weeks. ? ?Vitals:  ? 11/30/21 1248  ?BP: 96/68  ?Pulse: 96  ?Resp: 18  ?Temp: (!) 96.4 ?F (35.8 ?C)  ?TempSrc: Temporal  ?SpO2: 99%  ?Weight: 295 lb 8 oz (134 kg)  ?Height: 5' 6.5" (1.689 m)  ? ? ?

## 2021-11-30 NOTE — Progress Notes (Signed)
Met with Terri Heath and scheduled a new patient appointment with Dr. Alvy Bimler on 12/02/21 at 2:00 (arrive by 1:30). Also provided her with my business card and advised her to call with any questions or concerns. ?

## 2021-12-02 ENCOUNTER — Other Ambulatory Visit: Payer: Self-pay | Admitting: Emergency Medicine

## 2021-12-02 ENCOUNTER — Other Ambulatory Visit: Payer: Self-pay

## 2021-12-02 ENCOUNTER — Encounter: Payer: Self-pay | Admitting: Hematology and Oncology

## 2021-12-02 ENCOUNTER — Telehealth: Payer: Self-pay | Admitting: Emergency Medicine

## 2021-12-02 ENCOUNTER — Other Ambulatory Visit (HOSPITAL_COMMUNITY): Payer: Self-pay

## 2021-12-02 ENCOUNTER — Inpatient Hospital Stay (HOSPITAL_BASED_OUTPATIENT_CLINIC_OR_DEPARTMENT_OTHER): Payer: 59 | Admitting: Hematology and Oncology

## 2021-12-02 ENCOUNTER — Encounter: Payer: Self-pay | Admitting: Licensed Clinical Social Worker

## 2021-12-02 VITALS — BP 112/68 | HR 92 | Temp 97.6°F | Resp 18 | Ht 66.5 in | Wt 298.0 lb

## 2021-12-02 DIAGNOSIS — E669 Obesity, unspecified: Secondary | ICD-10-CM | POA: Insufficient documentation

## 2021-12-02 DIAGNOSIS — R7989 Other specified abnormal findings of blood chemistry: Secondary | ICD-10-CM | POA: Insufficient documentation

## 2021-12-02 DIAGNOSIS — Z5111 Encounter for antineoplastic chemotherapy: Secondary | ICD-10-CM | POA: Diagnosis not present

## 2021-12-02 DIAGNOSIS — C541 Malignant neoplasm of endometrium: Secondary | ICD-10-CM | POA: Insufficient documentation

## 2021-12-02 DIAGNOSIS — D472 Monoclonal gammopathy: Secondary | ICD-10-CM | POA: Insufficient documentation

## 2021-12-02 DIAGNOSIS — Z87891 Personal history of nicotine dependence: Secondary | ICD-10-CM | POA: Insufficient documentation

## 2021-12-02 DIAGNOSIS — E1142 Type 2 diabetes mellitus with diabetic polyneuropathy: Secondary | ICD-10-CM | POA: Insufficient documentation

## 2021-12-02 DIAGNOSIS — I5022 Chronic systolic (congestive) heart failure: Secondary | ICD-10-CM | POA: Insufficient documentation

## 2021-12-02 DIAGNOSIS — E1165 Type 2 diabetes mellitus with hyperglycemia: Secondary | ICD-10-CM

## 2021-12-02 DIAGNOSIS — Z809 Family history of malignant neoplasm, unspecified: Secondary | ICD-10-CM | POA: Insufficient documentation

## 2021-12-02 DIAGNOSIS — I509 Heart failure, unspecified: Secondary | ICD-10-CM | POA: Diagnosis not present

## 2021-12-02 DIAGNOSIS — E1159 Type 2 diabetes mellitus with other circulatory complications: Secondary | ICD-10-CM | POA: Diagnosis not present

## 2021-12-02 DIAGNOSIS — I952 Hypotension due to drugs: Secondary | ICD-10-CM | POA: Insufficient documentation

## 2021-12-02 DIAGNOSIS — I110 Hypertensive heart disease with heart failure: Secondary | ICD-10-CM | POA: Insufficient documentation

## 2021-12-02 DIAGNOSIS — E860 Dehydration: Secondary | ICD-10-CM | POA: Diagnosis not present

## 2021-12-02 DIAGNOSIS — Z510 Encounter for antineoplastic radiation therapy: Secondary | ICD-10-CM | POA: Diagnosis present

## 2021-12-02 DIAGNOSIS — E119 Type 2 diabetes mellitus without complications: Secondary | ICD-10-CM | POA: Diagnosis not present

## 2021-12-02 MED ORDER — ONDANSETRON HCL 8 MG PO TABS
8.0000 mg | ORAL_TABLET | Freq: Two times a day (BID) | ORAL | 1 refills | Status: DC | PRN
  Filled 2021-12-02: qty 21, 30d supply, fill #0
  Filled 2021-12-26 – 2021-12-28 (×2): qty 21, 30d supply, fill #1
  Filled 2021-12-29: qty 30, 15d supply, fill #1
  Filled 2022-01-19: qty 9, 5d supply, fill #2

## 2021-12-02 MED ORDER — PROCHLORPERAZINE MALEATE 10 MG PO TABS
10.0000 mg | ORAL_TABLET | Freq: Four times a day (QID) | ORAL | 1 refills | Status: DC | PRN
  Filled 2021-12-02: qty 30, 8d supply, fill #0

## 2021-12-02 MED ORDER — LIDOCAINE-PRILOCAINE 2.5-2.5 % EX CREA
TOPICAL_CREAM | CUTANEOUS | 3 refills | Status: DC
  Filled 2021-12-02: qty 30, 15d supply, fill #0
  Filled 2022-01-09: qty 25, 25d supply, fill #1

## 2021-12-02 NOTE — Assessment & Plan Note (Signed)
She has class III obesity with medical complications ?The calculated chemotherapy dose is very high ?As above, I plan minimum upfront dose adjustment of cisplatin ?

## 2021-12-02 NOTE — Progress Notes (Signed)
DISCONTINUE ON PATHWAY REGIMEN - Uterine ? ?No Medical Intervention - Off Treatment. ? ?REASON: Other Reason ?PRIOR TREATMENT: Off Treatment ? ?START OFF PATHWAY REGIMEN - Uterine ? ? ?Custom Intervention:Medical: [Cisplatin]: ?    Cisplatin  ? ?**Always confirm dose/schedule in your pharmacy ordering system** ? ?Patient Characteristics: ?Endometrioid, Newly Diagnosed (Clinical Staging), Nonsurgical Candidate, Stage III-IV ?Histology: Endometrioid ?Therapeutic Status: Newly Diagnosed (Clinical Staging) ?AJCC M Category: cM0 ?AJCC 8 Stage Grouping: III ?AJCC T Category: cT3 ?AJCC N Category: cN0 ? ?Intent of Therapy: ?Curative Intent, Discussed with Patient ?

## 2021-12-02 NOTE — Progress Notes (Signed)
Uterine - No Medical Intervention - Off Treatment. ? ?Patient Characteristics: ?Endometrioid, Newly Diagnosed (Clinical Staging), Nonsurgical Candidate, Stage III-IV ?Histology: Endometrioid ?Therapeutic Status: Newly Diagnosed (Clinical Staging) ?AJCC M Category: cM0 ?AJCC 8 Stage Grouping: III ?AJCC T Category: cT3 ?AJCC N Category: cN0 ? ? ?

## 2021-12-02 NOTE — Telephone Encounter (Signed)
[employer] 2021-05 - Specimen Collection Study to Evaluate Biomarkers in Subjects with Cancer  ? ?Patient was referred by MD Alvy Bimler today for study.  Contacted pt to schedule time to go over study and give her consents to review.  Pt agreed to see Research at 0900 on 4/10 after CT Sim appt.  Appt made.  Pt denies any questions/concerns at this time. ? ?Wells Guiles 'Learta Codding' Kadey Mihalic, RN, BSN ?Clinical Research Nurse I ?12/02/21 ?4:20 PM ? ?

## 2021-12-02 NOTE — Assessment & Plan Note (Signed)
She was seen and evaluated by Dr. Lindi Adie before ?This is not an active issue right now ?Observe only for now ?

## 2021-12-02 NOTE — Progress Notes (Signed)
[place] ?FOLLOW-UP progress notes ? ?Patient Care Team: ?Early, Coralee Pesa, NP as PCP - General (Nurse Practitioner) ?Jettie Booze, MD as PCP - Cardiology (Cardiology) ?Rigoberto Noel, MD as Consulting Physician (Pulmonary Disease) ?Alphonsa Overall, MD (General Surgery) ?Awanda Mink Craige Cotta, RN as Oncology Nurse Navigator (Oncology) ? ?CHIEF COMPLAINTS/PURPOSE OF VISIT:  ?Uterine cancer, for further management ? ?HISTORY OF PRESENTING ILLNESS:  ?Terri Heath 66 y.o. 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 ?I reviewed the patient's records extensive and collaborated the history with the patient. Summary of her history is as follows: ?Oncology History  ?Endometrial cancer [facility])  ?10/27/2021 Pathology Results  ? FINAL MICROSCOPIC DIAGNOSIS:  ? ?A. ENDOMETRIUM, BIOPSY:  ?- Endometrioid adenocarcinoma with focal secretory features.  ?- See comment.  ? ?COMMENT:  ?The carcinoma has endometrioid features with a few small foci with secretory features.  There are several foci of solid pattern and the findings are consistent with FIGO grade 1/2.  Immunohistochemistry shows diffuse strong positivity estrogen receptor and Napsin A is negative.  ?P53 shows wild-type pattern.  ?  ?11/07/2021 Initial Diagnosis  ? Endometrial cancer [facility]) ?  ?11/14/2021 Imaging  ? MRI pelvis ?1. Thickened (18 mm) endometrium, compatible with known endometrial malignancy. Evidence of full-thickness myometrial invasion by endometrial tumor throughout the left uterine body extending over 180 degrees of involvement.  Probable early small focus of direct tumor invasion into the parametrial soft tissues in the anterior left uterine body as detailed. ?2. Suspect a combination of direct endometrial tumor involvement of the upper endocervical canal and blood products within the endocervix. ?3. No pelvic lymphadenopathy.  No adnexal masses. ?  ?  ?  ?11/24/2021 Imaging  ? CT abdomen and pelvis ?No evidence of abdominal metastatic disease or other acute findings. ?  ?Mild hepatic steatosis. ?  ?Colonic diverticulosis, without radiographic evidence of diverticulitis. ?  ?Aortic Atherosclerosis (ICD10-I70.0). ?  ?  ?12/02/2021 Cancer Staging  ? Staging form: Corpus Uteri - Carcinoma and Carcinosarcoma, AJCC 8th Edition ?- Clinical stage from 12/02/2021: FIGO Stage IIIB (cT3b, cN0, cM0) - Signed by Heath Lark, MD on 12/02/2021 ?Stage prefix: Initial diagnosis ? ?  ?12/12/2021 -  Chemotherapy  ? Patient is on Treatment Plan : Uterine Cisplatin days 1 and 29 + XRT  ?   ? ?She denies excessive bleeding or pelvic discomfort ?She has pre-existing peripheral neuropathy affecting her toes with numbness ?The patient is attempting for aggressive dietary modification to control her weight and blood sugar in anticipation for future surgery ?She has no recent exacerbation of congestive heart failure ? ?MEDICAL HISTORY:  ?Past Medical History:  ?Diagnosis Date  ? Acute systolic heart failure ([place])   ? Allergy   ? Asthma   ? Bilateral shoulder pain   ? Borderline hypertension   ? Bronchitis   ? CAD (coronary artery disease)   ? Chronic systolic CHF (congestive heart failure) ([place])   ? Chronic systolic congestive heart failure, NYHA class 3 ([place]) 07/24/2018  ? Depression 09/15/2013  ? Last Assessment & Plan:  Patient has been reading self help material. Looking for higher energy individuals to spend time  with and no longer aloows herself to be bullied.  ? Diabetes mellitus   ? Diagnosed in 2000, on insulin, Trulicity and metformin, not checking cgs at home  ?  Gangrenous appendicitis with perforation s/p lap appendectomy 10/04/2017 10/04/2017  ? History of MI (myocardial infarction)   ? Hyperlipidemia   ? Hypertension   ? Hypothyroidism   ? MGUS (monoclonal gammopathy of unknown significance)   ? Mitral regurgitation   ? Neuropathy   ? Obesity   ? Sleep apnea   ? uses CPAP  ? Statin intolerance   ? Uterine cancer ([place])   ? ? ?SURGICAL HISTORY: ?Past Surgical History:  ?Procedure Laterality Date  ? BREATH TEK H PYLORI  09/18/2011  ? Procedure: BREATH TEK H PYLORI;  Surgeon: Shann Medal, MD;  Location: [facility];  Service: General;  Laterality: N/A;  ? CESAREAN SECTION    ? x 3  ? LAPAROSCOPIC APPENDECTOMY N/A 10/03/2017  ? Procedure: APPENDECTOMY LAPAROSCOPIC;  Surgeon: Leighton Ruff, MD;  Location: [facility];  Service: General;  Laterality: N/A;  ? RIGHT/LEFT HEART CATH AND CORONARY ANGIOGRAPHY N/A 02/12/2018  ? Procedure: RIGHT/LEFT HEART CATH AND CORONARY ANGIOGRAPHY;  Surgeon: Jettie Booze, MD;  Location: [facility];  Service: Cardiovascular;  Laterality: N/A;  ? TUBAL LIGATION    ? ? ?SOCIAL HISTORY: ?Social History  ? ?Socioeconomic History  ? Marital status: Married  ?  Spouse name: Not on file  ? Number of children: Not on file  ? Years of education: Not on file  ? Highest education level: Not on file  ?Occupational History  ? Occupation: retired Brewing technologist, worked at [employer]  ?Tobacco Use  ? Smoking status: Former  ?  Packs/day: 1.00  ?  Years: 15.00  ?  Pack years: 15.00  ?  Types: Cigarettes  ?  Quit date: 01/28/1990  ?  Years since quitting: 31.8  ? Smokeless tobacco: Never  ?Vaping Use  ? Vaping Use: Never used  ?Substance and Sexual Activity  ? Alcohol use: Yes  ?  Comment: occasional  ? Drug use: No  ? Sexual activity: Not Currently  ?  Birth control/protection: Post-menopausal, Abstinence  ?Other Topics Concern  ? Not on file  ?Social History Narrative  ? Lives in [place]  ? 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 ?Social Determinants  of Health  ? ?Financial Resource Strain: Not on file  ?Food Insecurity: Not on file  ?Transportation Needs: Not on file  ?Physical Activity: Inactive  ? Days of Exercise per Week: 0 days  ? Minutes of Exercise per Session: 0 min  ?Stress: Not on file  ?Social Connections: Not on file  ?Intimate Partner Violence: Not on file  ? ? ?FAMILY HISTORY: ?Family History  ?Problem Relation Age of Onset  ? Alcohol abuse Mother   ? Arthritis Mother   ? Diabetes Mother   ? Sleep apnea Mother   ? Anxiety disorder Mother   ? Eating disorder Mother   ? Obesity Mother   ? Cancer Father   ? Alcohol abuse Father   ? Heart disease Father   ? Hypertension Father   ? Stroke Father   ? Sleep apnea Sister   ? Breast cancer Neg Hx   ? Colon cancer Neg Hx   ? Ovarian cancer Neg Hx   ? Endometrial cancer Neg Hx   ? Pancreatic cancer Neg Hx   ? Prostate cancer Neg Hx   ? ? ?  ALLERGIES:  is allergic to amoxicillin, adhesive [tape], exenatide, invokana [canagliflozin], jardiance [empagliflozin], lisinopril, other, and statins. ? ?MEDICATIONS:  ?Current Outpatient Medications  ?Medication Sig Dispense Refill  ? acetaminophen (TYLENOL) 500 MG tablet Take 500 mg by mouth every 6 (six) hours as needed for moderate pain.    ? albuterol (PROAIR HFA) 108 (90 Base) MCG/ACT inhaler Inhale 2 puffs into the lungs every 6 (six) hours as needed. wheezing 18 g 2  ? aspirin 81 MG chewable tablet Chew 1 tablet (81 mg total) by mouth daily. 90 tablet 3  ? bismuth subsalicylate (PEPTO BISMOL) 262 MG chewable tablet Chew 524 mg by mouth as needed for indigestion or diarrhea or loose stools.     ? blood glucose meter kit and supplies Use up to four times daily as directed. (FOR ICD-10 E10.9, E11.9). 1 each 99  ? carvedilol (COREG) 12.5 MG tablet Take 1 tablet (12.5 mg total) by mouth 2 (two) times daily. 180 tablet 3  ? diphenhydrAMINE (BENADRYL) 25 MG tablet Take 25 mg by mouth every 6 (six) hours as needed for allergies.    ? diphenoxylate-atropine (LOMOTIL)  2.5-0.025 MG tablet Take 1 tablet by mouth 4 (four) times daily as needed for diarrhea or loose stools. 30 tablet 0  ? Dulaglutide (TRULICITY) 3 QL/7.3PV SOPN Inject 3 mg into the skin once a week. 6 mL 3  ? Ev

## 2021-12-02 NOTE — Assessment & Plan Note (Signed)
She has no clinical signs of active congestive heart failure ?She will continue medical management ?

## 2021-12-02 NOTE — Assessment & Plan Note (Signed)
She has pre-existing peripheral neuropathy ?I plan mild reduction of cisplatin dose to 40 mg per metered square ?

## 2021-12-02 NOTE — Progress Notes (Signed)
[facility] CSW Progress Note ? ?Clinical Social Worker contacted patient by phone to discuss financial concerns.  Pt will be eligible for Medicare coverage on June first.  At present pt is unsure what her financial responsibility will be with her current insurance.  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  CSW provided pt w/ contact information for patient financial resources to explore co-pay programs pt's may be eligible for as well as the [employer] if pt qualifies.  Pt provided w/ contact information for CSW as well if there are financial concerns beyond what pt may qualify for through patient financial resources.  CSW to remain available to address future concerns as they arise. ? ? ? ?Henriette Combs , LCSW ?

## 2021-12-02 NOTE — Assessment & Plan Note (Signed)
The patient will be at risk of uncontrolled diabetes for for few days after chemotherapy ?We discussed the importance of close monitoring of her blood sugar and possibly additional insulin as needed ?She is aware of the importance of dietary modification while on treatment ?

## 2021-12-02 NOTE — Assessment & Plan Note (Signed)
I have reviewed her imaging study ?The patient has advanced disease, MRI showed full-thickness myometrial invasion with possible parametrial soft tissue involvement ?Due to her significant obesity and poorly controlled diabetes and history of heart failure, the patient is deemed a high risk candidate for upfront surgery ?We discussed neoadjuvant chemotherapy with radiation approach with cisplatin as radiosensitizing agent followed by repeat imaging study, followed by decision whether she would proceed with surgery versus 4 cycles of carboplatin and paclitaxel ? ?We discussed the role of concurrent chemotherapy with radiation followed by chemotherapy for treatment approach of endometrial cancer ? ?We reviewed the NCCN guidelines using multimodality treatment ? ?The decision is based on publication on [id number] study.  ? ?We discussed the role of chemotherapy. The intent is of curative intent. ? ?We discussed some of the risks, benefits, side-effects of cisplatin on days 1 and 28 with radiation followed by 4 cycles of carboplatin & Taxol (initial dose of carboplatin at AUC of 5, to be escalated to AUC of 6 for rest of cycles if her blood count recovers). Treatment is intravenous ? ?Some of the short term side-effects included, though not limited to, including weight loss, life threatening infections, risk of allergic reactions, renal failure, need for transfusions of blood products, nausea, vomiting, change in bowel habits, loss of hair, admission to hospital for various reasons, and risks of death.  ? ?Long term side-effects are also discussed including risks of infertility, permanent damage to nerve function, hearing loss, chronic fatigue, kidney damage with possibility needing hemodialysis, and rare secondary malignancy including bone marrow disorders. ? ?The patient is aware that the response rates discussed earlier is not guaranteed.  After a long discussion, patient made an informed decision to proceed with the  prescribed plan of care.  ? ?Patient education material was dispensed. ?I recommend port placement and chemo education class next week ?I will tentatively get her scheduled for chemotherapy to start within the next 2 weeks ?We also discussed referral to research trial and she appears interested ?I will work around her radiation schedule and I will see her the week after chemotherapy ?

## 2021-12-05 ENCOUNTER — Inpatient Hospital Stay: Payer: 59 | Admitting: Emergency Medicine

## 2021-12-05 ENCOUNTER — Ambulatory Visit
Admission: RE | Admit: 2021-12-05 | Discharge: 2021-12-05 | Disposition: A | Discharge status: Home / Self Care | Payer: 59 | Source: Ambulatory Visit | Attending: Radiation Oncology | Admitting: Radiation Oncology

## 2021-12-05 ENCOUNTER — Inpatient Hospital Stay: Payer: 59

## 2021-12-05 ENCOUNTER — Other Ambulatory Visit (HOSPITAL_COMMUNITY): Payer: Self-pay

## 2021-12-05 ENCOUNTER — Encounter: Payer: Self-pay | Admitting: Emergency Medicine

## 2021-12-05 ENCOUNTER — Other Ambulatory Visit: Payer: Self-pay

## 2021-12-05 ENCOUNTER — Other Ambulatory Visit: Payer: Self-pay | Admitting: Emergency Medicine

## 2021-12-05 DIAGNOSIS — C541 Malignant neoplasm of endometrium: Secondary | ICD-10-CM

## 2021-12-05 DIAGNOSIS — Z5111 Encounter for antineoplastic chemotherapy: Secondary | ICD-10-CM | POA: Insufficient documentation

## 2021-12-05 DIAGNOSIS — D472 Monoclonal gammopathy: Secondary | ICD-10-CM | POA: Insufficient documentation

## 2021-12-05 DIAGNOSIS — Z87891 Personal history of nicotine dependence: Secondary | ICD-10-CM | POA: Insufficient documentation

## 2021-12-05 DIAGNOSIS — E669 Obesity, unspecified: Secondary | ICD-10-CM | POA: Insufficient documentation

## 2021-12-05 DIAGNOSIS — I509 Heart failure, unspecified: Secondary | ICD-10-CM | POA: Insufficient documentation

## 2021-12-05 DIAGNOSIS — Z510 Encounter for antineoplastic radiation therapy: Secondary | ICD-10-CM | POA: Diagnosis not present

## 2021-12-05 DIAGNOSIS — E860 Dehydration: Secondary | ICD-10-CM | POA: Insufficient documentation

## 2021-12-05 DIAGNOSIS — E119 Type 2 diabetes mellitus without complications: Secondary | ICD-10-CM | POA: Insufficient documentation

## 2021-12-05 DIAGNOSIS — R7989 Other specified abnormal findings of blood chemistry: Secondary | ICD-10-CM | POA: Insufficient documentation

## 2021-12-05 DIAGNOSIS — Z809 Family history of malignant neoplasm, unspecified: Secondary | ICD-10-CM | POA: Insufficient documentation

## 2021-12-05 DIAGNOSIS — I110 Hypertensive heart disease with heart failure: Secondary | ICD-10-CM | POA: Insufficient documentation

## 2021-12-05 DIAGNOSIS — I952 Hypotension due to drugs: Secondary | ICD-10-CM | POA: Insufficient documentation

## 2021-12-05 LAB — BASIC METABOLIC PANEL - CANCER CENTER ONLY
Anion gap: 7 (ref 5–15)
BUN: 18 mg/dL (ref 8–23)
CO2: 29 mmol/L (ref 22–32)
Calcium: 9.1 mg/dL (ref 8.9–10.3)
Chloride: 101 mmol/L (ref 98–111)
Creatinine: 0.99 mg/dL (ref 0.44–1.00)
GFR, Estimated: >60 mL/min (ref >60)
Glucose, Bld: 195 mg/dL — ABNORMAL HIGH (ref 70–99)
Potassium: 4.1 mmol/L (ref 3.5–5.1)
Sodium: 137 mmol/L (ref 135–145)

## 2021-12-05 LAB — CBC WITH DIFFERENTIAL (CANCER CENTER ONLY)
Abs Immature Granulocytes: 0.01 10*3/uL (ref 0.00–0.07)
Basophils Absolute: 0 10*3/uL (ref 0.0–0.1)
Basophils Relative: 1 %
Eosinophils Absolute: 0.2 10*3/uL (ref 0.0–0.5)
Eosinophils Relative: 3 %
HCT: 39.3 % (ref 36.0–46.0)
Hemoglobin: 13.3 g/dL (ref 12.0–15.0)
Immature Granulocytes: 0 %
Lymphocytes Relative: 35 %
Lymphs Abs: 2.4 10*3/uL (ref 0.7–4.0)
MCH: 28.7 pg (ref 26.0–34.0)
MCHC: 33.8 g/dL (ref 30.0–36.0)
MCV: 84.7 fL (ref 80.0–100.0)
Monocytes Absolute: 0.6 10*3/uL (ref 0.1–1.0)
Monocytes Relative: 8 %
Neutro Abs: 3.6 10*3/uL (ref 1.7–7.7)
Neutrophils Relative %: 53 %
Platelet Count: 292 10*3/uL (ref 150–400)
RBC: 4.64 MIL/uL (ref 3.87–5.11)
RDW: 13.1 % (ref 11.5–15.5)
WBC Count: 6.8 10*3/uL (ref 4.0–10.5)
nRBC: 0 % (ref 0.0–0.2)

## 2021-12-05 LAB — RESEARCH LABS

## 2021-12-05 LAB — MAGNESIUM: Magnesium: 1.7 mg/dL (ref 1.7–2.4)

## 2021-12-05 NOTE — Research (Signed)
[employer] 2021-05 - Specimen Collection Study to Evaluate Biomarkers in Subjects with Cancer   ?This Nurse has reviewed this patient's inclusion and exclusion criteria as a second review and confirms Terri Heath is eligible for study participation.  Patient may continue with enrollment.  ?Foye Spurling, BSN, RN, CCRP ?Clinical Research Nurse II ?12/05/2021 9:51 AM ? ?

## 2021-12-05 NOTE — Research (Signed)
[employer] 2021-05 - Specimen Collection Study to Evaluate Biomarkers in Subjects with Cancer  ? ?ELIGIBILITY CHECK- Treatment Naive Cohort ? ?This Nurse has reviewed this patient's inclusion and exclusion criteria and confirmed Terri Heath is eligible for study participation.  Patient will continue with enrollment. ? ?Eligibility confirmed by treating investigator, who also agrees that patient should proceed with enrollment and that patient does not have any condition that would interfere with any consent/study requirements. ? ?Confirmed the following verbally with patient today (12/05/21): ?-Patient states she is able to provider her own informed consent ?-Patient understands and is willing and able to comply with all study procedures ?-Patient has not been/is not currently enrolled in another [facility] sample collection study or another cohort within this study ? ?Reviewed exclusion criteria for all subjects as well as treatment naive cohort, patient verbally denied all. ? ?Reviewed medication and allergy lists with patient, confirmed no changes. ? ?Wells Guiles 'Learta Codding' Tramon Crescenzo, RN, BSN ?Clinical Research Nurse I ?12/05/21 ?10:15 AM ? ?

## 2021-12-05 NOTE — Research (Signed)
[employer] 2021-05 - Specimen Collection Study to Evaluate Biomarkers in Subjects with Cancer  ? ?Blood drawn same day as consent (12/05/21), pt tolerated well.  Gift card given to patient and release form signed. ? ?Study ID# [id number]. ? ?Wells Guiles 'Learta Codding' Neysa Bonito, RN, BSN ?Clinical Research Nurse I ?12/05/21 ?3:34 PM ? ?

## 2021-12-05 NOTE — Research (Signed)
[employer] 2021-05 - Specimen Collection Study to Evaluate Biomarkers in Subjects with Cancer  ? ?INTRO STUDY/CONSENTS AND CONSENT SIGNATURE ? ?Patient Terri Heath was identified by MD Alvy Bimler as a potential candidate for the above listed study.  This Clinical Research Nurse met with BUSHRA DENMAN, [id number], on 12/05/21 in a manner and location that ensures patient privacy to discuss participation in the above listed research study.  Patient is Unaccompanied.  A copy of the informed consent document with embedded HIPAA language was provided to the patient.  Patient reads, speaks, and understands [place].   ? ?Patient was provided with the business card of this Nurse and encouraged to contact the research team with any questions.  Patient was provided the option of taking informed consent documents home to review and was encouraged to review at their convenience with their support network, including other care providers. Patient is comfortable with making a decision regarding study participation today. ? ?As outlined in the informed consent form, this Nurse and Arlie Solomons discussed the purpose of the research study, the investigational nature of the study, study procedures and requirements for study participation, potential risks and benefits of study participation, as well as alternatives to participation. This study is not double blinded. The patient understands participation is voluntary and they may withdraw from study participation at any time.  This study does not involve randomization.  This study does not involve an investigational drug or device. This study does not involve a placebo. Patient understands enrollment is pending full eligibility review.  ? ?Confidentiality and how the patient's information will be used as part of study participation were discussed.  Patient was informed there is reimbursement provided for their time and effort spent on trial participation.  The patient  is encouraged to discuss research study participation with their insurance provider to determine what costs they may incur as part of study participation, including research related injury.   ? ?All questions were answered to patient's satisfaction.  The informed consent with embedded HIPAA language was reviewed page by page.  The patient's mental and emotional status is appropriate to provide informed consent, and the patient verbalizes an understanding of study participation.  Patient has agreed to participate in the above listed research study and has voluntarily signed the informed consent version date 09/10/2020 (revised 09/26/2021) with embedded HIPAA language, version date 09/10/2020 (revised 09/26/2021)  on 12/05/21 at 0845 AM.  The patient was provided with a copy of the signed informed consent form with embedded HIPAA language for their reference.  No study specific procedures were obtained prior to the signing of the informed consent document.  Approximately 25 minutes were spent with the patient reviewing the informed consent documents.  Patient was not requested to complete a Release of Information form. ? ?Wells Guiles 'Learta Codding' Wake Conlee, RN, BSN ?Clinical Research Nurse I ?12/05/21 ?9:17 AM ? ? ?

## 2021-12-05 NOTE — Research (Signed)
[employer] 2021-05 - Specimen Collection Study to Evaluate Biomarkers in Subjects with Cancer ? ?HISTORY WORKSHEET ? ?This patient reports no changes to medical history reported in record. This patient is taking magnesium supplements.  The patient does report family history of cancer in 1st or 2nd degree relatives.  Her father has a medically unconfirmed history of cancer, however patient suspects he had lung cancer d/t spot found on chest scan before his passing.  The patient reports no changes to social history reported in record. ? ?Wells Guiles 'Learta Codding' Seab Axel, RN, BSN ?Clinical Research Nurse I ?12/05/21 ?1:08 PM ? ?

## 2021-12-06 ENCOUNTER — Other Ambulatory Visit (HOSPITAL_COMMUNITY): Payer: Self-pay | Admitting: Physician Assistant

## 2021-12-06 NOTE — H&P (Addendum)
Chief Complaint: Patient was seen in consultation today for tunneled catheter with port placement at the request of Artis Delay, MD  Referring Physician(s): Artis Delay, MD  Supervising Physician: Gilmer Mor  Patient Status: [facility] - Out-pt  History of Present Illness: Terri Heath is a 66 y.o. female w/ PMH of acute systolic heart failure, CAD, DM, MI, HLD, HTN, MGUS, mitral regurgitation, OSA on CPAP and uterine cancer. Pt was diagnosed with endometrial cancer 11/07/21. Pt referred to IR by Dr. Artis Delay for tunneled cathter with port placement for chemotherapy.   Past Medical History:  Diagnosis Date   Acute systolic heart failure (HCC)    Allergy    Asthma    Bilateral shoulder pain    Borderline hypertension    Bronchitis    CAD (coronary artery disease)    Chronic systolic CHF (congestive heart failure) (HCC)    Chronic systolic congestive heart failure, NYHA class 3 (HCC) 07/24/2018   Depression 09/15/2013   Last Assessment & Plan:  Patient has been reading self help material. Looking for higher energy individuals to spend time with and no longer aloows herself to be bullied.   Diabetes mellitus    Diagnosed in 2000, on insulin, Trulicity and metformin, not checking cgs at home   Gangrenous appendicitis with perforation s/p lap appendectomy 10/04/2017 10/04/2017   History of MI (myocardial infarction)    Hyperlipidemia    Hypertension    Hypothyroidism    MGUS (monoclonal gammopathy of unknown significance)    Mitral regurgitation    Neuropathy    Obesity    Sleep apnea    uses CPAP   Statin intolerance    Uterine cancer [facility])     Past Surgical History:  Procedure Laterality Date   BREATH TEK H PYLORI  09/18/2011   Procedure: BREATH TEK H PYLORI;  Surgeon: Kandis Cocking, MD;  Location: [facility];  Service: General;  Laterality: N/A;   CESAREAN SECTION     x 3   LAPAROSCOPIC APPENDECTOMY N/A 10/03/2017   Procedure: APPENDECTOMY LAPAROSCOPIC;  Surgeon:  Romie Levee, MD;  Location: [facility] ORS;  Service: General;  Laterality: N/A;   RIGHT/LEFT HEART CATH AND CORONARY ANGIOGRAPHY N/A 02/12/2018   Procedure: RIGHT/LEFT HEART CATH AND CORONARY ANGIOGRAPHY;  Surgeon: Corky Crafts, MD;  Location: [facility] INVASIVE CV LAB;  Service: Cardiovascular;  Laterality: N/A;   TUBAL LIGATION      Allergies: Amoxicillin, Adhesive [tape], Exenatide, Invokana [canagliflozin], Jardiance [empagliflozin], Lisinopril, Other, and Statins  Medications: Prior to Admission medications   Medication Sig Start Date End Date Taking? Authorizing Provider  acetaminophen (TYLENOL) 500 MG tablet Take 500 mg by mouth every 6 (six) hours as needed for moderate pain.    [provider]  albuterol (PROAIR HFA) 108 (90 Base) MCG/ACT inhaler Inhale 2 puffs into the lungs every 6 (six) hours as needed. wheezing 02/21/19   Oretha Milch, MD  aspirin 81 MG chewable tablet Chew 1 tablet (81 mg total) by mouth daily. 09/30/21   Laurey Morale, MD  bismuth subsalicylate (PEPTO BISMOL) 262 MG chewable tablet Chew 524 mg by mouth as needed for indigestion or diarrhea or loose stools.     [provider]  blood glucose meter kit and supplies Use up to four times daily as directed. (FOR ICD-10 E10.9, E11.9). 01/21/21   Tollie Eth, NP  carvedilol (COREG) 12.5 MG tablet Take 1 tablet (12.5 mg total) by mouth 2 (two) times daily. 04/26/21  Laurey Morale, MD  diphenhydrAMINE (BENADRYL) 25 MG tablet Take 25 mg by mouth every 6 (six) hours as needed for allergies.    [provider]  diphenoxylate-atropine (LOMOTIL) 2.5-0.025 MG tablet Take 1 tablet by mouth 4 (four) times daily as needed for diarrhea or loose stools. 01/15/20   Lamptey, Britta Mccreedy, MD  Dulaglutide (TRULICITY) 3 MG/0.5ML SOPN Inject 3 mg into the skin once a week. 06/09/21   Tollie Eth, NP  Evolocumab (REPATHA SURECLICK) 140 MG/ML SOAJ Inject 1 pen into the skin every 14 (fourteen) days. 06/23/21    Laurey Morale, MD  furosemide (LASIX) 40 MG tablet Take 1 tablet (40 mg total) by mouth 2 (two) times daily. 07/04/21   Laurey Morale, MD  glucose blood test strip Use as directed to test blood sugar 01/21/21   Early, Sung Amabile, NP  insulin glargine-yfgn (SEMGLEE) 100 UNIT/ML Pen Inject 25-40 Units into the skin daily based on blood sugar levels via sliding scale. 06/09/21   Tollie Eth, NP  Insulin Pen Needle (UNIFINE PENTIPS) 32G X 4 MM MISC USE AS DIRECTED ONCE A DAY 11/02/21 11/02/22  Tollie Eth, NP  levothyroxine (SYNTHROID) 50 MCG tablet Take 1 tablet (50 mcg total) by mouth daily before breakfast. 11/02/21   Early, Sung Amabile, NP  lidocaine-prilocaine (EMLA) cream Apply to affected area once 12/02/21   Artis Delay, MD  magnesium oxide (MAG-OX) 400 MG tablet Take 400 mg by mouth at bedtime as needed (for cramps).    [provider]  metFORMIN (GLUCOPHAGE) 1000 MG tablet Take 1 tablet (1,000 mg total) by mouth 2 (two) times daily with a meal. 06/09/21 06/09/22  Early, Sung Amabile, NP  Multiple Vitamin (MULITIVITAMIN WITH MINERALS) TABS Take 1 tablet by mouth daily with breakfast.    [provider]  ondansetron (ZOFRAN) 8 MG tablet Take 1 tablet (8 mg total) by mouth 2 (two) times daily as needed. Start on the third day after chemotherapy. 12/02/21   Artis Delay, MD  OneTouch Delica Lancets 33G MISC use as directed 01/21/21   Early, Sung Amabile, NP  prochlorperazine (COMPAZINE) 10 MG tablet Take 1 tablet (10 mg total) by mouth every 6 (six) hours as needed (Nausea or vomiting). 12/02/21   Artis Delay, MD  sacubitril-valsartan (ENTRESTO) 49-51 MG TAKE 1 TABLET BY MOUTH 2 (TWO) TIMES DAILY. 06/15/21 06/15/22  Laurey Morale, MD  spironolactone (ALDACTONE) 25 MG tablet TAKE 1 TABLET (25 MG TOTAL) BY MOUTH DAILY. 11/01/21 11/01/22  Laurey Morale, MD  triamcinolone ointment (KENALOG) 0.1 % Apply 1 application topically two times daily to affected area(s) as needed, sparing use to avoid  whitening/thinning skin 01/06/21   Early, Sung Amabile, NP  insulin detemir (LEVEMIR) 100 UNIT/ML FlexPen Inject 10 Units into the skin daily. 09/20/20 10/26/20  Just, Azalee Course, FNP     Family History  Problem Relation Age of Onset   Alcohol abuse Mother    Arthritis Mother    Diabetes Mother    Sleep apnea Mother    Anxiety disorder Mother    Eating disorder Mother    Obesity Mother    Cancer Father    Alcohol abuse Father    Heart disease Father    Hypertension Father    Stroke Father    Sleep apnea Sister    Breast cancer Neg Hx    Colon cancer Neg Hx    Ovarian cancer Neg Hx    Endometrial cancer Neg Hx  Pancreatic cancer Neg Hx    Prostate cancer Neg Hx     Social History   Socioeconomic History   Marital status: Married    Spouse name: Not on file   Number of children: Not on file   Years of education: Not on file   Highest education level: Not on file  Occupational History   Occupation: retired - EKG tech, worked at [employer]  Tobacco Use   Smoking status: Former    Packs/day: 1.00    Years: 15.00    Pack years: 15.00    Types: Cigarettes    Quit date: 01/28/1990    Years since quitting: 31.8   Smokeless tobacco: Never  Vaping Use   Vaping Use: Never used  Substance and Sexual Activity   Alcohol use: Yes    Comment: occasional   Drug use: No   Sexual activity: Not Currently    Birth control/protection: Post-menopausal, Abstinence  Other Topics Concern   Not on file  Social History Narrative   Lives in [place]   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   All other systems are negative.  Review of Systems  Constitutional:  Negative for chills and fever.  Eyes:  Negative for visual disturbance.  Respiratory:  Negative for cough and shortness of breath.   Cardiovascular:  Negative for chest pain and leg swelling.  Gastrointestinal:  Negative for abdominal pain and nausea.  Genitourinary:  Positive for vaginal bleeding.  Neurological:  Negative for dizziness, light-headedness and headaches.   Vital Signs: There were no vitals taken for this visit.  Physical Exam Constitutional:      General: She is not in acute distress.    Appearance: Normal appearance. She is not ill-appearing.  HENT:     Head: Normocephalic and atraumatic.     Mouth/Throat:     Mouth: Mucous membranes are dry.     Pharynx: Oropharynx is clear.  Eyes:     Extraocular Movements: Extraocular movements intact.     Pupils: Pupils are equal, round, and reactive to light.  Cardiovascular:     Rate and Rhythm: Normal rate and regular rhythm.     Pulses: Normal pulses.     Heart sounds: Normal heart sounds.  Pulmonary:     Effort: Pulmonary effort is normal. No respiratory distress.     Breath sounds: Normal breath sounds.  Abdominal:     General: Bowel sounds are normal. There is no distension.     Palpations: Abdomen is soft.     Tenderness: There is no abdominal tenderness. There is no guarding.  Musculoskeletal:     Right lower leg: No edema.     Left lower leg: No edema.  Skin:    General: Skin is warm and dry.  Neurological:     Mental Status: She is alert and oriented to person, place, and time.  Psychiatric:        Mood and Affect: Mood normal.        Behavior: Behavior normal.  Thought Content: Thought content normal.        Judgment: Judgment normal.    Imaging: CT ABDOMEN W CONTRAST  Result Date: 11/28/2021 CLINICAL DATA:  Endometrial carcinoma.  Staging. EXAM: CT ABDOMEN WITH CONTRAST TECHNIQUE: Multidetector CT imaging of the abdomen was  performed using the standard protocol following bolus administration of intravenous contrast. RADIATION DOSE REDUCTION: This exam was performed according to the departmental dose-optimization program which includes automated exposure control, adjustment of the mA and/or kV according to patient size and/or use of iterative reconstruction technique. CONTRAST:  OMNIPAQUE IOHEXOL 300 MG/ML  SOLN COMPARISON:  Pelvic MRI on 11/14/2021 FINDINGS: Lower chest: No acute findings. Hepatobiliary: No hepatic masses identified. Mild diffuse hepatic steatosis. Gallbladder is unremarkable. No evidence of biliary ductal dilatation. Pancreas:  No mass or inflammatory changes. Spleen:  Within normal limits in size and appearance. Adrenals/Urinary Tract: No masses identified. No evidence of hydronephrosis. Stomach/Bowel: No acute findings. Colonic diverticulosis is noted, without signs of diverticulitis within the abdomen. Vascular/Lymphatic: No pathologically enlarged lymph nodes identified. No acute vascular findings. Aortic atherosclerotic calcification noted. Other:  None. Musculoskeletal:  No suspicious bone lesions identified. IMPRESSION: No evidence of abdominal metastatic disease or other acute findings. Mild hepatic steatosis. Colonic diverticulosis, without radiographic evidence of diverticulitis. Aortic Atherosclerosis (ICD10-I70.0). Electronically Signed   By: Danae Orleans M.D.   On: 11/28/2021 13:14   MR Pelvis W Wo Contrast  Result Date: 11/15/2021 CLINICAL DATA:  66 year old female with postmenopausal bleeding and endometrial cancer. Staging. EXAM: MRI PELVIS WITHOUT AND WITH CONTRAST TECHNIQUE: Multiplanar multisequence MR imaging of the pelvis was performed both before and after administration of intravenous contrast. CONTRAST:  10mL GADAVIST GADOBUTROL 1 MMOL/ML IV SOLN COMPARISON:  10/26/2021 pelvic sonogram. 10/03/2017 CT abdomen/pelvis. FINDINGS: Urinary Tract:  Normal nondistended bladder.  Normal  urethra. Bowel: Visualized small and large bowel are normal caliber with no bowel wall thickening. Vascular/Lymphatic: No pathologically enlarged lymph nodes in the pelvis. No acute vascular abnormality. Reproductive: Uterus: The anteverted uterus measures 9.0 x 3.9 x 5.0 cm. No uterine fibroids. Endometrium is abnormally thickened, measuring 18 mm in bilayer thickness. There is evidence of full-thickness myometrial invasion by tumor throughout the left uterine body extending over 180 degrees of involvement (series 5/images 20-21). Slight bulging and irregularity of the left anterior uterine body contour (series 5/image 22 and series 4/image 24) suggests early invasion into the parametrial soft tissues in this location. There is expansion of the endocervical canal with mixed intensity material with questionable mild heterogeneous enhancement (series 10903/image 54), suspect a combination of direct endometrial tumor involvement of the upper endocervical canal and blood products within the endocervix. Cervical fibrous stroma is intact. Normal vagina. Ovaries and Adnexa: Symmetric atrophic postmenopausal ovaries bilaterally. There are no ovarian or adnexal masses. Other: No abnormal free fluid in the pelvis. No focal pelvic fluid collection. No evidence of hydronephrosis in the visualized kidneys on the coronal sequence. Musculoskeletal: No aggressive appearing focal osseous lesions. IMPRESSION: 1. Thickened (18 mm) endometrium, compatible with known endometrial malignancy. Evidence of full-thickness myometrial invasion by endometrial tumor throughout the left uterine body extending over 180 degrees of involvement. Probable early small focus of direct tumor invasion into the parametrial soft tissues in the anterior left uterine body as detailed. 2. Suspect a combination of direct endometrial tumor involvement of the upper endocervical canal and blood products within the endocervix. 3. No pelvic lymphadenopathy.  No  adnexal masses. Electronically Signed   By: Delbert Phenix M.D.   On: 11/15/2021  13:35    Labs:  CBC: Recent Labs    12/05/21 1024  WBC 6.8  HGB 13.3  HCT 39.3  PLT 292    COAGS: No results for input(s): INR, APTT in the last 8760 hours.  BMP: Recent Labs    01/06/21 1428 06/23/21 1016 11/24/21 1131 12/05/21 1024  NA 136 135 136 137  K 4.2 4.3 4.2 4.1  CL 96* 98 100 101  CO2 31 28 26 29   GLUCOSE 312* 312* 163* 195*  BUN 26* 16 21 18   CALCIUM 9.7 9.1 9.7 9.1  CREATININE 0.96 0.98 0.93 0.99  GFRNONAA >60 >60 >60 >60    LIVER FUNCTION TESTS: Recent Labs    01/06/21 1428  BILITOT 0.3  AST 12*  ALT 14  ALKPHOS 77  PROT 8.0  ALBUMIN 3.9    TUMOR MARKERS: No results for input(s): AFPTM, CEA, CA199, CHROMGRNA in the last 8760 hours.  Assessment and Plan: History of acute systolic heart failure, CAD, DM, MI, HLD, HTN, MGUS, mitral regurgitation, OSA on CPAP and uterine cancer. Pt was diagnosed with endometrial cancer 11/07/21. Pt referred to IR by Dr. Artis Delay for tunneled cathter with port placement for chemotherapy.   Pt resting on stretcher. She is A&O, calm and pleasant. She is in no distress.  Pt states she is NPO per order.  She takes ASA 81 mg daily.  No labs required today.   Risks and benefits of image guided tunneled catheter with port placement was discussed with the patient including, but not limited to bleeding, infection, pneumothorax, or fibrin sheath development and need for additional procedures.  All of the patient's questions were answered, patient is agreeable to proceed. Consent signed and in chart.   Thank you for this interesting consult.  I greatly enjoyed meeting Terri Heath and look forward to participating in their care.  A copy of this report was sent to the requesting provider on this date.  Electronically Signed: Shon Hough, NP 12/07/2021, 10:39 AM   I spent a total of 20 minutes in face to face in clinical  consultation, greater than 50% of which was counseling/coordinating care for tunneled catheter with port placement.

## 2021-12-06 NOTE — Progress Notes (Signed)
Pharmacist Chemotherapy Monitoring - Initial Assessment   ? ?Anticipated start date: 12/13/21  ? ?The following has been reviewed per standard work regarding the patient's treatment regimen: ?The patient's diagnosis, treatment plan and drug doses, and organ/hematologic function ?Lab orders and baseline tests specific to treatment regimen  ?The treatment plan start date, drug sequencing, and pre-medications ?Prior authorization status  ?Patient's documented medication list, including drug-drug interaction screen and prescriptions for anti-emetics and supportive care specific to the treatment regimen ?The drug concentrations, fluid compatibility, administration routes, and timing of the medications to be used ?The patient's access for treatment and lifetime cumulative dose history, if applicable  ?The patient's medication allergies and previous infusion related reactions, if applicable  ? ?Changes made to treatment plan:  ?N/A ? ?Follow up needed:  ?Prior authorization  ? ? Larene Beach [facility], ?12/06/2021  10:52 AM ? ?

## 2021-12-07 ENCOUNTER — Other Ambulatory Visit (HOSPITAL_COMMUNITY): Payer: Self-pay

## 2021-12-07 ENCOUNTER — Ambulatory Visit (HOSPITAL_COMMUNITY)
Admission: RE | Admit: 2021-12-07 | Discharge: 2021-12-07 | Disposition: A | Discharge status: Home / Self Care | Payer: 59 | Source: Ambulatory Visit | Attending: Hematology and Oncology | Admitting: Hematology and Oncology

## 2021-12-07 ENCOUNTER — Encounter (HOSPITAL_COMMUNITY): Payer: Self-pay

## 2021-12-07 DIAGNOSIS — I2510 Atherosclerotic heart disease of native coronary artery without angina pectoris: Secondary | ICD-10-CM | POA: Insufficient documentation

## 2021-12-07 DIAGNOSIS — C541 Malignant neoplasm of endometrium: Secondary | ICD-10-CM | POA: Diagnosis present

## 2021-12-07 DIAGNOSIS — E785 Hyperlipidemia, unspecified: Secondary | ICD-10-CM | POA: Diagnosis not present

## 2021-12-07 DIAGNOSIS — Z7982 Long term (current) use of aspirin: Secondary | ICD-10-CM | POA: Insufficient documentation

## 2021-12-07 DIAGNOSIS — G4733 Obstructive sleep apnea (adult) (pediatric): Secondary | ICD-10-CM | POA: Diagnosis not present

## 2021-12-07 DIAGNOSIS — Z87891 Personal history of nicotine dependence: Secondary | ICD-10-CM | POA: Insufficient documentation

## 2021-12-07 DIAGNOSIS — Z794 Long term (current) use of insulin: Secondary | ICD-10-CM | POA: Insufficient documentation

## 2021-12-07 DIAGNOSIS — Z7985 Long-term (current) use of injectable non-insulin antidiabetic drugs: Secondary | ICD-10-CM | POA: Insufficient documentation

## 2021-12-07 DIAGNOSIS — I110 Hypertensive heart disease with heart failure: Secondary | ICD-10-CM | POA: Diagnosis not present

## 2021-12-07 DIAGNOSIS — I5021 Acute systolic (congestive) heart failure: Secondary | ICD-10-CM | POA: Insufficient documentation

## 2021-12-07 DIAGNOSIS — I252 Old myocardial infarction: Secondary | ICD-10-CM | POA: Diagnosis not present

## 2021-12-07 DIAGNOSIS — I340 Nonrheumatic mitral (valve) insufficiency: Secondary | ICD-10-CM | POA: Insufficient documentation

## 2021-12-07 DIAGNOSIS — E119 Type 2 diabetes mellitus without complications: Secondary | ICD-10-CM | POA: Insufficient documentation

## 2021-12-07 HISTORY — PX: IR IMAGING GUIDED PORT INSERTION: IMG5740

## 2021-12-07 LAB — GLUCOSE, CAPILLARY: Glucose-Capillary: 189 mg/dL — ABNORMAL HIGH (ref 70–99)

## 2021-12-07 MED ORDER — SODIUM CHLORIDE 0.9 % IV SOLN
INTRAVENOUS | Status: DC
Start: 2021-12-07 — End: 2021-12-08

## 2021-12-07 MED ORDER — FENTANYL CITRATE (PF) 100 MCG/2ML IJ SOLN
INTRAMUSCULAR | Status: AC | PRN
  Administered 2021-12-07: 50 ug via INTRAVENOUS

## 2021-12-07 MED ORDER — LIDOCAINE-EPINEPHRINE 1 %-1:100000 IJ SOLN
INTRAMUSCULAR | Status: AC | PRN
  Administered 2021-12-07: 10 mL via INTRADERMAL

## 2021-12-07 MED ORDER — LIDOCAINE HCL 1 % IJ SOLN
INTRAMUSCULAR | Status: AC
Start: 2021-12-07 — End: 2021-12-07
  Filled 2021-12-07: qty 20

## 2021-12-07 MED ORDER — HEPARIN SOD (PORK) LOCK FLUSH 100 UNIT/ML IV SOLN
INTRAVENOUS | Status: AC | PRN
  Administered 2021-12-07: 500 [IU] via INTRAVENOUS

## 2021-12-07 MED ORDER — HEPARIN SOD (PORK) LOCK FLUSH 100 UNIT/ML IV SOLN
INTRAVENOUS | Status: AC
  Filled 2021-12-07: qty 5

## 2021-12-07 MED ORDER — MIDAZOLAM HCL 2 MG/2ML IJ SOLN
INTRAMUSCULAR | Status: AC
  Filled 2021-12-07: qty 4

## 2021-12-07 MED ORDER — FENTANYL CITRATE (PF) 100 MCG/2ML IJ SOLN
INTRAMUSCULAR | Status: AC
  Filled 2021-12-07: qty 2

## 2021-12-07 MED ORDER — MIDAZOLAM HCL 2 MG/2ML IJ SOLN
INTRAMUSCULAR | Status: AC | PRN
  Administered 2021-12-07: 1 mg via INTRAVENOUS

## 2021-12-07 MED ORDER — MIDAZOLAM HCL 2 MG/2ML IJ SOLN
INTRAMUSCULAR | Status: AC | PRN
Start: 2021-12-07 — End: 2021-12-07
  Administered 2021-12-07: 1 mg via INTRAVENOUS

## 2021-12-07 NOTE — Procedures (Signed)
Interventional Radiology Procedure Note  Procedure: Placement of a right IJ approach single lumen PowerPort.  Tip is positioned at the superior cavoatrial junction and catheter is ready for immediate use.  Complications: None Recommendations:  - Ok to shower tomorrow - Do not submerge for 7 days - Routine line care   Signed,  Baley Shands S. Ann Bohne, DO   

## 2021-12-08 ENCOUNTER — Other Ambulatory Visit (HOSPITAL_COMMUNITY): Payer: Self-pay

## 2021-12-08 NOTE — Telephone Encounter (Signed)
Pt is being followed and seen by GYN Oncology dept KW  ?

## 2021-12-09 ENCOUNTER — Telehealth: Payer: Self-pay

## 2021-12-09 NOTE — Telephone Encounter (Signed)
Returned her call. Forwarded her message to the person who is working on authorization for chemo on 4/18. She called her insurance and she does have insurance they had some type of glitch. ? ?Chemo authorization person resubmitted and authorization is pending. ? ? ?

## 2021-12-12 ENCOUNTER — Encounter: Payer: Self-pay | Admitting: Hematology and Oncology

## 2021-12-12 ENCOUNTER — Other Ambulatory Visit: Payer: Self-pay | Admitting: Hematology and Oncology

## 2021-12-12 DIAGNOSIS — Z510 Encounter for antineoplastic radiation therapy: Secondary | ICD-10-CM | POA: Diagnosis not present

## 2021-12-13 ENCOUNTER — Other Ambulatory Visit: Payer: Self-pay

## 2021-12-13 ENCOUNTER — Inpatient Hospital Stay: Payer: 59

## 2021-12-13 VITALS — BP 112/72 | HR 92 | Temp 97.9°F | Resp 18 | Wt 296.2 lb

## 2021-12-13 DIAGNOSIS — C541 Malignant neoplasm of endometrium: Secondary | ICD-10-CM

## 2021-12-13 DIAGNOSIS — Z510 Encounter for antineoplastic radiation therapy: Secondary | ICD-10-CM | POA: Diagnosis not present

## 2021-12-13 MED ORDER — SODIUM CHLORIDE 0.9% FLUSH
10.0000 mL | INTRAVENOUS | Status: DC | PRN
  Administered 2021-12-13: 10 mL

## 2021-12-13 MED ORDER — SODIUM CHLORIDE 0.9 % IV SOLN
150.0000 mg | Freq: Once | INTRAVENOUS | Status: AC
  Administered 2021-12-13: 150 mg via INTRAVENOUS
  Filled 2021-12-13: qty 150

## 2021-12-13 MED ORDER — HEPARIN SOD (PORK) LOCK FLUSH 100 UNIT/ML IV SOLN
500.0000 [IU] | Freq: Once | INTRAVENOUS | Status: AC | PRN
  Administered 2021-12-13: 500 [IU]

## 2021-12-13 MED ORDER — POTASSIUM CHLORIDE IN NACL 20-0.9 MEQ/L-% IV SOLN
Freq: Once | INTRAVENOUS | Status: AC
  Filled 2021-12-13: qty 1000

## 2021-12-13 MED ORDER — SODIUM CHLORIDE 0.9 % IV SOLN
39.5000 mg/m2 | Freq: Once | INTRAVENOUS | Status: AC
  Administered 2021-12-13: 100 mg via INTRAVENOUS
  Filled 2021-12-13: qty 100

## 2021-12-13 MED ORDER — PALONOSETRON HCL INJECTION 0.25 MG/5ML
0.2500 mg | Freq: Once | INTRAVENOUS | Status: AC
  Administered 2021-12-13: 0.25 mg via INTRAVENOUS
  Filled 2021-12-13: qty 5

## 2021-12-13 MED ORDER — SODIUM CHLORIDE 0.9 % IV SOLN: Freq: Once | INTRAVENOUS | Status: AC

## 2021-12-13 MED ORDER — SODIUM CHLORIDE 0.9 % IV SOLN
10.0000 mg | Freq: Once | INTRAVENOUS | Status: AC
  Administered 2021-12-13: 10 mg via INTRAVENOUS
  Filled 2021-12-13: qty 10

## 2021-12-13 MED ORDER — MAGNESIUM SULFATE 2 GM/50ML IV SOLN
2.0000 g | Freq: Once | INTRAVENOUS | Status: AC
  Administered 2021-12-13: 2 g via INTRAVENOUS
  Filled 2021-12-13: qty 50

## 2021-12-13 NOTE — Patient Instructions (Signed)
[facility] MEDICAL ONCOLOGY  Discharge Instructions: Thank you for choosing [facility] to provide your oncology and hematology care.   If you have a lab appointment with the Cancer Center, please go directly to the Cancer Center and check in at the registration area.   Wear comfortable clothing and clothing appropriate for easy access to any Portacath or PICC line.   We strive to give you quality time with your provider. You may need to reschedule your appointment if you arrive late (15 or more minutes).  Arriving late affects you and other patients whose appointments are after yours.  Also, if you miss three or more appointments without notifying the office, you may be dismissed from the clinic at the provider's discretion.      For prescription refill requests, have your pharmacy contact our office and allow 72 hours for refills to be completed.    Today you received the following chemotherapy and/or immunotherapy agents cisplatin   To help prevent nausea and vomiting after your treatment, we encourage you to take your nausea medication as directed.  BELOW ARE SYMPTOMS THAT SHOULD BE REPORTED IMMEDIATELY: *FEVER GREATER THAN 100.4 F (38 C) OR HIGHER *CHILLS OR SWEATING *NAUSEA AND VOMITING THAT IS NOT CONTROLLED WITH YOUR NAUSEA MEDICATION *UNUSUAL SHORTNESS OF BREATH *UNUSUAL BRUISING OR BLEEDING *URINARY PROBLEMS (pain or burning when urinating, or frequent urination) *BOWEL PROBLEMS (unusual diarrhea, constipation, pain near the anus) TENDERNESS IN MOUTH AND THROAT WITH OR WITHOUT PRESENCE OF ULCERS (sore throat, sores in mouth, or a toothache) UNUSUAL RASH, SWELLING OR PAIN  UNUSUAL VAGINAL DISCHARGE OR ITCHING   Items with * indicate a potential emergency and should be followed up as soon as possible or go to the Emergency Department if any problems should occur.  Please show the CHEMOTHERAPY ALERT CARD or IMMUNOTHERAPY ALERT CARD at check-in to the  Emergency Department and triage nurse.  Should you have questions after your visit or need to cancel or reschedule your appointment, please contact [facility]COL[facility]  Dept: [phone]  and follow the prompts.  Office hours are 8:00 a.m. to 4:30 p.m. Monday - Friday. Please note that voicemails left after 4:00 p.m. may not be returned until the following business day.  We are closed weekends and major holidays. You have access to a nurse at all times for urgent questions. Please call the main number to the clinic Dept: [phone] and follow the prompts.   For any non-urgent questions, you may also contact your provider using MyChart. We now offer e-Visits for anyone 18 and older to request care online for non-urgent symptoms. For details visit [email or web address].   Also download the MyChart app! Go to the app store, search "MyChart", open the app, select [facility], and log in with your MyChart username and password.  Due to Covid, a mask is required upon entering the hospital/clinic. If you do not have a mask, one will be given to you upon arrival. For doctor visits, patients may have 1 support person aged 18 or older with them. For treatment visits, patients cannot have anyone with them due to current Covid guidelines and our immunocompromised population.   

## 2021-12-14 ENCOUNTER — Telehealth: Payer: Self-pay

## 2021-12-14 ENCOUNTER — Ambulatory Visit
Admission: RE | Admit: 2021-12-14 | Discharge: 2021-12-14 | Disposition: A | Discharge status: Home / Self Care | Payer: 59 | Source: Ambulatory Visit | Attending: Radiation Oncology | Admitting: Radiation Oncology

## 2021-12-14 ENCOUNTER — Other Ambulatory Visit: Payer: Self-pay

## 2021-12-14 DIAGNOSIS — C541 Malignant neoplasm of endometrium: Secondary | ICD-10-CM

## 2021-12-14 DIAGNOSIS — Z510 Encounter for antineoplastic radiation therapy: Secondary | ICD-10-CM | POA: Diagnosis not present

## 2021-12-14 LAB — RAD ONC ARIA SESSION SUMMARY
Course Elapsed Days: 0
Plan Fractions Treated to Date: 1
Plan Prescribed Dose Per Fraction: 1.8 Gy
Plan Total Fractions Prescribed: 25
Plan Total Prescribed Dose: 45 Gy
Reference Point Dosage Given to Date: 1.8 Gy
Reference Point Session Dosage Given: 1.8 Gy
Session Number: 1

## 2021-12-14 NOTE — Telephone Encounter (Signed)
Terri Heath states that she is doing fine today. She is drinking,eating and urinating well. ?No N/V. ?She knows to call the office at [phone] if she has any questions or concerns. ?

## 2021-12-14 NOTE — Telephone Encounter (Signed)
-----   Message from Gillian Shields, RN sent at 12/13/2021  1:39 PM EDT ----- ?Regarding: F/U Call for first time cisplatin Terri Heath ?First time cisplatin. Dr. Alvy Heath. Completed treatment with no complications.  ? ?

## 2021-12-15 ENCOUNTER — Other Ambulatory Visit: Payer: Self-pay

## 2021-12-15 ENCOUNTER — Ambulatory Visit
Admission: RE | Admit: 2021-12-15 | Discharge: 2021-12-15 | Disposition: A | Discharge status: Home / Self Care | Payer: 59 | Source: Ambulatory Visit | Attending: Radiation Oncology | Admitting: Radiation Oncology

## 2021-12-15 ENCOUNTER — Encounter: Payer: 59 | Admitting: Obstetrics & Gynecology

## 2021-12-15 DIAGNOSIS — Z510 Encounter for antineoplastic radiation therapy: Secondary | ICD-10-CM | POA: Diagnosis not present

## 2021-12-15 LAB — RAD ONC ARIA SESSION SUMMARY
Course Elapsed Days: 1
Plan Fractions Treated to Date: 2
Plan Prescribed Dose Per Fraction: 1.8 Gy
Plan Total Fractions Prescribed: 25
Plan Total Prescribed Dose: 45 Gy
Reference Point Dosage Given to Date: 3.6 Gy
Reference Point Session Dosage Given: 1.8 Gy
Session Number: 2

## 2021-12-16 ENCOUNTER — Other Ambulatory Visit: Payer: Self-pay

## 2021-12-16 ENCOUNTER — Ambulatory Visit
Admission: RE | Admit: 2021-12-16 | Discharge: 2021-12-16 | Disposition: A | Discharge status: Home / Self Care | Payer: 59 | Source: Ambulatory Visit | Attending: Radiation Oncology | Admitting: Radiation Oncology

## 2021-12-16 DIAGNOSIS — Z510 Encounter for antineoplastic radiation therapy: Secondary | ICD-10-CM | POA: Diagnosis not present

## 2021-12-16 LAB — RAD ONC ARIA SESSION SUMMARY
Course Elapsed Days: 2
Plan Fractions Treated to Date: 3
Plan Prescribed Dose Per Fraction: 1.8 Gy
Plan Total Fractions Prescribed: 25
Plan Total Prescribed Dose: 45 Gy
Reference Point Dosage Given to Date: 5.4 Gy
Reference Point Session Dosage Given: 1.8 Gy
Session Number: 3

## 2021-12-19 ENCOUNTER — Other Ambulatory Visit: Payer: Self-pay

## 2021-12-19 ENCOUNTER — Ambulatory Visit
Admission: RE | Admit: 2021-12-19 | Discharge: 2021-12-19 | Disposition: A | Discharge status: Home / Self Care | Payer: 59 | Source: Ambulatory Visit | Attending: Radiation Oncology | Admitting: Radiation Oncology

## 2021-12-19 ENCOUNTER — Inpatient Hospital Stay: Payer: 59

## 2021-12-19 DIAGNOSIS — C541 Malignant neoplasm of endometrium: Secondary | ICD-10-CM

## 2021-12-19 DIAGNOSIS — Z510 Encounter for antineoplastic radiation therapy: Secondary | ICD-10-CM | POA: Diagnosis not present

## 2021-12-19 LAB — CBC WITH DIFFERENTIAL (CANCER CENTER ONLY)
Abs Immature Granulocytes: 0.04 10*3/uL (ref 0.00–0.07)
Basophils Absolute: 0 10*3/uL (ref 0.0–0.1)
Basophils Relative: 1 %
Eosinophils Absolute: 0.2 10*3/uL (ref 0.0–0.5)
Eosinophils Relative: 2 %
HCT: 39.3 % (ref 36.0–46.0)
Hemoglobin: 13.1 g/dL (ref 12.0–15.0)
Immature Granulocytes: 1 %
Lymphocytes Relative: 27 %
Lymphs Abs: 2.2 10*3/uL (ref 0.7–4.0)
MCH: 28.4 pg (ref 26.0–34.0)
MCHC: 33.3 g/dL (ref 30.0–36.0)
MCV: 85.2 fL (ref 80.0–100.0)
Monocytes Absolute: 0.5 10*3/uL (ref 0.1–1.0)
Monocytes Relative: 6 %
Neutro Abs: 5.1 10*3/uL (ref 1.7–7.7)
Neutrophils Relative %: 63 %
Platelet Count: 303 10*3/uL (ref 150–400)
RBC: 4.61 MIL/uL (ref 3.87–5.11)
RDW: 12.7 % (ref 11.5–15.5)
WBC Count: 8.1 10*3/uL (ref 4.0–10.5)
nRBC: 0 % (ref 0.0–0.2)

## 2021-12-19 LAB — RAD ONC ARIA SESSION SUMMARY
Course Elapsed Days: 5
Plan Fractions Treated to Date: 4
Plan Prescribed Dose Per Fraction: 1.8 Gy
Plan Total Fractions Prescribed: 25
Plan Total Prescribed Dose: 45 Gy
Reference Point Dosage Given to Date: 7.2 Gy
Reference Point Session Dosage Given: 1.8 Gy
Session Number: 4

## 2021-12-19 LAB — BASIC METABOLIC PANEL - CANCER CENTER ONLY
Anion gap: 10 (ref 5–15)
BUN: 23 mg/dL (ref 8–23)
CO2: 28 mmol/L (ref 22–32)
Calcium: 9 mg/dL (ref 8.9–10.3)
Chloride: 97 mmol/L — ABNORMAL LOW (ref 98–111)
Creatinine: 1.18 mg/dL — ABNORMAL HIGH (ref 0.44–1.00)
GFR, Estimated: 52 mL/min — ABNORMAL LOW (ref >60)
Glucose, Bld: 193 mg/dL — ABNORMAL HIGH (ref 70–99)
Potassium: 4.3 mmol/L (ref 3.5–5.1)
Sodium: 135 mmol/L (ref 135–145)

## 2021-12-19 LAB — MAGNESIUM: Magnesium: 1.5 mg/dL — ABNORMAL LOW (ref 1.7–2.4)

## 2021-12-19 MED ORDER — HEPARIN SOD (PORK) LOCK FLUSH 100 UNIT/ML IV SOLN
500.0000 [IU] | Freq: Once | INTRAVENOUS | Status: AC
  Administered 2021-12-19: 500 [IU]

## 2021-12-19 MED ORDER — SODIUM CHLORIDE 0.9% FLUSH
10.0000 mL | Freq: Once | INTRAVENOUS | Status: AC
  Administered 2021-12-19: 10 mL

## 2021-12-20 ENCOUNTER — Encounter: Payer: Self-pay | Admitting: Hematology and Oncology

## 2021-12-20 ENCOUNTER — Other Ambulatory Visit: Payer: Self-pay

## 2021-12-20 ENCOUNTER — Ambulatory Visit
Admission: RE | Admit: 2021-12-20 | Discharge: 2021-12-20 | Disposition: A | Discharge status: Home / Self Care | Payer: 59 | Source: Ambulatory Visit | Attending: Radiation Oncology | Admitting: Radiation Oncology

## 2021-12-20 ENCOUNTER — Inpatient Hospital Stay: Payer: 59 | Admitting: Hematology and Oncology

## 2021-12-20 DIAGNOSIS — I952 Hypotension due to drugs: Secondary | ICD-10-CM

## 2021-12-20 DIAGNOSIS — E860 Dehydration: Secondary | ICD-10-CM | POA: Diagnosis not present

## 2021-12-20 DIAGNOSIS — R7989 Other specified abnormal findings of blood chemistry: Secondary | ICD-10-CM

## 2021-12-20 DIAGNOSIS — C541 Malignant neoplasm of endometrium: Secondary | ICD-10-CM | POA: Diagnosis not present

## 2021-12-20 DIAGNOSIS — Z510 Encounter for antineoplastic radiation therapy: Secondary | ICD-10-CM | POA: Diagnosis not present

## 2021-12-20 HISTORY — DX: Dehydration: E86.0

## 2021-12-20 HISTORY — DX: Hypotension due to drugs: I95.2

## 2021-12-20 HISTORY — DX: Other specified abnormal findings of blood chemistry: R79.89

## 2021-12-20 LAB — RAD ONC ARIA SESSION SUMMARY
Course Elapsed Days: 6
Plan Fractions Treated to Date: 5
Plan Prescribed Dose Per Fraction: 1.8 Gy
Plan Total Fractions Prescribed: 25
Plan Total Prescribed Dose: 45 Gy
Reference Point Dosage Given to Date: 9 Gy
Reference Point Session Dosage Given: 1.8 Gy
Session Number: 5

## 2021-12-20 MED ORDER — CARVEDILOL 12.5 MG PO TABS: ORAL_TABLET | ORAL | 3 refills | Status: DC

## 2021-12-20 MED ORDER — SPIRONOLACTONE 25 MG PO TABS: ORAL_TABLET | ORAL | 3 refills | Status: DC

## 2021-12-20 MED ORDER — FUROSEMIDE 40 MG PO TABS: ORAL_TABLET | ORAL | 3 refills | Status: DC

## 2021-12-20 NOTE — Assessment & Plan Note (Signed)
Repeat blood work from yesterday show signs of dehydration ?We discussed importance of hydration and modification of her diuretic ?

## 2021-12-20 NOTE — Assessment & Plan Note (Signed)
She had lost 5 pounds and appears clinically dehydrated ?She is symptomatic ?We discussed risk and benefits of IV fluid support and she declined ?As above, I will modify her diuretic therapy and will reassess next week ?

## 2021-12-20 NOTE — Progress Notes (Signed)
[place] ?OFFICE PROGRESS NOTE ? ?Patient Care Team: ?Early, Coralee Pesa, NP as PCP - General (Nurse Practitioner) ?Jettie Booze, MD as PCP - Cardiology (Cardiology) ?Rigoberto Noel, MD as Consulting Physician (Pulmonary Disease) ?Alphonsa Overall, MD (General Surgery) ?Awanda Mink Craige Cotta, RN as Oncology Nurse Navigator (Oncology) ?Heath Lark, MD as Consulting Physician (Hematology and Oncology) ? ?ASSESSMENT & PLAN:  ?Endometrial cancer ([place]) ?The patient has significant drop in blood pressure and is clinically dehydrated ?I recommend discontinuation of furosemide for several days ?I also recommend reducing spironolactone to half a tablet and carvedilol by half a tablet ?We will reassess next week ?She is reminded to drink more fluids ? ?Dehydration ?She had lost 5 pounds and appears clinically dehydrated ?She is symptomatic ?We discussed risk and benefits of IV fluid support and she declined ?As above, I will modify her diuretic therapy and will reassess next week ? ?Elevated serum creatinine ?Repeat blood work from yesterday show signs of dehydration ?We discussed importance of hydration and modification of her diuretic ? ?Drug-induced hypotension ?She is profoundly hypotensive today ?As above, I will change her cardiac medications ?We will reassess next week ?The patient is instructed to call if she continues to have problem with dizziness ? ?No orders of the defined types were placed in this encounter. ? ? ?All questions were answered. The patient knows to call the clinic with any problems, questions or concerns. ?The total time spent in the appointment was 30 minutes encounter with patients including review of chart and various tests results, discussions about plan of care and coordination of care plan ?  ?Heath Lark, MD ?12/20/2021 11:22 AM ? ?INTERVAL HISTORY: ?Please see below for problem oriented charting. ?she returns for treatment follow-up ?The patient is noted to be profoundly  hypotensive ?She is somewhat weak and dizzy ?Denies chest pain or shortness of breath ?Denies recent diarrhea, nausea or vomiting ? ?REVIEW OF SYSTEMS:   ?Constitutional: Denies fevers, chills or abnormal weight loss ?Eyes: Denies blurriness of vision ?Ears, nose, mouth, throat, and face: Denies mucositis or sore throat ?Respiratory: Denies cough, dyspnea or wheezes ?Cardiovascular: Denies palpitation, chest discomfort or lower extremity swelling ?Gastrointestinal:  Denies nausea, heartburn or change in bowel habits ?Skin: Denies abnormal skin rashes ?Lymphatics: Denies new lymphadenopathy or easy bruising ?Neurological:Denies numbness, tingling or new weaknesses ?Behavioral/Psych: Mood is stable, no new changes  ?All other systems were reviewed with the patient and are negative. ? ?I have reviewed the past medical history, past surgical history, social history and family history with the patient and they are unchanged from previous note. ? ?ALLERGIES:  is allergic to amoxicillin, adhesive [tape], exenatide, invokana [canagliflozin], jardiance [empagliflozin], lisinopril, other, and statins. ? ?MEDICATIONS:  ?Current Outpatient Medications  ?Medication Sig Dispense Refill  ? acetaminophen (TYLENOL) 500 MG tablet Take 500 mg by mouth every 6 (six) hours as needed for moderate pain.    ? albuterol (PROAIR HFA) 108 (90 Base) MCG/ACT inhaler Inhale 2 puffs into the lungs every 6 (six) hours as needed. wheezing 18 g 2  ? aspirin 81 MG chewable tablet Chew 1 tablet (81 mg total) by mouth daily. 90 tablet 3  ? bismuth subsalicylate (PEPTO BISMOL) 262 MG chewable tablet Chew 524 mg by mouth as needed for indigestion or diarrhea or loose stools.     ? blood glucose meter kit and supplies Use up to four times daily as directed. (FOR ICD-10 E10.9, E11.9). 1 each 99  ? carvedilol (COREG) 12.5 MG tablet Patient  was instructed to reduce to 1/2 tablet twice daily on 4/25 due to dehydration-NG 180 tablet 3  ? diphenhydrAMINE  (BENADRYL) 25 MG tablet Take 25 mg by mouth every 6 (six) hours as needed for allergies.    ? diphenoxylate-atropine (LOMOTIL) 2.5-0.025 MG tablet Take 1 tablet by mouth 4 (four) times daily as needed for diarrhea or loose stools. 30 tablet 0  ? Dulaglutide (TRULICITY) 3 WE/9.9BZ SOPN Inject 3 mg into the skin once a week. 6 mL 3  ? Evolocumab (REPATHA SURECLICK) 169 MG/ML SOAJ Inject 1 pen into the skin every 14 (fourteen) days. 2 mL 11  ? furosemide (LASIX) 40 MG tablet Patient instructed to hold on 4/25 due to dehydration-NG 180 tablet 3  ? glucose blood test strip Use as directed to test blood sugar 100 each 1  ? insulin glargine-yfgn (SEMGLEE) 100 UNIT/ML Pen Inject 25-40 Units into the skin daily based on blood sugar levels via sliding scale. 15 mL 11  ? Insulin Pen Needle (UNIFINE PENTIPS) 32G X 4 MM MISC USE AS DIRECTED ONCE A DAY 100 each 3  ? levothyroxine (SYNTHROID) 50 MCG tablet Take 1 tablet (50 mcg total) by mouth daily before breakfast. 90 tablet 0  ? lidocaine-prilocaine (EMLA) cream Apply to affected area once 30 g 3  ? magnesium oxide (MAG-OX) 400 MG tablet Take 400 mg by mouth at bedtime as needed (for cramps).    ? metFORMIN (GLUCOPHAGE) 1000 MG tablet Take 1 tablet (1,000 mg total) by mouth 2 (two) times daily with a meal. 180 tablet 3  ? Multiple Vitamin (MULITIVITAMIN WITH MINERALS) TABS Take 1 tablet by mouth daily with breakfast.    ? ondansetron (ZOFRAN) 8 MG tablet Take 1 tablet (8 mg total) by mouth 2 (two) times daily as needed. Start on the third day after chemotherapy. 30 tablet 1  ? OneTouch Delica Lancets 67E MISC use as directed 100 each 1  ? prochlorperazine (COMPAZINE) 10 MG tablet Take 1 tablet (10 mg total) by mouth every 6 (six) hours as needed (Nausea or vomiting). 30 tablet 1  ? sacubitril-valsartan (ENTRESTO) 49-51 MG TAKE 1 TABLET BY MOUTH 2 (TWO) TIMES DAILY. 60 tablet 4  ? spironolactone (ALDACTONE) 25 MG tablet Patient is instructed to reduce to 1/2 tablet on 4/25 due  to dehydration-NG 90 tablet 3  ? triamcinolone ointment (KENALOG) 0.1 % Apply 1 application topically two times daily to affected area(s) as needed, sparing use to avoid whitening/thinning skin 30 g 1  ? ?No current facility-administered medications for this visit.  ? ? ?SUMMARY OF ONCOLOGIC HISTORY: ?Oncology History  ?Endometrial cancer [facility])  ?10/27/2021 Pathology Results  ? FINAL MICROSCOPIC DIAGNOSIS:  ? ?A. ENDOMETRIUM, BIOPSY:  ?- Endometrioid adenocarcinoma with focal secretory features.  ?- See comment.  ? ?COMMENT:  ?The carcinoma has endometrioid features with a few small foci with secretory features.  There are several foci of solid pattern and the findings are consistent with FIGO grade 1/2.  Immunohistochemistry shows diffuse strong positivity estrogen receptor and Napsin A is negative.  ?P53 shows wild-type pattern.  ?  ?11/07/2021 Initial Diagnosis  ? Endometrial cancer ([place]) ? ?  ?11/14/2021 Imaging  ? MRI pelvis ?1. Thickened (18 mm) endometrium, compatible with known endometrial malignancy. Evidence of full-thickness myometrial invasion by endometrial tumor throughout the left uterine body extending over 180 degrees of involvement. Probable early small focus of direct tumor invasion into the parametrial soft tissues in the anterior left uterine body as detailed. ?2. Suspect a  combination of direct endometrial tumor involvement of the upper endocervical canal and blood products within the endocervix. ?3. No pelvic lymphadenopathy.  No adnexal masses. ?  ?  ?  ?11/24/2021 Imaging  ? CT abdomen and pelvis ?No evidence of abdominal metastatic disease or other acute findings. ?  ?Mild hepatic steatosis. ?  ?Colonic diverticulosis, without radiographic evidence of diverticulitis. ?  ?Aortic Atherosclerosis (ICD10-I70.0). ?  ?  ?12/02/2021 Cancer Staging  ? Staging form: Corpus Uteri - Carcinoma and Carcinosarcoma, AJCC 8th Edition ?- Clinical stage from 12/02/2021: FIGO Stage IIIB (cT3b, cN0, cM0) - Signed by  Heath Lark, MD on 12/02/2021 ?Stage prefix: Initial diagnosis ? ?  ?12/07/2021 Procedure  ? Status post placement of right IJ port catheter ?  ?12/13/2021 -  Chemotherapy  ? Patient is on Treatment Plan : Uterine Cisplatin da

## 2021-12-20 NOTE — Assessment & Plan Note (Signed)
The patient has significant drop in blood pressure and is clinically dehydrated ?I recommend discontinuation of furosemide for several days ?I also recommend reducing spironolactone to half a tablet and carvedilol by half a tablet ?We will reassess next week ?She is reminded to drink more fluids ?

## 2021-12-20 NOTE — Assessment & Plan Note (Signed)
She is profoundly hypotensive today ?As above, I will change her cardiac medications ?We will reassess next week ?The patient is instructed to call if she continues to have problem with dizziness ?

## 2021-12-21 ENCOUNTER — Other Ambulatory Visit: Payer: Self-pay

## 2021-12-21 ENCOUNTER — Ambulatory Visit
Admission: RE | Admit: 2021-12-21 | Discharge: 2021-12-21 | Disposition: A | Discharge status: Home / Self Care | Payer: 59 | Source: Ambulatory Visit | Attending: Radiation Oncology | Admitting: Radiation Oncology

## 2021-12-21 DIAGNOSIS — Z510 Encounter for antineoplastic radiation therapy: Secondary | ICD-10-CM | POA: Diagnosis not present

## 2021-12-21 LAB — RAD ONC ARIA SESSION SUMMARY
Course Elapsed Days: 7
Plan Fractions Treated to Date: 6
Plan Prescribed Dose Per Fraction: 1.8 Gy
Plan Total Fractions Prescribed: 25
Plan Total Prescribed Dose: 45 Gy
Reference Point Dosage Given to Date: 10.8 Gy
Reference Point Session Dosage Given: 1.8 Gy
Session Number: 6

## 2021-12-22 ENCOUNTER — Other Ambulatory Visit: Payer: Self-pay

## 2021-12-22 ENCOUNTER — Ambulatory Visit
Admission: RE | Admit: 2021-12-22 | Discharge: 2021-12-22 | Disposition: A | Discharge status: Home / Self Care | Payer: 59 | Source: Ambulatory Visit | Attending: Radiation Oncology | Admitting: Radiation Oncology

## 2021-12-22 DIAGNOSIS — Z510 Encounter for antineoplastic radiation therapy: Secondary | ICD-10-CM | POA: Diagnosis not present

## 2021-12-22 LAB — RAD ONC ARIA SESSION SUMMARY
Course Elapsed Days: 8
Plan Fractions Treated to Date: 7
Plan Prescribed Dose Per Fraction: 1.8 Gy
Plan Total Fractions Prescribed: 25
Plan Total Prescribed Dose: 45 Gy
Reference Point Dosage Given to Date: 12.6 Gy
Reference Point Session Dosage Given: 1.8 Gy
Session Number: 7

## 2021-12-23 ENCOUNTER — Other Ambulatory Visit: Payer: Self-pay

## 2021-12-23 ENCOUNTER — Ambulatory Visit
Admission: RE | Admit: 2021-12-23 | Discharge: 2021-12-23 | Disposition: A | Discharge status: Home / Self Care | Payer: 59 | Source: Ambulatory Visit | Attending: Radiation Oncology | Admitting: Radiation Oncology

## 2021-12-23 DIAGNOSIS — Z510 Encounter for antineoplastic radiation therapy: Secondary | ICD-10-CM | POA: Diagnosis not present

## 2021-12-23 LAB — RAD ONC ARIA SESSION SUMMARY
Course Elapsed Days: 9
Plan Fractions Treated to Date: 8
Plan Prescribed Dose Per Fraction: 1.8 Gy
Plan Total Fractions Prescribed: 25
Plan Total Prescribed Dose: 45 Gy
Reference Point Dosage Given to Date: 14.4 Gy
Reference Point Session Dosage Given: 1.8 Gy
Session Number: 8

## 2021-12-26 ENCOUNTER — Inpatient Hospital Stay: Payer: 59 | Attending: Gynecologic Oncology

## 2021-12-26 ENCOUNTER — Other Ambulatory Visit (HOSPITAL_COMMUNITY): Payer: Self-pay

## 2021-12-26 ENCOUNTER — Other Ambulatory Visit: Payer: Self-pay

## 2021-12-26 ENCOUNTER — Ambulatory Visit
Admission: RE | Admit: 2021-12-26 | Discharge: 2021-12-26 | Disposition: A | Discharge status: Home / Self Care | Payer: 59 | Source: Ambulatory Visit | Attending: Radiation Oncology | Admitting: Radiation Oncology

## 2021-12-26 ENCOUNTER — Telehealth: Payer: Self-pay | Admitting: Oncology

## 2021-12-26 DIAGNOSIS — I5022 Chronic systolic (congestive) heart failure: Secondary | ICD-10-CM | POA: Insufficient documentation

## 2021-12-26 DIAGNOSIS — D61818 Other pancytopenia: Secondary | ICD-10-CM | POA: Diagnosis not present

## 2021-12-26 DIAGNOSIS — C541 Malignant neoplasm of endometrium: Secondary | ICD-10-CM | POA: Insufficient documentation

## 2021-12-26 DIAGNOSIS — E1159 Type 2 diabetes mellitus with other circulatory complications: Secondary | ICD-10-CM | POA: Diagnosis not present

## 2021-12-26 DIAGNOSIS — E119 Type 2 diabetes mellitus without complications: Secondary | ICD-10-CM | POA: Diagnosis not present

## 2021-12-26 DIAGNOSIS — Z5111 Encounter for antineoplastic chemotherapy: Secondary | ICD-10-CM | POA: Insufficient documentation

## 2021-12-26 LAB — RAD ONC ARIA SESSION SUMMARY
Course Elapsed Days: 12
Plan Fractions Treated to Date: 9
Plan Prescribed Dose Per Fraction: 1.8 Gy
Plan Total Fractions Prescribed: 25
Plan Total Prescribed Dose: 45 Gy
Reference Point Dosage Given to Date: 16.2 Gy
Reference Point Session Dosage Given: 1.8 Gy
Session Number: 9

## 2021-12-26 LAB — BASIC METABOLIC PANEL - CANCER CENTER ONLY
Anion gap: 5 (ref 5–15)
BUN: 13 mg/dL (ref 8–23)
CO2: 26 mmol/L (ref 22–32)
Calcium: 9 mg/dL (ref 8.9–10.3)
Chloride: 104 mmol/L (ref 98–111)
Creatinine: 0.75 mg/dL (ref 0.44–1.00)
GFR, Estimated: >60 mL/min (ref >60)
Glucose, Bld: 210 mg/dL — ABNORMAL HIGH (ref 70–99)
Potassium: 4.9 mmol/L (ref 3.5–5.1)
Sodium: 135 mmol/L (ref 135–145)

## 2021-12-26 LAB — CBC WITH DIFFERENTIAL (CANCER CENTER ONLY)
Abs Immature Granulocytes: 0.01 10*3/uL (ref 0.00–0.07)
Basophils Absolute: 0 10*3/uL (ref 0.0–0.1)
Basophils Relative: 1 %
Eosinophils Absolute: 0.1 10*3/uL (ref 0.0–0.5)
Eosinophils Relative: 4 %
HCT: 35.7 % — ABNORMAL LOW (ref 36.0–46.0)
Hemoglobin: 11.6 g/dL — ABNORMAL LOW (ref 12.0–15.0)
Immature Granulocytes: 0 %
Lymphocytes Relative: 31 %
Lymphs Abs: 1.3 10*3/uL (ref 0.7–4.0)
MCH: 28.2 pg (ref 26.0–34.0)
MCHC: 32.5 g/dL (ref 30.0–36.0)
MCV: 86.7 fL (ref 80.0–100.0)
Monocytes Absolute: 0.4 10*3/uL (ref 0.1–1.0)
Monocytes Relative: 11 %
Neutro Abs: 2.1 10*3/uL (ref 1.7–7.7)
Neutrophils Relative %: 53 %
Platelet Count: 187 10*3/uL (ref 150–400)
RBC: 4.12 MIL/uL (ref 3.87–5.11)
RDW: 13.1 % (ref 11.5–15.5)
WBC Count: 4 10*3/uL (ref 4.0–10.5)
nRBC: 0 % (ref 0.0–0.2)

## 2021-12-26 LAB — MAGNESIUM: Magnesium: 1.5 mg/dL — ABNORMAL LOW (ref 1.7–2.4)

## 2021-12-26 MED ORDER — HEPARIN SOD (PORK) LOCK FLUSH 100 UNIT/ML IV SOLN
500.0000 [IU] | Freq: Once | INTRAVENOUS | Status: AC
  Administered 2021-12-26: 500 [IU]

## 2021-12-26 MED ORDER — SODIUM CHLORIDE 0.9% FLUSH
10.0000 mL | Freq: Once | INTRAVENOUS | Status: AC
  Administered 2021-12-26: 10 mL

## 2021-12-26 NOTE — Telephone Encounter (Signed)
Called [employer] and initiated expedited piror authorization for Zofran quantity.  Prior authorization [id number].  ?

## 2021-12-27 ENCOUNTER — Encounter: Payer: Self-pay | Admitting: Hematology and Oncology

## 2021-12-27 ENCOUNTER — Other Ambulatory Visit: Payer: Self-pay

## 2021-12-27 ENCOUNTER — Inpatient Hospital Stay (HOSPITAL_BASED_OUTPATIENT_CLINIC_OR_DEPARTMENT_OTHER): Payer: 59 | Admitting: Hematology and Oncology

## 2021-12-27 ENCOUNTER — Telehealth: Payer: Self-pay

## 2021-12-27 ENCOUNTER — Ambulatory Visit
Admission: RE | Admit: 2021-12-27 | Discharge: 2021-12-27 | Disposition: A | Discharge status: Home / Self Care | Payer: 59 | Source: Ambulatory Visit | Attending: Radiation Oncology | Admitting: Radiation Oncology

## 2021-12-27 DIAGNOSIS — I5022 Chronic systolic (congestive) heart failure: Secondary | ICD-10-CM

## 2021-12-27 DIAGNOSIS — E1159 Type 2 diabetes mellitus with other circulatory complications: Secondary | ICD-10-CM

## 2021-12-27 DIAGNOSIS — C541 Malignant neoplasm of endometrium: Secondary | ICD-10-CM | POA: Diagnosis not present

## 2021-12-27 DIAGNOSIS — I952 Hypotension due to drugs: Secondary | ICD-10-CM

## 2021-12-27 DIAGNOSIS — Z5111 Encounter for antineoplastic chemotherapy: Secondary | ICD-10-CM | POA: Diagnosis not present

## 2021-12-27 DIAGNOSIS — E1165 Type 2 diabetes mellitus with hyperglycemia: Secondary | ICD-10-CM

## 2021-12-27 LAB — RAD ONC ARIA SESSION SUMMARY
Course Elapsed Days: 13
Plan Fractions Treated to Date: 10
Plan Prescribed Dose Per Fraction: 1.8 Gy
Plan Total Fractions Prescribed: 25
Plan Total Prescribed Dose: 45 Gy
Reference Point Dosage Given to Date: 18 Gy
Reference Point Session Dosage Given: 1.8 Gy
Session Number: 10

## 2021-12-27 MED ORDER — FUROSEMIDE 40 MG PO TABS: 40.0000 mg | ORAL_TABLET | Freq: Every day | ORAL | 3 refills | Status: DC

## 2021-12-27 NOTE — Assessment & Plan Note (Signed)
Her blood pressure is back to normal ?We will keep Coreg and spironolactone at half dose ?She will resume furosemide ?

## 2021-12-27 NOTE — Telephone Encounter (Signed)
Notified Patient of prior authorization denial for Ondansetron '8mg'$  Tablets  quantity of 30 per 15 days and of request for expedited appeal by telephone. Patient will be notified of  appeal decision. No other needs or concerns voiced at this time.  ?

## 2021-12-27 NOTE — Progress Notes (Signed)
[place] ?OFFICE PROGRESS NOTE ? ?Patient Care Team: ?Early, Coralee Pesa, NP as PCP - General (Nurse Practitioner) ?Jettie Booze, MD as PCP - Cardiology (Cardiology) ?Rigoberto Noel, MD as Consulting Physician (Pulmonary Disease) ?Alphonsa Overall, MD (General Surgery) ?Awanda Mink Craige Cotta, RN as Oncology Nurse Navigator (Oncology) ?Heath Lark, MD as Consulting Physician (Hematology and Oncology) ? ?ASSESSMENT & PLAN:  ?Endometrial cancer ([employer]) ?Renal function has improved ?I suspect the weight she has gained in the past 7 days related to improvement of her fluid status when I withheld her furosemide ?She has mild leg edema on examination ?She will resume furosemide at 40 mg daily ?We discussed the importance of dietary modification and I will see her again next week for further follow-up ? ?Drug-induced hypotension ?Her blood pressure is back to normal ?We will keep Coreg and spironolactone at half dose ?She will resume furosemide ? ?Chronic systolic CHF (congestive heart failure), NYHA class 3 ([place]) ?I suspect her significant weight gain is due to fluid retention ?She will resume Lasix ? ?Poorly controlled type 2 diabetes mellitus with circulatory disorder ([place]) ?We discussed the importance of dietary modification and low carbohydrate diet ? ?No orders of the defined types were placed in this encounter. ? ? ?All questions were answered. The patient knows to call the clinic with any problems, questions or concerns. ?The total time spent in the appointment was 20 minutes encounter with patients including review of chart and various tests results, discussions about plan of care and coordination of care plan ?  ?Heath Lark, MD ?12/27/2021 9:57 AM ? ?INTERVAL HISTORY: ?Please see below for problem oriented charting. ?she returns for treatment follow-up  ?Last week, she had mild intermittent abdominal cramping/nausea ?Zofran was helpful ?Since I reduce the dose of her antihypertensives, she felt better ?She is  drinking lots of fluids ?She had mild loose stool ? ?REVIEW OF SYSTEMS:   ?Constitutional: Denies fevers, chills or abnormal weight loss ?Eyes: Denies blurriness of vision ?Ears, nose, mouth, throat, and face: Denies mucositis or sore throat ?Respiratory: Denies cough, dyspnea or wheezes ?Cardiovascular: Denies palpitation, chest discomfort  ?Gastrointestinal:  Denies nausea, heartburn or change in bowel habits ?Skin: Denies abnormal skin rashes ?Lymphatics: Denies new lymphadenopathy or easy bruising ?Neurological:Denies numbness, tingling or new weaknesses ?Behavioral/Psych: Mood is stable, no new changes  ?All other systems were reviewed with the patient and are negative. ? ?I have reviewed the past medical history, past surgical history, social history and family history with the patient and they are unchanged from previous note. ? ?ALLERGIES:  is allergic to amoxicillin, adhesive [tape], exenatide, invokana [canagliflozin], jardiance [empagliflozin], lisinopril, other, and statins. ? ?MEDICATIONS:  ?Current Outpatient Medications  ?Medication Sig Dispense Refill  ? acetaminophen (TYLENOL) 500 MG tablet Take 500 mg by mouth every 6 (six) hours as needed for moderate pain.    ? albuterol (PROAIR HFA) 108 (90 Base) MCG/ACT inhaler Inhale 2 puffs into the lungs every 6 (six) hours as needed. wheezing 18 g 2  ? aspirin 81 MG chewable tablet Chew 1 tablet (81 mg total) by mouth daily. 90 tablet 3  ? bismuth subsalicylate (PEPTO BISMOL) 262 MG chewable tablet Chew 524 mg by mouth as needed for indigestion or diarrhea or loose stools.     ? blood glucose meter kit and supplies Use up to four times daily as directed. (FOR ICD-10 E10.9, E11.9). 1 each 99  ? carvedilol (COREG) 12.5 MG tablet Patient was instructed to reduce to 1/2 tablet twice  daily on 4/25 due to dehydration-NG 180 tablet 3  ? diphenhydrAMINE (BENADRYL) 25 MG tablet Take 25 mg by mouth every 6 (six) hours as needed for allergies.    ?  diphenoxylate-atropine (LOMOTIL) 2.5-0.025 MG tablet Take 1 tablet by mouth 4 (four) times daily as needed for diarrhea or loose stools. 30 tablet 0  ? Dulaglutide (TRULICITY) 3 CV/8.9FY SOPN Inject 3 mg into the skin once a week. 6 mL 3  ? Evolocumab (REPATHA SURECLICK) 101 MG/ML SOAJ Inject 1 pen into the skin every 14 (fourteen) days. 2 mL 11  ? furosemide (LASIX) 40 MG tablet Take 1 tablet (40 mg total) by mouth daily. 180 tablet 3  ? glucose blood test strip Use as directed to test blood sugar 100 each 1  ? insulin glargine-yfgn (SEMGLEE) 100 UNIT/ML Pen Inject 25-40 Units into the skin daily based on blood sugar levels via sliding scale. 15 mL 11  ? Insulin Pen Needle (UNIFINE PENTIPS) 32G X 4 MM MISC USE AS DIRECTED ONCE A DAY 100 each 3  ? levothyroxine (SYNTHROID) 50 MCG tablet Take 1 tablet (50 mcg total) by mouth daily before breakfast. 90 tablet 0  ? lidocaine-prilocaine (EMLA) cream Apply to affected area once 30 g 3  ? magnesium oxide (MAG-OX) 400 MG tablet Take 400 mg by mouth at bedtime as needed (for cramps).    ? metFORMIN (GLUCOPHAGE) 1000 MG tablet Take 1 tablet (1,000 mg total) by mouth 2 (two) times daily with a meal. 180 tablet 3  ? Multiple Vitamin (MULITIVITAMIN WITH MINERALS) TABS Take 1 tablet by mouth daily with breakfast.    ? ondansetron (ZOFRAN) 8 MG tablet Take 1 tablet (8 mg total) by mouth 2 (two) times daily as needed. Start on the third day after chemotherapy. 30 tablet 1  ? OneTouch Delica Lancets 75Z MISC use as directed 100 each 1  ? prochlorperazine (COMPAZINE) 10 MG tablet Take 1 tablet (10 mg total) by mouth every 6 (six) hours as needed (Nausea or vomiting). 30 tablet 1  ? sacubitril-valsartan (ENTRESTO) 49-51 MG TAKE 1 TABLET BY MOUTH 2 (TWO) TIMES DAILY. 60 tablet 4  ? spironolactone (ALDACTONE) 25 MG tablet Patient is instructed to reduce to 1/2 tablet on 4/25 due to dehydration-NG 90 tablet 3  ? triamcinolone ointment (KENALOG) 0.1 % Apply 1 application topically two  times daily to affected area(s) as needed, sparing use to avoid whitening/thinning skin 30 g 1  ? ?No current facility-administered medications for this visit.  ? ? ?SUMMARY OF ONCOLOGIC HISTORY: ?Oncology History  ?Endometrial cancer [facility])  ?10/27/2021 Pathology Results  ? FINAL MICROSCOPIC DIAGNOSIS:  ? ?A. ENDOMETRIUM, BIOPSY:  ?- Endometrioid adenocarcinoma with focal secretory features.  ?- See comment.  ? ?COMMENT:  ?The carcinoma has endometrioid features with a few small foci with secretory features.  There are several foci of solid pattern and the findings are consistent with FIGO grade 1/2.  Immunohistochemistry shows diffuse strong positivity estrogen receptor and Napsin A is negative.  ?P53 shows wild-type pattern.  ?  ?11/07/2021 Initial Diagnosis  ? Endometrial cancer ([place]) ? ?  ?11/14/2021 Imaging  ? MRI pelvis ?1. Thickened (18 mm) endometrium, compatible with known endometrial malignancy. Evidence of full-thickness myometrial invasion by endometrial tumor throughout the left uterine body extending over 180 degrees of involvement. Probable early small focus of direct tumor invasion into the parametrial soft tissues in the anterior left uterine body as detailed. ?2. Suspect a combination of direct endometrial tumor involvement of the  upper endocervical canal and blood products within the endocervix. ?3. No pelvic lymphadenopathy.  No adnexal masses. ?  ?  ?  ?11/24/2021 Imaging  ? CT abdomen and pelvis ?No evidence of abdominal metastatic disease or other acute findings. ?  ?Mild hepatic steatosis. ?  ?Colonic diverticulosis, without radiographic evidence of diverticulitis. ?  ?Aortic Atherosclerosis (ICD10-I70.0). ?  ?  ?12/02/2021 Cancer Staging  ? Staging form: Corpus Uteri - Carcinoma and Carcinosarcoma, AJCC 8th Edition ?- Clinical stage from 12/02/2021: FIGO Stage IIIB (cT3b, cN0, cM0) - Signed by Heath Lark, MD on 12/02/2021 ?Stage prefix: Initial diagnosis ? ?  ?12/07/2021 Procedure  ? Status post  placement of right IJ port catheter ?  ?12/13/2021 -  Chemotherapy  ? Patient is on Treatment Plan : Uterine Cisplatin days 1 and 29 + XRT  ? ?  ?  ? ? ?PHYSICAL EXAMINATION: ?ECOG PERFORMANCE STATUS: 1 - Symptomatic but comple

## 2021-12-27 NOTE — Assessment & Plan Note (Signed)
We discussed the importance of dietary modification and low carbohydrate diet ?

## 2021-12-27 NOTE — Assessment & Plan Note (Signed)
I suspect her significant weight gain is due to fluid retention ?She will resume Lasix ?

## 2021-12-27 NOTE — Assessment & Plan Note (Signed)
Renal function has improved ?I suspect the weight she has gained in the past 7 days related to improvement of her fluid status when I withheld her furosemide ?She has mild leg edema on examination ?She will resume furosemide at 40 mg daily ?We discussed the importance of dietary modification and I will see her again next week for further follow-up ?

## 2021-12-28 ENCOUNTER — Other Ambulatory Visit: Payer: Self-pay

## 2021-12-28 ENCOUNTER — Other Ambulatory Visit (HOSPITAL_COMMUNITY): Payer: Self-pay

## 2021-12-28 ENCOUNTER — Ambulatory Visit
Admission: RE | Admit: 2021-12-28 | Discharge: 2021-12-28 | Disposition: A | Discharge status: Home / Self Care | Payer: 59 | Source: Ambulatory Visit | Attending: Radiation Oncology | Admitting: Radiation Oncology

## 2021-12-28 DIAGNOSIS — Z5111 Encounter for antineoplastic chemotherapy: Secondary | ICD-10-CM | POA: Diagnosis not present

## 2021-12-28 LAB — RAD ONC ARIA SESSION SUMMARY
Course Elapsed Days: 14
Plan Fractions Treated to Date: 11
Plan Prescribed Dose Per Fraction: 1.8 Gy
Plan Total Fractions Prescribed: 25
Plan Total Prescribed Dose: 45 Gy
Reference Point Dosage Given to Date: 19.8 Gy
Reference Point Session Dosage Given: 1.8 Gy
Session Number: 11

## 2021-12-29 ENCOUNTER — Telehealth: Payer: Self-pay

## 2021-12-29 ENCOUNTER — Other Ambulatory Visit (HOSPITAL_COMMUNITY): Payer: Self-pay

## 2021-12-29 ENCOUNTER — Ambulatory Visit
Admission: RE | Admit: 2021-12-29 | Discharge: 2021-12-29 | Disposition: A | Discharge status: Home / Self Care | Payer: 59 | Source: Ambulatory Visit | Attending: Radiation Oncology | Admitting: Radiation Oncology

## 2021-12-29 ENCOUNTER — Other Ambulatory Visit: Payer: Self-pay

## 2021-12-29 DIAGNOSIS — Z5111 Encounter for antineoplastic chemotherapy: Secondary | ICD-10-CM | POA: Diagnosis not present

## 2021-12-29 LAB — RAD ONC ARIA SESSION SUMMARY
Course Elapsed Days: 15
Plan Fractions Treated to Date: 12
Plan Prescribed Dose Per Fraction: 1.8 Gy
Plan Total Fractions Prescribed: 25
Plan Total Prescribed Dose: 45 Gy
Reference Point Dosage Given to Date: 21.6 Gy
Reference Point Session Dosage Given: 1.8 Gy
Session Number: 12

## 2021-12-29 NOTE — Telephone Encounter (Signed)
Notified Patient of prior authorization approval for Ondansetron '8mg'$  tablets. Medication is approved for 30 tablets every 15 days. Medication is approved 12/26/2021 through 12/27/2022. No other needs or concerns voice at this time. ?

## 2021-12-30 ENCOUNTER — Other Ambulatory Visit: Payer: Self-pay

## 2021-12-30 ENCOUNTER — Ambulatory Visit
Admission: RE | Admit: 2021-12-30 | Discharge: 2021-12-30 | Disposition: A | Discharge status: Home / Self Care | Payer: 59 | Source: Ambulatory Visit | Attending: Radiation Oncology | Admitting: Radiation Oncology

## 2021-12-30 ENCOUNTER — Other Ambulatory Visit (HOSPITAL_COMMUNITY): Payer: Self-pay

## 2021-12-30 DIAGNOSIS — Z5111 Encounter for antineoplastic chemotherapy: Secondary | ICD-10-CM | POA: Diagnosis not present

## 2021-12-30 LAB — RAD ONC ARIA SESSION SUMMARY
Course Elapsed Days: 16
Plan Fractions Treated to Date: 13
Plan Prescribed Dose Per Fraction: 1.8 Gy
Plan Total Fractions Prescribed: 25
Plan Total Prescribed Dose: 45 Gy
Reference Point Dosage Given to Date: 23.4 Gy
Reference Point Session Dosage Given: 1.8 Gy
Session Number: 13

## 2022-01-02 ENCOUNTER — Ambulatory Visit
Admission: RE | Admit: 2022-01-02 | Discharge: 2022-01-02 | Disposition: A | Discharge status: Home / Self Care | Payer: 59 | Source: Ambulatory Visit | Attending: Radiation Oncology | Admitting: Radiation Oncology

## 2022-01-02 ENCOUNTER — Inpatient Hospital Stay: Payer: 59

## 2022-01-02 ENCOUNTER — Other Ambulatory Visit: Payer: Self-pay

## 2022-01-02 DIAGNOSIS — C541 Malignant neoplasm of endometrium: Secondary | ICD-10-CM

## 2022-01-02 DIAGNOSIS — Z5111 Encounter for antineoplastic chemotherapy: Secondary | ICD-10-CM | POA: Diagnosis not present

## 2022-01-02 LAB — CBC WITH DIFFERENTIAL (CANCER CENTER ONLY)
Abs Immature Granulocytes: 0.01 10*3/uL (ref 0.00–0.07)
Basophils Absolute: 0 10*3/uL (ref 0.0–0.1)
Basophils Relative: 1 %
Eosinophils Absolute: 0.2 10*3/uL (ref 0.0–0.5)
Eosinophils Relative: 7 %
HCT: 34.9 % — ABNORMAL LOW (ref 36.0–46.0)
Hemoglobin: 11.6 g/dL — ABNORMAL LOW (ref 12.0–15.0)
Immature Granulocytes: 0 %
Lymphocytes Relative: 21 %
Lymphs Abs: 0.7 10*3/uL (ref 0.7–4.0)
MCH: 28.9 pg (ref 26.0–34.0)
MCHC: 33.2 g/dL (ref 30.0–36.0)
MCV: 86.8 fL (ref 80.0–100.0)
Monocytes Absolute: 0.4 10*3/uL (ref 0.1–1.0)
Monocytes Relative: 12 %
Neutro Abs: 1.9 10*3/uL (ref 1.7–7.7)
Neutrophils Relative %: 59 %
Platelet Count: 175 10*3/uL (ref 150–400)
RBC: 4.02 MIL/uL (ref 3.87–5.11)
RDW: 13.6 % (ref 11.5–15.5)
WBC Count: 3.2 10*3/uL — ABNORMAL LOW (ref 4.0–10.5)
nRBC: 0 % (ref 0.0–0.2)

## 2022-01-02 LAB — RAD ONC ARIA SESSION SUMMARY
Course Elapsed Days: 19
Plan Fractions Treated to Date: 14
Plan Prescribed Dose Per Fraction: 1.8 Gy
Plan Total Fractions Prescribed: 25
Plan Total Prescribed Dose: 45 Gy
Reference Point Dosage Given to Date: 25.2 Gy
Reference Point Session Dosage Given: 1.8 Gy
Session Number: 14

## 2022-01-02 LAB — BASIC METABOLIC PANEL - CANCER CENTER ONLY
Anion gap: 6 (ref 5–15)
BUN: 22 mg/dL (ref 8–23)
CO2: 26 mmol/L (ref 22–32)
Calcium: 9 mg/dL (ref 8.9–10.3)
Chloride: 103 mmol/L (ref 98–111)
Creatinine: 0.89 mg/dL (ref 0.44–1.00)
GFR, Estimated: >60 mL/min (ref >60)
Glucose, Bld: 218 mg/dL — ABNORMAL HIGH (ref 70–99)
Potassium: 4.8 mmol/L (ref 3.5–5.1)
Sodium: 135 mmol/L (ref 135–145)

## 2022-01-02 LAB — MAGNESIUM: Magnesium: 1.8 mg/dL (ref 1.7–2.4)

## 2022-01-02 MED ORDER — HEPARIN SOD (PORK) LOCK FLUSH 100 UNIT/ML IV SOLN
500.0000 [IU] | Freq: Once | INTRAVENOUS | Status: AC
  Administered 2022-01-02: 500 [IU]

## 2022-01-02 MED ORDER — SODIUM CHLORIDE 0.9% FLUSH
10.0000 mL | Freq: Once | INTRAVENOUS | Status: AC
  Administered 2022-01-02: 10 mL

## 2022-01-03 ENCOUNTER — Inpatient Hospital Stay (HOSPITAL_BASED_OUTPATIENT_CLINIC_OR_DEPARTMENT_OTHER): Payer: 59 | Admitting: Hematology and Oncology

## 2022-01-03 ENCOUNTER — Ambulatory Visit
Admission: RE | Admit: 2022-01-03 | Discharge: 2022-01-03 | Disposition: A | Discharge status: Home / Self Care | Payer: 59 | Source: Ambulatory Visit | Attending: Radiation Oncology | Admitting: Radiation Oncology

## 2022-01-03 ENCOUNTER — Other Ambulatory Visit: Payer: Self-pay

## 2022-01-03 ENCOUNTER — Encounter: Payer: Self-pay | Admitting: Hematology and Oncology

## 2022-01-03 DIAGNOSIS — Z5111 Encounter for antineoplastic chemotherapy: Secondary | ICD-10-CM | POA: Diagnosis not present

## 2022-01-03 DIAGNOSIS — D61818 Other pancytopenia: Secondary | ICD-10-CM

## 2022-01-03 DIAGNOSIS — I5022 Chronic systolic (congestive) heart failure: Secondary | ICD-10-CM | POA: Diagnosis not present

## 2022-01-03 DIAGNOSIS — C541 Malignant neoplasm of endometrium: Secondary | ICD-10-CM | POA: Diagnosis not present

## 2022-01-03 LAB — RAD ONC ARIA SESSION SUMMARY
Course Elapsed Days: 20
Plan Fractions Treated to Date: 15
Plan Prescribed Dose Per Fraction: 1.8 Gy
Plan Total Fractions Prescribed: 25
Plan Total Prescribed Dose: 45 Gy
Reference Point Dosage Given to Date: 27 Gy
Reference Point Session Dosage Given: 1.8 Gy
Session Number: 15

## 2022-01-03 NOTE — Assessment & Plan Note (Signed)
From the treatment perspective, she has developed mild pancytopenia but she is not symptomatic ?She will continue radiation as scheduled ?I plan to see her again next week prior to day 29 of chemotherapy ?We will continue aggressive supportive care ?

## 2022-01-03 NOTE — Progress Notes (Signed)
[place] ?OFFICE PROGRESS NOTE ? ?Patient Care Team: ?Early, Coralee Pesa, NP as PCP - General (Nurse Practitioner) ?Jettie Booze, MD as PCP - Cardiology (Cardiology) ?Rigoberto Noel, MD as Consulting Physician (Pulmonary Disease) ?Alphonsa Overall, MD (General Surgery) ?Awanda Mink Craige Cotta, RN as Oncology Nurse Navigator (Oncology) ?Heath Lark, MD as Consulting Physician (Hematology and Oncology) ? ?ASSESSMENT & PLAN:  ?Endometrial cancer ([place]) ?From the treatment perspective, she has developed mild pancytopenia but she is not symptomatic ?She will continue radiation as scheduled ?I plan to see her again next week prior to day 29 of chemotherapy ?We will continue aggressive supportive care ? 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 ?Chronic systolic CHF (congestive heart failure), NYHA class 3 ([place]) ?With resumption of furosemide, she have lost 3 pounds of fluid weight ?Her blood pressure is stable ?She will continue current medical management ? ?No orders of the defined types were placed in this encounter. ? ? ?All questions were answered. The patient knows to call the clinic with any problems, questions or concerns. ?The total time spent in the appointment was 20 minutes encounter with patients including review of chart and various tests results, discussions about plan of care and coordination of care plan ?  ?Heath Lark, MD ?01/03/2022 9:36 AM ? ?INTERVAL HISTORY: ?Please see below for problem oriented charting. ?she returns for treatment follow-up ?Since resumption of furosemide, she started to have moderate urination ?She has lost 3 pounds of fluid weight ?Denies recent changes in bowel habits or nausea ? ?REVIEW OF SYSTEMS:   ?Constitutional: Denies fevers, chills ?Eyes: Denies blurriness of vision ?Ears, nose, mouth, throat, and face: Denies mucositis or sore throat ?Respiratory: Denies cough, dyspnea or  wheezes ?Cardiovascular: Denies palpitation, chest discomfort or lower extremity swelling ?Gastrointestinal:  Denies nausea, heartburn or change in bowel habits ?Skin: Denies abnormal skin rashes ?Lymphatics: Denies new lymphadenopathy or easy bruising ?Neurological:Denies numbness, tingling or new weaknesses ?Behavioral/Psych: Mood is stable, no new changes  ?All other systems were reviewed with the patient and are negative. ? ?I have reviewed the past medical history, past surgical history, social history and family history with the patient and they are unchanged from previous note. ? ?ALLERGIES:  is allergic to amoxicillin, adhesive [tape], exenatide, invokana [canagliflozin], jardiance [empagliflozin], lisinopril, other, and statins. ? ?MEDICATIONS:  ?Current Outpatient Medications  ?Medication Sig Dispense Refill  ? acetaminophen (TYLENOL) 500 MG tablet Take 500 mg by mouth every 6 (six) hours as needed for moderate pain.    ? albuterol (PROAIR HFA) 108 (90 Base) MCG/ACT inhaler Inhale 2 puffs into the lungs every 6 (six) hours as needed. wheezing 18 g 2  ? aspirin 81 MG chewable tablet Chew 1 tablet (81 mg total) by mouth daily. 90 tablet 3  ? bismuth subsalicylate (PEPTO BISMOL) 262 MG chewable tablet Chew 524 mg by mouth as needed for indigestion or diarrhea or loose stools.     ? blood glucose meter kit and supplies Use up to four times daily as directed. (FOR ICD-10 E10.9, E11.9). 1 each 99  ? carvedilol (COREG) 12.5 MG tablet Patient was instructed to reduce to 1/2 tablet twice daily on 4/25 due to dehydration-NG 180 tablet 3  ? diphenhydrAMINE (BENADRYL) 25 MG tablet Take 25 mg by mouth every 6 (six) hours as needed for allergies.    ? diphenoxylate-atropine (LOMOTIL) 2.5-0.025 MG tablet Take 1 tablet by mouth 4 (four) times daily as needed  for diarrhea or loose stools. 30 tablet 0  ? Dulaglutide (TRULICITY) 3 HW/8.0SU SOPN Inject 3 mg into the skin once a week. 6 mL 3  ? Evolocumab (REPATHA SURECLICK)  110 MG/ML SOAJ Inject 1 pen into the skin every 14 (fourteen) days. 2 mL 11  ? furosemide (LASIX) 40 MG tablet Take 1 tablet (40 mg total) by mouth daily. 180 tablet 3  ? glucose blood test strip Use as directed to test blood sugar 100 each 1  ? insulin glargine-yfgn (SEMGLEE) 100 UNIT/ML Pen Inject 25-40 Units into the skin daily based on blood sugar levels via sliding scale. 15 mL 11  ? Insulin Pen Needle (UNIFINE PENTIPS) 32G X 4 MM MISC USE AS DIRECTED ONCE A DAY 100 each 3  ? levothyroxine (SYNTHROID) 50 MCG tablet Take 1 tablet (50 mcg total) by mouth daily before breakfast. 90 tablet 0  ? lidocaine-prilocaine (EMLA) cream Apply to affected area once 30 g 3  ? magnesium oxide (MAG-OX) 400 MG tablet Take 400 mg by mouth at bedtime as needed (for cramps).    ? metFORMIN (GLUCOPHAGE) 1000 MG tablet Take 1 tablet (1,000 mg total) by mouth 2 (two) times daily with a meal. 180 tablet 3  ? Multiple Vitamin (MULITIVITAMIN WITH MINERALS) TABS Take 1 tablet by mouth daily with breakfast.    ? ondansetron (ZOFRAN) 8 MG tablet Take 1 tablet (8 mg total) by mouth 2 (two) times daily as needed. Start on the third day after chemotherapy. 30 tablet 1  ? OneTouch Delica Lancets 31R MISC use as directed 100 each 1  ? prochlorperazine (COMPAZINE) 10 MG tablet Take 1 tablet (10 mg total) by mouth every 6 (six) hours as needed (Nausea or vomiting). 30 tablet 1  ? sacubitril-valsartan (ENTRESTO) 49-51 MG TAKE 1 TABLET BY MOUTH 2 (TWO) TIMES DAILY. 60 tablet 4  ? spironolactone (ALDACTONE) 25 MG tablet Patient is instructed to reduce to 1/2 tablet on 4/25 due to dehydration-NG 90 tablet 3  ? triamcinolone ointment (KENALOG) 0.1 % Apply 1 application topically two times daily to affected area(s) as needed, sparing use to avoid whitening/thinning skin 30 g 1  ? ?No current facility-administered medications for this visit.  ? ? ?SUMMARY OF ONCOLOGIC HISTORY: ?Oncology History  ?Endometrial cancer [facility])  ?10/27/2021 Pathology Results  ?  FINAL MICROSCOPIC DIAGNOSIS:  ? ?A. ENDOMETRIUM, BIOPSY:  ?- Endometrioid adenocarcinoma with focal secretory features.  ?- See comment.  ? ?COMMENT:  ?The carcinoma has endometrioid features with a few small foci with secretory features.  There are several foci of solid pattern and the findings are consistent with FIGO grade 1/2.  Immunohistochemistry shows diffuse strong positivity estrogen receptor and Napsin A is negative.  ?P53 shows wild-type pattern.  ?  ?11/07/2021 Initial Diagnosis  ? Endometrial cancer ([place]) ? ?  ?11/14/2021 Imaging  ? MRI pelvis ?1. Thickened (18 mm) endometrium, compatible with known endometrial malignancy. Evidence of full-thickness myometrial invasion by endometrial tumor throughout the left uterine body extending over 180 degrees of involvement. Probable early small focus of direct tumor invasion into the parametrial soft tissues in the anterior left uterine body as detailed. ?2. Suspect a combination of direct endometrial tumor involvement of the upper endocervical canal and blood products within the endocervix. ?3. No pelvic lymphadenopathy.  No adnexal masses. ?  ?  ?  ?11/24/2021 Imaging  ? CT abdomen and pelvis ?No evidence of abdominal metastatic disease or other acute findings. ?  ?Mild hepatic steatosis. ?  ?Colonic  diverticulosis, without radiographic evidence of diverticulitis. ?  ?Aortic Atherosclerosis (ICD10-I70.0). ?  ?  ?12/02/2021 Cancer Staging  ? Staging form: Corpus Uteri - Carcinoma and Carcinosarcoma, AJCC 8th Edition ?- Clinical stage from 12/02/2021: FIGO Stage IIIB (cT3b, cN0, cM0) - Signed by Heath Lark, MD on 12/02/2021 ?Stage prefix: Initial diagnosis ? ?  ?12/07/2021 Procedure  ? Status post placement of right IJ port catheter ?  ?12/13/2021 -  Chemotherapy  ? Patient is on Treatment Plan : Uterine Cisplatin days 1 and 29 + XRT  ? ?  ?  ? ? ?PHYSICAL EXAMINATION: ?ECOG PERFORMANCE STATUS: 1 - Symptomatic but completely ambulatory ? ?Vitals:  ? 01/03/22 0837  ?BP:  109/71  ?Pulse: (!) 103  ?Resp: 18  ?Temp: (!) 97.3 ?F (36.3 ?C)  ?SpO2: 98%  ? ?Filed Weights  ? 01/03/22 0837  ?Weight: 296 lb (134.3 kg)  ? ? ?GENERAL:alert, no distress and comfortable ?NEURO: alert & oriented x 3 wi

## 2022-01-03 NOTE — Assessment & Plan Note (Signed)
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

## 2022-01-03 NOTE — Assessment & Plan Note (Signed)
With resumption of furosemide, she have lost 3 pounds of fluid weight ?Her blood pressure is stable ?She will continue current medical management ?

## 2022-01-04 ENCOUNTER — Other Ambulatory Visit: Payer: Self-pay

## 2022-01-04 ENCOUNTER — Ambulatory Visit
Admission: RE | Admit: 2022-01-04 | Discharge: 2022-01-04 | Disposition: A | Discharge status: Home / Self Care | Payer: 59 | Source: Ambulatory Visit | Attending: Radiation Oncology | Admitting: Radiation Oncology

## 2022-01-04 DIAGNOSIS — Z5111 Encounter for antineoplastic chemotherapy: Secondary | ICD-10-CM | POA: Diagnosis not present

## 2022-01-04 LAB — RAD ONC ARIA SESSION SUMMARY
Course Elapsed Days: 21
Plan Fractions Treated to Date: 16
Plan Prescribed Dose Per Fraction: 1.8 Gy
Plan Total Fractions Prescribed: 25
Plan Total Prescribed Dose: 45 Gy
Reference Point Dosage Given to Date: 28.8 Gy
Reference Point Session Dosage Given: 1.8 Gy
Session Number: 16

## 2022-01-05 ENCOUNTER — Other Ambulatory Visit: Payer: Self-pay | Admitting: Oncology

## 2022-01-05 ENCOUNTER — Ambulatory Visit
Admission: RE | Admit: 2022-01-05 | Discharge: 2022-01-05 | Disposition: A | Discharge status: Home / Self Care | Payer: 59 | Source: Ambulatory Visit | Attending: Radiation Oncology | Admitting: Radiation Oncology

## 2022-01-05 ENCOUNTER — Other Ambulatory Visit: Payer: Self-pay

## 2022-01-05 ENCOUNTER — Other Ambulatory Visit (HOSPITAL_BASED_OUTPATIENT_CLINIC_OR_DEPARTMENT_OTHER): Payer: Self-pay | Admitting: Nurse Practitioner

## 2022-01-05 ENCOUNTER — Other Ambulatory Visit (HOSPITAL_COMMUNITY): Payer: Self-pay

## 2022-01-05 ENCOUNTER — Telehealth: Payer: Self-pay | Admitting: Oncology

## 2022-01-05 DIAGNOSIS — C541 Malignant neoplasm of endometrium: Secondary | ICD-10-CM

## 2022-01-05 DIAGNOSIS — Z5111 Encounter for antineoplastic chemotherapy: Secondary | ICD-10-CM | POA: Diagnosis not present

## 2022-01-05 DIAGNOSIS — E1165 Type 2 diabetes mellitus with hyperglycemia: Secondary | ICD-10-CM

## 2022-01-05 LAB — RAD ONC ARIA SESSION SUMMARY
Course Elapsed Days: 22
Plan Fractions Treated to Date: 17
Plan Prescribed Dose Per Fraction: 1.8 Gy
Plan Total Fractions Prescribed: 25
Plan Total Prescribed Dose: 45 Gy
Reference Point Dosage Given to Date: 30.6 Gy
Reference Point Session Dosage Given: 1.8 Gy
Session Number: 17

## 2022-01-05 MED ORDER — INSULIN GLARGINE-YFGN 100 UNIT/ML ~~LOC~~ SOPN
25.0000 [IU] | PEN_INJECTOR | Freq: Every day | SUBCUTANEOUS | 11 refills | Status: DC
  Filled 2022-01-05: qty 15, 37d supply, fill #0

## 2022-01-05 NOTE — Telephone Encounter (Signed)
Called Terri Heath and advised her of MRI appointment on 01/30/22 at 10:00 with 9:30 arrival at [facility].  She verbalized understanding and agreement. ?

## 2022-01-05 NOTE — Progress Notes (Signed)
MRI pelvis order placed. ?

## 2022-01-06 ENCOUNTER — Ambulatory Visit
Admission: RE | Admit: 2022-01-06 | Discharge: 2022-01-06 | Disposition: A | Discharge status: Home / Self Care | Payer: 59 | Source: Ambulatory Visit | Attending: Radiation Oncology | Admitting: Radiation Oncology

## 2022-01-06 ENCOUNTER — Other Ambulatory Visit: Payer: Self-pay

## 2022-01-06 DIAGNOSIS — Z5111 Encounter for antineoplastic chemotherapy: Secondary | ICD-10-CM | POA: Diagnosis not present

## 2022-01-06 LAB — RAD ONC ARIA SESSION SUMMARY
Course Elapsed Days: 23
Plan Fractions Treated to Date: 18
Plan Prescribed Dose Per Fraction: 1.8 Gy
Plan Total Fractions Prescribed: 25
Plan Total Prescribed Dose: 45 Gy
Reference Point Dosage Given to Date: 32.4 Gy
Reference Point Session Dosage Given: 1.8 Gy
Session Number: 18

## 2022-01-09 ENCOUNTER — Other Ambulatory Visit: Payer: Self-pay

## 2022-01-09 ENCOUNTER — Other Ambulatory Visit (HOSPITAL_COMMUNITY): Payer: Self-pay

## 2022-01-09 ENCOUNTER — Ambulatory Visit
Admission: RE | Admit: 2022-01-09 | Discharge: 2022-01-09 | Disposition: A | Discharge status: Home / Self Care | Payer: 59 | Source: Ambulatory Visit | Attending: Radiation Oncology | Admitting: Radiation Oncology

## 2022-01-09 ENCOUNTER — Inpatient Hospital Stay: Payer: 59

## 2022-01-09 ENCOUNTER — Encounter: Payer: Self-pay | Admitting: Hematology and Oncology

## 2022-01-09 DIAGNOSIS — Z5111 Encounter for antineoplastic chemotherapy: Secondary | ICD-10-CM | POA: Diagnosis not present

## 2022-01-09 DIAGNOSIS — C541 Malignant neoplasm of endometrium: Secondary | ICD-10-CM

## 2022-01-09 LAB — CBC WITH DIFFERENTIAL (CANCER CENTER ONLY)
Abs Immature Granulocytes: 0.01 10*3/uL (ref 0.00–0.07)
Basophils Absolute: 0 10*3/uL (ref 0.0–0.1)
Basophils Relative: 1 %
Eosinophils Absolute: 0.2 10*3/uL (ref 0.0–0.5)
Eosinophils Relative: 7 %
HCT: 33.5 % — ABNORMAL LOW (ref 36.0–46.0)
Hemoglobin: 11.5 g/dL — ABNORMAL LOW (ref 12.0–15.0)
Immature Granulocytes: 0 %
Lymphocytes Relative: 15 %
Lymphs Abs: 0.5 10*3/uL — ABNORMAL LOW (ref 0.7–4.0)
MCH: 29.4 pg (ref 26.0–34.0)
MCHC: 34.3 g/dL (ref 30.0–36.0)
MCV: 85.7 fL (ref 80.0–100.0)
Monocytes Absolute: 0.3 10*3/uL (ref 0.1–1.0)
Monocytes Relative: 8 %
Neutro Abs: 2.4 10*3/uL (ref 1.7–7.7)
Neutrophils Relative %: 69 %
Platelet Count: 212 10*3/uL (ref 150–400)
RBC: 3.91 MIL/uL (ref 3.87–5.11)
RDW: 14.1 % (ref 11.5–15.5)
WBC Count: 3.5 10*3/uL — ABNORMAL LOW (ref 4.0–10.5)
nRBC: 0 % (ref 0.0–0.2)

## 2022-01-09 LAB — RAD ONC ARIA SESSION SUMMARY
Course Elapsed Days: 26
Plan Fractions Treated to Date: 19
Plan Prescribed Dose Per Fraction: 1.8 Gy
Plan Total Fractions Prescribed: 25
Plan Total Prescribed Dose: 45 Gy
Reference Point Dosage Given to Date: 34.2 Gy
Reference Point Session Dosage Given: 1.8 Gy
Session Number: 19

## 2022-01-09 LAB — BASIC METABOLIC PANEL - CANCER CENTER ONLY
Anion gap: 7 (ref 5–15)
BUN: 17 mg/dL (ref 8–23)
CO2: 26 mmol/L (ref 22–32)
Calcium: 8.7 mg/dL — ABNORMAL LOW (ref 8.9–10.3)
Chloride: 100 mmol/L (ref 98–111)
Creatinine: 0.93 mg/dL (ref 0.44–1.00)
GFR, Estimated: >60 mL/min (ref >60)
Glucose, Bld: 173 mg/dL — ABNORMAL HIGH (ref 70–99)
Potassium: 4.5 mmol/L (ref 3.5–5.1)
Sodium: 133 mmol/L — ABNORMAL LOW (ref 135–145)

## 2022-01-09 LAB — MAGNESIUM: Magnesium: 1.8 mg/dL (ref 1.7–2.4)

## 2022-01-09 MED ORDER — SODIUM CHLORIDE 0.9% FLUSH
10.0000 mL | Freq: Once | INTRAVENOUS | Status: AC
  Administered 2022-01-09: 10 mL

## 2022-01-09 MED ORDER — HEPARIN SOD (PORK) LOCK FLUSH 100 UNIT/ML IV SOLN
500.0000 [IU] | Freq: Once | INTRAVENOUS | Status: AC
  Administered 2022-01-09: 500 [IU]

## 2022-01-09 MED FILL — Dexamethasone Sodium Phosphate Inj 100 MG/10ML: INTRAMUSCULAR | Qty: 1 | Status: AC

## 2022-01-09 MED FILL — Fosaprepitant Dimeglumine For IV Infusion 150 MG (Base Eq): INTRAVENOUS | Qty: 5 | Status: AC

## 2022-01-10 ENCOUNTER — Inpatient Hospital Stay: Payer: 59

## 2022-01-10 ENCOUNTER — Inpatient Hospital Stay (HOSPITAL_BASED_OUTPATIENT_CLINIC_OR_DEPARTMENT_OTHER): Payer: 59 | Admitting: Hematology and Oncology

## 2022-01-10 ENCOUNTER — Encounter: Payer: Self-pay | Admitting: Hematology and Oncology

## 2022-01-10 ENCOUNTER — Ambulatory Visit
Admission: RE | Admit: 2022-01-10 | Discharge: 2022-01-10 | Disposition: A | Discharge status: Home / Self Care | Payer: 59 | Source: Ambulatory Visit | Attending: Radiation Oncology | Admitting: Radiation Oncology

## 2022-01-10 ENCOUNTER — Other Ambulatory Visit: Payer: Self-pay

## 2022-01-10 VITALS — BP 92/67

## 2022-01-10 DIAGNOSIS — C541 Malignant neoplasm of endometrium: Secondary | ICD-10-CM

## 2022-01-10 DIAGNOSIS — E1142 Type 2 diabetes mellitus with diabetic polyneuropathy: Secondary | ICD-10-CM | POA: Diagnosis not present

## 2022-01-10 DIAGNOSIS — Z5111 Encounter for antineoplastic chemotherapy: Secondary | ICD-10-CM | POA: Diagnosis not present

## 2022-01-10 DIAGNOSIS — D61818 Other pancytopenia: Secondary | ICD-10-CM

## 2022-01-10 LAB — RAD ONC ARIA SESSION SUMMARY
Course Elapsed Days: 27
Plan Fractions Treated to Date: 20
Plan Prescribed Dose Per Fraction: 1.8 Gy
Plan Total Fractions Prescribed: 25
Plan Total Prescribed Dose: 45 Gy
Reference Point Dosage Given to Date: 36 Gy
Reference Point Session Dosage Given: 1.8 Gy
Session Number: 20

## 2022-01-10 MED ORDER — POTASSIUM CHLORIDE IN NACL 20-0.9 MEQ/L-% IV SOLN
Freq: Once | INTRAVENOUS | Status: AC
  Filled 2022-01-10: qty 1000

## 2022-01-10 MED ORDER — SODIUM CHLORIDE 0.9% FLUSH
10.0000 mL | INTRAVENOUS | Status: DC | PRN
  Administered 2022-01-10: 10 mL

## 2022-01-10 MED ORDER — SODIUM CHLORIDE 0.9 % IV SOLN
39.5000 mg/m2 | Freq: Once | INTRAVENOUS | Status: AC
  Administered 2022-01-10: 100 mg via INTRAVENOUS
  Filled 2022-01-10: qty 100

## 2022-01-10 MED ORDER — PALONOSETRON HCL INJECTION 0.25 MG/5ML
0.2500 mg | Freq: Once | INTRAVENOUS | Status: AC
  Administered 2022-01-10: 0.25 mg via INTRAVENOUS
  Filled 2022-01-10: qty 5

## 2022-01-10 MED ORDER — SODIUM CHLORIDE 0.9 % IV SOLN
150.0000 mg | Freq: Once | INTRAVENOUS | Status: AC
  Administered 2022-01-10: 150 mg via INTRAVENOUS
  Filled 2022-01-10: qty 150

## 2022-01-10 MED ORDER — SODIUM CHLORIDE 0.9 % IV SOLN
10.0000 mg | Freq: Once | INTRAVENOUS | Status: AC
  Administered 2022-01-10: 10 mg via INTRAVENOUS
  Filled 2022-01-10: qty 10

## 2022-01-10 MED ORDER — SODIUM CHLORIDE 0.9 % IV SOLN: Freq: Once | INTRAVENOUS | Status: AC

## 2022-01-10 MED ORDER — MAGNESIUM SULFATE 2 GM/50ML IV SOLN
2.0000 g | Freq: Once | INTRAVENOUS | Status: AC
  Administered 2022-01-10: 2 g via INTRAVENOUS
  Filled 2022-01-10: qty 50

## 2022-01-10 MED ORDER — HEPARIN SOD (PORK) LOCK FLUSH 100 UNIT/ML IV SOLN
500.0000 [IU] | Freq: Once | INTRAVENOUS | Status: AC | PRN
  Administered 2022-01-10: 500 [IU]

## 2022-01-10 NOTE — Assessment & Plan Note (Signed)
We discussed the importance of dietary modification and low carbohydrate diet ?Recent blood sugar is elevated but so far her kidney function remain preserved and she has no worsening neuropathy ?She will continue dietary modification ?

## 2022-01-10 NOTE — Assessment & Plan Note (Signed)
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

## 2022-01-10 NOTE — Assessment & Plan Note (Signed)
She has intermittent abdominal cramping ?She will proceed with second dose of chemotherapy today ?She is scheduled for repeat imaging study within the next month for objective assessment of response to therapy ?

## 2022-01-10 NOTE — Patient Instructions (Signed)
[facility]  Discharge Instructions: Thank you for choosing [facility] to provide your oncology and hematology care.   If you have a lab appointment with the Cancer Center, please go directly to the Cancer Center and check in at the registration area.   Wear comfortable clothing and clothing appropriate for easy access to any Portacath or PICC line.   We strive to give you quality time with your provider. You may need to reschedule your appointment if you arrive late (15 or more minutes).  Arriving late affects you and other patients whose appointments are after yours.  Also, if you miss three or more appointments without notifying the office, you may be dismissed from the clinic at the provider's discretion.      For prescription refill requests, have your pharmacy contact our office and allow 72 hours for refills to be completed.    Today you received the following chemotherapy and/or immunotherapy agents : Cisplatin    To help prevent nausea and vomiting after your treatment, we encourage you to take your nausea medication as directed.  BELOW ARE SYMPTOMS THAT SHOULD BE REPORTED IMMEDIATELY: *FEVER GREATER THAN 100.4 F (38 C) OR HIGHER *CHILLS OR SWEATING *NAUSEA AND VOMITING THAT IS NOT CONTROLLED WITH YOUR NAUSEA MEDICATION *UNUSUAL SHORTNESS OF BREATH *UNUSUAL BRUISING OR BLEEDING *URINARY PROBLEMS (pain or burning when urinating, or frequent urination) *BOWEL PROBLEMS (unusual diarrhea, constipation, pain near the anus) TENDERNESS IN MOUTH AND THROAT WITH OR WITHOUT PRESENCE OF ULCERS (sore throat, sores in mouth, or a toothache) UNUSUAL RASH, SWELLING OR PAIN  UNUSUAL VAGINAL DISCHARGE OR ITCHING   Items with * indicate a potential emergency and should be followed up as soon as possible or go to the Emergency Department if any problems should occur.  Please show the CHEMOTHERAPY ALERT CARD or IMMUNOTHERAPY ALERT CARD at check-in to  the Emergency Department and triage nurse.  Should you have questions after your visit or need to cancel or reschedule your appointment, please contact [facility]  Dept: [phone]  and follow the prompts.  Office hours are 8:00 a.m. to 4:30 p.m. Monday - Friday. Please note that voicemails left after 4:00 p.m. may not be returned until the following business day.  We are closed weekends and major holidays. You have access to a nurse at all times for urgent questions. Please call the main number to the clinic Dept: [phone] and follow the prompts.   For any non-urgent questions, you may also contact your provider using MyChart. We now offer e-Visits for anyone 18 and older to request care online for non-urgent symptoms. For details visit [email or web address].   Also download the MyChart app! Go to the app store, search "MyChart", open the app, select , and log in with your MyChart username and password.  Due to Covid, a mask is required upon entering the hospital/clinic. If you do not have a mask, one will be given to you upon arrival. For doctor visits, patients may have 1 support person aged 18 or older with them. For treatment visits, patients cannot have anyone with them due to current Covid guidelines and our immunocompromised population.   

## 2022-01-10 NOTE — Progress Notes (Signed)
[place] ?OFFICE PROGRESS NOTE ? ?Patient Care Team: ?Early, Coralee Pesa, NP as PCP - General (Nurse Practitioner) ?Jettie Booze, MD as PCP - Cardiology (Cardiology) ?Rigoberto Noel, MD as Consulting Physician (Pulmonary Disease) ?Alphonsa Overall, MD (General Surgery) ?Awanda Mink Craige Cotta, RN as Oncology Nurse Navigator (Oncology) ?Heath Lark, MD as Consulting Physician (Hematology and Oncology) ? ?ASSESSMENT & PLAN:  ?Endometrial cancer ([place]) ?She has intermittent abdominal cramping ?She will proceed with second dose of chemotherapy today ?She is scheduled for repeat imaging study within the next month for objective assessment of response to therapy ? 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 ?Diabetic peripheral neuropathy associated with type 2 diabetes mellitus ([place]) ?We discussed the importance of dietary modification and low carbohydrate diet ?Recent blood sugar is elevated but so far her kidney function remain preserved and she has no worsening neuropathy ?She will continue dietary modification ? ?No orders of the defined types were placed in this encounter. ? ? ?All questions were answered. The patient knows to call the clinic with any problems, questions or concerns. ?The total time spent in the appointment was 20 minutes encounter with patients including review of chart and various tests results, discussions about plan of care and coordination of care plan ?  ?Heath Lark, MD ?01/10/2022 9:35 AM ? ?INTERVAL HISTORY: ?Please see below for problem oriented charting. ?she returns for treatment follow-up seen prior to cycle 2 of cisplatin ?She denies new symptoms since last time I saw her ?She has intermittent abdominal cramping but nothing major ?Denies recent nausea or changes in bowel habits ?Given though her blood pressure is low, she is not symptomatic ?She is trying to drink lots of fluids ? ?REVIEW OF SYSTEMS:    ?Constitutional: Denies fevers, chills or abnormal weight loss ?Eyes: Denies blurriness of vision ?Ears, nose, mouth, throat, and face: Denies mucositis or sore throat ?Respiratory: Denies cough, dyspnea or wheezes ?Cardiovascular: Denies palpitation, chest discomfort or lower extremity swelling ?Gastrointestinal:  Denies nausea, heartburn or change in bowel habits ?Skin: Denies abnormal skin rashes ?Lymphatics: Denies new lymphadenopathy or easy bruising ?Neurological:Denies numbness, tingling or new weaknesses ?Behavioral/Psych: Mood is stable, no new changes  ?All other systems were reviewed with the patient and are negative. ? ?I have reviewed the past medical history, past surgical history, social history and family history with the patient and they are unchanged from previous note. ? ?ALLERGIES:  is allergic to amoxicillin, adhesive [tape], exenatide, invokana [canagliflozin], jardiance [empagliflozin], lisinopril, other, and statins. ? ?MEDICATIONS:  ?Current Outpatient Medications  ?Medication Sig Dispense Refill  ? acetaminophen (TYLENOL) 500 MG tablet Take 500 mg by mouth every 6 (six) hours as needed for moderate pain.    ? albuterol (PROAIR HFA) 108 (90 Base) MCG/ACT inhaler Inhale 2 puffs into the lungs every 6 (six) hours as needed. wheezing 18 g 2  ? aspirin 81 MG chewable tablet Chew 1 tablet (81 mg total) by mouth daily. 90 tablet 3  ? bismuth subsalicylate (PEPTO BISMOL) 262 MG chewable tablet Chew 524 mg by mouth as needed for indigestion or diarrhea or loose stools.     ? blood glucose meter kit and supplies Use up to four times daily as directed. (FOR ICD-10 E10.9, E11.9). 1 each 99  ? carvedilol (COREG) 12.5 MG tablet Patient was instructed to reduce to 1/2 tablet twice daily on 4/25 due to dehydration-NG 180 tablet 3  ? diphenhydrAMINE (BENADRYL) 25 MG  tablet Take 25 mg by mouth every 6 (six) hours as needed for allergies.    ? diphenoxylate-atropine (LOMOTIL) 2.5-0.025 MG tablet Take 1  tablet by mouth 4 (four) times daily as needed for diarrhea or loose stools. 30 tablet 0  ? Dulaglutide (TRULICITY) 3 UR/4.2HC SOPN Inject 3 mg into the skin once a week. 6 mL 3  ? Evolocumab (REPATHA SURECLICK) 623 MG/ML SOAJ Inject 1 pen into the skin every 14 (fourteen) days. 2 mL 11  ? furosemide (LASIX) 40 MG tablet Take 1 tablet (40 mg total) by mouth daily. 180 tablet 3  ? glucose blood test strip Use as directed to test blood sugar 100 each 1  ? insulin glargine-yfgn (SEMGLEE, YFGN,) 100 UNIT/ML Pen Inject 25-40 Units into the skin daily based on blood sugar levels via sliding scale. 15 mL 11  ? Insulin Pen Needle (UNIFINE PENTIPS) 32G X 4 MM MISC USE AS DIRECTED ONCE A DAY 100 each 3  ? levothyroxine (SYNTHROID) 50 MCG tablet Take 1 tablet (50 mcg total) by mouth daily before breakfast. 90 tablet 0  ? lidocaine-prilocaine (EMLA) cream Apply to affected area once 30 g 3  ? magnesium oxide (MAG-OX) 400 MG tablet Take 400 mg by mouth at bedtime as needed (for cramps).    ? metFORMIN (GLUCOPHAGE) 1000 MG tablet Take 1 tablet (1,000 mg total) by mouth 2 (two) times daily with a meal. 180 tablet 3  ? Multiple Vitamin (MULITIVITAMIN WITH MINERALS) TABS Take 1 tablet by mouth daily with breakfast.    ? ondansetron (ZOFRAN) 8 MG tablet Take 1 tablet (8 mg total) by mouth 2 (two) times daily as needed. Start on the third day after chemotherapy. 30 tablet 1  ? OneTouch Delica Lancets 76E MISC use as directed 100 each 1  ? prochlorperazine (COMPAZINE) 10 MG tablet Take 1 tablet (10 mg total) by mouth every 6 (six) hours as needed (Nausea or vomiting). 30 tablet 1  ? sacubitril-valsartan (ENTRESTO) 49-51 MG TAKE 1 TABLET BY MOUTH 2 (TWO) TIMES DAILY. 60 tablet 4  ? spironolactone (ALDACTONE) 25 MG tablet Patient is instructed to reduce to 1/2 tablet on 4/25 due to dehydration-NG 90 tablet 3  ? triamcinolone ointment (KENALOG) 0.1 % Apply 1 application topically two times daily to affected area(s) as needed, sparing use  to avoid whitening/thinning skin 30 g 1  ? ?No current facility-administered medications for this visit.  ? ?Facility-Administered Medications Ordered in Other Visits  ?Medication Dose Route Frequency Provider Last Rate Last Admin  ? 0.9 %  sodium chloride infusion   Intravenous Once Alvy Bimler, Elora Wolter, MD      ? 0.9 % NaCl with KCl 20 mEq/ L  infusion   Intravenous Once Alvy Bimler, Colleene Swarthout, MD      ? CISplatin (PLATINOL) 101 mg in sodium chloride 0.9 % 500 mL chemo infusion  40 mg/m2 (Treatment Plan Recorded) Intravenous Once Heath Lark, MD      ? dexamethasone (DECADRON) 10 mg in sodium chloride 0.9 % 50 mL IVPB  10 mg Intravenous Once Alvy Bimler, Eloisa Chokshi, MD      ? fosaprepitant (EMEND) 150 mg in sodium chloride 0.9 % 145 mL IVPB  150 mg Intravenous Once Alvy Bimler, Alayha Babineaux, MD      ? heparin lock flush 100 unit/mL  500 Units Intracatheter Once PRN Alvy Bimler, Travor Royce, MD      ? magnesium sulfate IVPB 2 g 50 mL  2 g Intravenous Once Heath Lark, MD      ? palonosetron (  ALOXI) injection 0.25 mg  0.25 mg Intravenous Once Xzavior Reinig, MD      ? sodium chloride flush (NS) 0.9 % injection 10 mL  10 mL Intracatheter PRN Heath Lark, MD      ? ? ?SUMMARY OF ONCOLOGIC HISTORY: ?Oncology History  ?Endometrial cancer [facility])  ?10/27/2021 Pathology Results  ? FINAL MICROSCOPIC DIAGNOSIS:  ? ?A. ENDOMETRIUM, BIOPSY:  ?- Endometrioid adenocarcinoma with focal secretory features.  ?- See comment.  ? ?COMMENT:  ?The carcinoma has endometrioid features with a few small foci with secretory features.  There are several foci of solid pattern and the findings are consistent with FIGO grade 1/2.  Immunohistochemistry shows diffuse strong positivity estrogen receptor and Napsin A is negative.  ?P53 shows wild-type pattern.  ?  ?11/07/2021 Initial Diagnosis  ? Endometrial cancer ([place]) ? ?  ?11/14/2021 Imaging  ? MRI pelvis ?1. Thickened (18 mm) endometrium, compatible with known endometrial malignancy. Evidence of full-thickness myometrial invasion by endometrial tumor  throughout the left uterine body extending over 180 degrees of involvement. Probable early small focus of direct tumor invasion into the parametrial soft tissues in the anterior left uterine body as detailed. ?2. Sus

## 2022-01-11 ENCOUNTER — Other Ambulatory Visit: Payer: Self-pay

## 2022-01-11 ENCOUNTER — Telehealth: Payer: Self-pay

## 2022-01-11 ENCOUNTER — Ambulatory Visit
Admission: RE | Admit: 2022-01-11 | Discharge: 2022-01-11 | Disposition: A | Discharge status: Home / Self Care | Payer: 59 | Source: Ambulatory Visit | Attending: Radiation Oncology | Admitting: Radiation Oncology

## 2022-01-11 DIAGNOSIS — Z5111 Encounter for antineoplastic chemotherapy: Secondary | ICD-10-CM | POA: Diagnosis not present

## 2022-01-11 LAB — RAD ONC ARIA SESSION SUMMARY
Course Elapsed Days: 28
Plan Fractions Treated to Date: 21
Plan Prescribed Dose Per Fraction: 1.8 Gy
Plan Total Fractions Prescribed: 25
Plan Total Prescribed Dose: 45 Gy
Reference Point Dosage Given to Date: 37.8 Gy
Reference Point Session Dosage Given: 1.8 Gy
Session Number: 21

## 2022-01-11 NOTE — Telephone Encounter (Signed)
Called patient to make aware of pre op evaluation  appointment with  Dr. Aundra Dubin on 01/12/22 '@10am'$ . Patient voiced understanding patient also given code to enter parking deck,  ?

## 2022-01-12 ENCOUNTER — Other Ambulatory Visit (HOSPITAL_COMMUNITY): Payer: Self-pay

## 2022-01-12 ENCOUNTER — Other Ambulatory Visit: Payer: Self-pay

## 2022-01-12 ENCOUNTER — Ambulatory Visit (HOSPITAL_BASED_OUTPATIENT_CLINIC_OR_DEPARTMENT_OTHER)
Admission: RE | Admit: 2022-01-12 | Discharge: 2022-01-12 | Disposition: A | Discharge status: Home / Self Care | Payer: 59 | Source: Ambulatory Visit | Attending: Cardiology | Admitting: Cardiology

## 2022-01-12 ENCOUNTER — Ambulatory Visit
Admission: RE | Admit: 2022-01-12 | Discharge: 2022-01-12 | Disposition: A | Discharge status: Home / Self Care | Payer: 59 | Source: Ambulatory Visit | Attending: Radiation Oncology | Admitting: Radiation Oncology

## 2022-01-12 ENCOUNTER — Encounter (HOSPITAL_COMMUNITY): Payer: Self-pay | Admitting: Cardiology

## 2022-01-12 VITALS — BP 124/78 | HR 82 | Wt 302.4 lb

## 2022-01-12 DIAGNOSIS — E669 Obesity, unspecified: Secondary | ICD-10-CM | POA: Insufficient documentation

## 2022-01-12 DIAGNOSIS — D472 Monoclonal gammopathy: Secondary | ICD-10-CM | POA: Insufficient documentation

## 2022-01-12 DIAGNOSIS — I252 Old myocardial infarction: Secondary | ICD-10-CM | POA: Insufficient documentation

## 2022-01-12 DIAGNOSIS — C55 Malignant neoplasm of uterus, part unspecified: Secondary | ICD-10-CM | POA: Insufficient documentation

## 2022-01-12 DIAGNOSIS — I2510 Atherosclerotic heart disease of native coronary artery without angina pectoris: Secondary | ICD-10-CM | POA: Insufficient documentation

## 2022-01-12 DIAGNOSIS — D61818 Other pancytopenia: Secondary | ICD-10-CM | POA: Diagnosis not present

## 2022-01-12 DIAGNOSIS — C541 Malignant neoplasm of endometrium: Secondary | ICD-10-CM | POA: Diagnosis not present

## 2022-01-12 DIAGNOSIS — I5022 Chronic systolic (congestive) heart failure: Secondary | ICD-10-CM | POA: Insufficient documentation

## 2022-01-12 DIAGNOSIS — Z79899 Other long term (current) drug therapy: Secondary | ICD-10-CM | POA: Insufficient documentation

## 2022-01-12 DIAGNOSIS — Z7982 Long term (current) use of aspirin: Secondary | ICD-10-CM | POA: Insufficient documentation

## 2022-01-12 DIAGNOSIS — Z5111 Encounter for antineoplastic chemotherapy: Secondary | ICD-10-CM | POA: Diagnosis not present

## 2022-01-12 LAB — RAD ONC ARIA SESSION SUMMARY
Course Elapsed Days: 29
Plan Fractions Treated to Date: 22
Plan Prescribed Dose Per Fraction: 1.8 Gy
Plan Total Fractions Prescribed: 25
Plan Total Prescribed Dose: 45 Gy
Reference Point Dosage Given to Date: 39.6 Gy
Reference Point Session Dosage Given: 1.8 Gy
Session Number: 22

## 2022-01-12 MED ORDER — SPIRONOLACTONE 25 MG PO TABS
25.0000 mg | ORAL_TABLET | Freq: Every day | ORAL | 3 refills | Status: DC
  Filled 2022-01-12: qty 90, 90d supply, fill #0

## 2022-01-12 NOTE — Patient Instructions (Addendum)
Increase your spitonolactone to 25 mg daily.  Your physician has requested that you have an echocardiogram. Echocardiography is a painless test that uses sound waves to create images of your heart. It provides your doctor with information about the size and shape of your heart and how well your heart's chambers and valves are working. This procedure takes approximately one hour. There are no restrictions for this procedure.  Your physician recommends that you schedule a follow-up appointment in: 5 months with echocardiogram (October 2023) ** please call the office in July to arrange your follow up appointment **  If you have any questions or concerns before your next appointment please send [place] a message through [place] or call our office at [phone].    TO LEAVE A MESSAGE FOR THE NURSE SELECT OPTION 2, PLEASE LEAVE A MESSAGE INCLUDING: YOUR NAME DATE OF BIRTH CALL BACK NUMBER REASON FOR CALL**this is important as we prioritize the call backs  YOU WILL RECEIVE A CALL BACK THE SAME DAY AS LONG AS YOU CALL BEFORE 4:00 PM  At the [facility], you and your health needs are our priority. As part of our continuing mission to provide you with exceptional heart care, we have created designated Provider Care Teams. These Care Teams include your primary Cardiologist (physician) and Advanced Practice Providers (APPs- Physician Assistants and Nurse Practitioners) who all work together to provide you with the care you need, when you need it.   You may see any of the following providers on your designated Care Team at your next follow up: Dr Glori Bickers Dr Haynes Kerns, NP Lyda Jester, [place] [facility] [place], [place] Audry Riles, PharmD   Please be sure to bring in all your medications bottles to every appointment.

## 2022-01-12 NOTE — Progress Notes (Signed)
PCP: Orma Render, NP  Cardiology: Dr. Irish Lack HF Cardiology: Dr. Aundra Dubin  66 y.o. with history of chronic systolic CHF, CAD, and MGUS was referred by Dr. Irish Lack for evaluation of CHF.  Patient first developed exertional dyspnea after her appendectomy in 2/19.  By 5/19, she was gaining weight and had developed a chronic cough. She never had chest pain.  Echo was done in 5/19 showing EF 25-30%, moderate MR, moderate RV dilation.  RHC/LHC was done in 6/19 showing elevated filling pressures, preserved cardiac output, and occluded mid LCx and distal RCA.  She was found to have IgM monoclonal antibody.  Skeletal survey showed no lytic lesions, likely MGUS.  Cardiac MRI in 8/19 showed EF 32%, subendocardial scar (consistent with prior MI) at inferior base, moderately dilated RV with RV EF 20%. No evidence for cardiac amyloidosis by MRI.  Repeat echo in 10/19 showed EF remaining 30-35%.  She was seen by Dr. Rayann Heman and declined ICD.   Echo in 10/20 showed EF 40-45%.   She was unable to tolerate Entresto 97/103 due to lightheadedness.   Echo in 10/22 showed EF 50% with inferolateral hypokinesis, RV normal, IVC normal.   She has been diagnosed with uterine cancer and has been getting cisplatin chemotherapy and radiation.  She is almost finished with radiation.   She returns for followup of CHF.  Weight stable.  Coreg and spironolactone have been decreased due to hypotension/lightheadedness.  Lasix was decreased to 40 mg once a day.  She is currently doing well, no lightheadedness.  No dyspnea with her usual activities.  No chest pain.  No orthopnea/PND.    ECG (personally reviewed): NSR, poor RWP  Labs (5/19): transferrin saturation 20%, immunofixation with IgM monoclonal antibody.  Labs (6/19): K 4.5, creatinine 0.79 Labs (7/19): K 4.1, creatinine 0.86, TSH normal Labs (9/19): K 4.4, creatinine 0.61 Labs (10/19): LDL 187 Labs (12/19): LDL 82 Labs (1/20): K 4, creatinine 0.81 Labs (8/20): K 4.4,  creatinine 0.82, LDL 92 Labs (11/21): LDL 88, HDl 52, TGs 208 Labs (1/22): K 4.5, creatinine 1.02 Labs (5/22): K 4.2, creatinine 0.96 Labs( 5/23): K 4.5, creatinine 0.93, hgb 11.5  PMH: 1. Type II diabetes 2. OSA 3. H/o appendectomy 4. 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  Skeletal survey in 7/19 showed no lytic lesions.  6. 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   - Echo (5/19): EF 25-30%, mild LV dilation, moderate MR, moderately dilated RV.  - LHC/RHC (6/19): distal RCA totally occluded, mid LCx totally occluded, EF < 25%. No intervention. Mean RA 10, mean PA 42, mean PCWP 29, CI 2.3.  - Cardiac MRI in 8/19 showed EF 32%, subendocardial scar (consistent with prior MI) at inferior base, moderately dilated RV with RV EF 20%. No evidence for cardiac amyloidosis by MRI.  - Echo (10/19): EF 30-35%.   - Echo (10/20): EF 40-45%, mild LVH, mild MR - Echo (10/22): EF 50% with inferolateral hypokinesis, RV normal, IVC normal. 7. CAD: LHC (6/19) with distal RCA totally occluded, mid LCx totally occluded, EF < 25%. No intervention. 8. Hyperlipidemia: Has not tolerated statins.  9. ABIs (8/19): Normal.  10. COVID-19 infection in 9/22.   SH: Married, retired [employer] scientist, quit smoking in 1991.    FH: Mother with rheumatic MV disease, s/p MVR.   ROS: All systems reviewed and negative except as per HPI.   Current Outpatient Medications  Medication Sig Dispense Refill   acetaminophen (TYLENOL) 500 MG tablet Take 500  mg by mouth every 6 (six) hours as needed for moderate pain.     albuterol (PROAIR HFA) 108 (90 Base) MCG/ACT inhaler Inhale 2 puffs into the lungs every 6 (six) hours as needed. wheezing 18 g 2   aspirin 81 MG chewable tablet Chew 1 tablet (81 mg total) by mouth daily. 90 tablet 3   bismuth subsalicylate (PEPTO BISMOL) 262 MG chewable tablet Chew 524 mg by mouth as needed for indigestion or diarrhea or loose stools.      blood  glucose meter kit and supplies Use up to four times daily as directed. (FOR ICD-10 E10.9, E11.9). 1 each 99   carvedilol (COREG) 12.5 MG tablet Patient was instructed to reduce to 1/2 tablet twice daily on 4/25 due to dehydration-NG 180 tablet 3   diphenhydrAMINE (BENADRYL) 25 MG tablet Take 25 mg by mouth every 6 (six) hours as needed for allergies.     diphenoxylate-atropine (LOMOTIL) 2.5-0.025 MG tablet Take 1 tablet by mouth 4 (four) times daily as needed for diarrhea or loose stools. 30 tablet 0   Dulaglutide (TRULICITY) 3 MB/8.4YK SOPN Inject 3 mg into the skin once a week. 6 mL 3   Evolocumab (REPATHA SURECLICK) 599 MG/ML SOAJ Inject 1 pen into the skin every 14 (fourteen) days. 2 mL 11   furosemide (LASIX) 40 MG tablet Take 1 tablet (40 mg total) by mouth daily. 180 tablet 3   glucose blood test strip Use as directed to test blood sugar 100 each 1   insulin glargine-yfgn (SEMGLEE, YFGN,) 100 UNIT/ML Pen Inject 25-40 Units into the skin daily based on blood sugar levels via sliding scale. 15 mL 11   Insulin Pen Needle (UNIFINE PENTIPS) 32G X 4 MM MISC USE AS DIRECTED ONCE A DAY 100 each 3   levothyroxine (SYNTHROID) 50 MCG tablet Take 1 tablet (50 mcg total) by mouth daily before breakfast. 90 tablet 0   lidocaine-prilocaine (EMLA) cream Apply to affected area once 30 g 3   magnesium oxide (MAG-OX) 400 MG tablet Take 400 mg by mouth at bedtime as needed (for cramps).     metFORMIN (GLUCOPHAGE) 1000 MG tablet Take 1 tablet (1,000 mg total) by mouth 2 (two) times daily with a meal. 180 tablet 3   Multiple Vitamin (MULITIVITAMIN WITH MINERALS) TABS Take 1 tablet by mouth daily with breakfast.     ondansetron (ZOFRAN) 8 MG tablet Take 1 tablet (8 mg total) by mouth 2 (two) times daily as needed. Start on the third day after chemotherapy. 30 tablet 1   OneTouch Delica Lancets 35T MISC use as directed 100 each 1   prochlorperazine (COMPAZINE) 10 MG tablet Take 1 tablet (10 mg total) by mouth every  6 (six) hours as needed (Nausea or vomiting). 30 tablet 1   sacubitril-valsartan (ENTRESTO) 49-51 MG TAKE 1 TABLET BY MOUTH 2 (TWO) TIMES DAILY. 60 tablet 4   triamcinolone ointment (KENALOG) 0.1 % Apply 1 application topically two times daily to affected area(s) as needed, sparing use to avoid whitening/thinning skin 30 g 1   spironolactone (ALDACTONE) 25 MG tablet Take 1 tablet (25 mg total) by mouth daily. 90 tablet 3   No current facility-administered medications for this encounter.   BP 124/78   Pulse 82   Wt (!) 137.2 kg (302 lb 6.4 oz)   SpO2 98%   BMI 48.08 kg/m  General: NAD, obese.  Neck: No JVD, no thyromegaly or thyroid nodule.  Lungs: Clear to auscultation bilaterally with normal respiratory effort.  CV: Nondisplaced PMI.  Heart regular S1/S2, no S3/S4, no murmur.  Trace ankle edema.  No carotid bruit.  Normal pedal pulses.  Abdomen: Soft, nontender, no hepatosplenomegaly, no distention.  Skin: Intact without lesions or rashes.  Neurologic: Alert and oriented x 3.  Psych: Normal affect. Extremities: No clubbing or cyanosis.  HEENT: Normal.   Assessment/Plan: 1. Chronic systolic CHF: Echo in 4/33 with EF 25-30%.  She had occluded mid LCx and distal RCA on coronary angiography.  Suspect ischemic cardiomyopathy.  She also has a monoclonal antibody.  Cardiac MRI in 8/19 showed LV EF 32% and RV EF 30%, subendocardial scar at inferior base consistent with prior MI, no evidence for cardiac amyloidosis.  Repeat echo in 10/19 showed EF 30-35%.  Patient saw Dr. Rayann Heman and declined ICD.  However, echo in 10/20 showed EF up to 40-45% and out of ICD range. Echo in 10/22 showed EF 50% with inferolateral hypokinesis, RV normal, IVC normal.  NYHA class II symptoms, not volume overloaded on exam.   - Continue Entresto 49/51 bid, unable to tolerate increase to 97/103 bid.  - Can increase spironolactone back to 25 mg daily.  BMET in 1 week.  - Keep Coreg at 6.25 mg bid for now, can increase back  to 12.5 mg bid in the future if BP remains stable. - She has not wanted to take an SGLT2 inhibitor due to history of GU infections.    - Keep Lasix at lower dose, 40 mg daily.  - Repeat echo at followup in 5 months.  2. CAD: Occluded mLCx and dRCA.  Low EF somewhat out of proportion to this.  No interventional target.   - Continue ASA 81 daily.  - Unable to tolerate statins, taking Repatha. S 3. Obesity:  Work on diet and exercise.    Followup 5 months with echo.   Loralie Champagne 01/12/2022

## 2022-01-13 ENCOUNTER — Other Ambulatory Visit: Payer: Self-pay

## 2022-01-13 ENCOUNTER — Telehealth: Payer: Self-pay

## 2022-01-13 ENCOUNTER — Ambulatory Visit
Admission: RE | Admit: 2022-01-13 | Discharge: 2022-01-13 | Disposition: A | Discharge status: Home / Self Care | Payer: 59 | Source: Ambulatory Visit | Attending: Radiation Oncology | Admitting: Radiation Oncology

## 2022-01-13 DIAGNOSIS — Z5111 Encounter for antineoplastic chemotherapy: Secondary | ICD-10-CM | POA: Diagnosis not present

## 2022-01-13 LAB — RAD ONC ARIA SESSION SUMMARY
Course Elapsed Days: 30
Plan Fractions Treated to Date: 23
Plan Prescribed Dose Per Fraction: 1.8 Gy
Plan Total Fractions Prescribed: 25
Plan Total Prescribed Dose: 45 Gy
Reference Point Dosage Given to Date: 41.4 Gy
Reference Point Session Dosage Given: 1.8 Gy
Session Number: 23

## 2022-01-13 NOTE — Telephone Encounter (Signed)
Received a call from patient stating she feels really bad. Patient reports having  bright red vaginal bleeding and severe cramps. Encouraged patient to go the the emergency room for follow up. Patient voiced understanding.

## 2022-01-16 ENCOUNTER — Other Ambulatory Visit: Payer: Self-pay

## 2022-01-16 ENCOUNTER — Ambulatory Visit
Admission: RE | Admit: 2022-01-16 | Discharge: 2022-01-16 | Disposition: A | Discharge status: Home / Self Care | Payer: 59 | Source: Ambulatory Visit | Attending: Radiation Oncology | Admitting: Radiation Oncology

## 2022-01-16 ENCOUNTER — Inpatient Hospital Stay: Payer: 59

## 2022-01-16 ENCOUNTER — Inpatient Hospital Stay: Payer: 59 | Admitting: Hematology and Oncology

## 2022-01-16 DIAGNOSIS — C541 Malignant neoplasm of endometrium: Secondary | ICD-10-CM

## 2022-01-16 DIAGNOSIS — Z5111 Encounter for antineoplastic chemotherapy: Secondary | ICD-10-CM | POA: Diagnosis not present

## 2022-01-16 LAB — BASIC METABOLIC PANEL - CANCER CENTER ONLY
Anion gap: 8 (ref 5–15)
BUN: 22 mg/dL (ref 8–23)
CO2: 26 mmol/L (ref 22–32)
Calcium: 9.4 mg/dL (ref 8.9–10.3)
Chloride: 100 mmol/L (ref 98–111)
Creatinine: 0.88 mg/dL (ref 0.44–1.00)
GFR, Estimated: >60 mL/min (ref >60)
Glucose, Bld: 191 mg/dL — ABNORMAL HIGH (ref 70–99)
Potassium: 4.7 mmol/L (ref 3.5–5.1)
Sodium: 134 mmol/L — ABNORMAL LOW (ref 135–145)

## 2022-01-16 LAB — RAD ONC ARIA SESSION SUMMARY
Course Elapsed Days: 33
Plan Fractions Treated to Date: 24
Plan Prescribed Dose Per Fraction: 1.8 Gy
Plan Total Fractions Prescribed: 25
Plan Total Prescribed Dose: 45 Gy
Reference Point Dosage Given to Date: 43.2 Gy
Reference Point Session Dosage Given: 1.8 Gy
Session Number: 24

## 2022-01-16 LAB — CBC WITH DIFFERENTIAL (CANCER CENTER ONLY)
Abs Immature Granulocytes: 0.03 10*3/uL (ref 0.00–0.07)
Basophils Absolute: 0 10*3/uL (ref 0.0–0.1)
Basophils Relative: 1 %
Eosinophils Absolute: 0.2 10*3/uL (ref 0.0–0.5)
Eosinophils Relative: 5 %
HCT: 34.9 % — ABNORMAL LOW (ref 36.0–46.0)
Hemoglobin: 11.9 g/dL — ABNORMAL LOW (ref 12.0–15.0)
Immature Granulocytes: 1 %
Lymphocytes Relative: 13 %
Lymphs Abs: 0.6 10*3/uL — ABNORMAL LOW (ref 0.7–4.0)
MCH: 29.2 pg (ref 26.0–34.0)
MCHC: 34.1 g/dL (ref 30.0–36.0)
MCV: 85.5 fL (ref 80.0–100.0)
Monocytes Absolute: 0.5 10*3/uL (ref 0.1–1.0)
Monocytes Relative: 10 %
Neutro Abs: 3.3 10*3/uL (ref 1.7–7.7)
Neutrophils Relative %: 70 %
Platelet Count: 257 10*3/uL (ref 150–400)
RBC: 4.08 MIL/uL (ref 3.87–5.11)
RDW: 14.1 % (ref 11.5–15.5)
WBC Count: 4.7 10*3/uL (ref 4.0–10.5)
nRBC: 0 % (ref 0.0–0.2)

## 2022-01-16 LAB — MAGNESIUM: Magnesium: 1.6 mg/dL — ABNORMAL LOW (ref 1.7–2.4)

## 2022-01-16 MED ORDER — HEPARIN SOD (PORK) LOCK FLUSH 100 UNIT/ML IV SOLN
500.0000 [IU] | Freq: Once | INTRAVENOUS | Status: AC
  Administered 2022-01-16: 500 [IU]

## 2022-01-16 MED ORDER — SODIUM CHLORIDE 0.9% FLUSH
10.0000 mL | Freq: Once | INTRAVENOUS | Status: AC
  Administered 2022-01-16: 10 mL

## 2022-01-17 ENCOUNTER — Encounter: Payer: Self-pay | Admitting: Hematology and Oncology

## 2022-01-17 ENCOUNTER — Other Ambulatory Visit (HOSPITAL_COMMUNITY): Payer: Self-pay

## 2022-01-17 ENCOUNTER — Other Ambulatory Visit: Payer: Self-pay

## 2022-01-17 ENCOUNTER — Inpatient Hospital Stay (HOSPITAL_BASED_OUTPATIENT_CLINIC_OR_DEPARTMENT_OTHER): Payer: 59 | Admitting: Hematology and Oncology

## 2022-01-17 ENCOUNTER — Ambulatory Visit
Admission: RE | Admit: 2022-01-17 | Discharge: 2022-01-17 | Disposition: A | Discharge status: Home / Self Care | Payer: 59 | Source: Ambulatory Visit | Attending: Radiation Oncology | Admitting: Radiation Oncology

## 2022-01-17 DIAGNOSIS — C541 Malignant neoplasm of endometrium: Secondary | ICD-10-CM | POA: Diagnosis not present

## 2022-01-17 DIAGNOSIS — Z5111 Encounter for antineoplastic chemotherapy: Secondary | ICD-10-CM | POA: Diagnosis not present

## 2022-01-17 DIAGNOSIS — E1159 Type 2 diabetes mellitus with other circulatory complications: Secondary | ICD-10-CM

## 2022-01-17 DIAGNOSIS — E1165 Type 2 diabetes mellitus with hyperglycemia: Secondary | ICD-10-CM | POA: Diagnosis not present

## 2022-01-17 LAB — RAD ONC ARIA SESSION SUMMARY
Course Elapsed Days: 34
Plan Fractions Treated to Date: 25
Plan Prescribed Dose Per Fraction: 1.8 Gy
Plan Total Fractions Prescribed: 25
Plan Total Prescribed Dose: 45 Gy
Reference Point Dosage Given to Date: 45 Gy
Reference Point Session Dosage Given: 1.8 Gy
Session Number: 25

## 2022-01-17 MED ORDER — MAGNESIUM OXIDE -MG SUPPLEMENT 400 (240 MG) MG PO TABS
400.0000 mg | ORAL_TABLET | Freq: Every day | ORAL | 0 refills | Status: DC
  Filled 2022-01-17 – 2022-01-20 (×2): qty 30, 30d supply, fill #0

## 2022-01-17 NOTE — Progress Notes (Signed)
[facility] OFFICE PROGRESS NOTE  Patient Care Team: Early, Coralee Pesa, NP as PCP - General (Nurse Practitioner) Jettie Booze, MD as PCP - Cardiology (Cardiology) Rigoberto Noel, MD as Consulting Physician (Pulmonary Disease) Alphonsa Overall, MD (General Surgery) Awanda Mink Craige Cotta, RN as Oncology Nurse Navigator (Oncology) Heath Lark, MD as Consulting Physician (Hematology and Oncology)  ASSESSMENT & PLAN:  Endometrial cancer [facility]) She has completed chemotherapy and radiation treatment without major side effects She has very mild anemia and hypocalcemia which is not unexpected She will be scheduled for MRI to assess response to treatment I will call her with test results She would either go for surgery afterwards or come back for more chemotherapy She expressed understanding  Hypomagnesemia She has low magnesium likely due to side effects of cisplatin I recommend low-dose magnesium replacement therapy  Poorly controlled type 2 diabetes mellitus with circulatory disorder ([place]) She has mild intermittent blood sugar that is high She is trying her best to manage through dietary modification  No orders of the defined types were placed in this encounter.   All questions were answered. The patient knows to call the clinic with any problems, questions or concerns. The total time spent in the appointment was 20 minutes encounter with patients including review of chart and various tests results, discussions about plan of care and coordination of care plan   Heath Lark, MD 01/17/2022 10:31 AM  INTERVAL HISTORY: Please see below for problem oriented charting. she returns for treatment follow-up since completion of treatment She felt well Denies recent diarrhea or nausea No recent side effects from treatment  REVIEW OF SYSTEMS:   Constitutional: Denies fevers, chills or abnormal weight loss Eyes: Denies blurriness of vision Ears, nose, mouth, throat, and face: Denies  mucositis or sore throat Respiratory: Denies cough, dyspnea or wheezes Cardiovascular: Denies palpitation, chest discomfort or lower extremity swelling Gastrointestinal:  Denies nausea, heartburn or change in bowel habits Skin: Denies abnormal skin rashes Lymphatics: Denies new lymphadenopathy or easy bruising Neurological:Denies numbness, tingling or new weaknesses Behavioral/Psych: Mood is stable, no new changes  All other systems were reviewed with the patient and are negative.  I have reviewed the past medical history, past surgical history, social history and family history with the patient and they are unchanged from previous note.  ALLERGIES:  is allergic to amoxicillin, adhesive [tape], exenatide, invokana [canagliflozin], jardiance [empagliflozin], lisinopril, other, and statins.  MEDICATIONS:  Current Outpatient Medications  Medication Sig Dispense Refill   magnesium oxide (MAG-OX) 400 (240 Mg) MG tablet Take 1 tablet (400 mg total) by mouth daily. 30 tablet 0   acetaminophen (TYLENOL) 500 MG tablet Take 500 mg by mouth every 6 (six) hours as needed for moderate pain.     albuterol (PROAIR HFA) 108 (90 Base) MCG/ACT inhaler Inhale 2 puffs into the lungs every 6 (six) hours as needed. wheezing 18 g 2   aspirin 81 MG chewable tablet Chew 1 tablet (81 mg total) by mouth daily. 90 tablet 3   bismuth subsalicylate (PEPTO BISMOL) 262 MG chewable tablet Chew 524 mg by mouth as needed for indigestion or diarrhea or loose stools.      blood glucose meter kit and supplies Use up to four times daily as directed. (FOR ICD-10 E10.9, E11.9). 1 each 99   carvedilol (COREG) 12.5 MG tablet Patient was instructed to reduce to 1/2 tablet twice daily on 4/25 due to dehydration-NG 180 tablet 3   diphenhydrAMINE (BENADRYL) 25 MG tablet Take 25  mg by mouth every 6 (six) hours as needed for allergies.     diphenoxylate-atropine (LOMOTIL) 2.5-0.025 MG tablet Take 1 tablet by mouth 4 (four) times daily as  needed for diarrhea or loose stools. 30 tablet 0   Dulaglutide (TRULICITY) 3 AS/5.0NL SOPN Inject 3 mg into the skin once a week. 6 mL 3   Evolocumab (REPATHA SURECLICK) 976 MG/ML SOAJ Inject 1 pen into the skin every 14 (fourteen) days. 2 mL 11   furosemide (LASIX) 40 MG tablet Take 1 tablet (40 mg total) by mouth daily. 180 tablet 3   glucose blood test strip Use as directed to test blood sugar 100 each 1   insulin glargine-yfgn (SEMGLEE, YFGN,) 100 UNIT/ML Pen Inject 25-40 Units into the skin daily based on blood sugar levels via sliding scale. 15 mL 11   Insulin Pen Needle (UNIFINE PENTIPS) 32G X 4 MM MISC USE AS DIRECTED ONCE A DAY 100 each 3   levothyroxine (SYNTHROID) 50 MCG tablet Take 1 tablet (50 mcg total) by mouth daily before breakfast. 90 tablet 0   lidocaine-prilocaine (EMLA) cream Apply to affected area once 30 g 3   magnesium oxide (MAG-OX) 400 MG tablet Take 400 mg by mouth at bedtime as needed (for cramps).     metFORMIN (GLUCOPHAGE) 1000 MG tablet Take 1 tablet (1,000 mg total) by mouth 2 (two) times daily with a meal. 180 tablet 3   Multiple Vitamin (MULITIVITAMIN WITH MINERALS) TABS Take 1 tablet by mouth daily with breakfast.     ondansetron (ZOFRAN) 8 MG tablet Take 1 tablet (8 mg total) by mouth 2 (two) times daily as needed. Start on the third day after chemotherapy. 30 tablet 1   OneTouch Delica Lancets 73A MISC use as directed 100 each 1   prochlorperazine (COMPAZINE) 10 MG tablet Take 1 tablet (10 mg total) by mouth every 6 (six) hours as needed (Nausea or vomiting). 30 tablet 1   sacubitril-valsartan (ENTRESTO) 49-51 MG TAKE 1 TABLET BY MOUTH 2 (TWO) TIMES DAILY. 60 tablet 4   spironolactone (ALDACTONE) 25 MG tablet Take 1 tablet (25 mg total) by mouth daily. 90 tablet 3   triamcinolone ointment (KENALOG) 0.1 % Apply 1 application topically two times daily to affected area(s) as needed, sparing use to avoid whitening/thinning skin 30 g 1   No current  facility-administered medications for this visit.    SUMMARY OF ONCOLOGIC HISTORY: Oncology History  Endometrial cancer ([place])  10/27/2021 Pathology Results   FINAL MICROSCOPIC DIAGNOSIS:   A. ENDOMETRIUM, BIOPSY:  - Endometrioid adenocarcinoma with focal secretory features.  - See comment.   COMMENT:  The carcinoma has endometrioid features with a few small foci with secretory features.  There are several foci of solid pattern and the findings are consistent with FIGO grade 1/2.  Immunohistochemistry shows diffuse strong positivity estrogen receptor and Napsin A is negative.  P53 shows wild-type pattern.    11/07/2021 Initial Diagnosis   Endometrial cancer ([place])    11/14/2021 Imaging   MRI pelvis 1. Thickened (18 mm) endometrium, compatible with known endometrial malignancy. Evidence of full-thickness myometrial invasion by endometrial tumor throughout the left uterine body extending over 180 degrees of involvement. Probable early small focus of direct tumor invasion into the parametrial soft tissues in the anterior left uterine body as detailed. 2. Suspect a combination of direct endometrial tumor involvement of the upper endocervical canal and blood products within the endocervix. 3. No pelvic lymphadenopathy.  No adnexal masses.  11/24/2021 Imaging   CT abdomen and pelvis No evidence of abdominal metastatic disease or other acute findings.   Mild hepatic steatosis.   Colonic diverticulosis, without radiographic evidence of diverticulitis.   Aortic Atherosclerosis (ICD10-I70.0).     12/02/2021 Cancer Staging   Staging form: Corpus Uteri - Carcinoma and Carcinosarcoma, AJCC 8th Edition - Clinical stage from 12/02/2021: FIGO Stage IIIB (cT3b, cN0, cM0) - Signed by Heath Lark, MD on 12/02/2021 Stage prefix: Initial diagnosis    12/07/2021 Procedure   Status post placement of right IJ port catheter   12/13/2021 - 01/10/2022 Chemotherapy   Patient is on Treatment Plan : Uterine  Cisplatin days 1 and 29 + XRT        PHYSICAL EXAMINATION: ECOG PERFORMANCE STATUS: 1 - Symptomatic but completely ambulatory  Vitals:   01/17/22 0904  BP: 116/68  Pulse: (!) 103  Resp: 18  Temp: (!) 97.4 F (36.3 C)  SpO2: 100%   Filed Weights   01/17/22 0904  Weight: 292 lb 9.6 oz (132.7 kg)    GENERAL:alert, no distress and comfortable NEURO: alert & oriented x 3 with fluent speech, no focal motor/sensory deficits  LABORATORY DATA:  I have reviewed the data as listed    Component Value Date/Time   NA 134 (L) 01/16/2022 0834   NA 136 04/11/2019 1111   K 4.7 01/16/2022 0834   CL 100 01/16/2022 0834   CO2 26 01/16/2022 0834   GLUCOSE 191 (H) 01/16/2022 0834   BUN 22 01/16/2022 0834   BUN 16 04/11/2019 1111   CREATININE 0.88 01/16/2022 0834   CREATININE 0.70 03/14/2016 1228   CALCIUM 9.4 01/16/2022 0834   PROT 8.0 01/06/2021 1428   PROT 7.0 11/25/2019 0832   ALBUMIN 3.9 01/06/2021 1428   ALBUMIN 3.6 (L) 11/25/2019 0832   AST 12 (L) 01/06/2021 1428   AST 13 (L) 09/09/2020 0823   ALT 14 01/06/2021 1428   ALT 15 09/09/2020 0823   ALKPHOS 77 01/06/2021 1428   BILITOT 0.3 01/06/2021 1428   BILITOT 0.5 09/09/2020 0823   GFRNONAA >60 01/16/2022 0834   GFRNONAA >89 03/14/2016 1228   GFRAA >60 05/19/2020 0845   GFRAA >60 09/10/2019 0746   GFRAA >89 03/14/2016 1228    No results found for: SPEP, UPEP  Lab Results  Component Value Date   WBC 4.7 01/16/2022   NEUTROABS 3.3 01/16/2022   HGB 11.9 (L) 01/16/2022   HCT 34.9 (L) 01/16/2022   MCV 85.5 01/16/2022   PLT 257 01/16/2022      Chemistry      Component Value Date/Time   NA 134 (L) 01/16/2022 0834   NA 136 04/11/2019 1111   K 4.7 01/16/2022 0834   CL 100 01/16/2022 0834   CO2 26 01/16/2022 0834   BUN 22 01/16/2022 0834   BUN 16 04/11/2019 1111   CREATININE 0.88 01/16/2022 0834   CREATININE 0.70 03/14/2016 1228      Component Value Date/Time   CALCIUM 9.4 01/16/2022 0834   ALKPHOS 77  01/06/2021 1428   AST 12 (L) 01/06/2021 1428   AST 13 (L) 09/09/2020 0823   ALT 14 01/06/2021 1428   ALT 15 09/09/2020 0823   BILITOT 0.3 01/06/2021 1428   BILITOT 0.5 09/09/2020 0321

## 2022-01-17 NOTE — Assessment & Plan Note (Signed)
She has mild intermittent blood sugar that is high She is trying her best to manage through dietary modification

## 2022-01-17 NOTE — Assessment & Plan Note (Signed)
She has low magnesium likely due to side effects of cisplatin I recommend low-dose magnesium replacement therapy

## 2022-01-17 NOTE — Assessment & Plan Note (Signed)
She has completed chemotherapy and radiation treatment without major side effects She has very mild anemia and hypocalcemia which is not unexpected She will be scheduled for MRI to assess response to treatment I will call her with test results She would either go for surgery afterwards or come back for more chemotherapy She expressed understanding

## 2022-01-18 ENCOUNTER — Other Ambulatory Visit: Payer: Self-pay | Admitting: Radiation Oncology

## 2022-01-18 ENCOUNTER — Telehealth: Payer: Self-pay | Admitting: Oncology

## 2022-01-18 ENCOUNTER — Other Ambulatory Visit (HOSPITAL_COMMUNITY): Payer: Self-pay | Admitting: Radiation Oncology

## 2022-01-18 DIAGNOSIS — C541 Malignant neoplasm of endometrium: Secondary | ICD-10-CM

## 2022-01-18 NOTE — Telephone Encounter (Signed)
Terri Heath called and said her insurance is changing to [facility] on 01/26/22 and needs to move her preadmission appointment to 01/27/22 to make sure it is covered.  Advised I will send a message to have the appointment rescheduled to 01/27/22.

## 2022-01-19 ENCOUNTER — Other Ambulatory Visit: Payer: Self-pay | Admitting: Hematology and Oncology

## 2022-01-19 ENCOUNTER — Other Ambulatory Visit (HOSPITAL_COMMUNITY): Payer: Self-pay

## 2022-01-19 ENCOUNTER — Other Ambulatory Visit (HOSPITAL_COMMUNITY): Payer: Self-pay | Admitting: Cardiology

## 2022-01-19 ENCOUNTER — Other Ambulatory Visit (HOSPITAL_BASED_OUTPATIENT_CLINIC_OR_DEPARTMENT_OTHER): Payer: Self-pay | Admitting: Nurse Practitioner

## 2022-01-19 ENCOUNTER — Other Ambulatory Visit: Payer: Self-pay | Admitting: Cardiology

## 2022-01-19 DIAGNOSIS — C541 Malignant neoplasm of endometrium: Secondary | ICD-10-CM

## 2022-01-19 MED ORDER — FUROSEMIDE 40 MG PO TABS
40.0000 mg | ORAL_TABLET | Freq: Every day | ORAL | 3 refills | Status: DC
  Filled 2022-01-19: qty 90, 90d supply, fill #0

## 2022-01-19 MED ORDER — ENTRESTO 49-51 MG PO TABS
1.0000 | ORAL_TABLET | Freq: Two times a day (BID) | ORAL | 4 refills | Status: DC
  Filled 2022-01-19: qty 60, 30d supply, fill #0
  Filled 2022-03-29: qty 60, 30d supply, fill #1
  Filled 2022-05-16: qty 60, 30d supply, fill #2
  Filled 2022-06-25 – 2022-08-03 (×2): qty 60, 30d supply, fill #3

## 2022-01-20 ENCOUNTER — Other Ambulatory Visit (HOSPITAL_COMMUNITY): Payer: Self-pay

## 2022-01-20 NOTE — Progress Notes (Signed)
Sent message, via epic in basket, requesting orders in epic from surgeon.  

## 2022-01-24 ENCOUNTER — Ambulatory Visit (HOSPITAL_COMMUNITY)
Admission: RE | Admit: 2022-01-24 | Discharge: 2022-01-24 | Disposition: A | Discharge status: Home / Self Care | Payer: 59 | Source: Ambulatory Visit | Attending: Radiation Oncology | Admitting: Radiation Oncology

## 2022-01-24 DIAGNOSIS — C541 Malignant neoplasm of endometrium: Secondary | ICD-10-CM | POA: Insufficient documentation

## 2022-01-24 MED ORDER — GADOBUTROL 1 MMOL/ML IV SOLN
10.0000 mL | Freq: Once | INTRAVENOUS | Status: AC | PRN
  Administered 2022-01-24: 10 mL via INTRAVENOUS

## 2022-01-26 ENCOUNTER — Other Ambulatory Visit (HOSPITAL_COMMUNITY): Payer: 59

## 2022-01-26 ENCOUNTER — Telehealth: Payer: Self-pay | Admitting: Oncology

## 2022-01-26 NOTE — Progress Notes (Signed)
Anesthesia Review:  PCP: Cardiologist : DR dalton Barrie Lyme 01/12/2022  Chest x-ray : EKG :01/12/2022  Echo : 01/12/2022  Stress test: Cardiac Cath : 2019  Activity level:  Sleep Study/ CPAP : Fasting Blood Sugar :      / Checks Blood Sugar -- times a day:   Blood Thinner/ Instructions /Last Dose: ASA / Instructions/ Last Dose :   01/16/22- cbc and bmp

## 2022-01-26 NOTE — Progress Notes (Signed)
DUE TO COVID-19 ONLY ONE VISITOR IS ALLOWED TO COME WITH YOU AND STAY IN THE WAITING ROOM ONLY DURING PRE OP AND PROCEDURE DAY OF SURGERY.  2 VISITOR  MAY VISIT WITH YOU AFTER SURGERY IN YOUR PRIVATE ROOM DURING VISITING HOURS ONLY! YOU MAY HAVE ONE PERSON SPEND THE NITE WITH YOU IN YOUR ROOM AFTER SURGERY.      Your procedure is scheduled on:    02/01/2022   Report to [facility] Main  Entrance   Report to admitting at      0615            AM DO NOT [place], PICTURE ID OR WALLET DAY OF SURGERY.      Call this number if you have problems the morning of surgery [phone]    REMEMBER: NO  SOLID FOODS , CANDY, GUM OR MINTS AFTER Terri Heath .       Marland Kitchen CLEAR LIQUIDS UNTIL      0530am           DAY OF SURGERY.             CLEAR LIQUID DIET   Foods Allowed      WATER BLACK COFFEE ( SUGAR OK, NO MILK, CREAM OR CREAMER) REGULAR AND DECAF  TEA ( SUGAR OK NO MILK, CREAM, OR CREAMER) REGULAR AND DECAF  PLAIN JELLO ( NO RED)  FRUIT ICES ( NO RED, NO FRUIT PULP)  POPSICLES ( NO RED)  JUICE- APPLE, WHITE GRAPE AND WHITE CRANBERRY  SPORT DRINK LIKE GATORADE ( NO RED)  CLEAR BROTH ( VEGETABLE , CHICKEN OR BEEF)                                                                     BRUSH YOUR TEETH MORNING OF SURGERY AND RINSE YOUR MOUTH OUT, NO CHEWING GUM CANDY OR MINTS.     Take these medicines the morning of surgery with A SIP OF WATER:  synthroid, inhalers as usual and bring, coreg      DO NOT TAKE ANY DIABETIC MEDICATIONS DAY OF YOUR SURGERY                               You may not have any metal on your body including hair pins and              piercings  Do not wear jewelry, make-up, lotions, powders or perfumes, deodorant             Do not wear nail polish on your fingernails.              IF YOU ARE A FEMALE AND WANT TO SHAVE UNDER ARMS OR LEGS PRIOR TO SURGERY YOU MUST DO SO AT LEAST 48 HOURS PRIOR TO SURGERY.              Men may  shave face and neck.   Do not bring valuables to the hospital. [place].  Contacts, dentures or bridgework may not be worn into surgery.  Leave suitcase in the  car. After surgery it may be brought to your room.     Patients discharged the day of surgery will not be allowed to drive home. IF YOU ARE HAVING SURGERY AND GOING HOME THE SAME DAY, YOU MUST HAVE AN ADULT TO DRIVE YOU HOME AND BE WITH YOU FOR 24 HOURS. YOU MAY GO HOME BY TAXI OR UBER OR ORTHERWISE, BUT AN ADULT MUST ACCOMPANY YOU HOME AND STAY WITH YOU FOR 24 HOURS.                Please read over the following fact sheets you were given: _____________________________________________________________________  [facility] - Preparing for Surgery Before surgery, you can play an important role.  Because skin is not sterile, your skin needs to be as free of germs as possible.  You can reduce the number of germs on your skin by washing with CHG (chlorahexidine gluconate) soap before surgery.  CHG is an antiseptic cleaner which kills germs and bonds with the skin to continue killing germs even after washing. Please DO NOT use if you have an allergy to CHG or antibacterial soaps.  If your skin becomes reddened/irritated stop using the CHG and inform your nurse when you arrive at Short Stay. Do not shave (including legs and underarms) for at least 48 hours prior to the first CHG shower.  You may shave your face/neck. Please follow these instructions carefully:  1.  Shower with CHG Soap the night before surgery and the  morning of Surgery.  2.  If you choose to wash your hair, wash your hair first as usual with your  normal  shampoo.  3.  After you shampoo, rinse your hair and body thoroughly to remove the  shampoo.                           4.  Use CHG as you would any other liquid soap.  You can apply chg directly  to the skin and wash                       Gently with a scrungie or clean  washcloth.  5.  Apply the CHG Soap to your body ONLY FROM THE NECK DOWN.   Do not use on face/ open                           Wound or open sores. Avoid contact with eyes, ears mouth and genitals (private parts).                       Wash face,  Genitals (private parts) with your normal soap.             6.  Wash thoroughly, paying special attention to the area where your surgery  will be performed.  7.  Thoroughly rinse your body with warm water from the neck down.  8.  DO NOT shower/wash with your normal soap after using and rinsing off  the CHG Soap.                9.  Pat yourself dry with a clean towel.            10.  Wear clean pajamas.            11.  Place clean sheets on your bed the night of your first shower and do not  sleep with pets. Day of Surgery : Do not apply any lotions/deodorants the morning of surgery.  Please wear clean clothes to the hospital/surgery center.  FAILURE TO FOLLOW THESE INSTRUCTIONS MAY RESULT IN THE CANCELLATION OF YOUR SURGERY PATIENT SIGNATURE_________________________________  NURSE SIGNATURE__________________________________  ________________________________________________________________________

## 2022-01-26 NOTE — Telephone Encounter (Signed)
Terri Heath called and asked if her appointment with Dr. Berline Lopes can be in person instead of a phone visit because she will be at [facility] for her preop appointment.  Changed appointment to in person at 11:30.

## 2022-01-27 ENCOUNTER — Encounter (HOSPITAL_COMMUNITY): Payer: Self-pay

## 2022-01-27 ENCOUNTER — Encounter: Payer: Self-pay | Admitting: Hematology and Oncology

## 2022-01-27 ENCOUNTER — Other Ambulatory Visit: Payer: Self-pay

## 2022-01-27 ENCOUNTER — Inpatient Hospital Stay: Payer: 59 | Attending: Gynecologic Oncology | Admitting: Gynecologic Oncology

## 2022-01-27 ENCOUNTER — Encounter (HOSPITAL_COMMUNITY)
Admission: RE | Admit: 2022-01-27 | Discharge: 2022-01-27 | Disposition: A | Discharge status: Home / Self Care | Payer: Medicare HMO | Source: Ambulatory Visit | Attending: Radiation Oncology | Admitting: Radiation Oncology

## 2022-01-27 ENCOUNTER — Telehealth: Payer: Self-pay | Admitting: Oncology

## 2022-01-27 VITALS — BP 108/73 | HR 93 | Temp 97.8°F | Ht 67.0 in | Wt 279.0 lb

## 2022-01-27 VITALS — BP 100/51 | HR 100 | Temp 98.5°F | Resp 16 | Ht 66.5 in | Wt 279.0 lb

## 2022-01-27 DIAGNOSIS — Z7984 Long term (current) use of oral hypoglycemic drugs: Secondary | ICD-10-CM | POA: Insufficient documentation

## 2022-01-27 DIAGNOSIS — E119 Type 2 diabetes mellitus without complications: Secondary | ICD-10-CM

## 2022-01-27 DIAGNOSIS — I255 Ischemic cardiomyopathy: Secondary | ICD-10-CM | POA: Diagnosis not present

## 2022-01-27 DIAGNOSIS — Z01812 Encounter for preprocedural laboratory examination: Secondary | ICD-10-CM | POA: Insufficient documentation

## 2022-01-27 DIAGNOSIS — I2510 Atherosclerotic heart disease of native coronary artery without angina pectoris: Secondary | ICD-10-CM | POA: Diagnosis not present

## 2022-01-27 DIAGNOSIS — Z6841 Body Mass Index (BMI) 40.0 and over, adult: Secondary | ICD-10-CM | POA: Diagnosis not present

## 2022-01-27 DIAGNOSIS — Z794 Long term (current) use of insulin: Secondary | ICD-10-CM | POA: Diagnosis not present

## 2022-01-27 DIAGNOSIS — C541 Malignant neoplasm of endometrium: Secondary | ICD-10-CM | POA: Insufficient documentation

## 2022-01-27 DIAGNOSIS — N898 Other specified noninflammatory disorders of vagina: Secondary | ICD-10-CM | POA: Diagnosis not present

## 2022-01-27 DIAGNOSIS — G4733 Obstructive sleep apnea (adult) (pediatric): Secondary | ICD-10-CM

## 2022-01-27 DIAGNOSIS — R109 Unspecified abdominal pain: Secondary | ICD-10-CM | POA: Diagnosis not present

## 2022-01-27 DIAGNOSIS — R5383 Other fatigue: Secondary | ICD-10-CM | POA: Diagnosis not present

## 2022-01-27 DIAGNOSIS — Z9221 Personal history of antineoplastic chemotherapy: Secondary | ICD-10-CM | POA: Insufficient documentation

## 2022-01-27 DIAGNOSIS — E1165 Type 2 diabetes mellitus with hyperglycemia: Secondary | ICD-10-CM

## 2022-01-27 DIAGNOSIS — Z923 Personal history of irradiation: Secondary | ICD-10-CM | POA: Diagnosis not present

## 2022-01-27 DIAGNOSIS — I5022 Chronic systolic (congestive) heart failure: Secondary | ICD-10-CM | POA: Diagnosis not present

## 2022-01-27 DIAGNOSIS — D472 Monoclonal gammopathy: Secondary | ICD-10-CM | POA: Diagnosis not present

## 2022-01-27 DIAGNOSIS — Z7985 Long-term (current) use of injectable non-insulin antidiabetic drugs: Secondary | ICD-10-CM | POA: Diagnosis not present

## 2022-01-27 DIAGNOSIS — E1140 Type 2 diabetes mellitus with diabetic neuropathy, unspecified: Secondary | ICD-10-CM | POA: Insufficient documentation

## 2022-01-27 DIAGNOSIS — Z01818 Encounter for other preprocedural examination: Secondary | ICD-10-CM

## 2022-01-27 DIAGNOSIS — I110 Hypertensive heart disease with heart failure: Secondary | ICD-10-CM | POA: Diagnosis not present

## 2022-01-27 HISTORY — DX: Acute myocardial infarction, unspecified: I21.9

## 2022-01-27 HISTORY — DX: Gastro-esophageal reflux disease without esophagitis: K21.9

## 2022-01-27 LAB — HEMOGLOBIN A1C
Hgb A1c MFr Bld: 8.6 % — ABNORMAL HIGH (ref 4.8–5.6)
Mean Plasma Glucose: 200.12 mg/dL

## 2022-01-27 LAB — GLUCOSE, CAPILLARY: Glucose-Capillary: 165 mg/dL — ABNORMAL HIGH (ref 70–99)

## 2022-01-27 LAB — SURGICAL PCR SCREEN
MRSA, PCR: NEGATIVE
Staphylococcus aureus: NEGATIVE

## 2022-01-27 NOTE — Patient Instructions (Signed)
We will hold surgery time for you on March 21, 2022. This may be moved to the week earlier closer to the date if time becomes available.   We will plan to see you back in the office on July 10 for a pre-op appointment with Dr. Berline Lopes and Lenna Sciara NP.   Continue weight loss efforts and monitoring blood glucose levels.   We will reach out to your PCP and cardiologist about obtaining clearance/risk stratification for surgery.  Please call the office for any questions or concerns at [phone].

## 2022-01-27 NOTE — Telephone Encounter (Signed)
Called Terri Heath and advised that her tandem and ring procedure has been authorized with [employer].  She verbalized understanding and agreement.

## 2022-01-27 NOTE — Progress Notes (Signed)
Gynecologic Oncology Return Clinic Visit  01/27/22  Reason for Visit: treatment planning  Treatment History: Oncology History  Endometrial cancer ([place])  10/27/2021 Pathology Results   FINAL MICROSCOPIC DIAGNOSIS:   A. ENDOMETRIUM, BIOPSY:  - Endometrioid adenocarcinoma with focal secretory features.  - See comment.   COMMENT:  The carcinoma has endometrioid features with a few small foci with secretory features.  There are several foci of solid pattern and the findings are consistent with FIGO grade 1/2.  Immunohistochemistry shows diffuse strong positivity estrogen receptor and Napsin A is negative.  P53 shows wild-type pattern.    11/07/2021 Initial Diagnosis   Endometrial cancer ([place])    11/14/2021 Imaging   MRI pelvis 1. Thickened (18 mm) endometrium, compatible with known endometrial malignancy. Evidence of full-thickness myometrial invasion by endometrial tumor throughout the left uterine body extending over 180 degrees of involvement. Probable early small focus of direct tumor invasion into the parametrial soft tissues in the anterior left uterine body as detailed. 2. Suspect a combination of direct endometrial tumor involvement of the upper endocervical canal and blood products within the endocervix. 3. No pelvic lymphadenopathy.  No adnexal masses.       11/24/2021 Imaging   CT abdomen and pelvis No evidence of abdominal metastatic disease or other acute findings.   Mild hepatic steatosis.   Colonic diverticulosis, without radiographic evidence of diverticulitis.   Aortic Atherosclerosis (ICD10-I70.0).     12/02/2021 Cancer Staging   Staging form: Corpus Uteri - Carcinoma and Carcinosarcoma, AJCC 8th Edition - Clinical stage from 12/02/2021: FIGO Stage IIIB (cT3b, cN0, cM0) - Signed by Heath Lark, MD on 12/02/2021 Stage prefix: Initial diagnosis    12/07/2021 Procedure   Status post placement of right IJ port catheter   12/13/2021 - 01/10/2022 Chemotherapy   Patient is  on Treatment Plan : Uterine Cisplatin days 1 and 29 + XRT      01/25/2022 Imaging   MRI pelvis Decreased endometrial thickness, consistent with interval response to therapy. Persistent deep myometrial invasion (> 50%) noted in the left lateral uterine corpus. No evidence of extra-uterine tumor extension.    No evidence of pelvic metastatic disease.   Sigmoid diverticulosis, without evidence of diverticulitis.     Interval History: Doing well. Notes considerable fatigue with treatment. Had an episode 5/20-21 of considerable cramping and discharge, denies any cramping, discharge or bleeding since. Burning with urination has resolved. She endorses some looser stools since radiation and some discomfort.   She denies any shortness of breath or chest pain. Blood surgars have been mostly under 200. I looked at her meter today, most values are between 130-190 (one value seen at 301).   Past Medical/Surgical History: Past Medical History:  Diagnosis Date   Acute systolic heart failure (HCC)    Allergy    Asthma    Bilateral shoulder pain    Borderline hypertension    Bronchitis    CAD (coronary artery disease)    Chronic systolic CHF (congestive heart failure) (HCC)    Chronic systolic congestive heart failure, NYHA class 3 ([place]) 07/24/2018   Diabetes mellitus    Diagnosed in 2000, on insulin, Trulicity and metformin, not checking cgs at home   Gangrenous appendicitis with perforation s/p lap appendectomy 10/04/2017 10/04/2017   GERD (gastroesophageal reflux disease)    History of MI (myocardial infarction)    Hyperlipidemia    Hypertension    Hypothyroidism    MGUS (monoclonal gammopathy of unknown significance)    Mitral regurgitation  Myocardial infarction ([place])    Neuropathy    Obesity    Sleep apnea    uses CPAP   Statin intolerance    Uterine cancer [facility])     Past Surgical History:  Procedure Laterality Date   BREATH TEK H PYLORI  09/18/2011   Procedure: BREATH TEK H  PYLORI;  Surgeon: Shann Medal, MD;  Location: [facility];  Service: General;  Laterality: N/A;   CESAREAN SECTION     x 3   IR IMAGING GUIDED PORT INSERTION  12/07/2021   LAPAROSCOPIC APPENDECTOMY N/A 10/03/2017   Procedure: APPENDECTOMY LAPAROSCOPIC;  Surgeon: Leighton Ruff, MD;  Location: [facility];  Service: General;  Laterality: N/A;   RIGHT/LEFT HEART CATH AND CORONARY ANGIOGRAPHY N/A 02/12/2018   Procedure: RIGHT/LEFT HEART CATH AND CORONARY ANGIOGRAPHY;  Surgeon: Jettie Booze, MD;  Location: [place] [facility];  Service: Cardiovascular;  Laterality: N/A;   TUBAL LIGATION      Family History  Problem Relation Age of Onset   Alcohol abuse Mother    Arthritis Mother    Diabetes Mother    Sleep apnea Mother    Anxiety disorder Mother    Eating disorder Mother    Obesity Mother    Cancer Father    Alcohol abuse Father    Heart disease Father    Hypertension Father    Stroke Father    Sleep apnea Sister    Breast cancer Neg Hx    Colon cancer Neg Hx    Ovarian cancer Neg Hx    Endometrial cancer Neg Hx    Pancreatic cancer Neg Hx    Prostate cancer Neg Hx     Social History   Socioeconomic History   Marital status: Married    Spouse name: Not on file   Number of children: Not on file   Years of education: Not on file   Highest education level: Not on file  Occupational History   Occupation: retired Brewing technologist, worked at [employer]  Tobacco Use   Smoking status: Former    Packs/day: 1.00    Years: 15.00    Pack years: 15.00    Types: Cigarettes    Quit date: 01/28/1990    Years since quitting: 32.0   Smokeless tobacco: Never  Vaping Use   Vaping Use: Never used  Substance and Sexual Activity   Alcohol use: Not Currently    Comment: occasional   Drug use: No   Sexual activity: Not Currently    Birth control/protection: Post-menopausal, Abstinence  Other Topics Concern   Not on file  Social History Narrative   Lives in [place]   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 wheezing, Disp: 18 g, Rfl: 2   aspirin 81 MG chewable tablet, Chew 1 tablet (81 mg total) by mouth daily., Disp: 90 tablet, Rfl: 3   bismuth subsalicylate (PEPTO BISMOL) 262  MG chewable tablet, Chew 524 mg by mouth as needed for indigestion or diarrhea or loose stools. , Disp: , Rfl:    blood glucose meter kit and supplies, Use up to four times daily as directed. (FOR ICD-10 E10.9, E11.9)., Disp: 1 each, Rfl: 99   carvedilol (COREG) 12.5 MG tablet, Patient was instructed to reduce to 1/2 tablet twice daily on 4/25 due to dehydration-NG, Disp: 180 tablet, Rfl: 3   diphenhydrAMINE (BENADRYL) 25 MG tablet, Take 25 mg by mouth at bedtime., Disp: , Rfl:    diphenoxylate-atropine (LOMOTIL) 2.5-0.025 MG tablet, Take 1 tablet by mouth 4 (four) times daily as needed for diarrhea or loose stools., Disp: 30 tablet, Rfl: 0   Dulaglutide (TRULICITY) 3 TS/1.7BL SOPN, Inject 3 mg into the skin once a week. (Patient taking differently: Inject 3 mg into the skin See admin instructions. 4 times a month), Disp: 6 mL, Rfl: 3   Evolocumab (REPATHA SURECLICK) 390 MG/ML SOAJ, Inject 1 pen into the skin every 14 (fourteen) days., Disp: 2 mL, Rfl: 11   furosemide (LASIX) 40 MG tablet, Take 1 tablet (40 mg total) by mouth daily., Disp: 90 tablet, Rfl: 3   glucose blood test strip, Use as directed to test blood sugar, Disp: 100 each, Rfl: 1   insulin glargine-yfgn (SEMGLEE,  YFGN,) 100 UNIT/ML Pen, Inject 25-40 Units into the skin daily based on blood sugar levels via sliding scale. (Patient taking differently: Inject 25-40 Units into the skin in the morning. Sliding scale), Disp: 15 mL, Rfl: 11   Insulin Pen Needle (UNIFINE PENTIPS) 32G X 4 MM MISC, USE AS DIRECTED ONCE A DAY, Disp: 100 each, Rfl: 3   levothyroxine (SYNTHROID) 50 MCG tablet, Take 1 tablet (50 mcg total) by mouth daily before breakfast., Disp: 90 tablet, Rfl: 0   lidocaine-prilocaine (EMLA) cream, Apply to affected area once (Patient taking differently: 1 application. as needed (to draw labs of infusion). Apply to affected area once), Disp: 30 g, Rfl: 3   magnesium oxide (MAG-OX) 400 (240 Mg) MG tablet, Take 1 tablet (400 mg total) by mouth daily. (Patient taking differently: Take 250 mg by mouth daily.), Disp: 30 tablet, Rfl: 0   metFORMIN (GLUCOPHAGE) 1000 MG tablet, Take 1 tablet (1,000 mg total) by mouth 2 (two) times daily with a meal., Disp: 180 tablet, Rfl: 3   Multiple Vitamin (MULITIVITAMIN WITH MINERALS) TABS, Take 1 tablet by mouth daily with breakfast., Disp: , Rfl:    ondansetron (ZOFRAN) 8 MG tablet, Take 1 tablet (8 mg total) by mouth 2 (two) times daily as needed. Start on the third day after chemotherapy., Disp: 30 tablet, Rfl: 1   OneTouch Delica Lancets 30S MISC, use as directed, Disp: 100 each, Rfl: 1   prochlorperazine (COMPAZINE) 10 MG tablet, Take 1 tablet (10 mg total) by mouth every 6 (six) hours as needed (Nausea or vomiting)., Disp: 30 tablet, Rfl: 1   sacubitril-valsartan (ENTRESTO) 49-51 MG, Take 1 tablet by mouth 2 (two) times daily., Disp: 60 tablet, Rfl: 4   spironolactone (ALDACTONE) 25 MG tablet, Take 1 tablet (25 mg total) by mouth daily., Disp: 90 tablet, Rfl: 3   triamcinolone ointment (KENALOG) 0.1 %, Apply 1 application topically two times daily to affected area(s) as needed, sparing use to avoid whitening/thinning skin, Disp: 30 g, Rfl: 1  Review of Systems: +  fatigue Denies appetite changes, fevers, chills, unexplained weight changes. Denies hearing loss, neck lumps or masses, mouth sores, ringing in ears or voice changes. Denies  cough or wheezing.  Denies shortness of breath. Denies chest pain or palpitations. Denies leg swelling. Denies abdominal distention, pain, blood in stools, constipation, diarrhea, nausea, vomiting, or early satiety. Denies pain with intercourse, dysuria, frequency, hematuria or incontinence. Denies hot flashes, pelvic pain, vaginal bleeding or vaginal discharge.   Denies joint pain, back pain or muscle pain/cramps. Denies itching, rash, or wounds. Denies dizziness, headaches, numbness or seizures. Denies swollen lymph nodes or glands, denies easy bruising or bleeding. Denies anxiety, depression, confusion, or decreased concentration.  Physical Exam: BP 108/73 (BP Location: Left Arm, Patient Position: Sitting)   Pulse 93   Temp 97.8 F (36.6 C) (Oral)   Ht 5' 7"  (1.702 m)   Wt 279 lb (126.6 kg)   SpO2 100%   BMI 43.70 kg/m  General: Alert, oriented, no acute distress. HEENT: Normocephalic, atraumatic, sclera anicteric. Chest: Unlabored breathing on room air.  Laboratory & Radiologic Studies: Hemoglobin A1c today was 8.6%  Assessment & Plan: Terri Heath is a 66 y.o. 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  The patient has tolerated treatment well. She has had significant improvement in her glycemic control over the last couple of months. She's also been able to lose approximately 10 pounds.   On recent post radiation MRI, I do not appreciate evidence of parametrial involvement or extrauterine disease. I reviewed images with the reading radiologist.  We will plan to proceed with planning HDR treatment next week. I will examine her at the time. Given response on imaging, I think she is a candidate  for proceeding with definitive surgery with total hysterectomy and BSO. Will plan for modified brachytherapy regimen and interval surgery 6 weeks after.   I stressed the importance of continued work on glycemic control. Ideally, her cbgs will all be under 180-200 around the time of surgery. I will have the office request pre-op clearance from PCP and cardiology. Any additional weight loss she is able to achieve between now and surgery will help decrease morbidity of surgery.  The patient will be scheduled for surgery in mid to late July and will return for preoperative visit closer to surgery.  32 minutes of total time was spent for this patient encounter, including preparation, face-to-face counseling with the patient and coordination of care, and documentation of the encounter.  Jeral Pinch, MD  Division of Gynecologic Oncology  Department of Obstetrics and Gynecology  [facility]

## 2022-01-30 ENCOUNTER — Ambulatory Visit (HOSPITAL_COMMUNITY): Payer: 59

## 2022-01-30 ENCOUNTER — Telehealth: Payer: Self-pay | Admitting: *Deleted

## 2022-01-30 NOTE — Telephone Encounter (Signed)
   Pre-operative Risk Assessment    Patient Name: Terri Heath  DOB: Jan 16, 1956 MRN: [id number]      Request for Surgical Clearance    Procedure:   ROBOTIC ASSISTED LAPAROSCOPIC TOTAL HYSTERECTOMY, B/L SALPINGO-OOPHORECTOMY  Date of Surgery:  Clearance 03/22/22                                 Surgeon:  DR. Jeral Pinch Surgeon's Group or Practice Name:  [facility] Phone number:  [phone] Fax number:  [phone]   Type of Clearance Requested:   - Medical ; ASA   Type of Anesthesia:  General    Additional requests/questions:    Jiles Prows   01/30/2022, 3:19 PM

## 2022-01-30 NOTE — Telephone Encounter (Signed)
Per Dr Berline Lopes, fax records and surgical optimization forms to the patient's PCP and cardiology offices

## 2022-01-30 NOTE — Anesthesia Preprocedure Evaluation (Signed)
Anesthesia Evaluation  Patient identified by MRN, date of birth, ID band Patient awake    Reviewed: Allergy & Precautions, NPO status , Patient's Chart, lab work & pertinent test results  Airway Mallampati: II  TM Distance: >3 FB Neck ROM: Full    Dental  (+) Dental Advisory Given   Pulmonary asthma , sleep apnea and Continuous Positive Airway Pressure Ventilation , former smoker,    breath sounds clear to auscultation       Cardiovascular hypertension, Pt. on medications and Pt. on home beta blockers + CAD, + Past MI and +CHF   Rhythm:Regular Rate:Normal     Neuro/Psych  Neuromuscular disease    GI/Hepatic Neg liver ROS, GERD  ,  Endo/Other  diabetes, Type 2, Insulin Dependent, Oral Hypoglycemic AgentsHypothyroidism Morbid obesity  Renal/GU negative Renal ROS     Musculoskeletal   Abdominal   Peds  Hematology  (+) Blood dyscrasia, anemia ,   Anesthesia Other Findings   Reproductive/Obstetrics                            Lab Results  Component Value Date   WBC 4.7 01/16/2022   HGB 11.9 (L) 01/16/2022   HCT 34.9 (L) 01/16/2022   MCV 85.5 01/16/2022   PLT 257 01/16/2022   Lab Results  Component Value Date   CREATININE 0.88 01/16/2022   BUN 22 01/16/2022   NA 134 (L) 01/16/2022   K 4.7 01/16/2022   CL 100 01/16/2022   CO2 26 01/16/2022    Anesthesia Physical Anesthesia Plan  ASA: 3  Anesthesia Plan: General   Post-op Pain Management: Tylenol PO (pre-op)* and Toradol IV (intra-op)*   Induction: Intravenous  PONV Risk Score and Plan: 3 and Dexamethasone, Ondansetron, Midazolam and Treatment may vary due to age or medical condition  Airway Management Planned: LMA  Additional Equipment: None  Intra-op Plan:   Post-operative Plan: Extubation in OR  Informed Consent: I have reviewed the patients History and Physical, chart, labs and discussed the procedure including the  risks, benefits and alternatives for the proposed anesthesia with the patient or authorized representative who has indicated his/her understanding and acceptance.     Dental advisory given  Plan Discussed with: CRNA  Anesthesia Plan Comments: ( )       Anesthesia Quick Evaluation

## 2022-01-30 NOTE — Progress Notes (Signed)
Anesthesia Chart Review:   Case: [id number] Date/Time: 02/01/22 0815   Procedures:      TANDEM RING INSERTION     OPERATIVE ULTRASOUND   Anesthesia type: General   Pre-op diagnosis: ENDOMETRIAL CANCER   Location: [facility] 08 / [facility]   Surgeons: Gery Pray, MD       DISCUSSION: Pt is 66 years old with hx CAD (distal RCA and mid LCx totally occluded by 2019 cath, no intervention), chronic CHF, primarily ischemic cardiomyopathy (EF was 25% in 2019, now improved to 50%), MGUS, HTN, DM, OSA  VS: BP (!) 100/51   Pulse 100   Temp 36.9 C (Oral)   Resp 16   Ht 5' 6.5" (1.689 m)   Wt 126.6 kg   SpO2 98%   BMI 44.36 kg/m   PROVIDERS: - PCP is Early, Coralee Pesa, NP - HF cardiologist is Loralie Champagne, MD - Has seen EP cardiologist Thompson Grayer, MD and declined ICD.   LABS:  - HbA2c 8.6.  - CBC w/diff 01/16/22: H/H 11.9/34.9 - BMP 01/16/22: Na 134, glucose 191 - Magnesium 01/16/22: 1.6  (all labs ordered are listed, but only abnormal results are displayed)  Labs Reviewed  HEMOGLOBIN A1C - Abnormal; Notable for the following components:      Result Value   Hgb A1c MFr Bld 8.6 (*)    All other components within normal limits  GLUCOSE, CAPILLARY - Abnormal; Notable for the following components:   Glucose-Capillary 165 (*)    All other components within normal limits  SURGICAL PCR SCREEN    EKG 01/12/22: NSR. Low voltage QRS. Inferior infarct , age undetermined. Cannot rule out Anterior infarct (cited on or before 23-Jun-2021). Unchanged from prior   CV: Echo 06/23/21:  1. Left ventricular ejection fraction, by estimation, is 50%. The left ventricle has mildly decreased function. The left ventricle demonstrates regional wall motion abnormalities, there appeared to be inferolateral hypokinesis. Left ventricular diastolic parameters are consistent with Grade I diastolic dysfunction (impaired relaxation).  2. Right ventricular systolic function is normal. The right ventricular size is  normal. There is normal pulmonary artery systolic pressure. The estimated right ventricular systolic pressure is 61.4 mmHg.  3. The mitral valve is normal in structure. No evidence of mitral valve regurgitation. No evidence of mitral stenosis.  4. The aortic valve was not well visualized. Aortic valve regurgitation is not visualized. No aortic stenosis is present.  5. The inferior vena cava is normal in size with greater than 50% respiratory variability, suggesting right atrial pressure of 3 mmHg.  6. Technically difficult study. Would use Definity if she has an echo in the future.   R/L cardiac cath 02/12/18:  Dist RCA lesion is 100% stenosed. Left to right collaterals Mid Cx lesion is 100% stenosed. Ost 2nd Diag to 2nd Diag lesion is 25% stenosed. Mid LAD lesion is 25% stenosed. There is severe left ventricular systolic dysfunction. LV end diastolic pressure is moderately elevated. The left ventricular ejection fraction is less than 25% by visual estimate. There is no aortic valve stenosis. Hemodynamic findings consistent with moderate pulmonary hypertension. CO 5.5 L/min; CI 2.3; mean PA sat 42 mm Hg; mean PCWP 29 mm Hg; Ao sat 97%; PA sat 69% - CAD does not explain extent of cardiomyopathy.  Will continue medical therapy.  Will refer to heart failure clinic.     Past Medical History:  Diagnosis Date   Acute systolic heart failure (HCC)    Allergy    Asthma  Bilateral shoulder pain    Borderline hypertension    Bronchitis    CAD (coronary artery disease)    Chronic systolic CHF (congestive heart failure) (HCC)    Chronic systolic congestive heart failure, NYHA class 3 ([place]) 07/24/2018   Diabetes mellitus    Diagnosed in 2000, on insulin, Trulicity and metformin, not checking cgs at home   Gangrenous appendicitis with perforation s/p lap appendectomy 10/04/2017 10/04/2017   GERD (gastroesophageal reflux disease)    History of MI (myocardial infarction)    Hyperlipidemia     Hypertension    Hypothyroidism    MGUS (monoclonal gammopathy of unknown significance)    Mitral regurgitation    Myocardial infarction ([place])    Neuropathy    Obesity    Sleep apnea    uses CPAP   Statin intolerance    Uterine cancer [facility])     Past Surgical History:  Procedure Laterality Date   BREATH TEK H PYLORI  09/18/2011   Procedure: BREATH TEK H PYLORI;  Surgeon: Shann Medal, MD;  Location: [facility];  Service: General;  Laterality: N/A;   CESAREAN SECTION     x 3   IR IMAGING GUIDED PORT INSERTION  12/07/2021   LAPAROSCOPIC APPENDECTOMY N/A 10/03/2017   Procedure: APPENDECTOMY LAPAROSCOPIC;  Surgeon: Leighton Ruff, MD;  Location: [facility];  Service: General;  Laterality: N/A;   RIGHT/LEFT HEART CATH AND CORONARY ANGIOGRAPHY N/A 02/12/2018   Procedure: RIGHT/LEFT HEART CATH AND CORONARY ANGIOGRAPHY;  Surgeon: Jettie Booze, MD;  Location: [facility];  Service: Cardiovascular;  Laterality: N/A;   TUBAL LIGATION      MEDICATIONS:  acetaminophen (TYLENOL) 500 MG tablet   albuterol (PROAIR HFA) 108 (90 Base) MCG/ACT inhaler   aspirin 81 MG chewable tablet   bismuth subsalicylate (PEPTO BISMOL) 262 MG chewable tablet   blood glucose meter kit and supplies   carvedilol (COREG) 12.5 MG tablet   diphenhydrAMINE (BENADRYL) 25 MG tablet   diphenoxylate-atropine (LOMOTIL) 2.5-0.025 MG tablet   Dulaglutide (TRULICITY) 3 UE/2.8MK SOPN   Evolocumab (REPATHA SURECLICK) 349 MG/ML SOAJ   furosemide (LASIX) 40 MG tablet   glucose blood test strip   insulin glargine-yfgn (SEMGLEE, YFGN,) 100 UNIT/ML Pen   Insulin Pen Needle (UNIFINE PENTIPS) 32G X 4 MM MISC   levothyroxine (SYNTHROID) 50 MCG tablet   lidocaine-prilocaine (EMLA) cream   magnesium oxide (MAG-OX) 400 (240 Mg) MG tablet   metFORMIN (GLUCOPHAGE) 1000 MG tablet   Multiple Vitamin (MULITIVITAMIN WITH MINERALS) TABS   ondansetron (ZOFRAN) 8 MG tablet   OneTouch Delica Lancets 17H MISC   prochlorperazine  (COMPAZINE) 10 MG tablet   sacubitril-valsartan (ENTRESTO) 49-51 MG   spironolactone (ALDACTONE) 25 MG tablet   triamcinolone ointment (KENALOG) 0.1 %   No current facility-administered medications for this encounter.    If no changes, I anticipate pt can proceed with surgery as scheduled.   Willeen Cass, PhD, FNP-BC [facility]/Anesthesiology Phone: [phone] 01/30/2022 11:09 AM

## 2022-01-31 NOTE — Telephone Encounter (Signed)
    Patient Name: Terri Heath  DOB: [birth year] MRN: [id number]  Primary Cardiologist: Dr. Aundra Dubin  Chart reviewed as part of pre-operative protocol coverage.  Dr. Aundra Dubin, you recently saw the patient 01/12/22. Please provider your recommendations regarding clearance and holding ASA. Please forward your response to [facility] PREOP.   Thank you   Leanor Kail, PA 01/31/2022, 4:32 PM

## 2022-01-31 NOTE — Progress Notes (Signed)
  Radiation Oncology         ([phone]) [phone] ________________________________  Name: Terri Heath MRN: [id number]  Date: 02/01/2022  DOB: 1955/12/23  CC: Orma Render, NP  [name], MD  HDR BRACHYTHERAPY NOTE  DIAGNOSIS: The encounter diagnosis was Endometrial cancer [facility]).   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 Identity was confirmed. All relevant records and images related to the planned course of therapy were reviewed. The patient freely provided informed written consent to proceed with treatment after reviewing the details related to the planned course of therapy. The consent form was witnessed and verified by the simulation staff. Then, the patient was set-up in a stable reproducible supine position for radiation therapy. The tandem ring system was accessed and fiducial markers were placed within the tandem and ring.   Simple treatment device note: On the operating room the patient had construction of her custom tandem ring system. She will be treated with a 45 tandem/ring system. The patient had placement of a 40 mm tandem. A cervical ring with a small shielding was used for her treatment. A rectal paddle was also part of her custom set up device.  Verification simulation note: An AP and lateral film was obtained through the pelvis area. This was compared to the patient's planning films documenting accurate position of the tandem/ring system for treatment.  High-dose-rate brachytherapy treatment note:   The remote afterloading device was accessed through catheter system and attached to the tandem ring system. Patient then proceeded to undergo her first high-dose-rate treatment directed at the lower uterine segment/cervix. The patient was prescribed a dose of 5.5 gray to be delivered to the [place].Marland Kitchen Patient was treated with 2 channels using 24 dwell positions. Treatment time was 478.8 seconds. The  patient tolerated the procedure well. After completion of her therapy, a radiation survey was performed documenting return of the iridium source into the [employer] safe. The patient was then transferred to the nursing suite.  She then had removal of the rectal paddle followed by the tandem and ring system. The patient tolerated the removal well.  PLAN: She has completed her planned course of preoperative radiation therapy.  She will proceed to surgery in 4 to 6 weeks.  Routine follow-up in radiation oncology in 3 to 4 weeks.    ________________________________   -----------------------------------  Blair Promise, PhD, MD  This document serves as a record of services personally performed by Gery Pray, MD. It was created on his behalf by Roney Mans, a trained medical scribe. The creation of this record is based on the scribe's personal observations and the provider's statements to them. This document has been checked and approved by the attending provider.

## 2022-01-31 NOTE — Telephone Encounter (Signed)
[place] for surgery.  Can hold ASA prior if needed.

## 2022-02-01 ENCOUNTER — Other Ambulatory Visit: Payer: Self-pay

## 2022-02-01 ENCOUNTER — Ambulatory Visit
Admission: RE | Admit: 2022-02-01 | Discharge: 2022-02-01 | Disposition: A | Discharge status: Home / Self Care | Payer: 59 | Source: Ambulatory Visit | Attending: Radiation Oncology | Admitting: Radiation Oncology

## 2022-02-01 ENCOUNTER — Ambulatory Visit (HOSPITAL_COMMUNITY)
Admission: RE | Admit: 2022-02-01 | Discharge: 2022-02-01 | Disposition: A | Discharge status: Home / Self Care | Payer: Medicare HMO | Attending: Radiation Oncology | Admitting: Radiation Oncology

## 2022-02-01 ENCOUNTER — Encounter (HOSPITAL_COMMUNITY): Payer: Self-pay | Admitting: Radiation Oncology

## 2022-02-01 ENCOUNTER — Ambulatory Visit (HOSPITAL_BASED_OUTPATIENT_CLINIC_OR_DEPARTMENT_OTHER): Payer: Medicare HMO | Admitting: Certified Registered"

## 2022-02-01 ENCOUNTER — Ambulatory Visit (HOSPITAL_COMMUNITY)
Admission: RE | Admit: 2022-02-01 | Discharge: 2022-02-01 | Disposition: A | Discharge status: Home / Self Care | Payer: Medicare HMO | Source: Ambulatory Visit | Attending: Radiation Oncology | Admitting: Radiation Oncology

## 2022-02-01 ENCOUNTER — Encounter (HOSPITAL_COMMUNITY)
Admission: RE | Disposition: A | Discharge status: Home / Self Care | Payer: Self-pay | Source: Home / Self Care | Attending: Radiation Oncology

## 2022-02-01 ENCOUNTER — Other Ambulatory Visit (HOSPITAL_COMMUNITY): Payer: Self-pay

## 2022-02-01 ENCOUNTER — Ambulatory Visit (HOSPITAL_COMMUNITY): Payer: Medicare HMO | Admitting: Emergency Medicine

## 2022-02-01 ENCOUNTER — Encounter: Payer: Self-pay | Admitting: Radiation Oncology

## 2022-02-01 VITALS — BP 120/80 | HR 90 | Temp 97.7°F | Resp 20

## 2022-02-01 DIAGNOSIS — I2510 Atherosclerotic heart disease of native coronary artery without angina pectoris: Secondary | ICD-10-CM | POA: Insufficient documentation

## 2022-02-01 DIAGNOSIS — G4730 Sleep apnea, unspecified: Secondary | ICD-10-CM | POA: Diagnosis not present

## 2022-02-01 DIAGNOSIS — G4733 Obstructive sleep apnea (adult) (pediatric): Secondary | ICD-10-CM | POA: Diagnosis not present

## 2022-02-01 DIAGNOSIS — C541 Malignant neoplasm of endometrium: Secondary | ICD-10-CM

## 2022-02-01 DIAGNOSIS — I110 Hypertensive heart disease with heart failure: Secondary | ICD-10-CM | POA: Insufficient documentation

## 2022-02-01 DIAGNOSIS — Z7984 Long term (current) use of oral hypoglycemic drugs: Secondary | ICD-10-CM | POA: Insufficient documentation

## 2022-02-01 DIAGNOSIS — Z794 Long term (current) use of insulin: Secondary | ICD-10-CM | POA: Diagnosis not present

## 2022-02-01 DIAGNOSIS — I252 Old myocardial infarction: Secondary | ICD-10-CM | POA: Diagnosis not present

## 2022-02-01 DIAGNOSIS — K219 Gastro-esophageal reflux disease without esophagitis: Secondary | ICD-10-CM | POA: Insufficient documentation

## 2022-02-01 DIAGNOSIS — Z6841 Body Mass Index (BMI) 40.0 and over, adult: Secondary | ICD-10-CM | POA: Insufficient documentation

## 2022-02-01 DIAGNOSIS — I509 Heart failure, unspecified: Secondary | ICD-10-CM | POA: Diagnosis not present

## 2022-02-01 DIAGNOSIS — E119 Type 2 diabetes mellitus without complications: Secondary | ICD-10-CM | POA: Diagnosis not present

## 2022-02-01 DIAGNOSIS — Z87891 Personal history of nicotine dependence: Secondary | ICD-10-CM | POA: Insufficient documentation

## 2022-02-01 DIAGNOSIS — Z01818 Encounter for other preprocedural examination: Secondary | ICD-10-CM

## 2022-02-01 DIAGNOSIS — Z9989 Dependence on other enabling machines and devices: Secondary | ICD-10-CM | POA: Diagnosis not present

## 2022-02-01 DIAGNOSIS — E039 Hypothyroidism, unspecified: Secondary | ICD-10-CM

## 2022-02-01 DIAGNOSIS — J45909 Unspecified asthma, uncomplicated: Secondary | ICD-10-CM | POA: Diagnosis not present

## 2022-02-01 HISTORY — PX: TANDEM RING INSERTION: SHX6199

## 2022-02-01 HISTORY — PX: OPERATIVE ULTRASOUND: SHX5996

## 2022-02-01 LAB — URINALYSIS, COMPLETE (UACMP) WITH MICROSCOPIC
Bacteria, UA: NONE SEEN
Bilirubin Urine: NEGATIVE
Glucose, UA: NEGATIVE mg/dL
Ketones, ur: NEGATIVE mg/dL
Leukocytes,Ua: NEGATIVE
Nitrite: NEGATIVE
Protein, ur: NEGATIVE mg/dL
Specific Gravity, Urine: 1.025 (ref 1.005–1.030)
pH: 5 (ref 5.0–8.0)

## 2022-02-01 LAB — RAD ONC ARIA SESSION SUMMARY
Course Elapsed Days: 49
Plan Fractions Treated to Date: 1
Plan Prescribed Dose Per Fraction: 5.5 Gy
Plan Total Fractions Prescribed: 1
Plan Total Prescribed Dose: 5.5 Gy
Reference Point Dosage Given to Date: 10.4607 Gy
Reference Point Dosage Given to Date: 10.484 Gy
Reference Point Dosage Given to Date: 4.6531 Gy
Reference Point Dosage Given to Date: 4.7679 Gy
Reference Point Dosage Given to Date: 5.5 Gy
Reference Point Session Dosage Given: 10.4607 Gy
Reference Point Session Dosage Given: 10.484 Gy
Reference Point Session Dosage Given: 4.6531 Gy
Reference Point Session Dosage Given: 4.7679 Gy
Reference Point Session Dosage Given: 5.5 Gy
Session Number: 26

## 2022-02-01 LAB — GLUCOSE, CAPILLARY
Glucose-Capillary: 182 mg/dL — ABNORMAL HIGH (ref 70–99)
Glucose-Capillary: 153 mg/dL — ABNORMAL HIGH (ref 70–99)

## 2022-02-01 SURGERY — INSERTION, UTERINE TANDEM AND RING OR CYLINDER, FOR BRACHYTHERAPY
Anesthesia: General

## 2022-02-01 MED ORDER — LIDOCAINE 2% (20 MG/ML) 5 ML SYRINGE
INTRAMUSCULAR | Status: DC | PRN
  Administered 2022-02-01: 60 mg via INTRAVENOUS

## 2022-02-01 MED ORDER — HYDROMORPHONE HCL 1 MG/ML IJ SOLN
1.0000 mg | Freq: Once | INTRAMUSCULAR | Status: AC
  Administered 2022-02-01: 1 mg via INTRAVENOUS
  Filled 2022-02-01: qty 1

## 2022-02-01 MED ORDER — ORAL CARE MOUTH RINSE: 15.0000 mL | Freq: Once | OROMUCOSAL | Status: AC

## 2022-02-01 MED ORDER — AMISULPRIDE (ANTIEMETIC) 5 MG/2ML IV SOLN: 10.0000 mg | Freq: Once | INTRAVENOUS | Status: DC | PRN

## 2022-02-01 MED ORDER — HYDROMORPHONE HCL 2 MG PO TABS
2.0000 mg | ORAL_TABLET | Freq: Four times a day (QID) | ORAL | 0 refills | Status: DC | PRN
  Filled 2022-02-01: qty 10, 3d supply, fill #0

## 2022-02-01 MED ORDER — HYDROMORPHONE HCL 1 MG/ML IJ SOLN
INTRAMUSCULAR | Status: AC
  Filled 2022-02-01: qty 1

## 2022-02-01 MED ORDER — LACTATED RINGERS IV SOLN
INTRAVENOUS | Status: DC
  Filled 2022-02-01 (×2): qty 250

## 2022-02-01 MED ORDER — ONDANSETRON HCL 4 MG/2ML IJ SOLN
INTRAMUSCULAR | Status: DC | PRN
  Administered 2022-02-01: 4 mg via INTRAVENOUS

## 2022-02-01 MED ORDER — FENTANYL CITRATE (PF) 100 MCG/2ML IJ SOLN
INTRAMUSCULAR | Status: AC
  Filled 2022-02-01: qty 2

## 2022-02-01 MED ORDER — FENTANYL CITRATE (PF) 100 MCG/2ML IJ SOLN
INTRAMUSCULAR | Status: DC | PRN
  Administered 2022-02-01: 25 ug via INTRAVENOUS
  Administered 2022-02-01: 50 ug via INTRAVENOUS
  Administered 2022-02-01: 25 ug via INTRAVENOUS

## 2022-02-01 MED ORDER — STERILE WATER FOR IRRIGATION IR SOLN
Status: DC | PRN
  Administered 2022-02-01: 500 mL

## 2022-02-01 MED ORDER — POVIDONE-IODINE 10 % EX SWAB
2.0000 "application " | Freq: Once | CUTANEOUS | Status: AC
  Administered 2022-02-01: 2 via TOPICAL

## 2022-02-01 MED ORDER — DEXAMETHASONE SODIUM PHOSPHATE 10 MG/ML IJ SOLN
INTRAMUSCULAR | Status: DC | PRN
  Administered 2022-02-01: 10 mg via INTRAVENOUS

## 2022-02-01 MED ORDER — CHLORHEXIDINE GLUCONATE 0.12 % MT SOLN
15.0000 mL | Freq: Once | OROMUCOSAL | Status: AC
  Administered 2022-02-01: 15 mL via OROMUCOSAL

## 2022-02-01 MED ORDER — ESTRADIOL 0.1 MG/GM VA CREA
TOPICAL_CREAM | VAGINAL | Status: AC
  Filled 2022-02-01: qty 42.5

## 2022-02-01 MED ORDER — MIDAZOLAM HCL 5 MG/5ML IJ SOLN
INTRAMUSCULAR | Status: DC | PRN
  Administered 2022-02-01: 2 mg via INTRAVENOUS

## 2022-02-01 MED ORDER — HYDROMORPHONE HCL 1 MG/ML IJ SOLN
0.5000 mg | Freq: Once | INTRAMUSCULAR | Status: AC
  Administered 2022-02-01: 0.5 mg via INTRAVENOUS
  Filled 2022-02-01: qty 1

## 2022-02-01 MED ORDER — ACETAMINOPHEN 500 MG PO TABS
1000.0000 mg | ORAL_TABLET | Freq: Once | ORAL | Status: AC
  Administered 2022-02-01: 1000 mg via ORAL
  Filled 2022-02-01: qty 2

## 2022-02-01 MED ORDER — LACTATED RINGERS IV SOLN: INTRAVENOUS | Status: DC

## 2022-02-01 MED ORDER — SODIUM CHLORIDE 0.9 % IR SOLN
Status: DC | PRN
  Administered 2022-02-01: 1000 mL via INTRAVESICAL

## 2022-02-01 MED ORDER — PHENYLEPHRINE 80 MCG/ML (10ML) SYRINGE FOR IV PUSH (FOR BLOOD PRESSURE SUPPORT)
PREFILLED_SYRINGE | INTRAVENOUS | Status: DC | PRN
  Administered 2022-02-01 (×6): 80 ug via INTRAVENOUS

## 2022-02-01 MED ORDER — DEXAMETHASONE SODIUM PHOSPHATE 10 MG/ML IJ SOLN
INTRAMUSCULAR | Status: AC
  Filled 2022-02-01: qty 1

## 2022-02-01 MED ORDER — ONDANSETRON HCL 4 MG/2ML IJ SOLN
INTRAMUSCULAR | Status: AC
  Filled 2022-02-01: qty 2

## 2022-02-01 MED ORDER — PROPOFOL 10 MG/ML IV BOLUS
INTRAVENOUS | Status: DC | PRN
  Administered 2022-02-01: 150 mg via INTRAVENOUS

## 2022-02-01 MED ORDER — PROPOFOL 10 MG/ML IV BOLUS
INTRAVENOUS | Status: AC
  Filled 2022-02-01: qty 20

## 2022-02-01 MED ORDER — LIDOCAINE HCL (PF) 2 % IJ SOLN
INTRAMUSCULAR | Status: AC
  Filled 2022-02-01: qty 5

## 2022-02-01 MED ORDER — FENTANYL CITRATE PF 50 MCG/ML IJ SOSY: 25.0000 ug | PREFILLED_SYRINGE | INTRAMUSCULAR | Status: DC | PRN

## 2022-02-01 MED ORDER — MIDAZOLAM HCL 2 MG/2ML IJ SOLN
INTRAMUSCULAR | Status: AC
  Filled 2022-02-01: qty 2

## 2022-02-01 SURGICAL SUPPLY — 35 items
BAG COUNTER SPONGE SURGICOUNT (BAG) IMPLANT
BAG DRN RND TRDRP ANRFLXCHMBR (UROLOGICAL SUPPLIES) ×1
BAG SPNG CNTER NS LX DISP (BAG)
BAG URINE DRAIN 2000ML AR STRL (UROLOGICAL SUPPLIES) ×2 IMPLANT
CATH FOLEY 2WAY SLVR  5CC 16FR (CATHETERS)
CATH FOLEY 2WAY SLVR 5CC 16FR (CATHETERS) IMPLANT
CATH FOLEY 3WAY  5CC 16FR (CATHETERS)
CATH FOLEY 3WAY 5CC 16FR (CATHETERS) IMPLANT
COVER BACK TABLE 60X90IN (DRAPES) ×2 IMPLANT
COVER SURGICAL LIGHT HANDLE (MISCELLANEOUS) ×1 IMPLANT
DILATOR CANAL MILEX (MISCELLANEOUS) ×1 IMPLANT
DRAPE SHEET LG 3/4 BI-LAMINATE (DRAPES) ×2 IMPLANT
DRAPE UNDERBUTTOCKS STRL (DISPOSABLE) ×2 IMPLANT
DRSG PAD ABDOMINAL 8X10 ST (GAUZE/BANDAGES/DRESSINGS) ×3 IMPLANT
GAUZE 4X4 16PLY ~~LOC~~+RFID DBL (SPONGE) ×2 IMPLANT
GLOVE BIO SURGEON STRL SZ 6 (GLOVE) ×4 IMPLANT
GLOVE BIO SURGEON STRL SZ7.5 (GLOVE) ×4 IMPLANT
GOWN STRL REUS W/ TWL LRG LVL3 (GOWN DISPOSABLE) ×1 IMPLANT
GOWN STRL REUS W/TWL LRG LVL3 (GOWN DISPOSABLE) ×2
KIT BASIN OR (CUSTOM PROCEDURE TRAY) ×2 IMPLANT
LEGGING LITHOTOMY PAIR STRL (DRAPES) ×2 IMPLANT
MAT PREVALON FULL STRYKER (MISCELLANEOUS) ×2 IMPLANT
PACKING VAGINAL (PACKING) ×2 IMPLANT
PENCIL SMOKE EVACUATOR (MISCELLANEOUS) IMPLANT
PLUG CATH AND CAP STER (CATHETERS) IMPLANT
SET IRRIG Y TYPE TUR BLADDER L (SET/KITS/TRAYS/PACK) IMPLANT
SURGILUBE 2OZ TUBE FLIPTOP (MISCELLANEOUS) ×2 IMPLANT
SUT PROLENE 2 0 CT2 30 (SUTURE) IMPLANT
SUT VIC AB 0 CT1 27 (SUTURE)
SUT VIC AB 0 CT1 27XBRD ANTBC (SUTURE) IMPLANT
SYR 10ML LL (SYRINGE) ×2 IMPLANT
TOWEL OR 17X26 10 PK STRL BLUE (TOWEL DISPOSABLE) ×2 IMPLANT
UNDERPAD 30X36 HEAVY ABSORB (UNDERPADS AND DIAPERS) ×4 IMPLANT
WATER STERILE IRR 500ML POUR (IV SOLUTION) ×2 IMPLANT
YANKAUER SUCT BULB TIP 10FT TU (MISCELLANEOUS) ×2 IMPLANT

## 2022-02-01 NOTE — Anesthesia Procedure Notes (Signed)
Procedure Name: LMA Insertion Date/Time: 02/01/2022 8:41 AM Performed by: Jazon Jipson D, CRNA Pre-anesthesia Checklist: Patient identified, Emergency Drugs available, Suction available and Patient being monitored Patient Re-evaluated:Patient Re-evaluated prior to induction Oxygen Delivery Method: Circle system utilized Preoxygenation: Pre-oxygenation with 100% oxygen Induction Type: IV induction Ventilation: Mask ventilation without difficulty LMA: LMA inserted LMA Size: 4.0 Tube type: Oral Number of attempts: 1 Placement Confirmation: positive ETCO2 and breath sounds checked- equal and bilateral Tube secured with: Tape Dental Injury: Teeth and Oropharynx as per pre-operative assessment

## 2022-02-01 NOTE — Brief Op Note (Signed)
02/01/2022  10:05 AM  PATIENT:  Terri Heath  66 y.o. female  PRE-OPERATIVE DIAGNOSIS:  ENDOMETRIAL CANCER  POST-OPERATIVE DIAGNOSIS:  ENDOMETRIAL CANCER  PROCEDURE:  Procedure(s): TANDEM RING INSERTION (N/A) OPERATIVE ULTRASOUND (N/A)  SURGEON:  Surgeon(s) and Role:    * Gery Pray, MD - Primary    * [name], MD  PHYSICIAN ASSISTANT:   ASSISTANTS: none   ANESTHESIA:   general  EBL:  5 mL  BLOOD ADMINISTERED:none  DRAINS: Urinary Catheter (Foley)   LOCAL MEDICATIONS USED:  NONE  SPECIMEN:  No Specimen  DISPOSITION OF SPECIMEN:  N/A  COUNTS:  YES  TOURNIQUET:  * No tourniquets in log *  DICTATION: The patient was taken to main OR at [facility].  A general anesthetic was delivered by LMA.  Dr. Berline Lopes performed a pelvic exam which revealed no extension into the cervical region.  The cervical region palpated normal in size.  There was no obvious parametrial extension.  Patient was then prepped and draped in the usual sterile fashion and placed in the dorsolithotomy position.  Trendelenburg position was used for the procedure which patient tolerated well.  Timeout for the procedure, antibiotics and preoperative medications was performed.  On pelvic exam there was some erythema noted to the cervical area consistent with external beam radiation treatments.  No visible lesion.  The cervix palpated normal in size.  No obvious parametrial extension.  Patient had a Foley catheter placed.  The bladder was then backfilled with approximately 200 cc of sterile water for ultrasound imaging purposes.  On ultrasound the uterus was mildly anteverted.  The uterus did not appear to be extremely enlarged.  She then proceeded to undergo dilation of the cervical os.  There was significant scar tissue in the upper cervical area and  lower uterine segment and adequate dilation could not be performed.  After several attempts a 60 mm tandem but could not be placed.  Patient  then had placement of a 60 mm, 45 degree tandem within the lower uterine segment and cervical region.  A cervical sleeve could not be placed due to the significant scar tissue.  She then had placement of a 45 degree cervical ring with small shielding cap in place.  This was then followed by placement of a rectal paddle posteriorly to limit dose to the rectal mucosa.  She  tolerated the procedure well.  She was subsequently taken to the recovery room in stable condition.  Later in the day the patient will be brought down to radiation oncology for planning and her first and only high-dose-rate treatment.  She  is to receive approximately 5.5 Pearline Cables to the high risk clinical target volume.  Iridium 192 will be the high-dose-rate source.  PLAN OF CARE:  Transfer to radiation oncology for planning and treatment  PATIENT DISPOSITION:  PACU - hemodynamically stable.   Delay start of Pharmacological VTE agent (>24hrs) due to surgical blood loss or risk of bleeding: not applicable

## 2022-02-01 NOTE — H&P (Signed)
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 She has had significant improvement in her glycemic control over the last couple of months. She's also been able to lose approximately 10 pounds.   On recent post radiation MRI, there is no evidence of parametrial involvement or extrauterine disease.    We will plan to proceed with  HDR treatment . Dr, Berline Lopes will examine her at the time. Given response on imaging, I think she is a candidate for proceeding with definitive surgery with total hysterectomy and BSO. Will plan for modified brachytherapy regimen and interval surgery 6 weeks after brachytherapy.   The patient developed postmenopausal bleeding on 09/29/2021 which began as spotting with some mild abdominal cramping. This continued until 10/06/2021 when her bleeding became significantly heavier. Accordingly, she met virtually with her PCP on 10/11/21 for further evaluation. During this visit, she reported severe cramping and passage of clots lasting until 10/09/2021. The day after, the cramping resolved and she only noticed little to no spotting in the following weeks. Before she could meet with her gynecologist, she returned to her PCP on 10/25/21 with recurrence of heavy bleeding with clots (requiring 1-3 sanitary pads/day), and cramping. She also endorsed new onset of mood swings, headaches, and low energy levels.   Subsequent transvaginal US performed for evaluation on 10/26/21 showed a thickened and heterogeneous endometrial  complex measuring up to 16 mm, with scattered areas of internal vascularity. ([place] also showed two prominent nabothian cysts measuring up to 1.7 cm).   Accordingly, the patient met with her gynecologist, Dr. [name], on 10/27/21 who collected endometrial biopsies. 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 Pap smear also performed during this visit showed presence of atypical endometrial cells.   The patient was then referred to Dr. Berline Lopes on 11/07/21.  Following discussion of the risks and benefits, the patient agreed to proceed with D&C and hysteroscopy. However, given her notable history of hypertension and CHF, the patient required clearance from cardiology and pre-operative MRI. Pelvic exam performed during this visit showed no abnormalities.   Pelvic MRI on 11/14/21 again demonstrated a thickened (18 mm) endometrium, compatible with known endometrial malignancy. Evidence of full-thickness myometrial invasion by endometrial tumor throughout the left uterine body was also appreciated, extending over 180 degrees of involvement. Probable early small focus of direct tumor invasion was also seen into the parametrial soft tissues in the anterior left uterine body. No pelvic lymphadenopathy or adnexal masses were appreciated.  Given findings on MRI, Dr. Berline Lopes advised the patient to pursue XRT prior to her procedure to give her the chance for interval surgery depending on treatment response (and also a chance for glycemic control).   The patient's case was discussed at the tumor board held on 11/21/21. Disposition concluded was for CT imaging followed by pelvic radiation therapy plus brachytherapy followed by interval surgery.   Subsequent CT of the abdomen with contrast on 11/24/21 showed no evidence of abdominal metastatic disease or other acute findings. Other findings on CT included mild hepatic stenosis, and colonic diverticulosis without  evidence of  diverticulitis.   PREVIOUS RADIATION THERAPY: No  PAST MEDICAL HISTORY:  Past Medical History:  Diagnosis Date   Acute systolic heart failure (HCC)    Allergy    Asthma    Bilateral shoulder pain    Borderline hypertension    Bronchitis    CAD (coronary artery disease)    Chronic systolic CHF (congestive heart failure) (HCC)    Chronic systolic congestive heart failure, NYHA class 3 ([place]) 07/24/2018   Diabetes mellitus    Diagnosed in 2000, on insulin, Trulicity and metformin, not checking cgs at home   Gangrenous appendicitis with perforation s/p lap appendectomy 10/04/2017 10/04/2017   GERD (gastroesophageal reflux disease)    History of MI (myocardial infarction)    Hyperlipidemia    Hypertension    Hypothyroidism    MGUS (monoclonal gammopathy of unknown significance)    Mitral regurgitation    Myocardial infarction ([place])    Neuropathy    Obesity    Sleep apnea    uses CPAP   Statin intolerance    Uterine cancer ([place])     PAST SURGICAL HISTORY: Past Surgical History:  Procedure Laterality Date   BREATH TEK H PYLORI  09/18/2011   Procedure: BREATH TEK H PYLORI;  Surgeon: Shann Medal, MD;  Location: [facility];  Service: General;  Laterality: N/A;   CESAREAN SECTION     x 3   IR IMAGING GUIDED PORT INSERTION  12/07/2021   LAPAROSCOPIC APPENDECTOMY N/A 10/03/2017   Procedure: APPENDECTOMY LAPAROSCOPIC;  Surgeon: Leighton Ruff, MD;  Location: [facility];  Service: General;  Laterality: N/A;   RIGHT/LEFT HEART CATH AND CORONARY ANGIOGRAPHY N/A 02/12/2018   Procedure: RIGHT/LEFT HEART CATH AND CORONARY ANGIOGRAPHY;  Surgeon: Jettie Booze, MD;  Location: [facility];  Service: Cardiovascular;  Laterality: N/A;   TUBAL LIGATION      FAMILY HISTORY:  Family History  Problem Relation Age of Onset   Alcohol abuse Mother    Arthritis Mother    Diabetes Mother    Sleep apnea Mother    Anxiety disorder Mother    Eating disorder Mother    Obesity Mother     Cancer Father    Alcohol abuse Father    Heart disease Father    Hypertension Father    Stroke Father    Sleep apnea Sister    Breast cancer Neg Hx    Colon cancer Neg Hx    Ovarian cancer Neg Hx    Endometrial cancer Neg Hx    Pancreatic cancer Neg Hx    Prostate cancer Neg Hx     SOCIAL HISTORY:  Social History   Tobacco Use   Smoking status: Former    Packs/day: 1.00    Years: 15.00    Pack years: 15.00    Types: Cigarettes    Quit date: 01/28/1990    Years since quitting: 32.0   Smokeless tobacco: Never  Vaping Use   Vaping Use: Never used  Substance Use Topics   Alcohol use: Not Currently    Comment: occasional   Drug use: No    ALLERGIES:  Allergies  Allergen Reactions   Amoxicillin Other (See Comments)    Dizziness, abdominal pain   Adhesive [Tape] Rash    Tegaderm   Exenatide Other (See Comments)    Flu-like symptoms   Invokana [Canagliflozin] Other (See Comments)    Yeast infection/dehydration   Jardiance [Empagliflozin] Other (See Comments)    Yeast infections and dehydration  Lisinopril Other (See Comments)    cramping   Other Diarrhea and Other (See Comments)    Kale=food  Per patient, it causes GI bleeding    Statins Other (See Comments)    Leg cramps    MEDICATIONS:  Current Facility-Administered Medications  Medication Dose Route Frequency Provider Last Rate Last Admin   acetaminophen (TYLENOL) tablet 1,000 mg  1,000 mg Oral Once Suzette Battiest, MD       chlorhexidine (PERIDEX) 0.12 % solution 15 mL  15 mL Mouth/Throat Once Suzette Battiest, MD       Or   MEDLINE mouth rinse  15 mL Mouth Rinse Once Suzette Battiest, MD       lactated ringers infusion   Intravenous Continuous Suzette Battiest, MD       povidone-iodine 10 % swab 2 application.  2 application. Topical Once Gery Pray, MD        REVIEW OF SYSTEMS:  A 10+ POINT REVIEW OF SYSTEMS WAS OBTAINED including neurology, dermatology, psychiatry, cardiac, respiratory,  lymph, extremities, GI, GU, musculoskeletal, constitutional, reproductive, HEENT.  She denies any pelvic pain vaginal bleeding or discharge.   PHYSICAL EXAM:  oral temperature is 98.6 F (37 C). Her blood pressure is 137/81 and her pulse is 98. Her respiration is 16 and oxygen saturation is 97%.   General: Alert and oriented, in no acute distress HEENT: Head is normocephalic. Extraocular movements are intact.  Neck: Neck is supple, no palpable cervical or supraclavicular lymphadenopathy. Heart: Regular in rate and rhythm with no murmurs, rubs, or gallops. Chest: Clear to auscultation bilaterally, with no rhonchi, wheezes, or rales. Abdomen: Soft, nontender, nondistended, with no rigidity or guarding. Extremities: No cyanosis or edema. Lymphatics: see Neck Exam Skin: No concerning lesions. Musculoskeletal: symmetric strength and muscle tone throughout. Neurologic: Cranial nerves II through XII are grossly intact. No obvious focalities. Speech is fluent. Coordination is intact. Psychiatric: Judgment and insight are intact. Affect is appropriate. Pelvic exam to be performed in OR.   ECOG = 1   LABORATORY DATA:  Lab Results  Component Value Date   WBC 4.7 01/16/2022   HGB 11.9 (L) 01/16/2022   HCT 34.9 (L) 01/16/2022   MCV 85.5 01/16/2022   PLT 257 01/16/2022   NEUTROABS 3.3 01/16/2022   Lab Results  Component Value Date   NA 134 (L) 01/16/2022   K 4.7 01/16/2022   CL 100 01/16/2022   CO2 26 01/16/2022   GLUCOSE 191 (H) 01/16/2022   CREATININE 0.88 01/16/2022   CALCIUM 9.4 01/16/2022      RADIOGRAPHY: MR Pelvis W Wo Contrast  Result Date: 01/25/2022 CLINICAL DATA:  Endometrial carcinoma. Undergoing chemoradiation. Assess treatment response. EXAM: MRI PELVIS WITHOUT AND WITH CONTRAST TECHNIQUE: Multiplanar multisequence MR imaging of the pelvis was performed both before and after administration of intravenous contrast. CONTRAST:  67m GADAVIST GADOBUTROL 1 MMOL/ML IV SOLN  COMPARISON:  11/14/2021 FINDINGS: Lower Urinary Tract: No urinary bladder or urethral abnormality identified. Bowel: Sigmoid diverticulosis, without evidence of diverticulitis. Vascular/Lymphatic: Unremarkable. No pathologically enlarged pelvic lymph nodes identified. Reproductive: -- Uterus: Measures 8.6 x 3.7 by 4.9 cm (volume = 82 cm^3). No fibroids identified. Endometrial thickness measures 8 mm, decreased compared to 18 mm on prior exam. There is persistent evidence of deep myometrial invasion (> 50%) in the left lateral uterine corpus, however no definite extra uterine extension is demonstrated. -- Right ovary: Appears normal. No ovarian or adnexal masses identified. -- Left ovary: Appears normal. No ovarian or adnexal masses identified. Other:  No peritoneal thickening or abnormal free fluid. Musculoskeletal:  Unremarkable. IMPRESSION: Decreased endometrial thickness, consistent with interval response to therapy. Persistent deep myometrial invasion (> 50%) noted in the left lateral uterine corpus. No evidence of extra-uterine tumor extension. No evidence of pelvic metastatic disease. Sigmoid diverticulosis, without evidence of diverticulitis. Electronically Signed   By: Marlaine Hind M.D.   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  Fortunately there is no obvious metastatic spread to lymph nodes or elsewhere in the body of the body.  MRI however shows full-thickness myometrial invasion and probable early focus of direct invasion into the parametrial soft tissues along the left side.  In addition the tumor was felt to involve the upper endocervical canal.  Given these MRI findings she would not be a candidate at this time for definitive surgery.  She however would be a candidate for preoperative treatments and attempt to shrink the tumor and make her a  surgical candidate.   PLAN: .  Plan is for 1 intracavitary brachytherapy procedure as above.     ------------------------------------------------  Blair Promise, PhD, MD

## 2022-02-01 NOTE — Telephone Encounter (Signed)
    Patient Name: Terri Heath  DOB: 04/13/1956 MRN: [id number]  Primary Cardiologist: Larae Grooms, MD  Chart reviewed as part of pre-operative protocol coverage. Per Dr. Aundra Dubin, primary cardiologist, Terri Heath would be at acceptable risk for the planned procedure without further cardiovascular testing.   Additionally, per Dr. Aundra Dubin, patient may hold aspirin for 5 to 7 days prior to procedure. Please resume aspirin as soon as possible postprocedure, at the discretion of the surgeon.  I will route this recommendation to the requesting party via Epic fax function and remove from pre-op pool.  Please call with questions.  Lenna Sciara, NP 02/01/2022, 12:04 PM

## 2022-02-01 NOTE — Anesthesia Postprocedure Evaluation (Signed)
Anesthesia Post Note  Patient: Arlie Solomons  Procedure(s) Performed: TANDEM RING INSERTION OPERATIVE ULTRASOUND     Patient location during evaluation: PACU Anesthesia Type: General Level of consciousness: awake and alert Pain management: pain level controlled Vital Signs Assessment: post-procedure vital signs reviewed and stable Respiratory status: spontaneous breathing, nonlabored ventilation, respiratory function stable and patient connected to nasal cannula oxygen Cardiovascular status: blood pressure returned to baseline and stable Postop Assessment: no apparent nausea or vomiting Anesthetic complications: no   No notable events documented.  Last Vitals:  Vitals:   02/01/22 1000 02/01/22 1015  BP: 112/65 129/86  Pulse: 91 95  Resp: 14 19  Temp:  36.7 C  SpO2: 99% 94%    Last Pain:  Vitals:   02/01/22 1015  TempSrc:   PainSc: 0-No pain                 Tiajuana Amass

## 2022-02-01 NOTE — Interval H&P Note (Signed)
History and Physical Interval Note:  02/01/2022 8:21 AM  Terri Heath  has presented today for surgery, with the diagnosis of ENDOMETRIAL CANCER.  The various methods of treatment have been discussed with the patient and family. After consideration of risks, benefits and other options for treatment, the patient has consented to  Procedure(s): TANDEM RING INSERTION (N/A) OPERATIVE ULTRASOUND (N/A) as a surgical intervention.  The patient's history has been reviewed, patient examined, no change in status, stable for surgery.  I have reviewed the patient's chart and labs.  Questions were answered to the patient's satisfaction.     Gery Pray

## 2022-02-01 NOTE — Transfer of Care (Signed)
Immediate Anesthesia Transfer of Care Note  Patient: Terri Heath  Procedure(s) Performed: TANDEM RING INSERTION OPERATIVE ULTRASOUND  Patient Location: PACU  Anesthesia Type:General  Level of Consciousness: awake, alert  and oriented  Airway & Oxygen Therapy: Patient Spontanous Breathing and Patient connected to face mask oxygen  Post-op Assessment: Report given to RN and Post -op Vital signs reviewed and stable  Post vital signs: Reviewed and stable  Last Vitals:  Vitals Value Taken Time  BP 124/76 02/01/22 0955  Temp    Pulse 92 02/01/22 0957  Resp 15 02/01/22 0957  SpO2 100 % 02/01/22 0957  Vitals shown include unvalidated device data.  Last Pain:  Vitals:   02/01/22 0749  TempSrc:   PainSc: 0-No pain         Complications: No notable events documented.

## 2022-02-01 NOTE — Patient Instructions (Signed)
IMMEDIATELY FOLLOWING SURGERY: Do not drive or operate machinery for the first twenty four hours after surgery. Do not make any important decisions for twenty four hours after surgery or while taking narcotic pain medications or sedatives. If you develop intractable nausea and vomiting or a severe headache please notify your doctor immediately.   FOLLOW-UP: You do not need to follow up with anesthesia unless specifically instructed to do so.   WOUND CARE INSTRUCTIONS (if applicable): Expect some mild vaginal bleeding, but if large amount of bleeding occurs please contact Dr. Sondra Come at [phone] or the [facility] physician. Call for any fever greater than 101.0 degrees or increasing vaginal//abdominal pain or trouble urinating.   QUESTIONS?: Please feel free to call your physician or the hospital operator if you have any questions, and they will be happy to assist you. Resume all medications: as listed on your after visit summary. Your next appointment is:  Future Appointments  Date Time Provider [place]  03/06/2022  3:45 PM [name], MD [facility] None  03/06/2022  4:00 PM Cross, Carollee Massed, NP [facility] None

## 2022-02-02 ENCOUNTER — Encounter (HOSPITAL_COMMUNITY): Payer: Self-pay | Admitting: Radiation Oncology

## 2022-02-02 LAB — URINE CULTURE: Culture: NO GROWTH

## 2022-02-03 ENCOUNTER — Other Ambulatory Visit (HOSPITAL_COMMUNITY): Payer: Self-pay

## 2022-02-03 ENCOUNTER — Encounter: Payer: Self-pay | Admitting: Hematology and Oncology

## 2022-02-03 MED ORDER — SPIRONOLACTONE 25 MG PO TABS: 25.0000 mg | ORAL_TABLET | Freq: Every day | ORAL | 1 refills | Status: DC

## 2022-02-03 MED ORDER — FUROSEMIDE 20 MG PO TABS: 20.0000 mg | ORAL_TABLET | Freq: Every day | ORAL | 1 refills | Status: DC

## 2022-02-03 MED ORDER — CARVEDILOL 6.25 MG PO TABS: 6.2500 mg | ORAL_TABLET | Freq: Two times a day (BID) | ORAL | 1 refills | Status: DC

## 2022-02-09 ENCOUNTER — Telehealth (HOSPITAL_BASED_OUTPATIENT_CLINIC_OR_DEPARTMENT_OTHER): Payer: Self-pay

## 2022-02-09 DIAGNOSIS — E1159 Type 2 diabetes mellitus with other circulatory complications: Secondary | ICD-10-CM

## 2022-02-09 DIAGNOSIS — I5022 Chronic systolic (congestive) heart failure: Secondary | ICD-10-CM

## 2022-02-09 DIAGNOSIS — E039 Hypothyroidism, unspecified: Secondary | ICD-10-CM

## 2022-02-09 NOTE — Telephone Encounter (Signed)
Pharmacy tech and left voicemail requesting a refill on 4 scripts:  Levothyroxine 50 mcg Metformin 1000 mg Glucose blood test strip Glucose meter kit  Prescriptions can be sent electronically, over the phone at [phone], or faxed to [phone]

## 2022-02-10 MED ORDER — LEVOTHYROXINE SODIUM 50 MCG PO TABS
50.0000 ug | ORAL_TABLET | Freq: Every day | ORAL | 3 refills | Status: DC
  Filled 2022-05-16: qty 90, 90d supply, fill #0

## 2022-02-10 MED ORDER — GLUCOSE BLOOD VI STRP: ORAL_STRIP | 99 refills | Status: DC

## 2022-02-10 MED ORDER — METFORMIN HCL 1000 MG PO TABS
1000.0000 mg | ORAL_TABLET | Freq: Two times a day (BID) | ORAL | 3 refills | Status: DC
  Filled 2022-05-16: qty 180, 90d supply, fill #0

## 2022-02-10 MED ORDER — BLOOD GLUCOSE METER KIT: PACK | 99 refills | Status: AC

## 2022-02-10 NOTE — Telephone Encounter (Signed)
Refills have been sent to [facility] for patient.

## 2022-02-13 ENCOUNTER — Other Ambulatory Visit (HOSPITAL_COMMUNITY): Payer: Self-pay

## 2022-02-14 ENCOUNTER — Telehealth: Payer: 59 | Admitting: Gynecologic Oncology

## 2022-02-20 ENCOUNTER — Telehealth: Payer: Self-pay | Admitting: Hematology and Oncology

## 2022-02-21 ENCOUNTER — Encounter: Payer: Self-pay | Admitting: Radiation Oncology

## 2022-02-24 ENCOUNTER — Telehealth: Payer: Self-pay | Admitting: *Deleted

## 2022-02-24 ENCOUNTER — Encounter: Payer: 59 | Admitting: Gynecologic Oncology

## 2022-02-24 NOTE — Telephone Encounter (Signed)
Returned patient's phone call, lvm for a return call 

## 2022-02-27 ENCOUNTER — Ambulatory Visit: Payer: 59 | Admitting: Radiation Oncology

## 2022-03-01 ENCOUNTER — Other Ambulatory Visit (HOSPITAL_COMMUNITY): Payer: Self-pay | Admitting: Gynecologic Oncology

## 2022-03-02 ENCOUNTER — Other Ambulatory Visit (HOSPITAL_COMMUNITY): Payer: Self-pay

## 2022-03-02 ENCOUNTER — Other Ambulatory Visit (HOSPITAL_BASED_OUTPATIENT_CLINIC_OR_DEPARTMENT_OTHER): Payer: Self-pay

## 2022-03-02 ENCOUNTER — Telehealth (HOSPITAL_BASED_OUTPATIENT_CLINIC_OR_DEPARTMENT_OTHER): Payer: Self-pay | Admitting: Nurse Practitioner

## 2022-03-02 NOTE — Telephone Encounter (Signed)
Surgical optimization was faxed over to [phone] per Sarabeth. Notes were placed to be scanned in patient's chart per Laretta Bolster

## 2022-03-02 NOTE — Telephone Encounter (Signed)
Surgical optimization form completed and signed with recommendations for close hemoglobin A1c monitoring prior to surgery. Appreciate cardiology input.  Forms placed in CMA basket to be faxed and sent for scanning into chart.  Please respond when fax has been sent.

## 2022-03-03 ENCOUNTER — Telehealth (HOSPITAL_BASED_OUTPATIENT_CLINIC_OR_DEPARTMENT_OTHER): Payer: Self-pay | Admitting: Nurse Practitioner

## 2022-03-03 ENCOUNTER — Other Ambulatory Visit (HOSPITAL_BASED_OUTPATIENT_CLINIC_OR_DEPARTMENT_OTHER): Payer: Self-pay | Admitting: Nurse Practitioner

## 2022-03-03 ENCOUNTER — Other Ambulatory Visit (HOSPITAL_BASED_OUTPATIENT_CLINIC_OR_DEPARTMENT_OTHER): Payer: Self-pay

## 2022-03-03 ENCOUNTER — Other Ambulatory Visit (HOSPITAL_COMMUNITY): Payer: Self-pay

## 2022-03-03 ENCOUNTER — Encounter: Payer: Self-pay | Admitting: Hematology and Oncology

## 2022-03-03 DIAGNOSIS — E1159 Type 2 diabetes mellitus with other circulatory complications: Secondary | ICD-10-CM

## 2022-03-03 DIAGNOSIS — E1165 Type 2 diabetes mellitus with hyperglycemia: Secondary | ICD-10-CM

## 2022-03-03 MED ORDER — INSULIN GLARGINE 100 UNIT/ML SOLOSTAR PEN
25.0000 [IU] | PEN_INJECTOR | Freq: Every day | SUBCUTANEOUS | 3 refills | Status: DC
  Filled 2022-03-03: qty 45, 112d supply, fill #0
  Filled 2022-03-03: qty 12, 30d supply, fill #0
  Filled 2022-03-03: qty 45, 100d supply, fill #0

## 2022-03-03 NOTE — Telephone Encounter (Signed)
Pt called needing refills on medication(s)  to switch my insulin to Lantus which [employer] will pay for. They need a new 3 month perscription sent to them today so I do not go without insulin.   Sent to this pharmacy: To get it sooner send to: [facility] [place], [place], [place] [address]  Send future refills to [employer] [facility].   Pt has this many left:  1 pen left Will last 4 days  Please advise.

## 2022-03-03 NOTE — Progress Notes (Signed)
Insurance no longer covering [employer]- transition to Lantus for insurance coverage.  New prescription sent to pharmacy. No change in dosing due to biosimilar formulation.

## 2022-03-03 NOTE — Telephone Encounter (Signed)
Sarabeth called in lantus insulin for patient and sent to [place]

## 2022-03-05 NOTE — Progress Notes (Signed)
Radiation Oncology         [phone] ________________________________  Name: Terri Heath MRN: [id number]  Date: 03/06/2022  DOB: [birth year]  Follow-Up Visit Note  CC: Early, Coralee Pesa, NP  Early, Coralee Pesa, NP  No diagnosis found.  Diagnosis: The encounter diagnosis was Endometrial cancer ([place]).   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 The patient tolerated radiation therapy relatively well. On the date of her final IMRT treatment, the patient reported discomfort in the suprapubic area (improved), fatigue, increased dysuria, and vaginal bleeding for several days which resolved on its own. She denied any further vaginal bleeding.  She did receive her last round of chemotherapy the week prior to her final treatment and attributed a lot of her symptoms to this. The patient proceeded to undergo 1 brachytherapy procedure prior to her anticipated surgery (per MRI results detailed below). She tolerated brachytherapy well without any significant side effects.   The patient completed concurrent chemotherapy consisting of sensitizing cisplatin on 01/10/22 under the care of Dr. Alvy Bimler. Per her most recent follow up with Dr. Alvy Bimler on 01/17/22, the patient was noted to have very mild persistent anemia, hypomagnesemia, and hypocalcemia (all likely secondary to systemic treatment).   MRI of the pelvis on 01/24/22 showed a decrease in endometrial thickness, consistent with an interval response to therapy. MRI also showed persistent deep  myometrial invasion (> 50%) in the left lateral uterine corpus. No evidence of extra-uterine tumor extension or pelvic metastatic disease were appreciated.   The patient most recently followed up with Dr. Berline Lopes on 01/27/22. During this visit, the patient endorsed considerable fatigue with treatment, and an episode of considerable cramping and discharge occurring from the 20th - 21st of May. She denied any cramping, discharge or bleeding since then. She also reported resolution of dysuria, and some looser stools with discomfort since radiation. Recent MRI was reviewed with the patient during this visit, and given the good treatment response apparent on imaging, Dr. Berline Lopes advised proceeding with definitive surgery with total hysterectomy and BSO.   The patient has been scheduled for hysterectomy with BSO on 03/21/22 with Dr. Berline Lopes.   ***                               Allergies:  is allergic to amoxicillin, adhesive [tape], exenatide, invokana [canagliflozin], jardiance [empagliflozin], lisinopril, other, and statins.  Meds: Current Outpatient Medications  Medication Sig Dispense Refill   acetaminophen (TYLENOL) 500 MG tablet Take 1,000 mg by mouth every 6 (six) hours as needed for moderate pain.     albuterol (PROAIR HFA) 108 (90 Base) MCG/ACT inhaler Inhale 2 puffs into the lungs every 6 (six) hours as needed. wheezing 18 g 2   aspirin 81 MG chewable tablet Chew 1 tablet (81 mg total) by mouth daily. 90 tablet 3   blood glucose meter kit and supplies Use up to four times daily as directed. (FOR ICD-10 E10.9, E11.9). 1 each 99   carvedilol (COREG) 6.25 MG tablet Take  1 tablet (6.25 mg total) by mouth 2 (two) times daily with a meal. 60 tablet 1   diphenhydrAMINE (BENADRYL) 25 MG tablet Take 25 mg by mouth at bedtime.     diphenoxylate-atropine (LOMOTIL) 2.5-0.025 MG tablet Take 1 tablet by mouth 4 (four) times daily as needed for diarrhea or loose stools. 30 tablet 0   Dulaglutide (TRULICITY) 3  IR/6.7EL SOPN Inject 3 mg into the skin once a week. 6 mL 3   Evolocumab (REPATHA SURECLICK) 381 MG/ML SOAJ Inject 1 pen into the skin every 14 (fourteen) days. 2 mL 11   furosemide (LASIX) 20 MG tablet Take 1 tablet (20 mg total) by mouth daily. 30 tablet 1   glucose blood test strip Use as directed to test blood sugar 100 each PRN   insulin glargine (LANTUS) 100 UNIT/ML Solostar Pen Inject 25-40 Units into the skin at bedtime based on blood sugar readings and adjust via sliding scale. 45 mL 3   Insulin Pen Needle (UNIFINE PENTIPS) 32G X 4 MM MISC USE AS DIRECTED ONCE A DAY 100 each 3   levothyroxine (SYNTHROID) 50 MCG tablet Take 1 tablet (50 mcg total) by mouth daily before breakfast. 90 tablet 3   lidocaine-prilocaine (EMLA) cream Apply to affected area once (Patient taking differently: 1 application  daily as needed (to draw labs of infusion).) 30 g 3   loperamide (IMODIUM) 2 MG capsule Take 2 mg by mouth as needed for diarrhea or loose stools.     Magnesium 250 MG TABS Take 250 mg by mouth daily.     magnesium oxide (MAG-OX) 400 (240 Mg) MG tablet Take 1 tablet (400 mg total) by mouth daily. (Patient not taking: Reported on 03/03/2022) 30 tablet 0   metFORMIN (GLUCOPHAGE) 1000 MG tablet Take 1 tablet (1,000 mg total) by mouth 2 (two) times daily with a meal. 180 tablet 3   Multiple Vitamin (MULITIVITAMIN WITH MINERALS) TABS Take 1 tablet by mouth daily with breakfast.     NON FORMULARY Pt uses a cpap nightly     ondansetron (ZOFRAN) 8 MG tablet Take 1 tablet (8 mg total) by mouth 2 (two) times daily as needed. Start on the third day after chemotherapy. 30 tablet 1   OneTouch Delica Lancets 01B MISC use as directed 100 each 1   prochlorperazine (COMPAZINE) 10 MG tablet Take 1 tablet (10 mg total) by mouth every 6 (six) hours as needed (Nausea or vomiting). 30 tablet 1   sacubitril-valsartan (ENTRESTO) 49-51 MG Take 1 tablet by mouth 2 (two) times daily. 60 tablet 4   spironolactone (ALDACTONE)  25 MG tablet Take 1 tablet (25 mg total) by mouth daily. 60 tablet 1   triamcinolone ointment (KENALOG) 0.1 % Apply 1 application topically two times daily to affected area(s) as needed, sparing use to avoid whitening/thinning skin 30 g 1   No current facility-administered medications for this encounter.    Physical Findings: The patient is in no acute distress. Patient is alert and oriented.  vitals were not taken for this visit. .  No significant changes. Lungs are clear to auscultation bilaterally. Heart has regular rate and rhythm. No palpable cervical, supraclavicular, or axillary adenopathy. Abdomen soft, non-tender, normal bowel sounds. ***   Lab Findings: Lab Results  Component Value Date   WBC 4.7 01/16/2022   HGB 11.9 (L) 01/16/2022   HCT 34.9 (L) 01/16/2022   MCV 85.5 01/16/2022   PLT 257 01/16/2022    Radiographic Findings: No results found.  Impression:  The encounter diagnosis was Endometrial cancer ([place]).   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  ***  Plan:  ***   *** minutes of total time was spent for this patient encounter, including preparation, face-to-face counseling with the patient and coordination of care, physical exam, and documentation of the encounter. ____________________________________  Blair Promise, PhD, MD  This document serves as a record of services personally performed by Gery Pray, MD. It was created on his behalf by Roney Mans, a trained medical scribe. The creation of this record is based on the scribe's personal observations and the provider's statements to them. This document has been checked and approved by the attending provider.

## 2022-03-05 NOTE — Progress Notes (Incomplete)
  Radiation Oncology         (336) [phone] ________________________________  Patient Name: Terri Heath MRN: [id number] DOB: [birth year] Referring Physician: Jeral Pinch Date of Service: 02/01/2022 [facility], [place]                                                        End Of Treatment Note  Diagnoses: C54.1-Malignant neoplasm of endometrium  Cancer Staging: The encounter diagnosis was Endometrial cancer ([place]).   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 On the date of her final IMRT treatment, the patient reported discomfort in the suprapubic area (improved), fatigue, increased dysuria, vaginal bleeding for several days which resolved on its own. She denied any further vaginal bleeding.  She did receive her last round of chemotherapy the week prior to her final treatment and attributed a lot of her symptoms to this. The patient proceeded to undergo 1 brachytherapy procedure prior to her anticipated surgery. She tolerated brachytherapy well.   Plan: The patient will follow-up with radiation oncology in one month .  ________________________________________________ -----------------------------------  Blair Promise, PhD, MD  This document serves as a record of services personally performed by Gery Pray, MD. It was created on his behalf by Roney Mans, a trained medical scribe. The creation of this record is based on the scribe's personal observations and the provider's statements to them. This document has been checked and approved by the attending  provider.

## 2022-03-06 ENCOUNTER — Inpatient Hospital Stay: Payer: 59 | Admitting: Gynecologic Oncology

## 2022-03-06 ENCOUNTER — Inpatient Hospital Stay (HOSPITAL_BASED_OUTPATIENT_CLINIC_OR_DEPARTMENT_OTHER): Payer: 59 | Admitting: Gynecologic Oncology

## 2022-03-06 ENCOUNTER — Ambulatory Visit
Admission: RE | Admit: 2022-03-06 | Discharge: 2022-03-06 | Disposition: A | Discharge status: Home / Self Care | Payer: Medicare HMO | Source: Ambulatory Visit | Attending: Radiation Oncology | Admitting: Radiation Oncology

## 2022-03-06 ENCOUNTER — Encounter: Payer: Self-pay | Admitting: Gynecologic Oncology

## 2022-03-06 ENCOUNTER — Telehealth (HOSPITAL_BASED_OUTPATIENT_CLINIC_OR_DEPARTMENT_OTHER): Payer: Self-pay

## 2022-03-06 ENCOUNTER — Other Ambulatory Visit: Payer: Self-pay

## 2022-03-06 ENCOUNTER — Inpatient Hospital Stay: Payer: 59 | Attending: Gynecologic Oncology | Admitting: Gynecologic Oncology

## 2022-03-06 ENCOUNTER — Inpatient Hospital Stay: Payer: 59

## 2022-03-06 ENCOUNTER — Other Ambulatory Visit (HOSPITAL_COMMUNITY): Payer: Self-pay

## 2022-03-06 VITALS — BP 107/67 | HR 93 | Temp 98.1°F | Resp 16 | Ht 67.0 in | Wt 294.8 lb

## 2022-03-06 DIAGNOSIS — Z7985 Long-term (current) use of injectable non-insulin antidiabetic drugs: Secondary | ICD-10-CM | POA: Insufficient documentation

## 2022-03-06 DIAGNOSIS — D649 Anemia, unspecified: Secondary | ICD-10-CM | POA: Insufficient documentation

## 2022-03-06 DIAGNOSIS — Z79890 Hormone replacement therapy (postmenopausal): Secondary | ICD-10-CM | POA: Insufficient documentation

## 2022-03-06 DIAGNOSIS — Z7984 Long term (current) use of oral hypoglycemic drugs: Secondary | ICD-10-CM | POA: Diagnosis not present

## 2022-03-06 DIAGNOSIS — C541 Malignant neoplasm of endometrium: Secondary | ICD-10-CM

## 2022-03-06 DIAGNOSIS — G4733 Obstructive sleep apnea (adult) (pediatric): Secondary | ICD-10-CM

## 2022-03-06 DIAGNOSIS — E119 Type 2 diabetes mellitus without complications: Secondary | ICD-10-CM | POA: Diagnosis not present

## 2022-03-06 DIAGNOSIS — Z7982 Long term (current) use of aspirin: Secondary | ICD-10-CM | POA: Insufficient documentation

## 2022-03-06 DIAGNOSIS — Z923 Personal history of irradiation: Secondary | ICD-10-CM | POA: Diagnosis not present

## 2022-03-06 DIAGNOSIS — Z9221 Personal history of antineoplastic chemotherapy: Secondary | ICD-10-CM | POA: Diagnosis not present

## 2022-03-06 DIAGNOSIS — Z79899 Other long term (current) drug therapy: Secondary | ICD-10-CM | POA: Diagnosis not present

## 2022-03-06 DIAGNOSIS — E1165 Type 2 diabetes mellitus with hyperglycemia: Secondary | ICD-10-CM

## 2022-03-06 LAB — CBC WITH DIFFERENTIAL (CANCER CENTER ONLY)
Abs Immature Granulocytes: 0.03 10*3/uL (ref 0.00–0.07)
Basophils Absolute: 0 10*3/uL (ref 0.0–0.1)
Basophils Relative: 1 %
Eosinophils Absolute: 0.1 10*3/uL (ref 0.0–0.5)
Eosinophils Relative: 2 %
HCT: 34.3 % — ABNORMAL LOW (ref 36.0–46.0)
Hemoglobin: 11.7 g/dL — ABNORMAL LOW (ref 12.0–15.0)
Immature Granulocytes: 1 %
Lymphocytes Relative: 23 %
Lymphs Abs: 1.2 10*3/uL (ref 0.7–4.0)
MCH: 30 pg (ref 26.0–34.0)
MCHC: 34.1 g/dL (ref 30.0–36.0)
MCV: 87.9 fL (ref 80.0–100.0)
Monocytes Absolute: 0.5 10*3/uL (ref 0.1–1.0)
Monocytes Relative: 10 %
Neutro Abs: 3.2 10*3/uL (ref 1.7–7.7)
Neutrophils Relative %: 63 %
Platelet Count: 268 10*3/uL (ref 150–400)
RBC: 3.9 MIL/uL (ref 3.87–5.11)
RDW: 15 % (ref 11.5–15.5)
WBC Count: 5 10*3/uL (ref 4.0–10.5)
nRBC: 0 % (ref 0.0–0.2)

## 2022-03-06 LAB — BASIC METABOLIC PANEL - CANCER CENTER ONLY
Anion gap: 6 (ref 5–15)
BUN: 20 mg/dL (ref 8–23)
CO2: 27 mmol/L (ref 22–32)
Calcium: 9 mg/dL (ref 8.9–10.3)
Chloride: 102 mmol/L (ref 98–111)
Creatinine: 0.84 mg/dL (ref 0.44–1.00)
GFR, Estimated: >60 mL/min (ref >60)
Glucose, Bld: 224 mg/dL — ABNORMAL HIGH (ref 70–99)
Potassium: 4.5 mmol/L (ref 3.5–5.1)
Sodium: 135 mmol/L (ref 135–145)

## 2022-03-06 LAB — MAGNESIUM: Magnesium: 1.6 mg/dL — ABNORMAL LOW (ref 1.7–2.4)

## 2022-03-06 MED ORDER — TRAMADOL HCL 50 MG PO TABS
50.0000 mg | ORAL_TABLET | Freq: Four times a day (QID) | ORAL | 0 refills | Status: DC | PRN
  Filled 2022-03-06: qty 10, 3d supply, fill #0

## 2022-03-06 MED ORDER — SENNOSIDES-DOCUSATE SODIUM 8.6-50 MG PO TABS
2.0000 | ORAL_TABLET | Freq: Every day | ORAL | 0 refills | Status: DC
  Filled 2022-03-06 – 2022-06-25 (×2): qty 30, 15d supply, fill #0

## 2022-03-06 MED ORDER — HEPARIN SOD (PORK) LOCK FLUSH 100 UNIT/ML IV SOLN
500.0000 [IU] | Freq: Once | INTRAVENOUS | Status: AC
  Administered 2022-03-06: 500 [IU]

## 2022-03-06 MED ORDER — SODIUM CHLORIDE 0.9% FLUSH
10.0000 mL | Freq: Once | INTRAVENOUS | Status: AC
  Administered 2022-03-06: 10 mL

## 2022-03-06 NOTE — H&P (View-Only) (Signed)
Gynecologic Oncology Return Clinic Visit  03/06/2022  Reason for Visit: Surgical planning  Treatment History: Oncology History  Endometrial cancer ([place])  10/27/2021 Pathology Results   FINAL MICROSCOPIC DIAGNOSIS:   A. ENDOMETRIUM, BIOPSY:  - Endometrioid adenocarcinoma with focal secretory features.  - See comment.   COMMENT:  The carcinoma has endometrioid features with a few small foci with secretory features.  There are several foci of solid pattern and the findings are consistent with FIGO grade 1/2.  Immunohistochemistry shows diffuse strong positivity estrogen receptor and Napsin A is negative.  P53 shows wild-type pattern.    11/07/2021 Initial Diagnosis   Endometrial cancer ([place])   11/14/2021 Imaging   MRI pelvis 1. Thickened (18 mm) endometrium, compatible with known endometrial malignancy. Evidence of full-thickness myometrial invasion by endometrial tumor throughout the left uterine body extending over 180 degrees of involvement. Probable early small focus of direct tumor invasion into the parametrial soft tissues in the anterior left uterine body as detailed. 2. Suspect a combination of direct endometrial tumor involvement of the upper endocervical canal and blood products within the endocervix. 3. No pelvic lymphadenopathy.  No adnexal masses.       11/24/2021 Imaging   CT abdomen and pelvis No evidence of abdominal metastatic disease or other acute findings.   Mild hepatic steatosis.   Colonic diverticulosis, without radiographic evidence of diverticulitis.   Aortic Atherosclerosis (ICD10-I70.0).     12/02/2021 Cancer Staging   Staging form: Corpus Uteri - Carcinoma and Carcinosarcoma, AJCC 8th Edition - Clinical stage from 12/02/2021: FIGO Stage IIIB (cT3b, cN0, cM0) - Signed by Heath Lark, MD on 12/02/2021 Stage prefix: Initial diagnosis   12/07/2021 Procedure   Status post placement of right IJ port catheter   12/13/2021 - 01/10/2022 Chemotherapy   Patient is on  Treatment Plan : Uterine Cisplatin days 1 and 29 + XRT      01/25/2022 Imaging   MRI pelvis Decreased endometrial thickness, consistent with interval response to therapy. Persistent deep myometrial invasion (> 50%) noted in the left lateral uterine corpus. No evidence of extra-uterine tumor extension.    No evidence of pelvic metastatic disease.   Sigmoid diverticulosis, without evidence of diverticulitis.     Interval History: Patient reports overall doing well.  Denies any pelvic or abdominal pain.  Denies any vaginal bleeding or discharge.  Weight remains stable, between 290 and 294 on her home scale.  CBGs have mostly been in the 160s.  Ate rice late last night and blood glucose was 197 this morning.  Past Medical/Surgical History: Past Medical History:  Diagnosis Date   Acute systolic heart failure (HCC)    Allergy    Asthma    Bilateral shoulder pain    Borderline hypertension    Bronchitis    CAD (coronary artery disease)    Chronic systolic CHF (congestive heart failure) (HCC)    Chronic systolic congestive heart failure, NYHA class 3 ([place]) 07/24/2018   Diabetes mellitus    Diagnosed in 2000, on insulin, Trulicity and metformin, not checking cgs at home   Gangrenous appendicitis with perforation s/p lap appendectomy 10/04/2017 10/04/2017   GERD (gastroesophageal reflux disease)    History of MI (myocardial infarction)    History of radiation therapy    Uterus- 12/14/21-02/01/22- Dr. Gery Pray   Hyperlipidemia    Hypertension    Hypothyroidism    MGUS (monoclonal gammopathy of unknown significance)    Mitral regurgitation    Myocardial infarction [facility])    Neuropathy  Obesity    Sleep apnea    uses CPAP   Statin intolerance    Uterine cancer [facility])     Past Surgical History:  Procedure Laterality Date   BREATH TEK H PYLORI  09/18/2011   Procedure: BREATH TEK H PYLORI;  Surgeon: Shann Medal, MD;  Location: [facility];  Service: General;  Laterality: N/A;    CESAREAN SECTION     x 3   IR IMAGING GUIDED PORT INSERTION  12/07/2021   LAPAROSCOPIC APPENDECTOMY N/A 10/03/2017   Procedure: APPENDECTOMY LAPAROSCOPIC;  Surgeon: Leighton Ruff, MD;  Location: [facility];  Service: General;  Laterality: N/A;   OPERATIVE ULTRASOUND N/A 02/01/2022   Procedure: OPERATIVE ULTRASOUND;  Surgeon: Gery Pray, MD;  Location: [facility];  Service: Urology;  Laterality: N/A;   RIGHT/LEFT HEART CATH AND CORONARY ANGIOGRAPHY N/A 02/12/2018   Procedure: RIGHT/LEFT HEART CATH AND CORONARY ANGIOGRAPHY;  Surgeon: Jettie Booze, MD;  Location: [place] [facility];  Service: Cardiovascular;  Laterality: N/A;   TANDEM RING INSERTION N/A 02/01/2022   Procedure: TANDEM RING INSERTION;  Surgeon: Gery Pray, MD;  Location: [facility];  Service: Urology;  Laterality: N/A;   TUBAL LIGATION      Family History  Problem Relation Age of Onset   Alcohol abuse Mother    Arthritis Mother    Diabetes Mother    Sleep apnea Mother    Anxiety disorder Mother    Eating disorder Mother    Obesity Mother    Cancer Father    Alcohol abuse Father    Heart disease Father    Hypertension Father    Stroke Father    Sleep apnea Sister    Breast cancer Neg Hx    Colon cancer Neg Hx    Ovarian cancer Neg Hx    Endometrial cancer Neg Hx    Pancreatic cancer Neg Hx    Prostate cancer Neg Hx     Social History   Socioeconomic History   Marital status: Married    Spouse name: Not on file   Number of children: Not on file   Years of education: Not on file   Highest education level: Not on file  Occupational History   Occupation: retired Brewing technologist, worked at [employer]  Tobacco Use   Smoking status: Former    Packs/day: 1.00    Years: 15.00    Total pack years: 15.00    Types: Cigarettes    Quit date: 01/28/1990    Years since quitting: 32.1   Smokeless tobacco: Never  Vaping Use   Vaping Use: Never used  Substance and Sexual Activity   Alcohol use: Not Currently    Comment: occasional    Drug use: No   Sexual activity: Not Currently    Birth control/protection: Post-menopausal, Abstinence  Other Topics Concern   Not on file  Social History Narrative   Lives in [place]   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   Social Determinants of Health   Financial Resource Strain: Not on file  Food Insecurity: Not on file  Transportation Needs: Not on file  Physical Activity: Inactive (01/07/2021)   Exercise Vital Sign    Days of Exercise per Week: 0 days    Minutes of Exercise per Session: 0 min  Stress: Not on file  Social Connections: Not on file    Current Medications:  Current Outpatient Medications:    acetaminophen (TYLENOL) 500 MG tablet, Take 1,000 mg by mouth  every 6 (six) hours as needed for moderate pain., Disp: , Rfl:    albuterol (PROAIR HFA) 108 (90 Base) MCG/ACT inhaler, Inhale 2 puffs into the lungs every 6 (six) hours as needed. wheezing, Disp: 18 g, Rfl: 2   aspirin 81 MG chewable tablet, Chew 1 tablet (81 mg total) by mouth daily., Disp: 90 tablet, Rfl: 3   blood glucose meter kit and supplies, Use up to four times daily as directed. (FOR ICD-10 E10.9, E11.9)., Disp: 1 each, Rfl: 99   carvedilol (COREG) 6.25 MG tablet, Take 1 tablet (6.25 mg total) by mouth 2 (two) times daily with a meal., Disp: 60 tablet, Rfl: 1   diphenhydrAMINE (BENADRYL) 25 MG tablet, Take 25 mg by mouth at bedtime., Disp: , Rfl:    diphenoxylate-atropine (LOMOTIL) 2.5-0.025 MG tablet, Take 1 tablet by mouth 4 (four) times daily as needed for diarrhea or loose stools. (Patient not taking: Reported on 03/06/2022), Disp: 30 tablet, Rfl: 0   Dulaglutide (TRULICITY) 3 ZO/1.0RU SOPN, Inject 3 mg into the skin once a week., Disp: 6 mL, Rfl: 3   Evolocumab (REPATHA SURECLICK) 045 MG/ML SOAJ, Inject 1 pen into the skin every 14 (fourteen) days., Disp: 2 mL, Rfl: 11   furosemide (LASIX) 20 MG tablet, Take 1 tablet (20 mg total) by mouth daily., Disp: 30 tablet, Rfl: 1   glucose  blood test strip, Use as directed to test blood sugar, Disp: 100 each, Rfl: PRN   insulin glargine (LANTUS) 100 UNIT/ML Solostar Pen, Inject 25-40 Units into the skin at bedtime based on blood sugar readings and adjust via sliding scale., Disp: 45 mL, Rfl: 3   Insulin Pen Needle (UNIFINE PENTIPS) 32G X 4 MM MISC, USE AS DIRECTED ONCE A DAY, Disp: 100 each, Rfl: 3   levothyroxine (SYNTHROID) 50 MCG tablet, Take 1 tablet (50 mcg total) by mouth daily before breakfast., Disp: 90 tablet, Rfl: 3   lidocaine-prilocaine (EMLA) cream, Apply to affected area once (Patient taking differently: 1 application  daily as needed (to draw labs of infusion).), Disp: 30 g, Rfl: 3   loperamide (IMODIUM) 2 MG capsule, Take 2 mg by mouth as needed for diarrhea or loose stools., Disp: , Rfl:    Magnesium 250 MG TABS, Take 250 mg by mouth daily., Disp: , Rfl:    magnesium oxide (MAG-OX) 400 (240 Mg) MG tablet, Take 1 tablet (400 mg total) by mouth daily. (Patient not taking: Reported on 03/03/2022), Disp: 30 tablet, Rfl: 0   metFORMIN (GLUCOPHAGE) 1000 MG tablet, Take 1 tablet (1,000 mg total) by mouth 2 (two) times daily with a meal., Disp: 180 tablet, Rfl: 3   Multiple Vitamin (MULITIVITAMIN WITH MINERALS) TABS, Take 1 tablet by mouth daily with breakfast., Disp: , Rfl:    NON FORMULARY, Pt uses a cpap nightly, Disp: , Rfl:    ondansetron (ZOFRAN) 8 MG tablet, Take 1 tablet (8 mg total) by mouth 2 (two) times daily as needed. Start on the third day after chemotherapy., Disp: 30 tablet, Rfl: 1   OneTouch Delica Lancets 40J MISC, use as directed, Disp: 100 each, Rfl: 1   prochlorperazine (COMPAZINE) 10 MG tablet, Take 1 tablet (10 mg total) by mouth every 6 (six) hours as needed (Nausea or vomiting)., Disp: 30 tablet, Rfl: 1   sacubitril-valsartan (ENTRESTO) 49-51 MG, Take 1 tablet by mouth 2 (two) times daily., Disp: 60 tablet, Rfl: 4   senna-docusate (SENOKOT-S) 8.6-50 MG tablet, Take 2 tablets by mouth at bedtime. For AFTER  surgery, do not take if having diarrhea, Disp: 30 tablet, Rfl: 0   spironolactone (ALDACTONE) 25 MG tablet, Take 1 tablet (25 mg total) by mouth daily., Disp: 60 tablet, Rfl: 1   traMADol (ULTRAM) 50 MG tablet, Take 1 tablet (50 mg total) by mouth every 6 (six) hours as needed for severe pain. For AFTER surgery only, do not take and drive, Disp: 10 tablet, Rfl: 0   triamcinolone ointment (KENALOG) 0.1 %, Apply 1 application topically two times daily to affected area(s) as needed, sparing use to avoid whitening/thinning skin, Disp: 30 g, Rfl: 1  Review of Systems: Denies appetite changes, fevers, chills, fatigue, unexplained weight changes. Denies hearing loss, neck lumps or masses, mouth sores, ringing in ears or voice changes. Denies cough or wheezing.  Denies shortness of breath. Denies chest pain or palpitations. Denies leg swelling. Denies abdominal distention, pain, blood in stools, constipation, diarrhea, nausea, vomiting, or early satiety. Denies pain with intercourse, dysuria, frequency, hematuria or incontinence. Denies hot flashes, pelvic pain, vaginal bleeding or vaginal discharge.   Denies joint pain, back pain or muscle pain/cramps. Denies itching, rash, or wounds. Denies dizziness, headaches, numbness or seizures. Denies swollen lymph nodes or glands, denies easy bruising or bleeding. Denies anxiety, depression, confusion, or decreased concentration.  Physical Exam: Vital signs: Blood pressure 107/67, pulse 93 respiratory rate 16, temperature 36.7 C, oxygen saturation 100% on room air General: Alert, oriented, no acute distress. HEENT: Normocephalic, atraumatic, sclera anicteric. Chest: Unlabored breathing on room air.  Laboratory & Radiologic Studies: Hgb A1c on 01/27/22: 8.6%  Assessment & Plan: Terri Heath is a 66 y.o. 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  Patient is overall doing well.  Reviewed findings on exam at the time of surgery with no parametrial involvement.  Recent MRI shows no extrauterine disease.  Patient has had significant improvement in her diabetes with glycemic control almost in range.  She also was able to achieve some weight loss.  I think it is reasonable to move forward with surgery as we have discussed.  We discussed the plan for for a robotic assisted hysterectomy, bilateral salpingo-oophorectomy, possible laparotomy. The risks of surgery were discussed in detail and she understands these to include infection; wound separation; hernia; vaginal cuff separation, injury to adjacent organs such as bowel, bladder, blood vessels, ureters and nerves; bleeding which may require blood transfusion; anesthesia risk; thromboembolic events; possible death; unforeseen complications; possible need for re-exploration; medical complications such as heart attack, stroke, pleural effusion and pneumonia. The patient will receive DVT and antibiotic prophylaxis as indicated. She voiced a clear understanding. She had the opportunity to ask questions. Perioperative instructions were reviewed with her. Prescriptions for post-op medications were sent to her pharmacy of choice.  In the setting of recent radiation, we also discussed increased morbidity related to surgery and difficulty healing postoperatively.  Stressed the importance of continued good glycemic control to help with postoperative healing and decrease the risk of wound infection.  28 minutes of total time was spent for this patient encounter, including preparation, face-to-face counseling with the patient and coordination of care, and documentation of the encounter.  Jeral Pinch, MD  Division of Gynecologic Oncology  Department of Obstetrics and Gynecology  [facility]

## 2022-03-06 NOTE — Telephone Encounter (Signed)
Danielle from [facility] called and left a msg on nurse line requesting refill on Lantus  Recent refill sent to [facility]

## 2022-03-06 NOTE — Progress Notes (Signed)
Terri Heath is here today for follow up post radiation to the pelvic.  They completed their radiation on: 02/04/2022   Does the patient complain of any of the following:  Pain:no Abdominal bloating: no Diarrhea/Constipation: no Nausea/Vomiting: no Vaginal Discharge: no Blood in Urine or Stool: no Urinary Issues (dysuria/incomplete emptying/ incontinence/ increased frequency/urgency): no Post radiation skin changes: no   Additional comments if applicable: Surgery is planned for 03/21/22.  BP 109/74 (BP Location: Left Arm, Patient Position: Sitting)   Pulse 96   Temp 97.8 F (36.6 C) (Oral)   Resp 18   Ht '5\' 7"'$  (1.702 m)   Wt 291 lb 6 oz (132.2 kg)   SpO2 99%   BMI 45.64 kg/m

## 2022-03-06 NOTE — Patient Instructions (Signed)
Preparing for your Surgery  Plan for surgery on March 21, 2022 with Dr. Jeral Pinch at [place] will be scheduled for robotic assisted total laparoscopic hysterectomy (removal of the uterus and cervix), bilateral salpingo-oophorectomy (removal of both ovaries and fallopian tubes), and any other indicated procedures.   Please bring your CPAP machine and mask to surgery.  Pre-operative Testing -You will receive a phone call from presurgical testing at [facility] to arrange for a pre-operative appointment and lab work.  -Bring your insurance card, copy of an advanced directive if applicable, medication list  -At that visit, you will be asked to sign a consent for a possible blood transfusion in case a transfusion becomes necessary during surgery.  The need for a blood transfusion is rare but having consent is a necessary part of your care.     -You should not be taking blood thinners or aspirin at least ten days prior to surgery unless instructed by your surgeon.  -Do not take supplements such as fish oil (omega 3), red yeast rice, turmeric before your surgery. You want to avoid medications with aspirin in them including headache powders such as BC or Goody's), Excedrin migraine.  Day Before Surgery at [place] will be asked to take in a light diet the day before surgery. You will be advised you can have clear liquids up until 3 hours before your surgery.    Eat a light diet the day before surgery.  Examples including soups, broths, toast, yogurt, mashed potatoes.  AVOID GAS PRODUCING FOODS. Things to avoid include carbonated beverages (fizzy beverages, sodas), raw fruits and raw vegetables (uncooked), or beans.   If your bowels are filled with gas, your surgeon will have difficulty visualizing your pelvic organs which increases your surgical risks.  Your role in recovery Your role is to become active as soon as directed by your doctor, while still giving  yourself time to heal.  Rest when you feel tired. You will be asked to do the following in order to speed your recovery:  - Cough and breathe deeply. This helps to clear and expand your lungs and can prevent pneumonia after surgery.  - [place]. Do mild physical activity. Walking or moving your legs help your circulation and body functions return to normal. Do not try to get up or walk alone the first time after surgery.   -If you develop swelling on one leg or the other, pain in the back of your leg, redness/warmth in one of your legs, please call the office or go to the Emergency Room to have a doppler to rule out a blood clot. For shortness of breath, chest pain-seek care in the Emergency Room as soon as possible. - Actively manage your pain. Managing your pain lets you move in comfort. We will ask you to rate your pain on a scale of zero to 10. It is your responsibility to tell your doctor or nurse where and how much you hurt so your pain can be treated.  Special Considerations -If you are diabetic, you may be placed on insulin after surgery to have closer control over your blood sugars to promote healing and recovery.  This does not mean that you will be discharged on insulin.  If applicable, your oral antidiabetics will be resumed when you are tolerating a solid diet.  -Your final pathology results from surgery should be available around one week after surgery and the results will be relayed  to you when available.  -Dr. Lahoma Crocker is the surgeon that assists your GYN Oncologist with surgery.  If you end up staying the night, the next day after your surgery you will either see Dr. Berline Lopes or Dr. Lahoma Crocker.  -FMLA forms can be faxed to [phone] and please allow 5-7 business days for completion.  Pain Management After Surgery -You have been prescribed your pain medication and bowel regimen medications before surgery so that you can have these available  when you are discharged from the hospital. The pain medication is for use ONLY AFTER surgery and a new prescription will not be given.   -Make sure that you have Tylenol and Ibuprofen IF YOU ARE ABLE TO TAKE THESE MEDICATIONS at home to use on a regular basis after surgery for pain control. We recommend alternating the medications every hour to six hours since they work differently and are processed in the body differently for pain relief.  -Review the attached handout on narcotic use and their risks and side effects.   Bowel Regimen -You have been prescribed Sennakot-S to take nightly to prevent constipation especially if you are taking the narcotic pain medication intermittently.  It is important to prevent constipation and drink adequate amounts of liquids. You can stop taking this medication when you are not taking pain medication and you are back on your normal bowel routine.  Risks of Surgery Risks of surgery are low but include bleeding, infection, damage to surrounding structures, re-operation, blood clots, and very rarely death.   Blood Transfusion Information (For the consent to be signed before surgery)  We will be checking your blood type before surgery so in case of emergencies, we will know what type of blood you would need.                                            WHAT IS A BLOOD TRANSFUSION?  A transfusion is the replacement of blood or some of its parts. Blood is made up of multiple cells which provide different functions. Red blood cells carry oxygen and are used for blood loss replacement. White blood cells fight against infection. Platelets control bleeding. Plasma helps clot blood. Other blood products are available for specialized needs, such as hemophilia or other clotting disorders. BEFORE THE TRANSFUSION  Who gives blood for transfusions?  You may be able to donate blood to be used at a later date on yourself (autologous donation). Relatives can be asked to  donate blood. This is generally not any safer than if you have received blood from a stranger. The same precautions are taken to ensure safety when a relative's blood is donated. Healthy volunteers who are fully evaluated to make sure their blood is safe. This is blood bank blood. Transfusion therapy is the safest it has ever been in the practice of medicine. Before blood is taken from a donor, a complete history is taken to make sure that person has no history of diseases nor engages in risky social behavior (examples are intravenous drug use or sexual activity with multiple partners). The donor's travel history is screened to minimize risk of transmitting infections, such as malaria. The donated blood is tested for signs of infectious diseases, such as HIV and hepatitis. The blood is then tested to be sure it is compatible with you in order to minimize the chance of a transfusion reaction.  If you or a relative donates blood, this is often done in anticipation of surgery and is not appropriate for emergency situations. It takes many days to process the donated blood. RISKS AND COMPLICATIONS Although transfusion therapy is very safe and saves many lives, the main dangers of transfusion include:  Getting an infectious disease. Developing a transfusion reaction. This is an allergic reaction to something in the blood you were given. Every precaution is taken to prevent this. The decision to have a blood transfusion has been considered carefully by your caregiver before blood is given. Blood is not given unless the benefits outweigh the risks.  AFTER SURGERY INSTRUCTIONS  Return to work: 4-6 weeks if applicable  Activity: 1. Be up and out of the bed during the day.  Take a nap if needed.  You may walk up steps but be careful and use the hand rail.  Stair climbing will tire you more than you think, you may need to stop part way and rest.   2. No lifting or straining for 6 weeks over 10 pounds. No pushing,  pulling, straining for 6 weeks.  3. No driving for around 1 week(s).  Do not drive if you are taking narcotic pain medicine and make sure that your reaction time has returned.   4. You can shower as soon as the next day after surgery. Shower daily.  Use your regular soap and water (not directly on the incision) and pat your incision(s) dry afterwards; don't rub.  No tub baths or submerging your body in water until cleared by your surgeon. If you have the soap that was given to you by pre-surgical testing that was used before surgery, you do not need to use it afterwards because this can irritate your incisions.   5. No sexual activity and nothing in the vagina for 8 weeks.  6. You may experience a small amount of clear drainage from your incisions, which is normal.  If the drainage persists, increases, or changes color please call the office.  7. Do not use creams, lotions, or ointments such as neosporin on your incisions after surgery until advised by your surgeon because they can cause removal of the dermabond glue on your incisions.    8. You may experience vaginal spotting after surgery or around the 6-8 week mark from surgery when the stitches at the top of the vagina begin to dissolve.  The spotting is normal but if you experience heavy bleeding, call our office.  9. Take Tylenol or ibuprofen first for pain if you are able to take these medications and only use narcotic pain medication for severe pain not relieved by the Tylenol or Ibuprofen.  Monitor your Tylenol intake to a max of 4,000 mg in a 24 hour period. You can alternate these medications after surgery.  Diet: 1. Low sodium Heart Healthy Diet is recommended but you are cleared to resume your normal (before surgery) diet after your procedure.  2. It is safe to use a laxative, such as Miralax or Colace, if you have difficulty moving your bowels. You have been prescribed Sennakot-S to take at bedtime every evening after surgery to keep  bowel movements regular and to prevent constipation.    Wound Care: 1. Keep clean and dry.  Shower daily.  Reasons to call the Doctor: Fever - Oral temperature greater than 100.4 degrees Fahrenheit Foul-smelling vaginal discharge Difficulty urinating Nausea and vomiting Increased pain at the site of the incision that is unrelieved with pain medicine.  Difficulty breathing with or without chest pain New calf pain especially if only on one side Sudden, continuing increased vaginal bleeding with or without clots.   Contacts: For questions or concerns you should contact:  Dr. Jeral Pinch at [phone]  Joylene John, NP at [phone]  After Hours: call [phone] and have the GYN Oncologist paged/contacted (after 5 pm or on the weekends).  Messages sent via mychart are for non-urgent matters and are not responded to after hours so for urgent needs, please call the after hours number.

## 2022-03-06 NOTE — Progress Notes (Signed)
Gynecologic Oncology Return Clinic Visit  03/06/2022  Reason for Visit: Surgical planning  Treatment History: Oncology History  Endometrial cancer ([place])  10/27/2021 Pathology Results   FINAL MICROSCOPIC DIAGNOSIS:   A. ENDOMETRIUM, BIOPSY:  - Endometrioid adenocarcinoma with focal secretory features.  - See comment.   COMMENT:  The carcinoma has endometrioid features with a few small foci with secretory features.  There are several foci of solid pattern and the findings are consistent with FIGO grade 1/2.  Immunohistochemistry shows diffuse strong positivity estrogen receptor and Napsin A is negative.  P53 shows wild-type pattern.    11/07/2021 Initial Diagnosis   Endometrial cancer ([place])   11/14/2021 Imaging   MRI pelvis 1. Thickened (18 mm) endometrium, compatible with known endometrial malignancy. Evidence of full-thickness myometrial invasion by endometrial tumor throughout the left uterine body extending over 180 degrees of involvement. Probable early small focus of direct tumor invasion into the parametrial soft tissues in the anterior left uterine body as detailed. 2. Suspect a combination of direct endometrial tumor involvement of the upper endocervical canal and blood products within the endocervix. 3. No pelvic lymphadenopathy.  No adnexal masses.       11/24/2021 Imaging   CT abdomen and pelvis No evidence of abdominal metastatic disease or other acute findings.   Mild hepatic steatosis.   Colonic diverticulosis, without radiographic evidence of diverticulitis.   Aortic Atherosclerosis (ICD10-I70.0).     12/02/2021 Cancer Staging   Staging form: Corpus Uteri - Carcinoma and Carcinosarcoma, AJCC 8th Edition - Clinical stage from 12/02/2021: FIGO Stage IIIB (cT3b, cN0, cM0) - Signed by Heath Lark, MD on 12/02/2021 Stage prefix: Initial diagnosis   12/07/2021 Procedure   Status post placement of right IJ port catheter   12/13/2021 - 01/10/2022 Chemotherapy   Patient is on  Treatment Plan : Uterine Cisplatin days 1 and 29 + XRT      01/25/2022 Imaging   MRI pelvis Decreased endometrial thickness, consistent with interval response to therapy. Persistent deep myometrial invasion (> 50%) noted in the left lateral uterine corpus. No evidence of extra-uterine tumor extension.    No evidence of pelvic metastatic disease.   Sigmoid diverticulosis, without evidence of diverticulitis.     Interval History: Patient reports overall doing well.  Denies any pelvic or abdominal pain.  Denies any vaginal bleeding or discharge.  Weight remains stable, between 290 and 294 on her home scale.  CBGs have mostly been in the 160s.  Ate rice late last night and blood glucose was 197 this morning.  Past Medical/Surgical History: Past Medical History:  Diagnosis Date   Acute systolic heart failure (HCC)    Allergy    Asthma    Bilateral shoulder pain    Borderline hypertension    Bronchitis    CAD (coronary artery disease)    Chronic systolic CHF (congestive heart failure) (HCC)    Chronic systolic congestive heart failure, NYHA class 3 ([place]) 07/24/2018   Diabetes mellitus    Diagnosed in 2000, on insulin, Trulicity and metformin, not checking cgs at home   Gangrenous appendicitis with perforation s/p lap appendectomy 10/04/2017 10/04/2017   GERD (gastroesophageal reflux disease)    History of MI (myocardial infarction)    History of radiation therapy    Uterus- 12/14/21-02/01/22- Dr. Gery Pray   Hyperlipidemia    Hypertension    Hypothyroidism    MGUS (monoclonal gammopathy of unknown significance)    Mitral regurgitation    Myocardial infarction [facility]    Neuropathy  Obesity    Sleep apnea    uses CPAP   Statin intolerance    Uterine cancer [facility])     Past Surgical History:  Procedure Laterality Date   BREATH TEK H PYLORI  09/18/2011   Procedure: BREATH TEK H PYLORI;  Surgeon: Shann Medal, MD;  Location: [facility];  Service: General;  Laterality: N/A;    CESAREAN SECTION     x 3   IR IMAGING GUIDED PORT INSERTION  12/07/2021   LAPAROSCOPIC APPENDECTOMY N/A 10/03/2017   Procedure: APPENDECTOMY LAPAROSCOPIC;  Surgeon: Leighton Ruff, MD;  Location: [facility];  Service: General;  Laterality: N/A;   OPERATIVE ULTRASOUND N/A 02/01/2022   Procedure: OPERATIVE ULTRASOUND;  Surgeon: Gery Pray, MD;  Location: [facility];  Service: Urology;  Laterality: N/A;   RIGHT/LEFT HEART CATH AND CORONARY ANGIOGRAPHY N/A 02/12/2018   Procedure: RIGHT/LEFT HEART CATH AND CORONARY ANGIOGRAPHY;  Surgeon: Jettie Booze, MD;  Location: [facility];  Service: Cardiovascular;  Laterality: N/A;   TANDEM RING INSERTION N/A 02/01/2022   Procedure: TANDEM RING INSERTION;  Surgeon: Gery Pray, MD;  Location: [facility];  Service: Urology;  Laterality: N/A;   TUBAL LIGATION      Family History  Problem Relation Age of Onset   Alcohol abuse Mother    Arthritis Mother    Diabetes Mother    Sleep apnea Mother    Anxiety disorder Mother    Eating disorder Mother    Obesity Mother    Cancer Father    Alcohol abuse Father    Heart disease Father    Hypertension Father    Stroke Father    Sleep apnea Sister    Breast cancer Neg Hx    Colon cancer Neg Hx    Ovarian cancer Neg Hx    Endometrial cancer Neg Hx    Pancreatic cancer Neg Hx    Prostate cancer Neg Hx     Social History   Socioeconomic History   Marital status: Married    Spouse name: Not on file   Number of children: Not on file   Years of education: Not on file   Highest education level: Not on file  Occupational History   Occupation: retired Brewing technologist, worked at [employer]  Tobacco Use   Smoking status: Former    Packs/day: 1.00    Years: 15.00    Total pack years: 15.00    Types: Cigarettes    Quit date: 01/28/1990    Years since quitting: 32.1   Smokeless tobacco: Never  Vaping Use   Vaping Use: Never used  Substance and Sexual Activity   Alcohol use: Not Currently    Comment: occasional    Drug use: No   Sexual activity: Not Currently    Birth control/protection: Post-menopausal, Abstinence  Other Topics Concern   Not on file  Social History Narrative   Lives in [place]   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   Social Determinants of Health   Financial Resource Strain: Not on file  Food Insecurity: Not on file  Transportation Needs: Not on file  Physical Activity: Inactive (01/07/2021)   Exercise Vital Sign    Days of Exercise per Week: 0 days    Minutes of Exercise per Session: 0 min  Stress: Not on file  Social Connections: Not on file    Current Medications:  Current Outpatient Medications:    acetaminophen (TYLENOL) 500 MG tablet, Take 1,000 mg by mouth  every 6 (six) hours as needed for moderate pain., Disp: , Rfl:    albuterol (PROAIR HFA) 108 (90 Base) MCG/ACT inhaler, Inhale 2 puffs into the lungs every 6 (six) hours as needed. wheezing, Disp: 18 g, Rfl: 2   aspirin 81 MG chewable tablet, Chew 1 tablet (81 mg total) by mouth daily., Disp: 90 tablet, Rfl: 3   blood glucose meter kit and supplies, Use up to four times daily as directed. (FOR ICD-10 E10.9, E11.9)., Disp: 1 each, Rfl: 99   carvedilol (COREG) 6.25 MG tablet, Take 1 tablet (6.25 mg total) by mouth 2 (two) times daily with a meal., Disp: 60 tablet, Rfl: 1   diphenhydrAMINE (BENADRYL) 25 MG tablet, Take 25 mg by mouth at bedtime., Disp: , Rfl:    diphenoxylate-atropine (LOMOTIL) 2.5-0.025 MG tablet, Take 1 tablet by mouth 4 (four) times daily as needed for diarrhea or loose stools. (Patient not taking: Reported on 03/06/2022), Disp: 30 tablet, Rfl: 0   Dulaglutide (TRULICITY) 3 NF/6.2ZH SOPN, Inject 3 mg into the skin once a week., Disp: 6 mL, Rfl: 3   Evolocumab (REPATHA SURECLICK) 086 MG/ML SOAJ, Inject 1 pen into the skin every 14 (fourteen) days., Disp: 2 mL, Rfl: 11   furosemide (LASIX) 20 MG tablet, Take 1 tablet (20 mg total) by mouth daily., Disp: 30 tablet, Rfl: 1   glucose  blood test strip, Use as directed to test blood sugar, Disp: 100 each, Rfl: PRN   insulin glargine (LANTUS) 100 UNIT/ML Solostar Pen, Inject 25-40 Units into the skin at bedtime based on blood sugar readings and adjust via sliding scale., Disp: 45 mL, Rfl: 3   Insulin Pen Needle (UNIFINE PENTIPS) 32G X 4 MM MISC, USE AS DIRECTED ONCE A DAY, Disp: 100 each, Rfl: 3   levothyroxine (SYNTHROID) 50 MCG tablet, Take 1 tablet (50 mcg total) by mouth daily before breakfast., Disp: 90 tablet, Rfl: 3   lidocaine-prilocaine (EMLA) cream, Apply to affected area once (Patient taking differently: 1 application  daily as needed (to draw labs of infusion).), Disp: 30 g, Rfl: 3   loperamide (IMODIUM) 2 MG capsule, Take 2 mg by mouth as needed for diarrhea or loose stools., Disp: , Rfl:    Magnesium 250 MG TABS, Take 250 mg by mouth daily., Disp: , Rfl:    magnesium oxide (MAG-OX) 400 (240 Mg) MG tablet, Take 1 tablet (400 mg total) by mouth daily. (Patient not taking: Reported on 03/03/2022), Disp: 30 tablet, Rfl: 0   metFORMIN (GLUCOPHAGE) 1000 MG tablet, Take 1 tablet (1,000 mg total) by mouth 2 (two) times daily with a meal., Disp: 180 tablet, Rfl: 3   Multiple Vitamin (MULITIVITAMIN WITH MINERALS) TABS, Take 1 tablet by mouth daily with breakfast., Disp: , Rfl:    NON FORMULARY, Pt uses a cpap nightly, Disp: , Rfl:    ondansetron (ZOFRAN) 8 MG tablet, Take 1 tablet (8 mg total) by mouth 2 (two) times daily as needed. Start on the third day after chemotherapy., Disp: 30 tablet, Rfl: 1   OneTouch Delica Lancets 57Q MISC, use as directed, Disp: 100 each, Rfl: 1   prochlorperazine (COMPAZINE) 10 MG tablet, Take 1 tablet (10 mg total) by mouth every 6 (six) hours as needed (Nausea or vomiting)., Disp: 30 tablet, Rfl: 1   sacubitril-valsartan (ENTRESTO) 49-51 MG, Take 1 tablet by mouth 2 (two) times daily., Disp: 60 tablet, Rfl: 4   senna-docusate (SENOKOT-S) 8.6-50 MG tablet, Take 2 tablets by mouth at bedtime. For AFTER  surgery, do not take if having diarrhea, Disp: 30 tablet, Rfl: 0   spironolactone (ALDACTONE) 25 MG tablet, Take 1 tablet (25 mg total) by mouth daily., Disp: 60 tablet, Rfl: 1   traMADol (ULTRAM) 50 MG tablet, Take 1 tablet (50 mg total) by mouth every 6 (six) hours as needed for severe pain. For AFTER surgery only, do not take and drive, Disp: 10 tablet, Rfl: 0   triamcinolone ointment (KENALOG) 0.1 %, Apply 1 application topically two times daily to affected area(s) as needed, sparing use to avoid whitening/thinning skin, Disp: 30 g, Rfl: 1  Review of Systems: Denies appetite changes, fevers, chills, fatigue, unexplained weight changes. Denies hearing loss, neck lumps or masses, mouth sores, ringing in ears or voice changes. Denies cough or wheezing.  Denies shortness of breath. Denies chest pain or palpitations. Denies leg swelling. Denies abdominal distention, pain, blood in stools, constipation, diarrhea, nausea, vomiting, or early satiety. Denies pain with intercourse, dysuria, frequency, hematuria or incontinence. Denies hot flashes, pelvic pain, vaginal bleeding or vaginal discharge.   Denies joint pain, back pain or muscle pain/cramps. Denies itching, rash, or wounds. Denies dizziness, headaches, numbness or seizures. Denies swollen lymph nodes or glands, denies easy bruising or bleeding. Denies anxiety, depression, confusion, or decreased concentration.  Physical Exam: Vital signs: Blood pressure 107/67, pulse 93 respiratory rate 16, temperature 36.7 C, oxygen saturation 100% on room air General: Alert, oriented, no acute distress. HEENT: Normocephalic, atraumatic, sclera anicteric. Chest: Unlabored breathing on room air.  Laboratory & Radiologic Studies: Hgb A1c on 01/27/22: 8.6%  Assessment & Plan: Terri Heath is a 66 y.o. 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  Patient is overall doing well.  Reviewed findings on exam at the time of surgery with no parametrial involvement.  Recent MRI shows no extrauterine disease.  Patient has had significant improvement in her diabetes with glycemic control almost in range.  She also was able to achieve some weight loss.  I think it is reasonable to move forward with surgery as we have discussed.  We discussed the plan for for a robotic assisted hysterectomy, bilateral salpingo-oophorectomy, possible laparotomy. The risks of surgery were discussed in detail and she understands these to include infection; wound separation; hernia; vaginal cuff separation, injury to adjacent organs such as bowel, bladder, blood vessels, ureters and nerves; bleeding which may require blood transfusion; anesthesia risk; thromboembolic events; possible death; unforeseen complications; possible need for re-exploration; medical complications such as heart attack, stroke, pleural effusion and pneumonia. The patient will receive DVT and antibiotic prophylaxis as indicated. She voiced a clear understanding. She had the opportunity to ask questions. Perioperative instructions were reviewed with her. Prescriptions for post-op medications were sent to her pharmacy of choice.  In the setting of recent radiation, we also discussed increased morbidity related to surgery and difficulty healing postoperatively.  Stressed the importance of continued good glycemic control to help with postoperative healing and decrease the risk of wound infection.  28 minutes of total time was spent for this patient encounter, including preparation, face-to-face counseling with the patient and coordination of care, and documentation of the encounter.  Jeral Pinch, MD  Division of Gynecologic Oncology  Department of Obstetrics and Gynecology  [facility]

## 2022-03-07 ENCOUNTER — Other Ambulatory Visit (HOSPITAL_COMMUNITY): Payer: Self-pay

## 2022-03-07 MED ORDER — INSULIN GLARGINE 100 UNIT/ML SOLOSTAR PEN: 25.0000 [IU] | PEN_INJECTOR | Freq: Every day | SUBCUTANEOUS | 3 refills | Status: DC

## 2022-03-07 NOTE — Patient Instructions (Signed)
DUE TO COVID-19 ONLY TWO VISITORS  (aged 65 and older)  ARE ALLOWED TO COME WITH YOU AND STAY IN THE WAITING ROOM ONLY DURING PRE OP AND PROCEDURE.   **NO VISITORS ARE ALLOWED IN THE SHORT STAY AREA OR RECOVERY ROOM!!**   Your procedure is scheduled on: 03/21/22   Report to [facility] Main Entrance    Report to admitting at 8:45 AM   Call this number if you have problems the morning of surgery [phone]   Do not eat food :After Midnight.   After Midnight you may have the following liquids until 8:00 AM DAY OF SURGERY  Water Black Coffee (sugar ok, NO MILK/CREAM OR CREAMERS)  Tea (sugar ok, NO MILK/CREAM OR CREAMERS) regular and decaf                             Plain Jell-O (NO RED)                                           Fruit ices (not with fruit pulp, NO RED)                                     Popsicles (NO RED)                                                                  Juice: apple, WHITE grape, WHITE cranberry Sports drinks like Gatorade (NO RED) Clear broth(vegetable,chicken,beef)   The day of surgery:  Drink ONE (1) Pre-Surgery G2 at 8:00 AM the morning of surgery. Drink in one sitting. Do not sip.  This drink was given to you during your hospital  pre-op appointment visit. Nothing else to drink after completing the  Pre-Surgery G2.          If you have questions, please contact your surgeon's office.   FOLLOW BOWEL PREP AND ANY ADDITIONAL PRE OP INSTRUCTIONS YOU RECEIVED FROM YOUR SURGEON'S OFFICE!!!     Oral Hygiene is also important to reduce your risk of infection.                                    Remember - BRUSH YOUR TEETH THE MORNING OF SURGERY WITH YOUR REGULAR TOOTHPASTE   Do NOT smoke after Midnight   Take these medicines the morning of surgery with A SIP OF WATER: Tylenol, Inhalers, Carvedilol, Levothyroxine   DO NOT TAKE ANY ORAL DIABETIC MEDICATIONS DAY OF YOUR SURGERY  How to Manage Your Diabetes Before and After  Surgery  Why is it important to control my blood sugar before and after surgery? Improving blood sugar levels before and after surgery helps healing and can limit problems. A way of improving blood sugar control is eating a healthy diet by:  Eating less sugar and carbohydrates  Increasing activity/exercise  Talking with your doctor about reaching your blood sugar goals High blood sugars (greater than 180 mg/dL) can raise your risk of infections  and slow your recovery, so you will need to focus on controlling your diabetes during the weeks before surgery. Make sure that the doctor who takes care of your diabetes knows about your planned surgery including the date and location.  How do I manage my blood sugar before surgery? Check your blood sugar at least 4 times a day, starting 2 days before surgery, to make sure that the level is not too high or low. Check your blood sugar the morning of your surgery when you wake up and every 2 hours until you get to the Short Stay unit. If your blood sugar is less than 70 mg/dL, you will need to treat for low blood sugar: Do not take insulin. Treat a low blood sugar (less than 70 mg/dL) with  cup of clear juice (cranberry or apple), 4 glucose tablets, OR glucose gel. Recheck blood sugar in 15 minutes after treatment (to make sure it is greater than 70 mg/dL). If your blood sugar is not greater than 70 mg/dL on recheck, call [phone] for further instructions. Report your blood sugar to the short stay nurse when you get to Short Stay.  If you are admitted to the hospital after surgery: Your blood sugar will be checked by the staff and you will probably be given insulin after surgery (instead of oral diabetes medicines) to make sure you have good blood sugar levels. The goal for blood sugar control after surgery is 80-180 mg/dL.   WHAT DO I DO ABOUT MY DIABETES MEDICATION?  Do not take oral diabetes medicines (pills) the morning of surgery.  THE DAY  BEFORE SURGERY, take Metformin as prescribed. Take 50% of Lantus        THE MORNING OF SURGERY, do not take Metformin or Lantus.  The day of surgery, do not take other diabetes injectables, including Byetta (exenatide), Bydureon (exenatide ER), Victoza (liraglutide), or Trulicity (dulaglutide).  If your CBG is greater than 220 mg/dL, you may take  of your sliding scale  (correction) dose of insulin.  Reviewed and Endorsed by [facility] Patient Education Committee, August 2015   Bring CPAP mask and tubing day of surgery.                              You may not have any metal on your body including hair pins, jewelry, and body piercing             Do not wear make-up, lotions, powders, perfumes, or deodorant  Do not wear nail polish including gel and S&S, artificial/acrylic nails, or any other type of covering on natural nails including finger and toenails. If you have artificial nails, gel coating, etc. that needs to be removed by a nail salon please have this removed prior to surgery or surgery may need to be canceled/ delayed if the surgeon/ anesthesia feels like they are unable to be safely monitored.   Do not shave  48 hours prior to surgery.    Do not bring valuables to the hospital. [place].   Contacts, dentures or bridgework may not be worn into surgery.  DO NOT [place]. PHARMACY WILL DISPENSE MEDICATIONS LISTED ON YOUR MEDICATION LIST TO YOU DURING YOUR ADMISSION [place]!    Patients discharged on the day of surgery will not be allowed  to drive home.  Someone NEEDS to stay with you for the first 24 hours after anesthesia.   Special Instructions: Bring a copy of your healthcare power of attorney and living will documents         the day of surgery if you haven't scanned them before.              Please read over the following fact sheets you were given: IF YOU HAVE QUESTIONS ABOUT  YOUR PRE-OP INSTRUCTIONS PLEASE CALL [facility] - Preparing for Surgery Before surgery, you can play an important role.  Because skin is not sterile, your skin needs to be as free of germs as possible.  You can reduce the number of germs on your skin by washing with CHG (chlorahexidine gluconate) soap before surgery.  CHG is an antiseptic cleaner which kills germs and bonds with the skin to continue killing germs even after washing. Please DO NOT use if you have an allergy to CHG or antibacterial soaps.  If your skin becomes reddened/irritated stop using the CHG and inform your nurse when you arrive at Short Stay. Do not shave (including legs and underarms) for at least 48 hours prior to the first CHG shower.  You may shave your face/neck.  Please follow these instructions carefully:  1.  Shower with CHG Soap the night before surgery and the  morning of surgery.  2.  If you choose to wash your hair, wash your hair first as usual with your normal  shampoo.  3.  After you shampoo, rinse your hair and body thoroughly to remove the shampoo.                             4.  Use CHG as you would any other liquid soap.  You can apply chg directly to the skin and wash.  Gently with a scrungie or clean washcloth.  5.  Apply the CHG Soap to your body ONLY FROM THE NECK DOWN.   Do   not use on face/ open                           Wound or open sores. Avoid contact with eyes, ears mouth and   genitals (private parts).                       Wash face,  Genitals (private parts) with your normal soap.             6.  Wash thoroughly, paying special attention to the area where your    surgery  will be performed.  7.  Thoroughly rinse your body with warm water from the neck down.  8.  DO NOT shower/wash with your normal soap after using and rinsing off the CHG Soap.                9.  Pat yourself dry with a clean towel.            10.  Wear clean pajamas.            11.  Place clean  sheets on your bed the night of your first shower and do not  sleep with pets. Day of Surgery : Do not apply any lotions/deodorants the morning of surgery.  Please wear clean clothes to the hospital/surgery center.  FAILURE  TO FOLLOW THESE INSTRUCTIONS MAY RESULT IN THE CANCELLATION OF YOUR SURGERY  PATIENT SIGNATURE_________________________________  NURSE SIGNATURE__________________________________  ________________________________________________________________________  WHAT IS A BLOOD TRANSFUSION? Blood Transfusion Information  A transfusion is the replacement of blood or some of its parts. Blood is made up of multiple cells which provide different functions. Red blood cells carry oxygen and are used for blood loss replacement. White blood cells fight against infection. Platelets control bleeding. Plasma helps clot blood. Other blood products are available for specialized needs, such as hemophilia or other clotting disorders. BEFORE THE TRANSFUSION  Who gives blood for transfusions?  Healthy volunteers who are fully evaluated to make sure their blood is safe. This is blood bank blood. Transfusion therapy is the safest it has ever been in the practice of medicine. Before blood is taken from a donor, a complete history is taken to make sure that person has no history of diseases nor engages in risky social behavior (examples are intravenous drug use or sexual activity with multiple partners). The donor's travel history is screened to minimize risk of transmitting infections, such as malaria. The donated blood is tested for signs of infectious diseases, such as HIV and hepatitis. The blood is then tested to be sure it is compatible with you in order to minimize the chance of a transfusion reaction. If you or a relative donates blood, this is often done in anticipation of surgery and is not appropriate for emergency situations. It takes many days to process the donated blood. RISKS AND  COMPLICATIONS Although transfusion therapy is very safe and saves many lives, the main dangers of transfusion include:  Getting an infectious disease. Developing a transfusion reaction. This is an allergic reaction to something in the blood you were given. Every precaution is taken to prevent this. The decision to have a blood transfusion has been considered carefully by your caregiver before blood is given. Blood is not given unless the benefits outweigh the risks. AFTER THE TRANSFUSION Right after receiving a blood transfusion, you will usually feel much better and more energetic. This is especially true if your red blood cells have gotten low (anemic). The transfusion raises the level of the red blood cells which carry oxygen, and this usually causes an energy increase. The nurse administering the transfusion will monitor you carefully for complications. HOME CARE INSTRUCTIONS  No special instructions are needed after a transfusion. You may find your energy is better. Speak with your caregiver about any limitations on activity for underlying diseases you may have. SEEK MEDICAL CARE IF:  Your condition is not improving after your transfusion. You develop redness or irritation at the intravenous (IV) site. SEEK IMMEDIATE MEDICAL CARE IF:  Any of the following symptoms occur over the next 12 hours: Shaking chills. You have a temperature by mouth above 102 F (38.9 C), not controlled by medicine. Chest, back, or muscle pain. People around you feel you are not acting correctly or are confused. Shortness of breath or difficulty breathing. Dizziness and fainting. You get a rash or develop hives. You have a decrease in urine output. Your urine turns a dark color or changes to pink, red, or brown. Any of the following symptoms occur over the next 10 days: You have a temperature by mouth above 102 F (38.9 C), not controlled by medicine. Shortness of breath. Weakness after normal activity. The  white part of the eye turns yellow (jaundice). You have a decrease in the amount of urine or are urinating less often. Your urine  turns a dark color or changes to pink, red, or brown. Document Released: 08/11/2000 Document Revised: 11/06/2011 Document Reviewed: 03/30/2008 [facility] Patient Information 2014 [place], [place].  _______________________________________________________________________

## 2022-03-07 NOTE — Progress Notes (Signed)
COVID Vaccine Completed: yes x2  Date of COVID positive in last 90 days:  PCP - Jacolyn Reedy, NP Cardiologist - Larae Grooms, MD  Cardiac clearance 01/30/22 by Julaine Hua in Epic  Chest x-ray -  EKG - 01/12/22 Epic Stress Test -  ECHO - 06/23/21 Epic Cardiac Cath - 02/12/18 Epic Pacemaker/ICD device last checked: Spinal Cord Stimulator:  Bowel Prep - light diet day before  Sleep Study -  CPAP -   Fasting Blood Sugar -  Checks Blood Sugar _____ times a day  Blood Thinner Instructions: Aspirin Instructions: ASA 81, hold 5-7 days Last Dose:  Activity level:  Can go up a flight of stairs and perform activities of daily living without stopping and without symptoms of chest pain or shortness of breath.  Able to exercise without symptoms  Unable to go up a flight of stairs without symptoms of     Anesthesia review: CAD, DM2, OSA, CHF, HTN, cardiomyopathy, [place]??  Patient denies shortness of breath, fever, cough and chest pain at PAT appointment  Patient verbalized understanding of instructions that were given to them at the PAT appointment. Patient was also instructed that they will need to review over the PAT instructions again at home before surgery.

## 2022-03-07 NOTE — Telephone Encounter (Signed)
Lantus sent to [facility].

## 2022-03-08 ENCOUNTER — Encounter (HOSPITAL_COMMUNITY): Payer: Self-pay

## 2022-03-08 ENCOUNTER — Encounter (HOSPITAL_COMMUNITY)
Admit: 2022-03-08 | Discharge: 2022-03-08 | Disposition: A | Discharge status: Home / Self Care | Payer: Medicare HMO | Source: Ambulatory Visit | Attending: Gynecologic Oncology | Admitting: Gynecologic Oncology

## 2022-03-08 ENCOUNTER — Telehealth: Payer: Self-pay

## 2022-03-08 VITALS — BP 125/70 | HR 96 | Temp 97.9°F | Resp 16 | Ht 67.0 in | Wt 288.2 lb

## 2022-03-08 DIAGNOSIS — C541 Malignant neoplasm of endometrium: Secondary | ICD-10-CM

## 2022-03-08 DIAGNOSIS — E1159 Type 2 diabetes mellitus with other circulatory complications: Secondary | ICD-10-CM | POA: Diagnosis not present

## 2022-03-08 DIAGNOSIS — E1165 Type 2 diabetes mellitus with hyperglycemia: Secondary | ICD-10-CM

## 2022-03-08 DIAGNOSIS — Z01812 Encounter for preprocedural laboratory examination: Secondary | ICD-10-CM | POA: Insufficient documentation

## 2022-03-08 DIAGNOSIS — Z01818 Encounter for other preprocedural examination: Secondary | ICD-10-CM

## 2022-03-08 LAB — COMPREHENSIVE METABOLIC PANEL
ALT: 20 U/L (ref 0–44)
AST: 17 U/L (ref 15–41)
Albumin: 3.4 g/dL — ABNORMAL LOW (ref 3.5–5.0)
Alkaline Phosphatase: 59 U/L (ref 38–126)
Anion gap: 8 (ref 5–15)
BUN: 20 mg/dL (ref 8–23)
CO2: 25 mmol/L (ref 22–32)
Calcium: 9.4 mg/dL (ref 8.9–10.3)
Chloride: 108 mmol/L (ref 98–111)
Creatinine, Ser: 0.97 mg/dL (ref 0.44–1.00)
GFR, Estimated: >60 mL/min (ref >60)
Glucose, Bld: 199 mg/dL — ABNORMAL HIGH (ref 70–99)
Potassium: 4.9 mmol/L (ref 3.5–5.1)
Sodium: 141 mmol/L (ref 135–145)
Total Bilirubin: 0.4 mg/dL (ref 0.3–1.2)
Total Protein: 7.4 g/dL (ref 6.5–8.1)

## 2022-03-08 LAB — SURGICAL PCR SCREEN
MRSA, PCR: NEGATIVE
Staphylococcus aureus: NEGATIVE

## 2022-03-08 LAB — TYPE AND SCREEN
ABO/RH(D): O POS
Antibody Screen: NEGATIVE

## 2022-03-08 LAB — HEMOGLOBIN A1C
Hgb A1c MFr Bld: 8.4 % — ABNORMAL HIGH (ref 4.8–5.6)
Mean Plasma Glucose: 194.38 mg/dL

## 2022-03-08 LAB — GLUCOSE, CAPILLARY: Glucose-Capillary: 190 mg/dL — ABNORMAL HIGH (ref 70–99)

## 2022-03-08 NOTE — Patient Instructions (Signed)
DUE TO COVID-19 ONLY TWO VISITORS  (aged 65 and older)  ARE ALLOWED TO COME WITH YOU AND STAY IN THE WAITING ROOM ONLY DURING PRE OP AND PROCEDURE.   **NO VISITORS ARE ALLOWED IN THE SHORT STAY AREA OR RECOVERY ROOM!!**    Your procedure is scheduled on: 03/21/22   Report to [facility] Main Entrance    Report to admitting at 8:45 AM   Call this number if you have problems the morning of surgery [phone]   Do not eat food :After Midnight.   After Midnight you may have the following liquids until 8:00 AM DAY OF SURGERY  Water Black Coffee (sugar ok, NO MILK/CREAM OR CREAMERS)  Tea (sugar ok, NO MILK/CREAM OR CREAMERS) regular and decaf                             Plain Jell-O (NO RED)                                           Fruit ices (not with fruit pulp, NO RED)                                     Popsicles (NO RED)                                                                  Juice: apple, WHITE grape, WHITE cranberry Sports drinks like Gatorade (NO RED) Clear broth(vegetable,chicken,beef)    The day of surgery:  Drink ONE (1) Pre-Surgery G2 at 8:00 AM the morning of surgery. Drink in one sitting. Do not sip.  This drink was given to you during your hospital  pre-op appointment visit. Nothing else to drink after completing the  Pre-Surgery G2.          If you have questions, please contact your surgeon's office.   FOLLOW BOWEL PREP AND ANY ADDITIONAL PRE OP INSTRUCTIONS YOU RECEIVED FROM YOUR SURGEON'S OFFICE!!!     Oral Hygiene is also important to reduce your risk of infection.                                    Remember - BRUSH YOUR TEETH THE MORNING OF SURGERY WITH YOUR REGULAR TOOTHPASTE   Do NOT smoke after Midnight   Take these medicines the morning of surgery with A SIP OF WATER: Tylenol, Inhalers, Carvedilol, Levothyroxine  DO NOT TAKE ANY ORAL DIABETIC MEDICATIONS DAY OF YOUR SURGERY  How to Manage Your Diabetes Before and After  Surgery  Why is it important to control my blood sugar before and after surgery? Improving blood sugar levels before and after surgery helps healing and can limit problems. A way of improving blood sugar control is eating a healthy diet by:  Eating less sugar and carbohydrates  Increasing activity/exercise  Talking with your doctor about reaching your blood sugar goals High blood sugars (greater than 180 mg/dL) can raise your risk of  infections and slow your recovery, so you will need to focus on controlling your diabetes during the weeks before surgery. Make sure that the doctor who takes care of your diabetes knows about your planned surgery including the date and location.  How do I manage my blood sugar before surgery? Check your blood sugar at least 4 times a day, starting 2 days before surgery, to make sure that the level is not too high or low. Check your blood sugar the morning of your surgery when you wake up and every 2 hours until you get to the Short Stay unit. If your blood sugar is less than 70 mg/dL, you will need to treat for low blood sugar: Do not take insulin. Treat a low blood sugar (less than 70 mg/dL) with  cup of clear juice (cranberry or apple), 4 glucose tablets, OR glucose gel. Recheck blood sugar in 15 minutes after treatment (to make sure it is greater than 70 mg/dL). If your blood sugar is not greater than 70 mg/dL on recheck, call [phone] for further instructions. Report your blood sugar to the short stay nurse when you get to Short Stay.  If you are admitted to the hospital after surgery: Your blood sugar will be checked by the staff and you will probably be given insulin after surgery (instead of oral diabetes medicines) to make sure you have good blood sugar levels. The goal for blood sugar control after surgery is 80-180 mg/dL.   WHAT DO I DO ABOUT MY DIABETES MEDICATION?  Do not take oral diabetes medicines (pills) the morning of surgery.  THE DAY  BEFORE SURGERY, take Metformin and Lantus as prescribed.      THE MORNING OF SURGERY, take 50% of Lantus dose. Do not take Metformin.  The day of surgery, do not take other diabetes injectables, including Byetta (exenatide), Bydureon (exenatide ER), Victoza (liraglutide), or Trulicity (dulaglutide).  Reviewed and Endorsed by [facility] Patient Education Committee, August 2015   Bring CPAP mask and tubing day of surgery.                              You may not have any metal on your body including hair pins, jewelry, and body piercing             Do not wear make-up, lotions, powders, perfumes, or deodorant  Do not wear nail polish including gel and S&S, artificial/acrylic nails, or any other type of covering on natural nails including finger and toenails. If you have artificial nails, gel coating, etc. that needs to be removed by a nail salon please have this removed prior to surgery or surgery may need to be canceled/ delayed if the surgeon/ anesthesia feels like they are unable to be safely monitored.   Do not shave  48 hours prior to surgery.    Do not bring valuables to the hospital. [place].   Contacts, dentures or bridgework may not be worn into surgery.  DO NOT [place]. PHARMACY WILL DISPENSE MEDICATIONS LISTED ON YOUR MEDICATION LIST TO YOU DURING YOUR ADMISSION [place]!    Patients discharged on the day of surgery will not be allowed to drive home.  Someone NEEDS to stay with you for the first 24 hours after anesthesia.   Special Instructions: Bring  a copy of your healthcare power of attorney and living will documents         the day of surgery if you haven't scanned them before.              Please read over the following fact sheets you were given: IF YOU HAVE QUESTIONS ABOUT YOUR PRE-OP INSTRUCTIONS PLEASE CALL [place] - Preparing for  Surgery Before surgery, you can play an important role.  Because skin is not sterile, your skin needs to be as free of germs as possible.  You can reduce the number of germs on your skin by washing with CHG (chlorahexidine gluconate) soap before surgery.  CHG is an antiseptic cleaner which kills germs and bonds with the skin to continue killing germs even after washing. Please DO NOT use if you have an allergy to CHG or antibacterial soaps.  If your skin becomes reddened/irritated stop using the CHG and inform your nurse when you arrive at Short Stay. Do not shave (including legs and underarms) for at least 48 hours prior to the first CHG shower.  You may shave your face/neck.  Please follow these instructions carefully:  1.  Shower with CHG Soap the night before surgery and the  morning of surgery.  2.  If you choose to wash your hair, wash your hair first as usual with your normal  shampoo.  3.  After you shampoo, rinse your hair and body thoroughly to remove the shampoo.                             4.  Use CHG as you would any other liquid soap.  You can apply chg directly to the skin and wash.  Gently with a scrungie or clean washcloth.  5.  Apply the CHG Soap to your body ONLY FROM THE NECK DOWN.   Do   not use on face/ open                           Wound or open sores. Avoid contact with eyes, ears mouth and   genitals (private parts).                       Wash face,  Genitals (private parts) with your normal soap.             6.  Wash thoroughly, paying special attention to the area where your    surgery  will be performed.  7.  Thoroughly rinse your body with warm water from the neck down.  8.  DO NOT shower/wash with your normal soap after using and rinsing off the CHG Soap.                9.  Pat yourself dry with a clean towel.            10.  Wear clean pajamas.            11.  Place clean sheets on your bed the night of your first shower and do not  sleep with pets. Day of Surgery  : Do not apply any lotions/deodorants the morning of surgery.  Please wear clean clothes to the hospital/surgery center.  FAILURE TO FOLLOW THESE INSTRUCTIONS MAY RESULT IN THE CANCELLATION OF YOUR SURGERY  PATIENT SIGNATURE_________________________________  NURSE SIGNATURE__________________________________  ________________________________________________________________________  Incentive Spirometer  An incentive spirometer is a tool that can help keep your lungs clear and active. This tool measures how well you are filling your lungs with each breath. Taking long deep breaths may help reverse or decrease the chance of developing breathing (pulmonary) problems (especially infection) following: A long period of time when you are unable to move or be active. BEFORE THE PROCEDURE  If the spirometer includes an indicator to show your best effort, your nurse or respiratory therapist will set it to a desired goal. If possible, sit up straight or lean slightly forward. Try not to slouch. Hold the incentive spirometer in an upright position. INSTRUCTIONS FOR USE  Sit on the edge of your bed if possible, or sit up as far as you can in bed or on a chair. Hold the incentive spirometer in an upright position. Breathe out normally. Place the mouthpiece in your mouth and seal your lips tightly around it. Breathe in slowly and as deeply as possible, raising the piston or the ball toward the top of the column. Hold your breath for 3-5 seconds or for as long as possible. Allow the piston or ball to fall to the bottom of the column. Remove the mouthpiece from your mouth and breathe out normally. Rest for a few seconds and repeat Steps 1 through 7 at least 10 times every 1-2 hours when you are awake. Take your time and take a few normal breaths between deep breaths. The spirometer may include an indicator to show your best effort. Use the indicator as a goal to work toward during each repetition. After  each set of 10 deep breaths, practice coughing to be sure your lungs are clear. If you have an incision (the cut made at the time of surgery), support your incision when coughing by placing a pillow or rolled up towels firmly against it. Once you are able to get out of bed, walk around indoors and cough well. You may stop using the incentive spirometer when instructed by your caregiver.  RISKS AND COMPLICATIONS Take your time so you do not get dizzy or light-headed. If you are in pain, you may need to take or ask for pain medication before doing incentive spirometry. It is harder to take a deep breath if you are having pain. AFTER USE Rest and breathe slowly and easily. It can be helpful to keep track of a log of your progress. Your caregiver can provide you with a simple table to help with this. If you are using the spirometer at home, follow these instructions: [place] IF:  You are having difficultly using the spirometer. You have trouble using the spirometer as often as instructed. Your pain medication is not giving enough relief while using the spirometer. You develop fever of 100.5 F (38.1 C) or higher. SEEK IMMEDIATE MEDICAL CARE IF:  You cough up bloody sputum that had not been present before. You develop fever of 102 F (38.9 C) or greater. You develop worsening pain at or near the incision site. MAKE SURE YOU:  Understand these instructions. Will watch your condition. Will get help right away if you are not doing well or get worse. Document Released: 12/25/2006 Document Revised: 11/06/2011 Document Reviewed: 02/25/2007 Exit[employer] Patient Information 2014 [employer], [place].   ________________________________________________________________________  WHAT IS A BLOOD TRANSFUSION? Blood Transfusion Information  A transfusion is the replacement of blood or some of its parts. Blood is made up of multiple cells which provide different functions. Red blood cells  carry oxygen  and are used for blood loss replacement. White blood cells fight against infection. Platelets control bleeding. Plasma helps clot blood. Other blood products are available for specialized needs, such as hemophilia or other clotting disorders. BEFORE THE TRANSFUSION  Who gives blood for transfusions?  Healthy volunteers who are fully evaluated to make sure their blood is safe. This is blood bank blood. Transfusion therapy is the safest it has ever been in the practice of medicine. Before blood is taken from a donor, a complete history is taken to make sure that person has no history of diseases nor engages in risky social behavior (examples are intravenous drug use or sexual activity with multiple partners). The donor's travel history is screened to minimize risk of transmitting infections, such as malaria. The donated blood is tested for signs of infectious diseases, such as HIV and hepatitis. The blood is then tested to be sure it is compatible with you in order to minimize the chance of a transfusion reaction. If you or a relative donates blood, this is often done in anticipation of surgery and is not appropriate for emergency situations. It takes many days to process the donated blood. RISKS AND COMPLICATIONS Although transfusion therapy is very safe and saves many lives, the main dangers of transfusion include:  Getting an infectious disease. Developing a transfusion reaction. This is an allergic reaction to something in the blood you were given. Every precaution is taken to prevent this. The decision to have a blood transfusion has been considered carefully by your caregiver before blood is given. Blood is not given unless the benefits outweigh the risks. AFTER THE TRANSFUSION Right after receiving a blood transfusion, you will usually feel much better and more energetic. This is especially true if your red blood cells have gotten low (anemic). The transfusion raises the level of the red blood  cells which carry oxygen, and this usually causes an energy increase. The nurse administering the transfusion will monitor you carefully for complications. HOME CARE INSTRUCTIONS  No special instructions are needed after a transfusion. You may find your energy is better. Speak with your caregiver about any limitations on activity for underlying diseases you may have. SEEK MEDICAL CARE IF:  Your condition is not improving after your transfusion. You develop redness or irritation at the intravenous (IV) site. SEEK IMMEDIATE MEDICAL CARE IF:  Any of the following symptoms occur over the next 12 hours: Shaking chills. You have a temperature by mouth above 102 F (38.9 C), not controlled by medicine. Chest, back, or muscle pain. People around you feel you are not acting correctly or are confused. Shortness of breath or difficulty breathing. Dizziness and fainting. You get a rash or develop hives. You have a decrease in urine output. Your urine turns a dark color or changes to pink, red, or brown. Any of the following symptoms occur over the next 10 days: You have a temperature by mouth above 102 F (38.9 C), not controlled by medicine. Shortness of breath. Weakness after normal activity. The white part of the eye turns yellow (jaundice). You have a decrease in the amount of urine or are urinating less often. Your urine turns a dark color or changes to pink, red, or brown. Document Released: 08/11/2000 Document Revised: 11/06/2011 Document Reviewed: 03/30/2008 [facility] Patient Information 2014 [place], [place].  _______________________________________________________________________

## 2022-03-08 NOTE — Telephone Encounter (Signed)
Spoke with Terri Heath this morning and reviewed that she is to hold her ASA 5 days before her surgery on 03/21/22. Last dose will be on 03/15/22. Patient verbalizes understanding.

## 2022-03-08 NOTE — Progress Notes (Addendum)
COVID Vaccine Completed: yes x2   Date of COVID positive in last 90 days: no   PCP - Jacolyn Reedy, NP Cardiologist - Dr. Aundra Dubin   Cardiac clearance 01/30/22 by Julaine Hua in Epic   Chest x-ray - n/a EKG - 01/12/22 Epic Stress Test - n/a ECHO - 06/23/21 Epic Cardiac Cath - 02/12/18 Epic Pacemaker/ICD device last checked: n/a Spinal Cord Stimulator:n/a   Bowel Prep - light diet day before   Sleep Study - yes, positive CPAP - has not used in 6 mo   Fasting Blood Sugar - 137-215 Checks Blood Sugar 1 times a day   Blood Thinner Instructions: Aspirin Instructions: ASA 81, hold 5 days Last Dose:   Activity level:   Can and perform activities of daily living without stopping and without symptoms of chest pain or shortness of breath. Difficulty with stairs due to weight and weakness in legs.                      Anesthesia review: CAD, DM2, OSA, CHF, HTN, cardiomyopathy, Port in place, last dose of chemo 01/09/22, last radiation 02/04/22, A1C 8.4   Patient denies shortness of breath, fever, cough and chest pain at PAT appointment   Patient verbalized understanding of instructions that were given to them at the PAT appointment. Patient was also instructed that they will need to review over the PAT instructions again at home before surgery.

## 2022-03-08 NOTE — Progress Notes (Signed)
A1C 8.4, results routed to Dr. Berline Lopes

## 2022-03-10 ENCOUNTER — Encounter: Payer: Self-pay | Admitting: Hematology and Oncology

## 2022-03-10 NOTE — Progress Notes (Signed)
Patient here for a pre-operative discussion prior to her scheduled surgery on 03/21/2022. She is scheduled for robotic assisted total laparoscopic hysterectomy, bilateral salpingo-oophorectomy, and any other indicated procedures. The surgery was discussed in detail.  See after visit summary for additional details.      Discussed post-op pain management in detail including the aspects of the enhanced recovery pathway.  Advised her that a new prescription would be sent in for tramadol and it is only to be used for after her upcoming surgery.  We discussed the use of tylenol post-op and to monitor for a maximum of 4,000 mg in a 24 hour period.  Also prescribed sennakot to be used after surgery and to hold if having loose stools.  Discussed bowel regimen in detail.     Discussed the use of SCDs and measures to take at home to prevent DVT including frequent mobility.  Reportable signs and symptoms of DVT discussed. Post-operative instructions discussed and expectations for after surgery. Incisional care discussed as well including reportable signs and symptoms including erythema, drainage, wound separation.     5 minutes spent with the patient.  Verbalizing understanding of material discussed. No needs or concerns voiced at the end of the visit.   Advised patient to call for any needs.  Advised that her post-operative medications had been prescribed and could be picked up at any time.    This appointment is included in the global surgical bundle as pre-operative teaching and has no charge.

## 2022-03-14 NOTE — Progress Notes (Signed)
Anesthesia Chart Review   Case: [id number] Date/Time: 03/21/22 1046   Procedure: XI ROBOTIC ASSISTED TOTAL HYSTERECTOMY WITH BILATERAL SALPINGO OOPHORECTOMY (Bilateral)   Anesthesia type: General   Pre-op diagnosis: ENDOMETRIAL CANCER   Location: [facility] ROOM 05 / [facility]   Surgeons: [name], MD       DISCUSSION:66 y.o. former smoker with h/o DM II insulin dependent, CHF, CAD, HTN, hypothyroidism, endometrial cancer scheduled for above procedure 03/21/2022 with Dr. Jeral Pinch.   Port in place. Last dose of chemo 01/09/22, last radiation 02/04/22.   Per cardiology preoperative evaluation 02/01/2022, "Chart reviewed as part of pre-operative protocol coverage. Per Dr. Aundra Dubin, primary cardiologist, Terri Heath would be at acceptable risk for the planned procedure without further cardiovascular testing.    Additionally, per Dr. Aundra Dubin, patient may hold aspirin for 5 to 7 days prior to procedure. Please resume aspirin as soon as possible postprocedure, at the discretion of the surgeon."  Anticipate pt can proceed with planned procedure barring acute status change.   VS: BP 125/70   Pulse 96   Temp 36.6 C (Oral)   Resp 16   Ht _0  (1.702 m)   Wt 130.7 kg   SpO2 100%   BMI 45.15 kg/m   PROVIDERS: Early, Coralee Pesa, NP is PCP   Primary Cardiologist: Larae Grooms, MD LABS: Labs reviewed: Acceptable for surgery. (all labs ordered are listed, but only abnormal results are displayed)  Labs Reviewed  COMPREHENSIVE METABOLIC PANEL - Abnormal; Notable for the following components:      Result Value   Glucose, Bld 199 (*)    Albumin 3.4 (*)    All other components within normal limits  HEMOGLOBIN A1C - Abnormal; Notable for the following components:   Hgb A1c MFr Bld 8.4 (*)    All other components within normal limits  GLUCOSE, CAPILLARY - Abnormal; Notable for the following components:   Glucose-Capillary 190 (*)    All other components within normal limits  SURGICAL  PCR SCREEN  TYPE AND SCREEN     IMAGES:   EKG: 01/12/2022 Rate 84 bpm  Normal sinus rhythm Low voltage QRS Inferior infarct , age undetermined Cannot rule out Anterior infarct (cited on or before 23-Jun-2021) Abnormal ECG When compared with ECG of 23-Jun-2021 10:13, No significant change was found No significant change since last tracing  CV: Echo 06/23/2021 1. Left ventricular ejection fraction, by estimation, is 50%. The left  ventricle has mildly decreased function. The left ventricle demonstrates  regional wall motion abnormalities, there appeared to be inferolateral  hypokinesis. Left ventricular  diastolic parameters are consistent with Grade I diastolic dysfunction  (impaired relaxation).   2. Right ventricular systolic function is normal. The right ventricular  size is normal. There is normal pulmonary artery systolic pressure. The  estimated right ventricular systolic pressure is 24.2 mmHg.   3. The mitral valve is normal in structure. No evidence of mitral valve  regurgitation. No evidence of mitral stenosis.   4. The aortic valve was not well visualized. Aortic valve regurgitation  is not visualized. No aortic stenosis is present.   5. The inferior vena cava is normal in size with greater than 50%  respiratory variability, suggesting right atrial pressure of 3 mmHg.   6. Technically difficult study. Would use Definity if she has an echo in  the future.  Past Medical History:  Diagnosis Date   Acute systolic heart failure (HCC)    Allergy    Asthma  Bilateral shoulder pain    Borderline hypertension    Bronchitis    CAD (coronary artery disease)    Chronic systolic CHF (congestive heart failure) (HCC)    Chronic systolic congestive heart failure, NYHA class 3 ([place]) 07/24/2018   Diabetes mellitus    Diagnosed in 2000, on insulin, Trulicity and metformin, not checking cgs at home   Gangrenous appendicitis with perforation s/p lap appendectomy 10/04/2017  10/04/2017   GERD (gastroesophageal reflux disease)    History of MI (myocardial infarction)    History of radiation therapy    Uterus- 12/14/21-02/01/22- Dr. Gery Pray   Hyperlipidemia    Hypertension    Hypothyroidism    MGUS (monoclonal gammopathy of unknown significance)    Mitral regurgitation    Myocardial infarction ([place])    Neuropathy    Obesity    Sleep apnea    uses CPAP   Statin intolerance    Uterine cancer [facility])     Past Surgical History:  Procedure Laterality Date   BREATH TEK H PYLORI  09/18/2011   Procedure: BREATH TEK H PYLORI;  Surgeon: Shann Medal, MD;  Location: [facility];  Service: General;  Laterality: N/A;   CESAREAN SECTION     x 3   IR IMAGING GUIDED PORT INSERTION  12/07/2021   LAPAROSCOPIC APPENDECTOMY N/A 10/03/2017   Procedure: APPENDECTOMY LAPAROSCOPIC;  Surgeon: Leighton Ruff, MD;  Location: [facility];  Service: General;  Laterality: N/A;   OPERATIVE ULTRASOUND N/A 02/01/2022   Procedure: OPERATIVE ULTRASOUND;  Surgeon: Gery Pray, MD;  Location: [facility];  Service: Urology;  Laterality: N/A;   RIGHT/LEFT HEART CATH AND CORONARY ANGIOGRAPHY N/A 02/12/2018   Procedure: RIGHT/LEFT HEART CATH AND CORONARY ANGIOGRAPHY;  Surgeon: Jettie Booze, MD;  Location: [facility] [place] [facility];  Service: Cardiovascular;  Laterality: N/A;   TANDEM RING INSERTION N/A 02/01/2022   Procedure: TANDEM RING INSERTION;  Surgeon: Gery Pray, MD;  Location: [facility];  Service: Urology;  Laterality: N/A;   TUBAL LIGATION      MEDICATIONS:  acetaminophen (TYLENOL) 500 MG tablet   albuterol (PROAIR HFA) 108 (90 Base) MCG/ACT inhaler   aspirin 81 MG chewable tablet   blood glucose meter kit and supplies   carvedilol (COREG) 6.25 MG tablet   diphenhydrAMINE (BENADRYL) 25 MG tablet   diphenoxylate-atropine (LOMOTIL) 2.5-0.025 MG tablet   Dulaglutide (TRULICITY) 3 JK/0.9FG SOPN   Evolocumab (REPATHA SURECLICK) 182 MG/ML SOAJ   furosemide (LASIX) 20 MG tablet   glucose  blood test strip   insulin glargine (LANTUS) 100 UNIT/ML Solostar Pen   Insulin Pen Needle (UNIFINE PENTIPS) 32G X 4 MM MISC   levothyroxine (SYNTHROID) 50 MCG tablet   lidocaine-prilocaine (EMLA) cream   loperamide (IMODIUM) 2 MG capsule   Magnesium 250 MG TABS   magnesium oxide (MAG-OX) 400 (240 Mg) MG tablet   metFORMIN (GLUCOPHAGE) 1000 MG tablet   Multiple Vitamin (MULITIVITAMIN WITH MINERALS) TABS   NON FORMULARY   ondansetron (ZOFRAN) 8 MG tablet   OneTouch Delica Lancets 99B MISC   prochlorperazine (COMPAZINE) 10 MG tablet   sacubitril-valsartan (ENTRESTO) 49-51 MG   senna-docusate (SENOKOT-S) 8.6-50 MG tablet   spironolactone (ALDACTONE) 25 MG tablet   traMADol (ULTRAM) 50 MG tablet   triamcinolone ointment (KENALOG) 0.1 %   No current facility-administered medications for this encounter.   Konrad Felix Ward, PA-C [facility] [phone]

## 2022-03-20 ENCOUNTER — Telehealth: Payer: Self-pay

## 2022-03-20 NOTE — Telephone Encounter (Signed)
Telephone call to check on pre-operative status.  Patient compliant with pre-operative instructions.  Reinforced nothing to eat after midnight. Clear liquids until 0800. Patient to arrive at [facility].  No questions or concerns voiced.  Instructed to call for any needs.

## 2022-03-21 ENCOUNTER — Ambulatory Visit (HOSPITAL_COMMUNITY): Payer: Medicare HMO | Admitting: Physician Assistant

## 2022-03-21 ENCOUNTER — Ambulatory Visit (HOSPITAL_COMMUNITY)
Admission: RE | Admit: 2022-03-21 | Discharge: 2022-03-21 | Disposition: A | Discharge status: Home / Self Care | Payer: Medicare HMO | Attending: Gynecologic Oncology | Admitting: Gynecologic Oncology

## 2022-03-21 ENCOUNTER — Ambulatory Visit (HOSPITAL_BASED_OUTPATIENT_CLINIC_OR_DEPARTMENT_OTHER): Payer: Medicare HMO | Admitting: Anesthesiology

## 2022-03-21 ENCOUNTER — Other Ambulatory Visit: Payer: Self-pay

## 2022-03-21 ENCOUNTER — Encounter (HOSPITAL_COMMUNITY)
Admission: RE | Disposition: A | Discharge status: Home / Self Care | Payer: Self-pay | Source: Home / Self Care | Attending: Gynecologic Oncology

## 2022-03-21 ENCOUNTER — Encounter (HOSPITAL_COMMUNITY): Payer: Self-pay | Admitting: Gynecologic Oncology

## 2022-03-21 DIAGNOSIS — E039 Hypothyroidism, unspecified: Secondary | ICD-10-CM | POA: Insufficient documentation

## 2022-03-21 DIAGNOSIS — N72 Inflammatory disease of cervix uteri: Secondary | ICD-10-CM | POA: Diagnosis not present

## 2022-03-21 DIAGNOSIS — I2510 Atherosclerotic heart disease of native coronary artery without angina pectoris: Secondary | ICD-10-CM | POA: Diagnosis not present

## 2022-03-21 DIAGNOSIS — N879 Dysplasia of cervix uteri, unspecified: Secondary | ICD-10-CM | POA: Diagnosis not present

## 2022-03-21 DIAGNOSIS — N8003 Adenomyosis of the uterus: Secondary | ICD-10-CM | POA: Insufficient documentation

## 2022-03-21 DIAGNOSIS — N888 Other specified noninflammatory disorders of cervix uteri: Secondary | ICD-10-CM | POA: Diagnosis not present

## 2022-03-21 DIAGNOSIS — E1165 Type 2 diabetes mellitus with hyperglycemia: Secondary | ICD-10-CM

## 2022-03-21 DIAGNOSIS — I110 Hypertensive heart disease with heart failure: Secondary | ICD-10-CM

## 2022-03-21 DIAGNOSIS — C541 Malignant neoplasm of endometrium: Secondary | ICD-10-CM | POA: Diagnosis not present

## 2022-03-21 DIAGNOSIS — K660 Peritoneal adhesions (postprocedural) (postinfection): Secondary | ICD-10-CM

## 2022-03-21 DIAGNOSIS — J45909 Unspecified asthma, uncomplicated: Secondary | ICD-10-CM | POA: Diagnosis not present

## 2022-03-21 DIAGNOSIS — N9489 Other specified conditions associated with female genital organs and menstrual cycle: Secondary | ICD-10-CM | POA: Diagnosis not present

## 2022-03-21 DIAGNOSIS — E119 Type 2 diabetes mellitus without complications: Secondary | ICD-10-CM | POA: Insufficient documentation

## 2022-03-21 DIAGNOSIS — N3289 Other specified disorders of bladder: Secondary | ICD-10-CM | POA: Diagnosis not present

## 2022-03-21 DIAGNOSIS — I5022 Chronic systolic (congestive) heart failure: Secondary | ICD-10-CM

## 2022-03-21 DIAGNOSIS — I252 Old myocardial infarction: Secondary | ICD-10-CM

## 2022-03-21 DIAGNOSIS — Z87891 Personal history of nicotine dependence: Secondary | ICD-10-CM | POA: Diagnosis not present

## 2022-03-21 DIAGNOSIS — I509 Heart failure, unspecified: Secondary | ICD-10-CM | POA: Diagnosis not present

## 2022-03-21 DIAGNOSIS — Z6841 Body Mass Index (BMI) 40.0 and over, adult: Secondary | ICD-10-CM

## 2022-03-21 HISTORY — PX: ROBOTIC ASSISTED TOTAL HYSTERECTOMY WITH BILATERAL SALPINGO OOPHERECTOMY: SHX6086

## 2022-03-21 LAB — GLUCOSE, CAPILLARY
Glucose-Capillary: 200 mg/dL — ABNORMAL HIGH (ref 70–99)
Glucose-Capillary: 227 mg/dL — ABNORMAL HIGH (ref 70–99)

## 2022-03-21 LAB — ABO/RH: ABO/RH(D): O POS

## 2022-03-21 SURGERY — HYSTERECTOMY, TOTAL, ROBOT-ASSISTED, LAPAROSCOPIC, WITH BILATERAL SALPINGO-OOPHORECTOMY
Anesthesia: General | Laterality: Bilateral

## 2022-03-21 MED ORDER — MIDAZOLAM HCL 2 MG/2ML IJ SOLN
INTRAMUSCULAR | Status: AC
  Filled 2022-03-21: qty 2

## 2022-03-21 MED ORDER — HEPARIN SODIUM (PORCINE) 5000 UNIT/ML IJ SOLN
5000.0000 [IU] | INTRAMUSCULAR | Status: AC
  Administered 2022-03-21: 5000 [IU] via SUBCUTANEOUS
  Filled 2022-03-21: qty 1

## 2022-03-21 MED ORDER — PROPOFOL 10 MG/ML IV BOLUS
INTRAVENOUS | Status: AC
  Filled 2022-03-21: qty 20

## 2022-03-21 MED ORDER — BUPIVACAINE HCL 0.25 % IJ SOLN
INTRAMUSCULAR | Status: DC | PRN
  Administered 2022-03-21: 50 mL

## 2022-03-21 MED ORDER — LIDOCAINE HCL (PF) 2 % IJ SOLN
INTRAMUSCULAR | Status: AC
  Filled 2022-03-21: qty 5

## 2022-03-21 MED ORDER — ONDANSETRON HCL 4 MG/2ML IJ SOLN
INTRAMUSCULAR | Status: AC
  Filled 2022-03-21: qty 2

## 2022-03-21 MED ORDER — LACTATED RINGERS IV SOLN: INTRAVENOUS | Status: DC | PRN

## 2022-03-21 MED ORDER — ORAL CARE MOUTH RINSE: 15.0000 mL | Freq: Once | OROMUCOSAL | Status: AC

## 2022-03-21 MED ORDER — OXYCODONE HCL 5 MG/5ML PO SOLN: 5.0000 mg | Freq: Once | ORAL | Status: AC | PRN

## 2022-03-21 MED ORDER — LACTATED RINGERS IR SOLN
Status: DC | PRN
  Administered 2022-03-21: 1000 mL

## 2022-03-21 MED ORDER — DEXAMETHASONE SODIUM PHOSPHATE 10 MG/ML IJ SOLN
INTRAMUSCULAR | Status: AC
  Filled 2022-03-21: qty 1

## 2022-03-21 MED ORDER — ACETAMINOPHEN 500 MG PO TABS
1000.0000 mg | ORAL_TABLET | ORAL | Status: DC
  Filled 2022-03-21: qty 2

## 2022-03-21 MED ORDER — LIDOCAINE HCL (PF) 2 % IJ SOLN
INTRAMUSCULAR | Status: DC | PRN
  Administered 2022-03-21: 1.5 mg/kg/h via INTRADERMAL

## 2022-03-21 MED ORDER — BUPIVACAINE HCL 0.25 % IJ SOLN
INTRAMUSCULAR | Status: AC
Start: 2022-03-21 — End: ?
  Filled 2022-03-21: qty 1

## 2022-03-21 MED ORDER — ROCURONIUM BROMIDE 100 MG/10ML IV SOLN
INTRAVENOUS | Status: DC | PRN
  Administered 2022-03-21 (×2): 50 mg via INTRAVENOUS

## 2022-03-21 MED ORDER — FENTANYL CITRATE PF 50 MCG/ML IJ SOSY
PREFILLED_SYRINGE | INTRAMUSCULAR | Status: AC
  Filled 2022-03-21: qty 1

## 2022-03-21 MED ORDER — FENTANYL CITRATE (PF) 100 MCG/2ML IJ SOLN
INTRAMUSCULAR | Status: AC
  Filled 2022-03-21: qty 2

## 2022-03-21 MED ORDER — CHLORHEXIDINE GLUCONATE 0.12 % MT SOLN
15.0000 mL | Freq: Once | OROMUCOSAL | Status: AC
  Administered 2022-03-21: 15 mL via OROMUCOSAL

## 2022-03-21 MED ORDER — LACTATED RINGERS IV SOLN: INTRAVENOUS | Status: DC

## 2022-03-21 MED ORDER — LABETALOL HCL 5 MG/ML IV SOLN
INTRAVENOUS | Status: DC | PRN
  Administered 2022-03-21 (×2): 5 mg via INTRAVENOUS

## 2022-03-21 MED ORDER — DEXAMETHASONE SODIUM PHOSPHATE 4 MG/ML IJ SOLN
4.0000 mg | INTRAMUSCULAR | Status: AC
  Administered 2022-03-21: 4 mg via INTRAVENOUS

## 2022-03-21 MED ORDER — FENTANYL CITRATE (PF) 250 MCG/5ML IJ SOLN
INTRAMUSCULAR | Status: AC
  Filled 2022-03-21: qty 5

## 2022-03-21 MED ORDER — CEFAZOLIN IN SODIUM CHLORIDE 3-0.9 GM/100ML-% IV SOLN
3.0000 g | INTRAVENOUS | Status: AC
  Administered 2022-03-21: 3 g via INTRAVENOUS
  Filled 2022-03-21: qty 100

## 2022-03-21 MED ORDER — SUGAMMADEX SODIUM 500 MG/5ML IV SOLN
INTRAVENOUS | Status: DC | PRN
  Administered 2022-03-21: 400 mg via INTRAVENOUS

## 2022-03-21 MED ORDER — PHENYLEPHRINE HCL (PRESSORS) 10 MG/ML IV SOLN
INTRAVENOUS | Status: DC | PRN
  Administered 2022-03-21 (×4): 160 ug via INTRAVENOUS

## 2022-03-21 MED ORDER — PROPOFOL 10 MG/ML IV BOLUS
INTRAVENOUS | Status: DC | PRN
  Administered 2022-03-21: 150 mg via INTRAVENOUS

## 2022-03-21 MED ORDER — ROCURONIUM BROMIDE 10 MG/ML (PF) SYRINGE
PREFILLED_SYRINGE | INTRAVENOUS | Status: AC
  Filled 2022-03-21: qty 10

## 2022-03-21 MED ORDER — MIDAZOLAM HCL 5 MG/5ML IJ SOLN
INTRAMUSCULAR | Status: DC | PRN
  Administered 2022-03-21: 1 mg via INTRAVENOUS

## 2022-03-21 MED ORDER — OXYCODONE HCL 5 MG PO TABS
ORAL_TABLET | ORAL | Status: AC
  Filled 2022-03-21: qty 1

## 2022-03-21 MED ORDER — LIDOCAINE HCL (PF) 2 % IJ SOLN
INTRAMUSCULAR | Status: AC
  Filled 2022-03-21: qty 10

## 2022-03-21 MED ORDER — FENTANYL CITRATE (PF) 100 MCG/2ML IJ SOLN
INTRAMUSCULAR | Status: DC | PRN
  Administered 2022-03-21 (×3): 50 ug via INTRAVENOUS
  Administered 2022-03-21 (×2): 100 ug via INTRAVENOUS
  Administered 2022-03-21: 50 ug via INTRAVENOUS

## 2022-03-21 MED ORDER — OXYCODONE HCL 5 MG PO TABS
5.0000 mg | ORAL_TABLET | Freq: Once | ORAL | Status: AC | PRN
  Administered 2022-03-21: 5 mg via ORAL

## 2022-03-21 MED ORDER — PHENYLEPHRINE 80 MCG/ML (10ML) SYRINGE FOR IV PUSH (FOR BLOOD PRESSURE SUPPORT)
PREFILLED_SYRINGE | INTRAVENOUS | Status: AC
Start: 2022-03-21 — End: ?
  Filled 2022-03-21: qty 20

## 2022-03-21 MED ORDER — SUCCINYLCHOLINE CHLORIDE 200 MG/10ML IV SOSY
PREFILLED_SYRINGE | INTRAVENOUS | Status: DC | PRN
  Administered 2022-03-21: 160 mg via INTRAVENOUS

## 2022-03-21 MED ORDER — LIDOCAINE HCL (CARDIAC) PF 100 MG/5ML IV SOSY
PREFILLED_SYRINGE | INTRAVENOUS | Status: DC | PRN
  Administered 2022-03-21: 60 mg via INTRAVENOUS

## 2022-03-21 MED ORDER — FENTANYL CITRATE PF 50 MCG/ML IJ SOSY: 25.0000 ug | PREFILLED_SYRINGE | INTRAMUSCULAR | Status: DC | PRN

## 2022-03-21 MED ORDER — ONDANSETRON HCL 4 MG/2ML IJ SOLN
INTRAMUSCULAR | Status: DC | PRN
  Administered 2022-03-21: 4 mg via INTRAVENOUS

## 2022-03-21 MED ORDER — SUCCINYLCHOLINE CHLORIDE 200 MG/10ML IV SOSY
PREFILLED_SYRINGE | INTRAVENOUS | Status: AC
  Filled 2022-03-21: qty 10

## 2022-03-21 MED ORDER — STERILE WATER FOR IRRIGATION IR SOLN
Status: DC | PRN
  Administered 2022-03-21: 1000 mL

## 2022-03-21 MED ORDER — ONDANSETRON HCL 4 MG/2ML IJ SOLN: 4.0000 mg | Freq: Four times a day (QID) | INTRAMUSCULAR | Status: DC | PRN

## 2022-03-21 MED ORDER — LABETALOL HCL 5 MG/ML IV SOLN
INTRAVENOUS | Status: AC
  Filled 2022-03-21: qty 4

## 2022-03-21 SURGICAL SUPPLY — 78 items
ADH SKN CLS APL DERMABOND .7 (GAUZE/BANDAGES/DRESSINGS) ×1
AGENT HMST KT MTR STRL THRMB (HEMOSTASIS)
APL ESCP 34 STRL LF DISP (HEMOSTASIS)
APPLICATOR SURGIFLO ENDO (HEMOSTASIS) IMPLANT
BAG LAPAROSCOPIC 12 15 PORT 16 (BASKET) IMPLANT
BAG RETRIEVAL 12/15 (BASKET)
BLADE SURG SZ10 CARB STEEL (BLADE) IMPLANT
COVER BACK TABLE 60X90IN (DRAPES) ×2 IMPLANT
COVER TIP SHEARS 8 DVNC (MISCELLANEOUS) ×1 IMPLANT
COVER TIP SHEARS 8MM DA VINCI (MISCELLANEOUS) ×2
DERMABOND ADVANCED (GAUZE/BANDAGES/DRESSINGS) ×1
DERMABOND ADVANCED .7 DNX12 (GAUZE/BANDAGES/DRESSINGS) ×1 IMPLANT
DRAPE ARM DVNC X/XI (DISPOSABLE) ×4 IMPLANT
DRAPE COLUMN DVNC XI (DISPOSABLE) ×1 IMPLANT
DRAPE DA VINCI XI ARM (DISPOSABLE) ×8
DRAPE DA VINCI XI COLUMN (DISPOSABLE) ×2
DRAPE SHEET LG 3/4 BI-LAMINATE (DRAPES) ×2 IMPLANT
DRAPE SURG IRRIG POUCH 19X23 (DRAPES) ×2 IMPLANT
DRSG OPSITE POSTOP 4X6 (GAUZE/BANDAGES/DRESSINGS) IMPLANT
DRSG OPSITE POSTOP 4X8 (GAUZE/BANDAGES/DRESSINGS) IMPLANT
ELECT PENCIL ROCKER SW 15FT (MISCELLANEOUS) IMPLANT
ELECT REM PT RETURN 15FT ADLT (MISCELLANEOUS) ×2 IMPLANT
GAUZE 4X4 16PLY ~~LOC~~+RFID DBL (SPONGE) ×4 IMPLANT
GLOVE BIO SURGEON STRL SZ 6 (GLOVE) ×8 IMPLANT
GLOVE BIO SURGEON STRL SZ 6.5 (GLOVE) ×1 IMPLANT
GOWN STRL REUS W/ TWL LRG LVL3 (GOWN DISPOSABLE) ×4 IMPLANT
GOWN STRL REUS W/TWL LRG LVL3 (GOWN DISPOSABLE) ×8
HOLDER FOLEY CATH W/STRAP (MISCELLANEOUS) IMPLANT
IRRIG SUCT STRYKERFLOW 2 WTIP (MISCELLANEOUS) ×2
IRRIGATION SUCT STRKRFLW 2 WTP (MISCELLANEOUS) ×1 IMPLANT
KIT PROCEDURE DA VINCI SI (MISCELLANEOUS)
KIT PROCEDURE DVNC SI (MISCELLANEOUS) IMPLANT
KIT TURNOVER KIT A (KITS) IMPLANT
LIGASURE IMPACT 36 18CM CVD LR (INSTRUMENTS) IMPLANT
MANIPULATOR ADVINCU DEL 3.0 PL (MISCELLANEOUS) ×1 IMPLANT
MANIPULATOR ADVINCU DEL 3.5 PL (MISCELLANEOUS) IMPLANT
MANIPULATOR UTERINE 4.5 ZUMI (MISCELLANEOUS) IMPLANT
NDL HYPO 21X1.5 SAFETY (NEEDLE) ×1 IMPLANT
NDL SPNL 18GX3.5 QUINCKE PK (NEEDLE) IMPLANT
NEEDLE HYPO 21X1.5 SAFETY (NEEDLE) ×2 IMPLANT
NEEDLE SPNL 18GX3.5 QUINCKE PK (NEEDLE) IMPLANT
OBTURATOR OPTICAL STANDARD 8MM (TROCAR) ×2
OBTURATOR OPTICAL STND 8 DVNC (TROCAR) ×1
OBTURATOR OPTICALSTD 8 DVNC (TROCAR) ×1 IMPLANT
PACK ROBOT GYN CUSTOM WL (TRAY / TRAY PROCEDURE) ×2 IMPLANT
PAD POSITIONING PINK XL (MISCELLANEOUS) ×2 IMPLANT
PORT ACCESS TROCAR AIRSEAL 12 (TROCAR) ×1 IMPLANT
PORT ACCESS TROCAR AIRSEAL 5M (TROCAR) ×1
RETRACTOR LAPSCP 12X46 CVD (ENDOMECHANICALS) IMPLANT
RTRCTR LAPSCP 12X46 CVD (ENDOMECHANICALS) ×2
SCRUB CHG 4% DYNA-HEX 4OZ (MISCELLANEOUS) ×4 IMPLANT
SEAL CANN UNIV 5-8 DVNC XI (MISCELLANEOUS) ×4 IMPLANT
SEAL XI 5MM-8MM UNIVERSAL (MISCELLANEOUS) ×8
SET TRI-LUMEN FLTR TB AIRSEAL (TUBING) ×2 IMPLANT
SPIKE FLUID TRANSFER (MISCELLANEOUS) ×2 IMPLANT
SPONGE T-LAP 18X18 ~~LOC~~+RFID (SPONGE) IMPLANT
SURGIFLO W/THROMBIN 8M KIT (HEMOSTASIS) IMPLANT
SUT MNCRL AB 4-0 PS2 18 (SUTURE) IMPLANT
SUT PDS AB 1 TP1 96 (SUTURE) IMPLANT
SUT VIC AB 0 CT1 27 (SUTURE)
SUT VIC AB 0 CT1 27XBRD ANTBC (SUTURE) IMPLANT
SUT VIC AB 2-0 CT1 27 (SUTURE)
SUT VIC AB 2-0 CT1 TAPERPNT 27 (SUTURE) IMPLANT
SUT VIC AB 4-0 PS2 18 (SUTURE) ×5 IMPLANT
SUT VLOC 180 0 9IN  GS21 (SUTURE) ×4
SUT VLOC 180 0 9IN GS21 (SUTURE) IMPLANT
SYR 10ML LL (SYRINGE) IMPLANT
SYS BAG RETRIEVAL 10MM (BASKET)
SYS WOUND ALEXIS 18CM MED (MISCELLANEOUS)
SYSTEM BAG RETRIEVAL 10MM (BASKET) IMPLANT
SYSTEM WOUND ALEXIS 18CM MED (MISCELLANEOUS) IMPLANT
TOWEL OR NON WOVEN STRL DISP B (DISPOSABLE) IMPLANT
TRAP SPECIMEN MUCUS 40CC (MISCELLANEOUS) IMPLANT
TRAY FOLEY MTR SLVR 16FR STAT (SET/KITS/TRAYS/PACK) ×2 IMPLANT
TROCAR Z-THREAD FIOS 5X100MM (TROCAR) IMPLANT
UNDERPAD 30X36 HEAVY ABSORB (UNDERPADS AND DIAPERS) ×4 IMPLANT
WATER STERILE IRR 1000ML POUR (IV SOLUTION) ×2 IMPLANT
YANKAUER SUCT BULB TIP 10FT TU (MISCELLANEOUS) IMPLANT

## 2022-03-21 NOTE — Interval H&P Note (Signed)
History and Physical Interval Note:  03/21/2022 11:11 AM  Terri Heath  has presented today for surgery, with the diagnosis of ENDOMETRIAL CANCER.  The various methods of treatment have been discussed with the patient and family. After consideration of risks, benefits and other options for treatment, the patient has consented to  Procedure(s): XI ROBOTIC ASSISTED TOTAL HYSTERECTOMY WITH BILATERAL SALPINGO OOPHORECTOMY (Bilateral) as a surgical intervention.  The patient's history has been reviewed, patient examined, no change in status, stable for surgery.  I have reviewed the patient's chart and labs.  Questions were answered to the patient's satisfaction.     [name]

## 2022-03-21 NOTE — Op Note (Addendum)
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 Obesity made retroperitoneal visualization limited and increased the complexity of the case and necessitated additional instrumentation for retraction. Obesity related complexity increased the duration of the procedure by 45 minutes.    Surgeon: Eugene Garnet MD  Assistant Surgeon: Antionette Char MD (an MD assistant was necessary for tissue manipulation, management of robotic instrumentation, retraction and positioning due to the complexity of the case and hospital policies).   Anesthesia: GET  Urine Output: 250cc  Operative Findings:  On EUA, 10 cm mobile uterus. On intra-abdominal entry, normal upper abdominal survey.  Omentum densely adherent to anterior abdominal wall along prior midline infraumbilical incision.  Normal-appearing small and large bowel.  Difficult intra-abdominal adiposity.  Cecum somewhat adherent to the right abdominal wall secondary to prior appendectomy.  Uterus approximately 8-10 cm and somewhat bulbous.  Normal-appearing bilateral adnexa.  No obvious adenopathy.  Some edema of the retroperitoneum.  Densely adherent bladder to the uterus and cervix from prior cesarean sections.  No gross evidence of disease. On cystoscopy, intact bladder dome. Good efflux from bilateral ureteral orifices.  Estimated Blood Loss:  100cc      Total IV Fluids: see I&O flowsheet         Specimens: uterus, cervix, bilateral tubes and ovaries         Complications:  None apparent; patient tolerated the procedure well.         Disposition: PACU - hemodynamically  stable.  Procedure Details  The patient was seen in the Holding Room. The risks, benefits, complications, treatment options, and expected outcomes were discussed with the patient.  The patient concurred with the proposed plan, giving informed consent.  The site of surgery properly noted/marked. The patient was identified as Grayci Kelemen and the procedure verified as a Robotic-assisted hysterectomy with bilateral salpingo oophorectomy.   After induction of anesthesia, the patient was draped and prepped in the usual sterile manner. Patient was placed in supine position after anesthesia and draped and prepped in the usual sterile manner as follows: Her arms were tucked to her side with all appropriate precautions.  The shoulders were stabilized with padded shoulder blocks applied to the acromium processes.  The patient was placed in the semi-lithotomy position in [place] stirrups. The sides of her pannus were taped to her legs to prevent significant panus movement during Trendelenburg.  The perineum and vagina were prepped with CholoraPrep. The patient was draped after the CholoraPrep had been allowed to dry for 3 minutes.  A Time Out was held and the above information confirmed.  The urethra was prepped with Betadine. Foley catheter was placed.  A sterile speculum was placed in the vagina.  The cervix was grasped with a single-tooth tenaculum. The cervix was dilated with Shawnie Pons dilators.  The 3.0 Delineator uterine manipulator with a colpotomizer ring was placed without difficulty.  A pneum occluder balloon was placed over the manipulator.  OG tube placement was confirmed and to suction.   Next, a 10 mm skin incision was made 1 cm below the subcostal margin in the midclavicular line.  The 5 mm Optiview port and scope was used for direct entry.  Opening pressure was under 10 mm CO2.  The  abdomen was insufflated and the findings were noted as above.   At this point and all points during the procedure, the patient's  intra-abdominal pressure did not exceed 15 mmHg. Next, an 8 mm skin incision was made superior to the umbilicus and a right and left port were placed about 8 cm lateral to the robot port on the right and left side.  A fourth arm was placed on the right.  The 5 mm assist trocar was exchanged for a 10-12 mm port. All ports were placed under direct visualization.  The patient was placed in steep Trendelenburg.  The robot was docked in the normal manner.  Attention was turned to lysis of omental adhesions from the anterior abdominal wall.  This was done with a combination of blunt dissection, monopolar and bipolar electrocautery.  Once freed, omentum was placed in the upper abdomen along with the small bowel.  The cecum was mobilized some off of the right side well.  The right and left peritoneum were opened parallel to the IP ligament to open the retroperitoneal spaces bilaterally. The round ligaments were transected. The ureter was noted to be on the medial leaf of the broad ligament.  The peritoneum above the ureter was incised and stretched and the infundibulopelvic ligament was skeletonized, cauterized and cut.    The posterior peritoneum was taken down to the level of the KOH ring.  The anterior peritoneum was also taken down.  The bladder flap was created to the level of the KOH ring.  This was quite challenging secondary to adhesions.  The bladder required being backfilled at 1 point to help delineate where the bladder was.  Not only was the bladder adherent to the lower aspect of the uterus and cervix but also pulled up bilaterally over the uterine vessels.  Combination of blunt dissection, sharp dissection, and short burst of electrocautery was used, ultimately dropping the bladder below the level of the KOH ring.  The uterine artery on the right side was skeletonized, cauterized and cut in the normal manner.  A similar procedure was performed on the left.  The colpotomy was made and the uterus, cervix,  bilateral ovaries and tubes were amputated and delivered through the vagina.  Pedicles were inspected and excellent hemostasis was achieved.    The colpotomy at the vaginal cuff was closed with a figure-of-eight using Vicryl on a CT1 at each apex.  The vaginal cuff was then closed with 0 V-Loc in running fashion.  Peritoneum was closed over the cuff closure with 2 figure-of-eight sutures using 0 Vicryl.  Irrigation was used and excellent hemostasis was achieved.    Cystoscopy was performed with findings above. The bladder was then emptied.   At this point in the procedure was completed.  Robotic instruments were removed under direct visulaization.  The robot was undocked. The fascia at the 10-12 mm port was closed with 0 Vicryl on a UR-5 needle.  The subcuticular tissue was closed with 4-0 Vicryl and the skin was closed with 4-0 Monocryl in a subcuticular manner.  Dermabond was applied.    The vagina was swabbed with  minimal bleeding noted. Foley catheter was removed.  All sponge, lap and needle counts were correct x  3.   The patient was transferred to the recovery room in stable condition.  Eugene Garnet, MD

## 2022-03-21 NOTE — Discharge Instructions (Signed)

## 2022-03-21 NOTE — Anesthesia Preprocedure Evaluation (Signed)
Anesthesia Evaluation  Patient identified by MRN, date of birth, ID band Patient awake    Reviewed: Allergy & Precautions, H&P , NPO status , Patient's Chart, lab work & pertinent test results  Airway Mallampati: II   Neck ROM: full    Dental   Pulmonary shortness of breath, asthma , sleep apnea , former smoker,    breath sounds clear to auscultation       Cardiovascular hypertension, + CAD, + Past MI and +CHF   Rhythm:regular Rate:Normal     Neuro/Psych  Neuromuscular disease    GI/Hepatic GERD  ,  Endo/Other  diabetes, Type 2Hypothyroidism   Renal/GU      Musculoskeletal   Abdominal   Peds  Hematology   Anesthesia Other Findings   Reproductive/Obstetrics                             Anesthesia Physical Anesthesia Plan  ASA: 3  Anesthesia Plan: General   Post-op Pain Management:    Induction: Intravenous  PONV Risk Score and Plan: 3 and Ondansetron, Dexamethasone, Midazolam and Treatment may vary due to age or medical condition  Airway Management Planned: Oral ETT  Additional Equipment:   Intra-op Plan:   Post-operative Plan: Extubation in OR  Informed Consent: I have reviewed the patients History and Physical, chart, labs and discussed the procedure including the risks, benefits and alternatives for the proposed anesthesia with the patient or authorized representative who has indicated his/her understanding and acceptance.     Dental advisory given  Plan Discussed with: CRNA, Anesthesiologist and Surgeon  Anesthesia Plan Comments:         Anesthesia Quick Evaluation

## 2022-03-21 NOTE — Anesthesia Procedure Notes (Signed)
Procedure Name: Intubation Date/Time: 03/21/2022 12:02 PM  Performed by: Jonna Munro, CRNAPre-anesthesia Checklist: Patient identified, Emergency Drugs available, Suction available, Patient being monitored and Timeout performed Patient Re-evaluated:Patient Re-evaluated prior to induction Oxygen Delivery Method: Circle system utilized Preoxygenation: Pre-oxygenation with 100% oxygen Induction Type: IV induction, Rapid sequence and Cricoid Pressure applied Laryngoscope Size: Mac and 3 Grade View: Grade I Tube type: Oral Tube size: 7.0 mm Number of attempts: 1 Airway Equipment and Method: Stylet Secured at: 23 cm Tube secured with: Tape Dental Injury: Teeth and Oropharynx as per pre-operative assessment

## 2022-03-21 NOTE — Transfer of Care (Signed)
Immediate Anesthesia Transfer of Care Note  Patient: Terri Heath  Procedure(s) Performed: XI ROBOTIC ASSISTED TOTAL HYSTERECTOMY WITH BILATERAL SALPINGO OOPHORECTOMY, LYSIS OF ADHESIONS, CYSTOSCOPY (Bilateral)  Patient Location: PACU  Anesthesia Type:General  Level of Consciousness: awake and patient cooperative  Airway & Oxygen Therapy: Patient Spontanous Breathing and Patient connected to face mask oxygen  Post-op Assessment: Report given to RN, Post -op Vital signs reviewed and stable and Patient moving all extremities X 4  Post vital signs: Reviewed and stable  Last Vitals:  Vitals Value Taken Time  BP 114/97 03/21/22 1530  Temp    Pulse 82 03/21/22 1534  Resp 13 03/21/22 1534  SpO2 88 % 03/21/22 1534  Vitals shown include unvalidated device data.  Last Pain:  Vitals:   03/21/22 0945  TempSrc:   PainSc: 0-No pain      Patients Stated Pain Goal: 3 (30/09/23 3007)  Complications: No notable events documented.

## 2022-03-22 ENCOUNTER — Other Ambulatory Visit: Payer: Self-pay | Admitting: Gynecologic Oncology

## 2022-03-22 ENCOUNTER — Encounter (HOSPITAL_COMMUNITY): Payer: Self-pay | Admitting: Gynecologic Oncology

## 2022-03-22 ENCOUNTER — Telehealth: Payer: Self-pay

## 2022-03-22 ENCOUNTER — Other Ambulatory Visit (HOSPITAL_COMMUNITY): Payer: Self-pay

## 2022-03-22 DIAGNOSIS — N368 Other specified disorders of urethra: Secondary | ICD-10-CM

## 2022-03-22 MED ORDER — LIDOCAINE HCL URETHRAL/MUCOSAL 2 % EX PRSY: 1.0000 | PREFILLED_SYRINGE | CUTANEOUS | 2 refills | Status: DC | PRN

## 2022-03-22 MED ORDER — LIDOCAINE HCL URETHRAL/MUCOSAL 2 % EX PRSY
1.0000 | PREFILLED_SYRINGE | CUTANEOUS | 2 refills | Status: DC | PRN
  Filled 2022-03-22: qty 20, 4d supply, fill #0

## 2022-03-22 NOTE — Addendum Note (Signed)
Addended by: [name] on: 03/22/2022 12:29 PM   Modules accepted: Orders

## 2022-03-22 NOTE — Telephone Encounter (Signed)
Spoke with Ms. Gargan this morning. She endorses severe discomfort in her urethra. She states "The last time I had surgery and had a catheter I had the same issue. I was in pain for 3 weeks. It burns with urination and is painful even when I'm not urinating." Patient rates urethral pain as 7/10. She has taken aleve, tylenol and tramadol without relief. She has also been applying ice packs to the area.  Advised that Dr. Berline Lopes would be notified. Recommended she try using a peri-bottle while urinating to dilute urine. Patient verbalized understanding and states she will try that.   She states she is otherwise doing well. She is eating, drinking and urinating well. She has not had a BM yet and is not passing gas. She is taking senokot as prescribed and encouraged her to drink plenty of water. She denies fever or chills. Incisions are dry and intact.    Instructed to call office with any fever, chills, purulent drainage, uncontrolled pain or any other questions or concerns. Patient verbalizes understanding.   Pt aware of post op appointments as well as the office number [phone] and after hours number [phone] to call if she has any questions or concerns

## 2022-03-22 NOTE — Telephone Encounter (Signed)
Following up with Terri Heath, she reports the pain is external on her urethra. Advised that Dr. Berline Lopes will send in some topical lidocaine to apply for pain relief of the urethra.  Instructed patient to notify our office if pain persists or worsens,she develops fever, chills or dysuria symptoms. Patient verbalized understanding.  Patient prefers prescription be sent to [facility].

## 2022-03-23 ENCOUNTER — Other Ambulatory Visit (HOSPITAL_COMMUNITY): Payer: Self-pay

## 2022-03-23 NOTE — Anesthesia Postprocedure Evaluation (Signed)
Anesthesia Post Note  Patient: Terri Heath  Procedure(s) Performed: XI ROBOTIC ASSISTED TOTAL HYSTERECTOMY WITH BILATERAL SALPINGO OOPHORECTOMY, LYSIS OF ADHESIONS, CYSTOSCOPY (Bilateral)     Patient location during evaluation: PACU Anesthesia Type: General Level of consciousness: awake and alert Pain management: pain level controlled Vital Signs Assessment: post-procedure vital signs reviewed and stable Respiratory status: spontaneous breathing, nonlabored ventilation, respiratory function stable and patient connected to nasal cannula oxygen Cardiovascular status: blood pressure returned to baseline and stable Postop Assessment: no apparent nausea or vomiting Anesthetic complications: no   No notable events documented.  Last Vitals:  Vitals:   03/21/22 1600 03/21/22 1616  BP: (!) 141/77 140/78  Pulse: 81 88  Resp: 13 20  Temp:  36.6 C  SpO2: 92%     Last Pain:  Vitals:   03/21/22 1616  TempSrc: Oral  PainSc: 0-No pain                 Emelyn Roen S

## 2022-03-24 ENCOUNTER — Inpatient Hospital Stay (HOSPITAL_BASED_OUTPATIENT_CLINIC_OR_DEPARTMENT_OTHER): Payer: 59 | Admitting: Gynecologic Oncology

## 2022-03-24 ENCOUNTER — Encounter: Payer: Self-pay | Admitting: Gynecologic Oncology

## 2022-03-24 DIAGNOSIS — C541 Malignant neoplasm of endometrium: Secondary | ICD-10-CM

## 2022-03-24 DIAGNOSIS — Z90710 Acquired absence of both cervix and uterus: Secondary | ICD-10-CM

## 2022-03-24 DIAGNOSIS — Z90722 Acquired absence of ovaries, bilateral: Secondary | ICD-10-CM

## 2022-03-24 DIAGNOSIS — Z7189 Other specified counseling: Secondary | ICD-10-CM

## 2022-03-24 NOTE — Progress Notes (Signed)
Gynecologic Oncology Telehealth Consult Note: Gyn-Onc  I connected with Terri Heath on 03/24/22 at  1:20 PM EDT by telephone and verified that I am speaking with the correct person using two identifiers.  I discussed the limitations, risks, security and privacy concerns of performing an evaluation and management service by telemedicine and the availability of in-person appointments. I also discussed with the patient that there may be a patient responsible charge related to this service. The patient expressed understanding and agreed to proceed.  Other persons participating in the visit and their role in the encounter: none.  Patient's location: home Provider's location: [facility]  Reason for Visit: follow-up after surgery, pathology review  Treatment History: Oncology History Overview Note  Endometrioid adenocarcinoma   Endometrial cancer ([place])  10/27/2021 Pathology Results   FINAL MICROSCOPIC DIAGNOSIS:   A. ENDOMETRIUM, BIOPSY:  - Endometrioid adenocarcinoma with focal secretory features.  - See comment.   COMMENT:  The carcinoma has endometrioid features with a few small foci with secretory features.  There are several foci of solid pattern and the findings are consistent with FIGO grade 1/2.  Immunohistochemistry shows diffuse strong positivity estrogen receptor and Napsin A is negative.  P53 shows wild-type pattern.    11/07/2021 Initial Diagnosis   Endometrial cancer ([place])   11/14/2021 Imaging   MRI pelvis 1. Thickened (18 mm) endometrium, compatible with known endometrial malignancy. Evidence of full-thickness myometrial invasion by endometrial tumor throughout the left uterine body extending over 180 degrees of involvement. Probable early small focus of direct tumor invasion into the parametrial soft tissues in the anterior left uterine body as detailed. 2. Suspect a combination of direct endometrial tumor involvement of the upper endocervical canal and blood products within  the endocervix. 3. No pelvic lymphadenopathy.  No adnexal masses.       11/24/2021 Imaging   CT abdomen and pelvis No evidence of abdominal metastatic disease or other acute findings.   Mild hepatic steatosis.   Colonic diverticulosis, without radiographic evidence of diverticulitis.   Aortic Atherosclerosis (ICD10-I70.0).     12/02/2021 Cancer Staging   Staging form: Corpus Uteri - Carcinoma and Carcinosarcoma, AJCC 8th Edition - Clinical stage from 12/02/2021: FIGO Stage IIIB (cT3b, cN0, cM0) - Signed by Heath Lark, MD on 12/02/2021 Stage prefix: Initial diagnosis   12/07/2021 Procedure   Status post placement of right IJ port catheter   12/13/2021 - 01/10/2022 Chemotherapy   Patient is on Treatment Plan : Uterine Cisplatin days 1 and 29 + XRT      12/14/2021 - 02/01/2022 Radiation Therapy   Radiation Treatment Dates: 12/14/2021 through 02/01/2022 (IMRT : 12/14/21 through 01/17/22) (Brachytherapy - Tandem Ring : 02/01/22)  Site Technique Total Dose (Gy) Dose per Fx (Gy) Completed Fx Beam Energies  Uterus: Uterus IMRT 45/45 1.8 25/25 10X  Uterus: Uterus_Bst HDR-brachy 5.5/5.5 5.5 1/1 Ir-192        01/25/2022 Imaging   MRI pelvis Decreased endometrial thickness, consistent with interval response to therapy. Persistent deep myometrial invasion (> 50%) noted in the left lateral uterine corpus. No evidence of extra-uterine tumor extension.    No evidence of pelvic metastatic disease.   Sigmoid diverticulosis, without evidence of diverticulitis.   03/21/2022 Pathology Results   FINAL MICROSCOPIC DIAGNOSIS:   A. 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, FIGO 1  Focal residual atypical hyperplasia with squamous morular metaplasia  Tumor invades greater than 50% of the myometrium (9.75  mm of 16 mm)  (ypT1b)  Margins free  Extensive adenomyosis  Background  inactive endometrium  Serosal endosalpingosis  Acute and chronic cervicitis with mucosal necrosis and numerous  nabothian cysts and tunnel clusters  Benign fallopian tubes and ovaries   ONCOLOGY TABLE:   UTERUS, CARCINOMA OR CARCINOSARCOMA: Resection   Procedure: Total hysterectomy and bilateral salpingo-oophorectomy  Histologic Type: Endometrioid adenocarcinoma  Histologic Grade: Well-differentiated, FIGO 1  Myometrial Invasion:       Depth of Myometrial Invasion (mm): 9.75 mm       Myometrial Thickness (mm): 16 mm       Percentage of Myometrial Invasion: 61%  Uterine Serosa Involvement: Not identified  Cervical stromal Involvement: Not identified  Extent of involvement of other tissue/organs: Not identified  Peritoneal/Ascitic Fluid: Not available  Lymphovascular Invasion: Not identified  Regional Lymph Nodes: Not applicable (no lymph nodes submitted or found)   Distant Metastasis: Not applicable  Pathologic Stage Classification (pTNM, AJCC 8th Edition): ypT1b, pN n/a  Ancillary Studies: MMR / MSI has been ordered and will be reported in an  addendum  Representative Tumor Block: A7  Comment(s): The anterior endomyometrium shows extensive adenomyosis and scattered intramyometrial psammomatous calcifications.  Residual invasive tumor is identified within the posterior endomyometrium.  The otherwise benign endometrial and cervical epithelium shows apparent reactive/therapy associated atypia.  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 Obesity made retroperitoneal visualization  limited and increased the complexity of the case and necessitated additional instrumentation for retraction. Obesity related complexity increased the duration of the procedure by 45 minutes.    Surgeon: Jeral Pinch MD    Operative Findings:  On EUA, 10 cm mobile uterus. On intra-abdominal entry, normal upper abdominal survey.  Omentum densely adherent to anterior abdominal wall along prior midline infraumbilical incision.  Normal-appearing small and large bowel.  Difficult intra-abdominal adiposity.  Cecum somewhat adherent to the right abdominal wall secondary to prior appendectomy.  Uterus approximately 8-10 cm and somewhat bulbous.  Normal-appearing bilateral adnexa.  No obvious adenopathy.  Some edema of the retroperitoneum.  Densely adherent bladder to the uterus and cervix from prior cesarean sections.  No gross evidence of disease. On cystoscopy, intact bladder dome. Good efflux from bilateral ureteral orifices.     Interval History: Doing well.  Improving daily.  Has not used pain medication today or yesterday.  Ambulating well.  Has had bowel movements the last 2 days.  Has some urinary urgency, improving since surgery.  Denies any bleeding or discharge.  Past Medical/Surgical History: Past Medical History:  Diagnosis Date   Acute systolic heart failure (HCC)    Allergy    Asthma    Bilateral shoulder pain    Borderline hypertension    Bronchitis    CAD (coronary artery disease)    Chronic systolic CHF (congestive heart failure) (HCC)    Chronic systolic congestive heart failure, NYHA class 3 ([place]) 07/24/2018   Diabetes mellitus    Diagnosed in 2000, on insulin, Trulicity and metformin, not checking cgs at home   Gangrenous appendicitis with perforation s/p lap appendectomy 10/04/2017 10/04/2017   GERD (gastroesophageal reflux disease)    History of MI (myocardial infarction)  History of radiation therapy    Uterus- 12/14/21-02/01/22- Dr. Gery Pray   Hyperlipidemia     Hypertension    Hypothyroidism    MGUS (monoclonal gammopathy of unknown significance)    Mitral regurgitation    Myocardial infarction ([place])    Neuropathy    Obesity    Sleep apnea    uses CPAP   Statin intolerance    Uterine cancer [facility])     Past Surgical History:  Procedure Laterality Date   BREATH TEK H PYLORI  09/18/2011   Procedure: BREATH TEK H PYLORI;  Surgeon: Shann Medal, MD;  Location: [facility];  Service: General;  Laterality: N/A;   CESAREAN SECTION     x 3   IR IMAGING GUIDED PORT INSERTION  12/07/2021   LAPAROSCOPIC APPENDECTOMY N/A 10/03/2017   Procedure: APPENDECTOMY LAPAROSCOPIC;  Surgeon: Leighton Ruff, MD;  Location: [facility];  Service: General;  Laterality: N/A;   OPERATIVE ULTRASOUND N/A 02/01/2022   Procedure: OPERATIVE ULTRASOUND;  Surgeon: Gery Pray, MD;  Location: [facility];  Service: Urology;  Laterality: N/A;   RIGHT/LEFT HEART CATH AND CORONARY ANGIOGRAPHY N/A 02/12/2018   Procedure: RIGHT/LEFT HEART CATH AND CORONARY ANGIOGRAPHY;  Surgeon: Jettie Booze, MD;  Location: [facility];  Service: Cardiovascular;  Laterality: N/A;   ROBOTIC ASSISTED TOTAL HYSTERECTOMY WITH BILATERAL SALPINGO OOPHERECTOMY Bilateral 03/21/2022   Procedure: XI ROBOTIC ASSISTED TOTAL HYSTERECTOMY WITH BILATERAL SALPINGO OOPHORECTOMY, LYSIS OF ADHESIONS, CYSTOSCOPY;  Surgeon: [name], MD;  Location: [facility];  Service: Gynecology;  Laterality: Bilateral;   TANDEM RING INSERTION N/A 02/01/2022   Procedure: TANDEM RING INSERTION;  Surgeon: Gery Pray, MD;  Location: [facility];  Service: Urology;  Laterality: N/A;   TUBAL LIGATION      Family History  Problem Relation Age of Onset   Alcohol abuse Mother    Arthritis Mother    Diabetes Mother    Sleep apnea Mother    Anxiety disorder Mother    Eating disorder Mother    Obesity Mother    Cancer Father    Alcohol abuse Father    Heart disease Father    Hypertension Father    Stroke Father    Sleep  apnea Sister    Breast cancer Neg Hx    Colon cancer Neg Hx    Ovarian cancer Neg Hx    Endometrial cancer Neg Hx    Pancreatic cancer Neg Hx    Prostate cancer Neg Hx     Social History   Socioeconomic History   Marital status: Married    Spouse name: Not on file   Number of children: Not on file   Years of education: Not on file   Highest education level: Not on file  Occupational History   Occupation: retired Brewing technologist, worked at [employer]  Tobacco Use   Smoking status: Former    Packs/day: 1.00    Years: 15.00    Total pack years: 15.00    Types: Cigarettes    Quit date: 01/28/1990    Years since quitting: 32.1   Smokeless tobacco: Never  Vaping Use   Vaping Use: Never used  Substance and Sexual Activity   Alcohol use: Not Currently   Drug use: No   Sexual activity: Not Currently    Birth control/protection: Post-menopausal, Abstinence  Other Topics Concern   Not on file  Social History Narrative   Lives in [place]   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  Social Determinants of Health   Financial Resource Strain: Not on file  Food Insecurity: Not on file  Transportation Needs: Not on file  Physical Activity: Inactive (01/07/2021)   Exercise Vital Sign    Days of Exercise per Week: 0 days    Minutes of Exercise per Session: 0 min  Stress: Not on file  Social Connections: Not on file    Current Medications:  Current Outpatient Medications:    acetaminophen (TYLENOL) 500 MG tablet, Take 1,000 mg by mouth every 6 (six) hours as needed for moderate pain., Disp: , Rfl:    albuterol (PROAIR HFA) 108 (90 Base) MCG/ACT inhaler, Inhale 2 puffs into the lungs every 6 (six) hours as needed. wheezing, Disp: 18 g, Rfl: 2   aspirin 81 MG chewable tablet, Chew 1 tablet (81 mg total) by mouth daily., Disp: 90 tablet, Rfl: 3   blood glucose meter kit and supplies, Use up to four times daily as directed. (FOR ICD-10 E10.9, E11.9)., Disp: 1 each, Rfl: 99   carvedilol  (COREG) 6.25 MG tablet, Take 1 tablet (6.25 mg total) by mouth 2 (two) times daily with a meal., Disp: 60 tablet, Rfl: 1   diphenhydrAMINE (BENADRYL) 25 MG tablet, Take 25 mg by mouth at bedtime., Disp: , Rfl:    diphenoxylate-atropine (LOMOTIL) 2.5-0.025 MG tablet, Take 1 tablet by mouth 4 (four) times daily as needed for diarrhea or loose stools. (Patient not taking: Reported on 03/06/2022), Disp: 30 tablet, Rfl: 0   Dulaglutide (TRULICITY) 3 UX/3.2TF SOPN, Inject 3 mg into the skin once a week., Disp: 6 mL, Rfl: 3   Evolocumab (REPATHA SURECLICK) 573 MG/ML SOAJ, Inject 1 pen into the skin every 14 (fourteen) days., Disp: 2 mL, Rfl: 11   furosemide (LASIX) 20 MG tablet, Take 1 tablet (20 mg total) by mouth daily., Disp: 30 tablet, Rfl: 1   glucose blood test strip, Use as directed to test blood sugar, Disp: 100 each, Rfl: PRN   insulin glargine (LANTUS) 100 UNIT/ML Solostar Pen, Inject 25-40 Units into the skin at bedtime based on blood sugar readings and adjust via sliding scale., Disp: 45 mL, Rfl: 3   Insulin Pen Needle (UNIFINE PENTIPS) 32G X 4 MM MISC, USE AS DIRECTED ONCE A DAY, Disp: 100 each, Rfl: 3   levothyroxine (SYNTHROID) 50 MCG tablet, Take 1 tablet (50 mcg total) by mouth daily before breakfast., Disp: 90 tablet, Rfl: 3   lidocaine (XYLOCAINE) 2 % jelly, Place 1 Application into the urethra as needed., Disp: 20 mL, Rfl: 2   lidocaine-prilocaine (EMLA) cream, Apply to affected area once (Patient taking differently: 1 application  daily as needed (to draw labs of infusion).), Disp: 30 g, Rfl: 3   loperamide (IMODIUM) 2 MG capsule, Take 2 mg by mouth as needed for diarrhea or loose stools., Disp: , Rfl:    Magnesium 250 MG TABS, Take 250 mg by mouth daily., Disp: , Rfl:    magnesium oxide (MAG-OX) 400 (240 Mg) MG tablet, Take 1 tablet (400 mg total) by mouth daily. (Patient not taking: Reported on 03/03/2022), Disp: 30 tablet, Rfl: 0   metFORMIN (GLUCOPHAGE) 1000 MG tablet, Take 1 tablet  (1,000 mg total) by mouth 2 (two) times daily with a meal., Disp: 180 tablet, Rfl: 3   Multiple Vitamin (MULITIVITAMIN WITH MINERALS) TABS, Take 1 tablet by mouth daily with breakfast., Disp: , Rfl:    NON FORMULARY, Pt uses a cpap nightly, Disp: , Rfl:    ondansetron (ZOFRAN) 8 MG  tablet, Take 1 tablet (8 mg total) by mouth 2 (two) times daily as needed. Start on the third day after chemotherapy., Disp: 30 tablet, Rfl: 1   OneTouch Delica Lancets 28J MISC, use as directed, Disp: 100 each, Rfl: 1   prochlorperazine (COMPAZINE) 10 MG tablet, Take 1 tablet (10 mg total) by mouth every 6 (six) hours as needed (Nausea or vomiting)., Disp: 30 tablet, Rfl: 1   sacubitril-valsartan (ENTRESTO) 49-51 MG, Take 1 tablet by mouth 2 (two) times daily., Disp: 60 tablet, Rfl: 4   senna-docusate (SENOKOT-S) 8.6-50 MG tablet, Take 2 tablets by mouth at bedtime. For AFTER surgery, do not take if having diarrhea, Disp: 30 tablet, Rfl: 0   spironolactone (ALDACTONE) 25 MG tablet, Take 1 tablet (25 mg total) by mouth daily., Disp: 60 tablet, Rfl: 1   traMADol (ULTRAM) 50 MG tablet, Take 1 tablet (50 mg total) by mouth every 6 (six) hours as needed for severe pain. For AFTER surgery only, do not take and drive, Disp: 10 tablet, Rfl: 0   triamcinolone ointment (KENALOG) 0.1 %, Apply 1 application topically two times daily to affected area(s) as needed, sparing use to avoid whitening/thinning skin, Disp: 30 g, Rfl: 1  Review of Symptoms: Pertinent positives as per HPI.  Physical Exam: There were no vitals taken for this visit. Deferred given limitations of phone visit.  Laboratory & Radiologic Studies: SURGICAL PATHOLOGY  FINAL MICROSCOPIC DIAGNOSIS:   A. 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  Patient is doing very well postoperatively and meeting milestones.  Discussed continued expectations and limitations.  Reviewed pathology with her which shows some residual cancer within the uterus.  Cervix and all margins free of disease.  Patient is very happy with this news.  Discussed that based on pathology results, no adjuvant treatment is recommended.  We will discuss plan for surveillance at her follow-up visit in person.  I discussed the assessment and treatment plan with the patient. The patient was provided with an opportunity to ask questions and all were answered. The patient agreed with the plan and demonstrated an understanding of the instructions.   The patient was advised to call back or see an in-person evaluation if the symptoms worsen or if the condition fails to improve as anticipated.   12 minutes of total time was spent for this patient encounter, including preparation, phone counseling with the patient and coordination of care, and documentation of the encounter.   Jeral Pinch, MD  Division of Gynecologic Oncology  Department of Obstetrics and Gynecology  [facility]

## 2022-03-27 ENCOUNTER — Other Ambulatory Visit: Payer: Self-pay | Admitting: Hematology and Oncology

## 2022-03-27 ENCOUNTER — Telehealth: Payer: Self-pay

## 2022-03-27 DIAGNOSIS — C541 Malignant neoplasm of endometrium: Secondary | ICD-10-CM

## 2022-03-27 NOTE — Telephone Encounter (Signed)
-----   Message from Heath Lark, MD sent at 03/27/2022  9:20 AM EDT ----- Pls let her know I placed order for port removal

## 2022-03-27 NOTE — Telephone Encounter (Signed)
Called and given below message. She verbalized understanding and given phone # for IR.

## 2022-03-28 ENCOUNTER — Telehealth: Payer: Medicare HMO | Admitting: Gynecologic Oncology

## 2022-03-29 ENCOUNTER — Other Ambulatory Visit (HOSPITAL_COMMUNITY): Payer: Self-pay

## 2022-04-03 ENCOUNTER — Other Ambulatory Visit (HOSPITAL_COMMUNITY): Payer: Self-pay | Admitting: Cardiology

## 2022-04-03 ENCOUNTER — Other Ambulatory Visit (HOSPITAL_COMMUNITY): Payer: Self-pay

## 2022-04-03 ENCOUNTER — Other Ambulatory Visit (HOSPITAL_BASED_OUTPATIENT_CLINIC_OR_DEPARTMENT_OTHER): Payer: Self-pay | Admitting: Nurse Practitioner

## 2022-04-03 DIAGNOSIS — E1165 Type 2 diabetes mellitus with hyperglycemia: Secondary | ICD-10-CM

## 2022-04-03 DIAGNOSIS — I5022 Chronic systolic (congestive) heart failure: Secondary | ICD-10-CM

## 2022-04-03 MED ORDER — CARVEDILOL 6.25 MG PO TABS
6.2500 mg | ORAL_TABLET | Freq: Two times a day (BID) | ORAL | 6 refills | Status: DC
  Filled 2022-04-03: qty 60, 30d supply, fill #0
  Filled 2022-05-02: qty 60, 30d supply, fill #1
  Filled 2022-06-14: qty 60, 30d supply, fill #2
  Filled 2022-06-25 – 2022-07-17 (×2): qty 60, 30d supply, fill #3
  Filled 2022-08-10: qty 60, 30d supply, fill #4
  Filled 2022-09-08: qty 60, 30d supply, fill #5
  Filled 2022-10-10: qty 60, 30d supply, fill #6

## 2022-04-04 ENCOUNTER — Other Ambulatory Visit: Payer: Self-pay | Admitting: *Deleted

## 2022-04-04 ENCOUNTER — Ambulatory Visit (HOSPITAL_COMMUNITY)
Admission: RE | Admit: 2022-04-04 | Discharge: 2022-04-04 | Disposition: A | Discharge status: Home / Self Care | Payer: Medicare HMO | Source: Ambulatory Visit | Attending: Hematology and Oncology | Admitting: Hematology and Oncology

## 2022-04-04 DIAGNOSIS — Z452 Encounter for adjustment and management of vascular access device: Secondary | ICD-10-CM | POA: Diagnosis not present

## 2022-04-04 DIAGNOSIS — C541 Malignant neoplasm of endometrium: Secondary | ICD-10-CM | POA: Insufficient documentation

## 2022-04-04 HISTORY — PX: IR REMOVAL TUN ACCESS W/ PORT W/O FL MOD SED: IMG2290

## 2022-04-04 MED ORDER — LIDOCAINE HCL 1 % IJ SOLN
INTRAMUSCULAR | Status: AC
  Filled 2022-04-04: qty 20

## 2022-04-04 NOTE — Progress Notes (Signed)
The proposed treatment discussed in conference is for discussion purpose only and is not a binding recommendation.  The patients have not been physically examined, or presented with their treatment options.  Therefore, final treatment plans cannot be decided.  

## 2022-04-04 NOTE — Procedures (Signed)
Interventional Radiology Procedure Note  Procedure: Port removal  Complications: None  Estimated Blood Loss: < 10 mL  Findings: Right chest port removed in entirety. Wound closed.  Venetia Night. Kathlene Cote, M.D Pager:  [phone]

## 2022-04-05 ENCOUNTER — Encounter (INDEPENDENT_AMBULATORY_CARE_PROVIDER_SITE_OTHER): Payer: Self-pay

## 2022-04-05 LAB — SURGICAL PATHOLOGY

## 2022-04-07 ENCOUNTER — Encounter: Payer: Self-pay | Admitting: Gynecologic Oncology

## 2022-04-10 ENCOUNTER — Encounter: Payer: Self-pay | Admitting: Gynecologic Oncology

## 2022-04-10 ENCOUNTER — Inpatient Hospital Stay: Payer: 59 | Attending: Gynecologic Oncology | Admitting: Gynecologic Oncology

## 2022-04-10 ENCOUNTER — Other Ambulatory Visit: Payer: Self-pay

## 2022-04-10 VITALS — BP 105/57 | HR 99 | Temp 97.7°F | Resp 17 | Wt 281.0 lb

## 2022-04-10 DIAGNOSIS — Z90710 Acquired absence of both cervix and uterus: Secondary | ICD-10-CM

## 2022-04-10 DIAGNOSIS — Z90722 Acquired absence of ovaries, bilateral: Secondary | ICD-10-CM

## 2022-04-10 DIAGNOSIS — Z7189 Other specified counseling: Secondary | ICD-10-CM

## 2022-04-10 DIAGNOSIS — C541 Malignant neoplasm of endometrium: Secondary | ICD-10-CM

## 2022-04-10 NOTE — Patient Instructions (Addendum)
It was good to see you today.  You are healing well from surgery.  Please remember, no heavy lifting for at least 6 weeks after surgery and nothing in the vagina for at least 10-12 weeks.  Because of the radiation, if you can be careful about heavy lifting and pelvic rest for even longer than this amount of time, that would be great from a healing standpoint.  We will plan on visits initially every 3 months.  We will get a CT scan about 6 months after surgery.  If you develop any new and concerning symptoms that we discussed today before your next visit with me, please call to see me sooner.

## 2022-04-10 NOTE — Progress Notes (Signed)
Gynecologic Oncology Return Clinic Visit  04/10/2022  Reason for Visit: Follow-up after recent surgery, treatment planning  Treatment History: Oncology History Overview Note  Endometrioid adenocarcinoma   Endometrial cancer ([place])  10/27/2021 Pathology Results   FINAL MICROSCOPIC DIAGNOSIS:   A. ENDOMETRIUM, BIOPSY:  - Endometrioid adenocarcinoma with focal secretory features.  - See comment.   COMMENT:  The carcinoma has endometrioid features with a few small foci with secretory features.  There are several foci of solid pattern and the findings are consistent with FIGO grade 1/2.  Immunohistochemistry shows diffuse strong positivity estrogen receptor and Napsin A is negative.  P53 shows wild-type pattern.    11/07/2021 Initial Diagnosis   Endometrial cancer ([place])   11/14/2021 Imaging   MRI pelvis 1. Thickened (18 mm) endometrium, compatible with known endometrial malignancy. Evidence of full-thickness myometrial invasion by endometrial tumor throughout the left uterine body extending over 180 degrees of involvement. Probable early small focus of direct tumor invasion into the parametrial soft tissues in the anterior left uterine body as detailed. 2. Suspect a combination of direct endometrial tumor involvement of the upper endocervical canal and blood products within the endocervix. 3. No pelvic lymphadenopathy.  No adnexal masses.       11/24/2021 Imaging   CT abdomen and pelvis No evidence of abdominal metastatic disease or other acute findings.   Mild hepatic steatosis.   Colonic diverticulosis, without radiographic evidence of diverticulitis.   Aortic Atherosclerosis (ICD10-I70.0).     12/02/2021 Cancer Staging   Staging form: Corpus Uteri - Carcinoma and Carcinosarcoma, AJCC 8th Edition - Clinical stage from 12/02/2021: FIGO Stage IIIB (cT3b, cN0, cM0) - Signed by Heath Lark, MD on 12/02/2021 Stage prefix: Initial diagnosis   12/07/2021 Procedure   Status post placement of  right IJ port catheter   12/13/2021 - 01/10/2022 Chemotherapy   Patient is on Treatment Plan : Uterine Cisplatin days 1 and 29 + XRT      12/14/2021 - 02/01/2022 Radiation Therapy   Radiation Treatment Dates: 12/14/2021 through 02/01/2022 (IMRT : 12/14/21 through 01/17/22) (Brachytherapy - Tandem Ring : 02/01/22)  Site Technique Total Dose (Gy) Dose per Fx (Gy) Completed Fx Beam Energies  Uterus: Uterus IMRT 45/45 1.8 25/25 10X  Uterus: Uterus_Bst HDR-brachy 5.5/5.5 5.5 1/1 Ir-192        01/25/2022 Imaging   MRI pelvis Decreased endometrial thickness, consistent with interval response to therapy. Persistent deep myometrial invasion (> 50%) noted in the left lateral uterine corpus. No evidence of extra-uterine tumor extension.    No evidence of pelvic metastatic disease.   Sigmoid diverticulosis, without evidence of diverticulitis.   03/21/2022 Pathology Results   FINAL MICROSCOPIC DIAGNOSIS:   A. 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, FIGO 1  Focal residual atypical hyperplasia with squamous morular metaplasia  Tumor invades greater than 50% of the myometrium (9.75 mm of 16 mm)  (ypT1b)  Margins free  Extensive adenomyosis  Background inactive endometrium  Serosal endosalpingosis  Acute and chronic cervicitis with mucosal necrosis and numerous  nabothian cysts and tunnel clusters  Benign fallopian tubes and ovaries   ONCOLOGY TABLE:   UTERUS, CARCINOMA OR CARCINOSARCOMA: Resection   Procedure: Total hysterectomy and bilateral salpingo-oophorectomy  Histologic Type: Endometrioid adenocarcinoma  Histologic Grade: Well-differentiated, FIGO 1  Myometrial Invasion:       Depth of Myometrial Invasion (mm): 9.75 mm       Myometrial Thickness (mm): 16 mm  Percentage of Myometrial Invasion: 61%  Uterine Serosa Involvement: Not identified  Cervical  stromal Involvement: Not identified  Extent of involvement of other tissue/organs: Not identified  Peritoneal/Ascitic Fluid: Not available  Lymphovascular Invasion: Not identified  Regional Lymph Nodes: Not applicable (no lymph nodes submitted or found)   Distant Metastasis: Not applicable  Pathologic Stage Classification (pTNM, AJCC 8th Edition): ypT1b, pN n/a  Ancillary Studies: MMR / MSI has been ordered and will be reported in an  addendum  Representative Tumor Block: A7  Comment(s): The anterior endomyometrium shows extensive adenomyosis and scattered intramyometrial psammomatous calcifications.  Residual invasive tumor is identified within the posterior endomyometrium.  The otherwise benign endometrial and cervical epithelium shows apparent reactive/therapy associated atypia.  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 Obesity made retroperitoneal visualization limited and increased the complexity of the case and necessitated additional instrumentation for retraction. Obesity related complexity increased the duration of the procedure by 45 minutes.    Surgeon: Jeral Pinch MD    Operative Findings:  On EUA, 10 cm mobile uterus. On intra-abdominal entry, normal upper abdominal survey.  Omentum densely adherent to anterior abdominal wall along prior midline infraumbilical incision.  Normal-appearing small and large bowel.  Difficult intra-abdominal adiposity.  Cecum somewhat adherent to the right abdominal wall secondary to prior appendectomy.  Uterus approximately 8-10 cm and somewhat  bulbous.  Normal-appearing bilateral adnexa.  No obvious adenopathy.  Some edema of the retroperitoneum.  Densely adherent bladder to the uterus and cervix from prior cesarean sections.  No gross evidence of disease. On cystoscopy, intact bladder dome. Good efflux from bilateral ureteral orifices.   04/05/2022 Procedure   Removal of implanted Port-A-Cath utilizing sharp and blunt dissection. The procedure was uncomplicated.     Interval History: Ports overall doing very well.  She denies any abdominal or pelvic pain.  She denies vaginal bleeding or discharge.  Reports baseline bowel and bladder function.  Is tolerating p.o. intake without nausea or emesis.  Is being careful not to lift anything heavy.  Had her port removed last week.  Past Medical/Surgical History: Past Medical History:  Diagnosis Date   Acute systolic heart failure (HCC)    Allergy    Asthma    Bilateral shoulder pain    Borderline hypertension    Bronchitis    CAD (coronary artery disease)    Chronic systolic CHF (congestive heart failure) (HCC)    Chronic systolic congestive heart failure, NYHA class 3 ([place]) 07/24/2018   Diabetes mellitus    Diagnosed in 2000, on insulin, Trulicity and metformin, not checking cgs at home   Gangrenous appendicitis with perforation s/p lap appendectomy 10/04/2017 10/04/2017   GERD (gastroesophageal reflux disease)    History of MI (myocardial infarction)    History of radiation therapy    Uterus- 12/14/21-02/01/22- Dr. Gery Pray   Hyperlipidemia    Hypertension    Hypothyroidism    MGUS (monoclonal gammopathy of unknown significance)    Mitral regurgitation    Myocardial infarction ([place])    Neuropathy    Obesity    Sleep apnea    uses CPAP   Statin intolerance    Uterine cancer ([place])  Past Surgical History:  Procedure Laterality Date   BREATH TEK H PYLORI  09/18/2011   Procedure: BREATH TEK H PYLORI;  Surgeon: Shann Medal, MD;  Location: [facility];  Service:  General;  Laterality: N/A;   CESAREAN SECTION     x 3   IR IMAGING GUIDED PORT INSERTION  12/07/2021   IR REMOVAL TUN ACCESS W/ PORT W/O FL MOD SED  04/04/2022   LAPAROSCOPIC APPENDECTOMY N/A 10/03/2017   Procedure: APPENDECTOMY LAPAROSCOPIC;  Surgeon: Leighton Ruff, MD;  Location: [facility];  Service: General;  Laterality: N/A;   OPERATIVE ULTRASOUND N/A 02/01/2022   Procedure: OPERATIVE ULTRASOUND;  Surgeon: Gery Pray, MD;  Location: [facility];  Service: Urology;  Laterality: N/A;   RIGHT/LEFT HEART CATH AND CORONARY ANGIOGRAPHY N/A 02/12/2018   Procedure: RIGHT/LEFT HEART CATH AND CORONARY ANGIOGRAPHY;  Surgeon: Jettie Booze, MD;  Location: [facility];  Service: Cardiovascular;  Laterality: N/A;   ROBOTIC ASSISTED TOTAL HYSTERECTOMY WITH BILATERAL SALPINGO OOPHERECTOMY Bilateral 03/21/2022   Procedure: XI ROBOTIC ASSISTED TOTAL HYSTERECTOMY WITH BILATERAL SALPINGO OOPHORECTOMY, LYSIS OF ADHESIONS, CYSTOSCOPY;  Surgeon: [name], MD;  Location: [facility];  Service: Gynecology;  Laterality: Bilateral;   TANDEM RING INSERTION N/A 02/01/2022   Procedure: TANDEM RING INSERTION;  Surgeon: Gery Pray, MD;  Location: [facility];  Service: Urology;  Laterality: N/A;   TUBAL LIGATION      Family History  Problem Relation Age of Onset   Alcohol abuse Mother    Arthritis Mother    Diabetes Mother    Sleep apnea Mother    Anxiety disorder Mother    Eating disorder Mother    Obesity Mother    Cancer Father    Alcohol abuse Father    Heart disease Father    Hypertension Father    Stroke Father    Sleep apnea Sister    Breast cancer Neg Hx    Colon cancer Neg Hx    Ovarian cancer Neg Hx    Endometrial cancer Neg Hx    Pancreatic cancer Neg Hx    Prostate cancer Neg Hx     Social History   Socioeconomic History   Marital status: Married    Spouse name: Not on file   Number of children: Not on file   Years of education: Not on file   Highest education level: Not on file   Occupational History   Occupation: retired Brewing technologist, worked at [employer]  Tobacco Use   Smoking status: Former    Packs/day: 1.00    Years: 15.00    Total pack years: 15.00    Types: Cigarettes    Quit date: 01/28/1990    Years since quitting: 32.2   Smokeless tobacco: Never  Vaping Use   Vaping Use: Never used  Substance and Sexual Activity   Alcohol use: Not Currently   Drug use: No   Sexual activity: Not Currently    Birth control/protection: Post-menopausal, Abstinence  Other Topics Concern   Not on file  Social History Narrative   Lives in [place]   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   Social Determinants of Health   Financial Resource Strain: Not on file  Food Insecurity: Not on file  Transportation Needs: Not on file  Physical Activity: Inactive (01/07/2021)   Exercise Vital Sign    Days of Exercise per Week: 0 days    Minutes of Exercise per Session: 0 min  Stress: Not on file  Social Connections: Not on file    Current Medications:  Current Outpatient Medications:    acetaminophen (TYLENOL) 500 MG tablet, Take 1,000 mg by mouth every 6 (six) hours as needed for moderate pain., Disp: , Rfl:    albuterol (PROAIR HFA) 108 (90 Base) MCG/ACT inhaler, Inhale 2 puffs into the lungs every 6 (six) hours as needed. wheezing, Disp: 18 g, Rfl: 2   aspirin 81 MG chewable tablet, Chew 1 tablet (81 mg total) by mouth daily., Disp: 90 tablet, Rfl: 3   blood glucose meter kit and supplies, Use up to four times daily as directed. (FOR ICD-10 E10.9, E11.9)., Disp: 1 each, Rfl: 99   carvedilol (COREG) 6.25 MG tablet, Take 1 tablet (6.25 mg total) by mouth 2 (two) times daily., Disp: 60 tablet, Rfl: 6   diphenhydrAMINE (BENADRYL) 25 MG tablet, Take 25 mg by mouth at bedtime., Disp: , Rfl:    Dulaglutide (TRULICITY) 3 IH/4.7QQ SOPN, Inject 3 mg into the skin once a week., Disp: 6 mL, Rfl: 3   Evolocumab (REPATHA SURECLICK) 595 MG/ML SOAJ, Inject 1 pen into the skin every  14 (fourteen) days., Disp: 2 mL, Rfl: 11   furosemide (LASIX) 20 MG tablet, Take 1 tablet (20 mg total) by mouth daily., Disp: 30 tablet, Rfl: 1   glucose blood test strip, Use as directed to test blood sugar, Disp: 100 each, Rfl: PRN   insulin glargine (LANTUS) 100 UNIT/ML Solostar Pen, Inject 25-40 Units into the skin at bedtime based on blood sugar readings and adjust via sliding scale., Disp: 45 mL, Rfl: 3   Insulin Pen Needle (UNIFINE PENTIPS) 32G X 4 MM MISC, USE AS DIRECTED ONCE A DAY, Disp: 100 each, Rfl: 3   levothyroxine (SYNTHROID) 50 MCG tablet, Take 1 tablet (50 mcg total) by mouth daily before breakfast., Disp: 90 tablet, Rfl: 3   magnesium oxide (MAG-OX) 400 (240 Mg) MG tablet, Take 1 tablet (400 mg total) by mouth daily., Disp: 30 tablet, Rfl: 0   metFORMIN (GLUCOPHAGE) 1000 MG tablet, Take 1 tablet (1,000 mg total) by mouth 2 (two) times daily with a meal., Disp: 180 tablet, Rfl: 3   Multiple Vitamin (MULITIVITAMIN WITH MINERALS) TABS, Take 1 tablet by mouth daily with breakfast., Disp: , Rfl:    NON FORMULARY, Pt uses a cpap nightly, Disp: , Rfl:    OneTouch Delica Lancets 63O MISC, use as directed, Disp: 100 each, Rfl: 1   sacubitril-valsartan (ENTRESTO) 49-51 MG, Take 1 tablet by mouth 2 (two) times daily., Disp: 60 tablet, Rfl: 4   senna-docusate (SENOKOT-S) 8.6-50 MG tablet, Take 2 tablets by mouth at bedtime. For AFTER surgery, do not take if having diarrhea, Disp: 30 tablet, Rfl: 0   spironolactone (ALDACTONE) 25 MG tablet, Take 1 tablet (25 mg total) by mouth daily., Disp: 60 tablet, Rfl: 1   triamcinolone ointment (KENALOG) 0.1 %, Apply 1 application topically two times daily to affected area(s) as needed, sparing use to avoid whitening/thinning skin, Disp: 30 g, Rfl: 1   diphenoxylate-atropine (LOMOTIL) 2.5-0.025 MG tablet, Take 1 tablet by mouth 4 (four) times daily as needed for diarrhea or loose stools. (Patient not taking: Reported on 03/06/2022), Disp: 30 tablet, Rfl:  0   lidocaine (XYLOCAINE) 2 % jelly, Place 1 Application into the urethra as needed., Disp: 20 mL, Rfl: 2   loperamide (IMODIUM) 2 MG capsule, Take 2 mg by mouth as needed for diarrhea or loose stools., Disp: , Rfl:    Magnesium 250 MG TABS,  Take 250 mg by mouth daily., Disp: , Rfl:    ondansetron (ZOFRAN) 8 MG tablet, Take 1 tablet (8 mg total) by mouth 2 (two) times daily as needed. Start on the third day after chemotherapy., Disp: 30 tablet, Rfl: 1   prochlorperazine (COMPAZINE) 10 MG tablet, Take 1 tablet (10 mg total) by mouth every 6 (six) hours as needed (Nausea or vomiting)., Disp: 30 tablet, Rfl: 1   traMADol (ULTRAM) 50 MG tablet, Take 1 tablet (50 mg total) by mouth every 6 (six) hours as needed for severe pain. For AFTER surgery only, do not take and drive, Disp: 10 tablet, Rfl: 0  Review of Systems: Denies appetite changes, fevers, chills, fatigue, unexplained weight changes. Denies hearing loss, neck lumps or masses, mouth sores, ringing in ears or voice changes. Denies cough or wheezing.  Denies shortness of breath. Denies chest pain or palpitations. Denies leg swelling. Denies abdominal distention, pain, blood in stools, constipation, diarrhea, nausea, vomiting, or early satiety. Denies pain with intercourse, dysuria, frequency, hematuria or incontinence. Denies hot flashes, pelvic pain, vaginal bleeding or vaginal discharge.   Denies joint pain, back pain or muscle pain/cramps. Denies itching, rash, or wounds. Denies dizziness, headaches, numbness or seizures. Denies swollen lymph nodes or glands, denies easy bruising or bleeding. Denies anxiety, depression, confusion, or decreased concentration.  Physical Exam: BP (!) 105/57 (BP Location: Right Arm, Patient Position: Sitting, Cuff Size: Large)   Pulse 99   Temp 97.7 F (36.5 C) (Axillary)   Resp 17   Wt 281 lb (127.5 kg)   SpO2 98%   BMI 44.01 kg/m  General: Alert, oriented, no acute distress. HEENT: Normocephalic,  atraumatic, sclera anicteric. Chest: Unlabored breathing on room air. Abdomen: Obese, soft, nontender.  Normoactive bowel sounds.  No masses or hepatosplenomegaly appreciated.  Well-healed incisions although to have very slightly opens, 1 with a scab. Extremities: Grossly normal range of motion.  Warm, well perfused.  No edema bilaterally. Skin: No rashes or lesions noted. GU: Normal appearing external genitalia without erythema, excoriation, or lesions.  Speculum exam reveals cuff intact, suture not visible except at the left apex.  Bimanual exam reveals cuff intact, no fluctuance or tenderness with palpation.    Laboratory & Radiologic Studies: A. 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 p53 wildtype.    Patient is doing very well postoperatively and meeting milestones.  Discussed continued expectations and limitations.  Given radiation, discussed issues with wound healing and encouraged her to be especially careful about heavy lifting and pelvic rest.  We will reach out to pathology as it was reported that MMR had been done but there was not sufficient tissue for MSI.  Not find MMR results on either her initial biopsy or her recent hysterectomy specimen.   We will plan on 44-monthpost surgery imaging.   If this is negative, then we will image for new  signs or symptoms concerning for cancer recurrence or exam findings.  Reviewed pathology with her which shows some residual cancer within the uterus.  Patient was given a copy of her pathology report today.  Cervix and all margins free of disease.  Patient is very happy with this news.  Discussed that based on pathology results, no adjuvant treatment is recommended.    Per NCCN surveillance recommendations, we will plan on surveillance visits every 3 months for the first 2-3 years.  We discussed signs and symptoms that would be concerning for cancer recurrence, and I stressed the importance of calling if she develops any of these.  22 minutes of total time was spent for this patient encounter, including preparation, face-to-face counseling with the patient and coordination of care, and documentation of the encounter.  Jeral Pinch, MD  Division of Gynecologic Oncology  Department of Obstetrics and Gynecology  [facility]

## 2022-04-10 NOTE — Progress Notes (Signed)
Spoke with Rhonda in [facility]. MMR has been completed but not showing on Epic path from July. She will notify pathologist. Also since MSI could not be performed on path from July, plan to see if MSI can be performed on biopsy from March 2023.  

## 2022-04-12 ENCOUNTER — Telehealth: Payer: Self-pay

## 2022-04-12 NOTE — Telephone Encounter (Signed)
Terri Heath states that MSI testing cannot be done on the surgical specimen as there is only tumor present. Normal tissue is needed as well to run the test. Terri Heath stated that if a tube of blood in a Purple top is obtained from the patient, the MSI testing can be done.  MMR can be run. Spoke with Alfredo Martinez and MMR was ordered  by [facility] on 04-04-22. Terri Heath only received order for MSI. Told her to go ahead and order MMR.  Terri Heath requested that Terri John, NP call her to let her know how to proceed. Told Terri Heath that Terri Heath is in the OR today and would get back with her tomorrow.

## 2022-04-14 NOTE — Telephone Encounter (Signed)
Spoke with Varney Biles and told her that Terri John, NP. Stated that Dr. Berline Lopes is fine to hold on MSI testing but wants to be sure MMR is done and would need to be normal. Varney Biles stated that the MMR results are back and need to be reviewed by pathologist. The results should be available Monday 04-17-22 morning.

## 2022-04-16 LAB — SURGICAL PATHOLOGY

## 2022-04-17 ENCOUNTER — Other Ambulatory Visit: Payer: Self-pay | Admitting: Gynecologic Oncology

## 2022-04-17 ENCOUNTER — Other Ambulatory Visit (HOSPITAL_COMMUNITY): Payer: Self-pay

## 2022-04-17 DIAGNOSIS — C541 Malignant neoplasm of endometrium: Secondary | ICD-10-CM

## 2022-04-18 ENCOUNTER — Other Ambulatory Visit: Payer: Self-pay

## 2022-04-18 ENCOUNTER — Other Ambulatory Visit (HOSPITAL_COMMUNITY): Payer: Self-pay

## 2022-04-18 ENCOUNTER — Inpatient Hospital Stay: Payer: 59

## 2022-04-18 DIAGNOSIS — C541 Malignant neoplasm of endometrium: Secondary | ICD-10-CM

## 2022-04-19 ENCOUNTER — Other Ambulatory Visit (HOSPITAL_COMMUNITY): Payer: Self-pay

## 2022-04-19 ENCOUNTER — Other Ambulatory Visit (HOSPITAL_BASED_OUTPATIENT_CLINIC_OR_DEPARTMENT_OTHER): Payer: Self-pay | Admitting: Nurse Practitioner

## 2022-04-19 DIAGNOSIS — E1159 Type 2 diabetes mellitus with other circulatory complications: Secondary | ICD-10-CM

## 2022-04-19 MED ORDER — LANTUS SOLOSTAR 100 UNIT/ML ~~LOC~~ SOPN
25.0000 [IU] | PEN_INJECTOR | Freq: Every day | SUBCUTANEOUS | 3 refills | Status: DC
  Filled 2022-04-19: qty 36, 90d supply, fill #0

## 2022-04-24 ENCOUNTER — Encounter (HOSPITAL_BASED_OUTPATIENT_CLINIC_OR_DEPARTMENT_OTHER): Payer: Self-pay

## 2022-05-02 ENCOUNTER — Telehealth: Payer: Self-pay

## 2022-05-02 ENCOUNTER — Other Ambulatory Visit (HOSPITAL_COMMUNITY): Payer: Self-pay

## 2022-05-02 NOTE — Telephone Encounter (Signed)
Per Joylene John NP, pt is aware Several genetic tests were performed on her endometrial cancer.  Based on the results, it appears the cancer is most likely sporadic and not genetically caused. Pt was thankful for the call.

## 2022-05-16 ENCOUNTER — Other Ambulatory Visit (HOSPITAL_COMMUNITY): Payer: Self-pay | Admitting: Cardiology

## 2022-05-16 ENCOUNTER — Other Ambulatory Visit (HOSPITAL_COMMUNITY): Payer: Self-pay

## 2022-05-16 MED ORDER — FUROSEMIDE 20 MG PO TABS
20.0000 mg | ORAL_TABLET | Freq: Every day | ORAL | 3 refills | Status: DC
  Filled 2022-05-16: qty 90, 90d supply, fill #0
  Filled 2022-08-03: qty 90, 90d supply, fill #1
  Filled 2022-11-01: qty 90, 90d supply, fill #2
  Filled 2023-01-19: qty 90, 90d supply, fill #3

## 2022-05-16 MED ORDER — METFORMIN HCL 1000 MG PO TABS
1000.0000 mg | ORAL_TABLET | Freq: Two times a day (BID) | ORAL | 3 refills | Status: DC
  Filled 2022-05-16: qty 180, 90d supply, fill #0
  Filled 2022-09-05: qty 180, 90d supply, fill #1
  Filled 2022-11-27: qty 180, 90d supply, fill #2

## 2022-05-16 MED ORDER — LEVOTHYROXINE SODIUM 50 MCG PO TABS
50.0000 ug | ORAL_TABLET | Freq: Every day | ORAL | 3 refills | Status: DC
  Filled 2022-05-16 (×2): qty 90, 90d supply, fill #0
  Filled 2022-09-05: qty 90, 90d supply, fill #1
  Filled 2022-11-27: qty 90, 90d supply, fill #2

## 2022-05-28 ENCOUNTER — Other Ambulatory Visit (HOSPITAL_BASED_OUTPATIENT_CLINIC_OR_DEPARTMENT_OTHER): Payer: Self-pay | Admitting: Nurse Practitioner

## 2022-05-28 ENCOUNTER — Other Ambulatory Visit: Payer: Self-pay | Admitting: Cardiology

## 2022-05-28 DIAGNOSIS — I5022 Chronic systolic (congestive) heart failure: Secondary | ICD-10-CM

## 2022-05-28 DIAGNOSIS — E1159 Type 2 diabetes mellitus with other circulatory complications: Secondary | ICD-10-CM

## 2022-05-29 ENCOUNTER — Other Ambulatory Visit (HOSPITAL_COMMUNITY): Payer: Self-pay

## 2022-05-29 MED ORDER — REPATHA SURECLICK 140 MG/ML ~~LOC~~ SOAJ
1.0000 "pen " | SUBCUTANEOUS | 11 refills | Status: DC
  Filled 2022-05-29 – 2022-06-02 (×2): qty 2, 28d supply, fill #0
  Filled 2022-06-25: qty 2, 28d supply, fill #1

## 2022-05-31 ENCOUNTER — Other Ambulatory Visit (HOSPITAL_COMMUNITY): Payer: Self-pay

## 2022-05-31 MED ORDER — TRULICITY 3 MG/0.5ML ~~LOC~~ SOAJ
3.0000 mg | SUBCUTANEOUS | 3 refills | Status: DC
  Filled 2022-05-31: qty 6, 84d supply, fill #0
  Filled 2022-05-31: qty 2, 28d supply, fill #0
  Filled 2022-06-25: qty 2, 28d supply, fill #1
  Filled 2022-08-30: qty 2, 28d supply, fill #0
  Filled 2022-10-01 – 2022-10-04 (×3): qty 2, 28d supply, fill #1
  Filled 2022-10-27 (×2): qty 2, 28d supply, fill #2
  Filled 2022-11-27: qty 2, 28d supply, fill #3

## 2022-06-02 ENCOUNTER — Other Ambulatory Visit (HOSPITAL_COMMUNITY): Payer: Self-pay

## 2022-06-05 ENCOUNTER — Other Ambulatory Visit (HOSPITAL_COMMUNITY): Payer: Self-pay

## 2022-06-09 ENCOUNTER — Other Ambulatory Visit (HOSPITAL_COMMUNITY): Payer: Self-pay

## 2022-06-14 ENCOUNTER — Other Ambulatory Visit (HOSPITAL_COMMUNITY): Payer: Self-pay

## 2022-06-21 ENCOUNTER — Telehealth: Payer: Self-pay | Admitting: Pharmacist

## 2022-06-21 NOTE — Telephone Encounter (Signed)
The Prior Authorization Department is unable to review this request at this time. Our records indicate that this is not one of our members. Please review the member's insurance demographics and submit to the appropriate party. Thank you in advance.

## 2022-06-21 NOTE — Telephone Encounter (Addendum)
Pa updated with new insurance.  Key: [place] already on file for this request. Authorization starting on 08/28/2021 and ending on 08/28/2023.

## 2022-06-21 NOTE — Telephone Encounter (Signed)
-----   Message from Ramond Dial, [place] sent at 06/20/2022  5:05 PM EDT ----- Regarding: FW: pa  ----- Message ----- From: Charlann Boxer, CPhT Sent: 06/19/2022  12:04 PM EDT To: Leeroy Bock, RPH-CPP Subject: pa                                             Key [id number]

## 2022-06-26 ENCOUNTER — Other Ambulatory Visit (HOSPITAL_COMMUNITY): Payer: Self-pay

## 2022-06-29 ENCOUNTER — Telehealth (HOSPITAL_BASED_OUTPATIENT_CLINIC_OR_DEPARTMENT_OTHER): Payer: Self-pay

## 2022-06-29 NOTE — Telephone Encounter (Signed)
Pt called and LVM she can no longer afford the Trulic[place], Please advise. I can call her back   [facility]

## 2022-07-04 NOTE — Telephone Encounter (Signed)
Please call patient and ask if there is a preferred formulation of medication on her insurance for Diabetes. We can always transition her to another formulation if covered.

## 2022-07-06 ENCOUNTER — Telehealth: Payer: Self-pay | Admitting: *Deleted

## 2022-07-06 NOTE — Telephone Encounter (Signed)
Called and left the patient a message to call the office back, need to move her appt for tomorrow

## 2022-07-07 ENCOUNTER — Inpatient Hospital Stay: Payer: Medicare HMO | Admitting: Gynecologic Oncology

## 2022-07-07 DIAGNOSIS — C541 Malignant neoplasm of endometrium: Secondary | ICD-10-CM

## 2022-07-10 ENCOUNTER — Other Ambulatory Visit (HOSPITAL_COMMUNITY): Payer: Self-pay

## 2022-07-11 ENCOUNTER — Ambulatory Visit: Payer: Medicare HMO | Admitting: Gynecologic Oncology

## 2022-07-12 ENCOUNTER — Ambulatory Visit: Payer: Self-pay

## 2022-07-12 NOTE — Telephone Encounter (Signed)
  Chief Complaint: leg injury Symptoms: fall d/t ankles giving out and injury to bilat legs from knees down , pt unable to apply full weight on LE. Pain 7/10.  Frequency: Monday  Pertinent Negatives: NA Disposition: '[]'$ ED /'[x]'$ Urgent Care (no appt availability in office) / '[]'$ Appointment(In office/virtual)/ '[]'$  [facility]/ '[]'$ Home Care/ '[]'$ Refused Recommended Disposition /'[]'$ [facility]/ '[]'$  Follow-up with PCP Additional Notes: pt states she fell Monday morning and hit her knees but now bilat LE hurt and unable to get around walking so been in bed for 2 days and has swelling as well. Tried to call PCP but she has left [facility]. Advised pt that UC would be best since NPA could be weeks to months out. Scheduled UC appt tomorrow at 1000 per pt request.   Reason for Disposition  [1] SEVERE pain (e.g., excruciating pain, unable to do any normal activities)  AND [2] not improved 2 hours after pain medicine/ice packs  Answer Assessment - Initial Assessment Questions 1. MECHANISM: "How did the injury happen?" (e.g., twisting injury, direct blow)      Fall where ankles gave out on her 2. ONSET: "When did the injury happen?" (Minutes or hours ago)      Monday 3. LOCATION: "Where is the injury located?"      Bilat legs from knee down  5. SEVERITY: "Can you put weight on that leg?" "Can you walk?"      Unable to put full weight  6. SIZE: For cuts, bruises, or swelling, ask: "How large is it?" (e.g., inches or centimeters)      Slight swelling, bruising  7. PAIN: "Is there pain?" If Yes, ask: "How bad is the pain?"   "What does it keep you from doing?" (e.g., Scale 1-10; or mild, moderate, severe)   -  NONE: (0): no pain   -  MILD (1-3): doesn't interfere with normal activities    -  MODERATE (4-7): interferes with normal activities (e.g., work or school) or awakens from sleep, limping    -  SEVERE (8-10): excruciating pain, unable to do any normal activities, unable to walk     Standing and  walking 7  Protocols used: Leg Injury-A-AH

## 2022-07-13 ENCOUNTER — Other Ambulatory Visit (HOSPITAL_COMMUNITY): Payer: Self-pay

## 2022-07-13 ENCOUNTER — Encounter (HOSPITAL_COMMUNITY): Payer: Self-pay

## 2022-07-13 ENCOUNTER — Encounter: Payer: Self-pay | Admitting: Gynecologic Oncology

## 2022-07-13 ENCOUNTER — Ambulatory Visit (INDEPENDENT_AMBULATORY_CARE_PROVIDER_SITE_OTHER): Payer: Medicare HMO

## 2022-07-13 ENCOUNTER — Ambulatory Visit (HOSPITAL_COMMUNITY)
Admission: RE | Admit: 2022-07-13 | Discharge: 2022-07-13 | Disposition: A | Discharge status: Home / Self Care | Payer: Medicare HMO | Source: Ambulatory Visit | Attending: Internal Medicine | Admitting: Internal Medicine

## 2022-07-13 VITALS — BP 104/75 | HR 91 | Temp 98.0°F | Resp 18

## 2022-07-13 DIAGNOSIS — M25571 Pain in right ankle and joints of right foot: Secondary | ICD-10-CM | POA: Diagnosis not present

## 2022-07-13 DIAGNOSIS — W19XXXA Unspecified fall, initial encounter: Secondary | ICD-10-CM | POA: Diagnosis not present

## 2022-07-13 DIAGNOSIS — M1712 Unilateral primary osteoarthritis, left knee: Secondary | ICD-10-CM | POA: Diagnosis not present

## 2022-07-13 DIAGNOSIS — M25562 Pain in left knee: Secondary | ICD-10-CM

## 2022-07-13 DIAGNOSIS — S82831A Other fracture of upper and lower end of right fibula, initial encounter for closed fracture: Secondary | ICD-10-CM

## 2022-07-13 MED ORDER — NAPROXEN 375 MG PO TABS
375.0000 mg | ORAL_TABLET | Freq: Two times a day (BID) | ORAL | 0 refills | Status: DC
  Filled 2022-07-13: qty 20, 10d supply, fill #0

## 2022-07-13 NOTE — Progress Notes (Signed)
Orthopedic Tech Progress Note Patient Details:  Terri Heath [birth year] [id number]  Ortho Devices Type of Ortho Device: Post (short leg) splint, Stirrup splint Ortho Device/Splint Location: LLE Ortho Device/Splint Interventions: Adjustment, Application, Ordered   Post Interventions Patient Tolerated: Well Instructions Provided: Poper ambulation with device, Care of device  Terri Heath 07/13/2022, 2:41 PM

## 2022-07-13 NOTE — ED Triage Notes (Signed)
Pt fell on Monday at her home her feet gave out and she fell on both legs. She complains of bilateral leg pain and swelling from her knees down. She fell on concrete. She is taking Aleve

## 2022-07-13 NOTE — Discharge Instructions (Signed)
Please take medications as prescribed Follow-up with your sports medicine physician next week If you have worsening pain, foot swelling, calf pain, shortness of breath or chest pain please come to urgent care to be reevaluated.

## 2022-07-13 NOTE — ED Provider Notes (Addendum)
Terri Heath    CSN: [id number] Arrival date & time: 07/13/22  0945      History   Chief Complaint Chief Complaint  Patient presents with   Leg Injury    fall on 07/10/22, bilat leg pain - Entered by patient   Fall    HPI Terri Heath is a 66 y.o. female comes to urgent care accompanied by her husband.  Patient fell 3 days ago.  She felt weak in her lower extremities and fell resulting pain involving the right ankle and the left knee.  Patient denied hitting her head.  She did not lose consciousness.  Patient is a diabetic on oral hypoglycemic agents.  She had been taking her insulin at the time of the event as she felt shaky and weak.  She did not take her blood pressures after a fall.  She went on to eat and subsequently felt much better.  She denies any episodes of hypoglycemia in the past.  Over the past 3 days the pain in the right ankle and the left knee has been worsening.  Pain is sharp and throbbing.  Currently moderately severe and aggravated by bearing weight on the right ankle and the left knee.  No known relieving factors.  Right ankle is mildly swollen and left knee has a bruise.  No numbness or tingling noted.Marland Kitchen   HPI  Past Medical History:  Diagnosis Date   Acute systolic heart failure (HCC)    Allergy    Asthma    Bilateral shoulder pain    Borderline hypertension    Bronchitis    CAD (coronary artery disease)    Chronic systolic CHF (congestive heart failure) (HCC)    Chronic systolic congestive heart failure, NYHA class 3 ([place]) 07/24/2018   Diabetes mellitus    Diagnosed in 2000, on insulin, Trulicity and metformin, not checking cgs at home   Gangrenous appendicitis with perforation s/p lap appendectomy 10/04/2017 10/04/2017   GERD (gastroesophageal reflux disease)    History of MI (myocardial infarction)    History of radiation therapy    Uterus- 12/14/21-02/01/22- Dr. Gery Pray   Hyperlipidemia    Hypertension    Hypothyroidism    MGUS  (monoclonal gammopathy of unknown significance)    Mitral regurgitation    Myocardial infarction ([place])    Neuropathy    Obesity    Sleep apnea    uses CPAP   Statin intolerance    Uterine cancer [facility])     Patient Active Problem List   Diagnosis Date Noted   Hypomagnesemia 01/17/2022   Pancytopenia, acquired ([place]) 01/03/2022   Dehydration 12/20/2021   Elevated serum creatinine 12/20/2021   Drug-induced hypotension 12/20/2021   Diabetic peripheral neuropathy associated with type 2 diabetes mellitus ([place]) 12/02/2021   Endometrial cancer ([place]) 11/07/2021   Abnormal vaginal bleeding in postmenopausal patient 10/11/2021   Encounter to establish care 01/06/2021   Vitamin D deficiency 01/06/2021   Rash of back 01/06/2021   Claudication in peripheral vascular disease ([place]) 17/49/4496   Chronic systolic CHF (congestive heart failure), NYHA class 3 ([place]) 07/24/2018   Acquired hypothyroidism 07/24/2018   Cardiomyopathy, ischemic 07/24/2018   MGUS (monoclonal gammopathy of unknown significance) 03/06/2018   Dyspnea 01/14/2018   Allergic rhinitis 06/11/2014   Asthma due to seasonal allergies 10/24/2012   OSA (obstructive sleep apnea) 05/23/2011   Poorly controlled type 2 diabetes mellitus with circulatory disorder ([place]) 03/26/2007   Hyperlipidemia 03/26/2007   Obesity, Class III, BMI 40-49.9 (morbid obesity) (  [place]) 03/26/2007   Essential hypertension 03/26/2007    Past Surgical History:  Procedure Laterality Date   BREATH TEK H PYLORI  09/18/2011   Procedure: BREATH TEK H PYLORI;  Surgeon: Shann Medal, MD;  Location: [facility];  Service: General;  Laterality: N/A;   CESAREAN SECTION     x 3   IR IMAGING GUIDED PORT INSERTION  12/07/2021   IR REMOVAL TUN ACCESS W/ PORT W/O FL MOD SED  04/04/2022   LAPAROSCOPIC APPENDECTOMY N/A 10/03/2017   Procedure: APPENDECTOMY LAPAROSCOPIC;  Surgeon: Leighton Ruff, MD;  Location: [facility];  Service: General;  Laterality: N/A;   OPERATIVE ULTRASOUND  N/A 02/01/2022   Procedure: OPERATIVE ULTRASOUND;  Surgeon: Gery Pray, MD;  Location: [facility];  Service: Urology;  Laterality: N/A;   RIGHT/LEFT HEART CATH AND CORONARY ANGIOGRAPHY N/A 02/12/2018   Procedure: RIGHT/LEFT HEART CATH AND CORONARY ANGIOGRAPHY;  Surgeon: Jettie Booze, MD;  Location: [facility];  Service: Cardiovascular;  Laterality: N/A;   ROBOTIC ASSISTED TOTAL HYSTERECTOMY WITH BILATERAL SALPINGO OOPHERECTOMY Bilateral 03/21/2022   Procedure: XI ROBOTIC ASSISTED TOTAL HYSTERECTOMY WITH BILATERAL SALPINGO OOPHORECTOMY, LYSIS OF ADHESIONS, CYSTOSCOPY;  Surgeon: [name], MD;  Location: [facility];  Service: Gynecology;  Laterality: Bilateral;   TANDEM RING INSERTION N/A 02/01/2022   Procedure: TANDEM RING INSERTION;  Surgeon: Gery Pray, MD;  Location: [facility];  Service: Urology;  Laterality: N/A;   TUBAL LIGATION      OB History     Gravida  3   Para  3   Term      Preterm      AB      Living         SAB      IAB      Ectopic      Multiple      Live Births               Home Medications    Prior to Admission medications   Medication Sig Start Date End Date Taking? Authorizing Provider  acetaminophen (TYLENOL) 500 MG tablet Take 1,000 mg by mouth every 6 (six) hours as needed for moderate pain.   Yes [provider]  albuterol (PROAIR HFA) 108 (90 Base) MCG/ACT inhaler Inhale 2 puffs into the lungs every 6 (six) hours as needed. wheezing 02/21/19  Yes Rigoberto Noel, MD  aspirin 81 MG chewable tablet Chew 1 tablet (81 mg total) by mouth daily. 09/30/21  Yes Larey Dresser, MD  blood glucose meter kit and supplies Use up to four times daily as directed. (FOR ICD-10 E10.9, E11.9). 02/10/22  Yes Early, Coralee Pesa, NP  carvedilol (COREG) 6.25 MG tablet Take 1 tablet (6.25 mg total) by mouth 2 (two) times daily. 04/03/22  Yes Larey Dresser, MD  diphenhydrAMINE (BENADRYL) 25 MG tablet Take 25 mg by mouth at bedtime.   Yes [provider]  Dulaglutide (TRULICITY) 3 LG/9.2JJ SOPN Inject 3 mg into the skin once a week. 05/31/22  Yes Early, Coralee Pesa, NP  Evolocumab (REPATHA SURECLICK) 941 MG/ML SOAJ Inject 1 pen into the skin every 14 (fourteen) days. 05/29/22  Yes Jettie Booze, MD  furosemide (LASIX) 20 MG tablet Take 1 tablet (20 mg total) by mouth daily. 05/16/22  Yes Larey Dresser, MD  glucose blood test strip Use as directed to test blood sugar 02/10/22  Yes Early, Coralee Pesa, NP  Insulin Pen Needle (UNIFINE PENTIPS) 32G X 4 MM  MISC USE AS DIRECTED ONCE A DAY 11/02/21 11/02/22 Yes Early, Coralee Pesa, NP  levothyroxine (SYNTHROID) 50 MCG tablet Take 1 tablet (50 mcg total) by mouth daily before a meal 02/14/22  Yes   magnesium oxide (MAG-OX) 400 (240 Mg) MG tablet Take 1 tablet (400 mg total) by mouth daily. 01/17/22  Yes Heath Lark, MD  metFORMIN (GLUCOPHAGE) 1000 MG tablet Take 1 tablet (1,000 mg total) by mouth 2 (two) times daily with a meal. 02/14/22  Yes   Multiple Vitamin (MULITIVITAMIN WITH MINERALS) TABS Take 1 tablet by mouth daily with breakfast.   Yes [provider]  naproxen (NAPROSYN) 375 MG tablet Take 1 tablet (375 mg total) by mouth 2 (two) times daily. 07/13/22  Yes Kenniel Bergsma, Myrene Galas, MD  NON FORMULARY Pt uses a cpap nightly   Yes [provider]  OneTouch Delica Lancets 73A MISC use as directed 01/21/21  Yes Early, Coralee Pesa, NP  sacubitril-valsartan (ENTRESTO) 49-51 MG Take 1 tablet by mouth 2 (two) times daily. 01/19/22  Yes Larey Dresser, MD  senna-docusate (SENOKOT-S) 8.6-50 MG tablet Take 2 tablets by mouth at bedtime. For AFTER surgery, do not take if having diarrhea 03/06/22  Yes Cross, Melissa D, NP  spironolactone (ALDACTONE) 25 MG tablet Take 1 tablet (25 mg total) by mouth daily. 02/03/22  Yes Larey Dresser, MD  triamcinolone ointment (KENALOG) 0.1 % Apply 1 application topically two times daily to affected area(s) as needed, sparing use to avoid whitening/thinning skin 01/06/21  Yes  Early, Coralee Pesa, NP  insulin glargine (LANTUS) 100 UNIT/ML Solostar Pen Inject 25-40 Units into the skin at bedtime based on blood sugar readings and adjust via sliding scale. 03/07/22   Orma Render, NP  insulin detemir (LEVEMIR) 100 UNIT/ML FlexPen Inject 10 Units into the skin daily. 09/20/20 10/26/20  Just, Laurita Quint, FNP    Family History Family History  Problem Relation Age of Onset   Alcohol abuse Mother    Arthritis Mother    Diabetes Mother    Sleep apnea Mother    Anxiety disorder Mother    Eating disorder Mother    Obesity Mother    Cancer Father    Alcohol abuse Father    Heart disease Father    Hypertension Father    Stroke Father    Sleep apnea Sister    Breast cancer Neg Hx    Colon cancer Neg Hx    Ovarian cancer Neg Hx    Endometrial cancer Neg Hx    Pancreatic cancer Neg Hx    Prostate cancer Neg Hx     Social History Social History   Tobacco Use   Smoking status: Former    Packs/day: 1.00    Years: 15.00    Total pack years: 15.00    Types: Cigarettes    Quit date: 01/28/1990    Years since quitting: 32.4   Smokeless tobacco: Never  Vaping Use   Vaping Use: Never used  Substance Use Topics   Alcohol use: Not Currently   Drug use: No     Allergies   Amoxicillin, Adhesive [tape], Exenatide, Invokana [canagliflozin], Jardiance [empagliflozin], Lisinopril, Other, and Statins   Review of Systems Review of Systems As perHPI    Physical Exam Triage Vital Signs ED Triage Vitals  Enc Vitals Group     BP 07/13/22 1041 104/75     Pulse Rate 07/13/22 1041 91     Resp 07/13/22 1041 18     Temp  07/13/22 1041 98 F (36.7 C)     Temp Source 07/13/22 1041 Oral     SpO2 07/13/22 1041 99 %     Weight --      Height --      Head Circumference --      Peak Flow --      Pain Score 07/13/22 1039 6     Pain Loc --      Pain Edu? --      Excl. in [place]? --    No data found.  Updated Vital Signs BP 104/75 (BP Location: Right Arm)   Pulse 91   Temp 98  F (36.7 C) (Oral)   Resp 18   SpO2 99%   Visual Acuity Right Eye Distance:   Left Eye Distance:   Bilateral Distance:    Right Eye Near:   Left Eye Near:    Bilateral Near:     Physical Exam Vitals and nursing note reviewed.  Constitutional:      General: She is in acute distress.     Appearance: Normal appearance.  Cardiovascular:     Rate and Rhythm: Normal rate and regular rhythm.     Pulses: Normal pulses.     Heart sounds: Normal heart sounds.  Musculoskeletal:     Comments: Mild swelling over the lateral malleolus of the right ankle.  Tenderness on palpation of the lateral malleolus of the right ankle.  Range of motion is limited secondary to pain.  Full range of motion of both knees with tenderness on palpation over the anterior aspect of the left knee.  No joint effusion.  Neurological:     Mental Status: She is alert.      UC Treatments / Results  Labs (all labs ordered are listed, but only abnormal results are displayed) Labs Reviewed - No data to display  EKG   Radiology No results found.  Procedures Procedures (including critical care time)  Medications Ordered in UC Medications - No data to display  Initial Impression / Assessment and Plan / UC Course  I have reviewed the triage vital signs and the nursing notes.  Pertinent labs & imaging results that were available during my care of the patient were reviewed by me and considered in my medical decision making (see chart for details).     1.  Closed fracture of the distal right fibula: X-ray of the right ankle is remarkable for mildly displaced fracture of the distal left fibula No fracture seen on the right knee. Posterior splint with stirrup Ibuprofen as needed for pain Elevation of the right leg Follow-up with sports medicine Range of motion exercises of the left knee Pain management as above Return precautions given. Final Clinical Impressions(s) / UC Diagnoses   Final diagnoses:   Other closed fracture of distal end of right fibula, initial encounter     Discharge Instructions      Please take medications as prescribed Follow-up with your sports medicine physician next week If you have worsening pain, foot swelling, calf pain, shortness of breath or chest pain please come to urgent care to be reevaluated.     ED Prescriptions     Medication Sig Dispense Auth. Provider   naproxen (NAPROSYN) 375 MG tablet Take 1 tablet (375 mg total) by mouth 2 (two) times daily. 20 tablet Ora Mcnatt, Myrene Galas, MD      PDMP not reviewed this encounter.   Chase Picket, MD 07/15/22 1538    Lanny Cramp Myrene Galas,  MD 07/15/22 1540

## 2022-07-13 NOTE — Medical Student Note (Signed)
[facility] Statistician Note For educational purposes for Medical, PA and NP students only and not part of the legal medical record.   CSN: [id number] Arrival date & time: 07/13/22  0945      History   Chief Complaint Chief Complaint  Patient presents with   Leg Injury    fall on 07/10/22, bilat leg pain - Entered by patient   Fall    HPI Terri Heath is a 66 y.o. female.  Charis presents to urgent care today following a fall that occurred on 07/10/2022. She had been sitting on an outdoor patio, when she got up to walk inside. On her way in, she states that both of her knees gave out from underneath her, she fell, landing on her left knee and twisting her right ankle. She felt a continued weakness in bilateral lower extremities, to where she could not walk and had to pull herself onto the couch. Once her husband got home, he was able to assist her with walking, and got her into bed. Since then, she has continued to need support with walking, requiring something to support her whenever she gets up. Reports the weakness begins in bilateral knees and continues into both lower extremities. Patient states she hadn't eat lunch yet that day, and was unsure whether her blood sugars were contributing to this. Denies any fever, headache, dizziness, syncope, visual disturbances, speech disturbances, facial weakness, slurred speech, upper extremity weakness, back pain, CP, SOB, abdominal pain, N/V/D, new numbness or tingling. Endorses current right ankle pain, swelling, and bruising, with left knee pain and tenderness.    Fall    Past Medical History:  Diagnosis Date   Acute systolic heart failure (HCC)    Allergy    Asthma    Bilateral shoulder pain    Borderline hypertension    Bronchitis    CAD (coronary artery disease)    Chronic systolic CHF (congestive heart failure) (HCC)    Chronic systolic congestive heart failure, NYHA class 3 ([place]) 07/24/2018    Diabetes mellitus    Diagnosed in 2000, on insulin, Trulicity and metformin, not checking cgs at home   Gangrenous appendicitis with perforation s/p lap appendectomy 10/04/2017 10/04/2017   GERD (gastroesophageal reflux disease)    History of MI (myocardial infarction)    History of radiation therapy    Uterus- 12/14/21-02/01/22- Dr. Gery Pray   Hyperlipidemia    Hypertension    Hypothyroidism    MGUS (monoclonal gammopathy of unknown significance)    Mitral regurgitation    Myocardial infarction ([place])    Neuropathy    Obesity    Sleep apnea    uses CPAP   Statin intolerance    Uterine cancer [facility])     Patient Active Problem List   Diagnosis Date Noted   Hypomagnesemia 01/17/2022   Pancytopenia, acquired ([place]) 01/03/2022   Dehydration 12/20/2021   Elevated serum creatinine 12/20/2021   Drug-induced hypotension 12/20/2021   Diabetic peripheral neuropathy associated with type 2 diabetes mellitus ([place]) 12/02/2021   Endometrial cancer ([place]) 11/07/2021   Abnormal vaginal bleeding in postmenopausal patient 10/11/2021   Encounter to establish care 01/06/2021   Vitamin D deficiency 01/06/2021   Rash of back 01/06/2021   Claudication in peripheral vascular disease ([place]) 07/37/1062   Chronic systolic CHF (congestive heart failure), NYHA class 3 ([place]) 07/24/2018   Acquired hypothyroidism 07/24/2018   Cardiomyopathy, ischemic 07/24/2018   MGUS (monoclonal gammopathy of unknown significance) 03/06/2018   Dyspnea 01/14/2018  Allergic rhinitis 06/11/2014   Asthma due to seasonal allergies 10/24/2012   OSA (obstructive sleep apnea) 05/23/2011   Poorly controlled type 2 diabetes mellitus with circulatory disorder ([place]) 03/26/2007   Hyperlipidemia 03/26/2007   Obesity, Class III, BMI 40-49.9 (morbid obesity) ([place]) 03/26/2007   Essential hypertension 03/26/2007    Past Surgical History:  Procedure Laterality Date   BREATH TEK H PYLORI  09/18/2011   Procedure: BREATH TEK H PYLORI;   Surgeon: Shann Medal, MD;  Location: [facility];  Service: General;  Laterality: N/A;   CESAREAN SECTION     x 3   IR IMAGING GUIDED PORT INSERTION  12/07/2021   IR REMOVAL TUN ACCESS W/ PORT W/O FL MOD SED  04/04/2022   LAPAROSCOPIC APPENDECTOMY N/A 10/03/2017   Procedure: APPENDECTOMY LAPAROSCOPIC;  Surgeon: Leighton Ruff, MD;  Location: [facility];  Service: General;  Laterality: N/A;   OPERATIVE ULTRASOUND N/A 02/01/2022   Procedure: OPERATIVE ULTRASOUND;  Surgeon: Gery Pray, MD;  Location: [facility];  Service: Urology;  Laterality: N/A;   RIGHT/LEFT HEART CATH AND CORONARY ANGIOGRAPHY N/A 02/12/2018   Procedure: RIGHT/LEFT HEART CATH AND CORONARY ANGIOGRAPHY;  Surgeon: Jettie Booze, MD;  Location: [facility];  Service: Cardiovascular;  Laterality: N/A;   ROBOTIC ASSISTED TOTAL HYSTERECTOMY WITH BILATERAL SALPINGO OOPHERECTOMY Bilateral 03/21/2022   Procedure: XI ROBOTIC ASSISTED TOTAL HYSTERECTOMY WITH BILATERAL SALPINGO OOPHORECTOMY, LYSIS OF ADHESIONS, CYSTOSCOPY;  Surgeon: [name], MD;  Location: [facility];  Service: Gynecology;  Laterality: Bilateral;   TANDEM RING INSERTION N/A 02/01/2022   Procedure: TANDEM RING INSERTION;  Surgeon: Gery Pray, MD;  Location: [facility];  Service: Urology;  Laterality: N/A;   TUBAL LIGATION      OB History     Gravida  3   Para  3   Term      Preterm      AB      Living         SAB      IAB      Ectopic      Multiple      Live Births               Home Medications    Prior to Admission medications   Medication Sig Start Date End Date Taking? Authorizing Provider  acetaminophen (TYLENOL) 500 MG tablet Take 1,000 mg by mouth every 6 (six) hours as needed for moderate pain.   Yes [provider]  albuterol (PROAIR HFA) 108 (90 Base) MCG/ACT inhaler Inhale 2 puffs into the lungs every 6 (six) hours as needed. wheezing 02/21/19  Yes Rigoberto Noel, MD  aspirin 81 MG chewable tablet Chew 1 tablet  (81 mg total) by mouth daily. 09/30/21  Yes Larey Dresser, MD  blood glucose meter kit and supplies Use up to four times daily as directed. (FOR ICD-10 E10.9, E11.9). 02/10/22  Yes Early, Coralee Pesa, NP  carvedilol (COREG) 6.25 MG tablet Take 1 tablet (6.25 mg total) by mouth 2 (two) times daily. 04/03/22  Yes Larey Dresser, MD  diphenhydrAMINE (BENADRYL) 25 MG tablet Take 25 mg by mouth at bedtime.   Yes [provider]  Dulaglutide (TRULICITY) 3 ZS/0.1UX SOPN Inject 3 mg into the skin once a week. 05/31/22  Yes Early, Coralee Pesa, NP  Evolocumab (REPATHA SURECLICK) 323 MG/ML SOAJ Inject 1 pen into the skin every 14 (fourteen) days. 05/29/22  Yes Jettie Booze, MD  furosemide (LASIX) 20 MG tablet Take  1 tablet (20 mg total) by mouth daily. 05/16/22  Yes Larey Dresser, MD  glucose blood test strip Use as directed to test blood sugar 02/10/22  Yes Early, Coralee Pesa, NP  Insulin Pen Needle (UNIFINE PENTIPS) 32G X 4 MM MISC USE AS DIRECTED ONCE A DAY 11/02/21 11/02/22 Yes Early, Coralee Pesa, NP  levothyroxine (SYNTHROID) 50 MCG tablet Take 1 tablet (50 mcg total) by mouth daily before a meal 02/14/22  Yes   magnesium oxide (MAG-OX) 400 (240 Mg) MG tablet Take 1 tablet (400 mg total) by mouth daily. 01/17/22  Yes Heath Lark, MD  metFORMIN (GLUCOPHAGE) 1000 MG tablet Take 1 tablet (1,000 mg total) by mouth 2 (two) times daily with a meal. 02/14/22  Yes   Multiple Vitamin (MULITIVITAMIN WITH MINERALS) TABS Take 1 tablet by mouth daily with breakfast.   Yes [provider]  NON FORMULARY Pt uses a cpap nightly   Yes [provider]  OneTouch Delica Lancets 96G MISC use as directed 01/21/21  Yes Early, Coralee Pesa, NP  sacubitril-valsartan (ENTRESTO) 49-51 MG Take 1 tablet by mouth 2 (two) times daily. 01/19/22  Yes Larey Dresser, MD  senna-docusate (SENOKOT-S) 8.6-50 MG tablet Take 2 tablets by mouth at bedtime. For AFTER surgery, do not take if having diarrhea 03/06/22  Yes Cross, Melissa D, NP   spironolactone (ALDACTONE) 25 MG tablet Take 1 tablet (25 mg total) by mouth daily. 02/03/22  Yes Larey Dresser, MD  triamcinolone ointment (KENALOG) 0.1 % Apply 1 application topically two times daily to affected area(s) as needed, sparing use to avoid whitening/thinning skin 01/06/21  Yes Early, Coralee Pesa, NP  insulin glargine (LANTUS) 100 UNIT/ML Solostar Pen Inject 25-40 Units into the skin at bedtime based on blood sugar readings and adjust via sliding scale. 03/07/22   Orma Render, NP  insulin detemir (LEVEMIR) 100 UNIT/ML FlexPen Inject 10 Units into the skin daily. 09/20/20 10/26/20  Just, Laurita Quint, FNP    Family History Family History  Problem Relation Age of Onset   Alcohol abuse Mother    Arthritis Mother    Diabetes Mother    Sleep apnea Mother    Anxiety disorder Mother    Eating disorder Mother    Obesity Mother    Cancer Father    Alcohol abuse Father    Heart disease Father    Hypertension Father    Stroke Father    Sleep apnea Sister    Breast cancer Neg Hx    Colon cancer Neg Hx    Ovarian cancer Neg Hx    Endometrial cancer Neg Hx    Pancreatic cancer Neg Hx    Prostate cancer Neg Hx     Social History Social History   Tobacco Use   Smoking status: Former    Packs/day: 1.00    Years: 15.00    Total pack years: 15.00    Types: Cigarettes    Quit date: 01/28/1990    Years since quitting: 32.4   Smokeless tobacco: Never  Vaping Use   Vaping Use: Never used  Substance Use Topics   Alcohol use: Not Currently   Drug use: No     Allergies   Amoxicillin, Adhesive [tape], Exenatide, Invokana [canagliflozin], Jardiance [empagliflozin], Lisinopril, Other, and Statins   Review of Systems Review of Systems  All other systems reviewed and are negative.  ROS in HPI   Physical Exam Updated Vital Signs BP 104/75 (BP Location: Right Arm)   Pulse  91   Temp 98 F (36.7 C) (Oral)   Resp 18   SpO2 99%   Physical Exam Vitals and nursing note reviewed.   Constitutional:      Appearance: She is obese.  HENT:     Head: Atraumatic.  Eyes:     Extraocular Movements: Extraocular movements intact.     Pupils: Pupils are equal, round, and reactive to light.  Cardiovascular:     Rate and Rhythm: Normal rate and regular rhythm.     Heart sounds: Normal heart sounds.  Pulmonary:     Effort: Pulmonary effort is normal.     Breath sounds: Normal breath sounds.  Musculoskeletal:        General: Swelling present. Normal range of motion.     Right lower leg: Edema present.     Left lower leg: Edema present.     Comments: Bruising, swelling, tenderness to anterior right ankle. Focal bruising and tenderness to left knee, no visible swelling. No redness, lesion, obvious deformities to either lower extremity.   Neurological:     General: No focal deficit present.     Mental Status: She is alert and oriented to person, place, and time. Mental status is at baseline.     Sensory: No sensory deficit.     Motor: No weakness.     Coordination: Coordination normal.     Gait: Gait abnormal (Antalgic gait).     Deep Tendon Reflexes: Reflexes normal.     Comments: Antalgic gait. Strength 4+/5 bilateral lower extremities.       ED Treatments / Results  Labs (all labs ordered are listed, but only abnormal results are displayed) Labs Reviewed - No data to display  EKG  Radiology No results found.  Procedures Procedures (including critical care time)  Medications Ordered in ED Medications - No data to display   Initial Impression / Assessment and Plan / ED Course  I have reviewed the triage vital signs and the nursing notes.  Pertinent labs & imaging results that were available during my care of the patient were reviewed by me and considered in my medical decision making (see chart for details).     Weakness is likely due to pain and neuropathy in bilateral lower extremities. Imaging today showed a fracture to your right lower leg. Please  follow up with PCP regarding progressing neuropathy and ortho for your fractured ankle. Discussed ER protocol with patient and stressed need for follow up there with any new, worsening, or concerning symptoms.  Final Clinical Impressions(s) / ED Diagnoses   Final diagnoses:  None    New Prescriptions New Prescriptions   No medications on file

## 2022-07-14 ENCOUNTER — Inpatient Hospital Stay: Payer: Medicare HMO | Admitting: Gynecologic Oncology

## 2022-07-14 DIAGNOSIS — C541 Malignant neoplasm of endometrium: Secondary | ICD-10-CM

## 2022-07-17 ENCOUNTER — Other Ambulatory Visit (HOSPITAL_COMMUNITY): Payer: Self-pay

## 2022-07-17 ENCOUNTER — Encounter (HOSPITAL_BASED_OUTPATIENT_CLINIC_OR_DEPARTMENT_OTHER): Payer: Self-pay | Admitting: Family Medicine

## 2022-07-17 ENCOUNTER — Ambulatory Visit (INDEPENDENT_AMBULATORY_CARE_PROVIDER_SITE_OTHER): Payer: Medicare HMO | Admitting: Family Medicine

## 2022-07-17 VITALS — BP 112/92 | HR 92 | Ht 67.0 in | Wt 289.3 lb

## 2022-07-17 DIAGNOSIS — S82839A Other fracture of upper and lower end of unspecified fibula, initial encounter for closed fracture: Secondary | ICD-10-CM

## 2022-07-17 DIAGNOSIS — S82831A Other fracture of upper and lower end of right fibula, initial encounter for closed fracture: Secondary | ICD-10-CM

## 2022-07-17 HISTORY — DX: Other fracture of upper and lower end of unspecified fibula, initial encounter for closed fracture: S82.839A

## 2022-07-17 NOTE — Progress Notes (Signed)
    Procedures performed today:    None.  Independent interpretation of notes and tests performed by another provider:   None.  Brief History, Exam, Impression, and Recommendations:    BP (!) 112/92   Pulse 92   Ht '5\' 7"'$  (1.702 m)   Wt 289 lb 4.8 oz (131.2 kg)   SpO2 100%   BMI 45.31 kg/m   Closed fracture of distal fibula Patient presented to local urgent care about 4 days ago due to right leg/ankle pain.  She indicates that about 1 week ago, she fell while holding her grandchild and injured her right ankle.  At urgent care, they did perform imaging and found that patient had mildly displaced distal fibula fracture.  They placed her in a splint and provided her with a walker to assist with ambulation.  Today, she reports that she still has some pain in the area but generally has been doing well.  She denies any calf pain or significant lower extremity swelling.  She has been doing some partial weightbearing, but indicates that the Ace bandage around her splint does not provide good traction.  Patient does have history of diabetes which has not been optimally controlled in the past. On exam, patient is in no acute distress, vital signs stable.  She does present with splint in place over right ankle.  Splint removed here in the office.  There is some mild bruising over anterior shin.  No tenderness to palpation over medial malleolus or deltoid ligament.  No tenderness to palpation over lateral malleolus or lateral ankle ligaments.  She does have tenderness palpation at distal fibula consistent with location of fracture.  Distal neurovascular exam is intact with normal DP and PT pulses.  No significant edema noted, no tenderness to palpation to posterior calf of right lower extremity. Reviewed imaging with patient and husband in office today and reviewed treatment recommendations.  Given mildly displaced nature of distal fibula fracture, feel that she will do well with conservative management.   We will place patient in short leg walking boot today.  She may continue with partial weightbearing as tolerated as long as she continues to utilize the boot.  She may remove the boot for cleaning the leg as well as to be able to ice and elevate.  She can also continue with OTC medications to help with pain control. Recommend returning about 3 to 4 weeks to monitor progress.  Discussed that we would likely obtain repeat x-rays around 6 to 8 weeks to monitor healing.    Return in about 3 weeks (around 08/07/2022) for Fibula fracture. Former patient of Laretta Bolster, she would like to transition care to me and remain with the office, we will look to do this at future visit as well.   ___________________________________________ Zarai Orsborn de [place], MD, ABFM, CAQSM Primary Care and [place]

## 2022-07-17 NOTE — Patient Instructions (Signed)
  Medication Instructions:  Your physician recommends that you continue on your current medications as directed. Please refer to the Current Medication list given to you today. --If you need a refill on any your medications before your next appointment, please call your pharmacy first. If no refills are authorized on file call the office.-- Lab Work: Your physician has recommended that you have lab work today:  If you have labs (blood work) drawn today and your tests are completely normal, you will receive your results via [place] a phone call from our staff.  Please ensure you check your voicemail in the event that you authorized detailed messages to be left on a delegated number. If you have any lab test that is abnormal or we need to change your treatment, we will call you to review the results.  Referrals/Procedures/Imaging:    Follow-Up: Your next appointment:   Your physician recommends that you schedule a follow-up appointment in: 3-4 weeka with Dr. de [place]  You will receive a text message or e-mail with a link to a survey about your care and experience with [place] today! We would greatly appreciate your feedback!   Thanks for letting us be apart of your health journey!!  Primary Care and Sports Medicine   Dr. Arlina Robes [place]   We encourage you to activate your patient portal called "MyChart".  Sign up information is provided on this After Visit Summary.  MyChart is used to connect with patients for Virtual Visits (Telemedicine).  Patients are able to view lab/test results, encounter notes, upcoming appointments, etc.  Non-urgent messages can be sent to your provider as well. To learn more about what you can do with MyChart, please visit --  [email or web address].

## 2022-07-17 NOTE — Assessment & Plan Note (Signed)
Patient presented to local urgent care about 4 days ago due to right leg/ankle pain.  She indicates that about 1 week ago, she fell while holding her grandchild and injured her right ankle.  At urgent care, they did perform imaging and found that patient had mildly displaced distal fibula fracture.  They placed her in a splint and provided her with a walker to assist with ambulation.  Today, she reports that she still has some pain in the area but generally has been doing well.  She denies any calf pain or significant lower extremity swelling.  She has been doing some partial weightbearing, but indicates that the Ace bandage around her splint does not provide good traction.  Patient does have history of diabetes which has not been optimally controlled in the past. On exam, patient is in no acute distress, vital signs stable.  She does present with splint in place over right ankle.  Splint removed here in the office.  There is some mild bruising over anterior shin.  No tenderness to palpation over medial malleolus or deltoid ligament.  No tenderness to palpation over lateral malleolus or lateral ankle ligaments.  She does have tenderness palpation at distal fibula consistent with location of fracture.  Distal neurovascular exam is intact with normal DP and PT pulses.  No significant edema noted, no tenderness to palpation to posterior calf of right lower extremity. Reviewed imaging with patient and husband in office today and reviewed treatment recommendations.  Given mildly displaced nature of distal fibula fracture, feel that she will do well with conservative management.  We will place patient in short leg walking boot today.  She may continue with partial weightbearing as tolerated as long as she continues to utilize the boot.  She may remove the boot for cleaning the leg as well as to be able to ice and elevate.  She can also continue with OTC medications to help with pain control. Recommend returning about 3  to 4 weeks to monitor progress.  Discussed that we would likely obtain repeat x-rays around 6 to 8 weeks to monitor healing.

## 2022-07-28 ENCOUNTER — Encounter: Payer: Self-pay | Admitting: Gynecologic Oncology

## 2022-07-28 ENCOUNTER — Inpatient Hospital Stay: Payer: Medicare HMO | Attending: Gynecologic Oncology | Admitting: Gynecologic Oncology

## 2022-07-28 VITALS — BP 103/71 | HR 96 | Temp 97.5°F | Resp 16 | Wt 287.0 lb

## 2022-07-28 DIAGNOSIS — Z90710 Acquired absence of both cervix and uterus: Secondary | ICD-10-CM | POA: Insufficient documentation

## 2022-07-28 DIAGNOSIS — Z8542 Personal history of malignant neoplasm of other parts of uterus: Secondary | ICD-10-CM | POA: Insufficient documentation

## 2022-07-28 DIAGNOSIS — Z9221 Personal history of antineoplastic chemotherapy: Secondary | ICD-10-CM | POA: Insufficient documentation

## 2022-07-28 DIAGNOSIS — Z90722 Acquired absence of ovaries, bilateral: Secondary | ICD-10-CM | POA: Diagnosis not present

## 2022-07-28 DIAGNOSIS — Z08 Encounter for follow-up examination after completed treatment for malignant neoplasm: Secondary | ICD-10-CM | POA: Insufficient documentation

## 2022-07-28 DIAGNOSIS — Z923 Personal history of irradiation: Secondary | ICD-10-CM | POA: Diagnosis not present

## 2022-07-28 DIAGNOSIS — C541 Malignant neoplasm of endometrium: Secondary | ICD-10-CM

## 2022-07-28 NOTE — Patient Instructions (Signed)
It was good to see you today.  I do not see or feel any evidence of cancer recurrence on your exam.  I will call you once I get your CT scan back.  I will see you for follow-up in 3 months.  As always, if you develop any new and concerning symptoms before your next visit, please call to see me sooner.

## 2022-07-28 NOTE — Progress Notes (Signed)
Gynecologic Oncology Return Clinic Visit  07/28/22  Reason for Visit: follow-up in the setting of endometrial cancer  Treatment History: Oncology History Overview Note  Endometrioid adenocarcinoma   Endometrial cancer ([place])  10/27/2021 Pathology Results   FINAL MICROSCOPIC DIAGNOSIS:   A. ENDOMETRIUM, BIOPSY:  - Endometrioid adenocarcinoma with focal secretory features.  - See comment.   COMMENT:  The carcinoma has endometrioid features with a few small foci with secretory features.  There are several foci of solid pattern and the findings are consistent with FIGO grade 1/2.  Immunohistochemistry shows diffuse strong positivity estrogen receptor and Napsin A is negative.  P53 shows wild-type pattern.    11/07/2021 Initial Diagnosis   Endometrial cancer ([place])   11/14/2021 Imaging   MRI pelvis 1. Thickened (18 mm) endometrium, compatible with known endometrial malignancy. Evidence of full-thickness myometrial invasion by endometrial tumor throughout the left uterine body extending over 180 degrees of involvement. Probable early small focus of direct tumor invasion into the parametrial soft tissues in the anterior left uterine body as detailed. 2. Suspect a combination of direct endometrial tumor involvement of the upper endocervical canal and blood products within the endocervix. 3. No pelvic lymphadenopathy.  No adnexal masses.       11/24/2021 Imaging   CT abdomen and pelvis No evidence of abdominal metastatic disease or other acute findings.   Mild hepatic steatosis.   Colonic diverticulosis, without radiographic evidence of diverticulitis.   Aortic Atherosclerosis (ICD10-I70.0).     12/02/2021 Cancer Staging   Staging form: Corpus Uteri - Carcinoma and Carcinosarcoma, AJCC 8th Edition - Clinical stage from 12/02/2021: FIGO Stage IIIB (cT3b, cN0, cM0) - Signed by Heath Lark, MD on 12/02/2021 Stage prefix: Initial diagnosis   12/07/2021 Procedure   Status post placement of  right IJ port catheter   12/13/2021 - 01/10/2022 Chemotherapy   Patient is on Treatment Plan : Uterine Cisplatin days 1 and 29 + XRT      12/14/2021 - 02/01/2022 Radiation Therapy   Radiation Treatment Dates: 12/14/2021 through 02/01/2022 (IMRT : 12/14/21 through 01/17/22) (Brachytherapy - Tandem Ring : 02/01/22)  Site Technique Total Dose (Gy) Dose per Fx (Gy) Completed Fx Beam Energies  Uterus: Uterus IMRT 45/45 1.8 25/25 10X  Uterus: Uterus_Bst HDR-brachy 5.5/5.5 5.5 1/1 Ir-192        01/25/2022 Imaging   MRI pelvis Decreased endometrial thickness, consistent with interval response to therapy. Persistent deep myometrial invasion (> 50%) noted in the left lateral uterine corpus. No evidence of extra-uterine tumor extension.    No evidence of pelvic metastatic disease.   Sigmoid diverticulosis, without evidence of diverticulitis.   03/21/2022 Pathology Results   FINAL MICROSCOPIC DIAGNOSIS:   A. 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, FIGO 1  Focal residual atypical hyperplasia with squamous morular metaplasia  Tumor invades greater than 50% of the myometrium (9.75 mm of 16 mm)  (ypT1b)  Margins free  Extensive adenomyosis  Background inactive endometrium  Serosal endosalpingosis  Acute and chronic cervicitis with mucosal necrosis and numerous  nabothian cysts and tunnel clusters  Benign fallopian tubes and ovaries   ONCOLOGY TABLE:   UTERUS, CARCINOMA OR CARCINOSARCOMA: Resection   Procedure: Total hysterectomy and bilateral salpingo-oophorectomy  Histologic Type: Endometrioid adenocarcinoma  Histologic Grade: Well-differentiated, FIGO 1  Myometrial Invasion:       Depth of Myometrial Invasion (mm): 9.75 mm       Myometrial Thickness (mm): 16 mm  Percentage of Myometrial Invasion: 61%  Uterine Serosa Involvement: Not identified  Cervical  stromal Involvement: Not identified  Extent of involvement of other tissue/organs: Not identified  Peritoneal/Ascitic Fluid: Not available  Lymphovascular Invasion: Not identified  Regional Lymph Nodes: Not applicable (no lymph nodes submitted or found)   Distant Metastasis: Not applicable  Pathologic Stage Classification (pTNM, AJCC 8th Edition): ypT1b, pN n/a  Ancillary Studies: MMR / MSI has been ordered and will be reported in an  addendum  Representative Tumor Block: A7  Comment(s): The anterior endomyometrium shows extensive adenomyosis and scattered intramyometrial psammomatous calcifications.  Residual invasive tumor is identified within the posterior endomyometrium.  The otherwise benign endometrial and cervical epithelium shows apparent reactive/therapy associated atypia.  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 Obesity made retroperitoneal visualization limited and increased the complexity of the case and necessitated additional instrumentation for retraction. Obesity related complexity increased the duration of the procedure by 45 minutes.    Surgeon: Jeral Pinch MD    Operative Findings:  On EUA, 10 cm mobile uterus. On intra-abdominal entry, normal upper abdominal survey.  Omentum densely adherent to anterior abdominal wall along prior midline infraumbilical incision.  Normal-appearing small and large bowel.  Difficult intra-abdominal adiposity.  Cecum somewhat adherent to the right abdominal wall secondary to prior appendectomy.  Uterus approximately 8-10 cm and somewhat  bulbous.  Normal-appearing bilateral adnexa.  No obvious adenopathy.  Some edema of the retroperitoneum.  Densely adherent bladder to the uterus and cervix from prior cesarean sections.  No gross evidence of disease. On cystoscopy, intact bladder dome. Good efflux from bilateral ureteral orifices.   04/05/2022 Procedure   Removal of implanted Port-A-Cath utilizing sharp and blunt dissection. The procedure was uncomplicated.     Interval History: Doing well.  Broke her leg recently after a fall.  Is in a Aircast.  Overall managing.  She notes having some intermittent chills.  Denies ever having a fever.  Reports good appetite.  Denies any abdominal or pelvic pain.  Reports baseline bowel bladder function.  Denies any vaginal bleeding or discharge.  Past Medical/Surgical History: Past Medical History:  Diagnosis Date   Acute systolic heart failure (HCC)    Allergy    Asthma    Bilateral shoulder pain    Borderline hypertension    Bronchitis    CAD (coronary artery disease)    Chronic systolic CHF (congestive heart failure) (HCC)    Chronic systolic congestive heart failure, NYHA class 3 ([place]) 07/24/2018   Diabetes mellitus    Diagnosed in 2000, on insulin, Trulicity and metformin, not checking cgs at home   Gangrenous appendicitis with perforation s/p lap appendectomy 10/04/2017 10/04/2017   GERD (gastroesophageal reflux disease)    History of MI (myocardial infarction)    History of radiation therapy    Uterus- 12/14/21-02/01/22- Dr. Gery Pray   Hyperlipidemia    Hypertension    Hypothyroidism    MGUS (monoclonal gammopathy of unknown significance)    Mitral regurgitation    Myocardial infarction ([place])    Neuropathy    Obesity    Sleep apnea    uses CPAP   Statin intolerance    Uterine  cancer [facility])     Past Surgical History:  Procedure Laterality Date   BREATH TEK H PYLORI  09/18/2011   Procedure: BREATH TEK H PYLORI;  Surgeon: Shann Medal, MD;  Location: [facility];   Service: General;  Laterality: N/A;   CESAREAN SECTION     x 3   IR IMAGING GUIDED PORT INSERTION  12/07/2021   IR REMOVAL TUN ACCESS W/ PORT W/O FL MOD SED  04/04/2022   LAPAROSCOPIC APPENDECTOMY N/A 10/03/2017   Procedure: APPENDECTOMY LAPAROSCOPIC;  Surgeon: Leighton Ruff, MD;  Location: [facility];  Service: General;  Laterality: N/A;   OPERATIVE ULTRASOUND N/A 02/01/2022   Procedure: OPERATIVE ULTRASOUND;  Surgeon: Gery Pray, MD;  Location: [facility];  Service: Urology;  Laterality: N/A;   RIGHT/LEFT HEART CATH AND CORONARY ANGIOGRAPHY N/A 02/12/2018   Procedure: RIGHT/LEFT HEART CATH AND CORONARY ANGIOGRAPHY;  Surgeon: Jettie Booze, MD;  Location: [facility];  Service: Cardiovascular;  Laterality: N/A;   ROBOTIC ASSISTED TOTAL HYSTERECTOMY WITH BILATERAL SALPINGO OOPHERECTOMY Bilateral 03/21/2022   Procedure: XI ROBOTIC ASSISTED TOTAL HYSTERECTOMY WITH BILATERAL SALPINGO OOPHORECTOMY, LYSIS OF ADHESIONS, CYSTOSCOPY;  Surgeon: [name], MD;  Location: [facility];  Service: Gynecology;  Laterality: Bilateral;   TANDEM RING INSERTION N/A 02/01/2022   Procedure: TANDEM RING INSERTION;  Surgeon: Gery Pray, MD;  Location: [facility];  Service: Urology;  Laterality: N/A;   TUBAL LIGATION      Family History  Problem Relation Age of Onset   Alcohol abuse Mother    Arthritis Mother    Diabetes Mother    Sleep apnea Mother    Anxiety disorder Mother    Eating disorder Mother    Obesity Mother    Cancer Father    Alcohol abuse Father    Heart disease Father    Hypertension Father    Stroke Father    Sleep apnea Sister    Breast cancer Neg Hx    Colon cancer Neg Hx    Ovarian cancer Neg Hx    Endometrial cancer Neg Hx    Pancreatic cancer Neg Hx    Prostate cancer Neg Hx     Social History   Socioeconomic History   Marital status: Married    Spouse name: Not on file   Number of children: Not on file   Years of education: Not on file   Highest education level: Not  on file  Occupational History   Occupation: retired Brewing technologist, worked at [employer]  Tobacco Use   Smoking status: Former    Packs/day: 1.00    Years: 15.00    Total pack years: 15.00    Types: Cigarettes    Quit date: 01/28/1990    Years since quitting: 32.5   Smokeless tobacco: Never  Vaping Use   Vaping Use: Never used  Substance and Sexual Activity   Alcohol use: Not Currently   Drug use: No   Sexual activity: Not Currently    Birth control/protection: Post-menopausal, Abstinence  Other Topics Concern   Not on file  Social History Narrative   Lives in 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   Social Determinants of Health   Financial Resource Strain: Not on file  Food Insecurity: Not on file  Transportation Needs: Not on file  Physical Activity: Inactive (01/07/2021)   Exercise Vital Sign    Days of Exercise per Week: 0 days    Minutes of Exercise per Session: 0  min  Stress: Not on file  Social Connections: Not on file    Current Medications:  Current Outpatient Medications:    acetaminophen (TYLENOL) 500 MG tablet, Take 1,000 mg by mouth every 6 (six) hours as needed for moderate pain., Disp: , Rfl:    albuterol (PROAIR HFA) 108 (90 Base) MCG/ACT inhaler, Inhale 2 puffs into the lungs every 6 (six) hours as needed. wheezing, Disp: 18 g, Rfl: 2   aspirin 81 MG chewable tablet, Chew 1 tablet (81 mg total) by mouth daily., Disp: 90 tablet, Rfl: 3   blood glucose meter kit and supplies, Use up to four times daily as directed. (FOR ICD-10 E10.9, E11.9)., Disp: 1 each, Rfl: 99   carvedilol (COREG) 6.25 MG tablet, Take 1 tablet (6.25 mg total) by mouth 2 (two) times daily., Disp: 60 tablet, Rfl: 6   diphenhydrAMINE (BENADRYL) 25 MG tablet, Take 25 mg by mouth at bedtime., Disp: , Rfl:    Dulaglutide (TRULICITY) 3 AX/0.9MM SOPN, Inject 3 mg into the skin once a week., Disp: 6 mL, Rfl: 3   Evolocumab (REPATHA SURECLICK) 768 MG/ML SOAJ, Inject 1 pen into the skin  every 14 (fourteen) days., Disp: 2 mL, Rfl: 11   furosemide (LASIX) 20 MG tablet, Take 1 tablet (20 mg total) by mouth daily., Disp: 90 tablet, Rfl: 3   glucose blood test strip, Use as directed to test blood sugar, Disp: 100 each, Rfl: PRN   Insulin Pen Needle (UNIFINE PENTIPS) 32G X 4 MM MISC, USE AS DIRECTED ONCE A DAY, Disp: 100 each, Rfl: 3   levothyroxine (SYNTHROID) 50 MCG tablet, Take 1 tablet (50 mcg total) by mouth daily before a meal, Disp: 90 tablet, Rfl: 3   magnesium oxide (MAG-OX) 400 (240 Mg) MG tablet, Take 1 tablet (400 mg total) by mouth daily., Disp: 30 tablet, Rfl: 0   metFORMIN (GLUCOPHAGE) 1000 MG tablet, Take 1 tablet (1,000 mg total) by mouth 2 (two) times daily with a meal., Disp: 180 tablet, Rfl: 3   Multiple Vitamin (MULITIVITAMIN WITH MINERALS) TABS, Take 1 tablet by mouth daily with breakfast., Disp: , Rfl:    naproxen (NAPROSYN) 375 MG tablet, Take 1 tablet (375 mg total) by mouth 2 (two) times daily., Disp: 20 tablet, Rfl: 0   NON FORMULARY, Pt uses a cpap nightly, Disp: , Rfl:    OneTouch Delica Lancets 08U MISC, use as directed, Disp: 100 each, Rfl: 1   sacubitril-valsartan (ENTRESTO) 49-51 MG, Take 1 tablet by mouth 2 (two) times daily., Disp: 60 tablet, Rfl: 4   senna-docusate (SENOKOT-S) 8.6-50 MG tablet, Take 2 tablets by mouth at bedtime. For AFTER surgery, do not take if having diarrhea, Disp: 30 tablet, Rfl: 0   spironolactone (ALDACTONE) 25 MG tablet, Take 1 tablet (25 mg total) by mouth daily., Disp: 60 tablet, Rfl: 1   triamcinolone ointment (KENALOG) 0.1 %, Apply 1 application topically two times daily to affected area(s) as needed, sparing use to avoid whitening/thinning skin, Disp: 30 g, Rfl: 1  Review of Systems: + chills Denies appetite changes, fevers, fatigue, unexplained weight changes. Denies hearing loss, neck lumps or masses, mouth sores, ringing in ears or voice changes. Denies cough or wheezing.  Denies shortness of breath. Denies chest  pain or palpitations. Denies leg swelling. Denies abdominal distention, pain, blood in stools, constipation, diarrhea, nausea, vomiting, or early satiety. Denies pain with intercourse, dysuria, frequency, hematuria or incontinence. Denies hot flashes, pelvic pain, vaginal bleeding or vaginal discharge.   Denies joint  pain, back pain or muscle pain/cramps. Denies itching, rash, or wounds. Denies dizziness, headaches, numbness or seizures. Denies swollen lymph nodes or glands, denies easy bruising or bleeding. Denies anxiety, depression, confusion, or decreased concentration.  Physical Exam: BP 103/71 (BP Location: Right Arm, Patient Position: Sitting)   Pulse 96   Temp (!) 97.5 F (36.4 C) (Oral)   Resp 16   Wt 287 lb (130.2 kg)   SpO2 100%   BMI 44.95 kg/m  General: Alert, oriented, no acute distress. HEENT: Normocephalic, atraumatic, sclera anicteric. Chest: Clear to auscultation bilaterally.  No wheezes or rhonchi. Cardiovascular: Regular rate and rhythm, no murmurs. Abdomen: Obese, soft, nontender.  Normoactive bowel sounds.  No masses or hepatosplenomegaly appreciated.  Well-healed incisions. Extremities: Grossly normal range of motion.  Warm, well perfused.  Trace edema bilaterally. Lymphatics: No cervical, supraclavicular, or inguinal adenopathy. GU: Normal appearing external genitalia without erythema, excoriation, or lesions.  Speculum exam reveals moderately atrophic vaginal mucosa, vaginal apex with some radiation changes.  No masses or atypical vascularity.  Bimanual exam reveals cuff intact, no nodularity or firmness.  Rectovaginal exam confirms findings.  Laboratory & Radiologic Studies: None new  Assessment & Plan: Terri Heath is a 66 y.o. 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 Surgery 02/2022. p53 wildtype. 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   MSI high.   Patient is doing well and is NED on exam today.   We have previously discussed imaging 6 months after surgery.  This was scheduled for early January.   Per NCCN surveillance recommendations, we will plan on surveillance visits every 3 months for the first 2-3 years.  We discussed signs and symptoms that would be concerning for cancer recurrence, and I stressed the importance of calling if she develops any of these.  22 minutes of total time was spent for this patient encounter, including preparation, face-to-face counseling with the patient and coordination of care, and documentation of the encounter.  Jeral Pinch, MD  Division of Gynecologic Oncology  Department of Obstetrics and Gynecology  [facility]

## 2022-07-31 ENCOUNTER — Telehealth (HOSPITAL_BASED_OUTPATIENT_CLINIC_OR_DEPARTMENT_OTHER): Payer: Self-pay | Admitting: Family Medicine

## 2022-07-31 NOTE — Telephone Encounter (Signed)
Received mail of disability forms for pt. Pts forms are in provider tray in the back to be reviewed and signed. Pt has appt regarding this on 07/17/22. CMA is aware of paperwork. Please advise.

## 2022-08-01 ENCOUNTER — Other Ambulatory Visit (HOSPITAL_COMMUNITY): Payer: Self-pay

## 2022-08-03 ENCOUNTER — Ambulatory Visit (HOSPITAL_COMMUNITY)
Admission: RE | Admit: 2022-08-03 | Discharge: 2022-08-03 | Disposition: A | Discharge status: Home / Self Care | Payer: Medicare HMO | Source: Ambulatory Visit | Attending: Cardiology | Admitting: Cardiology

## 2022-08-03 ENCOUNTER — Other Ambulatory Visit (HOSPITAL_COMMUNITY): Payer: Self-pay

## 2022-08-03 ENCOUNTER — Encounter (HOSPITAL_COMMUNITY): Payer: Self-pay | Admitting: Cardiology

## 2022-08-03 ENCOUNTER — Other Ambulatory Visit: Payer: Self-pay

## 2022-08-03 ENCOUNTER — Ambulatory Visit (HOSPITAL_BASED_OUTPATIENT_CLINIC_OR_DEPARTMENT_OTHER)
Admission: RE | Admit: 2022-08-03 | Discharge: 2022-08-03 | Disposition: A | Discharge status: Home / Self Care | Payer: Medicare HMO | Source: Ambulatory Visit | Attending: Cardiology | Admitting: Cardiology

## 2022-08-03 ENCOUNTER — Other Ambulatory Visit (HOSPITAL_COMMUNITY): Payer: Self-pay | Admitting: Cardiology

## 2022-08-03 ENCOUNTER — Telehealth (HOSPITAL_COMMUNITY): Payer: Self-pay

## 2022-08-03 VITALS — BP 90/50 | HR 92 | Wt 292.2 lb

## 2022-08-03 DIAGNOSIS — I5022 Chronic systolic (congestive) heart failure: Secondary | ICD-10-CM

## 2022-08-03 DIAGNOSIS — E785 Hyperlipidemia, unspecified: Secondary | ICD-10-CM | POA: Diagnosis not present

## 2022-08-03 DIAGNOSIS — I110 Hypertensive heart disease with heart failure: Secondary | ICD-10-CM | POA: Insufficient documentation

## 2022-08-03 DIAGNOSIS — E782 Mixed hyperlipidemia: Secondary | ICD-10-CM

## 2022-08-03 DIAGNOSIS — E119 Type 2 diabetes mellitus without complications: Secondary | ICD-10-CM | POA: Insufficient documentation

## 2022-08-03 DIAGNOSIS — G4730 Sleep apnea, unspecified: Secondary | ICD-10-CM | POA: Insufficient documentation

## 2022-08-03 DIAGNOSIS — I509 Heart failure, unspecified: Secondary | ICD-10-CM | POA: Diagnosis not present

## 2022-08-03 DIAGNOSIS — E669 Obesity, unspecified: Secondary | ICD-10-CM | POA: Insufficient documentation

## 2022-08-03 DIAGNOSIS — Z87891 Personal history of nicotine dependence: Secondary | ICD-10-CM | POA: Insufficient documentation

## 2022-08-03 LAB — BASIC METABOLIC PANEL WITH GFR
Anion gap: 9 (ref 5–15)
BUN: 21 mg/dL (ref 8–23)
CO2: 24 mmol/L (ref 22–32)
Calcium: 9.1 mg/dL (ref 8.9–10.3)
Chloride: 103 mmol/L (ref 98–111)
Creatinine, Ser: 0.96 mg/dL (ref 0.44–1.00)
GFR, Estimated: >60 mL/min
Glucose, Bld: 199 mg/dL — ABNORMAL HIGH (ref 70–99)
Potassium: 4.5 mmol/L (ref 3.5–5.1)
Sodium: 136 mmol/L (ref 135–145)

## 2022-08-03 LAB — ECHOCARDIOGRAM COMPLETE
Area-P 1/2: 5.02 cm2
S' Lateral: 3 cm
Single Plane A2C EF: 40.9 %

## 2022-08-03 LAB — BRAIN NATRIURETIC PEPTIDE: B Natriuretic Peptide: 33.4 pg/mL (ref 0.0–100.0)

## 2022-08-03 MED ORDER — ENTRESTO 49-51 MG PO TABS: 1.0000 | ORAL_TABLET | Freq: Two times a day (BID) | ORAL | 3 refills | Status: DC

## 2022-08-03 MED ORDER — SPIRONOLACTONE 25 MG PO TABS: 25.0000 mg | ORAL_TABLET | Freq: Every day | ORAL | 3 refills | Status: DC

## 2022-08-03 MED ORDER — ENTRESTO 24-26 MG PO TABS
1.0000 | ORAL_TABLET | Freq: Two times a day (BID) | ORAL | 3 refills | Status: DC
  Filled 2022-08-03: qty 180, 90d supply, fill #0
  Filled 2022-10-27: qty 180, 90d supply, fill #1
  Filled 2023-01-19 (×2): qty 180, 90d supply, fill #2
  Filled 2023-04-25: qty 180, 90d supply, fill #3
  Filled 2023-05-01: qty 180, 90d supply, fill #0

## 2022-08-03 MED ORDER — SPIRONOLACTONE 25 MG PO TABS
25.0000 mg | ORAL_TABLET | Freq: Every day | ORAL | 1 refills | Status: DC
  Filled 2022-08-03: qty 60, 60d supply, fill #0

## 2022-08-03 MED ORDER — ROSUVASTATIN CALCIUM 20 MG PO TABS
20.0000 mg | ORAL_TABLET | Freq: Every day | ORAL | 3 refills | Status: DC
  Filled 2022-08-03: qty 90, 90d supply, fill #0
  Filled 2022-10-20: qty 90, 90d supply, fill #1

## 2022-08-03 MED ORDER — SPIRONOLACTONE 25 MG PO TABS
25.0000 mg | ORAL_TABLET | Freq: Every day | ORAL | 3 refills | Status: DC
  Filled 2022-08-03: qty 90, 90d supply, fill #0
  Filled 2022-10-20: qty 90, 90d supply, fill #1
  Filled 2023-01-23: qty 90, 90d supply, fill #2
  Filled 2023-04-25: qty 90, 90d supply, fill #3
  Filled 2023-05-01: qty 90, 90d supply, fill #0

## 2022-08-03 NOTE — Telephone Encounter (Signed)
Advanced Heart Failure Patient Advocate Encounter  [place] grant information added to [facility]. Left message on patient voicemail.

## 2022-08-03 NOTE — Telephone Encounter (Signed)
Advanced Heart Failure Patient Advocate Encounter  The patient was approved for a [place] that will help cover the cost of Entresto.  Total amount awarded, $10,000.  Effective: 07/04/22 - 07/04/23.  BIN [id number] PCN [id number] Group [id number] ID [id number]  Patient provided with approval and processing information in office, billing information added to [place], CPhT Rx Patient Advocate Phone: [phone]

## 2022-08-03 NOTE — Telephone Encounter (Signed)
Enrolled patient in [employer] for [place].  Pharmacy Card  CARD NO. [id number]   CARD STATUS Active   BIN [id number]   PCN [id number]   PC GROUP [id number]

## 2022-08-03 NOTE — Patient Instructions (Addendum)
START Crestor '20mg'$  daily.  Labs done today, your results will be available in MyChart, we will contact you for abnormal readings.  Repeat blood work in 2 months  Your physician recommends that you schedule a follow-up appointment in: 6 months ( June 2024)  ** please call the office in March to arrange your follow up appointment **  If you have any questions or concerns before your next appointment please send [place] a message through [place] or call our office at [phone].    TO LEAVE A MESSAGE FOR THE NURSE SELECT OPTION 2, PLEASE LEAVE A MESSAGE INCLUDING: YOUR NAME DATE OF BIRTH CALL BACK NUMBER REASON FOR CALL**this is important as we prioritize the call backs  YOU WILL RECEIVE A CALL BACK THE SAME DAY AS LONG AS YOU CALL BEFORE 4:00 PM  At the [facility], you and your health needs are our priority. As part of our continuing mission to provide you with exceptional heart care, we have created designated Provider Care Teams. These Care Teams include your primary Cardiologist (physician) and Advanced Practice Providers (APPs- Physician Assistants and Nurse Practitioners) who all work together to provide you with the care you need, when you need it.   You may see any of the following providers on your designated Care Team at your next follow up: Dr Glori Bickers Dr Loralie Champagne Dr. Roxana Hires, NP Lyda Jester, [place] [facility] [place], [place] Forestine Na, NP Audry Riles, PharmD   Please be sure to bring in all your medications bottles to every appointment.

## 2022-08-03 NOTE — Progress Notes (Signed)
  Echocardiogram 2D Echocardiogram has been performed.  Terri Heath 08/03/2022, 2:03 PM

## 2022-08-05 NOTE — Progress Notes (Signed)
PCP: [place], Raymond J, MD  Cardiology: Dr. Irish Lack HF Cardiology: Dr. Aundra Dubin  66 y.o. with history of chronic systolic CHF, CAD, and MGUS was referred by Dr. Irish Lack for evaluation of CHF.  Patient first developed exertional dyspnea after her appendectomy in 2/19.  By 5/19, she was gaining weight and had developed a chronic cough. She never had chest pain.  Echo was done in 5/19 showing EF 25-30%, moderate MR, moderate RV dilation.  RHC/LHC was done in 6/19 showing elevated filling pressures, preserved cardiac output, and occluded mid LCx and distal RCA.  She was found to have IgM monoclonal antibody.  Skeletal survey showed no lytic lesions, likely MGUS.  Cardiac MRI in 8/19 showed EF 32%, subendocardial scar (consistent with prior MI) at inferior base, moderately dilated RV with RV EF 20%. No evidence for cardiac amyloidosis by MRI.  Repeat echo in 10/19 showed EF remaining 30-35%.  She was seen by Dr. Rayann Heman and declined ICD.   Echo in 10/20 showed EF 40-45%.   She was unable to tolerate Entresto 97/103 due to lightheadedness.   Echo in 10/22 showed EF 50% with inferolateral hypokinesis, RV normal, IVC normal.   She has been diagnosed with uterine cancer and had cisplatin chemotherapy and radiation.  She had a hysterectomy.   Echo was done today and reviewed, EF 50-55%, mild RV enlargement with normal RV systolic function.   She returns for followup of CHF.  She had a recent mechanical fall with distal tibial fracture.  She is in a boot.  She has had episodes of lightheadedness with standing, not clear if this contributed to her fall.  BP 90/50 today.  No chest pain.  No dyspnea but limited by orthopedic boot. Weight down 10 lbs.  She is not on semaglutide now as it was not covered well.  She also is off Repatha due to difficulty with getting coverage.  She does have some generalized fatigue.   ECG (personally reviewed): NSR, poor RWP, nonspecific T wave flattening.   Labs (5/19):  transferrin saturation 20%, immunofixation with IgM monoclonal antibody.  Labs (6/19): K 4.5, creatinine 0.79 Labs (7/19): K 4.1, creatinine 0.86, TSH normal Labs (9/19): K 4.4, creatinine 0.61 Labs (10/19): LDL 187 Labs (12/19): LDL 82 Labs (1/20): K 4, creatinine 0.81 Labs (8/20): K 4.4, creatinine 0.82, LDL 92 Labs (11/21): LDL 88, HDl 52, TGs 208 Labs (1/22): K 4.5, creatinine 1.02 Labs (5/22): K 4.2, creatinine 0.96 Labs( 5/23): K 4.5, creatinine 0.93, hgb 11.5 Labs (7/23): K 4.9, creatinine 0.97  PMH: 1. Type II diabetes 2. OSA 3. H/o appendectomy 4. 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  Skeletal survey in 7/19 showed no lytic lesions.  6. 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   - Echo (5/19): EF 25-30%, mild LV dilation, moderate MR, moderately dilated RV.  - LHC/RHC (6/19): distal RCA totally occluded, mid LCx totally occluded, EF < 25%. No intervention. Mean RA 10, mean PA 42, mean PCWP 29, CI 2.3.  - Cardiac MRI in 8/19 showed EF 32%, subendocardial scar (consistent with prior MI) at inferior base, moderately dilated RV with RV EF 20%. No evidence for cardiac amyloidosis by MRI.  - Echo (10/19): EF 30-35%.   - Echo (10/20): EF 40-45%, mild LVH, mild MR - Echo (10/22): EF 50% with inferolateral hypokinesis, RV normal, IVC normal. - Echo (12/23): EF 50-55%, mild RV enlargement with normal RV systolic function 7. CAD: LHC (6/19) with distal RCA totally  occluded, mid LCx totally occluded, EF < 25%. No intervention. 8. Hyperlipidemia: Has not tolerated statins.  9. ABIs (8/19): Normal.  10. COVID-19 infection in 9/22.   SH: Married, retired [employer] scientist, quit smoking in 1991.    FH: Mother with rheumatic MV disease, s/p MVR.   ROS: All systems reviewed and negative except as per HPI.   Current Outpatient Medications  Medication Sig Dispense Refill   acetaminophen (TYLENOL) 500 MG tablet Take 1,000 mg by mouth every  6 (six) hours as needed for moderate pain.     albuterol (PROAIR HFA) 108 (90 Base) MCG/ACT inhaler Inhale 2 puffs into the lungs every 6 (six) hours as needed. wheezing 18 g 2   aspirin 81 MG chewable tablet Chew 1 tablet (81 mg total) by mouth daily. 90 tablet 3   blood glucose meter kit and supplies Use up to four times daily as directed. (FOR ICD-10 E10.9, E11.9). 1 each 99   carvedilol (COREG) 6.25 MG tablet Take 1 tablet (6.25 mg total) by mouth 2 (two) times daily. 60 tablet 6   diphenhydrAMINE (BENADRYL) 25 MG tablet Take 25 mg by mouth at bedtime.     Dulaglutide (TRULICITY) 3 DX/8.3JA SOPN Inject 3 mg into the skin once a week. 6 mL 3   furosemide (LASIX) 20 MG tablet Take 1 tablet (20 mg total) by mouth daily. 90 tablet 3   glucose blood test strip Use as directed to test blood sugar 100 each PRN   Insulin Pen Needle (UNIFINE PENTIPS) 32G X 4 MM MISC USE AS DIRECTED ONCE A DAY 100 each 3   levothyroxine (SYNTHROID) 50 MCG tablet Take 1 tablet (50 mcg total) by mouth daily before a meal 90 tablet 3   magnesium oxide (MAG-OX) 400 (240 Mg) MG tablet Take 1 tablet (400 mg total) by mouth daily. 30 tablet 0   metFORMIN (GLUCOPHAGE) 1000 MG tablet Take 1 tablet (1,000 mg total) by mouth 2 (two) times daily with a meal. 180 tablet 3   Multiple Vitamin (MULITIVITAMIN WITH MINERALS) TABS Take 1 tablet by mouth daily with breakfast.     naproxen (NAPROSYN) 375 MG tablet Take 1 tablet (375 mg total) by mouth 2 (two) times daily. 20 tablet 0   NON FORMULARY Pt uses a cpap nightly     OneTouch Delica Lancets 25K MISC use as directed 100 each 1   rosuvastatin (CRESTOR) 20 MG tablet Take 1 tablet (20 mg total) by mouth daily. 90 tablet 3   sacubitril-valsartan (ENTRESTO) 24-26 MG Take 1 tablet by mouth 2 (two) times daily. 180 tablet 3   senna-docusate (SENOKOT-S) 8.6-50 MG tablet Take 2 tablets by mouth at bedtime. For AFTER surgery, do not take if having diarrhea 30 tablet 0   triamcinolone  ointment (KENALOG) 0.1 % Apply 1 application topically two times daily to affected area(s) as needed, sparing use to avoid whitening/thinning skin 30 g 1   Evolocumab (REPATHA SURECLICK) 539 MG/ML SOAJ Inject 1 pen into the skin every 14 (fourteen) days. (Patient not taking: Reported on 08/03/2022) 2 mL 11   spironolactone (ALDACTONE) 25 MG tablet Take 1 tablet (25 mg total) by mouth daily. 90 tablet 3   No current facility-administered medications for this encounter.   BP (!) 90/50   Pulse 92   Wt 132.5 kg (292 lb 3.2 oz)   SpO2 98%   BMI 45.76 kg/m  General: NAD, obese.  Neck: No JVD, no thyromegaly or thyroid nodule.  Lungs: Clear to auscultation  bilaterally with normal respiratory effort. CV: Nondisplaced PMI.  Heart regular S1/S2, no S3/S4, no murmur.  No peripheral edema.  No carotid bruit.  Normal pedal pulses.  Abdomen: Soft, nontender, no hepatosplenomegaly, no distention.  Skin: Intact without lesions or rashes.  Neurologic: Alert and oriented x 3.  Psych: Normal affect. Extremities: No clubbing or cyanosis.  HEENT: Normal.   Assessment/Plan: 1. Chronic systolic CHF: Echo in 7/98 with EF 25-30%.  She had occluded mid LCx and distal RCA on coronary angiography.  Suspect ischemic cardiomyopathy.  She also has a monoclonal antibody.  Cardiac MRI in 8/19 showed LV EF 32% and RV EF 30%, subendocardial scar at inferior base consistent with prior MI, no evidence for cardiac amyloidosis.  Repeat echo in 10/19 showed EF 30-35%.  Patient saw Dr. Rayann Heman and declined ICD.  However, echo in 10/20 showed EF up to 40-45% and out of ICD range. Echo in 10/22 showed EF 50% with inferolateral hypokinesis, RV normal, IVC normal.  Echo today was stable with EF 50-55%.  NYHA class II symptoms, not volume overloaded.  BP running low and she has some orthostatic symptoms.   - Decrease Entresto to 24/26 bid with orthostatic symptoms.  - Continue spironolactone 25 mg daily.  - Continue Coreg 6.25 mg bid.   - She has not wanted to take an SGLT2 inhibitor due to history of GU infections.    - Continue Lasix 20 mg daily, BMET/BNP today.  2. CAD: Occluded mLCx and dRCA.  No interventional target.   - Continue ASA 81 daily.  - She has been taking Repatha due to cramps with Crestor.  She is willing to retry Crestor 20 mg daily while we try to get her back on Repatha.  3. Obesity:  Work on diet and exercise.  Weight trending down.  Insurance has not covered semaglutide well enough for her to get it.   Followup 6 months with APP.   Loralie Champagne 08/05/2022

## 2022-08-09 ENCOUNTER — Telehealth (HOSPITAL_BASED_OUTPATIENT_CLINIC_OR_DEPARTMENT_OTHER): Payer: Self-pay

## 2022-08-09 NOTE — Telephone Encounter (Signed)
Pt was called and told disability paperwork was faxed over to [employer] on 08/08/22 for the [place]. Two part of the paperwork was faxed over on 08-09-22 at 7:50 am and I received faxed confirmation.

## 2022-08-10 ENCOUNTER — Telehealth (HOSPITAL_BASED_OUTPATIENT_CLINIC_OR_DEPARTMENT_OTHER): Payer: Self-pay

## 2022-08-10 ENCOUNTER — Other Ambulatory Visit (HOSPITAL_COMMUNITY): Payer: Self-pay

## 2022-08-10 NOTE — Telephone Encounter (Signed)
Pt called and stated [employer] called her in regards to her paperwork they received. They need more information in regards to pt condition and pt will be here on next Monday for an appt.

## 2022-08-14 ENCOUNTER — Ambulatory Visit (INDEPENDENT_AMBULATORY_CARE_PROVIDER_SITE_OTHER): Payer: Medicare HMO | Admitting: Family Medicine

## 2022-08-14 ENCOUNTER — Encounter (HOSPITAL_BASED_OUTPATIENT_CLINIC_OR_DEPARTMENT_OTHER): Payer: Self-pay | Admitting: Family Medicine

## 2022-08-14 VITALS — BP 108/85 | HR 95 | Temp 97.7°F | Ht 67.0 in | Wt 284.0 lb

## 2022-08-14 DIAGNOSIS — E1165 Type 2 diabetes mellitus with hyperglycemia: Secondary | ICD-10-CM | POA: Diagnosis not present

## 2022-08-14 DIAGNOSIS — S82831D Other fracture of upper and lower end of right fibula, subsequent encounter for closed fracture with routine healing: Secondary | ICD-10-CM | POA: Diagnosis not present

## 2022-08-14 DIAGNOSIS — E1159 Type 2 diabetes mellitus with other circulatory complications: Secondary | ICD-10-CM

## 2022-08-14 NOTE — Progress Notes (Signed)
    Procedures performed today:    None.  Independent interpretation of notes and tests performed by another provider:   None.  Brief History, Exam, Impression, and Recommendations:    BP 108/85 (BP Location: Right Arm, Patient Position: Sitting, Cuff Size: Large)   Pulse 95   Temp 97.7 F (36.5 C) (Oral)   Ht '5\' 7"'$  (1.702 m)   Wt 284 lb (128.8 kg)   SpO2 100%   BMI 44.48 kg/m   No problem-specific Assessment & Plan notes found for this encounter.  No follow-ups on file.   ___________________________________________ Ruey Storer [place], MD, ABFM, [facility] and [place]

## 2022-08-15 ENCOUNTER — Other Ambulatory Visit (HOSPITAL_BASED_OUTPATIENT_CLINIC_OR_DEPARTMENT_OTHER): Payer: Self-pay | Admitting: Family Medicine

## 2022-08-15 DIAGNOSIS — E1159 Type 2 diabetes mellitus with other circulatory complications: Secondary | ICD-10-CM

## 2022-08-15 LAB — MICROALBUMIN / CREATININE URINE RATIO
Creatinine, Urine: 50 mg/dL
Microalb/Creat Ratio: <6 mg/g creat (ref 0–29)
Microalbumin, Urine: <3 ug/mL

## 2022-08-15 LAB — HEMOGLOBIN A1C
Est. average glucose Bld gHb Est-mCnc: 203 mg/dL
Hgb A1c MFr Bld: 8.7 % — ABNORMAL HIGH (ref 4.8–5.6)

## 2022-08-16 NOTE — Assessment & Plan Note (Addendum)
Patient presents for follow-up of right distal fibular fracture.  She is utilizing walker to assist with ambulation, continues to wear cam boot which was placed at last office visit.  She reports that symptoms have been gradually improving.  She has been able to do ambulate with utilization of walker.  Pain has improved.  Still has some slight discomfort in the area of fracture.  Denies any calf pain, no increased swelling. She does have questions today regarding return to work and completion of paperwork related to short-term disability.  We discussed today potential limitations related to fracture.  Discussed that as healing is progressing, she is able to ambulate as tolerated with weightbearing as tolerated.  Around this time, we do look to begin transitioning out of boot and into normal footwear.  Additionally, can consider working with physical therapy related to mobility and strength training. Interest to potential restrictions and request for short-term disability, patient reports that she has concerns related to occasionally feeling unsteady on her feet, having occasional symptoms of lightheadedness or dizziness when she goes to stand up after bending over.  She is primarily concerned about risk of fall related to her underlying symptoms. Long discussion with patient today that while her concerns may be valid related to risk of falling and other symptoms that she is experiencing,, these are not specifically related to her acute fibula fracture which is primarily related to determination being made at this time.  Unfortunately, she has had poor control of her chronic medical conditions which likely increases risk related to complications and symptoms that she is experiencing separate from healing fracture.  As discussed below, patient has had poorly controlled diabetes for some time now with continued elevated hemoglobin A1c which remains outside of desired target range. Intervention to her fracture, feel  the patient can continue gradually progressing activities.  She would likely benefit from working with physical therapy downstairs here and she is amenable to this, referral placed. Can continue with use of cam boot and transitioning to regular footwear as tolerated.  Can also continue to progress weightbearing activities as tolerated.  Will plan to recheck x-rays around time of next office visit in about 3 to 4 weeks.  Discussed possibility of obtaining x-rays prior to appointment so that we may be able to review them in the office, however she declines and would prefer to do them on the same day as the visit.  She is aware that this will mean that we will not have x-rays to review or discuss during her office

## 2022-08-16 NOTE — Assessment & Plan Note (Signed)
Unfortunately, patient has had suboptimal control blood sugar/hemoglobin A1c.  She is due for hemoglobin A1c check at this time.  We will check hemoglobin A1c today in order to monitor current status and plan for further review of ongoing diabetes management at future office visit

## 2022-08-30 ENCOUNTER — Other Ambulatory Visit (HOSPITAL_COMMUNITY): Payer: Self-pay

## 2022-08-31 ENCOUNTER — Other Ambulatory Visit (HOSPITAL_COMMUNITY): Payer: Self-pay

## 2022-09-04 ENCOUNTER — Ambulatory Visit (HOSPITAL_COMMUNITY)
Admission: RE | Admit: 2022-09-04 | Discharge: 2022-09-04 | Disposition: A | Discharge status: Home / Self Care | Payer: Medicare HMO | Source: Ambulatory Visit | Attending: Gynecologic Oncology | Admitting: Gynecologic Oncology

## 2022-09-04 DIAGNOSIS — C541 Malignant neoplasm of endometrium: Secondary | ICD-10-CM | POA: Diagnosis not present

## 2022-09-04 LAB — POCT I-STAT CREATININE: Creatinine, Ser: 1.1 mg/dL — ABNORMAL HIGH (ref 0.44–1.00)

## 2022-09-04 MED ORDER — IOHEXOL 300 MG/ML  SOLN
100.0000 mL | Freq: Once | INTRAMUSCULAR | Status: AC | PRN
  Administered 2022-09-04: 100 mL via INTRAVENOUS

## 2022-09-05 ENCOUNTER — Other Ambulatory Visit (HOSPITAL_COMMUNITY): Payer: Self-pay

## 2022-09-05 ENCOUNTER — Other Ambulatory Visit: Payer: Self-pay

## 2022-09-06 ENCOUNTER — Other Ambulatory Visit (HOSPITAL_COMMUNITY): Payer: Self-pay

## 2022-09-06 ENCOUNTER — Encounter (HOSPITAL_COMMUNITY): Payer: Self-pay

## 2022-09-07 ENCOUNTER — Other Ambulatory Visit (HOSPITAL_COMMUNITY): Payer: Self-pay

## 2022-09-07 ENCOUNTER — Telehealth (HOSPITAL_BASED_OUTPATIENT_CLINIC_OR_DEPARTMENT_OTHER): Payer: Self-pay | Admitting: Nurse Practitioner

## 2022-09-07 ENCOUNTER — Other Ambulatory Visit: Payer: Self-pay

## 2022-09-07 ENCOUNTER — Ambulatory Visit (INDEPENDENT_AMBULATORY_CARE_PROVIDER_SITE_OTHER): Payer: Medicare HMO

## 2022-09-07 ENCOUNTER — Ambulatory Visit (INDEPENDENT_AMBULATORY_CARE_PROVIDER_SITE_OTHER): Payer: Medicare HMO | Admitting: Family Medicine

## 2022-09-07 ENCOUNTER — Other Ambulatory Visit (HOSPITAL_BASED_OUTPATIENT_CLINIC_OR_DEPARTMENT_OTHER): Payer: Self-pay | Admitting: Nurse Practitioner

## 2022-09-07 ENCOUNTER — Encounter (HOSPITAL_BASED_OUTPATIENT_CLINIC_OR_DEPARTMENT_OTHER): Payer: Self-pay | Admitting: Family Medicine

## 2022-09-07 VITALS — BP 118/79 | HR 101 | Ht 67.0 in | Wt 290.0 lb

## 2022-09-07 DIAGNOSIS — S82831D Other fracture of upper and lower end of right fibula, subsequent encounter for closed fracture with routine healing: Secondary | ICD-10-CM | POA: Diagnosis not present

## 2022-09-07 DIAGNOSIS — E1165 Type 2 diabetes mellitus with hyperglycemia: Secondary | ICD-10-CM | POA: Diagnosis not present

## 2022-09-07 DIAGNOSIS — S82831A Other fracture of upper and lower end of right fibula, initial encounter for closed fracture: Secondary | ICD-10-CM | POA: Diagnosis not present

## 2022-09-07 DIAGNOSIS — E1159 Type 2 diabetes mellitus with other circulatory complications: Secondary | ICD-10-CM

## 2022-09-07 MED ORDER — INSULIN GLARGINE 100 UNIT/ML SOLOSTAR PEN: 27.0000 [IU] | PEN_INJECTOR | Freq: Every day | SUBCUTANEOUS | 1 refills | Status: DC

## 2022-09-07 NOTE — Assessment & Plan Note (Signed)
Hemoglobin A1c checked at last office visit and remained stable, however is above goal at 8.7%.  Discussed with patient today that ultimately would expect goal for hemoglobin A1c for her to be less than 7.5%.  She currently continues with Trulicity 3 mg weekly, metformin, Lantus 25 units daily.  She denies any issues with low blood sugar symptoms or readings.  She indicates that her fasting blood sugar in the morning is around 180. Given FBG and elevated hemoglobin A1c, feel that adjustments to medication is warranted.  We discussed options including adjusting dose of Trulicity as well as insulin, she reports that at higher dose of Trulicity she had more GI related side effects.  Due to this, would make slight change to insulin dose with increase of 2 units at this time.  Recommend continuing with monitoring fasting blood sugar with new insulin dose to assess for any improvement. Will plan for follow-up in about 3 months to monitor progress.  It seems that she may still be deciding between remaining here her primary care needs or transitioning to follow Sarabeth to her new office

## 2022-09-07 NOTE — Progress Notes (Signed)
    Procedures performed today:    None.  Independent interpretation of notes and tests performed by another provider:   None.  Brief History, Exam, Impression, and Recommendations:    BP 118/79 (BP Location: Left Arm, Patient Position: Sitting, Cuff Size: Large)   Pulse (!) 101   Ht '5\' 7"'$  (1.702 m)   Wt 290 lb (131.5 kg)   SpO2 99%   BMI 45.42 kg/m   Closed fracture of distal fibula Patient reports that she continues to do well.  Pain is minimal at this point, she indicates that occasional discomfort that she will experience feels almost like a bruise.  She reports that pain in her knees is more bothersome at this time.  She has been ambulating more, has been able to spend more time in the kitchen, preparing meals.  She is planning to proceed with PT/rehab in the future. On exam, no significant tenderness to palpation over fracture site, slightly limited ankle range of motion for all planes.  Good strength.  Distal neurovascular exam is intact. Will proceed with x-rays today for monitoring of fracture healing She may continue with activities as tolerated, provided handout reviewing home exercises which can be utilized to help with improving range of motion and strength at ankle.  Poorly controlled type 2 diabetes mellitus with circulatory disorder (HCC) Hemoglobin A1c checked at last office visit and remained stable, however is above goal at 8.7%.  Discussed with patient today that ultimately would expect goal for hemoglobin A1c for her to be less than 7.5%.  She currently continues with Trulicity 3 mg weekly, metformin, Lantus 25 units daily.  She denies any issues with low blood sugar symptoms or readings.  She indicates that her fasting blood sugar in the morning is around 180. Given FBG and elevated hemoglobin A1c, feel that adjustments to medication is warranted.  We discussed options including adjusting dose of Trulicity as well as insulin, she reports that at higher dose of  Trulicity she had more GI related side effects.  Due to this, would make slight change to insulin dose with increase of 2 units at this time.  Recommend continuing with monitoring fasting blood sugar with new insulin dose to assess for any improvement. Will plan for follow-up in about 3 months to monitor progress.  It seems that she may still be deciding between remaining here her primary care needs or transitioning to follow Laretta Bolster to her new office  Return in about 3 months (around 12/07/2022) for DM.   ___________________________________________ Terri Catlin [place], MD, ABFM, [facility] and [place]

## 2022-09-07 NOTE — Assessment & Plan Note (Addendum)
Patient reports that she continues to do well.  Pain is minimal at this point, she indicates that occasional discomfort that she will experience feels almost like a bruise.  She reports that pain in her knees is more bothersome at this time.  She has been ambulating more, has been able to spend more time in the kitchen, preparing meals.  She is planning to proceed with PT/rehab in the future. On exam, no significant tenderness to palpation over fracture site, slightly limited ankle range of motion for all planes.  Good strength.  Distal neurovascular exam is intact. Will proceed with x-rays today for monitoring of fracture healing She may continue with activities as tolerated, provided handout reviewing home exercises which can be utilized to help with improving range of motion and strength at ankle.

## 2022-09-07 NOTE — Telephone Encounter (Signed)
Spoke with pt at end of visit has f/u with SB at [facility] and stated she will be following her there. updating chart pt is aware and agreed. 09/07/22 [place]

## 2022-09-08 ENCOUNTER — Other Ambulatory Visit: Payer: Self-pay

## 2022-09-08 ENCOUNTER — Other Ambulatory Visit (HOSPITAL_COMMUNITY): Payer: Self-pay

## 2022-09-12 ENCOUNTER — Ambulatory Visit (INDEPENDENT_AMBULATORY_CARE_PROVIDER_SITE_OTHER): Payer: Medicare HMO | Admitting: Nurse Practitioner

## 2022-09-12 ENCOUNTER — Encounter: Payer: Self-pay | Admitting: Nurse Practitioner

## 2022-09-12 ENCOUNTER — Other Ambulatory Visit (HOSPITAL_COMMUNITY): Payer: Self-pay

## 2022-09-12 VITALS — BP 110/70 | HR 80 | Ht 67.0 in | Wt 291.0 lb

## 2022-09-12 DIAGNOSIS — Z6841 Body Mass Index (BMI) 40.0 and over, adult: Secondary | ICD-10-CM

## 2022-09-12 DIAGNOSIS — E1165 Type 2 diabetes mellitus with hyperglycemia: Secondary | ICD-10-CM

## 2022-09-12 DIAGNOSIS — I739 Peripheral vascular disease, unspecified: Secondary | ICD-10-CM | POA: Diagnosis not present

## 2022-09-12 DIAGNOSIS — I5022 Chronic systolic (congestive) heart failure: Secondary | ICD-10-CM

## 2022-09-12 DIAGNOSIS — E039 Hypothyroidism, unspecified: Secondary | ICD-10-CM

## 2022-09-12 DIAGNOSIS — G4733 Obstructive sleep apnea (adult) (pediatric): Secondary | ICD-10-CM

## 2022-09-12 DIAGNOSIS — Z8542 Personal history of malignant neoplasm of other parts of uterus: Secondary | ICD-10-CM

## 2022-09-12 DIAGNOSIS — D61818 Other pancytopenia: Secondary | ICD-10-CM

## 2022-09-12 DIAGNOSIS — E1159 Type 2 diabetes mellitus with other circulatory complications: Secondary | ICD-10-CM | POA: Diagnosis not present

## 2022-09-12 DIAGNOSIS — I152 Hypertension secondary to endocrine disorders: Secondary | ICD-10-CM

## 2022-09-12 DIAGNOSIS — E1142 Type 2 diabetes mellitus with diabetic polyneuropathy: Secondary | ICD-10-CM

## 2022-09-12 MED ORDER — INSULIN GLARGINE 100 UNIT/ML SOLOSTAR PEN
25.0000 [IU] | PEN_INJECTOR | Freq: Every day | SUBCUTANEOUS | 3 refills | Status: DC
  Filled 2022-09-12: qty 36, 90d supply, fill #0
  Filled 2023-01-19 (×2): qty 36, 90d supply, fill #1
  Filled 2023-07-17: qty 36, 90d supply, fill #0

## 2022-09-12 NOTE — Patient Instructions (Addendum)
It was so great to see you today!!! I am so proud of you!

## 2022-09-13 ENCOUNTER — Other Ambulatory Visit (HOSPITAL_COMMUNITY): Payer: Self-pay

## 2022-09-14 ENCOUNTER — Ambulatory Visit (HOSPITAL_BASED_OUTPATIENT_CLINIC_OR_DEPARTMENT_OTHER): Payer: Medicare HMO | Admitting: Physical Therapy

## 2022-09-14 ENCOUNTER — Other Ambulatory Visit (HOSPITAL_BASED_OUTPATIENT_CLINIC_OR_DEPARTMENT_OTHER): Payer: Self-pay

## 2022-09-19 ENCOUNTER — Encounter: Payer: Self-pay | Admitting: Nurse Practitioner

## 2022-09-19 NOTE — Assessment & Plan Note (Signed)
Chronic. Currently labs are stable. No alarm symptoms. Continue current treatment and monitoring.

## 2022-09-19 NOTE — Assessment & Plan Note (Signed)
Chronic. She appears to be doing well with no signs of fluid volume overload at this time. She is currently on lasix. Her BP is stable. Review of recent labs today show no concerns for electrolyte abnormality. Recommend continue on current therapy with close monitoring. Increase exercise with goal of at least 10-15 minutes of walking daily.

## 2022-09-19 NOTE — Assessment & Plan Note (Signed)
Chemotherapy and radiation treatments are complete and she has recovered from surgery. She is overall doing very well. Recent CT shows no evidence of metastasis, which is wonderful news! We will continue to follow along and provide support to oncology providers. She is aware to reach out immediately if any new symptoms present.

## 2022-09-19 NOTE — Assessment & Plan Note (Signed)
Chronic. A1c was a little higher than goal last month, but she is working on improving her numbers and increasing her exercise. She is in need of lantus refill today. I recommend that she continue to monitor her blood sugars closely and titrate the lantus up 2 units every three days her FBG is greater than 150. If any signs of hypoglycemia, I recommend decreasing lantus by 2 units. We will continue to monitor closely.

## 2022-09-19 NOTE — Assessment & Plan Note (Signed)
Chronic. Blood pressures are very well controlled at this time. No alarm symptoms present. Recommend continuation of current therapy and close monitoring. Labs reviewed today. Exercise and diet recommendations provided.

## 2022-09-19 NOTE — Progress Notes (Signed)
Terri Keeler, DNP, AGNP-c [place]  [address] [place], [place] [address] [phone]  ESTABLISHED PATIENT- Chronic Health and/or Follow-Up Visit  Blood pressure 110/70, pulse 80, height '5\' 7"'$  (1.702 m), weight 291 lb (132 kg).    Terri Heath is a 67 y.o. year old female presenting today for evaluation and management of the following: DM Terri Heath has been doing well with her diabetes recently and monitoring closely. She is following a low carb, low fat diet and working to get more exercise. She was seen last month with [facility] and A1c and urine micro were performed. She is currently on lantus 27u daily (with titration by 2u every 3 days for FBG > 629), trulicity '3mg'$ , and metformin for management. She is tolerating her medications well and has not had any hypoglycemic episodes. She does check her BG every morning, these numbers have been fairly stable. She has comorbid conditions of CHF, HTN, HLD, Obesity, aortic atherosclerosis, and OSA which contribute to increased risks.   Endometrial Cancer Terri Heath was diagnosed with endometrial cancer last year after abnormal post menopausal uterine bleeding presented. She underwent surgery last summer and has undergone radiation and cisplatin treatments. She denies any vaginal bleeding, pain, or concerning symptoms. She tells me her most recent CT scan was clear of any metastatic disease.    All ROS negative with exception of what is listed above.   PHYSICAL EXAM Physical Exam Vitals and nursing note reviewed.  Constitutional:      General: She is not in acute distress.    Appearance: Normal appearance. She is obese.  HENT:     Head: Normocephalic.  Eyes:     Extraocular Movements: Extraocular movements intact.     Conjunctiva/sclera: Conjunctivae normal.     Pupils: Pupils are equal, round, and reactive to light.  Neck:     Vascular: No carotid bruit.  Cardiovascular:     Rate and Rhythm: Normal rate and regular rhythm.      Pulses: Normal pulses.     Heart sounds: Normal heart sounds. No murmur heard. Pulmonary:     Effort: Pulmonary effort is normal.     Breath sounds: Normal breath sounds. No wheezing.  Abdominal:     General: Bowel sounds are normal. There is no distension.     Palpations: Abdomen is soft.     Tenderness: There is no abdominal tenderness. There is no right CVA tenderness, left CVA tenderness or guarding.  Musculoskeletal:        General: Normal range of motion.     Cervical back: Normal range of motion and neck supple.     Right lower leg: No edema.     Left lower leg: No edema.  Lymphadenopathy:     Cervical: No cervical adenopathy.  Skin:    General: Skin is warm and dry.     Capillary Refill: Capillary refill takes less than 2 seconds.  Neurological:     General: No focal deficit present.     Mental Status: She is alert and oriented to person, place, and time.  Psychiatric:        Mood and Affect: Mood normal.        Behavior: Behavior normal.        Thought Content: Thought content normal.        Judgment: Judgment normal.     PLAN Problem List Items Addressed This Visit     Poorly controlled type 2 diabetes mellitus with circulatory disorder ([place])  Chronic. A1c was a little higher than goal last month, but she is working on improving her numbers and increasing her exercise. She is in need of lantus refill today. I recommend that she continue to monitor her blood sugars closely and titrate the lantus up 2 units every three days her FBG is greater than 150. If any signs of hypoglycemia, I recommend decreasing lantus by 2 units. We will continue to monitor closely.       Relevant Medications   insulin glargine (LANTUS) 100 UNIT/ML Solostar Pen   Hypertension associated with type 2 diabetes mellitus ([place]) - Primary    Chronic. Blood pressures are very well controlled at this time. No alarm symptoms present. Recommend continuation of current therapy and close  monitoring. Labs reviewed today. Exercise and diet recommendations provided.       Relevant Medications   insulin glargine (LANTUS) 100 UNIT/ML Solostar Pen   OSA (obstructive sleep apnea)    Chronic. She does seem to be doing better with this and her daytime fatigue is improving. No alarm sx present. Continue to work on CPAP use nightly with goal of at least 4 hours.       Chronic systolic CHF (congestive heart failure), NYHA class 3 (HCC)    Chronic. She appears to be doing well with no signs of fluid volume overload at this time. She is currently on lasix. Her BP is stable. Review of recent labs today show no concerns for electrolyte abnormality. Recommend continue on current therapy with close monitoring. Increase exercise with goal of at least 10-15 minutes of walking daily.       Acquired hypothyroidism    Chronic. Currently labs are stable. No alarm symptoms. Continue current treatment and monitoring.       Claudication in peripheral vascular disease (HCC)    Chronic. Doing well overall. Condition does create limitations for exercise. I would like to see her walking at least 10 minutes a day. Recommend use of compression stockings during this exercise to help prevent edema and lessen pain. Seated exercises would be of great benefit to her without concern for leg pain. I recommend looking on YouTube for seated exercise to increase her physical activity.       Endometrial cancer [facility])    Chemotherapy and radiation treatments are complete and she has recovered from surgery. She is overall doing very well. Recent CT shows no evidence of metastasis, which is wonderful news! We will continue to follow along and provide support to oncology providers. She is aware to reach out immediately if any new symptoms present.        Diabetic peripheral neuropathy associated with type 2 diabetes mellitus (HCC)    Chronic. Still working on diet and exercise modifications and increased lantus to help  with management and reduction of risks. No new symptoms at this time. Will continue to monitor.       Relevant Medications   insulin glargine (LANTUS) 100 UNIT/ML Solostar Pen   Pancytopenia, acquired ([place])    This has resolved as of last labs. We will plan to repeat in about 3 months to ensure that there are no changes. Her last check did show continued anemia, but this should be improving. Will follow. No alarm sx present at this time.       Other Visit Diagnoses     Body mass index (BMI) 45.0-49.9, adult (HCC)   (Chronic)     Relevant Medications   insulin glargine (LANTUS) 100 UNIT/ML Solostar Pen  Return in about 4 months (around 01/11/2023) for Chronic.   Terri Keeler, DNP, AGNP-c 09/12/2022  4:13 PM

## 2022-09-19 NOTE — Assessment & Plan Note (Signed)
Chronic. She does seem to be doing better with this and her daytime fatigue is improving. No alarm sx present. Continue to work on CPAP use nightly with goal of at least 4 hours.

## 2022-09-19 NOTE — Assessment & Plan Note (Signed)
This has resolved as of last labs. We will plan to repeat in about 3 months to ensure that there are no changes. Her last check did show continued anemia, but this should be improving. Will follow. No alarm sx present at this time.

## 2022-09-19 NOTE — Assessment & Plan Note (Signed)
Chronic. Doing well overall. Condition does create limitations for exercise. I would like to see her walking at least 10 minutes a day. Recommend use of compression stockings during this exercise to help prevent edema and lessen pain. Seated exercises would be of great benefit to her without concern for leg pain. I recommend looking on YouTube for seated exercise to increase her physical activity.

## 2022-09-19 NOTE — Assessment & Plan Note (Signed)
Chronic. Still working on diet and exercise modifications and increased lantus to help with management and reduction of risks. No new symptoms at this time. Will continue to monitor.

## 2022-10-02 ENCOUNTER — Other Ambulatory Visit (HOSPITAL_COMMUNITY): Payer: Self-pay

## 2022-10-02 ENCOUNTER — Other Ambulatory Visit: Payer: Self-pay

## 2022-10-03 ENCOUNTER — Other Ambulatory Visit (HOSPITAL_COMMUNITY): Payer: Self-pay

## 2022-10-03 ENCOUNTER — Encounter (HOSPITAL_COMMUNITY): Payer: Self-pay

## 2022-10-04 ENCOUNTER — Other Ambulatory Visit (HOSPITAL_COMMUNITY): Payer: Medicare HMO

## 2022-10-04 ENCOUNTER — Other Ambulatory Visit: Payer: Self-pay

## 2022-10-04 ENCOUNTER — Other Ambulatory Visit (HOSPITAL_COMMUNITY): Payer: Self-pay

## 2022-10-06 ENCOUNTER — Other Ambulatory Visit (HOSPITAL_COMMUNITY): Payer: Self-pay

## 2022-10-09 ENCOUNTER — Ambulatory Visit (HOSPITAL_BASED_OUTPATIENT_CLINIC_OR_DEPARTMENT_OTHER): Payer: Medicare HMO | Admitting: Physical Therapy

## 2022-10-10 ENCOUNTER — Other Ambulatory Visit: Payer: Self-pay

## 2022-10-17 ENCOUNTER — Other Ambulatory Visit (HOSPITAL_COMMUNITY): Payer: Self-pay

## 2022-10-20 ENCOUNTER — Other Ambulatory Visit (HOSPITAL_COMMUNITY): Payer: Self-pay

## 2022-10-27 ENCOUNTER — Other Ambulatory Visit (HOSPITAL_COMMUNITY): Payer: Self-pay | Admitting: Cardiology

## 2022-10-27 ENCOUNTER — Other Ambulatory Visit (HOSPITAL_COMMUNITY): Payer: Self-pay

## 2022-10-27 ENCOUNTER — Other Ambulatory Visit: Payer: Self-pay

## 2022-10-27 MED ORDER — ASPIRIN 81 MG PO CHEW
81.0000 mg | CHEWABLE_TABLET | Freq: Every day | ORAL | 3 refills | Status: AC
  Filled 2022-10-27: qty 90, 90d supply, fill #0

## 2022-11-01 ENCOUNTER — Other Ambulatory Visit (HOSPITAL_COMMUNITY): Payer: Self-pay

## 2022-11-01 DIAGNOSIS — E113213 Type 2 diabetes mellitus with mild nonproliferative diabetic retinopathy with macular edema, bilateral: Secondary | ICD-10-CM | POA: Diagnosis not present

## 2022-11-01 IMAGING — MG DIGITAL SCREENING BILAT W/ TOMO W/ CAD
6 of 12 series · 6 of 36 positions shown · non-contrast
Comparison: Previous exam(s).

CLINICAL DATA: Screening.

EXAM:
DIGITAL SCREENING BILATERAL MAMMOGRAM WITH TOMO AND CAD

[R CC synth-2D]
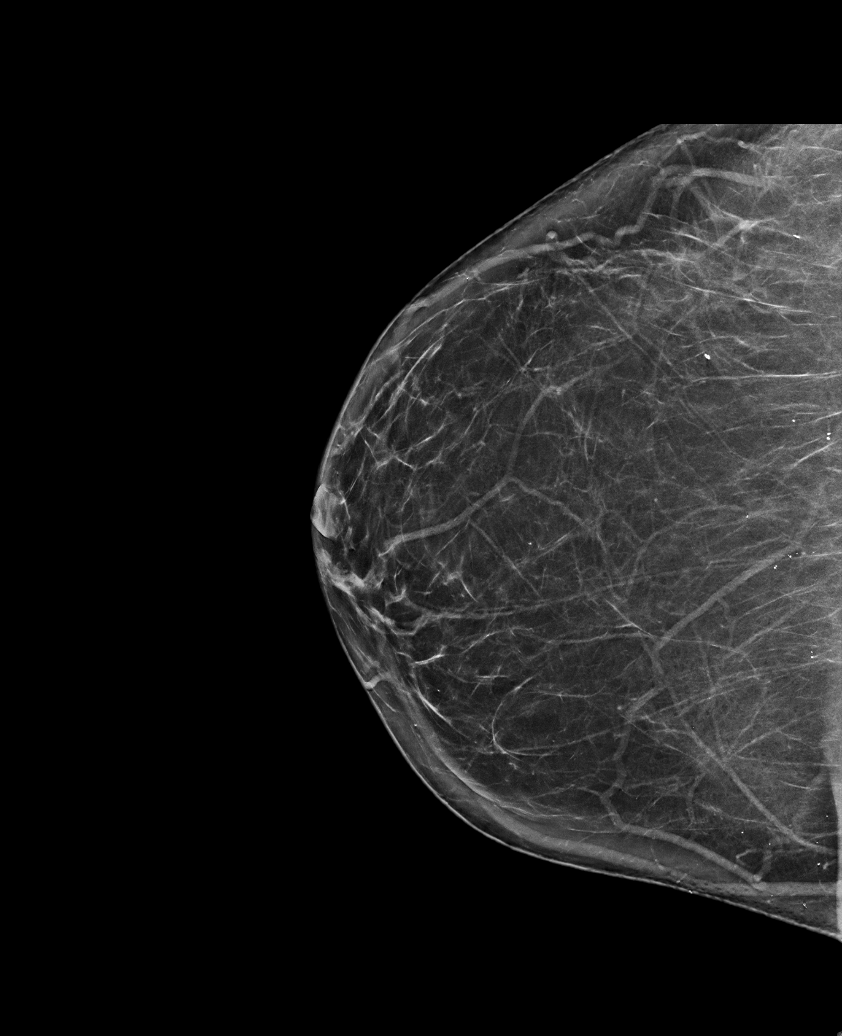

[L CC synth-2D]
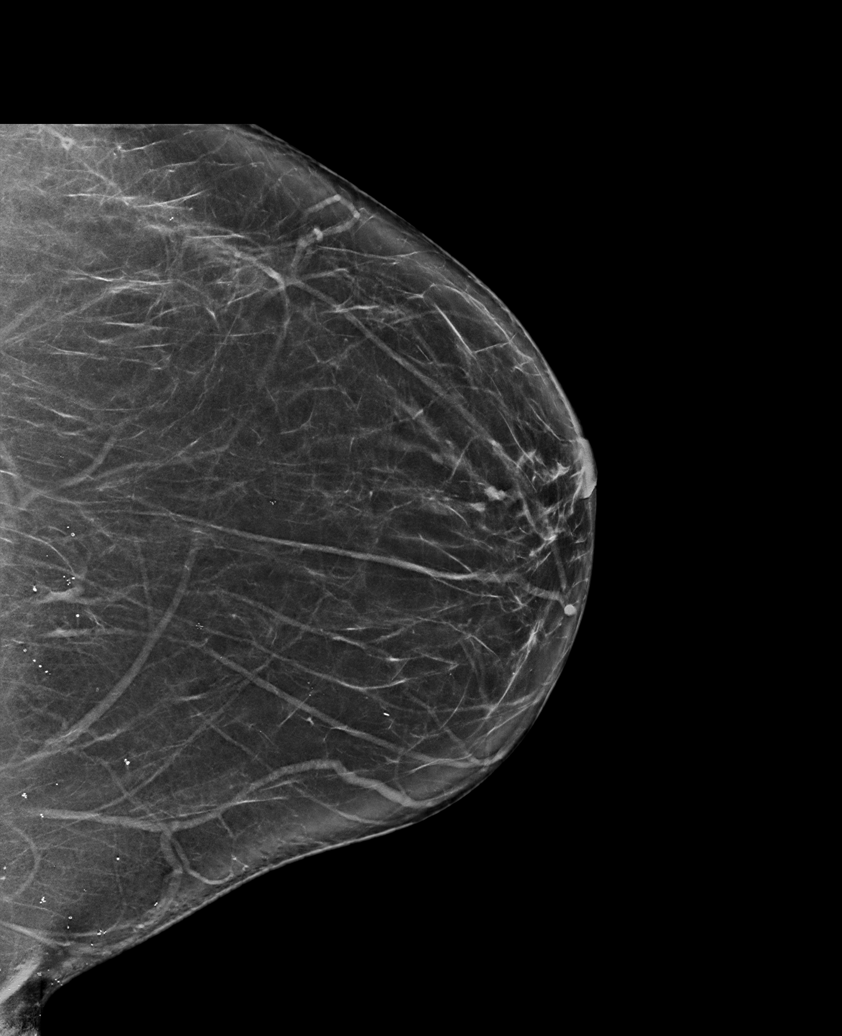

[L MLO synth-2D (1 of 2)]
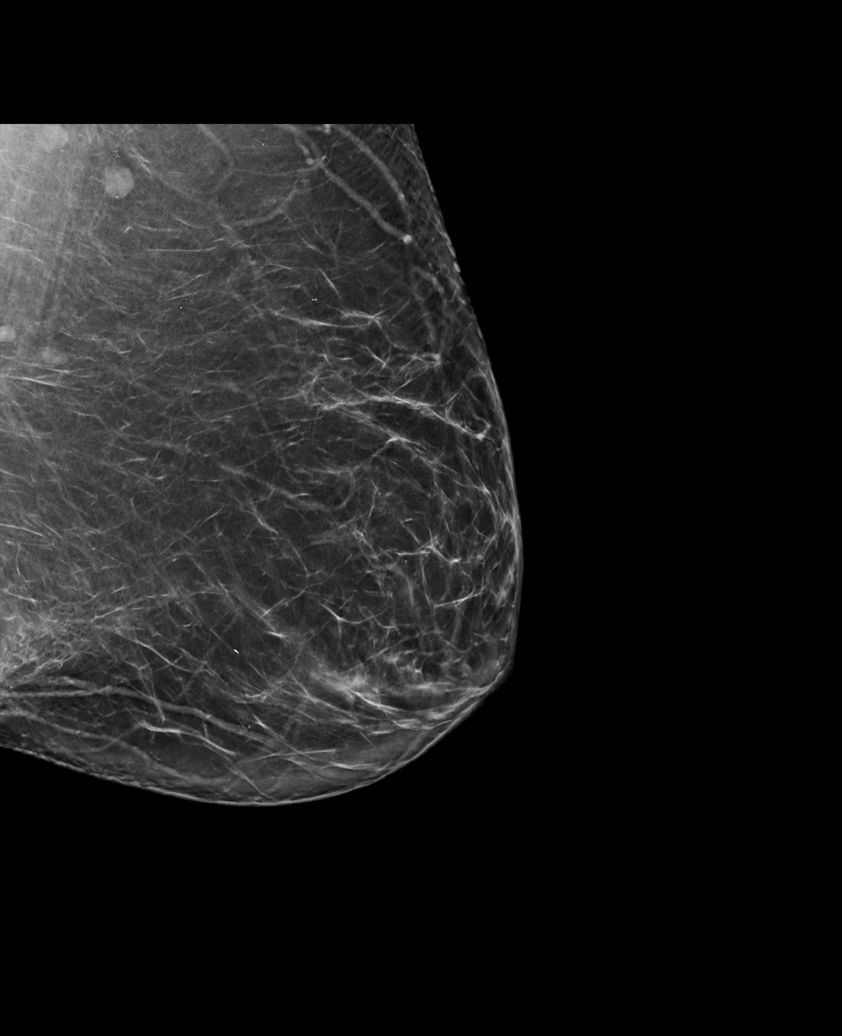

[R MLO synth-2D (1 of 2)]
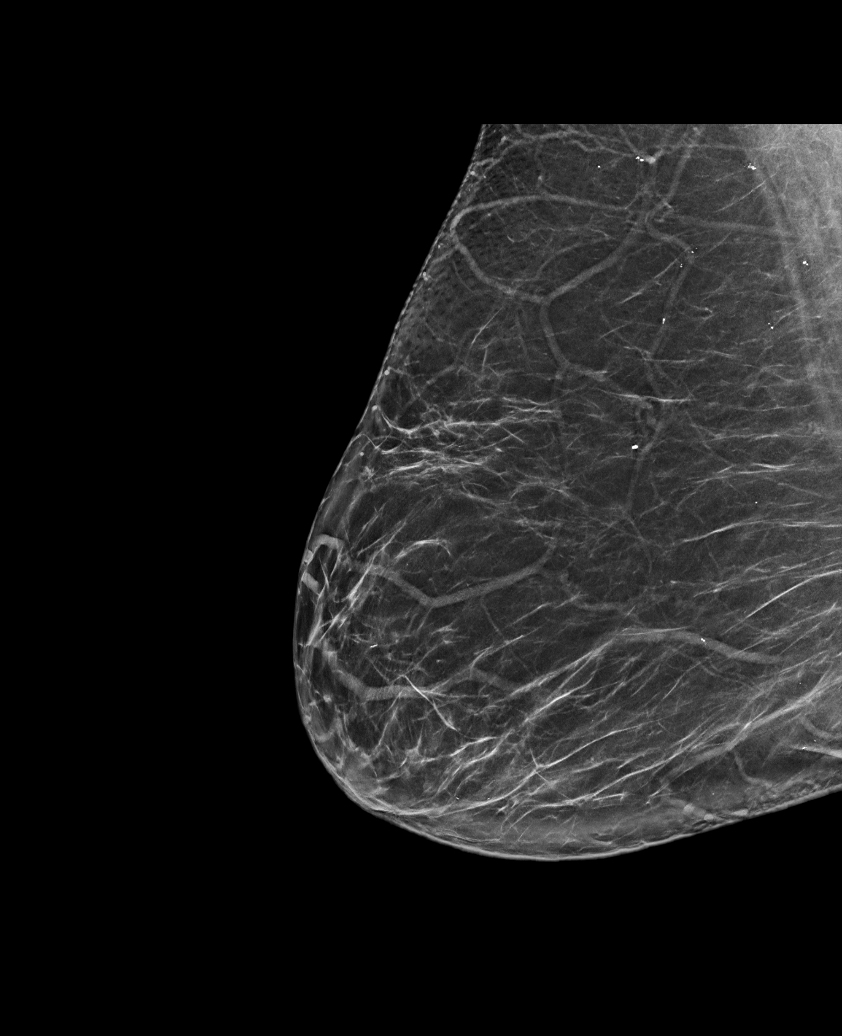

[R MLO synth-2D (2 of 2)]
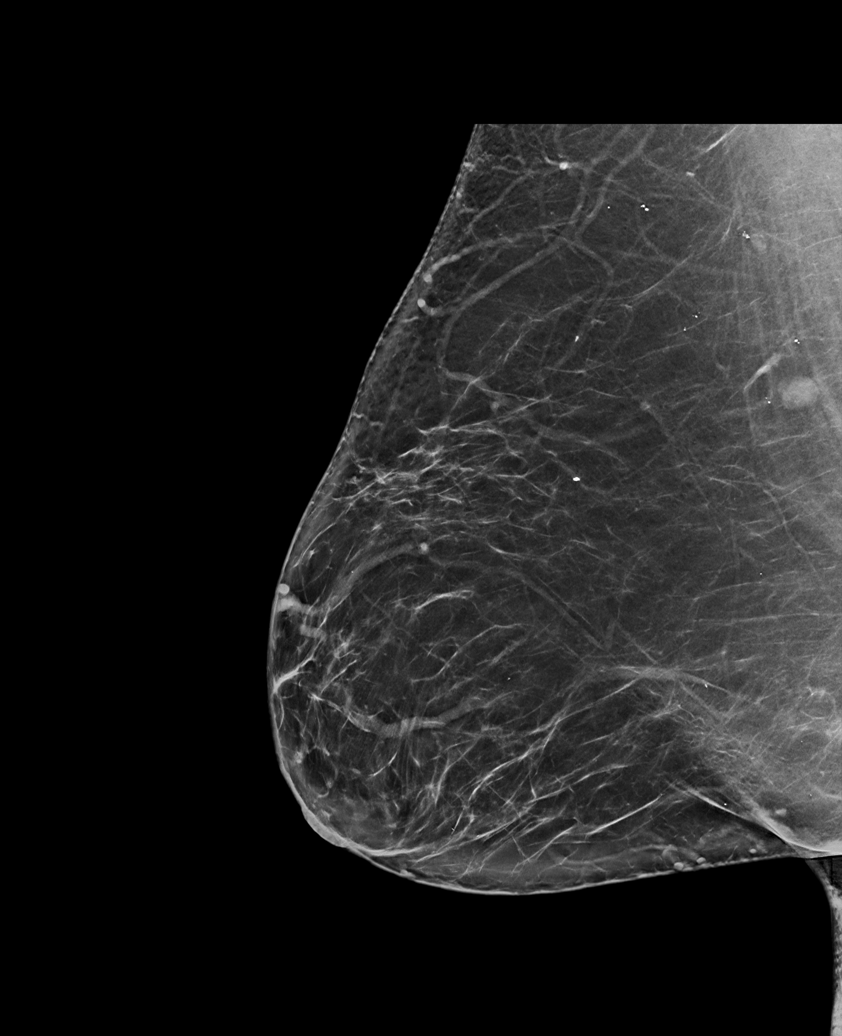

[L MLO synth-2D (2 of 2)]
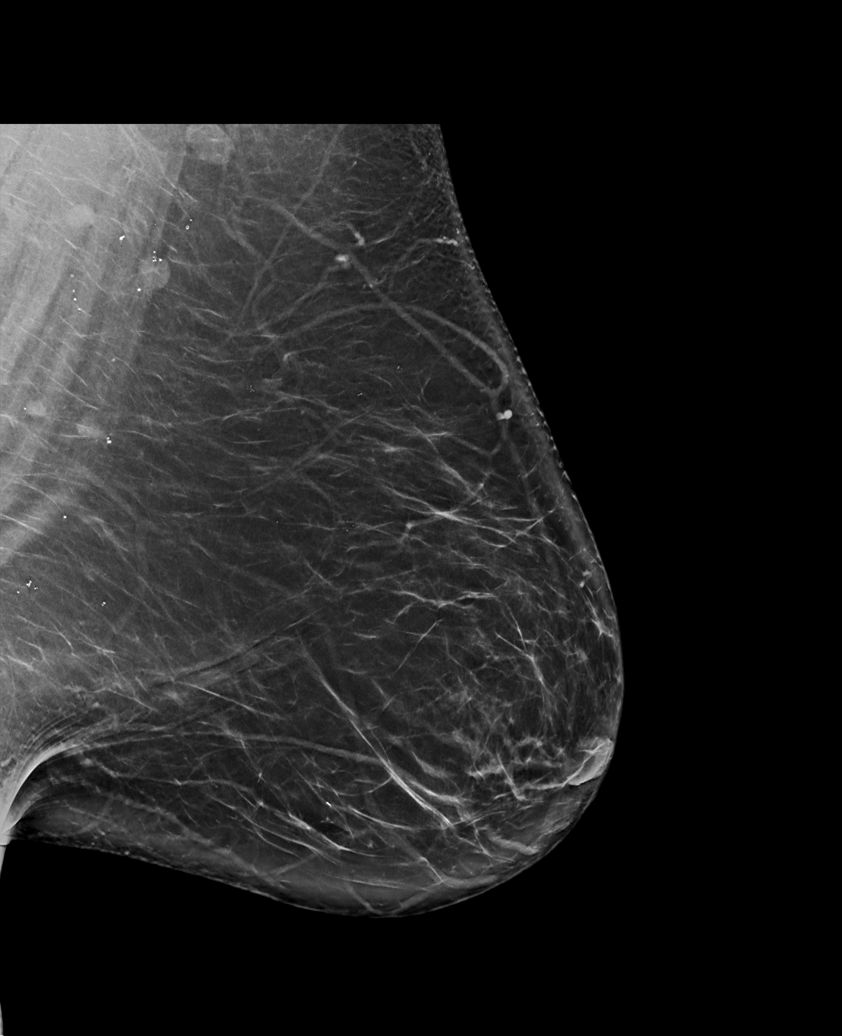

[6 of 36 positions shown; findings below may reference images not displayed]

ACR Breast Density Category b: There are scattered areas of
fibroglandular density.
FINDINGS: There are no findings suspicious for malignancy. Images were
processed with CAD.
IMPRESSION: No mammographic evidence of malignancy. A result letter of this
screening mammogram will be mailed directly to the patient.

RECOMMENDATION:
Screening mammogram in one year. (Code:[id number])

BI-RADS CATEGORY  1: Negative.

## 2022-11-03 ENCOUNTER — Other Ambulatory Visit (HOSPITAL_COMMUNITY): Payer: Self-pay

## 2022-11-03 ENCOUNTER — Other Ambulatory Visit (HOSPITAL_COMMUNITY): Payer: Self-pay | Admitting: Cardiology

## 2022-11-06 ENCOUNTER — Other Ambulatory Visit (HOSPITAL_COMMUNITY): Payer: Self-pay

## 2022-11-06 MED ORDER — CARVEDILOL 6.25 MG PO TABS
6.2500 mg | ORAL_TABLET | Freq: Two times a day (BID) | ORAL | 1 refills | Status: DC
  Filled 2022-11-06: qty 180, 90d supply, fill #0
  Filled 2023-01-23: qty 180, 90d supply, fill #1

## 2022-11-09 ENCOUNTER — Inpatient Hospital Stay: Payer: Medicare HMO | Attending: Gynecologic Oncology | Admitting: Gynecologic Oncology

## 2022-11-09 ENCOUNTER — Encounter: Payer: Self-pay | Admitting: Gynecologic Oncology

## 2022-11-09 ENCOUNTER — Other Ambulatory Visit: Payer: Self-pay

## 2022-11-09 VITALS — BP 127/67 | HR 107 | Temp 98.3°F | Wt 285.4 lb

## 2022-11-09 DIAGNOSIS — Z90710 Acquired absence of both cervix and uterus: Secondary | ICD-10-CM | POA: Insufficient documentation

## 2022-11-09 DIAGNOSIS — Z08 Encounter for follow-up examination after completed treatment for malignant neoplasm: Secondary | ICD-10-CM

## 2022-11-09 DIAGNOSIS — L910 Hypertrophic scar: Secondary | ICD-10-CM | POA: Insufficient documentation

## 2022-11-09 DIAGNOSIS — Z923 Personal history of irradiation: Secondary | ICD-10-CM | POA: Diagnosis not present

## 2022-11-09 DIAGNOSIS — C541 Malignant neoplasm of endometrium: Secondary | ICD-10-CM

## 2022-11-09 DIAGNOSIS — Z90722 Acquired absence of ovaries, bilateral: Secondary | ICD-10-CM | POA: Insufficient documentation

## 2022-11-09 DIAGNOSIS — Z8542 Personal history of malignant neoplasm of other parts of uterus: Secondary | ICD-10-CM | POA: Diagnosis not present

## 2022-11-09 DIAGNOSIS — Z9221 Personal history of antineoplastic chemotherapy: Secondary | ICD-10-CM | POA: Insufficient documentation

## 2022-11-09 NOTE — Progress Notes (Signed)
Gynecologic Oncology Return Clinic Visit  11/09/22  Reason for Visit: surveillance in the setting of endometrial cancer   Treatment History: Oncology History Overview Note  Endometrioid adenocarcinoma   History of endometrial cancer  10/27/2021 Pathology Results   FINAL MICROSCOPIC DIAGNOSIS:   A. ENDOMETRIUM, BIOPSY:  - Endometrioid adenocarcinoma with focal secretory features.  - See comment.   COMMENT:  The carcinoma has endometrioid features with a few small foci with secretory features.  There are several foci of solid pattern and the findings are consistent with FIGO grade 1/2.  Immunohistochemistry shows diffuse strong positivity estrogen receptor and Napsin A is negative.  P53 shows wild-type pattern.    11/07/2021 Initial Diagnosis   Endometrial cancer ([place])   11/14/2021 Imaging   MRI pelvis 1. Thickened (18 mm) endometrium, compatible with known endometrial malignancy. Evidence of full-thickness myometrial invasion by endometrial tumor throughout the left uterine body extending over 180 degrees of involvement. Probable early small focus of direct tumor invasion into the parametrial soft tissues in the anterior left uterine body as detailed. 2. Suspect a combination of direct endometrial tumor involvement of the upper endocervical canal and blood products within the endocervix. 3. No pelvic lymphadenopathy.  No adnexal masses.       11/24/2021 Imaging   CT abdomen and pelvis No evidence of abdominal metastatic disease or other acute findings.   Mild hepatic steatosis.   Colonic diverticulosis, without radiographic evidence of diverticulitis.   Aortic Atherosclerosis (ICD10-I70.0).     12/02/2021 Cancer Staging   Staging form: Corpus Uteri - Carcinoma and Carcinosarcoma, AJCC 8th Edition - Clinical stage from 12/02/2021: FIGO Stage IIIB (cT3b, cN0, cM0) - Signed by Heath Lark, MD on 12/02/2021 Stage prefix: Initial diagnosis   12/07/2021 Procedure   Status post  placement of right IJ port catheter   12/13/2021 - 01/10/2022 Chemotherapy   Patient is on Treatment Plan : Uterine Cisplatin days 1 and 29 + XRT      12/14/2021 - 02/01/2022 Radiation Therapy   Radiation Treatment Dates: 12/14/2021 through 02/01/2022 (IMRT : 12/14/21 through 01/17/22) (Brachytherapy - Tandem Ring : 02/01/22)  Site Technique Total Dose (Gy) Dose per Fx (Gy) Completed Fx Beam Energies  Uterus: Uterus IMRT 45/45 1.8 25/25 10X  Uterus: Uterus_Bst HDR-brachy 5.5/5.5 5.5 1/1 Ir-192        01/25/2022 Imaging   MRI pelvis Decreased endometrial thickness, consistent with interval response to therapy. Persistent deep myometrial invasion (> 50%) noted in the left lateral uterine corpus. No evidence of extra-uterine tumor extension.    No evidence of pelvic metastatic disease.   Sigmoid diverticulosis, without evidence of diverticulitis.   03/21/2022 Pathology Results   FINAL MICROSCOPIC DIAGNOSIS:   A. 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, FIGO 1  Focal residual atypical hyperplasia with squamous morular metaplasia  Tumor invades greater than 50% of the myometrium (9.75 mm of 16 mm)  (ypT1b)  Margins free  Extensive adenomyosis  Background inactive endometrium  Serosal endosalpingosis  Acute and chronic cervicitis with mucosal necrosis and numerous  nabothian cysts and tunnel clusters  Benign fallopian tubes and ovaries   ONCOLOGY TABLE:   UTERUS, CARCINOMA OR CARCINOSARCOMA: Resection   Procedure: Total hysterectomy and bilateral salpingo-oophorectomy  Histologic Type: Endometrioid adenocarcinoma  Histologic Grade: Well-differentiated, FIGO 1  Myometrial Invasion:       Depth of Myometrial Invasion (mm): 9.75 mm       Myometrial Thickness (mm):  16 mm       Percentage of Myometrial Invasion: 61%  Uterine Serosa Involvement: Not  identified  Cervical stromal Involvement: Not identified  Extent of involvement of other tissue/organs: Not identified  Peritoneal/Ascitic Fluid: Not available  Lymphovascular Invasion: Not identified  Regional Lymph Nodes: Not applicable (no lymph nodes submitted or found)   Distant Metastasis: Not applicable  Pathologic Stage Classification (pTNM, AJCC 8th Edition): ypT1b, pN n/a  Ancillary Studies: MMR / MSI has been ordered and will be reported in an  addendum  Representative Tumor Block: A7  Comment(s): The anterior endomyometrium shows extensive adenomyosis and scattered intramyometrial psammomatous calcifications.  Residual invasive tumor is identified within the posterior endomyometrium.  The otherwise benign endometrial and cervical epithelium shows apparent reactive/therapy associated atypia.  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 Obesity made retroperitoneal visualization limited and increased the complexity of the case and necessitated additional instrumentation for retraction. Obesity related complexity increased the duration of the procedure by 45 minutes.    Surgeon: Jeral Pinch MD    Operative Findings:  On EUA, 10 cm mobile uterus. On intra-abdominal entry, normal upper abdominal survey.  Omentum densely adherent to anterior abdominal wall along prior midline infraumbilical incision.  Normal-appearing small and large bowel.  Difficult intra-abdominal adiposity.  Cecum somewhat adherent to the right abdominal wall secondary to prior appendectomy.  Uterus approximately  8-10 cm and somewhat bulbous.  Normal-appearing bilateral adnexa.  No obvious adenopathy.  Some edema of the retroperitoneum.  Densely adherent bladder to the uterus and cervix from prior cesarean sections.  No gross evidence of disease. On cystoscopy, intact bladder dome. Good efflux from bilateral ureteral orifices.   04/05/2022 Procedure   Removal of implanted Port-A-Cath utilizing sharp and blunt dissection. The procedure was uncomplicated.   09/04/2022 Imaging   CT A/P: No acute abdominal or pelvic pathology. No evidence abdominal or pelvic metastatic disease.     Interval History: Doing well.  Unfortunately had a fall since her last visit with me and hurt her right leg, broke her fibula.  Is still struggling with some knee and hip pain and limited mobility.  She denies any abdominal or pelvic pain.  She denies any vaginal bleeding or discharge.  Reports baseline bowel bladder function.  Past Medical/Surgical History: Past Medical History:  Diagnosis Date   Abnormal vaginal bleeding in postmenopausal patient [id number]   Acute systolic heart failure (HCC)    Allergy    Asthma    Bilateral shoulder pain    Borderline hypertension    Bronchitis    CAD (coronary artery disease)    Chronic systolic CHF (congestive heart failure) (HCC)    Chronic systolic congestive heart failure, NYHA class 3 ([place]) 07/24/2018   Diabetes mellitus    Diagnosed in 2000, on insulin, Trulicity and metformin, not checking cgs at home   Drug-induced hypotension 12/20/2021   Gangrenous appendicitis with perforation s/p lap appendectomy 10/04/2017 10/04/2017   GERD (gastroesophageal reflux disease)    History of MI (myocardial infarction)    History of radiation therapy    Uterus- 12/14/21-02/01/22- Dr. Gery Pray   Hyperlipidemia    Hypertension    Hypothyroidism  MGUS (monoclonal gammopathy of unknown significance)    Mitral regurgitation    Myocardial infarction ([place])    Neuropathy    Obesity     Sleep apnea    uses CPAP   Statin intolerance    Uterine cancer [facility])     Past Surgical History:  Procedure Laterality Date   BREATH TEK H PYLORI  09/18/2011   Procedure: BREATH TEK H PYLORI;  Surgeon: Shann Medal, MD;  Location: [facility];  Service: General;  Laterality: N/A;   CESAREAN SECTION     x 3   IR IMAGING GUIDED PORT INSERTION  12/07/2021   IR REMOVAL TUN ACCESS W/ PORT W/O FL MOD SED  04/04/2022   LAPAROSCOPIC APPENDECTOMY N/A 10/03/2017   Procedure: APPENDECTOMY LAPAROSCOPIC;  Surgeon: Leighton Ruff, MD;  Location: [facility];  Service: General;  Laterality: N/A;   OPERATIVE ULTRASOUND N/A 02/01/2022   Procedure: OPERATIVE ULTRASOUND;  Surgeon: Gery Pray, MD;  Location: [facility];  Service: Urology;  Laterality: N/A;   RIGHT/LEFT HEART CATH AND CORONARY ANGIOGRAPHY N/A 02/12/2018   Procedure: RIGHT/LEFT HEART CATH AND CORONARY ANGIOGRAPHY;  Surgeon: Jettie Booze, MD;  Location: [facility];  Service: Cardiovascular;  Laterality: N/A;   ROBOTIC ASSISTED TOTAL HYSTERECTOMY WITH BILATERAL SALPINGO OOPHERECTOMY Bilateral 03/21/2022   Procedure: XI ROBOTIC ASSISTED TOTAL HYSTERECTOMY WITH BILATERAL SALPINGO OOPHORECTOMY, LYSIS OF ADHESIONS, CYSTOSCOPY;  Surgeon: [name], MD;  Location: [facility];  Service: Gynecology;  Laterality: Bilateral;   TANDEM RING INSERTION N/A 02/01/2022   Procedure: TANDEM RING INSERTION;  Surgeon: Gery Pray, MD;  Location: [facility];  Service: Urology;  Laterality: N/A;   TUBAL LIGATION      Family History  Problem Relation Age of Onset   Alcohol abuse Mother    Arthritis Mother    Diabetes Mother    Sleep apnea Mother    Anxiety disorder Mother    Eating disorder Mother    Obesity Mother    Cancer Father    Alcohol abuse Father    Heart disease Father    Hypertension Father    Stroke Father    Sleep apnea Sister    Breast cancer Neg Hx    Colon cancer Neg Hx    Ovarian cancer Neg Hx    Endometrial cancer Neg Hx     Pancreatic cancer Neg Hx    Prostate cancer Neg Hx     Social History   Socioeconomic History   Marital status: Married    Spouse name: Not on file   Number of children: Not on file   Years of education: Not on file   Highest education level: Not on file  Occupational History   Occupation: retired Brewing technologist, worked at [employer]  Tobacco Use   Smoking status: Former    Packs/day: 1.00    Years: 15.00    Additional pack years: 0.00    Total pack years: 15.00    Types: Cigarettes    Quit date: 01/28/1990    Years since quitting: 32.8   Smokeless tobacco: Never  Vaping Use   Vaping Use: Never used  Substance and Sexual Activity   Alcohol use: Not Currently   Drug use: No   Sexual activity: Not Currently    Birth control/protection: Post-menopausal, Abstinence  Other Topics Concern   Not on file  Social History Narrative   Lives in [place]   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   Social Determinants of  Health   Financial Resource Strain: Not on file  Food Insecurity: Not on file  Transportation Needs: Not on file  Physical Activity: Inactive (01/07/2021)   Exercise Vital Sign    Days of Exercise per Week: 0 days    Minutes of Exercise per Session: 0 min  Stress: Not on file  Social Connections: Not on file    Current Medications:  Current Outpatient Medications:    acetaminophen (TYLENOL) 500 MG tablet, Take 1,000 mg by mouth every 6 (six) hours as needed for moderate pain., Disp: , Rfl:    albuterol (PROAIR HFA) 108 (90 Base) MCG/ACT inhaler, Inhale 2 puffs into the lungs every 6 (six) hours as needed. wheezing, Disp: 18 g, Rfl: 2   aspirin 81 MG chewable tablet, Chew 1 tablet (81 mg total) by mouth daily., Disp: 90 tablet, Rfl: 3   blood glucose meter kit and supplies, Use up to four times daily as directed. (FOR ICD-10 E10.9, E11.9)., Disp: 1 each, Rfl: 99   carvedilol (COREG) 6.25 MG tablet, Take 1 tablet (6.25 mg total) by mouth 2 (two) times daily.,  Disp: 180 tablet, Rfl: 1   diphenhydrAMINE (BENADRYL) 25 MG tablet, Take 25 mg by mouth at bedtime., Disp: , Rfl:    Dulaglutide (TRULICITY) 3 0000000 SOPN, Inject 3 mg into the skin once a week., Disp: 6 mL, Rfl: 3   furosemide (LASIX) 20 MG tablet, Take 1 tablet (20 mg total) by mouth daily., Disp: 90 tablet, Rfl: 3   glucose blood test strip, Use as directed to test blood sugar, Disp: 100 each, Rfl: PRN   insulin glargine (LANTUS) 100 UNIT/ML Solostar Pen, Inject 25-40 Units into the skin daily., Disp: 45 mL, Rfl: 3   Insulin Pen Needle (UNIFINE PENTIPS) 32G X 4 MM MISC, USE AS DIRECTED ONCE A DAY, Disp: 100 each, Rfl: 3   levothyroxine (SYNTHROID) 50 MCG tablet, Take 1 tablet (50 mcg total) by mouth daily before a meal, Disp: 90 tablet, Rfl: 3   magnesium oxide (MAG-OX) 400 (240 Mg) MG tablet, Take 1 tablet (400 mg total) by mouth daily., Disp: 30 tablet, Rfl: 0   metFORMIN (GLUCOPHAGE) 1000 MG tablet, Take 1 tablet (1,000 mg total) by mouth 2 (two) times daily with a meal., Disp: 180 tablet, Rfl: 3   Multiple Vitamin (MULITIVITAMIN WITH MINERALS) TABS, Take 1 tablet by mouth daily with breakfast., Disp: , Rfl:    NON FORMULARY, Pt uses a cpap nightly, Disp: , Rfl:    OneTouch Delica Lancets [id number] MISC, use as directed, Disp: 100 each, Rfl: 1   rosuvastatin (CRESTOR) 20 MG tablet, Take 1 tablet (20 mg total) by mouth daily., Disp: 90 tablet, Rfl: 3   sacubitril-valsartan (ENTRESTO) 24-26 MG, Take 1 tablet by mouth 2 (two) times daily., Disp: 180 tablet, Rfl: 3   senna-docusate (SENOKOT-S) 8.6-50 MG tablet, Take 2 tablets by mouth at bedtime. For AFTER surgery, do not take if having diarrhea, Disp: 30 tablet, Rfl: 0   spironolactone (ALDACTONE) 25 MG tablet, Take 1 tablet (25 mg total) by mouth daily., Disp: 90 tablet, Rfl: 3   triamcinolone ointment (KENALOG) 0.1 %, Apply 1 application topically two times daily to affected area(s) as needed, sparing use to avoid whitening/thinning skin, Disp: 30  g, Rfl: 1  Review of Systems: + Joint pain Denies appetite changes, fevers, chills, fatigue, unexplained weight changes. Denies hearing loss, neck lumps or masses, mouth sores, ringing in ears or voice changes. Denies cough or wheezing.  Denies shortness  of breath. Denies chest pain or palpitations. Denies leg swelling. Denies abdominal distention, pain, blood in stools, constipation, diarrhea, nausea, vomiting, or early satiety. Denies pain with intercourse, dysuria, frequency, hematuria or incontinence. Denies hot flashes, pelvic pain, vaginal bleeding or vaginal discharge.   Denies back pain or muscle pain/cramps. Denies itching, rash, or wounds. Denies dizziness, headaches, numbness or seizures. Denies swollen lymph nodes or glands, denies easy bruising or bleeding. Denies anxiety, depression, confusion, or decreased concentration.  Physical Exam: BP 127/67 (BP Location: Left Arm, Patient Position: Sitting)   Pulse (!) 107   Temp 98.3 F (36.8 C) (Oral)   Wt 285 lb 6.4 oz (129.5 kg)   SpO2 99%   BMI 44.70 kg/m  General: Alert, oriented, no acute distress. HEENT: Normocephalic, atraumatic, sclera anicteric. Chest: Clear to auscultation bilaterally.  No wheezes or rhonchi.  Keloid scar over her prior port site. Cardiovascular: Regular rate and rhythm, no murmurs. Abdomen: Obese, soft, nontender.  Normoactive bowel sounds.  No masses or hepatosplenomegaly appreciated.  Well-healed incisions. Extremities: Grossly normal range of motion.  Warm, well perfused.  Trace edema bilaterally. Lymphatics: No cervical, supraclavicular, or inguinal adenopathy. GU: Normal appearing external genitalia without erythema, excoriation, or lesions.  Speculum exam reveals moderately atrophic vaginal mucosa, vaginal apex with some radiation changes.  No masses or atypical vascularity.  Bimanual exam reveals cuff intact, no nodularity or firmness.  Rectovaginal exam confirms findings.  Laboratory &  Radiologic Studies: CT A/P on 09/04/22: IMPRESSION: No acute abdominal or pelvic pathology. No evidence abdominal or pelvic metastatic disease.  Assessment & Plan: Terri Heath is a 67 y.o. 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 Surgery 02/2022. p53 wildtype. 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  MSI high.   Patient is doing well and continues to be NED on exam today. We reviewed again CT scan in January which was negative for evidence of recurrent disease.  Per NCCN surveillance recommendations, we will plan on surveillance visits every 3 months for the first 2-3 years.  We discussed signs and symptoms that would be concerning for cancer recurrence, and I stressed the importance of calling if she develops any of these.  She has some tenderness of the incision from her port removal, which developed a keloid as it was healing.  She is using topical lidocaine.  We discussed using massage.  If she continues to have more pain related to this, I offered surgical excision of the scar.  20 minutes of total time was spent for this patient encounter, including preparation, face-to-face counseling with the patient and coordination of care, and documentation of the encounter.  Jeral Pinch, MD  Division of Gynecologic Oncology  Department of Obstetrics and Gynecology  [facility]

## 2022-11-09 NOTE — Patient Instructions (Signed)
It was good to see you today.  I do not see or feel any evidence of cancer recurrence on your exam.  I will see you for follow-up in 3 months.  As always, if you develop any new and concerning symptoms before your next visit, please call to see me sooner.  

## 2022-11-16 DIAGNOSIS — H2513 Age-related nuclear cataract, bilateral: Secondary | ICD-10-CM | POA: Diagnosis not present

## 2022-11-16 DIAGNOSIS — H43823 Vitreomacular adhesion, bilateral: Secondary | ICD-10-CM | POA: Diagnosis not present

## 2022-11-16 DIAGNOSIS — E113213 Type 2 diabetes mellitus with mild nonproliferative diabetic retinopathy with macular edema, bilateral: Secondary | ICD-10-CM | POA: Diagnosis not present

## 2022-11-24 ENCOUNTER — Ambulatory Visit (HOSPITAL_COMMUNITY)
Admission: RE | Admit: 2022-11-24 | Discharge: 2022-11-24 | Disposition: A | Discharge status: Home / Self Care | Payer: Medicare HMO | Source: Ambulatory Visit | Attending: Internal Medicine | Admitting: Internal Medicine

## 2022-11-24 DIAGNOSIS — I5022 Chronic systolic (congestive) heart failure: Secondary | ICD-10-CM | POA: Diagnosis not present

## 2022-11-24 DIAGNOSIS — E782 Mixed hyperlipidemia: Secondary | ICD-10-CM | POA: Diagnosis not present

## 2022-11-24 LAB — LIPID PANEL
Cholesterol: 238 mg/dL — ABNORMAL HIGH (ref 0–200)
HDL: 47 mg/dL (ref >40)
LDL Cholesterol: 158 mg/dL — ABNORMAL HIGH (ref 0–99)
Total CHOL/HDL Ratio: 5.1 RATIO
Triglycerides: 165 mg/dL — ABNORMAL HIGH (ref <150)
VLDL: 33 mg/dL (ref 0–40)

## 2022-11-24 LAB — BASIC METABOLIC PANEL
Anion gap: 10 (ref 5–15)
BUN: 17 mg/dL (ref 8–23)
CO2: 27 mmol/L (ref 22–32)
Calcium: 9.6 mg/dL (ref 8.9–10.3)
Chloride: 101 mmol/L (ref 98–111)
Creatinine, Ser: 0.91 mg/dL (ref 0.44–1.00)
GFR, Estimated: >60 mL/min (ref >60)
Glucose, Bld: 166 mg/dL — ABNORMAL HIGH (ref 70–99)
Potassium: 5 mmol/L (ref 3.5–5.1)
Sodium: 138 mmol/L (ref 135–145)

## 2022-11-24 LAB — HEPATIC FUNCTION PANEL
ALT: 19 U/L (ref 0–44)
AST: 18 U/L (ref 15–41)
Albumin: 3.1 g/dL — ABNORMAL LOW (ref 3.5–5.0)
Alkaline Phosphatase: 70 U/L (ref 38–126)
Bilirubin, Direct: <0.1 mg/dL (ref 0.0–0.2)
Total Bilirubin: 0.5 mg/dL (ref 0.3–1.2)
Total Protein: 7.3 g/dL (ref 6.5–8.1)

## 2022-11-27 ENCOUNTER — Other Ambulatory Visit: Payer: Self-pay | Admitting: Cardiology

## 2022-11-27 ENCOUNTER — Other Ambulatory Visit (HOSPITAL_COMMUNITY): Payer: Self-pay

## 2022-11-27 ENCOUNTER — Other Ambulatory Visit: Payer: Self-pay

## 2022-11-27 ENCOUNTER — Telehealth (HOSPITAL_COMMUNITY): Payer: Self-pay | Admitting: Cardiology

## 2022-11-27 DIAGNOSIS — E782 Mixed hyperlipidemia: Secondary | ICD-10-CM

## 2022-11-27 NOTE — Telephone Encounter (Signed)
Larey Dresser, MD 11/24/2022  6:14 PM EDT Back to Top    LDL is very high. What is she taking? If nothing & willing to restart crestor 20, she should do that. If cannot take statin due to side effects, send to lipid clinic for repatha. LDL goal is < 55 with significant coronary disease    Pt aware of results Pt does not wish to restart crestor as they do not work for her Order for lipid clinic placed

## 2022-11-28 ENCOUNTER — Other Ambulatory Visit (HOSPITAL_COMMUNITY): Payer: Self-pay

## 2022-11-28 ENCOUNTER — Telehealth: Payer: Self-pay | Admitting: Internal Medicine

## 2022-11-28 MED ORDER — REPATHA SURECLICK 140 MG/ML ~~LOC~~ SOAJ
SUBCUTANEOUS | 0 refills | Status: DC
  Filled 2022-11-28: qty 2, 28d supply, fill #0

## 2022-11-28 NOTE — Telephone Encounter (Signed)
P.A.form filled out and faxed. Waiting on response

## 2022-11-29 ENCOUNTER — Telehealth: Payer: Self-pay | Admitting: Nurse Practitioner

## 2022-11-29 ENCOUNTER — Other Ambulatory Visit (HOSPITAL_COMMUNITY): Payer: Self-pay

## 2022-11-29 DIAGNOSIS — E1159 Type 2 diabetes mellitus with other circulatory complications: Secondary | ICD-10-CM

## 2022-11-29 NOTE — Telephone Encounter (Signed)
Contacted [facility] to schedule their annual wellness visit. Welcome to Medicare visit Due by 01/27/23.  [employer] direct phone # [phone]    WTM before 01/27/23 per [facility]

## 2022-11-29 NOTE — Telephone Encounter (Signed)
This is covered under patient's medicare Part D benefit but unable to approve the request to lower the tier/co pay amount. The list that was given for alternatives, They are still in tier 3 like Trulicity. Please advise

## 2022-11-29 NOTE — Telephone Encounter (Signed)
Please call  Pt called and Trulicity too expensive and she needs something less expensive Was $45 but now $200 this month   wants to know if cheaper alternative

## 2022-12-01 ENCOUNTER — Other Ambulatory Visit: Payer: Self-pay

## 2022-12-01 NOTE — Telephone Encounter (Signed)
I have faxed back request with more information

## 2022-12-05 ENCOUNTER — Other Ambulatory Visit (HOSPITAL_COMMUNITY): Payer: Self-pay

## 2022-12-05 MED ORDER — TIRZEPATIDE 2.5 MG/0.5ML ~~LOC~~ SOAJ
2.5000 mg | SUBCUTANEOUS | 0 refills | Status: DC
  Filled 2022-12-05 – 2022-12-25 (×2): qty 2, 28d supply, fill #0

## 2022-12-05 NOTE — Telephone Encounter (Signed)
If she stops the medication she will likely see a rise in her blood sugar levels, possibly fairly significantly. We can consider starting something else to help with control. Glipizide may be a good option. If she would like to try this, OK to send in Glipizide 5mg  BID. We will plan to follow-up with her labs at her next appt.

## 2022-12-05 NOTE — Telephone Encounter (Signed)
Please let Terri Heath know that I have sent in [facility] to see if we can get this covered. This is a once a week injection. It has excellent results with diabetes and weight management so I think it would be a great option for her. We will start on the lowest dose and taper up. If there are issues with insurance, we will look for other options.

## 2022-12-05 NOTE — Telephone Encounter (Signed)
This has been denied. Their is no other biologic drug in a lower tier that is covered

## 2022-12-11 DIAGNOSIS — E113293 Type 2 diabetes mellitus with mild nonproliferative diabetic retinopathy without macular edema, bilateral: Secondary | ICD-10-CM | POA: Diagnosis not present

## 2022-12-11 DIAGNOSIS — H25043 Posterior subcapsular polar age-related cataract, bilateral: Secondary | ICD-10-CM | POA: Diagnosis not present

## 2022-12-11 DIAGNOSIS — H25013 Cortical age-related cataract, bilateral: Secondary | ICD-10-CM | POA: Diagnosis not present

## 2022-12-11 DIAGNOSIS — H2513 Age-related nuclear cataract, bilateral: Secondary | ICD-10-CM | POA: Diagnosis not present

## 2022-12-11 DIAGNOSIS — H2512 Age-related nuclear cataract, left eye: Secondary | ICD-10-CM | POA: Diagnosis not present

## 2022-12-11 LAB — HM DIABETES EYE EXAM

## 2022-12-12 ENCOUNTER — Other Ambulatory Visit (HOSPITAL_COMMUNITY): Payer: Self-pay

## 2022-12-20 ENCOUNTER — Ambulatory Visit (HOSPITAL_BASED_OUTPATIENT_CLINIC_OR_DEPARTMENT_OTHER): Payer: Medicare HMO | Attending: Family Medicine | Admitting: Physical Therapy

## 2022-12-20 ENCOUNTER — Other Ambulatory Visit: Payer: Self-pay | Admitting: Cardiology

## 2022-12-20 ENCOUNTER — Other Ambulatory Visit: Payer: Self-pay

## 2022-12-20 ENCOUNTER — Encounter (HOSPITAL_BASED_OUTPATIENT_CLINIC_OR_DEPARTMENT_OTHER): Payer: Self-pay | Admitting: Physical Therapy

## 2022-12-20 DIAGNOSIS — R262 Difficulty in walking, not elsewhere classified: Secondary | ICD-10-CM | POA: Diagnosis not present

## 2022-12-20 DIAGNOSIS — M6281 Muscle weakness (generalized): Secondary | ICD-10-CM | POA: Diagnosis not present

## 2022-12-20 DIAGNOSIS — S82831D Other fracture of upper and lower end of right fibula, subsequent encounter for closed fracture with routine healing: Secondary | ICD-10-CM | POA: Diagnosis not present

## 2022-12-20 NOTE — Therapy (Signed)
OUTPATIENT PHYSICAL THERAPY LOWER EXTREMITY EVALUATION   Patient Name: Terri Heath MRN: [id number] DOB:[birth year], 67 y.o., female Today's Date: 12/20/2022  END OF SESSION:  PT End of Session - 12/20/22 1543     Visit Number 1    Number of Visits 19    Date for PT Re-Evaluation 03/20/23    Authorization Type [employer] MCR    PT Start Time 1531    PT Stop Time 1615    PT Time Calculation (min) 44 min    Activity Tolerance Patient tolerated treatment well    Behavior During Therapy WFL for tasks assessed/performed             Past Medical History:  Diagnosis Date   Abnormal vaginal bleeding in postmenopausal patient 10/11/2021   Acute systolic heart failure    Allergy    Asthma    Bilateral shoulder pain    Borderline hypertension    Bronchitis    CAD (coronary artery disease)    Chronic systolic CHF (congestive heart failure)    Chronic systolic congestive heart failure, NYHA class 3 07/24/2018   Diabetes mellitus    Diagnosed in 2000, on insulin, Trulicity and metformin, not checking cgs at home   Drug-induced hypotension 12/20/2021   Gangrenous appendicitis with perforation s/p lap appendectomy 10/04/2017 10/04/2017   GERD (gastroesophageal reflux disease)    History of MI (myocardial infarction)    History of radiation therapy    Uterus- 12/14/21-02/01/22- Dr. Antony Blackbird   Hyperlipidemia    Hypertension    Hypothyroidism    MGUS (monoclonal gammopathy of unknown significance)    Mitral regurgitation    Myocardial infarction    Neuropathy    Obesity    Sleep apnea    uses CPAP   Statin intolerance    Uterine cancer    Past Surgical History:  Procedure Laterality Date   BREATH TEK H PYLORI  09/18/2011   Procedure: BREATH TEK H PYLORI;  Surgeon: Kandis Cocking, MD;  Location: [facility];  Service: General;  Laterality: N/A;   CESAREAN SECTION     x 3   IR IMAGING GUIDED PORT INSERTION  12/07/2021   IR REMOVAL TUN ACCESS W/ PORT W/O FL MOD SED   04/04/2022   LAPAROSCOPIC APPENDECTOMY N/A 10/03/2017   Procedure: APPENDECTOMY LAPAROSCOPIC;  Surgeon: Romie Levee, MD;  Location: [facility];  Service: General;  Laterality: N/A;   OPERATIVE ULTRASOUND N/A 02/01/2022   Procedure: OPERATIVE ULTRASOUND;  Surgeon: Antony Blackbird, MD;  Location: [facility];  Service: Urology;  Laterality: N/A;   RIGHT/LEFT HEART CATH AND CORONARY ANGIOGRAPHY N/A 02/12/2018   Procedure: RIGHT/LEFT HEART CATH AND CORONARY ANGIOGRAPHY;  Surgeon: Corky Crafts, MD;  Location: [facility];  Service: Cardiovascular;  Laterality: N/A;   ROBOTIC ASSISTED TOTAL HYSTERECTOMY WITH BILATERAL SALPINGO OOPHERECTOMY Bilateral 03/21/2022   Procedure: XI ROBOTIC ASSISTED TOTAL HYSTERECTOMY WITH BILATERAL SALPINGO OOPHORECTOMY, LYSIS OF ADHESIONS, CYSTOSCOPY;  Surgeon: Carver Fila, MD;  Location: [facility];  Service: Gynecology;  Laterality: Bilateral;   TANDEM RING INSERTION N/A 02/01/2022   Procedure: TANDEM RING INSERTION;  Surgeon: Antony Blackbird, MD;  Location: [facility];  Service: Urology;  Laterality: N/A;   TUBAL LIGATION     Patient Active Problem List   Diagnosis Date Noted   Closed fracture of distal fibula 07/17/2022   Hypomagnesemia 01/17/2022   Pancytopenia, acquired 01/03/2022   Dehydration 12/20/2021   Elevated serum creatinine 12/20/2021   Diabetic peripheral neuropathy associated with type 2  diabetes mellitus 12/02/2021   History of endometrial cancer 11/07/2021   Encounter to establish care 01/06/2021   Vitamin D deficiency 01/06/2021   Rash of back 01/06/2021   Claudication in peripheral vascular disease 09/16/2020   Chronic systolic CHF (congestive heart failure), NYHA class 3 07/24/2018   Acquired hypothyroidism 07/24/2018   Cardiomyopathy, ischemic 07/24/2018   MGUS (monoclonal gammopathy of unknown significance) 03/06/2018   Dyspnea 01/14/2018   Allergic rhinitis 06/11/2014   Asthma due to seasonal allergies 10/24/2012   OSA (obstructive sleep  apnea) 05/23/2011   Poorly controlled type 2 diabetes mellitus with circulatory disorder 03/26/2007   Hyperlipidemia 03/26/2007   Obesity, Class III, BMI 40-49.9 (morbid obesity) 03/26/2007   Hypertension associated with type 2 diabetes mellitus 03/26/2007    PCP: Tollie Eth, NP   REFERRING PROVIDER: de [place], Raymond J, MD   REFERRING DIAG:  S82.831D (ICD-10-CM) - Closed fracture of distal end of right fibula with routine healing, unspecified fracture morphology, subsequent encounter      THERAPY DIAG:  Muscle weakness (generalized)  Difficulty walking  Rationale for Evaluation and Treatment: Rehabilitation  ONSET DATE: 07/10/2022 Initial Fall  SUBJECTIVE:   SUBJECTIVE STATEMENT:  Pt states her real problem stems from having her uterine cancer removed in 2023. Her fall that caused her ankle fx was due to the weakness into her LE vs ankle instability or faintness. She is here for weakness and deconditioning. Her ankle is not her major concern. She has difficulty with transfers and daily mobility. She has trouble with lifting type task and ADL due to the weakness. Doing laundry and unloading the dishwasher are difficult due to the low height. Pt has to have groceries delivered bc she is unable to walk the store but has trouble with getting them inside the house. Only able to walk for ~15 is minutes before stopping. Pt is most limited with ambulation and has to go down stairs backwards. Previously did aquatic exercise prior to cancer surgery. Comfortable with the water setting.   PERTINENT HISTORY: CHF, DM neuropathy (sock distribution), CAD, previous uterine cancer PAIN:  Are you having pain? No : NPRS scale: 0/10   PRECAUTIONS: None  WEIGHT BEARING RESTRICTIONS: No  FALLS:  Has patient fallen in last 6 months? Yes. Number of falls 2, caught foot on an ottoman   LIVING ENVIRONMENT: Lives with: lives with their family and lives with their spouse Lives in:  House/apartment Stairs: Yes, modified rise  Has following equipment at home: Single point cane, rails in bathroom   OCCUPATION: previously [employer], EKG   PLOF: Independent with basic ADLs  PATIENT GOALS: return to normal ADL, walk without a cane, "feel stronger":    OBJECTIVE:   DIAGNOSTIC FINDINGS:   FINDINGS: Similar-appearing mildly displaced distal fibular fracture. No new periosteal reaction identified. There is no evidence of new acute fracture, dislocation, or joint effusion. There is no evidence of arthropathy or other focal bone abnormality. Soft tissues are unremarkable.   IMPRESSION: Similar-appearing mildly displaced distal fibular fracture. No new periosteal reaction identified.    PATIENT SURVEYS:  Lower Extremity Functional Score: 16 / 80 = 20.0 %  COGNITION: Overall cognitive status: Within functional limits for tasks assessed     SENSATION: Light touch: Impaired   POSTURE: rounded shoulders, increased thoracic kyphosis, anterior pelvic tilt, and flexed trunk    LOWER EXTREMITY ROM:  AROM of bilat hips limited by proximal LE weakness and body habitus; L LE AROM at the hip most limited with  flexion and ER position  LOWER EXTREMITY MMT:  MMT Right eval Left eval  Hip flexion 4/5 4/5  Hip extension 4/5 4/5  Hip abduction 4/5 4/5  Hip adduction    Hip internal rotation    Hip external rotation 4/5 4/5  Knee flexion 4/5 4/5  Knee extension 4/5 4/5   (Blank rows = not tested)    FUNCTIONAL TESTS:  5 times sit to stand: 28.7s  TUG (modified): measured 68ft from treatment room door 16.5s  Unable to perform tandem or SLS  GAIT: Distance walked: 67ft Assistive device utilized: Single point cane Level of assistance: Modified independence Comments: bilat compensated [place], decreased L step length, APT   TODAY'S TREATMENT:                                                                                                                               DATE: 4/24   Program Notes Hourly walk from sitting  Exercises - Sit to Stand with Resistance Around Legs  - 1-2 x daily - 7 x weekly - 2 sets - 5 reps - Seated Hip Abduction with Resistance  - 1-2 x daily - 7 x weekly - 2 sets - 10 reps - 3 hold - Seated March  - 1-2 x daily - 7 x weekly - 3 sets - 10 reps     PATIENT EDUCATION:  Education details: MOI, diagnosis, prognosis, anatomy, exercise progression, DOMS expectations, muscle firing,  envelope of function, HEP, POC  Person educated: Patient Education method: Explanation, Demonstration, Tactile cues, Verbal cues, and Handouts Education comprehension: verbalized understanding, returned demonstration, verbal cues required, and tactile cues required  HOME EXERCISE PROGRAM:  Access Code: [email or web address] URL: [email or web address] Date: 12/20/2022 Prepared by: Zebedee Iba  ASSESSMENT:  CLINICAL IMPRESSION: Patient is a 67 y.o. female who was seen today for physical therapy evaluation and treatment for c/c of generalized weakness and deconditioning. Pt's s/s appear consistent with lumbopelvic weakness and deconditioning follow major pelvic surgery. Pt's is not pain limited with exercise/movement but rather fatigue and weakness. Pt's is strength limited this time. Plan to continue with aquatic and land based at future sessions for progressive mobility. Pt would benefit from continued skilled therapy in order to reach goals and maximize functional generalized strength and ROM for full return to PLOF.    OBJECTIVE IMPAIRMENTS: decreased activity tolerance, decreased balance, decreased endurance, decreased mobility, decreased ROM, decreased strength, hypomobility, increased muscle spasms, impaired flexibility, improper body mechanics, postural dysfunction, obesity, and pain.    ACTIVITY LIMITATIONS: lifting, squatting, locomotion level, and dressing   PARTICIPATION LIMITATIONS: interpersonal relationship, community  activity, occupation, and exercise   PERSONAL FACTORS: Age, Fitness, Lifestyle, Past/current experiences, and Time since onset of injury/illness/exacerbation, and 2-3 comorbidities are also affecting patient's functional outcome.    REHAB POTENTIAL: Fair   CLINICAL DECISION MAKING: unstable/complicated   EVALUATION COMPLEXITY: Moderate     GOALS:     SHORT TERM GOALS: Target date: 01/31/2023  Pt will become independent with HEP in order to demonstrate synthesis of PT education.   Goal status: INITIAL   2. Pt will be able to demonstrate full hip/knee AROM in order to demonstrate functional improvement in LE function for self-care and house hold duties.      Goal status: INITIAL   3.  Pt will report at least 2 pt reduction on NPRS scale for pain in order to demonstrate functional improvement with household activity, self care, and ADL.    Goal status: INITIAL   LONG TERM GOALS: Target date: 03/14/2023       Pt  will become independent with final HEP in order to demonstrate synthesis of PT education.   Goal status: INITIAL   2.  Pt will have an at least 18 pt improvement in LEFS measure in order to demonstrate MCID improvement in daily function.    Goal status: INITIAL   3.  Pt will be able to lift/squat/hold >10 lbs in order to demonstrate functional improvement in lumbopelvic strength for return to ADL and household activity.    Goal status: INITIAL   4.  Pt will be able to demonstrate reciprocal stair stepping with ascent and descent with single UE in order to demonstrate functional improvement in LE function for self-care and community ambulation.   Goal status: INITIAL   5. Pt will be able to demonstrate/report ability to walk >20 mins in order to demonstrate functional improvement and tolerance to exercise and community mobility.   Goal status: INITIAL     PLAN:   PT FREQUENCY: 1-2x/week   PT DURATION: 12 weeks (plan to D/C in 8-10 wks)   PLANNED  INTERVENTIONS: Therapeutic exercises, Therapeutic activity, Neuromuscular re-education, Balance training, Gait training, Patient/Family education, Self Care, Joint mobilization, Joint manipulation, Stair training, Aquatic Therapy, Dry Needling, Electrical stimulation, Spinal manipulation, Spinal mobilization, Cryotherapy, Moist heat, scar mobilization, Splintting, Taping, Vasopneumatic device, Traction, Ultrasound, Ionotophoresis /ml Dexamethasone, Manual therapy, and Re-evaluation   PLAN FOR NEXT SESSION: when able, intro to aquatics: general conditioning, LE strength and exercise     Zebedee Iba, PT 12/20/2022, 5:21 PM   Referring diagnosis? M62.81, r26.2 Treatment diagnosis? (if different than referring diagnosis)  S82.831D (ICD-10-CM) - Closed fracture of distal end of right fibula with routine healing, unspecified fracture morphology, subsequent encounter   What was this (referring dx) caused by?  Surgery  Fall  Ongoing issue  Arthritis  Other: ____________  Laterality:  Rt  Lt  Both  Check all possible CPT codes:       97110 (Therapeutic Exercise)   92507 (SLP Treatment)   97112 (Neuro Re-ed)    92526 (Swallowing Treatment)    97116 (Gait Training)    [id number] (Cognitive Training, 1st 15 minutes)  97140 (Manual Therapy)    97130 (Cognitive Training, each add'l 15 minutes)   97530 (Therapeutic Activities)   Other, List CPT Code ____________     97535 (Self Care)        All codes above (97110 - 97535)   97012 (Mechanical Traction)   97014 (E-stim Unattended)   97032 (E-stim manual)   97033 (Ionto)   97035 (Ultrasound)   97760 (Orthotic Fit)  [id number] (Physical Performance Training)  [id number] (Aquatic Therapy)  97034 (Contrast Bath)  [id number] (Paraffin)  16109 (Wound Care 1st 20 sq cm)  97598 (Wound Care each add'l 20 sq cm)  97016 (Vasopneumatic Device)  [id number] Public affairs consultant)  [id number]  (Prosthetic Training)

## 2022-12-21 ENCOUNTER — Other Ambulatory Visit (HOSPITAL_COMMUNITY): Payer: Self-pay

## 2022-12-21 ENCOUNTER — Ambulatory Visit (HOSPITAL_BASED_OUTPATIENT_CLINIC_OR_DEPARTMENT_OTHER): Payer: Medicare HMO | Admitting: Physical Therapy

## 2022-12-21 MED ORDER — REPATHA SURECLICK 140 MG/ML ~~LOC~~ SOAJ
140.0000 mg | SUBCUTANEOUS | 0 refills | Status: DC
  Filled 2022-12-21: qty 2, 28d supply, fill #0

## 2022-12-25 ENCOUNTER — Other Ambulatory Visit (HOSPITAL_COMMUNITY): Payer: Self-pay

## 2022-12-26 ENCOUNTER — Encounter: Payer: Self-pay | Admitting: Nurse Practitioner

## 2022-12-26 ENCOUNTER — Ambulatory Visit (INDEPENDENT_AMBULATORY_CARE_PROVIDER_SITE_OTHER): Payer: Medicare HMO | Admitting: Nurse Practitioner

## 2022-12-26 VITALS — BP 120/72 | HR 96 | Wt 295.2 lb

## 2022-12-26 DIAGNOSIS — D61818 Other pancytopenia: Secondary | ICD-10-CM | POA: Diagnosis not present

## 2022-12-26 DIAGNOSIS — E1165 Type 2 diabetes mellitus with hyperglycemia: Secondary | ICD-10-CM | POA: Diagnosis not present

## 2022-12-26 DIAGNOSIS — E1142 Type 2 diabetes mellitus with diabetic polyneuropathy: Secondary | ICD-10-CM

## 2022-12-26 DIAGNOSIS — E039 Hypothyroidism, unspecified: Secondary | ICD-10-CM

## 2022-12-26 DIAGNOSIS — I152 Hypertension secondary to endocrine disorders: Secondary | ICD-10-CM | POA: Diagnosis not present

## 2022-12-26 DIAGNOSIS — E785 Hyperlipidemia, unspecified: Secondary | ICD-10-CM

## 2022-12-26 DIAGNOSIS — I5022 Chronic systolic (congestive) heart failure: Secondary | ICD-10-CM | POA: Diagnosis not present

## 2022-12-26 DIAGNOSIS — E1169 Type 2 diabetes mellitus with other specified complication: Secondary | ICD-10-CM | POA: Diagnosis not present

## 2022-12-26 DIAGNOSIS — I739 Peripheral vascular disease, unspecified: Secondary | ICD-10-CM

## 2022-12-26 DIAGNOSIS — E1159 Type 2 diabetes mellitus with other circulatory complications: Secondary | ICD-10-CM

## 2022-12-26 NOTE — Progress Notes (Unsigned)
Terri Clamp, DNP, AGNP-c [facility]  [address] [place], [place] [address] [phone]  ESTABLISHED PATIENT- Chronic Health and/or Follow-Up Visit  Blood pressure 120/72, pulse 96, weight 295 lb 3.2 oz (133.9 kg).    Terri Heath is a 67 y.o. year old female presenting today for evaluation and management of chronic conditions.   Terri Heath reports attending physical therapy (PT) for 20 visits, starting last week, with plans to incorporate pool sessions to strengthen her walking ability, which has been compromised due to weakness. She mentions difficulty with squatting down and needing support to stand up, as well as challenges with carrying heavy items.   Terri Heath recently underwent hysterectomy for cancer, followed by five weeks of radiation and two rounds of chemotherapy, which have contributed to her current weakness. She feels that she is on the mend from this.   Terri Heath discusses her struggle with intense hunger after discontinuing Trulicity, a medication she was on for five years, noting a significant increase in appetite and weight. She expresses frustration over the high cost of GLP-1 medications like Trulicity and explores patient assistance programs for more affordable options.   Additionally, Terri Heath is preparing for cataract surgery and is involved in a cancer recovery program. She is actively managing her health, including addressing her diabetes and exploring options to reduce medication costs. Terri Heath also mentions the emotional toll of her health journey, including moments of frustration and the need to adapt daily activities to accommodate her physical limitations.  Cancer recovery at [facility] [employer]  All ROS negative with exception of what is listed above.   PHYSICAL EXAM Physical Exam Vitals and nursing note reviewed.  Constitutional:      General: She is not in acute distress.    Appearance: Normal appearance. She is obese.  HENT:     Head:  Normocephalic.  Eyes:     Extraocular Movements: Extraocular movements intact.     Conjunctiva/sclera: Conjunctivae normal.     Pupils: Pupils are equal, round, and reactive to light.  Neck:     Vascular: No carotid bruit.  Cardiovascular:     Rate and Rhythm: Normal rate and regular rhythm.     Pulses: Normal pulses.     Heart sounds: Normal heart sounds. No murmur heard. Pulmonary:     Effort: Pulmonary effort is normal.     Breath sounds: Normal breath sounds. No wheezing.  Abdominal:     General: Bowel sounds are normal. There is no distension.     Palpations: Abdomen is soft.     Tenderness: There is no abdominal tenderness. There is no right CVA tenderness, left CVA tenderness or guarding.  Musculoskeletal:        General: Normal range of motion.     Cervical back: Normal range of motion and neck supple.     Right lower leg: No edema.     Left lower leg: No edema.  Lymphadenopathy:     Cervical: No cervical adenopathy.  Skin:    General: Skin is warm and dry.     Capillary Refill: Capillary refill takes less than 2 seconds.  Neurological:     General: No focal deficit present.     Mental Status: She is alert and oriented to person, place, and time.     Sensory: No sensory deficit.     Motor: Weakness present.     Gait: Gait abnormal.  Psychiatric:        Mood and Affect: Mood normal.  Behavior: Behavior normal.        Thought Content: Thought content normal.        Judgment: Judgment normal.     PLAN Problem List Items Addressed This Visit     Poorly controlled type 2 diabetes mellitus with circulatory disorder (HCC) - Primary    History of chronic diabetes with elevated blood sugar readings.  Unfortunately she has been off Trulicity for about 4 weeks and has had an increase in her appetite and weight gain.  Insurance is no longer covering Trulicity which was very effective for her which is quite upsetting given the success that she had over the last 5 years.   We discussed the option of alternative GLP-1 receptor agonist such as Rybelsus, Ozempic, or Mounjaro.  At this time I feel Greggory Keen may be the most appropriate option for her given the high efficacy however current supply and insurance coverage may be limiting factors.  Alternatively Ozempic would be a very good option as well.  We will check to see which medication is best covered under insurance. Plan:  - Will send in prescription for Ozempic to see if we can get this covered. Given the difficulty in [facility] supply, I do not want to start on this right now.  - Look into the patient assistance program with [employer] online, some patients are able to get free medication if they qualify. I will be happy to send this off for you once your part is completed.       Relevant Medications   Semaglutide,0.25 or 0.5MG /DOS, (OZEMPIC, 0.25 OR 0.5 MG/DOSE,) 2 MG/3ML SOPN   Hyperlipidemia associated with type 2 diabetes mellitus (HCC)    Chronic HLD in the setting of DM, HTN. CHF. She is currently on repatha after not having success with rosuvastatin. No concerns are present. Following with cardiology.  Plan: - Continue current medications, diet, and exercise.      Relevant Medications   Semaglutide,0.25 or 0.5MG /DOS, (OZEMPIC, 0.25 OR 0.5 MG/DOSE,) 2 MG/3ML SOPN   Hypertension associated with type 2 diabetes mellitus (HCC)    Chronic HTN associated with DM, CHF, and HLD. BP is very well controlled at this time on coreg and entresto. No alarm symptoms are present.  Plan: - Continue your current medications. - Monitor BP at least once a week and let me know if you start to get readings consistently higher than 130/80      Relevant Medications   Semaglutide,0.25 or 0.5MG /DOS, (OZEMPIC, 0.25 OR 0.5 MG/DOSE,) 2 MG/3ML SOPN   Chronic systolic CHF (congestive heart failure), NYHA class 3 (HCC)    Chronic CHF appears controlled at this time with no signs of edema, crackles, or ShOB. CUrrently on aldactone,  lasix, entresto, and coreg. Followed closely with cardiology.  Plan: - Continue your current medications.  - Please let us know immediately if you have any changes      Relevant Medications   Semaglutide,0.25 or 0.5MG /DOS, (OZEMPIC, 0.25 OR 0.5 MG/DOSE,) 2 MG/3ML SOPN   Claudication in peripheral vascular disease (HCC)    Chronic and stable. Monitor       Diabetic peripheral neuropathy associated with type 2 diabetes mellitus (HCC)    Peripheral neuropathy present in the setting of DM and previous chemotherapy treatment, although symptoms were present prior to chemotherapy. No worsening of symptoms noted. Foot exam normal.  Plan: - Take good care of your feet by wearing shoes that fit well and do not rub at all times.  - Check your feet  every day for blisters, redness, or signs of infection - Wash and dry the feet very well every day and apply lotion to the feet, but avoid in between the toes as this can cause rubbing.       Relevant Medications   Semaglutide,0.25 or 0.5MG /DOS, (OZEMPIC, 0.25 OR 0.5 MG/DOSE,) 2 MG/3ML SOPN   Pancytopenia, acquired (HCC)    Recent labs show resolution. Will monitor.       Acquired hypothyroidism    Chronic. Currently managed with levothyroxine. No symptoms. Labs UTD. Plan: - continue current management - continue to follow with endocrine       No follow-ups on file. 6 months f/u  Terri Clamp, DNP, AGNP-c 12/26/2022  2:11 PM

## 2022-12-29 ENCOUNTER — Encounter (HOSPITAL_BASED_OUTPATIENT_CLINIC_OR_DEPARTMENT_OTHER): Payer: Self-pay | Admitting: Physical Therapy

## 2022-12-29 ENCOUNTER — Ambulatory Visit (HOSPITAL_BASED_OUTPATIENT_CLINIC_OR_DEPARTMENT_OTHER): Payer: Medicare HMO | Attending: Family Medicine | Admitting: Physical Therapy

## 2022-12-29 DIAGNOSIS — M6281 Muscle weakness (generalized): Secondary | ICD-10-CM | POA: Insufficient documentation

## 2022-12-29 DIAGNOSIS — R262 Difficulty in walking, not elsewhere classified: Secondary | ICD-10-CM | POA: Diagnosis not present

## 2022-12-29 NOTE — Therapy (Signed)
OUTPATIENT PHYSICAL THERAPY LOWER EXTREMITY TREATMENT   Patient Name: Terri Heath MRN: [id number] DOB:1956/01/14, 67 y.o., female Today's Date: 12/29/2022  END OF SESSION:  PT End of Session - 12/29/22 1315     Visit Number 2    Number of Visits 19    Date for PT Re-Evaluation 03/20/23    Authorization Type [employer] MCR    PT Start Time 1502    PT Stop Time 1543    PT Time Calculation (min) 41 min    Activity Tolerance Patient tolerated treatment well    Behavior During Therapy WFL for tasks assessed/performed             Past Medical History:  Diagnosis Date   Abnormal vaginal bleeding in postmenopausal patient 10/11/2021   Acute systolic heart failure (HCC)    Allergy    Asthma    Bilateral shoulder pain    Borderline hypertension    Bronchitis    CAD (coronary artery disease)    Chronic systolic CHF (congestive heart failure) (HCC)    Chronic systolic congestive heart failure, NYHA class 3 (HCC) 07/24/2018   Diabetes mellitus    Diagnosed in 2000, on insulin, Trulicity and metformin, not checking cgs at home   Drug-induced hypotension 12/20/2021   Gangrenous appendicitis with perforation s/p lap appendectomy 10/04/2017 10/04/2017   GERD (gastroesophageal reflux disease)    History of MI (myocardial infarction)    History of radiation therapy    Uterus- 12/14/21-02/01/22- Dr. Antony Blackbird   Hyperlipidemia    Hypertension    Hypothyroidism    MGUS (monoclonal gammopathy of unknown significance)    Mitral regurgitation    Myocardial infarction (HCC)    Neuropathy    Obesity    Sleep apnea    uses CPAP   Statin intolerance    Uterine cancer [facility])    Past Surgical History:  Procedure Laterality Date   BREATH TEK H PYLORI  09/18/2011   Procedure: BREATH TEK H PYLORI;  Surgeon: Kandis Cocking, MD;  Location: [facility];  Service: General;  Laterality: N/A;   CESAREAN SECTION     x 3   IR IMAGING GUIDED PORT INSERTION  12/07/2021   IR REMOVAL TUN ACCESS  W/ PORT W/O FL MOD SED  04/04/2022   LAPAROSCOPIC APPENDECTOMY N/A 10/03/2017   Procedure: APPENDECTOMY LAPAROSCOPIC;  Surgeon: Romie Levee, MD;  Location: [facility];  Service: General;  Laterality: N/A;   OPERATIVE ULTRASOUND N/A 02/01/2022   Procedure: OPERATIVE ULTRASOUND;  Surgeon: Antony Blackbird, MD;  Location: [facility];  Service: Urology;  Laterality: N/A;   RIGHT/LEFT HEART CATH AND CORONARY ANGIOGRAPHY N/A 02/12/2018   Procedure: RIGHT/LEFT HEART CATH AND CORONARY ANGIOGRAPHY;  Surgeon: Corky Crafts, MD;  Location: [facility] CV LAB;  Service: Cardiovascular;  Laterality: N/A;   ROBOTIC ASSISTED TOTAL HYSTERECTOMY WITH BILATERAL SALPINGO OOPHERECTOMY Bilateral 03/21/2022   Procedure: XI ROBOTIC ASSISTED TOTAL HYSTERECTOMY WITH BILATERAL SALPINGO OOPHORECTOMY, LYSIS OF ADHESIONS, CYSTOSCOPY;  Surgeon: Carver Fila, MD;  Location: [facility];  Service: Gynecology;  Laterality: Bilateral;   TANDEM RING INSERTION N/A 02/01/2022   Procedure: TANDEM RING INSERTION;  Surgeon: Antony Blackbird, MD;  Location: [facility];  Service: Urology;  Laterality: N/A;   TUBAL LIGATION     Patient Active Problem List   Diagnosis Date Noted   Closed fracture of distal fibula 07/17/2022   Hypomagnesemia 01/17/2022   Pancytopenia, acquired (HCC) 01/03/2022   Dehydration 12/20/2021   Elevated serum creatinine 12/20/2021   Diabetic  peripheral neuropathy associated with type 2 diabetes mellitus (HCC) 12/02/2021   History of endometrial cancer 11/07/2021   Encounter to establish care 01/06/2021   Vitamin D deficiency 01/06/2021   Rash of back 01/06/2021   Claudication in peripheral vascular disease (HCC) 09/16/2020   Chronic systolic CHF (congestive heart failure), NYHA class 3 (HCC) 07/24/2018   Acquired hypothyroidism 07/24/2018   Cardiomyopathy, ischemic 07/24/2018   MGUS (monoclonal gammopathy of unknown significance) 03/06/2018   Dyspnea 01/14/2018   Allergic rhinitis 06/11/2014   Asthma due to seasonal  allergies 10/24/2012   OSA (obstructive sleep apnea) 05/23/2011   Poorly controlled type 2 diabetes mellitus with circulatory disorder (HCC) 03/26/2007   Hyperlipidemia associated with type 2 diabetes mellitus (HCC) 03/26/2007   Obesity, Class III, BMI 40-49.9 (morbid obesity) (HCC) 03/26/2007   Hypertension associated with type 2 diabetes mellitus (HCC) 03/26/2007    PCP: Tollie Eth, NP   REFERRING PROVIDER: de [place], Raymond J, MD   REFERRING DIAG:  S82.831D (ICD-10-CM) - Closed fracture of distal end of right fibula with routine healing, unspecified fracture morphology, subsequent encounter      THERAPY DIAG:  Muscle weakness (generalized)  Difficulty walking  Rationale for Evaluation and Treatment: Rehabilitation  ONSET DATE: 07/10/2022 Initial Fall  SUBJECTIVE:   SUBJECTIVE STATEMENT: Pt reports no new changes since eval.    From eval:  Pt states her real problem stems from having her uterine cancer removed in 2023. Her fall that caused her ankle fx was due to the weakness into her LE vs ankle instability or faintness. She is here for weakness and deconditioning. Her ankle is not her major concern. She has difficulty with transfers and daily mobility. She has trouble with lifting type task and ADL due to the weakness. Doing laundry and unloading the dishwasher are difficult due to the low height. Pt has to have groceries delivered bc she is unable to walk the store but has trouble with getting them inside the house. Only able to walk for ~15 is minutes before stopping. Pt is most limited with ambulation and has to go down stairs backwards. Previously did aquatic exercise prior to cancer surgery. Comfortable with the water setting.   PERTINENT HISTORY: CHF, DM neuropathy (sock distribution), CAD, previous uterine cancer PAIN:  Are you having pain? No : NPRS scale: 0/10   PRECAUTIONS: None  WEIGHT BEARING RESTRICTIONS: No  FALLS:  Has patient fallen in last 6  months? Yes. Number of falls 2, caught foot on an ottoman   LIVING ENVIRONMENT: Lives with: lives with their family and lives with their spouse Lives in: House/apartment Stairs: Yes, modified rise  Has following equipment at home: Single point cane, rails in bathroom   OCCUPATION: previously [employer], EKG   PLOF: Independent with basic ADLs  PATIENT GOALS: return to normal ADL, walk without a cane, "feel stronger":    OBJECTIVE:   DIAGNOSTIC FINDINGS:   FINDINGS: Similar-appearing mildly displaced distal fibular fracture. No new periosteal reaction identified. There is no evidence of new acute fracture, dislocation, or joint effusion. There is no evidence of arthropathy or other focal bone abnormality. Soft tissues are unremarkable.   IMPRESSION: Similar-appearing mildly displaced distal fibular fracture. No new periosteal reaction identified.    PATIENT SURVEYS:  Lower Extremity Functional Score: 16 / 80 = 20.0 %  COGNITION: Overall cognitive status: Within functional limits for tasks assessed     SENSATION: Light touch: Impaired   POSTURE: rounded shoulders, increased thoracic kyphosis, anterior pelvic tilt,  and flexed trunk    LOWER EXTREMITY ROM:  AROM of bilat hips limited by proximal LE weakness and body habitus; L LE AROM at the hip most limited with flexion and ER position  LOWER EXTREMITY MMT:  MMT Right eval Left eval  Hip flexion 4/5 4/5  Hip extension 4/5 4/5  Hip abduction 4/5 4/5  Hip adduction    Hip internal rotation    Hip external rotation 4/5 4/5  Knee flexion 4/5 4/5  Knee extension 4/5 4/5   (Blank rows = not tested)    FUNCTIONAL TESTS:  5 times sit to stand: 28.7s  TUG (modified): measured 6ft from treatment room door 16.5s  Unable to perform tandem or SLS  GAIT: Distance walked: 56ft Assistive device utilized: Single point cane Level of assistance: Modified independence Comments: bilat compensated [place],  decreased L step length, APT   TODAY'S TREATMENT:                                                                                                                              DATE: 12/29/22  Pt seen for aquatic therapy today.  Treatment took place in water 3.5-4.75 ft in depth at the [facility] pool. Temp of water was 91.  Pt entered/exited the pool via stairs independently with bilat rail. * unsupported: walking forward/ backwards and side stepping- multiple laps; marching forward / backward; forward walking kicks (difficult) * holding wall:  heel raisesx 10 * holding rainbow hand floats: SLS x 15 sec each; SLS with 3 way toe tap (good challenge) 2 x 5  * at bench in water, with feet on blue step: STS with forward arm reach x 5 * forward step ups onto blue step in water by bench, UE on wall:  x 5 RLE,  x 5 LLE  Pt requires the buoyancy and hydrostatic pressure of water for support, and to offload joints by unweighting joint load by at least 50 % in navel deep water and by at least 75-80% in chest to neck deep water.  Viscosity of the water is needed for resistance of strengthening. Water current perturbations provides challenge to standing balance requiring increased core activation.     PATIENT EDUCATION:  Education details: aquatic therapy intro  Person educated: Patient Education method: Explanation, Demonstration, Tactile cues, Verbal cues,  Education comprehension: verbalized understanding, returned demonstration, verbal cues required, and tactile cues required  HOME EXERCISE PROGRAM:  Access Code: [email or web address] URL: [email or web address] Date: 12/20/2022 Prepared by: Zebedee Iba  ASSESSMENT:  CLINICAL IMPRESSION: Pt is confident in aquatic setting and able to take direction from therapist on deck.  She does not require UE support on floatation device for gait, but does need it for balance exercise as she is unsteady in SLS. Limited reps and exercises this  visit as she has not done much exercise/activity in a year per her report.  She requires cues to turn Rt foot forward (vs ER).  Goals are  ongoing. No increase in pain during session.    From initial eval:  Patient is a 67 y.o. female who was seen today for physical therapy evaluation and treatment for c/c of generalized weakness and deconditioning. Pt's s/s appear consistent with lumbopelvic weakness and deconditioning follow major pelvic surgery. Pt's is not pain limited with exercise/movement but rather fatigue and weakness. Pt's is strength limited this time. Plan to continue with aquatic and land based at future sessions for progressive mobility. Pt would benefit from continued skilled therapy in order to reach goals and maximize functional generalized strength and ROM for full return to PLOF.    OBJECTIVE IMPAIRMENTS: decreased activity tolerance, decreased balance, decreased endurance, decreased mobility, decreased ROM, decreased strength, hypomobility, increased muscle spasms, impaired flexibility, improper body mechanics, postural dysfunction, obesity, and pain.    ACTIVITY LIMITATIONS: lifting, squatting, locomotion level, and dressing   PARTICIPATION LIMITATIONS: interpersonal relationship, community activity, occupation, and exercise   PERSONAL FACTORS: Age, Fitness, Lifestyle, Past/current experiences, and Time since onset of injury/illness/exacerbation, and 2-3 comorbidities are also affecting patient's functional outcome.    REHAB POTENTIAL: Fair   CLINICAL DECISION MAKING: unstable/complicated   EVALUATION COMPLEXITY: Moderate     GOALS:     SHORT TERM GOALS: Target date: 01/31/2023     Pt will become independent with HEP in order to demonstrate synthesis of PT education.   Goal status: INITIAL   2. Pt will be able to demonstrate full hip/knee AROM in order to demonstrate functional improvement in LE function for self-care and house hold duties.      Goal status:  INITIAL   3.  Pt will report at least 2 pt reduction on NPRS scale for pain in order to demonstrate functional improvement with household activity, self care, and ADL.    Goal status: INITIAL   LONG TERM GOALS: Target date: 03/14/2023       Pt  will become independent with final HEP in order to demonstrate synthesis of PT education.   Goal status: INITIAL   2.  Pt will have an at least 18 pt improvement in LEFS measure in order to demonstrate MCID improvement in daily function.    Goal status: INITIAL   3.  Pt will be able to lift/squat/hold >10 lbs in order to demonstrate functional improvement in lumbopelvic strength for return to ADL and household activity.    Goal status: INITIAL   4.  Pt will be able to demonstrate reciprocal stair stepping with ascent and descent with single UE in order to demonstrate functional improvement in LE function for self-care and community ambulation.   Goal status: INITIAL   5. Pt will be able to demonstrate/report ability to walk >20 mins in order to demonstrate functional improvement and tolerance to exercise and community mobility.   Goal status: INITIAL     PLAN:   PT FREQUENCY: 1-2x/week   PT DURATION: 12 weeks (plan to D/C in 8-10 wks)   PLANNED INTERVENTIONS: Therapeutic exercises, Therapeutic activity, Neuromuscular re-education, Balance training, Gait training, Patient/Family education, Self Care, Joint mobilization, Joint manipulation, Stair training, Aquatic Therapy, Dry Needling, Electrical stimulation, Spinal manipulation, Spinal mobilization, Cryotherapy, Moist heat, scar mobilization, Splintting, Taping, Vasopneumatic device, Traction, Ultrasound, Ionotophoresis 4mg /ml Dexamethasone, Manual therapy, and Re-evaluation   PLAN FOR NEXT SESSION: when able, intro to aquatics: general conditioning, LE strength and exercise     Mayer Camel, PTA 12/29/22 3:41 PM [facility] [address] [place], [place], [address] Phone: [phone]  Fax:  [phone]     Referring diagnosis? M62.81, r26.2 Treatment diagnosis? (if different than referring diagnosis)  S82.831D (ICD-10-CM) - Closed fracture of distal end of right fibula with routine healing, unspecified fracture morphology, subsequent encounter   What was this (referring dx) caused by? []  Surgery [x]  Fall [x]  Ongoing issue []  Arthritis []  Other: ____________  Laterality: []  Rt []  Lt [x]  Both  Check all possible CPT codes:      []  97110 (Therapeutic Exercise)  []  92507 (SLP Treatment)  []  97112 (Neuro Re-ed)   []  92526 (Swallowing Treatment)   []  97116 (Gait Training)   []  [id number] (Cognitive Training, 1st 15 minutes) []  97140 (Manual Therapy)   []  97130 (Cognitive Training, each add'l 15 minutes)  []  97530 (Therapeutic Activities)  []  Other, List CPT Code ____________    []  97535 (Self Care)       [x]  All codes above (97110 - 97535)  [x]  97012 (Mechanical Traction)  [x]  97014 (E-stim Unattended)  [x]  82956 (E-stim manual)  [x]  97033 (Ionto)  [x]  97035 (Ultrasound)  []  97760 (Orthotic Fit) []  [id number] (Physical Performance Training) [x]  [id number] (Aquatic Therapy) []  97034 (Contrast Bath) []  97018 (Paraffin) []  97597 (Wound Care 1st 20 sq cm) []  97598 (Wound Care each add'l 20 sq cm) []  97016 (Vasopneumatic Device) []  [id number] Public affairs consultant) []  [id number] (Prosthetic Training)

## 2023-01-03 ENCOUNTER — Telehealth: Payer: Self-pay | Admitting: Nurse Practitioner

## 2023-01-03 NOTE — Telephone Encounter (Signed)
Jury duty letter mailed to court

## 2023-01-04 ENCOUNTER — Other Ambulatory Visit (HOSPITAL_COMMUNITY): Payer: Self-pay

## 2023-01-04 ENCOUNTER — Encounter: Payer: Self-pay | Admitting: Nurse Practitioner

## 2023-01-04 MED ORDER — OZEMPIC (0.25 OR 0.5 MG/DOSE) 2 MG/3ML ~~LOC~~ SOPN
0.2500 mg | PEN_INJECTOR | SUBCUTANEOUS | 0 refills | Status: DC
  Filled 2023-01-04: qty 6, 70d supply, fill #0
  Filled 2023-04-25: qty 3, 28d supply, fill #0

## 2023-01-04 NOTE — Assessment & Plan Note (Signed)
Chronic. Currently managed with levothyroxine. No symptoms. Labs UTD. Plan: - continue current management - continue to follow with endocrine

## 2023-01-04 NOTE — Assessment & Plan Note (Signed)
Chronic CHF appears controlled at this time with no signs of edema, crackles, or ShOB. CUrrently on aldactone, lasix, entresto, and coreg. Followed closely with cardiology.  Plan: - Continue your current medications.  - Please let us know immediately if you have any changes

## 2023-01-04 NOTE — Assessment & Plan Note (Signed)
Chronic HTN associated with DM, CHF, and HLD. BP is very well controlled at this time on coreg and entresto. No alarm symptoms are present.  Plan: - Continue your current medications. - Monitor BP at least once a week and let me know if you start to get readings consistently higher than 130/80

## 2023-01-04 NOTE — Assessment & Plan Note (Signed)
Peripheral neuropathy present in the setting of DM and previous chemotherapy treatment, although symptoms were present prior to chemotherapy. No worsening of symptoms noted. Foot exam normal.  Plan: - Take good care of your feet by wearing shoes that fit well and do not rub at all times.  - Check your feet every day for blisters, redness, or signs of infection - Wash and dry the feet very well every day and apply lotion to the feet, but avoid in between the toes as this can cause rubbing.

## 2023-01-04 NOTE — Assessment & Plan Note (Signed)
Chronic and stable. Monitor

## 2023-01-04 NOTE — Assessment & Plan Note (Signed)
Recent labs show resolution. Will monitor.

## 2023-01-04 NOTE — Assessment & Plan Note (Signed)
History of chronic diabetes with elevated blood sugar readings.  Unfortunately she has been off Trulicity for about 4 weeks and has had an increase in her appetite and weight gain.  Insurance is no longer covering Trulicity which was very effective for her which is quite upsetting given the success that she had over the last 5 years.  We discussed the option of alternative GLP-1 receptor agonist such as Rybelsus, Ozempic, or Mounjaro.  At this time I feel Greggory Keen may be the most appropriate option for her given the high efficacy however current supply and insurance coverage may be limiting factors.  Alternatively Ozempic would be a very good option as well.  We will check to see which medication is best covered under insurance. Plan:  - Will send in prescription for Ozempic to see if we can get this covered. Given the difficulty in [place] supply, I do not want to start on this right now.  - Look into the patient assistance program with NovoNordis online, some patients are able to get free medication if they qualify. I will be happy to send this off for you once your part is completed.

## 2023-01-04 NOTE — Assessment & Plan Note (Signed)
Chronic HLD in the setting of DM, HTN. CHF. She is currently on repatha after not having success with rosuvastatin. No concerns are present. Following with cardiology.  Plan: - Continue current medications, diet, and exercise.

## 2023-01-12 ENCOUNTER — Encounter (HOSPITAL_BASED_OUTPATIENT_CLINIC_OR_DEPARTMENT_OTHER): Payer: Self-pay

## 2023-01-12 ENCOUNTER — Ambulatory Visit (HOSPITAL_BASED_OUTPATIENT_CLINIC_OR_DEPARTMENT_OTHER): Payer: Medicare HMO | Admitting: Physical Therapy

## 2023-01-16 ENCOUNTER — Other Ambulatory Visit (HOSPITAL_COMMUNITY): Payer: Self-pay

## 2023-01-17 ENCOUNTER — Encounter (HOSPITAL_BASED_OUTPATIENT_CLINIC_OR_DEPARTMENT_OTHER): Payer: Self-pay | Admitting: Physical Therapy

## 2023-01-17 ENCOUNTER — Ambulatory Visit (HOSPITAL_BASED_OUTPATIENT_CLINIC_OR_DEPARTMENT_OTHER): Payer: Medicare HMO | Admitting: Physical Therapy

## 2023-01-17 DIAGNOSIS — M6281 Muscle weakness (generalized): Secondary | ICD-10-CM

## 2023-01-17 DIAGNOSIS — R262 Difficulty in walking, not elsewhere classified: Secondary | ICD-10-CM

## 2023-01-17 NOTE — Therapy (Signed)
OUTPATIENT PHYSICAL THERAPY LOWER EXTREMITY TREATMENT   Patient Name: Terri Heath MRN: [id number] DOB:02-Jun-1956, 67 y.o., female Today's Date: 01/17/2023  END OF SESSION:  PT End of Session - 01/17/23 1204     Visit Number 3    Number of Visits 19    Date for PT Re-Evaluation 03/20/23    Authorization Type [employer] MCR    PT Start Time 1154    PT Stop Time 1235    PT Time Calculation (min) 41 min    Activity Tolerance Patient tolerated treatment well    Behavior During Therapy WFL for tasks assessed/performed             Past Medical History:  Diagnosis Date   Abnormal vaginal bleeding in postmenopausal patient 10/11/2021   Acute systolic heart failure (HCC)    Allergy    Asthma    Bilateral shoulder pain    Borderline hypertension    Bronchitis    CAD (coronary artery disease)    Chronic systolic CHF (congestive heart failure) (HCC)    Chronic systolic congestive heart failure, NYHA class 3 (HCC) 07/24/2018   Diabetes mellitus    Diagnosed in 2000, on insulin, Trulicity and metformin, not checking cgs at home   Drug-induced hypotension 12/20/2021   Gangrenous appendicitis with perforation s/p lap appendectomy 10/04/2017 10/04/2017   GERD (gastroesophageal reflux disease)    History of MI (myocardial infarction)    History of radiation therapy    Uterus- 12/14/21-02/01/22- Dr. Antony Blackbird   Hyperlipidemia    Hypertension    Hypothyroidism    MGUS (monoclonal gammopathy of unknown significance)    Mitral regurgitation    Myocardial infarction (HCC)    Neuropathy    Obesity    Sleep apnea    uses CPAP   Statin intolerance    Uterine cancer [facility])    Past Surgical History:  Procedure Laterality Date   BREATH TEK H PYLORI  09/18/2011   Procedure: BREATH TEK H PYLORI;  Surgeon: Kandis Cocking, MD;  Location: [facility];  Service: General;  Laterality: N/A;   CESAREAN SECTION     x 3   IR IMAGING GUIDED PORT INSERTION  12/07/2021   IR REMOVAL TUN ACCESS  W/ PORT W/O FL MOD SED  04/04/2022   LAPAROSCOPIC APPENDECTOMY N/A 10/03/2017   Procedure: APPENDECTOMY LAPAROSCOPIC;  Surgeon: Romie Levee, MD;  Location: [facility];  Service: General;  Laterality: N/A;   OPERATIVE ULTRASOUND N/A 02/01/2022   Procedure: OPERATIVE ULTRASOUND;  Surgeon: Antony Blackbird, MD;  Location: [facility];  Service: Urology;  Laterality: N/A;   RIGHT/LEFT HEART CATH AND CORONARY ANGIOGRAPHY N/A 02/12/2018   Procedure: RIGHT/LEFT HEART CATH AND CORONARY ANGIOGRAPHY;  Surgeon: Corky Crafts, MD;  Location: [facility] INVASIVE CV LAB;  Service: Cardiovascular;  Laterality: N/A;   ROBOTIC ASSISTED TOTAL HYSTERECTOMY WITH BILATERAL SALPINGO OOPHERECTOMY Bilateral 03/21/2022   Procedure: XI ROBOTIC ASSISTED TOTAL HYSTERECTOMY WITH BILATERAL SALPINGO OOPHORECTOMY, LYSIS OF ADHESIONS, CYSTOSCOPY;  Surgeon: Carver Fila, MD;  Location: [facility];  Service: Gynecology;  Laterality: Bilateral;   TANDEM RING INSERTION N/A 02/01/2022   Procedure: TANDEM RING INSERTION;  Surgeon: Antony Blackbird, MD;  Location: [facility];  Service: Urology;  Laterality: N/A;   TUBAL LIGATION     Patient Active Problem List   Diagnosis Date Noted   Closed fracture of distal fibula 07/17/2022   Hypomagnesemia 01/17/2022   Pancytopenia, acquired (HCC) 01/03/2022   Dehydration 12/20/2021   Elevated serum creatinine 12/20/2021   Diabetic  peripheral neuropathy associated with type 2 diabetes mellitus (HCC) 12/02/2021   History of endometrial cancer 11/07/2021   Encounter to establish care 01/06/2021   Vitamin D deficiency 01/06/2021   Rash of back 01/06/2021   Claudication in peripheral vascular disease (HCC) 09/16/2020   Chronic systolic CHF (congestive heart failure), NYHA class 3 (HCC) 07/24/2018   Acquired hypothyroidism 07/24/2018   Cardiomyopathy, ischemic 07/24/2018   MGUS (monoclonal gammopathy of unknown significance) 03/06/2018   Dyspnea 01/14/2018   Allergic rhinitis 06/11/2014   Asthma due to seasonal  allergies 10/24/2012   OSA (obstructive sleep apnea) 05/23/2011   Poorly controlled type 2 diabetes mellitus with circulatory disorder (HCC) 03/26/2007   Hyperlipidemia associated with type 2 diabetes mellitus (HCC) 03/26/2007   Obesity, Class III, BMI 40-49.9 (morbid obesity) (HCC) 03/26/2007   Hypertension associated with type 2 diabetes mellitus (HCC) 03/26/2007    PCP: Tollie Eth, NP   REFERRING PROVIDER: de [place], Raymond J, MD   REFERRING DIAG:  S82.831D (ICD-10-CM) - Closed fracture of distal end of right fibula with routine healing, unspecified fracture morphology, subsequent encounter      THERAPY DIAG:  Muscle weakness (generalized)  Difficulty walking  Rationale for Evaluation and Treatment: Rehabilitation  ONSET DATE: 07/10/2022 Initial Fall  SUBJECTIVE:   SUBJECTIVE STATEMENT: Pt reports after last session she sat down and heard a loud pop side of left knee.  Was unable to bear weight, needed to go back to using walker x 2 weeks.  Returned to using cane last week and has now felt able to come to PT.  She did not seek medical attention.   From eval:  Pt states her real problem stems from having her uterine cancer removed in 2023. Her fall that caused her ankle fx was due to the weakness into her LE vs ankle instability or faintness. She is here for weakness and deconditioning. Her ankle is not her major concern. She has difficulty with transfers and daily mobility. She has trouble with lifting type task and ADL due to the weakness. Doing laundry and unloading the dishwasher are difficult due to the low height. Pt has to have groceries delivered bc she is unable to walk the store but has trouble with getting them inside the house. Only able to walk for ~15 is minutes before stopping. Pt is most limited with ambulation and has to go down stairs backwards. Previously did aquatic exercise prior to cancer surgery. Comfortable with the water setting.   PERTINENT  HISTORY: CHF, DM neuropathy (sock distribution), CAD, previous uterine cancer PAIN:  Are you having pain? No : NPRS scale: 0/10   PRECAUTIONS: None  WEIGHT BEARING RESTRICTIONS: No  FALLS:  Has patient fallen in last 6 months? Yes. Number of falls 2, caught foot on an ottoman   LIVING ENVIRONMENT: Lives with: lives with their family and lives with their spouse Lives in: House/apartment Stairs: Yes, modified rise  Has following equipment at home: Single point cane, rails in bathroom   OCCUPATION: previously [employer], EKG   PLOF: Independent with basic ADLs  PATIENT GOALS: return to normal ADL, walk without a cane, "feel stronger":    OBJECTIVE:   DIAGNOSTIC FINDINGS:   FINDINGS: Similar-appearing mildly displaced distal fibular fracture. No new periosteal reaction identified. There is no evidence of new acute fracture, dislocation, or joint effusion. There is no evidence of arthropathy or other focal bone abnormality. Soft tissues are unremarkable.   IMPRESSION: Similar-appearing mildly displaced distal fibular fracture. No new periosteal  reaction identified.    PATIENT SURVEYS:  Lower Extremity Functional Score: 16 / 80 = 20.0 %  COGNITION: Overall cognitive status: Within functional limits for tasks assessed     SENSATION: Light touch: Impaired   POSTURE: rounded shoulders, increased thoracic kyphosis, anterior pelvic tilt, and flexed trunk    LOWER EXTREMITY ROM:  AROM of bilat hips limited by proximal LE weakness and body habitus; L LE AROM at the hip most limited with flexion and ER position  LOWER EXTREMITY MMT:  MMT Right eval Left eval  Hip flexion 4/5 4/5  Hip extension 4/5 4/5  Hip abduction 4/5 4/5  Hip adduction    Hip internal rotation    Hip external rotation 4/5 4/5  Knee flexion 4/5 4/5  Knee extension 4/5 4/5   (Blank rows = not tested)    FUNCTIONAL TESTS:  5 times sit to stand: 28.7s  TUG (modified): measured 67ft from  treatment room door 16.5s  Unable to perform tandem or SLS  GAIT: Distance walked: 63ft Assistive device utilized: Single point cane Level of assistance: Modified independence Comments: bilat compensated [place], decreased L step length, APT   TODAY'S TREATMENT:                                                                                                                              DATE: 01/17/23  Pt seen for aquatic therapy today.  Treatment took place in water 3.5-4.75 ft in depth at the [facility] pool. Temp of water was 91.  Pt entered/exited the pool via stairs independently with bilat rail. * unsupported: walking forward/ backwards and side stepping- multiple laps; marching forward / backward;  * holding wall:  heel raises; toe raises; hip add/abd; hip extension x 10-15 *Seated on lift: cycling; hip add/abd; LAQ; hip add/abd 2 x 10-15.  Cues for increasing speed 2nd set for increased resistance *STS from bench onto pool floor  with forward arm reach 2x 10 *Core engagement/TrA sets solid noodle pull downs wide stance x 10; staggered stance x 5-7.  Pt requires extra time to gain motor plan for maintaining balance with movement. Added cues for abdominal bracing *Hamstring stretching  Pt requires the buoyancy and hydrostatic pressure of water for support, and to offload joints by unweighting joint load by at least 50 % in navel deep water and by at least 75-80% in chest to neck deep water.  Viscosity of the water is needed for resistance of strengthening. Water current perturbations provides challenge to standing balance requiring increased core activation.     PATIENT EDUCATION:  Education details: aquatic therapy intro  Person educated: Patient Education method: Explanation, Demonstration, Tactile cues, Verbal cues,  Education comprehension: verbalized understanding, returned demonstration, verbal cues required, and tactile cues required  HOME EXERCISE  PROGRAM:  Access Code: [email or web address] URL: [email or web address] Date: 12/20/2022 Prepared by: Zebedee Iba  ASSESSMENT:  CLINICAL IMPRESSION: Pt presents using new rollator.  She had a painful event/popping  left lat knee at tibial plateau/amt tib insertion area after 3 days after last session. She is returning today 3 weeks later reporting she has just been able to get back to using cane in past week. New complaints of progressed neuropathies in feet and ankles. She is encouraged to reach out to primary care MD. She VU.  She has added the rollator to help with longer distance amb.  Pain today is 2/10 in that area.  When it happened it was 8/10 for at least 1 week. Post session 0/10 pain    From initial eval:  Patient is a 67 y.o. female who was seen today for physical therapy evaluation and treatment for c/c of generalized weakness and deconditioning. Pt's s/s appear consistent with lumbopelvic weakness and deconditioning follow major pelvic surgery. Pt's is not pain limited with exercise/movement but rather fatigue and weakness. Pt's is strength limited this time. Plan to continue with aquatic and land based at future sessions for progressive mobility. Pt would benefit from continued skilled therapy in order to reach goals and maximize functional generalized strength and ROM for full return to PLOF.    OBJECTIVE IMPAIRMENTS: decreased activity tolerance, decreased balance, decreased endurance, decreased mobility, decreased ROM, decreased strength, hypomobility, increased muscle spasms, impaired flexibility, improper body mechanics, postural dysfunction, obesity, and pain.    ACTIVITY LIMITATIONS: lifting, squatting, locomotion level, and dressing   PARTICIPATION LIMITATIONS: interpersonal relationship, community activity, occupation, and exercise   PERSONAL FACTORS: Age, Fitness, Lifestyle, Past/current experiences, and Time since onset of injury/illness/exacerbation, and 2-3  comorbidities are also affecting patient's functional outcome.    REHAB POTENTIAL: Fair   CLINICAL DECISION MAKING: unstable/complicated   EVALUATION COMPLEXITY: Moderate     GOALS:     SHORT TERM GOALS: Target date: 01/31/2023     Pt will become independent with HEP in order to demonstrate synthesis of PT education.   Goal status: INITIAL   2. Pt will be able to demonstrate full hip/knee AROM in order to demonstrate functional improvement in LE function for self-care and house hold duties.      Goal status: INITIAL   3.  Pt will report at least 2 pt reduction on NPRS scale for pain in order to demonstrate functional improvement with household activity, self care, and ADL.    Goal status: INITIAL   LONG TERM GOALS: Target date: 03/14/2023       Pt  will become independent with final HEP in order to demonstrate synthesis of PT education.   Goal status: INITIAL   2.  Pt will have an at least 18 pt improvement in LEFS measure in order to demonstrate MCID improvement in daily function.    Goal status: INITIAL   3.  Pt will be able to lift/squat/hold >10 lbs in order to demonstrate functional improvement in lumbopelvic strength for return to ADL and household activity.    Goal status: INITIAL   4.  Pt will be able to demonstrate reciprocal stair stepping with ascent and descent with single UE in order to demonstrate functional improvement in LE function for self-care and community ambulation.   Goal status: INITIAL   5. Pt will be able to demonstrate/report ability to walk >20 mins in order to demonstrate functional improvement and tolerance to exercise and community mobility.   Goal status: INITIAL     PLAN:   PT FREQUENCY: 1-2x/week   PT DURATION: 12 weeks (plan to D/C in 8-10 wks)   PLANNED INTERVENTIONS: Therapeutic exercises, Therapeutic activity, Neuromuscular  re-education, Balance training, Gait training, Patient/Family education, Self Care, Joint  mobilization, Joint manipulation, Stair training, Aquatic Therapy, Dry Needling, Electrical stimulation, Spinal manipulation, Spinal mobilization, Cryotherapy, Moist heat, scar mobilization, Splintting, Taping, Vasopneumatic device, Traction, Ultrasound, Ionotophoresis 4mg /ml Dexamethasone, Manual therapy, and Re-evaluation   PLAN FOR NEXT SESSION: when able, intro to aquatics: general conditioning, LE strength and exercise     Corrie Dandy Tomma Lightning) Eamonn Sermeno MPT 01/17/23 12:08 PM [facility] [address] [place], [place], [address] Phone: [phone]   Fax:  [phone]     Referring diagnosis? M62.81, r26.2 Treatment diagnosis? (if different than referring diagnosis)  S82.831D (ICD-10-CM) - Closed fracture of distal end of right fibula with routine healing, unspecified fracture morphology, subsequent encounter   What was this (referring dx) caused by? []  Surgery [x]  Fall [x]  Ongoing issue []  Arthritis []  Other: ____________  Laterality: []  Rt []  Lt [x]  Both  Check all possible CPT codes:      []  97110 (Therapeutic Exercise)  []  92507 (SLP Treatment)  []  97112 (Neuro Re-ed)   []  92526 (Swallowing Treatment)   []  97116 (Gait Training)   []  [id number] (Cognitive Training, 1st 15 minutes) []  97140 (Manual Therapy)   []  97130 (Cognitive Training, each add'l 15 minutes)  []  97530 (Therapeutic Activities)  []  Other, List CPT Code ____________    []  97535 (Self Care)       [x]  All codes above (97110 - 97535)  [x]  97012 (Mechanical Traction)  [x]  97014 (E-stim Unattended)  [x]  97032 (E-stim manual)  [x]  97033 (Ionto)  [x]  97035 (Ultrasound)  []  97760 (Orthotic Fit) []  97750 (Physical Performance Training) [x]  [id number] (Aquatic Therapy) []  [id number] (Contrast Bath) []  [id number] (Paraffin) []  65784 (Wound Care 1st 20 sq cm) []  97598 (Wound Care each add'l 20 sq cm) []  97016 (Vasopneumatic Device) []  [id number] Public affairs consultant) []  [id number]  (Prosthetic Training)

## 2023-01-19 ENCOUNTER — Other Ambulatory Visit (HOSPITAL_COMMUNITY): Payer: Self-pay

## 2023-01-19 ENCOUNTER — Ambulatory Visit (HOSPITAL_BASED_OUTPATIENT_CLINIC_OR_DEPARTMENT_OTHER): Payer: Medicare HMO | Admitting: Physical Therapy

## 2023-01-19 ENCOUNTER — Other Ambulatory Visit: Payer: Self-pay

## 2023-01-19 ENCOUNTER — Encounter (HOSPITAL_BASED_OUTPATIENT_CLINIC_OR_DEPARTMENT_OTHER): Payer: Self-pay | Admitting: Physical Therapy

## 2023-01-19 DIAGNOSIS — M6281 Muscle weakness (generalized): Secondary | ICD-10-CM | POA: Diagnosis not present

## 2023-01-19 DIAGNOSIS — R262 Difficulty in walking, not elsewhere classified: Secondary | ICD-10-CM

## 2023-01-19 NOTE — Therapy (Signed)
OUTPATIENT PHYSICAL THERAPY LOWER EXTREMITY TREATMENT   Patient Name: Terri Heath MRN: [id number] DOB:Jan 24, 1956, 67 y.o., female Today's Date: 01/19/2023  END OF SESSION:  PT End of Session - 01/19/23 1352     Visit Number 4    Number of Visits 19    Date for PT Re-Evaluation 03/20/23    Authorization Type [employer] MCR    PT Start Time 1345    PT Stop Time 1425    PT Time Calculation (min) 40 min    Activity Tolerance Patient tolerated treatment well    Behavior During Therapy WFL for tasks assessed/performed             Past Medical History:  Diagnosis Date   Abnormal vaginal bleeding in postmenopausal patient 10/11/2021   Acute systolic heart failure (HCC)    Allergy    Asthma    Bilateral shoulder pain    Borderline hypertension    Bronchitis    CAD (coronary artery disease)    Chronic systolic CHF (congestive heart failure) (HCC)    Chronic systolic congestive heart failure, NYHA class 3 (HCC) 07/24/2018   Diabetes mellitus    Diagnosed in 2000, on insulin, Trulicity and metformin, not checking cgs at home   Drug-induced hypotension 12/20/2021   Gangrenous appendicitis with perforation s/p lap appendectomy 10/04/2017 10/04/2017   GERD (gastroesophageal reflux disease)    History of MI (myocardial infarction)    History of radiation therapy    Uterus- 12/14/21-02/01/22- Dr. Antony Blackbird   Hyperlipidemia    Hypertension    Hypothyroidism    MGUS (monoclonal gammopathy of unknown significance)    Mitral regurgitation    Myocardial infarction (HCC)    Neuropathy    Obesity    Sleep apnea    uses CPAP   Statin intolerance    Uterine cancer [facility])    Past Surgical History:  Procedure Laterality Date   BREATH TEK H PYLORI  09/18/2011   Procedure: BREATH TEK H PYLORI;  Surgeon: Kandis Cocking, MD;  Location: [facility];  Service: General;  Laterality: N/A;   CESAREAN SECTION     x 3   IR IMAGING GUIDED PORT INSERTION  12/07/2021   IR REMOVAL TUN ACCESS  W/ PORT W/O FL MOD SED  04/04/2022   LAPAROSCOPIC APPENDECTOMY N/A 10/03/2017   Procedure: APPENDECTOMY LAPAROSCOPIC;  Surgeon: Romie Levee, MD;  Location: [facility];  Service: General;  Laterality: N/A;   OPERATIVE ULTRASOUND N/A 02/01/2022   Procedure: OPERATIVE ULTRASOUND;  Surgeon: Antony Blackbird, MD;  Location: [facility];  Service: Urology;  Laterality: N/A;   RIGHT/LEFT HEART CATH AND CORONARY ANGIOGRAPHY N/A 02/12/2018   Procedure: RIGHT/LEFT HEART CATH AND CORONARY ANGIOGRAPHY;  Surgeon: Corky Crafts, MD;  Location: [facility] INVASIVE CV LAB;  Service: Cardiovascular;  Laterality: N/A;   ROBOTIC ASSISTED TOTAL HYSTERECTOMY WITH BILATERAL SALPINGO OOPHERECTOMY Bilateral 03/21/2022   Procedure: XI ROBOTIC ASSISTED TOTAL HYSTERECTOMY WITH BILATERAL SALPINGO OOPHORECTOMY, LYSIS OF ADHESIONS, CYSTOSCOPY;  Surgeon: Carver Fila, MD;  Location: [facility];  Service: Gynecology;  Laterality: Bilateral;   TANDEM RING INSERTION N/A 02/01/2022   Procedure: TANDEM RING INSERTION;  Surgeon: Antony Blackbird, MD;  Location: [facility];  Service: Urology;  Laterality: N/A;   TUBAL LIGATION     Patient Active Problem List   Diagnosis Date Noted   Closed fracture of distal fibula 07/17/2022   Hypomagnesemia 01/17/2022   Pancytopenia, acquired (HCC) 01/03/2022   Dehydration 12/20/2021   Elevated serum creatinine 12/20/2021   Diabetic  peripheral neuropathy associated with type 2 diabetes mellitus (HCC) 12/02/2021   History of endometrial cancer 11/07/2021   Encounter to establish care 01/06/2021   Vitamin D deficiency 01/06/2021   Rash of back 01/06/2021   Claudication in peripheral vascular disease (HCC) 09/16/2020   Chronic systolic CHF (congestive heart failure), NYHA class 3 (HCC) 07/24/2018   Acquired hypothyroidism 07/24/2018   Cardiomyopathy, ischemic 07/24/2018   MGUS (monoclonal gammopathy of unknown significance) 03/06/2018   Dyspnea 01/14/2018   Allergic rhinitis 06/11/2014   Asthma due to seasonal  allergies 10/24/2012   OSA (obstructive sleep apnea) 05/23/2011   Poorly controlled type 2 diabetes mellitus with circulatory disorder (HCC) 03/26/2007   Hyperlipidemia associated with type 2 diabetes mellitus (HCC) 03/26/2007   Obesity, Class III, BMI 40-49.9 (morbid obesity) (HCC) 03/26/2007   Hypertension associated with type 2 diabetes mellitus (HCC) 03/26/2007    PCP: Tollie Eth, NP   REFERRING PROVIDER: de [place], Raymond J, MD   REFERRING DIAG:  S82.831D (ICD-10-CM) - Closed fracture of distal end of right fibula with routine healing, unspecified fracture morphology, subsequent encounter      THERAPY DIAG:  Muscle weakness (generalized)  Difficulty walking  Rationale for Evaluation and Treatment: Rehabilitation  ONSET DATE: 07/10/2022 Initial Fall  SUBJECTIVE:   SUBJECTIVE STATEMENT: Pt reports she felt good after last aquatic session. She plans to join [place] [facility] for continuation of aquatic exercise after finishing PT.     From eval:  Pt states her real problem stems from having her uterine cancer removed in 2023. Her fall that caused her ankle fx was due to the weakness into her LE vs ankle instability or faintness. She is here for weakness and deconditioning. Her ankle is not her major concern. She has difficulty with transfers and daily mobility. She has trouble with lifting type task and ADL due to the weakness. Doing laundry and unloading the dishwasher are difficult due to the low height. Pt has to have groceries delivered bc she is unable to walk the store but has trouble with getting them inside the house. Only able to walk for ~15 is minutes before stopping. Pt is most limited with ambulation and has to go down stairs backwards. Previously did aquatic exercise prior to cancer surgery. Comfortable with the water setting.   PERTINENT HISTORY: CHF, DM neuropathy (sock distribution), CAD, previous uterine cancer PAIN:  Are you having pain? yes : NPRS scale:  1/10 Location:  Lt knee Description: sharp/ tight  (aches at night) Aggravating: night time Relieving:  ? Medication    PRECAUTIONS: None  WEIGHT BEARING RESTRICTIONS: No  FALLS:  Has patient fallen in last 6 months? Yes. Number of falls 2, caught foot on an ottoman   LIVING ENVIRONMENT: Lives with: lives with their family and lives with their spouse Lives in: House/apartment Stairs: Yes, modified rise  Has following equipment at home: Single point cane, rails in bathroom   OCCUPATION: previously [employer], EKG   PLOF: Independent with basic ADLs  PATIENT GOALS: return to normal ADL, walk without a cane, "feel stronger":    OBJECTIVE:   DIAGNOSTIC FINDINGS:   FINDINGS: Similar-appearing mildly displaced distal fibular fracture. No new periosteal reaction identified. There is no evidence of new acute fracture, dislocation, or joint effusion. There is no evidence of arthropathy or other focal bone abnormality. Soft tissues are unremarkable.   IMPRESSION: Similar-appearing mildly displaced distal fibular fracture. No new periosteal reaction identified.    PATIENT SURVEYS:  Lower Extremity Functional Score:  16 / 80 = 20.0 %  COGNITION: Overall cognitive status: Within functional limits for tasks assessed     SENSATION: Light touch: Impaired   POSTURE: rounded shoulders, increased thoracic kyphosis, anterior pelvic tilt, and flexed trunk    LOWER EXTREMITY ROM:  AROM of bilat hips limited by proximal LE weakness and body habitus; L LE AROM at the hip most limited with flexion and ER position  LOWER EXTREMITY MMT:  MMT Right eval Left eval  Hip flexion 4/5 4/5  Hip extension 4/5 4/5  Hip abduction 4/5 4/5  Hip adduction    Hip internal rotation    Hip external rotation 4/5 4/5  Knee flexion 4/5 4/5  Knee extension 4/5 4/5   (Blank rows = not tested)    FUNCTIONAL TESTS:  5 times sit to stand: 28.7s  TUG (modified): measured 71ft from treatment  room door 16.5s  Unable to perform tandem or SLS  GAIT: Distance walked: 68ft Assistive device utilized: Single point cane Level of assistance: Modified independence Comments: bilat compensated [place], decreased L step length, APT   TODAY'S TREATMENT:                                                                                                                              DATE: 01/19/23  Pt seen for aquatic therapy today.  Treatment took place in water 3.5-4.75 ft in depth at the [facility] pool. Temp of water was 91.  Pt entered/exited the pool via stairs independently with bilat rail. * unsupported: walking forward/ backwards and side stepping- multiple laps;  * switch to small step length with slight increase step height ("stepping over cord"-improved tolerance) * walking backward/forward with bilat rainbow hand floats at side under water for increased core engagement x 2 laps  * holding wall:  heel raises x 10, leg swings into hip flex/extension x 10;  hip add/abd x 10; alternating single leg clams * return to walking forward/ backward with reciprocal arm swing * TrA set with solid noodle pull down to thighs in staggered stance x 5 reps each LE forward *Seated on bench in water: cycling; hip add/abd; alternating LAQ with DF; hip add/abd x 10.  Cues for increasing speed 2nd set for increased resistance *STS from bench onto pool floor  with forward arm reach 2x 10 *Hamstring stretching  Pt requires the buoyancy and hydrostatic pressure of water for support, and to offload joints by unweighting joint load by at least 50 % in navel deep water and by at least 75-80% in chest to neck deep water.  Viscosity of the water is needed for resistance of strengthening. Water current perturbations provides challenge to standing balance requiring increased core activation.     PATIENT EDUCATION:  Education details: aquatic therapy exercise progressions/modifications  Person  educated: Patient Education method: Explanation, Demonstration, Tactile cues, Verbal cues,  Education comprehension: verbalized understanding, returned demonstration, verbal cues required, and tactile cues required  HOME EXERCISE  PROGRAM:  Access Code: [email or web address] URL: [email or web address] Date: 12/20/2022 Prepared by: Zebedee Iba  ASSESSMENT:  CLINICAL IMPRESSION: Pt arrives with less pain today.  She reported increased L knee irritation with wide side stepping; improved with decreased step length.  She reported 1/10 throughout session in Lt knee, until seated exercise in water when pain reduced to 0/10.   Pt would benefit from continued skilled therapy in order to reach goals and maximize functional generalized strength and ROM for full return to PLOF. Progressing towards goals.    OBJECTIVE IMPAIRMENTS: decreased activity tolerance, decreased balance, decreased endurance, decreased mobility, decreased ROM, decreased strength, hypomobility, increased muscle spasms, impaired flexibility, improper body mechanics, postural dysfunction, obesity, and pain.    ACTIVITY LIMITATIONS: lifting, squatting, locomotion level, and dressing   PARTICIPATION LIMITATIONS: interpersonal relationship, community activity, occupation, and exercise   PERSONAL FACTORS: Age, Fitness, Lifestyle, Past/current experiences, and Time since onset of injury/illness/exacerbation, and 2-3 comorbidities are also affecting patient's functional outcome.    REHAB POTENTIAL: Fair   CLINICAL DECISION MAKING: unstable/complicated   EVALUATION COMPLEXITY: Moderate     GOALS:     SHORT TERM GOALS: Target date: 01/31/2023     Pt will become independent with HEP in order to demonstrate synthesis of PT education.   Goal status: INITIAL   2. Pt will be able to demonstrate full hip/knee AROM in order to demonstrate functional improvement in LE function for self-care and house hold duties.      Goal status:  INITIAL   3.  Pt will report at least 2 pt reduction on NPRS scale for pain in order to demonstrate functional improvement with household activity, self care, and ADL.    Goal status: INITIAL   LONG TERM GOALS: Target date: 03/14/2023       Pt  will become independent with final HEP in order to demonstrate synthesis of PT education.   Goal status: INITIAL   2.  Pt will have an at least 18 pt improvement in LEFS measure in order to demonstrate MCID improvement in daily function.    Goal status: INITIAL   3.  Pt will be able to lift/squat/hold >10 lbs in order to demonstrate functional improvement in lumbopelvic strength for return to ADL and household activity.    Goal status: INITIAL   4.  Pt will be able to demonstrate reciprocal stair stepping with ascent and descent with single UE in order to demonstrate functional improvement in LE function for self-care and community ambulation.   Goal status: INITIAL   5. Pt will be able to demonstrate/report ability to walk >20 mins in order to demonstrate functional improvement and tolerance to exercise and community mobility.   Goal status: INITIAL     PLAN:   PT FREQUENCY: 1-2x/week   PT DURATION: 12 weeks (plan to D/C in 8-10 wks)   PLANNED INTERVENTIONS: Therapeutic exercises, Therapeutic activity, Neuromuscular re-education, Balance training, Gait training, Patient/Family education, Self Care, Joint mobilization, Joint manipulation, Stair training, Aquatic Therapy, Dry Needling, Electrical stimulation, Spinal manipulation, Spinal mobilization, Cryotherapy, Moist heat, scar mobilization, Splintting, Taping, Vasopneumatic device, Traction, Ultrasound, Ionotophoresis 4mg /ml Dexamethasone, Manual therapy, and Re-evaluation   PLAN FOR NEXT SESSION: when able, cont aquatics: general conditioning, LE strength and exercise    Mayer Camel, PTA 01/19/23 2:27 PM [facility] [address] [place], [place], [address] Phone: [phone]   Fax:  [phone]    Referring diagnosis? M62.81, r26.2 Treatment diagnosis? (if different than referring diagnosis)  Z61.096E (ICD-10-CM) - Closed fracture of distal end of right fibula with routine healing, unspecified fracture morphology, subsequent encounter   What was this (referring dx) caused by? []  Surgery [x]  Fall [x]  Ongoing issue []  Arthritis []  Other: ____________  Laterality: []  Rt []  Lt [x]  Both  Check all possible CPT codes:      []  97110 (Therapeutic Exercise)  []  92507 (SLP Treatment)  []  97112 (Neuro Re-ed)   []  92526 (Swallowing Treatment)   []  45409 (Gait Training)   []  [id number] (Cognitive Training, 1st 15 minutes) []  97140 (Manual Therapy)   []  97130 (Cognitive Training, each add'l 15 minutes)  []  97530 (Therapeutic Activities)  []  Other, List CPT Code ____________    []  97535 (Self Care)       [x]  All codes above (97110 - 97535)  [x]  97012 (Mechanical Traction)  [x]  97014 (E-stim Unattended)  [x]  97032 (E-stim manual)  [x]  97033 (Ionto)  [x]  97035 (Ultrasound)  []  97760 (Orthotic Fit) []  [id number] (Physical Performance Training) [x]  [id number] (Aquatic Therapy) []  97034 (Contrast Bath) []  [id number] (Paraffin) []  97597 (Wound Care 1st 20 sq cm) []  97598 (Wound Care each add'l 20 sq cm) []  97016 (Vasopneumatic Device) []  [id number] Public affairs consultant) []  [id number] (Prosthetic Training)

## 2023-01-23 ENCOUNTER — Other Ambulatory Visit: Payer: Self-pay | Admitting: Cardiology

## 2023-01-23 ENCOUNTER — Other Ambulatory Visit: Payer: Self-pay

## 2023-01-23 ENCOUNTER — Other Ambulatory Visit (HOSPITAL_COMMUNITY): Payer: Self-pay

## 2023-01-23 ENCOUNTER — Ambulatory Visit (HOSPITAL_BASED_OUTPATIENT_CLINIC_OR_DEPARTMENT_OTHER): Payer: Medicare HMO | Admitting: Physical Therapy

## 2023-01-23 ENCOUNTER — Encounter (HOSPITAL_BASED_OUTPATIENT_CLINIC_OR_DEPARTMENT_OTHER): Payer: Self-pay | Admitting: Physical Therapy

## 2023-01-23 DIAGNOSIS — M6281 Muscle weakness (generalized): Secondary | ICD-10-CM

## 2023-01-23 DIAGNOSIS — R262 Difficulty in walking, not elsewhere classified: Secondary | ICD-10-CM

## 2023-01-23 DIAGNOSIS — E782 Mixed hyperlipidemia: Secondary | ICD-10-CM

## 2023-01-23 NOTE — Therapy (Signed)
OUTPATIENT PHYSICAL THERAPY LOWER EXTREMITY TREATMENT   Patient Name: Terri Heath MRN: [id number] DOB:16-Nov-1955, 67 y.o., female Today's Date: 01/23/2023  END OF SESSION:  PT End of Session - 01/23/23 1621     Visit Number 5    Number of Visits 19    Date for PT Re-Evaluation 03/20/23    Authorization Type [employer] MCR    PT Start Time 1615    PT Stop Time 1655    PT Time Calculation (min) 40 min    Behavior During Therapy WFL for tasks assessed/performed             Past Medical History:  Diagnosis Date   Abnormal vaginal bleeding in postmenopausal patient 10/11/2021   Acute systolic heart failure (HCC)    Allergy    Asthma    Bilateral shoulder pain    Borderline hypertension    Bronchitis    CAD (coronary artery disease)    Chronic systolic CHF (congestive heart failure) (HCC)    Chronic systolic congestive heart failure, NYHA class 3 (HCC) 07/24/2018   Diabetes mellitus    Diagnosed in 2000, on insulin, Trulicity and metformin, not checking cgs at home   Drug-induced hypotension 12/20/2021   Gangrenous appendicitis with perforation s/p lap appendectomy 10/04/2017 10/04/2017   GERD (gastroesophageal reflux disease)    History of MI (myocardial infarction)    History of radiation therapy    Uterus- 12/14/21-02/01/22- Dr. Antony Blackbird   Hyperlipidemia    Hypertension    Hypothyroidism    MGUS (monoclonal gammopathy of unknown significance)    Mitral regurgitation    Myocardial infarction (HCC)    Neuropathy    Obesity    Sleep apnea    uses CPAP   Statin intolerance    Uterine cancer [facility])    Past Surgical History:  Procedure Laterality Date   BREATH TEK H PYLORI  09/18/2011   Procedure: BREATH TEK H PYLORI;  Surgeon: Kandis Cocking, MD;  Location: [facility];  Service: General;  Laterality: N/A;   CESAREAN SECTION     x 3   IR IMAGING GUIDED PORT INSERTION  12/07/2021   IR REMOVAL TUN ACCESS W/ PORT W/O FL MOD SED  04/04/2022   LAPAROSCOPIC  APPENDECTOMY N/A 10/03/2017   Procedure: APPENDECTOMY LAPAROSCOPIC;  Surgeon: Romie Levee, MD;  Location: [facility];  Service: General;  Laterality: N/A;   OPERATIVE ULTRASOUND N/A 02/01/2022   Procedure: OPERATIVE ULTRASOUND;  Surgeon: Antony Blackbird, MD;  Location: [facility];  Service: Urology;  Laterality: N/A;   RIGHT/LEFT HEART CATH AND CORONARY ANGIOGRAPHY N/A 02/12/2018   Procedure: RIGHT/LEFT HEART CATH AND CORONARY ANGIOGRAPHY;  Surgeon: Corky Crafts, MD;  Location: [facility];  Service: Cardiovascular;  Laterality: N/A;   ROBOTIC ASSISTED TOTAL HYSTERECTOMY WITH BILATERAL SALPINGO OOPHERECTOMY Bilateral 03/21/2022   Procedure: XI ROBOTIC ASSISTED TOTAL HYSTERECTOMY WITH BILATERAL SALPINGO OOPHORECTOMY, LYSIS OF ADHESIONS, CYSTOSCOPY;  Surgeon: Carver Fila, MD;  Location: [facility];  Service: Gynecology;  Laterality: Bilateral;   TANDEM RING INSERTION N/A 02/01/2022   Procedure: TANDEM RING INSERTION;  Surgeon: Antony Blackbird, MD;  Location: [facility];  Service: Urology;  Laterality: N/A;   TUBAL LIGATION     Patient Active Problem List   Diagnosis Date Noted   Closed fracture of distal fibula 07/17/2022   Hypomagnesemia 01/17/2022   Pancytopenia, acquired (HCC) 01/03/2022   Dehydration 12/20/2021   Elevated serum creatinine 12/20/2021   Diabetic peripheral neuropathy associated with type 2 diabetes mellitus (HCC)  12/02/2021   History of endometrial cancer 11/07/2021   Encounter to establish care 01/06/2021   Vitamin D deficiency 01/06/2021   Rash of back 01/06/2021   Claudication in peripheral vascular disease (HCC) 09/16/2020   Chronic systolic CHF (congestive heart failure), NYHA class 3 (HCC) 07/24/2018   Acquired hypothyroidism 07/24/2018   Cardiomyopathy, ischemic 07/24/2018   MGUS (monoclonal gammopathy of unknown significance) 03/06/2018   Dyspnea 01/14/2018   Allergic rhinitis 06/11/2014   Asthma due to seasonal allergies 10/24/2012   OSA (obstructive sleep  apnea) 05/23/2011   Poorly controlled type 2 diabetes mellitus with circulatory disorder (HCC) 03/26/2007   Hyperlipidemia associated with type 2 diabetes mellitus (HCC) 03/26/2007   Obesity, Class III, BMI 40-49.9 (morbid obesity) (HCC) 03/26/2007   Hypertension associated with type 2 diabetes mellitus (HCC) 03/26/2007    PCP: Tollie Eth, NP   REFERRING PROVIDER: de [place], Raymond J, MD   REFERRING DIAG:  S82.831D (ICD-10-CM) - Closed fracture of distal end of right fibula with routine healing, unspecified fracture morphology, subsequent encounter      THERAPY DIAG:  Muscle weakness (generalized)  Difficulty walking  Rationale for Evaluation and Treatment: Rehabilitation  ONSET DATE: 07/10/2022 Initial Fall  SUBJECTIVE:   SUBJECTIVE STATEMENT: Pt reports she  had a good weekend. She was not too sore after last session.      From eval:  Pt states her real problem stems from having her uterine cancer removed in 2023. Her fall that caused her ankle fx was due to the weakness into her LE vs ankle instability or faintness. She is here for weakness and deconditioning. Her ankle is not her major concern. She has difficulty with transfers and daily mobility. She has trouble with lifting type task and ADL due to the weakness. Doing laundry and unloading the dishwasher are difficult due to the low height. Pt has to have groceries delivered bc she is unable to walk the store but has trouble with getting them inside the house. Only able to walk for ~15 is minutes before stopping. Pt is most limited with ambulation and has to go down stairs backwards. Previously did aquatic exercise prior to cancer surgery. Comfortable with the water setting.   PERTINENT HISTORY: CHF, DM neuropathy (sock distribution), CAD, previous uterine cancer PAIN:  Are you having pain? yes : NPRS scale: 0.5/10 Location:  Lt knee Description: sharp/ tight  (aches at night) Aggravating: night time Relieving:  ?  Medication    PRECAUTIONS: Adhesive Allergy*  WEIGHT BEARING RESTRICTIONS: No  FALLS:  Has patient fallen in last 6 months? Yes. Number of falls 2, caught foot on an ottoman   LIVING ENVIRONMENT: Lives with: lives with their family and lives with their spouse Lives in: House/apartment Stairs: Yes, modified rise  Has following equipment at home: Single point cane, rails in bathroom   OCCUPATION: previously [employer], EKG   PLOF: Independent with basic ADLs  PATIENT GOALS: return to normal ADL, walk without a cane, "feel stronger":    OBJECTIVE:   DIAGNOSTIC FINDINGS:   FINDINGS: Similar-appearing mildly displaced distal fibular fracture. No new periosteal reaction identified. There is no evidence of new acute fracture, dislocation, or joint effusion. There is no evidence of arthropathy or other focal bone abnormality. Soft tissues are unremarkable.   IMPRESSION: Similar-appearing mildly displaced distal fibular fracture. No new periosteal reaction identified.    PATIENT SURVEYS:  Lower Extremity Functional Score: 16 / 80 = 20.0 %  COGNITION: Overall cognitive status: Within functional limits  for tasks assessed     SENSATION: Light touch: Impaired   POSTURE: rounded shoulders, increased thoracic kyphosis, anterior pelvic tilt, and flexed trunk    LOWER EXTREMITY ROM:  AROM of bilat hips limited by proximal LE weakness and body habitus; L LE AROM at the hip most limited with flexion and ER position  LOWER EXTREMITY MMT:  MMT Right eval Left eval  Hip flexion 4/5 4/5  Hip extension 4/5 4/5  Hip abduction 4/5 4/5  Hip adduction    Hip internal rotation    Hip external rotation 4/5 4/5  Knee flexion 4/5 4/5  Knee extension 4/5 4/5   (Blank rows = not tested)    FUNCTIONAL TESTS:  5 times sit to stand: 28.7s  TUG (modified): measured 58ft from treatment room door 16.5s  Unable to perform tandem or SLS  GAIT: Distance walked: 19ft Assistive  device utilized: Single point cane Level of assistance: Modified independence Comments: bilat compensated [place], decreased L step length, APT   TODAY'S TREATMENT:                                                                                                                              DATE: 01/19/23  Pt seen for aquatic therapy today.  Treatment took place in water 3.5-4.75 ft in depth at the [facility] pool. Temp of water was 91.  Pt entered/exited the pool via stairs independently with bilat rail. * unsupported: walking forward/ backwards multiple laps;  * side stepping with slight increase step height ("stepping over cord") * walking backward/forward with bilat rainbow hand floats at side under water for increased core engagement x 2 laps  * holding noodle:  heel raises x 10, leg swings into hip flex/extension x 5;  hip add/abd x 5; alternating single leg clams x 5  * return to walking forward/ backward with reciprocal arm swing * repeated above exercises holding wall instead of noodle * D1/2 diagonal pattern holding yellow ball in hands , with slight swivel of feet  * TrA set with solid noodle pull down to thighs in staggered stance x 5 reps each LE forward, then 5 with standard stance * return to walking forward *STS from bench onto pool floor  with forward arm reach x 5; repeated with blue step under feet x 7 *Seated on bench in water: cycling; hip add/abd; alternating LAQ with DF; hip add/abd x 10.   * SLS L/R x 2 reps (up to 8-15s);  tandem stance x 15-20 sec  - both exercises using UE in water to steady  Pt requires the buoyancy and hydrostatic pressure of water for support, and to offload joints by unweighting joint load by at least 50 % in navel deep water and by at least 75-80% in chest to neck deep water.  Viscosity of the water is needed for resistance of strengthening. Water current perturbations provides challenge to standing balance requiring increased core  activation.  PATIENT  EDUCATION:  Education details: aquatic therapy exercise progressions/modifications  Person educated: Patient Education method: Explanation, Demonstration, Tactile cues, Verbal cues,  Education comprehension: verbalized understanding, returned demonstration, verbal cues required, and tactile cues required  HOME EXERCISE PROGRAM:  Access Code: [email or web address] URL: [email or web address] Date: 12/20/2022 Prepared by: Zebedee Iba  ASSESSMENT:  CLINICAL IMPRESSION: Pt tolerating aquatic exercises well, without increase in pain.  Trialed SLS and tandem stance, as well as wood chop D1/2 motion with good tolerance.  Plan to progress gradually as to not aggravate LE symptoms in between sessions.  Pt would benefit from continued skilled therapy in order to reach goals and maximize functional generalized strength and ROM for full return to PLOF. Progressing towards goals.    OBJECTIVE IMPAIRMENTS: decreased activity tolerance, decreased balance, decreased endurance, decreased mobility, decreased ROM, decreased strength, hypomobility, increased muscle spasms, impaired flexibility, improper body mechanics, postural dysfunction, obesity, and pain.    ACTIVITY LIMITATIONS: lifting, squatting, locomotion level, and dressing   PARTICIPATION LIMITATIONS: interpersonal relationship, community activity, occupation, and exercise   PERSONAL FACTORS: Age, Fitness, Lifestyle, Past/current experiences, and Time since onset of injury/illness/exacerbation, and 2-3 comorbidities are also affecting patient's functional outcome.    REHAB POTENTIAL: Fair   CLINICAL DECISION MAKING: unstable/complicated   EVALUATION COMPLEXITY: Moderate     GOALS:     SHORT TERM GOALS: Target date: 01/31/2023     Pt will become independent with HEP in order to demonstrate synthesis of PT education.   Goal status: INITIAL   2. Pt will be able to demonstrate full hip/knee AROM in order to  demonstrate functional improvement in LE function for self-care and house hold duties.      Goal status: INITIAL   3.  Pt will report at least 2 pt reduction on NPRS scale for pain in order to demonstrate functional improvement with household activity, self care, and ADL.    Goal status:MET - 01/23/23   LONG TERM GOALS: Target date: 03/14/2023       Pt  will become independent with final HEP in order to demonstrate synthesis of PT education.   Goal status: INITIAL   2.  Pt will have an at least 18 pt improvement in LEFS measure in order to demonstrate MCID improvement in daily function.    Goal status: INITIAL   3.  Pt will be able to lift/squat/hold >10 lbs in order to demonstrate functional improvement in lumbopelvic strength for return to ADL and household activity.    Goal status: INITIAL   4.  Pt will be able to demonstrate reciprocal stair stepping with ascent and descent with single UE in order to demonstrate functional improvement in LE function for self-care and community ambulation.   Goal status: INITIAL   5. Pt will be able to demonstrate/report ability to walk >20 mins in order to demonstrate functional improvement and tolerance to exercise and community mobility.   Goal status: INITIAL     PLAN:   PT FREQUENCY: 1-2x/week   PT DURATION: 12 weeks (plan to D/C in 8-10 wks)   PLANNED INTERVENTIONS: Therapeutic exercises, Therapeutic activity, Neuromuscular re-education, Balance training, Gait training, Patient/Family education, Self Care, Joint mobilization, Joint manipulation, Stair training, Aquatic Therapy, Dry Needling, Electrical stimulation, Spinal manipulation, Spinal mobilization, Cryotherapy, Moist heat, scar mobilization, Splintting, Taping, Vasopneumatic device, Traction, Ultrasound, Ionotophoresis 4mg /ml Dexamethasone, Manual therapy, and Re-evaluation   PLAN FOR NEXT SESSION: when able, cont aquatics: general conditioning, LE strength and exercise     Mayer Camel, PTA 01/23/23 5:07 PM  [facility] [address] [place], [place], [address] Phone: [phone]   Fax:  [phone]   Referring diagnosis? M62.81, r26.2 Treatment diagnosis? (if different than referring diagnosis)  S82.831D (ICD-10-CM) - Closed fracture of distal end of right fibula with routine healing, unspecified fracture morphology, subsequent encounter   What was this (referring dx) caused by? []  Surgery [x]  Fall [x]  Ongoing issue []  Arthritis []  Other: ____________  Laterality: []  Rt []  Lt [x]  Both  Check all possible CPT codes:      []  97110 (Therapeutic Exercise)  []  92507 (SLP Treatment)  []  97112 (Neuro Re-ed)   []  92526 (Swallowing Treatment)   []  97116 (Gait Training)   []  [id number] (Cognitive Training, 1st 15 minutes) []  97140 (Manual Therapy)   []  97130 (Cognitive Training, each add'l 15 minutes)  []  97530 (Therapeutic Activities)  []  Other, List CPT Code ____________    []  97535 (Self Care)       [x]  All codes above (97110 - 97535)  [x]  97012 (Mechanical Traction)  [x]  97014 (E-stim Unattended)  [x]  97032 (E-stim manual)  [x]  97033 (Ionto)  [x]  97035 (Ultrasound)  []  97760 (Orthotic Fit) []  97750 (Physical Performance Training) [x]  [id number] (Aquatic Therapy) []  97034 (Contrast Bath) []  [id number] (Paraffin) []  97597 (Wound Care 1st 20 sq cm) []  97598 (Wound Care each add'l 20 sq cm) []  97016 (Vasopneumatic Device) []  [id number] Public affairs consultant) []  [id number] (Prosthetic Training)

## 2023-01-24 ENCOUNTER — Other Ambulatory Visit: Payer: Self-pay

## 2023-01-24 MED ORDER — REPATHA SURECLICK 140 MG/ML ~~LOC~~ SOAJ
140.0000 mg | SUBCUTANEOUS | 3 refills | Status: DC
  Filled 2023-01-24: qty 6, 84d supply, fill #0
  Filled 2023-04-16: qty 6, 84d supply, fill #1
  Filled 2023-07-17: qty 6, 84d supply, fill #0
  Filled 2023-10-05 – 2023-10-12 (×3): qty 6, 84d supply, fill #1
  Filled 2023-10-15 – 2023-10-22 (×3): qty 6, 84d supply, fill #0

## 2023-01-25 ENCOUNTER — Ambulatory Visit (HOSPITAL_BASED_OUTPATIENT_CLINIC_OR_DEPARTMENT_OTHER): Payer: Medicare HMO | Admitting: Physical Therapy

## 2023-01-25 ENCOUNTER — Encounter (HOSPITAL_BASED_OUTPATIENT_CLINIC_OR_DEPARTMENT_OTHER): Payer: Self-pay | Admitting: Physical Therapy

## 2023-01-25 DIAGNOSIS — R262 Difficulty in walking, not elsewhere classified: Secondary | ICD-10-CM

## 2023-01-25 DIAGNOSIS — M6281 Muscle weakness (generalized): Secondary | ICD-10-CM | POA: Diagnosis not present

## 2023-01-25 NOTE — Therapy (Signed)
OUTPATIENT PHYSICAL THERAPY LOWER EXTREMITY TREATMENT   Patient Name: Terri Heath MRN: [id number] DOB:11/16/1955, 67 y.o., female Today's Date: 01/25/2023  END OF SESSION:  PT End of Session - 01/25/23 1501     Visit Number 6    Number of Visits 19    Date for PT Re-Evaluation 03/20/23    Authorization Type [employer] MCR    PT Start Time 1447    PT Stop Time 1530    PT Time Calculation (min) 43 min    Behavior During Therapy WFL for tasks assessed/performed             Past Medical History:  Diagnosis Date   Abnormal vaginal bleeding in postmenopausal patient 10/11/2021   Acute systolic heart failure (HCC)    Allergy    Asthma    Bilateral shoulder pain    Borderline hypertension    Bronchitis    CAD (coronary artery disease)    Chronic systolic CHF (congestive heart failure) (HCC)    Chronic systolic congestive heart failure, NYHA class 3 (HCC) 07/24/2018   Diabetes mellitus    Diagnosed in 2000, on insulin, Trulicity and metformin, not checking cgs at home   Drug-induced hypotension 12/20/2021   Gangrenous appendicitis with perforation s/p lap appendectomy 10/04/2017 10/04/2017   GERD (gastroesophageal reflux disease)    History of MI (myocardial infarction)    History of radiation therapy    Uterus- 12/14/21-02/01/22- Dr. Antony Blackbird   Hyperlipidemia    Hypertension    Hypothyroidism    MGUS (monoclonal gammopathy of unknown significance)    Mitral regurgitation    Myocardial infarction (HCC)    Neuropathy    Obesity    Sleep apnea    uses CPAP   Statin intolerance    Uterine cancer [facility])    Past Surgical History:  Procedure Laterality Date   BREATH TEK H PYLORI  09/18/2011   Procedure: BREATH TEK H PYLORI;  Surgeon: Kandis Cocking, MD;  Location: [facility];  Service: General;  Laterality: N/A;   CESAREAN SECTION     x 3   IR IMAGING GUIDED PORT INSERTION  12/07/2021   IR REMOVAL TUN ACCESS W/ PORT W/O FL MOD SED  04/04/2022   LAPAROSCOPIC  APPENDECTOMY N/A 10/03/2017   Procedure: APPENDECTOMY LAPAROSCOPIC;  Surgeon: Romie Levee, MD;  Location: [facility];  Service: General;  Laterality: N/A;   OPERATIVE ULTRASOUND N/A 02/01/2022   Procedure: OPERATIVE ULTRASOUND;  Surgeon: Antony Blackbird, MD;  Location: [facility];  Service: Urology;  Laterality: N/A;   RIGHT/LEFT HEART CATH AND CORONARY ANGIOGRAPHY N/A 02/12/2018   Procedure: RIGHT/LEFT HEART CATH AND CORONARY ANGIOGRAPHY;  Surgeon: Corky Crafts, MD;  Location: [facility] INVASIVE CV LAB;  Service: Cardiovascular;  Laterality: N/A;   ROBOTIC ASSISTED TOTAL HYSTERECTOMY WITH BILATERAL SALPINGO OOPHERECTOMY Bilateral 03/21/2022   Procedure: XI ROBOTIC ASSISTED TOTAL HYSTERECTOMY WITH BILATERAL SALPINGO OOPHORECTOMY, LYSIS OF ADHESIONS, CYSTOSCOPY;  Surgeon: Carver Fila, MD;  Location: [facility];  Service: Gynecology;  Laterality: Bilateral;   TANDEM RING INSERTION N/A 02/01/2022   Procedure: TANDEM RING INSERTION;  Surgeon: Antony Blackbird, MD;  Location: [facility];  Service: Urology;  Laterality: N/A;   TUBAL LIGATION     Patient Active Problem List   Diagnosis Date Noted   Closed fracture of distal fibula 07/17/2022   Hypomagnesemia 01/17/2022   Pancytopenia, acquired (HCC) 01/03/2022   Dehydration 12/20/2021   Elevated serum creatinine 12/20/2021   Diabetic peripheral neuropathy associated with type 2 diabetes mellitus (HCC)  12/02/2021   History of endometrial cancer 11/07/2021   Encounter to establish care 01/06/2021   Vitamin D deficiency 01/06/2021   Rash of back 01/06/2021   Claudication in peripheral vascular disease (HCC) 09/16/2020   Chronic systolic CHF (congestive heart failure), NYHA class 3 (HCC) 07/24/2018   Acquired hypothyroidism 07/24/2018   Cardiomyopathy, ischemic 07/24/2018   MGUS (monoclonal gammopathy of unknown significance) 03/06/2018   Dyspnea 01/14/2018   Allergic rhinitis 06/11/2014   Asthma due to seasonal allergies 10/24/2012   OSA (obstructive sleep  apnea) 05/23/2011   Poorly controlled type 2 diabetes mellitus with circulatory disorder (HCC) 03/26/2007   Hyperlipidemia associated with type 2 diabetes mellitus (HCC) 03/26/2007   Obesity, Class III, BMI 40-49.9 (morbid obesity) (HCC) 03/26/2007   Hypertension associated with type 2 diabetes mellitus (HCC) 03/26/2007    PCP: Tollie Eth, NP   REFERRING PROVIDER: de [place], Raymond J, MD   REFERRING DIAG:  S82.831D (ICD-10-CM) - Closed fracture of distal end of right fibula with routine healing, unspecified fracture morphology, subsequent encounter      THERAPY DIAG:  Muscle weakness (generalized)  Difficulty walking  Rationale for Evaluation and Treatment: Rehabilitation  ONSET DATE: 07/10/2022 Initial Fall  SUBJECTIVE:   SUBJECTIVE STATEMENT: Pt reports she was not too sore after last session.      From eval:  Pt states her real problem stems from having her uterine cancer removed in 2023. Her fall that caused her ankle fx was due to the weakness into her LE vs ankle instability or faintness. She is here for weakness and deconditioning. Her ankle is not her major concern. She has difficulty with transfers and daily mobility. She has trouble with lifting type task and ADL due to the weakness. Doing laundry and unloading the dishwasher are difficult due to the low height. Pt has to have groceries delivered bc she is unable to walk the store but has trouble with getting them inside the house. Only able to walk for ~15 is minutes before stopping. Pt is most limited with ambulation and has to go down stairs backwards. Previously did aquatic exercise prior to cancer surgery. Comfortable with the water setting.   PERTINENT HISTORY: CHF, DM neuropathy (sock distribution), CAD, previous uterine cancer PAIN:  Are you having pain? no : NPRS scale: 0/10 Location:  Lt knee Description:  Aggravating: night time Relieving:  ? Medication    PRECAUTIONS: Adhesive Allergy*  WEIGHT  BEARING RESTRICTIONS: No  FALLS:  Has patient fallen in last 6 months? Yes. Number of falls 2, caught foot on an ottoman   LIVING ENVIRONMENT: Lives with: lives with their family and lives with their spouse Lives in: House/apartment Stairs: Yes, modified rise  Has following equipment at home: Single point cane, rails in bathroom   OCCUPATION: previously [employer], EKG   PLOF: Independent with basic ADLs  PATIENT GOALS: return to normal ADL, walk without a cane, "feel stronger":    OBJECTIVE:   DIAGNOSTIC FINDINGS:   FINDINGS: Similar-appearing mildly displaced distal fibular fracture. No new periosteal reaction identified. There is no evidence of new acute fracture, dislocation, or joint effusion. There is no evidence of arthropathy or other focal bone abnormality. Soft tissues are unremarkable.   IMPRESSION: Similar-appearing mildly displaced distal fibular fracture. No new periosteal reaction identified.    PATIENT SURVEYS:  Lower Extremity Functional Score: 16 / 80 = 20.0 %  COGNITION: Overall cognitive status: Within functional limits for tasks assessed     SENSATION: Light touch: Impaired  POSTURE: rounded shoulders, increased thoracic kyphosis, anterior pelvic tilt, and flexed trunk    LOWER EXTREMITY ROM:  AROM of bilat hips limited by proximal LE weakness and body habitus; L LE AROM at the hip most limited with flexion and ER position  LOWER EXTREMITY MMT:  MMT Right eval Left eval  Hip flexion 4/5 4/5  Hip extension 4/5 4/5  Hip abduction 4/5 4/5  Hip adduction    Hip internal rotation    Hip external rotation 4/5 4/5  Knee flexion 4/5 4/5  Knee extension 4/5 4/5   (Blank rows = not tested)    FUNCTIONAL TESTS:  5 times sit to stand: 28.7s  TUG (modified): measured 32ft from treatment room door 16.5s  Unable to perform tandem or SLS  GAIT: Distance walked: 55ft Assistive device utilized: Single point cane Level of assistance:  Modified independence Comments: bilat compensated [place], decreased L step length, APT   TODAY'S TREATMENT:                                                                                                                              DATE: 01/25/23  Pt seen for aquatic therapy today.  Treatment took place in water 3.5-4.75 ft in depth at the [facility] pool. Temp of water was 91.  Pt entered/exited the pool via stairs independently with bilat rail. * unsupported: walking forward/ backwards x 3 laps ;  * side stepping with slight increase step height x 3 laps  * holding noodle:  heel raises x 10, leg swings into hip flex/extension x 10;  hip add/abd x 10 * marching forward/ backward  * tandem stance x 20s no UE support; SLS x 10-15 sec; tandem gait forward/backward with cues for technique and to slow speed * return to walking forward/ backward with reciprocal arm swing * kickboard row in staggered stance x 10 each LE forward, then repeated at 10+2 o'clock *STS from bench with forward arm reach with blue step under feet x 10 - 4 sec eccentric lowering to bench *Seated on bench in water: cycling; hip add/abd; alternating LAQ with DF; hip add/abd - 2 rounds    * TrA set with solid noodle pull down to thighs in staggered stance x 5 reps each LE forward, then 5 with standard stance * seated short noodle pull down with TrA set   Pt requires the buoyancy and hydrostatic pressure of water for support, and to offload joints by unweighting joint load by at least 50 % in navel deep water and by at least 75-80% in chest to neck deep water.  Viscosity of the water is needed for resistance of strengthening. Water current perturbations provides challenge to standing balance requiring increased core activation.  PATIENT EDUCATION:  Education details: aquatic therapy exercise progressions/modifications  Person educated: Patient Education method: Explanation, Demonstration, Tactile cues, Verbal  cues,  Education comprehension: verbalized understanding, returned demonstration, verbal cues required, and tactile cues required  HOME  EXERCISE PROGRAM:  Access Code: [email or web address] URL: [email or web address] Date: 12/20/2022 Prepared by: Zebedee Iba  ASSESSMENT:  CLINICAL IMPRESSION: RPE remained 4 or less out of 10. Pt tolerating aquatic exercises well, with only minor increase in Lt knee pain to 0.5.  Plan to progress gradually as to not aggravate LE symptoms in between sessions.  Pt would benefit from continued skilled therapy in order to reach goals and maximize functional generalized strength and ROM for full return to PLOF. Progressing well towards goals.    OBJECTIVE IMPAIRMENTS: decreased activity tolerance, decreased balance, decreased endurance, decreased mobility, decreased ROM, decreased strength, hypomobility, increased muscle spasms, impaired flexibility, improper body mechanics, postural dysfunction, obesity, and pain.    ACTIVITY LIMITATIONS: lifting, squatting, locomotion level, and dressing   PARTICIPATION LIMITATIONS: interpersonal relationship, community activity, occupation, and exercise   PERSONAL FACTORS: Age, Fitness, Lifestyle, Past/current experiences, and Time since onset of injury/illness/exacerbation, and 2-3 comorbidities are also affecting patient's functional outcome.    REHAB POTENTIAL: Fair   CLINICAL DECISION MAKING: unstable/complicated   EVALUATION COMPLEXITY: Moderate     GOALS:     SHORT TERM GOALS: Target date: 01/31/2023     Pt will become independent with HEP in order to demonstrate synthesis of PT education.   Goal status: INITIAL   2. Pt will be able to demonstrate full hip/knee AROM in order to demonstrate functional improvement in LE function for self-care and house hold duties.      Goal status: INITIAL   3.  Pt will report at least 2 pt reduction on NPRS scale for pain in order to demonstrate functional improvement  with household activity, self care, and ADL.    Goal status:MET - 01/23/23   LONG TERM GOALS: Target date: 03/14/2023       Pt  will become independent with final HEP in order to demonstrate synthesis of PT education.   Goal status: INITIAL   2.  Pt will have an at least 18 pt improvement in LEFS measure in order to demonstrate MCID improvement in daily function.    Goal status: INITIAL   3.  Pt will be able to lift/squat/hold >10 lbs in order to demonstrate functional improvement in lumbopelvic strength for return to ADL and household activity.    Goal status: INITIAL   4.  Pt will be able to demonstrate reciprocal stair stepping with ascent and descent with single UE in order to demonstrate functional improvement in LE function for self-care and community ambulation.   Goal status: INITIAL   5. Pt will be able to demonstrate/report ability to walk >20 mins in order to demonstrate functional improvement and tolerance to exercise and community mobility.   Goal status: INITIAL     PLAN:   PT FREQUENCY: 1-2x/week   PT DURATION: 12 weeks (plan to D/C in 8-10 wks)   PLANNED INTERVENTIONS: Therapeutic exercises, Therapeutic activity, Neuromuscular re-education, Balance training, Gait training, Patient/Family education, Self Care, Joint mobilization, Joint manipulation, Stair training, Aquatic Therapy, Dry Needling, Electrical stimulation, Spinal manipulation, Spinal mobilization, Cryotherapy, Moist heat, scar mobilization, Splintting, Taping, Vasopneumatic device, Traction, Ultrasound, Ionotophoresis 4mg /ml Dexamethasone, Manual therapy, and Re-evaluation   PLAN FOR NEXT SESSION: when able, cont aquatics: general conditioning, LE strength and exercise   Mayer Camel, PTA 01/25/23 3:42 PM [facility] [address] [place], [place], [address] Phone: [phone]   Fax:  [phone]   Referring diagnosis? M62.81,  r26.2 Treatment diagnosis? (if different than referring diagnosis)  S82.831D (ICD-10-CM) - Closed  fracture of distal end of right fibula with routine healing, unspecified fracture morphology, subsequent encounter   What was this (referring dx) caused by? []  Surgery [x]  Fall [x]  Ongoing issue []  Arthritis []  Other: ____________  Laterality: []  Rt []  Lt [x]  Both  Check all possible CPT codes:      []  97110 (Therapeutic Exercise)  []  92507 (SLP Treatment)  []  97112 (Neuro Re-ed)   []  92526 (Swallowing Treatment)   []  97116 (Gait Training)   []  [id number] (Cognitive Training, 1st 15 minutes) []  97140 (Manual Therapy)   []  97130 (Cognitive Training, each add'l 15 minutes)  []  97530 (Therapeutic Activities)  []  Other, List CPT Code ____________    []  97535 (Self Care)       [x]  All codes above (97110 - 97535)  [x]  97012 (Mechanical Traction)  [x]  97014 (E-stim Unattended)  [x]  97032 (E-stim manual)  [x]  97033 (Ionto)  [x]  97035 (Ultrasound)  []  97760 (Orthotic Fit) []  97750 (Physical Performance Training) [x]  [id number] (Aquatic Therapy) []  97034 (Contrast Bath) []  [id number] (Paraffin) []  97597 (Wound Care 1st 20 sq cm) []  97598 (Wound Care each add'l 20 sq cm) []  97016 (Vasopneumatic Device) []  [id number] Public affairs consultant) []  [id number] (Prosthetic Training)

## 2023-01-29 ENCOUNTER — Other Ambulatory Visit (HOSPITAL_COMMUNITY): Payer: Self-pay

## 2023-01-29 MED ORDER — MOXIFLOXACIN HCL 0.5 % OP SOLN
1.0000 [drp] | Freq: Four times a day (QID) | OPHTHALMIC | 1 refills | Status: DC
  Filled 2023-01-29 – 2023-01-30 (×2): qty 3, 15d supply, fill #0
  Filled 2023-02-16: qty 3, 15d supply, fill #1

## 2023-01-29 MED ORDER — PREDNISOLONE ACETATE 1 % OP SUSP
1.0000 [drp] | Freq: Four times a day (QID) | OPHTHALMIC | 1 refills | Status: DC
  Filled 2023-01-29 – 2023-01-30 (×2): qty 5, 25d supply, fill #0
  Filled 2023-02-16: qty 5, 25d supply, fill #1

## 2023-01-29 MED ORDER — KETOROLAC TROMETHAMINE 0.5 % OP SOLN
1.0000 [drp] | Freq: Four times a day (QID) | OPHTHALMIC | 1 refills | Status: DC
  Filled 2023-01-29 – 2023-01-30 (×2): qty 5, 25d supply, fill #0
  Filled 2023-02-16: qty 5, 25d supply, fill #1

## 2023-01-30 ENCOUNTER — Other Ambulatory Visit (HOSPITAL_COMMUNITY): Payer: Self-pay

## 2023-01-30 ENCOUNTER — Encounter (HOSPITAL_BASED_OUTPATIENT_CLINIC_OR_DEPARTMENT_OTHER): Payer: Self-pay | Admitting: Physical Therapy

## 2023-01-30 ENCOUNTER — Ambulatory Visit (HOSPITAL_BASED_OUTPATIENT_CLINIC_OR_DEPARTMENT_OTHER): Payer: Medicare HMO | Attending: Family Medicine | Admitting: Physical Therapy

## 2023-01-30 ENCOUNTER — Other Ambulatory Visit (HOSPITAL_BASED_OUTPATIENT_CLINIC_OR_DEPARTMENT_OTHER): Payer: Self-pay

## 2023-01-30 DIAGNOSIS — R262 Difficulty in walking, not elsewhere classified: Secondary | ICD-10-CM | POA: Insufficient documentation

## 2023-01-30 DIAGNOSIS — M6281 Muscle weakness (generalized): Secondary | ICD-10-CM | POA: Diagnosis not present

## 2023-01-30 NOTE — Therapy (Signed)
OUTPATIENT PHYSICAL THERAPY LOWER EXTREMITY TREATMENT   Patient Name: Terri Heath MRN: [id number] DOB:16-Dec-1955, 67 y.o., female Today's Date: 01/30/2023  END OF SESSION:  PT End of Session - 01/30/23 1530     Visit Number 7    Number of Visits 19    Date for PT Re-Evaluation 03/20/23    Authorization Type [employer] MCR    PT Start Time 1447    PT Stop Time 1530    PT Time Calculation (min) 43 min    Activity Tolerance Patient tolerated treatment well    Behavior During Therapy WFL for tasks assessed/performed             Past Medical History:  Diagnosis Date   Abnormal vaginal bleeding in postmenopausal patient 10/11/2021   Acute systolic heart failure (HCC)    Allergy    Asthma    Bilateral shoulder pain    Borderline hypertension    Bronchitis    CAD (coronary artery disease)    Chronic systolic CHF (congestive heart failure) (HCC)    Chronic systolic congestive heart failure, NYHA class 3 (HCC) 07/24/2018   Diabetes mellitus    Diagnosed in 2000, on insulin, Trulicity and metformin, not checking cgs at home   Drug-induced hypotension 12/20/2021   Gangrenous appendicitis with perforation s/p lap appendectomy 10/04/2017 10/04/2017   GERD (gastroesophageal reflux disease)    History of MI (myocardial infarction)    History of radiation therapy    Uterus- 12/14/21-02/01/22- Dr. Antony Blackbird   Hyperlipidemia    Hypertension    Hypothyroidism    MGUS (monoclonal gammopathy of unknown significance)    Mitral regurgitation    Myocardial infarction (HCC)    Neuropathy    Obesity    Sleep apnea    uses CPAP   Statin intolerance    Uterine cancer [facility])    Past Surgical History:  Procedure Laterality Date   BREATH TEK H PYLORI  09/18/2011   Procedure: BREATH TEK H PYLORI;  Surgeon: Kandis Cocking, MD;  Location: [facility];  Service: General;  Laterality: N/A;   CESAREAN SECTION     x 3   IR IMAGING GUIDED PORT INSERTION  12/07/2021   IR REMOVAL TUN ACCESS  W/ PORT W/O FL MOD SED  04/04/2022   LAPAROSCOPIC APPENDECTOMY N/A 10/03/2017   Procedure: APPENDECTOMY LAPAROSCOPIC;  Surgeon: Romie Levee, MD;  Location: [facility];  Service: General;  Laterality: N/A;   OPERATIVE ULTRASOUND N/A 02/01/2022   Procedure: OPERATIVE ULTRASOUND;  Surgeon: Antony Blackbird, MD;  Location: [facility];  Service: Urology;  Laterality: N/A;   RIGHT/LEFT HEART CATH AND CORONARY ANGIOGRAPHY N/A 02/12/2018   Procedure: RIGHT/LEFT HEART CATH AND CORONARY ANGIOGRAPHY;  Surgeon: Corky Crafts, MD;  Location: [facility] CV [facility];  Service: Cardiovascular;  Laterality: N/A;   ROBOTIC ASSISTED TOTAL HYSTERECTOMY WITH BILATERAL SALPINGO OOPHERECTOMY Bilateral 03/21/2022   Procedure: XI ROBOTIC ASSISTED TOTAL HYSTERECTOMY WITH BILATERAL SALPINGO OOPHORECTOMY, LYSIS OF ADHESIONS, CYSTOSCOPY;  Surgeon: Carver Fila, MD;  Location: [facility];  Service: Gynecology;  Laterality: Bilateral;   TANDEM RING INSERTION N/A 02/01/2022   Procedure: TANDEM RING INSERTION;  Surgeon: Antony Blackbird, MD;  Location: [facility];  Service: Urology;  Laterality: N/A;   TUBAL LIGATION     Patient Active Problem List   Diagnosis Date Noted   Closed fracture of distal fibula 07/17/2022   Hypomagnesemia 01/17/2022   Pancytopenia, acquired (HCC) 01/03/2022   Dehydration 12/20/2021   Elevated serum creatinine 12/20/2021   Diabetic  peripheral neuropathy associated with type 2 diabetes mellitus (HCC) 12/02/2021   History of endometrial cancer 11/07/2021   Encounter to establish care 01/06/2021   Vitamin D deficiency 01/06/2021   Rash of back 01/06/2021   Claudication in peripheral vascular disease (HCC) 09/16/2020   Chronic systolic CHF (congestive heart failure), NYHA class 3 (HCC) 07/24/2018   Acquired hypothyroidism 07/24/2018   Cardiomyopathy, ischemic 07/24/2018   MGUS (monoclonal gammopathy of unknown significance) 03/06/2018   Dyspnea 01/14/2018   Allergic rhinitis 06/11/2014   Asthma due to seasonal  allergies 10/24/2012   OSA (obstructive sleep apnea) 05/23/2011   Poorly controlled type 2 diabetes mellitus with circulatory disorder (HCC) 03/26/2007   Hyperlipidemia associated with type 2 diabetes mellitus (HCC) 03/26/2007   Obesity, Class III, BMI 40-49.9 (morbid obesity) (HCC) 03/26/2007   Hypertension associated with type 2 diabetes mellitus (HCC) 03/26/2007    PCP: Tollie Eth, NP   REFERRING PROVIDER: de [place], Raymond J, MD   REFERRING DIAG:  S82.831D (ICD-10-CM) - Closed fracture of distal end of right fibula with routine healing, unspecified fracture morphology, subsequent encounter      THERAPY DIAG:  Muscle weakness (generalized)  Difficulty walking  Rationale for Evaluation and Treatment: Rehabilitation  ONSET DATE: 07/10/2022 Initial Fall  SUBJECTIVE:   SUBJECTIVE STATEMENT: Pt states she is doing well.  Getting better. Able to stand/walk up towards 25 minutes with needing rest from 10-15 min      From eval:  Pt states her real problem stems from having her uterine cancer removed in 2023. Her fall that caused her ankle fx was due to the weakness into her LE vs ankle instability or faintness. She is here for weakness and deconditioning. Her ankle is not her major concern. She has difficulty with transfers and daily mobility. She has trouble with lifting type task and ADL due to the weakness. Doing laundry and unloading the dishwasher are difficult due to the low height. Pt has to have groceries delivered bc she is unable to walk the store but has trouble with getting them inside the house. Only able to walk for ~15 is minutes before stopping. Pt is most limited with ambulation and has to go down stairs backwards. Previously did aquatic exercise prior to cancer surgery. Comfortable with the water setting.   PERTINENT HISTORY: CHF, DM neuropathy (sock distribution), CAD, previous uterine cancer PAIN:  Are you having pain? no : NPRS scale: 0/10 Location:  Lt  knee Description:  Aggravating: night time Relieving:  ? Medication    PRECAUTIONS: Adhesive Allergy*  WEIGHT BEARING RESTRICTIONS: No  FALLS:  Has patient fallen in last 6 months? Yes. Number of falls 2, caught foot on an ottoman   LIVING ENVIRONMENT: Lives with: lives with their family and lives with their spouse Lives in: House/apartment Stairs: Yes, modified rise  Has following equipment at home: Single point cane, rails in bathroom   OCCUPATION: previously [employer], EKG   PLOF: Independent with basic ADLs  PATIENT GOALS: return to normal ADL, walk without a cane, "feel stronger":    OBJECTIVE:   DIAGNOSTIC FINDINGS:   FINDINGS: Similar-appearing mildly displaced distal fibular fracture. No new periosteal reaction identified. There is no evidence of new acute fracture, dislocation, or joint effusion. There is no evidence of arthropathy or other focal bone abnormality. Soft tissues are unremarkable.   IMPRESSION: Similar-appearing mildly displaced distal fibular fracture. No new periosteal reaction identified.    PATIENT SURVEYS:  Lower Extremity Functional Score: 16 / 80 =  20.0 %  COGNITION: Overall cognitive status: Within functional limits for tasks assessed     SENSATION: Light touch: Impaired   POSTURE: rounded shoulders, increased thoracic kyphosis, anterior pelvic tilt, and flexed trunk    LOWER EXTREMITY ROM:  AROM of bilat hips limited by proximal LE weakness and body habitus; L LE AROM at the hip most limited with flexion and ER position  LOWER EXTREMITY MMT:  MMT Right eval Left eval  Hip flexion 4/5 4/5  Hip extension 4/5 4/5  Hip abduction 4/5 4/5  Hip adduction    Hip internal rotation    Hip external rotation 4/5 4/5  Knee flexion 4/5 4/5  Knee extension 4/5 4/5   (Blank rows = not tested)    FUNCTIONAL TESTS:  5 times sit to stand: 28.7s  TUG (modified): measured 10ft from treatment room door 16.5s  Unable to perform  tandem or SLS  GAIT: Distance walked: 16ft Assistive device utilized: Single point cane Level of assistance: Modified independence Comments: bilat compensated [place], decreased L step length, APT   TODAY'S TREATMENT:                                                                                                                              DATE: 01/25/23  Pt seen for aquatic therapy today.  Treatment took place in water 3.5-4.75 ft in depth at the [facility] pool. Temp of water was 91.  Pt entered/exited the pool via stairs independently with bilat rail. * unsupported: walking forward/ backwards; side stepping * holding noodle:  heel raises x 10, leg swings into hip flex/extension x 10;  hip add/abd x 10; 3 way tap x 10 * tandem stance x 20s no UE support; SLS x 10-15 sec; tandem gait forward/backward with cues for technique and to slow speed *Cycling on noodle: hip add/abd; skiing * TrA set with solid noodle pull down to thighs in staggered stance x 10 reps each LE forward, then x 10 with standard stance 4.0 ft * kickboard row in staggered stance x 10 each LE forward, then repeated at 10+2 o'clock    Pt requires the buoyancy and hydrostatic pressure of water for support, and to offload joints by unweighting joint load by at least 50 % in navel deep water and by at least 75-80% in chest to neck deep water.  Viscosity of the water is needed for resistance of strengthening. Water current perturbations provides challenge to standing balance requiring increased core activation.  PATIENT EDUCATION:  Education details: aquatic therapy exercise progressions/modifications  Person educated: Patient Education method: Explanation, Demonstration, Tactile cues, Verbal cues,  Education comprehension: verbalized understanding, returned demonstration, verbal cues required, and tactile cues required  HOME EXERCISE PROGRAM:  Access Code: [email or web address] URL:  [email or web address] Date: 12/20/2022 Prepared by: Zebedee Iba  ASSESSMENT:  CLINICAL IMPRESSION: Pt reports compliance with theraband exercises as instructed land based.  Progressed balance challenges seated on noodle to increase core engagement with good  response. She does reports some left knee discomfort with balance challenges encouraging ankle strategy. She does have complete reduction of pain upon completion. Pt reporting improved toleration to activity at home with house chores.     OBJECTIVE IMPAIRMENTS: decreased activity tolerance, decreased balance, decreased endurance, decreased mobility, decreased ROM, decreased strength, hypomobility, increased muscle spasms, impaired flexibility, improper body mechanics, postural dysfunction, obesity, and pain.    ACTIVITY LIMITATIONS: lifting, squatting, locomotion level, and dressing   PARTICIPATION LIMITATIONS: interpersonal relationship, community activity, occupation, and exercise   PERSONAL FACTORS: Age, Fitness, Lifestyle, Past/current experiences, and Time since onset of injury/illness/exacerbation, and 2-3 comorbidities are also affecting patient's functional outcome.    REHAB POTENTIAL: Fair   CLINICAL DECISION MAKING: unstable/complicated   EVALUATION COMPLEXITY: Moderate     GOALS:     SHORT TERM GOALS: Target date: 01/31/2023     Pt will become independent with HEP in order to demonstrate synthesis of PT education.   Goal status: Met 01/30/23   2. Pt will be able to demonstrate full hip/knee AROM in order to demonstrate functional improvement in LE function for self-care and house hold duties.      Goal status: INITIAL   3.  Pt will report at least 2 pt reduction on NPRS scale for pain in order to demonstrate functional improvement with household activity, self care, and ADL.    Goal status:MET - 01/23/23   LONG TERM GOALS: Target date: 03/14/2023       Pt  will become independent with final HEP in  order to demonstrate synthesis of PT education.   Goal status: INITIAL   2.  Pt will have an at least 18 pt improvement in LEFS measure in order to demonstrate MCID improvement in daily function.    Goal status: INITIAL   3.  Pt will be able to lift/squat/hold >10 lbs in order to demonstrate functional improvement in lumbopelvic strength for return to ADL and household activity.    Goal status: INITIAL   4.  Pt will be able to demonstrate reciprocal stair stepping with ascent and descent with single UE in order to demonstrate functional improvement in LE function for self-care and community ambulation.   Goal status: INITIAL   5. Pt will be able to demonstrate/report ability to walk >20 mins in order to demonstrate functional improvement and tolerance to exercise and community mobility.   Goal status: INITIAL     PLAN:   PT FREQUENCY: 1-2x/week   PT DURATION: 12 weeks (plan to D/C in 8-10 wks)   PLANNED INTERVENTIONS: Therapeutic exercises, Therapeutic activity, Neuromuscular re-education, Balance training, Gait training, Patient/Family education, Self Care, Joint mobilization, Joint manipulation, Stair training, Aquatic Therapy, Dry Needling, Electrical stimulation, Spinal manipulation, Spinal mobilization, Cryotherapy, Moist heat, scar mobilization, Splintting, Taping, Vasopneumatic device, Traction, Ultrasound, Ionotophoresis 4mg /ml Dexamethasone, Manual therapy, and Re-evaluation   PLAN FOR NEXT SESSION: when able, cont aquatics: general conditioning, LE strength and exercise   Corrie Dandy Tomma Lightning) Summerlynn Glauser MPT 01/30/23 3:30 PM [facility] [address] [place], [place], [address] Phone: [phone]   Fax:  [phone]   Referring diagnosis? M62.81, r26.2 Treatment diagnosis? (if different than referring diagnosis)  S82.831D (ICD-10-CM) - Closed fracture of distal end of right fibula with routine healing, unspecified  fracture morphology, subsequent encounter   What was this (referring dx) caused by? []  Surgery [x]  Fall [x]  Ongoing issue []  Arthritis []  Other: ____________  Laterality: []  Rt []  Lt [x]  Both  Check all possible  CPT codes:      []  97110 (Therapeutic Exercise)  []  92507 (SLP Treatment)  []  97112 (Neuro Re-ed)   []  09811 (Swallowing Treatment)   []  97116 (Gait Training)   []  [phone] (Cognitive Training, 1st 15 minutes) []  97140 (Manual Therapy)   []  97130 (Cognitive Training, each add'l 15 minutes)  []  97530 (Therapeutic Activities)  []  Other, List CPT Code ____________    []  97535 (Self Care)       [x]  All codes above (97110 - 97535)  [x]  97012 (Mechanical Traction)  [x]  97014 (E-stim Unattended)  [x]  97032 (E-stim manual)  [x]  97033 (Ionto)  [x]  97035 (Ultrasound)  []  97760 (Orthotic Fit) []  [id number] (Physical Performance Training) [x]  [id number] (Aquatic Therapy) []  [id number] (Contrast Bath) []  [id number] (Paraffin) []  97597 (Wound Care 1st 20 sq cm) []  97598 (Wound Care each add'l 20 sq cm) []  97016 (Vasopneumatic Device) []  [phone] Public affairs consultant) []  [id number] (Prosthetic Training)

## 2023-02-01 ENCOUNTER — Ambulatory Visit (HOSPITAL_BASED_OUTPATIENT_CLINIC_OR_DEPARTMENT_OTHER): Payer: Medicare HMO | Admitting: Physical Therapy

## 2023-02-01 NOTE — Progress Notes (Signed)
Gynecologic Oncology Return Clinic Visit  02/02/23  Reason for Visit: surveillance in the setting of endometrial cancer   Treatment History: Oncology History Overview Note  Endometrioid adenocarcinoma   History of endometrial cancer  10/27/2021 Pathology Results   FINAL MICROSCOPIC DIAGNOSIS:   A. ENDOMETRIUM, BIOPSY:  - Endometrioid adenocarcinoma with focal secretory features.  - See comment.   COMMENT:  The carcinoma has endometrioid features with a few small foci with secretory features.  There are several foci of solid pattern and the findings are consistent with FIGO grade 1/2.  Immunohistochemistry shows diffuse strong positivity estrogen receptor and Napsin A is negative.  P53 shows wild-type pattern.    11/07/2021 Initial Diagnosis   Endometrial cancer (HCC)   11/14/2021 Imaging   MRI pelvis 1. Thickened (18 mm) endometrium, compatible with known endometrial malignancy. Evidence of full-thickness myometrial invasion by endometrial tumor throughout the left uterine body extending over 180 degrees of involvement. Probable early small focus of direct tumor invasion into the parametrial soft tissues in the anterior left uterine body as detailed. 2. Suspect a combination of direct endometrial tumor involvement of the upper endocervical canal and blood products within the endocervix. 3. No pelvic lymphadenopathy.  No adnexal masses.       11/24/2021 Imaging   CT abdomen and pelvis No evidence of abdominal metastatic disease or other acute findings.   Mild hepatic steatosis.   Colonic diverticulosis, without radiographic evidence of diverticulitis.   Aortic Atherosclerosis (ICD10-I70.0).     12/02/2021 Cancer Staging   Staging form: Corpus Uteri - Carcinoma and Carcinosarcoma, AJCC 8th Edition - Clinical stage from 12/02/2021: FIGO Stage IIIB (cT3b, cN0, cM0) - Signed by Artis Delay, MD on 12/02/2021 Stage prefix: Initial diagnosis   12/07/2021 Procedure   Status post  placement of right IJ port catheter   12/13/2021 - 01/10/2022 Chemotherapy   Patient is on Treatment Plan : Uterine Cisplatin days 1 and 29 + XRT      12/14/2021 - 02/01/2022 Radiation Therapy   Radiation Treatment Dates: 12/14/2021 through 02/01/2022 (IMRT : 12/14/21 through 01/17/22) (Brachytherapy - Tandem Ring : 02/01/22)  Site Technique Total Dose (Gy) Dose per Fx (Gy) Completed Fx Beam Energies  Uterus: Uterus IMRT 45/45 1.8 25/25 10X  Uterus: Uterus_Bst HDR-brachy 5.5/5.5 5.5 1/1 Ir-192        01/25/2022 Imaging   MRI pelvis Decreased endometrial thickness, consistent with interval response to therapy. Persistent deep myometrial invasion (> 50%) noted in the left lateral uterine corpus. No evidence of extra-uterine tumor extension.    No evidence of pelvic metastatic disease.   Sigmoid diverticulosis, without evidence of diverticulitis.   03/21/2022 Pathology Results   FINAL MICROSCOPIC DIAGNOSIS:   A. 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, FIGO 1  Focal residual atypical hyperplasia with squamous morular metaplasia  Tumor invades greater than 50% of the myometrium (9.75 mm of 16 mm)  (ypT1b)  Margins free  Extensive adenomyosis  Background inactive endometrium  Serosal endosalpingosis  Acute and chronic cervicitis with mucosal necrosis and numerous  nabothian cysts and tunnel clusters  Benign fallopian tubes and ovaries   ONCOLOGY TABLE:   UTERUS, CARCINOMA OR CARCINOSARCOMA: Resection   Procedure: Total hysterectomy and bilateral salpingo-oophorectomy  Histologic Type: Endometrioid adenocarcinoma  Histologic Grade: Well-differentiated, FIGO 1  Myometrial Invasion:       Depth of Myometrial Invasion (mm): 9.75 mm       Myometrial Thickness (mm):  16 mm       Percentage of Myometrial Invasion: 61%  Uterine Serosa Involvement: Not  identified  Cervical stromal Involvement: Not identified  Extent of involvement of other tissue/organs: Not identified  Peritoneal/Ascitic Fluid: Not available  Lymphovascular Invasion: Not identified  Regional Lymph Nodes: Not applicable (no lymph nodes submitted or found)   Distant Metastasis: Not applicable  Pathologic Stage Classification (pTNM, AJCC 8th Edition): ypT1b, pN n/a  Ancillary Studies: MMR / MSI has been ordered and will be reported in an  addendum  Representative Tumor Block: A7  Comment(s): The anterior endomyometrium shows extensive adenomyosis and scattered intramyometrial psammomatous calcifications.  Residual invasive tumor is identified within the posterior endomyometrium.  The otherwise benign endometrial and cervical epithelium shows apparent reactive/therapy associated atypia.  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 Obesity made retroperitoneal visualization limited and increased the complexity of the case and necessitated additional instrumentation for retraction. Obesity related complexity increased the duration of the procedure by 45 minutes.    Surgeon: Eugene Garnet MD    Operative Findings:  On EUA, 10 cm mobile uterus. On intra-abdominal entry, normal upper abdominal survey.  Omentum densely adherent to anterior abdominal wall along prior midline infraumbilical incision.  Normal-appearing small and large bowel.  Difficult intra-abdominal adiposity.  Cecum somewhat adherent to the right abdominal wall secondary to prior appendectomy.  Uterus approximately  8-10 cm and somewhat bulbous.  Normal-appearing bilateral adnexa.  No obvious adenopathy.  Some edema of the retroperitoneum.  Densely adherent bladder to the uterus and cervix from prior cesarean sections.  No gross evidence of disease. On cystoscopy, intact bladder dome. Good efflux from bilateral ureteral orifices.   04/05/2022 Procedure   Removal of implanted Port-A-Cath utilizing sharp and blunt dissection. The procedure was uncomplicated.   09/04/2022 Imaging   CT A/P: No acute abdominal or pelvic pathology. No evidence abdominal or pelvic metastatic disease.     Interval History: Doing well.  Denies any vaginal leading or discharge.  Denies any abdominal or pelvic pain.  Continues to have with urinary incontinence, unchanged.  Denies any bowel symptoms.  Is having cataract surgery on both eyes over the next month and a half.  Is out of the boot after her fibula fracture, working on PT.  Past Medical/Surgical History: Past Medical History:  Diagnosis Date   Abnormal vaginal bleeding in postmenopausal patient 10/11/2021   Acute systolic heart failure (HCC)    Allergy    Asthma    Bilateral shoulder pain    Borderline hypertension    Bronchitis    CAD (coronary artery disease)    Chronic systolic CHF (congestive heart failure) (HCC)    Chronic systolic congestive heart failure, NYHA class 3 (HCC) 07/24/2018   Diabetes mellitus    Diagnosed in 2000, on insulin, Trulicity and metformin, not checking cgs at home   Drug-induced hypotension 12/20/2021   Gangrenous appendicitis with perforation s/p lap appendectomy 10/04/2017 10/04/2017   GERD (gastroesophageal reflux disease)    History of MI (myocardial infarction)    History of radiation therapy    Uterus- 12/14/21-02/01/22- Dr. Antony Blackbird   Hyperlipidemia    Hypertension    Hypothyroidism  MGUS (monoclonal gammopathy of unknown significance)    Mitral regurgitation    Myocardial infarction (HCC)    Neuropathy    Obesity     Sleep apnea    uses CPAP   Statin intolerance    Uterine cancer [facility])     Past Surgical History:  Procedure Laterality Date   BREATH TEK H PYLORI  09/18/2011   Procedure: BREATH TEK H PYLORI;  Surgeon: Kandis Cocking, MD;  Location: [facility];  Service: General;  Laterality: N/A;   CESAREAN SECTION     x 3   IR IMAGING GUIDED PORT INSERTION  12/07/2021   IR REMOVAL TUN ACCESS W/ PORT W/O FL MOD SED  04/04/2022   LAPAROSCOPIC APPENDECTOMY N/A 10/03/2017   Procedure: APPENDECTOMY LAPAROSCOPIC;  Surgeon: Romie Levee, MD;  Location: [facility];  Service: General;  Laterality: N/A;   OPERATIVE ULTRASOUND N/A 02/01/2022   Procedure: OPERATIVE ULTRASOUND;  Surgeon: Antony Blackbird, MD;  Location: [facility];  Service: Urology;  Laterality: N/A;   RIGHT/LEFT HEART CATH AND CORONARY ANGIOGRAPHY N/A 02/12/2018   Procedure: RIGHT/LEFT HEART CATH AND CORONARY ANGIOGRAPHY;  Surgeon: Corky Crafts, MD;  Location: [facility];  Service: Cardiovascular;  Laterality: N/A;   ROBOTIC ASSISTED TOTAL HYSTERECTOMY WITH BILATERAL SALPINGO OOPHERECTOMY Bilateral 03/21/2022   Procedure: XI ROBOTIC ASSISTED TOTAL HYSTERECTOMY WITH BILATERAL SALPINGO OOPHORECTOMY, LYSIS OF ADHESIONS, CYSTOSCOPY;  Surgeon: Carver Fila, MD;  Location: [facility];  Service: Gynecology;  Laterality: Bilateral;   TANDEM RING INSERTION N/A 02/01/2022   Procedure: TANDEM RING INSERTION;  Surgeon: Antony Blackbird, MD;  Location: [facility];  Service: Urology;  Laterality: N/A;   TUBAL LIGATION      Family History  Problem Relation Age of Onset   Alcohol abuse Mother    Arthritis Mother    Diabetes Mother    Sleep apnea Mother    Anxiety disorder Mother    Eating disorder Mother    Obesity Mother    Cancer Father    Alcohol abuse Father    Heart disease Father    Hypertension Father    Stroke Father    Sleep apnea Sister    Breast cancer Neg Hx    Colon cancer Neg Hx    Ovarian cancer Neg Hx    Endometrial cancer Neg Hx     Pancreatic cancer Neg Hx    Prostate cancer Neg Hx     Social History   Socioeconomic History   Marital status: Married    Spouse name: Not on file   Number of children: Not on file   Years of education: Not on file   Highest education level: Not on file  Occupational History   Occupation: retired Sales promotion account executive, worked at [employer]  Tobacco Use   Smoking status: Former    Packs/day: 1.00    Years: 15.00    Additional pack years: 0.00    Total pack years: 15.00    Types: Cigarettes    Quit date: 01/28/1990    Years since quitting: 33.0   Smokeless tobacco: Never  Vaping Use   Vaping Use: Never used  Substance and Sexual Activity   Alcohol use: Not Currently   Drug use: No   Sexual activity: Not Currently    Birth control/protection: Post-menopausal, Abstinence  Other Topics Concern   Not on file  Social History Narrative   Lives in [place]   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 wheezing, Disp: 18 g, Rfl: 2   aspirin 81 MG chewable tablet, Chew 1 tablet (81 mg total) by mouth daily., Disp: 90 tablet, Rfl: 3   blood glucose meter kit and supplies, Use up to four times daily as directed. (FOR ICD-10 E10.9, E11.9)., Disp: 1 each, Rfl: 99   carvedilol (COREG) 6.25 MG tablet, Take 1 tablet (6.25 mg total) by mouth 2 (two) times daily., Disp: 180 tablet, Rfl: 1   diphenhydrAMINE (BENADRYL) 25 MG tablet, Take 25 mg by mouth at bedtime., Disp: , Rfl:    Evolocumab  (REPATHA SURECLICK) 140 MG/ML SOAJ, Inject 140 mg into the skin every 14 (fourteen) days., Disp: 6 mL, Rfl: 3   furosemide (LASIX) 20 MG tablet, Take 1 tablet (20 mg total) by mouth daily., Disp: 90 tablet, Rfl: 3   glucose blood test strip, Use as directed to test blood sugar, Disp: 100 each, Rfl: PRN   insulin glargine (LANTUS) 100 UNIT/ML Solostar Pen, Inject 25-40 Units into the skin daily., Disp: 45 mL, Rfl: 3   ketorolac (ACULAR) 0.5 % ophthalmic solution, Place 1 drop into the left eye 4 (four) times daily as directed, Disp: 5 mL, Rfl: 1   levothyroxine (SYNTHROID) 50 MCG tablet, Take 1 tablet (50 mcg total) by mouth daily before a meal, Disp: 90 tablet, Rfl: 3   magnesium oxide (MAG-OX) 400 (240 Mg) MG tablet, Take 1 tablet (400 mg total) by mouth daily., Disp: 30 tablet, Rfl: 0   metFORMIN (GLUCOPHAGE) 1000 MG tablet, Take 1 tablet (1,000 mg total) by mouth 2 (two) times daily with a meal., Disp: 180 tablet, Rfl: 3   moxifloxacin (VIGAMOX) 0.5 % ophthalmic solution, Place 1 drop into the left eye 4 (four) times daily as directed, Disp: 3 mL, Rfl: 1   Multiple Vitamin (MULITIVITAMIN WITH MINERALS) TABS, Take 1 tablet by mouth daily with breakfast., Disp: , Rfl:    NON FORMULARY, Pt uses a cpap nightly, Disp: , Rfl:    OneTouch Delica Lancets 33G MISC, use as directed, Disp: 100 each, Rfl: 1   prednisoLONE acetate (PRED FORTE) 1 % ophthalmic suspension, Place 1 drop into the left eye 4 (four) times daily as directed Please shake well before each use!!, Disp: 5 mL, Rfl: 1   sacubitril-valsartan (ENTRESTO) 24-26 MG, Take 1 tablet by mouth 2 (two) times daily., Disp: 180 tablet, Rfl: 3   Semaglutide,0.25 or 0.5MG /DOS, (OZEMPIC, 0.25 OR 0.5 MG/DOSE,) 2 MG/3ML SOPN, Inject 0.25mg  into the abdomen or thigh once a week for 4 weeks, then increase to 0.5mg  once a week for 6 weeks, Disp: 6 mL, Rfl: 0   senna-docusate (SENOKOT-S) 8.6-50 MG tablet, Take 2 tablets by mouth at bedtime. For AFTER surgery, do  not take if having diarrhea, Disp: 30 tablet, Rfl: 0   spironolactone (ALDACTONE) 25 MG tablet, Take 1 tablet (25 mg total) by mouth daily., Disp: 90 tablet, Rfl: 3   triamcinolone ointment (KENALOG) 0.1 %, Apply  1 application topically two times daily to affected area(s) as needed, sparing use to avoid whitening/thinning skin, Disp: 30 g, Rfl: 1  Review of Systems: + Vision problems, incontinence Denies appetite changes, fevers, chills, fatigue, unexplained weight changes. Denies hearing loss, neck lumps or masses, mouth sores, ringing in ears or voice changes. Denies cough or wheezing.  Denies shortness of breath. Denies chest pain or palpitations. Denies leg swelling. Denies abdominal distention, pain, blood in stools, constipation, diarrhea, nausea, vomiting, or early satiety. Denies pain with intercourse, dysuria, frequency, hematuria. Denies hot flashes, pelvic pain, vaginal bleeding or vaginal discharge.   Denies joint pain, back pain or muscle pain/cramps. Denies itching, rash, or wounds. Denies dizziness, headaches, numbness or seizures. Denies swollen lymph nodes or glands, denies easy bruising or bleeding. Denies anxiety, depression, confusion, or decreased concentration.  Physical Exam: BP 111/63 (BP Location: Left Arm, Patient Position: Sitting)   Pulse 97   Temp (!) 95.6 F (35.3 C) (Axillary)   Resp 16   Ht 5' 7.32" (1.71 m)   Wt 294 lb (133.4 kg)   SpO2 100%   BMI 45.61 kg/m  General: Alert, oriented, no acute distress. HEENT: Normocephalic, atraumatic, sclera anicteric. Chest: Clear to auscultation bilaterally.  No wheezes or rhonchi.  Keloid scar over her prior port site. Cardiovascular: Regular rate and rhythm, no murmurs. Abdomen: Obese, soft, nontender.  Normoactive bowel sounds.  No masses or hepatosplenomegaly appreciated.  Well-healed incisions. Extremities: Grossly normal range of motion.  Warm, well perfused.  Trace edema bilaterally. Lymphatics: No  cervical, supraclavicular, or inguinal adenopathy. GU: Normal appearing external genitalia without erythema, excoriation, or lesions.  Speculum exam reveals moderately atrophic vaginal mucosa, vaginal apex with some radiation changes.  No masses or atypical vascularity.  Bimanual exam reveals cuff intact, no nodularity or firmness.  Rectovaginal exam confirms findings.  Laboratory & Radiologic Studies: 08/2022:  CT A/P No acute abdominal or pelvic pathology. No evidence abdominal or pelvic metastatic disease. Chronic posttraumatic deformity noted of the right ischium and superior pubic ramus.  Assessment & Plan: Terri Heath is a 67 y.o. 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 Surgery 02/2022. p53 wildtype. 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  MSI high.   Patient is doing well and continues to be NED on exam today.    Per NCCN surveillance recommendations, we will plan on surveillance visits every 3 months for the first 2-3 years.  We discussed signs and symptoms that would be concerning for cancer recurrence, and I stressed the importance of calling if she develops any of these.  20 minutes of total time was spent for this patient encounter, including preparation, face-to-face counseling with the patient and coordination of care, and documentation of the encounter.  Eugene Garnet, MD  Division of Gynecologic Oncology  Department of Obstetrics and Gynecology  [facility]

## 2023-02-02 ENCOUNTER — Other Ambulatory Visit: Payer: Self-pay

## 2023-02-02 ENCOUNTER — Inpatient Hospital Stay: Payer: Medicare HMO | Attending: Gynecologic Oncology | Admitting: Gynecologic Oncology

## 2023-02-02 ENCOUNTER — Encounter: Payer: Self-pay | Admitting: Gynecologic Oncology

## 2023-02-02 VITALS — BP 111/63 | HR 97 | Temp 95.6°F | Resp 16 | Ht 67.32 in | Wt 294.0 lb

## 2023-02-02 DIAGNOSIS — Z90710 Acquired absence of both cervix and uterus: Secondary | ICD-10-CM | POA: Insufficient documentation

## 2023-02-02 DIAGNOSIS — C541 Malignant neoplasm of endometrium: Secondary | ICD-10-CM | POA: Diagnosis not present

## 2023-02-02 DIAGNOSIS — Z923 Personal history of irradiation: Secondary | ICD-10-CM | POA: Diagnosis not present

## 2023-02-02 DIAGNOSIS — Z9221 Personal history of antineoplastic chemotherapy: Secondary | ICD-10-CM | POA: Diagnosis not present

## 2023-02-02 DIAGNOSIS — Z8542 Personal history of malignant neoplasm of other parts of uterus: Secondary | ICD-10-CM | POA: Insufficient documentation

## 2023-02-02 NOTE — Patient Instructions (Signed)
It was good to see you today.  I do not see or feel any evidence of cancer recurrence on your exam.  I will see you for follow-up in 3 months.  As always, if you develop any new and concerning symptoms before your next visit, please call to see me sooner.  

## 2023-02-06 ENCOUNTER — Encounter (HOSPITAL_BASED_OUTPATIENT_CLINIC_OR_DEPARTMENT_OTHER): Payer: Self-pay | Admitting: Physical Therapy

## 2023-02-06 ENCOUNTER — Ambulatory Visit (HOSPITAL_BASED_OUTPATIENT_CLINIC_OR_DEPARTMENT_OTHER): Payer: Medicare HMO | Admitting: Physical Therapy

## 2023-02-06 ENCOUNTER — Ambulatory Visit (INDEPENDENT_AMBULATORY_CARE_PROVIDER_SITE_OTHER): Payer: Medicare HMO

## 2023-02-06 VITALS — Ht 67.0 in | Wt 295.0 lb

## 2023-02-06 DIAGNOSIS — M6281 Muscle weakness (generalized): Secondary | ICD-10-CM | POA: Diagnosis not present

## 2023-02-06 DIAGNOSIS — R262 Difficulty in walking, not elsewhere classified: Secondary | ICD-10-CM

## 2023-02-06 DIAGNOSIS — Z0000 Encounter for general adult medical examination without abnormal findings: Secondary | ICD-10-CM | POA: Diagnosis not present

## 2023-02-06 DIAGNOSIS — Z1211 Encounter for screening for malignant neoplasm of colon: Secondary | ICD-10-CM

## 2023-02-06 NOTE — Therapy (Signed)
OUTPATIENT PHYSICAL THERAPY LOWER EXTREMITY TREATMENT   Patient Name: Terri Heath MRN: [id number] DOB:1956-07-28, 67 y.o., female Today's Date: 02/06/2023  END OF SESSION:  PT End of Session - 02/06/23 1408     Visit Number 8    Number of Visits 19    Date for PT Re-Evaluation 03/20/23    Authorization Type [employer] MCR    PT Start Time 1400    PT Stop Time 1440    PT Time Calculation (min) 40 min    Activity Tolerance Patient tolerated treatment well    Behavior During Therapy WFL for tasks assessed/performed             Past Medical History:  Diagnosis Date   Abnormal vaginal bleeding in postmenopausal patient 10/11/2021   Acute systolic heart failure (HCC)    Allergy    Asthma    Bilateral shoulder pain    Borderline hypertension    Bronchitis    CAD (coronary artery disease)    Chronic systolic CHF (congestive heart failure) (HCC)    Chronic systolic congestive heart failure, NYHA class 3 (HCC) 07/24/2018   Diabetes mellitus    Diagnosed in 2000, on insulin, Trulicity and metformin, not checking cgs at home   Drug-induced hypotension 12/20/2021   Gangrenous appendicitis with perforation s/p lap appendectomy 10/04/2017 10/04/2017   GERD (gastroesophageal reflux disease)    History of MI (myocardial infarction)    History of radiation therapy    Uterus- 12/14/21-02/01/22- Dr. Antony Blackbird   Hyperlipidemia    Hypertension    Hypothyroidism    MGUS (monoclonal gammopathy of unknown significance)    Mitral regurgitation    Myocardial infarction (HCC)    Neuropathy    Obesity    Sleep apnea    uses CPAP   Statin intolerance    Uterine cancer [facility])    Past Surgical History:  Procedure Laterality Date   BREATH TEK H PYLORI  09/18/2011   Procedure: BREATH TEK H PYLORI;  Surgeon: Kandis Cocking, MD;  Location: [facility];  Service: General;  Laterality: N/A;   CESAREAN SECTION     x 3   IR IMAGING GUIDED PORT INSERTION  12/07/2021   IR REMOVAL TUN ACCESS  W/ PORT W/O FL MOD SED  04/04/2022   LAPAROSCOPIC APPENDECTOMY N/A 10/03/2017   Procedure: APPENDECTOMY LAPAROSCOPIC;  Surgeon: Romie Levee, MD;  Location: [facility];  Service: General;  Laterality: N/A;   OPERATIVE ULTRASOUND N/A 02/01/2022   Procedure: OPERATIVE ULTRASOUND;  Surgeon: Antony Blackbird, MD;  Location: [facility];  Service: Urology;  Laterality: N/A;   RIGHT/LEFT HEART CATH AND CORONARY ANGIOGRAPHY N/A 02/12/2018   Procedure: RIGHT/LEFT HEART CATH AND CORONARY ANGIOGRAPHY;  Surgeon: Corky Crafts, MD;  [facility] INVASIVE CV LAB;  Service: Cardiovascular;  Laterality: N/A;   ROBOTIC ASSISTED TOTAL HYSTERECTOMY WITH BILATERAL SALPINGO OOPHERECTOMY Bilateral 03/21/2022   Procedure: XI ROBOTIC ASSISTED TOTAL HYSTERECTOMY WITH BILATERAL SALPINGO OOPHORECTOMY, LYSIS OF ADHESIONS, CYSTOSCOPY;  Surgeon: Carver Fila, MD;  Location: [facility];  Service: Gynecology;  Laterality: Bilateral;   TANDEM RING INSERTION N/A 02/01/2022   Procedure: TANDEM RING INSERTION;  Surgeon: Antony Blackbird, MD;  Location: [facility];  Service: Urology;  Laterality: N/A;   TUBAL LIGATION     Patient Active Problem List   Diagnosis Date Noted   Closed fracture of distal fibula 07/17/2022   Hypomagnesemia 01/17/2022   Pancytopenia, acquired (HCC) 01/03/2022   Dehydration 12/20/2021   Elevated serum creatinine 12/20/2021   Diabetic  peripheral neuropathy associated with type 2 diabetes mellitus (HCC) 12/02/2021   History of endometrial cancer 11/07/2021   Encounter to establish care 01/06/2021   Vitamin D deficiency 01/06/2021   Rash of back 01/06/2021   Claudication in peripheral vascular disease (HCC) 09/16/2020   Chronic systolic CHF (congestive heart failure), NYHA class 3 (HCC) 07/24/2018   Acquired hypothyroidism 07/24/2018   Cardiomyopathy, ischemic 07/24/2018   MGUS (monoclonal gammopathy of unknown significance) 03/06/2018   Dyspnea 01/14/2018   Allergic rhinitis 06/11/2014   Asthma due to seasonal  allergies 10/24/2012   OSA (obstructive sleep apnea) 05/23/2011   Poorly controlled type 2 diabetes mellitus with circulatory disorder (HCC) 03/26/2007   Hyperlipidemia associated with type 2 diabetes mellitus (HCC) 03/26/2007   Obesity, Class III, BMI 40-49.9 (morbid obesity) (HCC) 03/26/2007   Hypertension associated with type 2 diabetes mellitus (HCC) 03/26/2007    PCP: Tollie Eth, NP   REFERRING PROVIDER: de [place], Raymond J, MD   REFERRING DIAG:  S82.831D (ICD-10-CM) - Closed fracture of distal end of right fibula with routine healing, unspecified fracture morphology, subsequent encounter      THERAPY DIAG:  Muscle weakness (generalized)  Difficulty walking  Rationale for Evaluation and Treatment: Rehabilitation  ONSET DATE: 07/10/2022 Initial Fall  SUBJECTIVE:   SUBJECTIVE STATEMENT: Pt states she still has to stop every ~15 steps to catch her breath with walking, but notes improvement.     PERTINENT HISTORY: CHF, DM neuropathy (sock distribution), CAD, previous uterine cancer PAIN:  Are you having pain? no : NPRS scale: 0/10 Location:  Lt knee Description:  Aggravating: night time Relieving:  ? Medication    PRECAUTIONS: Adhesive Allergy*  WEIGHT BEARING RESTRICTIONS: No  FALLS:  Has patient fallen in last 6 months? Yes. Number of falls 2, caught foot on an ottoman   LIVING ENVIRONMENT: Lives with: lives with their family and lives with their spouse Lives in: House/apartment Stairs: Yes, modified rise  Has following equipment at home: Single point cane, rails in bathroom   OCCUPATION: previously [employer], EKG   PLOF: Independent with basic ADLs  PATIENT GOALS: return to normal ADL, walk without a cane, "feel stronger":    OBJECTIVE:   DIAGNOSTIC FINDINGS:   FINDINGS: Similar-appearing mildly displaced distal fibular fracture. No new periosteal reaction identified. There is no evidence of new acute fracture, dislocation, or joint  effusion. There is no evidence of arthropathy or other focal bone abnormality. Soft tissues are unremarkable.   IMPRESSION: Similar-appearing mildly displaced distal fibular fracture. No new periosteal reaction identified.    PATIENT SURVEYS:  Lower Extremity Functional Score: 16 / 80 = 20.0 %  COGNITION: Overall cognitive status: Within functional limits for tasks assessed     SENSATION: Light touch: Impaired   POSTURE: rounded shoulders, increased thoracic kyphosis, anterior pelvic tilt, and flexed trunk    LOWER EXTREMITY ROM:  AROM of bilat hips limited by proximal LE weakness and body habitus; L LE AROM at the hip most limited with flexion and ER position  LOWER EXTREMITY MMT:  MMT Right eval Left eval  Hip flexion 4/5 4/5  Hip extension 4/5 4/5  Hip abduction 4/5 4/5  Hip adduction    Hip internal rotation    Hip external rotation 4/5 4/5  Knee flexion 4/5 4/5  Knee extension 4/5 4/5   (Blank rows = not tested)    FUNCTIONAL TESTS:  5 times sit to stand: 28.7s  TUG (modified): measured 65ft from treatment room door 16.5s  Unable  to perform tandem or SLS  GAIT: Distance walked: 54ft Assistive device utilized: Single point cane Level of assistance: Modified independence Comments: bilat compensated [place], decreased L step length, APT   TODAY'S TREATMENT:                                                                                                                              DATE: 02/06/23  Pt seen for aquatic therapy today.  Treatment took place in water 3.5-4.75 ft in depth at the [facility] pool. Temp of water was 91.  Pt entered/exited the pool via stairs independently with bilat rail. * unsupported: walking forward/ backwards x 4 laps * side stepping with rainbow hand floats and arm addct x 3 laps * holding hand floats under water (rainbow)- walking forward / backward  * TrA set with solid noodle pull down to thighs with 2  isometric pauses on return to surface x 10 * kickboard row in staggered stance x 10 each LE forward, then repeated at 10+2 o'clock * return to walking forward/ backward for recovery * holding noodle:  heel raises x 15, leg swings into hip flex/extension 3x5 ;  hip add/abd 5x5 * return to walking forward for recovery * tandem stance x 20s no UE support; tandem gait forward/backward with cues for technique and to slow speed *Cycling on noodle: hip add/abd; skiing * hip hinge with UE reach, on yellow noodle x 10   Pt requires the buoyancy and hydrostatic pressure of water for support, and to offload joints by unweighting joint load by at least 50 % in navel deep water and by at least 75-80% in chest to neck deep water.  Viscosity of the water is needed for resistance of strengthening. Water current perturbations provides challenge to standing balance requiring increased core activation.  PATIENT EDUCATION:  Education details: aquatic therapy exercise progressions/modifications  Person educated: Patient Education method: Explanation, Demonstration, Tactile cues, Verbal cues,  Education comprehension: verbalized understanding, returned demonstration, verbal cues required, and tactile cues required  HOME EXERCISE PROGRAM:  Access Code: [email or web address] URL: [email or web address] Date: 12/20/2022 Prepared by: Zebedee Iba  AQUATIC Access Code: [email or web address] URL: [email or web address] Date: 02/06/2023 Prepared by: [facility] This aquatic home exercise program from [employer] utilizes pictures from land based exercises, but has been adapted prior to lamination and issuance.   ASSESSMENT:  CLINICAL IMPRESSION: Pt plans to join [facility] after June 20th (has first cataract surgery that day).  Issued laminated HEP today.  Pt reported fatigue at end of session, and decrease in pain. Progressing exercises gradually.  Pt is making good gains towards  goals.      OBJECTIVE IMPAIRMENTS: decreased activity tolerance, decreased balance, decreased endurance, decreased mobility, decreased ROM, decreased strength, hypomobility, increased muscle spasms, impaired flexibility, improper body mechanics, postural dysfunction, obesity, and pain.    ACTIVITY LIMITATIONS: lifting, squatting, locomotion level, and dressing   PARTICIPATION LIMITATIONS: interpersonal relationship, community activity, occupation, and  exercise   PERSONAL FACTORS: Age, Fitness, Lifestyle, Past/current experiences, and Time since onset of injury/illness/exacerbation, and 2-3 comorbidities are also affecting patient's functional outcome.    REHAB POTENTIAL: Fair   CLINICAL DECISION MAKING: unstable/complicated   EVALUATION COMPLEXITY: Moderate     GOALS:     SHORT TERM GOALS: Target date: 01/31/2023     Pt will become independent with HEP in order to demonstrate synthesis of PT education.   Goal status: Met 01/30/23   2. Pt will be able to demonstrate full hip/knee AROM in order to demonstrate functional improvement in LE function for self-care and house hold duties.      Goal status: INITIAL   3.  Pt will report at least 2 pt reduction on NPRS scale for pain in order to demonstrate functional improvement with household activity, self care, and ADL.    Goal status:MET - 01/23/23   LONG TERM GOALS: Target date: 03/14/2023       Pt  will become independent with final HEP in order to demonstrate synthesis of PT education.   Goal status: INITIAL   2.  Pt will have an at least 18 pt improvement in LEFS measure in order to demonstrate MCID improvement in daily function.    Goal status: INITIAL   3.  Pt will be able to lift/squat/hold >10 lbs in order to demonstrate functional improvement in lumbopelvic strength for return to ADL and household activity.    Goal status: INITIAL   4.  Pt will be able to demonstrate reciprocal stair stepping with ascent and descent  with single UE in order to demonstrate functional improvement in LE function for self-care and community ambulation.   Goal status: INITIAL   5. Pt will be able to demonstrate/report ability to walk >20 mins in order to demonstrate functional improvement and tolerance to exercise and community mobility.   Goal status: INITIAL     PLAN:   PT FREQUENCY: 1-2x/week   PT DURATION: 12 weeks (plan to D/C in 8-10 wks)   PLANNED INTERVENTIONS: Therapeutic exercises, Therapeutic activity, Neuromuscular re-education, Balance training, Gait training, Patient/Family education, Self Care, Joint mobilization, Joint manipulation, Stair training, Aquatic Therapy, Dry Needling, Electrical stimulation, Spinal manipulation, Spinal mobilization, Cryotherapy, Moist heat, scar mobilization, Splintting, Taping, Vasopneumatic device, Traction, Ultrasound, Ionotophoresis 4mg /ml Dexamethasone, Manual therapy, and Re-evaluation   PLAN FOR NEXT SESSION: when able, cont aquatics: general conditioning, LE strength and exercise   Mayer Camel, PTA 02/06/23 2:45 PM [facility] [address] [place], [place], [address] Phone: [phone]   Fax:  [phone]    Referring diagnosis? M62.81, r26.2 Treatment diagnosis? (if different than referring diagnosis)  S82.831D (ICD-10-CM) - Closed fracture of distal end of right fibula with routine healing, unspecified fracture morphology, subsequent encounter   What was this (referring dx) caused by? []  Surgery [x]  Fall [x]  Ongoing issue []  Arthritis []  Other: ____________  Laterality: []  Rt []  Lt [x]  Both  Check all possible CPT codes:      []  97110 (Therapeutic Exercise)  []  92507 (SLP Treatment)  []  97112 (Neuro Re-ed)   []  92526 (Swallowing Treatment)   []  97116 (Gait Training)   []  65784 (Cognitive Training, 1st 15 minutes) []  97140 (Manual Therapy)   []  97130 (Cognitive Training, each add'l  15 minutes)  []  97530 (Therapeutic Activities)  []  Other, List CPT Code ____________    []  97535 (Self Care)       [x]  All codes above (97110 - 97535)  [  x] [phone] (Mechanical Traction)  [x]  97014 (E-stim Unattended)  [x]  97032 (E-stim manual)  [x]  60454 (Ionto)  [x]  97035 (Ultrasound)  []  97760 (Orthotic Fit) []  [id number] (Physical Performance Training) [x]  [id number] (Aquatic Therapy) []  [id number] (Contrast Bath) []  [id number] (Paraffin) []  97597 (Wound Care 1st 20 sq cm) []  97598 (Wound Care each add'l 20 sq cm) []  97016 (Vasopneumatic Device) []  [phone] Public affairs consultant) []  [phone] (Prosthetic Training)

## 2023-02-06 NOTE — Progress Notes (Signed)
I connected with  Barrie Dunker on 02/06/23 by a audio enabled telemedicine application and verified that I am speaking with the correct person using two identifiers.  Patient Location: Home  Provider Location: Office/Clinic  I discussed the limitations of evaluation and management by telemedicine. The patient expressed understanding and agreed to proceed. Subjective:   Terri Heath is a 67 y.o. female who presents for an Initial Medicare Annual Wellness Visit.  Patient Medicare AWV questionnaire was completed by the patient on 02/02/2023; I have confirmed that all information answered by patient is correct and no changes since this date.    Review of Systems     Cardiac Risk Factors include: advanced age (>70men, >71 women);diabetes mellitus;dyslipidemia;hypertension;obesity (BMI >30kg/m2)     Objective:    Today's Vitals   02/06/23 0843  Weight: 295 lb (133.8 kg)  Height: 5\' 7"  (1.702 m)   Body mass index is 46.2 kg/m.     02/06/2023    8:51 AM 12/20/2022    3:39 PM 07/17/2022   10:42 AM 03/08/2022    9:19 AM 03/06/2022    9:58 AM 02/01/2022    7:42 AM 01/27/2022    8:04 AM  Advanced Directives  Does Patient Have a Medical Advance Directive? No No No No No No No  Would patient like information on creating a medical advance directive?  Yes (MAU/Ambulatory/Procedural Areas - Information given)  Yes (MAU/Ambulatory/Procedural Areas - Information given)  No - Patient declined     Current Medications (verified) Outpatient Encounter Medications as of 02/06/2023  Medication Sig   albuterol (PROAIR HFA) 108 (90 Base) MCG/ACT inhaler Inhale 2 puffs into the lungs every 6 (six) hours as needed. wheezing   aspirin 81 MG chewable tablet Chew 1 tablet (81 mg total) by mouth daily.   blood glucose meter kit and supplies Use up to four times daily as directed. (FOR ICD-10 E10.9, E11.9).   carvedilol (COREG) 6.25 MG tablet Take 1 tablet (6.25 mg total) by mouth 2 (two) times daily.    diphenhydrAMINE (BENADRYL) 25 MG tablet Take 25 mg by mouth at bedtime.   Evolocumab (REPATHA SURECLICK) 140 MG/ML SOAJ Inject 140 mg into the skin every 14 (fourteen) days.   furosemide (LASIX) 20 MG tablet Take 1 tablet (20 mg total) by mouth daily.   glucose blood test strip Use as directed to test blood sugar   insulin glargine (LANTUS) 100 UNIT/ML Solostar Pen Inject 25-40 Units into the skin daily.   levothyroxine (SYNTHROID) 50 MCG tablet Take 1 tablet (50 mcg total) by mouth daily before a meal   magnesium oxide (MAG-OX) 400 (240 Mg) MG tablet Take 1 tablet (400 mg total) by mouth daily.   metFORMIN (GLUCOPHAGE) 1000 MG tablet Take 1 tablet (1,000 mg total) by mouth 2 (two) times daily with a meal.   OneTouch Delica Lancets 33G MISC use as directed   sacubitril-valsartan (ENTRESTO) 24-26 MG Take 1 tablet by mouth 2 (two) times daily.   Semaglutide,0.25 or 0.5MG /DOS, (OZEMPIC, 0.25 OR 0.5 MG/DOSE,) 2 MG/3ML SOPN Inject 0.25mg  into the abdomen or thigh once a week for 4 weeks, then increase to 0.5mg  once a week for 6 weeks   senna-docusate (SENOKOT-S) 8.6-50 MG tablet Take 2 tablets by mouth at bedtime. For AFTER surgery, do not take if having diarrhea   spironolactone (ALDACTONE) 25 MG tablet Take 1 tablet (25 mg total) by mouth daily.   triamcinolone ointment (KENALOG) 0.1 % Apply 1 application topically two times daily to  affected area(s) as needed, sparing use to avoid whitening/thinning skin   ketorolac (ACULAR) 0.5 % ophthalmic solution Place 1 drop into the left eye 4 (four) times daily as directed (Patient not taking: Reported on 02/06/2023)   moxifloxacin (VIGAMOX) 0.5 % ophthalmic solution Place 1 drop into the left eye 4 (four) times daily as directed (Patient not taking: Reported on 02/06/2023)   Multiple Vitamin (MULITIVITAMIN WITH MINERALS) TABS Take 1 tablet by mouth daily with breakfast.   NON FORMULARY Pt uses a cpap nightly   prednisoLONE acetate (PRED FORTE) 1 % ophthalmic  suspension Place 1 drop into the left eye 4 (four) times daily as directed Please shake well before each use!! (Patient not taking: Reported on 02/06/2023)   [DISCONTINUED] insulin detemir (LEVEMIR) 100 UNIT/ML FlexPen Inject 10 Units into the skin daily.   No facility-administered encounter medications on file as of 02/06/2023.    Allergies (verified) Amoxicillin, Adhesive [tape], Exenatide, Invokana [canagliflozin], Jardiance [empagliflozin], Lisinopril, Other, and Statins   History: Past Medical History:  Diagnosis Date   Abnormal vaginal bleeding in postmenopausal patient 10/11/2021   Acute systolic heart failure (HCC)    Allergy    Asthma    Bilateral shoulder pain    Borderline hypertension    Bronchitis    CAD (coronary artery disease)    Chronic systolic CHF (congestive heart failure) (HCC)    Chronic systolic congestive heart failure, NYHA class 3 (HCC) 07/24/2018   Diabetes mellitus    Diagnosed in 2000, on insulin, Trulicity and metformin, not checking cgs at home   Drug-induced hypotension 12/20/2021   Gangrenous appendicitis with perforation s/p lap appendectomy 10/04/2017 10/04/2017   GERD (gastroesophageal reflux disease)    History of MI (myocardial infarction)    History of radiation therapy    Uterus- 12/14/21-02/01/22- Dr. Antony Blackbird   Hyperlipidemia    Hypertension    Hypothyroidism    MGUS (monoclonal gammopathy of unknown significance)    Mitral regurgitation    Myocardial infarction (HCC)    Neuropathy    Obesity    Sleep apnea    uses CPAP   Statin intolerance    Uterine cancer [facility])    Past Surgical History:  Procedure Laterality Date   BREATH TEK H PYLORI  09/18/2011   Procedure: BREATH TEK H PYLORI;  Surgeon: Kandis Cocking, MD;  Location: [facility];  Service: General;  Laterality: N/A;   CESAREAN SECTION     x 3   IR IMAGING GUIDED PORT INSERTION  12/07/2021   IR REMOVAL TUN ACCESS W/ PORT W/O FL MOD SED  04/04/2022   LAPAROSCOPIC  APPENDECTOMY N/A 10/03/2017   Procedure: APPENDECTOMY LAPAROSCOPIC;  Surgeon: Romie Levee, MD;  Location: [facility];  Service: General;  Laterality: N/A;   OPERATIVE ULTRASOUND N/A 02/01/2022   Procedure: OPERATIVE ULTRASOUND;  Surgeon: Antony Blackbird, MD;  Location: [facility];  Service: Urology;  Laterality: N/A;   RIGHT/LEFT HEART CATH AND CORONARY ANGIOGRAPHY N/A 02/12/2018   Procedure: RIGHT/LEFT HEART CATH AND CORONARY ANGIOGRAPHY;  Surgeon: Corky Crafts, MD;  Location: [facility] INVASIVE CV LAB;  Service: Cardiovascular;  Laterality: N/A;   ROBOTIC ASSISTED TOTAL HYSTERECTOMY WITH BILATERAL SALPINGO OOPHERECTOMY Bilateral 03/21/2022   Procedure: XI ROBOTIC ASSISTED TOTAL HYSTERECTOMY WITH BILATERAL SALPINGO OOPHORECTOMY, LYSIS OF ADHESIONS, CYSTOSCOPY;  Surgeon: Carver Fila, MD;  Location: [facility];  Service: Gynecology;  Laterality: Bilateral;   TANDEM RING INSERTION N/A 02/01/2022   Procedure: TANDEM RING INSERTION;  Surgeon: Antony Blackbird, MD;  Location: [facility];  Service: Urology;  Laterality: N/A;   TUBAL LIGATION     Family History  Problem Relation Age of Onset   Alcohol abuse Mother    Arthritis Mother    Diabetes Mother    Sleep apnea Mother    Anxiety disorder Mother    Eating disorder Mother    Obesity Mother    Cancer Father    Alcohol abuse Father    Heart disease Father    Hypertension Father    Stroke Father    Sleep apnea Sister    Breast cancer Neg Hx    Colon cancer Neg Hx    Ovarian cancer Neg Hx    Endometrial cancer Neg Hx    Pancreatic cancer Neg Hx    Prostate cancer Neg Hx    Social History   Socioeconomic History   Marital status: Married    Spouse name: Not on file   Number of children: Not on file   Years of education: Not on file   Highest education level: Not on file  Occupational History   Occupation: retired Sales promotion account executive, worked at [employer]  Tobacco Use   Smoking status: Former    Packs/day: 1.00    Years: 15.00    Additional pack years: 0.00     Total pack years: 15.00    Types: Cigarettes    Quit date: 01/28/1990    Years since quitting: 33.0   Smokeless tobacco: Never  Vaping Use   Vaping Use: Never used  Substance and Sexual Activity   Alcohol use: Not Currently   Drug use: No   Sexual activity: Not Currently    Birth control/protection: Post-menopausal, Abstinence  Other Topics Concern   Not on file  Social History Narrative   Lives in [place]   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   Social Determinants of Health   Financial Resource Strain: Low Risk  (02/02/2023)   Overall Financial Resource Strain (CARDIA)    Difficulty of Paying Living Expenses: Not very hard  Food Insecurity: No Food Insecurity (02/02/2023)   Hunger Vital Sign    Worried About Running Out of Food in the Last Year: Never true    Ran Out of Food in the Last Year: Never true  Transportation Needs: Unmet Transportation Needs (02/02/2023)   PRAPARE - Transportation    Lack of Transportation (Medical): Yes    Lack of Transportation (Non-Medical): Yes  Physical Activity: Insufficiently Active (02/02/2023)   Exercise Vital Sign    Days of Exercise per Week: 2 days    Minutes of Exercise per Session: 40 min  Stress: No Stress Concern Present (02/02/2023)   [employer] of Occupational Health - Occupational Stress Questionnaire    Feeling of Stress : Only a little  Social Connections: Unknown (02/02/2023)   Social Connection and Isolation Panel [NHANES]    Frequency of Communication with Friends and Family: Once a week    Frequency of Social Gatherings with Friends and Family: Once a week    Attends Religious Services: Not on Marketing executive or Organizations: Yes    Attends Banker Meetings: 1 to 4 times per year    Marital Status: Married    Tobacco Counseling Counseling given: Not Answered   Clinical Intake:  Pre-visit preparation completed: Yes  Pain : No/denies pain     Nutritional Status: BMI >  30  Obese Nutritional Risks: None Diabetes: Yes  How often  do you need to have someone help you when you read instructions, pamphlets, or other written materials from your doctor or pharmacy?: 1 - Never  Diabetic? Yes Nutrition Risk Assessment:  Has the patient had any N/V/D within the last 2 months?  No  Does the patient have any non-healing wounds?  No  Has the patient had any unintentional weight loss or weight gain?  No   Diabetes:  Is the patient diabetic?  Yes  If diabetic, was a CBG obtained today?  No  Did the patient bring in their glucometer from home?  No  How often do you monitor your CBG's? daily.   Financial Strains and Diabetes Management:  Are you having any financial strains with the device, your supplies or your medication? No .  Does the patient want to be seen by Chronic Care Management for management of their diabetes?  No  Would the patient like to be referred to a Nutritionist or for Diabetic Management?  No   Diabetic Exams:  Diabetic Eye Exam: Completed 2024 Diabetic Foot Exam: Overdue, Pt has been advised about the importance in completing this exam. Pt is scheduled for diabetic foot exam on next appointment.  Interpreter Needed?: No  Information entered by :: NAllen LPN   Activities of Daily Living    02/02/2023    2:15 PM 09/07/2022    9:56 AM  In your present state of health, do you have any difficulty performing the following activities:  Hearing? 0 0  Vision? 1 0  Comment about to have cataract surgery   Difficulty concentrating or making decisions? 0 0  Walking or climbing stairs? 1 1  Dressing or bathing? 1 0  Comment takes time   Doing errands, shopping? 1 1  Preparing Food and eating ? N   Using the Toilet? N   In the past six months, have you accidently leaked urine? Y   Do you have problems with loss of bowel control? N   Managing your Medications? N   Managing your Finances? N   Housekeeping or managing your Housekeeping? Y      Patient Care Team: Early, Sung Amabile, NP as PCP - General (Nurse Practitioner) Corky Crafts, MD as PCP - Cardiology (Cardiology) Oretha Milch, MD as Consulting Physician (Pulmonary Disease) Ovidio Kin, MD (General Surgery) Artis Delay, MD as Consulting Physician (Hematology and Oncology)  Indicate any recent Medical Services you may have received from other than [facility] providers in the past year (date may be approximate).     Assessment:   This is a routine wellness examination for Kouts.  Hearing/Vision screen Vision Screening - Comments:: Regular eye exams, [facility]  Dietary issues and exercise activities discussed: Current Exercise Habits: Home exercise routine, Type of exercise: Other - see comments (swimming), Time (Minutes): 40, Frequency (Times/Week): 2, Weekly Exercise (Minutes/Week): 80   Goals Addressed             This Visit's Progress    Patient Stated       02/06/2023, wants to increase exercise       Depression Screen    02/06/2023    8:52 AM 12/26/2022    2:07 PM 09/07/2022    9:56 AM 08/14/2022    1:28 PM 07/17/2022   10:43 AM 10/27/2021    4:16 PM 01/06/2021    1:30 PM  PHQ 2/9 Scores  PHQ - 2 Score 0 0 2 1 2  0 0  PHQ- 9 Score 0  4  6 5  1   Exception Documentation   Medical reason Medical reason       Fall Risk    02/02/2023    2:15 PM 12/26/2022    2:06 PM 09/12/2022    4:11 PM 08/14/2022    1:28 PM 07/17/2022   10:42 AM  Fall Risk   Falls in the past year? 1 1 1 1 1   Comment tripped over ottoman, weakness      Number falls in past yr: 1 1 0 0 0  Comment   07/10/22    Injury with Fall? 1 0 1 1 1   Comment broke leg  fx distal fibula    Risk for fall due to : Impaired mobility;Impaired balance/gait;Medication side effect Impaired mobility History of fall(s) History of fall(s)   Follow up Falls prevention discussed;Education provided;Falls evaluation completed Falls evaluation completed Falls evaluation completed Falls evaluation  completed Follow up appointment  Comment     reason for visit, md informed    FALL RISK PREVENTION PERTAINING TO THE HOME:  Any stairs in or around the home? Yes  If so, are there any without handrails? No  Home free of loose throw rugs in walkways, pet beds, electrical cords, etc? Yes  Adequate lighting in your home to reduce risk of falls? Yes   ASSISTIVE DEVICES UTILIZED TO PREVENT FALLS:  Life alert? No  Use of a cane, walker or w/c? Yes  Grab bars in the bathroom? Yes  Shower chair or bench in shower? Yes  Elevated toilet seat or a handicapped toilet? No   TIMED UP AND GO:  Was the test performed? No .      Cognitive Function:        02/06/2023    8:54 AM  6CIT Screen  What Year? 0 points  What month? 0 points  What time? 0 points  Count back from 20 4 points  Months in reverse 0 points  Repeat phrase 0 points  Total Score 4 points    Immunizations Immunization History  Administered Date(s) Administered   Fluad Quad(high Dose 65+) 05/29/2022   Influenza Split 05/23/2011, 05/09/2012   Influenza,inj,Quad PF,6+ Mos 05/07/2017, 06/09/2021   Influenza-Unspecified 06/23/2020   MMR 11/29/2000   PFIZER(Purple Top)SARS-COV-2 Vaccination 08/23/2019, 09/10/2019   Pneumococcal Polysaccharide-23 08/25/2005   Td 08/25/2005   Tdap 04/11/2019    TDAP status: Up to date  Flu Vaccine status: Up to date  Pneumococcal vaccine status: Up to date  Covid-19 vaccine status: Information provided on how to obtain vaccines.   Qualifies for Shingles Vaccine? Yes   Zostavax completed No   Shingrix Completed?: No.    Education has been provided regarding the importance of this vaccine. Patient has been advised to call insurance company to determine out of pocket expense if they have not yet received this vaccine. Advised may also receive vaccine at local pharmacy or Health Dept. Verbalized acceptance and understanding.  Screening Tests Health Maintenance  Topic Date Due    Medicare Annual Wellness (AWV)  Never done   Zoster Vaccines- Shingrix (1 of 2) Never done   COVID-19 Vaccine (3 - Pfizer risk series) 10/08/2019   OPHTHALMOLOGY EXAM  02/15/2021   FOOT EXAM  07/14/2021   Pneumonia Vaccine 32+ Years old (2 of 2 - PCV) 02/07/2022   Fecal DNA (Cologuard)  04/30/2022   MAMMOGRAM  07/14/2022   HEMOGLOBIN A1C  02/13/2023   INFLUENZA VACCINE  03/29/2023   Diabetic kidney evaluation - Urine ACR  08/15/2023  Diabetic kidney evaluation - eGFR measurement  11/24/2023   PAP SMEAR-Modifier  10/27/2024   DTaP/Tdap/Td (3 - Td or Tdap) 04/10/2029   DEXA SCAN  Completed   Hepatitis C Screening  Completed   HPV VACCINES  Aged Out   HIV Screening  Discontinued    Health Maintenance  Health Maintenance Due  Topic Date Due   Medicare Annual Wellness (AWV)  Never done   Zoster Vaccines- Shingrix (1 of 2) Never done   COVID-19 Vaccine (3 - Pfizer risk series) 10/08/2019   OPHTHALMOLOGY EXAM  02/15/2021   FOOT EXAM  07/14/2021   Pneumonia Vaccine 96+ Years old (2 of 2 - PCV) 02/07/2022   Fecal DNA (Cologuard)  04/30/2022   MAMMOGRAM  07/14/2022    Colorectal cancer screening: order cologuard today  Mammogram status: patient to schedule  Bone Density status: Completed 01/06/2021.   Lung Cancer Screening: (Low Dose CT Chest recommended if Age 38-80 years, 30 pack-year currently smoking OR have quit w/in 15years.) does not qualify.   Lung Cancer Screening Referral: no  Additional Screening:  Hepatitis C Screening: does qualify; Completed 04/11/2019  Vision Screening: Recommended annual ophthalmology exams for early detection of glaucoma and other disorders of the eye. Is the patient up to date with their annual eye exam?  Yes  Who is the provider or what is the name of the office in which the patient attends annual eye exams? [facility] If pt is not established with a provider, would they like to be referred to a provider to establish care? No .   Dental  Screening: Recommended annual dental exams for proper oral hygiene  Community Resource Referral / Chronic Care Management: CRR required this visit?  No   CCM required this visit?  No      Plan:     I have personally reviewed and noted the following in the patient's chart:   Medical and social history Use of alcohol, tobacco or illicit drugs  Current medications and supplements including opioid prescriptions. Patient is not currently taking opioid prescriptions. Functional ability and status Nutritional status Physical activity Advanced directives List of other physicians Hospitalizations, surgeries, and ER visits in previous 12 months Vitals Screenings to include cognitive, depression, and falls Referrals and appointments  In addition, I have reviewed and discussed with patient certain preventive protocols, quality metrics, and best practice recommendations. A written personalized care plan for preventive services as well as general preventive health recommendations were provided to patient.     Barb Merino, LPN   1/61/0960   Nurse Notes: none  Due to this being a virtual visit, the after visit summary with patients personalized plan was offered to patient via mail or my-chart.  Patient would like to access on my-chart

## 2023-02-06 NOTE — Patient Instructions (Signed)
[name] , Thank you for taking time to come for your Medicare Wellness Visit. I appreciate your ongoing commitment to your health goals. Please review the following plan we discussed and let me know if I can assist you in the future.   These are the goals we discussed:  Goals      Blood Pressure < 140/90     HEMOGLOBIN A1C < 7.0     Patient Stated     02/06/2023, wants to increase exercise        This is a list of the screening recommended for you and due dates:  Health Maintenance  Topic Date Due   Zoster (Shingles) Vaccine (1 of 2) Never done   COVID-19 Vaccine (3 - Pfizer risk series) 10/08/2019   Eye exam for diabetics  02/15/2021   Complete foot exam   07/14/2021   Pneumonia Vaccine (2 of 2 - PCV) 02/07/2022   Cologuard (Stool DNA test)  04/30/2022   Mammogram  07/14/2022   Hemoglobin A1C  02/13/2023   Flu Shot  03/29/2023   Yearly kidney health urinalysis for diabetes  08/15/2023   Yearly kidney function blood test for diabetes  11/24/2023   Medicare Annual Wellness Visit  02/06/2024   Pap Smear  10/27/2024   DTaP/Tdap/Td vaccine (3 - Td or Tdap) 04/10/2029   DEXA scan (bone density measurement)  Completed   Hepatitis C Screening  Completed   HPV Vaccine  Aged Out   HIV Screening  Discontinued    Advanced directives: Please bring a copy of your POA (Power of Attorney) and/or Living Will to your next appointment.   Conditions/risks identified: none  Next appointment: Follow up in one year for your annual wellness visit    Preventive Care 65 Years and Older, Female Preventive care refers to lifestyle choices and visits with your health care provider that can promote health and wellness. What does preventive care include? A yearly physical exam. This is also called an annual well check. Dental exams once or twice a year. Routine eye exams. Ask your health care provider how often you should have your eyes checked. Personal lifestyle choices, including: Daily care  of your teeth and gums. Regular physical activity. Eating a healthy diet. Avoiding tobacco and drug use. Limiting alcohol use. Practicing safe sex. Taking low-dose aspirin every day. Taking vitamin and mineral supplements as recommended by your health care provider. What happens during an annual well check? The services and screenings done by your health care provider during your annual well check will depend on your age, overall health, lifestyle risk factors, and family history of disease. Counseling  Your health care provider may ask you questions about your: Alcohol use. Tobacco use. Drug use. Emotional well-being. Home and relationship well-being. Sexual activity. Eating habits. History of falls. Memory and ability to understand (cognition). Work and work Astronomer. Reproductive health. Screening  You may have the following tests or measurements: Height, weight, and BMI. Blood pressure. Lipid and cholesterol levels. These may be checked every 5 years, or more frequently if you are over 72 years old. Skin check. Lung cancer screening. You may have this screening every year starting at age 24 if you have a 30-pack-year history of smoking and currently smoke or have quit within the past 15 years. Fecal occult blood test (FOBT) of the stool. You may have this test every year starting at age 69. Flexible sigmoidoscopy or colonoscopy. You may have a sigmoidoscopy every 5 years or a colonoscopy every  10 years starting at age 48. Hepatitis C blood test. Hepatitis B blood test. Sexually transmitted disease (STD) testing. Diabetes screening. This is done by checking your blood sugar (glucose) after you have not eaten for a while (fasting). You may have this done every 1-3 years. Bone density scan. This is done to screen for osteoporosis. You may have this done starting at age 23. Mammogram. This may be done every 1-2 years. Talk to your health care provider about how often you  should have regular mammograms. Talk with your health care provider about your test results, treatment options, and if necessary, the need for more tests. Vaccines  Your health care provider may recommend certain vaccines, such as: Influenza vaccine. This is recommended every year. Tetanus, diphtheria, and acellular pertussis (Tdap, Td) vaccine. You may need a Td booster every 10 years. Zoster vaccine. You may need this after age 75. Pneumococcal 13-valent conjugate (PCV13) vaccine. One dose is recommended after age 32. Pneumococcal polysaccharide (PPSV23) vaccine. One dose is recommended after age 54. Talk to your health care provider about which screenings and vaccines you need and how often you need them. This information is not intended to replace advice given to you by your health care provider. Make sure you discuss any questions you have with your health care provider. Document Released: 09/10/2015 Document Revised: 05/03/2016 Document Reviewed: 06/15/2015 Elsevier Interactive Patient Education  2017 [place] Prevention in the Home Falls can cause injuries. They can happen to people of all ages. There are many things you can do to make your home safe and to help prevent falls. What can I do on the outside of my home? Regularly fix the edges of walkways and driveways and fix any cracks. Remove anything that might make you trip as you walk through a door, such as a raised step or threshold. Trim any bushes or trees on the path to your home. Use bright outdoor lighting. Clear any walking paths of anything that might make someone trip, such as rocks or tools. Regularly check to see if handrails are loose or broken. Make sure that both sides of any steps have handrails. Any raised decks and porches should have guardrails on the edges. Have any leaves, snow, or ice cleared regularly. Use sand or salt on walking paths during winter. Clean up any spills in your garage right away.  This includes oil or grease spills. What can I do in the bathroom? Use night lights. Install grab bars by the toilet and in the tub and shower. Do not use towel bars as grab bars. Use non-skid mats or decals in the tub or shower. If you need to sit down in the shower, use a plastic, non-slip stool. Keep the floor dry. Clean up any water that spills on the floor as soon as it happens. Remove soap buildup in the tub or shower regularly. Attach bath mats securely with double-sided non-slip rug tape. Do not have throw rugs and other things on the floor that can make you trip. What can I do in the bedroom? Use night lights. Make sure that you have a light by your bed that is easy to reach. Do not use any sheets or blankets that are too big for your bed. They should not hang down onto the floor. Have a firm chair that has side arms. You can use this for support while you get dressed. Do not have throw rugs and other things on the floor that can make you  trip. What can I do in the kitchen? Clean up any spills right away. Avoid walking on wet floors. Keep items that you use a lot in easy-to-reach places. If you need to reach something above you, use a strong step stool that has a grab bar. Keep electrical cords out of the way. Do not use floor polish or wax that makes floors slippery. If you must use wax, use non-skid floor wax. Do not have throw rugs and other things on the floor that can make you trip. What can I do with my stairs? Do not leave any items on the stairs. Make sure that there are handrails on both sides of the stairs and use them. Fix handrails that are broken or loose. Make sure that handrails are as long as the stairways. Check any carpeting to make sure that it is firmly attached to the stairs. Fix any carpet that is loose or worn. Avoid having throw rugs at the top or bottom of the stairs. If you do have throw rugs, attach them to the floor with carpet tape. Make sure that  you have a light switch at the top of the stairs and the bottom of the stairs. If you do not have them, ask someone to add them for you. What else can I do to help prevent falls? Wear shoes that: Do not have high heels. Have rubber bottoms. Are comfortable and fit you well. Are closed at the toe. Do not wear sandals. If you use a stepladder: Make sure that it is fully opened. Do not climb a closed stepladder. Make sure that both sides of the stepladder are locked into place. Ask someone to hold it for you, if possible. Clearly mark and make sure that you can see: Any grab bars or handrails. First and last steps. Where the edge of each step is. Use tools that help you move around (mobility aids) if they are needed. These include: Canes. Walkers. Scooters. Crutches. Turn on the lights when you go into a dark area. Replace any light bulbs as soon as they burn out. Set up your furniture so you have a clear path. Avoid moving your furniture around. If any of your floors are uneven, fix them. If there are any pets around you, be aware of where they are. Review your medicines with your doctor. Some medicines can make you feel dizzy. This can increase your chance of falling. Ask your doctor what other things that you can do to help prevent falls. This information is not intended to replace advice given to you by your health care provider. Make sure you discuss any questions you have with your health care provider. Document Released: 06/10/2009 Document Revised: 01/20/2016 Document Reviewed: 09/18/2014 Elsevier Interactive Patient Education  2017 [employer].

## 2023-02-07 ENCOUNTER — Telehealth: Payer: Self-pay

## 2023-02-07 NOTE — Telephone Encounter (Signed)
   Telephone encounter was:  Unsuccessful.  02/07/2023 Name: Terri Heath MRN: [id number] DOB: Jan 12, 1956  Unsuccessful outbound call made today to assist with:  Transportation Needs   Outreach Attempt:  1st Attempt  A HIPAA compliant voice message was left requesting a return call.  Instructed patient to call back at [phone].  Tamica Covell [facility]  [facility] Population Health Community Resource Care Guide   [email or web address]  ?? [phone]   Website: [email or web address]

## 2023-02-08 ENCOUNTER — Telehealth: Payer: Self-pay

## 2023-02-08 ENCOUNTER — Ambulatory Visit (HOSPITAL_BASED_OUTPATIENT_CLINIC_OR_DEPARTMENT_OTHER): Payer: Medicare HMO | Admitting: Physical Therapy

## 2023-02-08 DIAGNOSIS — R262 Difficulty in walking, not elsewhere classified: Secondary | ICD-10-CM | POA: Diagnosis not present

## 2023-02-08 DIAGNOSIS — M6281 Muscle weakness (generalized): Secondary | ICD-10-CM

## 2023-02-08 NOTE — Therapy (Signed)
OUTPATIENT PHYSICAL THERAPY LOWER EXTREMITY TREATMENT   Patient Name: Terri Heath MRN: [id number] DOB:1956-08-08, 67 y.o., female Today's Date: 02/08/2023  END OF SESSION:  PT End of Session - 02/08/23 1531     Visit Number 9    Number of Visits 19    Date for PT Re-Evaluation 03/20/23    Authorization Type [employer] MCR    PT Start Time 1445    PT Stop Time 1530    PT Time Calculation (min) 45 min    Activity Tolerance Patient tolerated treatment well    Behavior During Therapy WFL for tasks assessed/performed              Past Medical History:  Diagnosis Date   Abnormal vaginal bleeding in postmenopausal patient 10/11/2021   Acute systolic heart failure (HCC)    Allergy    Asthma    Bilateral shoulder pain    Borderline hypertension    Bronchitis    CAD (coronary artery disease)    Chronic systolic CHF (congestive heart failure) (HCC)    Chronic systolic congestive heart failure, NYHA class 3 (HCC) 07/24/2018   Diabetes mellitus    Diagnosed in 2000, on insulin, Trulicity and metformin, not checking cgs at home   Drug-induced hypotension 12/20/2021   Gangrenous appendicitis with perforation s/p lap appendectomy 10/04/2017 10/04/2017   GERD (gastroesophageal reflux disease)    History of MI (myocardial infarction)    History of radiation therapy    Uterus- 12/14/21-02/01/22- Dr. Antony Blackbird   Hyperlipidemia    Hypertension    Hypothyroidism    MGUS (monoclonal gammopathy of unknown significance)    Mitral regurgitation    Myocardial infarction (HCC)    Neuropathy    Obesity    Sleep apnea    uses CPAP   Statin intolerance    Uterine cancer [facility])    Past Surgical History:  Procedure Laterality Date   BREATH TEK H PYLORI  09/18/2011   Procedure: BREATH TEK H PYLORI;  Surgeon: Kandis Cocking, MD;  Location: [facility];  Service: General;  Laterality: N/A;   CESAREAN SECTION     x 3   IR IMAGING GUIDED PORT INSERTION  12/07/2021   IR REMOVAL TUN  ACCESS W/ PORT W/O FL MOD SED  04/04/2022   LAPAROSCOPIC APPENDECTOMY N/A 10/03/2017   Procedure: APPENDECTOMY LAPAROSCOPIC;  Surgeon: Romie Levee, MD;  Location: [facility];  Service: General;  Laterality: N/A;   OPERATIVE ULTRASOUND N/A 02/01/2022   Procedure: OPERATIVE ULTRASOUND;  Surgeon: Antony Blackbird, MD;  Location: [facility];  Service: Urology;  Laterality: N/A;   RIGHT/LEFT HEART CATH AND CORONARY ANGIOGRAPHY N/A 02/12/2018   Procedure: RIGHT/LEFT HEART CATH AND CORONARY ANGIOGRAPHY;  Surgeon: Corky Crafts, MD;  Location: [facility] INVASIVE CV LAB;  Service: Cardiovascular;  Laterality: N/A;   ROBOTIC ASSISTED TOTAL HYSTERECTOMY WITH BILATERAL SALPINGO OOPHERECTOMY Bilateral 03/21/2022   Procedure: XI ROBOTIC ASSISTED TOTAL HYSTERECTOMY WITH BILATERAL SALPINGO OOPHORECTOMY, LYSIS OF ADHESIONS, CYSTOSCOPY;  Surgeon: Carver Fila, MD;  Location: [facility];  Service: Gynecology;  Laterality: Bilateral;   TANDEM RING INSERTION N/A 02/01/2022   Procedure: TANDEM RING INSERTION;  Surgeon: Antony Blackbird, MD;  Location: [facility];  Service: Urology;  Laterality: N/A;   TUBAL LIGATION     Patient Active Problem List   Diagnosis Date Noted   Closed fracture of distal fibula 07/17/2022   Hypomagnesemia 01/17/2022   Pancytopenia, acquired (HCC) 01/03/2022   Dehydration 12/20/2021   Elevated serum creatinine 12/20/2021  Diabetic peripheral neuropathy associated with type 2 diabetes mellitus (HCC) 12/02/2021   History of endometrial cancer 11/07/2021   Encounter to establish care 01/06/2021   Vitamin D deficiency 01/06/2021   Rash of back 01/06/2021   Claudication in peripheral vascular disease (HCC) 09/16/2020   Chronic systolic CHF (congestive heart failure), NYHA class 3 (HCC) 07/24/2018   Acquired hypothyroidism 07/24/2018   Cardiomyopathy, ischemic 07/24/2018   MGUS (monoclonal gammopathy of unknown significance) 03/06/2018   Dyspnea 01/14/2018   Allergic rhinitis 06/11/2014   Asthma due to  seasonal allergies 10/24/2012   OSA (obstructive sleep apnea) 05/23/2011   Poorly controlled type 2 diabetes mellitus with circulatory disorder (HCC) 03/26/2007   Hyperlipidemia associated with type 2 diabetes mellitus (HCC) 03/26/2007   Obesity, Class III, BMI 40-49.9 (morbid obesity) (HCC) 03/26/2007   Hypertension associated with type 2 diabetes mellitus (HCC) 03/26/2007    PCP: Tollie Eth, NP   REFERRING PROVIDER: de [place], Raymond J, MD   REFERRING DIAG:  S82.831D (ICD-10-CM) - Closed fracture of distal end of right fibula with routine healing, unspecified fracture morphology, subsequent encounter      THERAPY DIAG:  Muscle weakness (generalized)  Difficulty walking  Rationale for Evaluation and Treatment: Rehabilitation  ONSET DATE: 07/10/2022 Initial Fall  SUBJECTIVE:   SUBJECTIVE STATEMENT:   Pt plans to join [facility] after June 20th (has first cataract surgery that day). Pt reports she is using cane less in the house. She is able to walk 10-15 min prior to needing to rest.  PERTINENT HISTORY: CHF, DM neuropathy (sock distribution), CAD, previous uterine cancer PAIN:  Are you having pain? no : NPRS scale: 0/10 Location:  Lt knee Description:  Aggravating: night time Relieving:  ? Medication    PRECAUTIONS: Adhesive Allergy*  WEIGHT BEARING RESTRICTIONS: No  FALLS:  Has patient fallen in last 6 months? Yes. Number of falls 2, caught foot on an ottoman   LIVING ENVIRONMENT: Lives with: lives with their family and lives with their spouse Lives in: House/apartment Stairs: Yes, modified rise  Has following equipment at home: Single point cane, rails in bathroom   OCCUPATION: previously [employer], EKG   PLOF: Independent with basic ADLs  PATIENT GOALS: return to normal ADL, walk without a cane, "feel stronger":    OBJECTIVE:   DIAGNOSTIC FINDINGS:   FINDINGS: Similar-appearing mildly displaced distal fibular fracture. No new periosteal  reaction identified. There is no evidence of new acute fracture, dislocation, or joint effusion. There is no evidence of arthropathy or other focal bone abnormality. Soft tissues are unremarkable.   IMPRESSION: Similar-appearing mildly displaced distal fibular fracture. No new periosteal reaction identified.    PATIENT SURVEYS:  Lower Extremity Functional Score: 16 / 80 = 20.0 %  COGNITION: Overall cognitive status: Within functional limits for tasks assessed     SENSATION: Light touch: Impaired   POSTURE: rounded shoulders, increased thoracic kyphosis, anterior pelvic tilt, and flexed trunk    LOWER EXTREMITY ROM:  AROM of bilat hips limited by proximal LE weakness and body habitus; L LE AROM at the hip most limited with flexion and ER position  LOWER EXTREMITY MMT:  MMT Right eval Left eval  Hip flexion 4/5 4/5  Hip extension 4/5 4/5  Hip abduction 4/5 4/5  Hip adduction    Hip internal rotation    Hip external rotation 4/5 4/5  Knee flexion 4/5 4/5  Knee extension 4/5 4/5   (Blank rows = not tested)    FUNCTIONAL TESTS:  5 times sit to stand: 28.7s  TUG (modified): measured 74ft from treatment room door 16.5s  Unable to perform tandem or SLS  GAIT: Distance walked: 68ft Assistive device utilized: Single point cane Level of assistance: Modified independence Comments: bilat compensated [place], decreased L step length, APT   TODAY'S TREATMENT:                                                                                                                              DATE: 02/08/23  Pt seen for aquatic therapy today.  Treatment took place in water 3.5-4.75 ft in depth at the [facility] pool. Temp of water was 91.  Pt entered/exited the pool via stairs independently with bilat rail. * unsupported: walking forward/ backwards x 4 laps * side stepping with rainbow hand floats and arm addct x 3 laps * alternating hamstring curls  * monster walk  forward with coordinating UEs (backwards too difficult to coordinate) * tandem stance x 20s no UE support x 2 * lateral step up/down 4 stairs, R/L with bilat rail, 2 reps  * forward step up LLE, retro step down with RLE with bilat rail x 5 (cues for hip hinge prior to knee flexion) * holding rainbow hand floats:  heel raises x 10, leg swings into hip flex/extension x10 ;  hip add/abd x10 * kickboard row in staggered stance x 10 each LE forward, then repeated at 10+2 o'clock * return to walking forward for recovery *Cycling on noodle: hip add/abd; skiing  Pt requires the buoyancy and hydrostatic pressure of water for support, and to offload joints by unweighting joint load by at least 50 % in navel deep water and by at least 75-80% in chest to neck deep water.  Viscosity of the water is needed for resistance of strengthening. Water current perturbations provides challenge to standing balance requiring increased core activation.  PATIENT EDUCATION:  Education details: aquatic therapy exercise progressions/modifications  Person educated: Patient Education method: Explanation, Demonstration, Tactile cues, Verbal cues,  Education comprehension: verbalized understanding, returned demonstration, verbal cues required, and tactile cues required  HOME EXERCISE PROGRAM:  Access Code: [email or web address] URL: [email or web address] Date: 12/20/2022 Prepared by: Zebedee Iba  AQUATIC Access Code: [email or web address] URL: [email or web address] Date: 02/06/2023 Prepared by: [facility] [address] This aquatic home exercise program from [employer] utilizes pictures from land based exercises, but has been adapted prior to lamination and issuance.   ASSESSMENT:  CLINICAL IMPRESSION: Pt making good progress towards remaining goals.  Stairs continue to be a challenge due to decreased quad strength. She requires minor cues for mechanics of getting body over knee with hip hinge prior  to allowing knee to flex. She completed all exercises without pain.  Therapist to check STG#2 prior to entry in water.   Next visit is last scheduled visit - please assess all goals and need for additional visits.      OBJECTIVE IMPAIRMENTS: decreased activity tolerance, decreased balance, decreased  endurance, decreased mobility, decreased ROM, decreased strength, hypomobility, increased muscle spasms, impaired flexibility, improper body mechanics, postural dysfunction, obesity, and pain.    ACTIVITY LIMITATIONS: lifting, squatting, locomotion level, and dressing   PARTICIPATION LIMITATIONS: interpersonal relationship, community activity, occupation, and exercise   PERSONAL FACTORS: Age, Fitness, Lifestyle, Past/current experiences, and Time since onset of injury/illness/exacerbation, and 2-3 comorbidities are also affecting patient's functional outcome.    REHAB POTENTIAL: Fair   CLINICAL DECISION MAKING: unstable/complicated   EVALUATION COMPLEXITY: Moderate     GOALS:     SHORT TERM GOALS: Target date: 01/31/2023     Pt will become independent with HEP in order to demonstrate synthesis of PT education.   Goal status: Met 01/30/23   2. Pt will be able to demonstrate full hip/knee AROM in order to demonstrate functional improvement in LE function for self-care and house hold duties.      Goal status: INITIAL   3.  Pt will report at least 2 pt reduction on NPRS scale for pain in order to demonstrate functional improvement with household activity, self care, and ADL.    Goal status:MET - 01/23/23   LONG TERM GOALS: Target date: 03/14/2023       Pt  will become independent with final HEP in order to demonstrate synthesis of PT education.   Goal status: INITIAL   2.  Pt will have an at least 18 pt improvement in LEFS measure in order to demonstrate MCID improvement in daily function.    Goal status: INITIAL   3.  Pt will be able to lift/squat/hold >10 lbs in order to  demonstrate functional improvement in lumbopelvic strength for return to ADL and household activity.    Goal status: INITIAL   4.  Pt will be able to demonstrate reciprocal stair stepping with ascent and descent with single UE in order to demonstrate functional improvement in LE function for self-care and community ambulation.   Goal status: Ongoing - 02/08/23   5. Pt will be able to demonstrate/report ability to walk >20 mins in order to demonstrate functional improvement and tolerance to exercise and community mobility.  10 min  Goal status: Ongoing -02/08/23     PLAN:   PT FREQUENCY: 1-2x/week   PT DURATION: 12 weeks (plan to D/C in 8-10 wks)   PLANNED INTERVENTIONS: Therapeutic exercises, Therapeutic activity, Neuromuscular re-education, Balance training, Gait training, Patient/Family education, Self Care, Joint mobilization, Joint manipulation, Stair training, Aquatic Therapy, Dry Needling, Electrical stimulation, Spinal manipulation, Spinal mobilization, Cryotherapy, Moist heat, scar mobilization, Splintting, Taping, Vasopneumatic device, Traction, Ultrasound, Ionotophoresis 4mg /ml Dexamethasone, Manual therapy, and Re-evaluation   PLAN FOR NEXT SESSION: when able, cont aquatics: general conditioning, LE strength and exercise   .Mayer Camel, PTA 02/08/23 3:32 PM [facility] [address] [place], [place], [address] Phone: [phone]   Fax:  [phone]     Referring diagnosis? M62.81, r26.2 Treatment diagnosis? (if different than referring diagnosis)  S82.831D (ICD-10-CM) - Closed fracture of distal end of right fibula with routine healing, unspecified fracture morphology, subsequent encounter   What was this (referring dx) caused by? []  Surgery [x]  Fall [x]  Ongoing issue []  Arthritis []  Other: ____________  Laterality: []  Rt []  Lt [x]  Both  Check all possible CPT codes:      []  65784  (Therapeutic Exercise)  []  92507 (SLP Treatment)  []  97112 (Neuro Re-ed)   []  92526 (Swallowing Treatment)   []  97116 (Gait Training)   []  [id number] (Cognitive Training, 1st  15 minutes) []  97140 (Manual Therapy)   []  97130 (Cognitive Training, each add'l 15 minutes)  []  97530 (Therapeutic Activities)  []  Other, List CPT Code ____________    []  97535 (Self Care)       [x]  All codes above (97110 - 97535)  [x]  97012 (Mechanical Traction)  [x]  97014 (E-stim Unattended)  [x]  97032 (E-stim manual)  [x]  97033 (Ionto)  [x]  97035 (Ultrasound)  []  97760 (Orthotic Fit) []  [id number] (Physical Performance Training) [x]  [id number] (Aquatic Therapy) []  97034 (Contrast Bath) []  [id number] (Paraffin) []  97597 (Wound Care 1st 20 sq cm) []  97598 (Wound Care each add'l 20 sq cm) []  97016 (Vasopneumatic Device) []  [id number] Public affairs consultant) []  [id number] (Prosthetic Training)

## 2023-02-08 NOTE — Telephone Encounter (Signed)
   Telephone encounter was:  Unsuccessful.  02/08/2023 Name: MOZELL HABER MRN: [id number] DOB: 07-Jun-1956  Unsuccessful outbound call made today to assist with:  Transportation Needs   Outreach Attempt:  2nd Attempt  Unable to leave message, phone rang several times,voicemail does not pickup.  Catera Hankins [facility]  [facility] Population Health Community Resource Care Guide   [email or web address]  ?? [phone]   Website: [email or web address]  [email or web address]

## 2023-02-09 ENCOUNTER — Telehealth: Payer: Self-pay

## 2023-02-09 NOTE — Telephone Encounter (Signed)
   Telephone encounter was:  Unsuccessful.  02/09/2023 Name: Terri Heath MRN: [id number] DOB: Mar 21, 1956  Unsuccessful outbound call made today to assist with:  Transportation Needs   Outreach Attempt:  3rd Attempt.  Referral closed unable to contact patient.  A HIPAA compliant voice message was left requesting a return call.  Instructed patient to call back at [phone].  Mariyanna Mucha [facility]  [facility] Population Health Community Resource Care Guide   [email or web address]  ?? [phone]   Website: [email or web address]  [email or web address]

## 2023-02-12 DIAGNOSIS — Z1211 Encounter for screening for malignant neoplasm of colon: Secondary | ICD-10-CM | POA: Diagnosis not present

## 2023-02-13 ENCOUNTER — Ambulatory Visit (HOSPITAL_BASED_OUTPATIENT_CLINIC_OR_DEPARTMENT_OTHER): Payer: Medicare HMO | Admitting: Physical Therapy

## 2023-02-15 ENCOUNTER — Ambulatory Visit (HOSPITAL_BASED_OUTPATIENT_CLINIC_OR_DEPARTMENT_OTHER): Payer: Medicare HMO | Admitting: Physical Therapy

## 2023-02-15 ENCOUNTER — Ambulatory Visit: Payer: Medicare HMO | Admitting: Nurse Practitioner

## 2023-02-15 DIAGNOSIS — H2512 Age-related nuclear cataract, left eye: Secondary | ICD-10-CM | POA: Diagnosis not present

## 2023-02-16 ENCOUNTER — Other Ambulatory Visit (HOSPITAL_COMMUNITY): Payer: Self-pay

## 2023-02-16 DIAGNOSIS — H2511 Age-related nuclear cataract, right eye: Secondary | ICD-10-CM | POA: Diagnosis not present

## 2023-02-16 MED ORDER — KETOROLAC TROMETHAMINE 0.5 % OP SOLN
1.0000 [drp] | Freq: Four times a day (QID) | OPHTHALMIC | 1 refills | Status: DC
  Filled 2023-02-16: qty 5, 25d supply, fill #0
  Filled 2023-04-16: qty 5, 7d supply, fill #0

## 2023-02-16 MED ORDER — MOXIFLOXACIN HCL 0.5 % OP SOLN
1.0000 [drp] | Freq: Four times a day (QID) | OPHTHALMIC | 1 refills | Status: DC
  Filled 2023-02-16: qty 3, 15d supply, fill #0

## 2023-02-16 MED ORDER — PREDNISOLONE ACETATE 1 % OP SUSP
1.0000 [drp] | Freq: Four times a day (QID) | OPHTHALMIC | 1 refills | Status: DC
  Filled 2023-02-16 – 2023-04-16 (×2): qty 5, 25d supply, fill #0

## 2023-02-18 LAB — COLOGUARD: COLOGUARD: NEGATIVE

## 2023-02-19 ENCOUNTER — Other Ambulatory Visit (HOSPITAL_COMMUNITY): Payer: Self-pay

## 2023-02-26 ENCOUNTER — Other Ambulatory Visit (HOSPITAL_COMMUNITY): Payer: Self-pay

## 2023-03-08 DIAGNOSIS — H2511 Age-related nuclear cataract, right eye: Secondary | ICD-10-CM | POA: Diagnosis not present

## 2023-03-08 NOTE — Progress Notes (Signed)
[facility] Quality Team Note  Name: Terri Heath Date of Birth: [birth year] MRN: [id number] Date: 03/08/2023  [facility] Quality Team has reviewed this patient's chart, please see recommendations below:  [facility] Quality Other; (KED GAP- KIDNEY EVALUATION IN DIABETICS. PATIENT NEEDS URINE MICROALBUMIN/CREATININE RATIO TEST FOR GAP CLOSURE. GSD GAP- GLYCEMIC STATUS ASSESSMENT. PATIENT NEEDS A1C VALUE OF 9.0 OR LESS FOR GAP CLOSURE.)

## 2023-03-22 ENCOUNTER — Other Ambulatory Visit: Payer: Self-pay | Admitting: Nurse Practitioner

## 2023-03-22 ENCOUNTER — Other Ambulatory Visit (HOSPITAL_COMMUNITY): Payer: Self-pay

## 2023-03-22 MED FILL — Levothyroxine Sodium Tab 50 MCG: ORAL | 90 days supply | Qty: 90 | Fill #0 | Status: CN

## 2023-03-22 MED FILL — Metformin HCl Tab 1000 MG: ORAL | 90 days supply | Qty: 180 | Fill #0 | Status: CN

## 2023-03-27 ENCOUNTER — Other Ambulatory Visit (HOSPITAL_COMMUNITY): Payer: Self-pay

## 2023-03-27 ENCOUNTER — Other Ambulatory Visit (HOSPITAL_BASED_OUTPATIENT_CLINIC_OR_DEPARTMENT_OTHER): Payer: Self-pay

## 2023-03-27 MED FILL — Metformin HCl Tab 1000 MG: ORAL | 90 days supply | Qty: 180 | Fill #0 | Status: AC

## 2023-03-27 MED FILL — Levothyroxine Sodium Tab 50 MCG: ORAL | 90 days supply | Qty: 90 | Fill #0 | Status: AC

## 2023-03-29 ENCOUNTER — Ambulatory Visit: Payer: Medicare HMO | Admitting: Nurse Practitioner

## 2023-04-16 ENCOUNTER — Other Ambulatory Visit (HOSPITAL_COMMUNITY): Payer: Self-pay

## 2023-04-17 ENCOUNTER — Other Ambulatory Visit (HOSPITAL_COMMUNITY): Payer: Self-pay

## 2023-04-17 ENCOUNTER — Other Ambulatory Visit: Payer: Self-pay

## 2023-04-24 ENCOUNTER — Other Ambulatory Visit (HOSPITAL_COMMUNITY): Payer: Self-pay

## 2023-04-25 ENCOUNTER — Other Ambulatory Visit (HOSPITAL_COMMUNITY): Payer: Self-pay | Admitting: Cardiology

## 2023-04-25 ENCOUNTER — Other Ambulatory Visit (HOSPITAL_COMMUNITY): Payer: Self-pay

## 2023-04-26 ENCOUNTER — Other Ambulatory Visit: Payer: Self-pay

## 2023-04-26 ENCOUNTER — Other Ambulatory Visit (HOSPITAL_COMMUNITY): Payer: Self-pay

## 2023-04-27 ENCOUNTER — Other Ambulatory Visit (HOSPITAL_COMMUNITY): Payer: Self-pay

## 2023-04-27 ENCOUNTER — Other Ambulatory Visit: Payer: Self-pay

## 2023-04-27 MED ORDER — FUROSEMIDE 20 MG PO TABS
20.0000 mg | ORAL_TABLET | Freq: Every day | ORAL | 0 refills | Status: DC
  Filled 2023-04-27 – 2023-05-01 (×2): qty 90, 90d supply, fill #0

## 2023-04-27 MED ORDER — CARVEDILOL 6.25 MG PO TABS
6.2500 mg | ORAL_TABLET | Freq: Two times a day (BID) | ORAL | 0 refills | Status: DC
  Filled 2023-04-27: qty 180, 90d supply, fill #0

## 2023-05-01 ENCOUNTER — Other Ambulatory Visit (HOSPITAL_BASED_OUTPATIENT_CLINIC_OR_DEPARTMENT_OTHER): Payer: Self-pay

## 2023-05-01 ENCOUNTER — Other Ambulatory Visit (HOSPITAL_COMMUNITY): Payer: Self-pay

## 2023-05-03 ENCOUNTER — Ambulatory Visit (INDEPENDENT_AMBULATORY_CARE_PROVIDER_SITE_OTHER): Payer: Medicare HMO | Admitting: Nurse Practitioner

## 2023-05-03 ENCOUNTER — Encounter: Payer: Self-pay | Admitting: Nurse Practitioner

## 2023-05-03 ENCOUNTER — Other Ambulatory Visit (HOSPITAL_COMMUNITY): Payer: Self-pay

## 2023-05-03 VITALS — BP 110/68 | HR 104 | Wt 295.0 lb

## 2023-05-03 DIAGNOSIS — I5022 Chronic systolic (congestive) heart failure: Secondary | ICD-10-CM | POA: Diagnosis not present

## 2023-05-03 DIAGNOSIS — E039 Hypothyroidism, unspecified: Secondary | ICD-10-CM

## 2023-05-03 DIAGNOSIS — D61818 Other pancytopenia: Secondary | ICD-10-CM | POA: Diagnosis not present

## 2023-05-03 DIAGNOSIS — I152 Hypertension secondary to endocrine disorders: Secondary | ICD-10-CM | POA: Diagnosis not present

## 2023-05-03 DIAGNOSIS — E1165 Type 2 diabetes mellitus with hyperglycemia: Secondary | ICD-10-CM | POA: Diagnosis not present

## 2023-05-03 DIAGNOSIS — D472 Monoclonal gammopathy: Secondary | ICD-10-CM

## 2023-05-03 DIAGNOSIS — E559 Vitamin D deficiency, unspecified: Secondary | ICD-10-CM

## 2023-05-03 DIAGNOSIS — Z1231 Encounter for screening mammogram for malignant neoplasm of breast: Secondary | ICD-10-CM

## 2023-05-03 DIAGNOSIS — E1169 Type 2 diabetes mellitus with other specified complication: Secondary | ICD-10-CM | POA: Diagnosis not present

## 2023-05-03 DIAGNOSIS — I739 Peripheral vascular disease, unspecified: Secondary | ICD-10-CM

## 2023-05-03 DIAGNOSIS — E1159 Type 2 diabetes mellitus with other circulatory complications: Secondary | ICD-10-CM

## 2023-05-03 DIAGNOSIS — E1142 Type 2 diabetes mellitus with diabetic polyneuropathy: Secondary | ICD-10-CM

## 2023-05-03 DIAGNOSIS — Z23 Encounter for immunization: Secondary | ICD-10-CM | POA: Diagnosis not present

## 2023-05-03 DIAGNOSIS — E785 Hyperlipidemia, unspecified: Secondary | ICD-10-CM | POA: Diagnosis not present

## 2023-05-03 DIAGNOSIS — G720 Drug-induced myopathy: Secondary | ICD-10-CM

## 2023-05-03 LAB — POCT UA - MICROALBUMIN
Albumin/Creatinine Ratio, Urine, POC: 14.3
Creatinine, POC: 265.9 mg/dL
Microalbumin Ur, POC: 38 mg/L

## 2023-05-03 MED ORDER — METFORMIN HCL 1000 MG PO TABS
1000.0000 mg | ORAL_TABLET | Freq: Two times a day (BID) | ORAL | 1 refills | Status: DC
  Filled 2023-05-03 – 2023-07-17 (×2): qty 180, 90d supply, fill #0
  Filled 2023-10-05: qty 180, 90d supply, fill #1

## 2023-05-03 MED ORDER — FUROSEMIDE 20 MG PO TABS
20.0000 mg | ORAL_TABLET | Freq: Every day | ORAL | 1 refills | Status: DC
  Filled 2023-05-03 – 2023-07-30 (×2): qty 90, 90d supply, fill #0
  Filled 2023-10-05 – 2023-10-22 (×4): qty 90, 90d supply, fill #1

## 2023-05-03 MED ORDER — ENTRESTO 24-26 MG PO TABS
1.0000 | ORAL_TABLET | Freq: Two times a day (BID) | ORAL | 1 refills | Status: DC
  Filled 2023-05-03 – 2023-07-30 (×2): qty 180, 90d supply, fill #0
  Filled 2023-10-05 – 2023-10-22 (×3): qty 180, 90d supply, fill #1

## 2023-05-03 MED ORDER — CARVEDILOL 6.25 MG PO TABS
6.2500 mg | ORAL_TABLET | Freq: Two times a day (BID) | ORAL | 1 refills | Status: DC
  Filled 2023-05-03 – 2023-07-17 (×2): qty 180, 90d supply, fill #0
  Filled 2023-10-05: qty 180, 90d supply, fill #1

## 2023-05-03 MED ORDER — TIRZEPATIDE 2.5 MG/0.5ML ~~LOC~~ SOAJ
2.5000 mg | SUBCUTANEOUS | 0 refills | Status: DC
  Filled 2023-05-03 – 2023-05-04 (×2): qty 2, 28d supply, fill #0

## 2023-05-03 MED ORDER — LEVOTHYROXINE SODIUM 50 MCG PO TABS
50.0000 ug | ORAL_TABLET | Freq: Every day | ORAL | 1 refills | Status: DC
  Filled 2023-05-03 – 2023-07-17 (×2): qty 90, 90d supply, fill #0
  Filled 2023-10-05: qty 90, 90d supply, fill #1

## 2023-05-03 MED ORDER — SPIRONOLACTONE 25 MG PO TABS
25.0000 mg | ORAL_TABLET | Freq: Every day | ORAL | 1 refills | Status: DC
  Filled 2023-05-03 – 2023-08-03 (×2): qty 90, 90d supply, fill #0
  Filled 2023-10-05 – 2023-10-22 (×5): qty 90, 90d supply, fill #1

## 2023-05-03 NOTE — Assessment & Plan Note (Signed)
Monitoring today. 

## 2023-05-03 NOTE — Assessment & Plan Note (Signed)
Chronic. BP stable No alarm symptoms present at this time.  taking as prescribed. Goal blood pressure less than less than 130/80. Currently is followed with cardiology.  Plan: current treatment plan is effective, no change in therapy.  Refills have been provided today. Labs today to check electrolytes and kidney function.  Will make changes to plan of care as necessary based on lab results.  Plan to follow-up in 4months.

## 2023-05-03 NOTE — Assessment & Plan Note (Signed)
Chronic. Complications including DM, HTN, HLD, OSA, CHF are present. Recommend continued exercise in the pool and close dietary monitoring. I am hopeful we can get affordable GLP-1 options to better help control her blood sugar and weight to reduce risks.

## 2023-05-03 NOTE — Assessment & Plan Note (Signed)
Peripheral neuropathy present in the setting of DM and previous chemotherapy treatment, although symptoms were present prior to chemotherapy. No worsening of symptoms noted. Foot exam normal.  Plan: - Take good care of your feet by wearing shoes that fit well and do not rub at all times.  - Check your feet every day for blisters, redness, or signs of infection - Wash and dry the feet very well every day and apply lotion to the feet, but avoid in between the toes as this can cause rubbing.

## 2023-05-03 NOTE — Assessment & Plan Note (Signed)
Chronic CHF appears controlled at this time with no signs of edema, crackles, or ShOB. CUrrently on aldactone, lasix, entresto, and coreg. Followed closely with cardiology.  Plan: - Continue your current medications.  - Please let us know immediately if you have any changes

## 2023-05-03 NOTE — Assessment & Plan Note (Signed)
Chronic. Stable. Currently managed with levothyroxine. No symptoms are present at this time. Labs pending today. Plan: - Refills have been sent to the pharmacy. If the dosing needs to be changed I will let you know.

## 2023-05-03 NOTE — Progress Notes (Signed)
Terri Clamp, DNP, AGNP-c [facility]  [address] [place], [place] [address] [phone]  ESTABLISHED PATIENT- Chronic Health and/or Follow-Up Visit  Blood pressure 110/68, pulse (!) 104, weight 295 lb (133.8 kg), SpO2 98%.    Terri Heath is a 67 y.o. year old female presenting today for evaluation and management of chronic conditions.   She tells me she had a fall a couple of weeks ago getting out of the pool at the gym and she is still experiencing soreness from this. She is using a cane and occasionally a walker to help with ambulation. She has started back with water aerobics and hopes this will continue to help.   She tells me her blood sugars are running 145-165. She is having increased hunger. No increased thirst or urination are reported.  She has not been able to remain on the Ozempic due to inability to afford the medication. She would like to remain on this or transition to something that is more affordable. We discussed options today.   She is not having headaches, dizziness, or palpitations.  She has no vaginal bleeding.  She denies thyroid symptoms.   All ROS negative with exception of what is listed above.   PHYSICAL EXAM Physical Exam Vitals and nursing note reviewed.  Constitutional:      General: She is not in acute distress.    Appearance: Normal appearance. She is obese.  HENT:     Head: Normocephalic.  Eyes:     Pupils: Pupils are equal, round, and reactive to light.  Neck:     Vascular: No carotid bruit.  Cardiovascular:     Rate and Rhythm: Normal rate and regular rhythm.     Pulses: Normal pulses.     Heart sounds: Normal heart sounds.  Pulmonary:     Effort: Pulmonary effort is normal.     Breath sounds: Normal breath sounds.  Abdominal:     General: Bowel sounds are normal. There is no distension.     Palpations: Abdomen is soft.     Tenderness: There is no abdominal tenderness. There is no guarding.   Musculoskeletal:        General: Normal range of motion.     Cervical back: Normal range of motion.     Right lower leg: No edema.     Left lower leg: No edema.  Skin:    General: Skin is warm and dry.     Capillary Refill: Capillary refill takes less than 2 seconds.  Neurological:     General: No focal deficit present.     Mental Status: She is alert and oriented to person, place, and time.     Sensory: Sensory deficit present.     Motor: Weakness present.  Psychiatric:        Mood and Affect: Mood normal.        Behavior: Behavior normal.     PLAN Problem List Items Addressed This Visit     Poorly controlled type 2 diabetes mellitus with circulatory disorder (HCC) - Primary    Chronic. BG waxing and waning course. Associated with Hypertension, Hyperlipidemia, Peripheral Vascular Disease, and Hyperthyroidism. No symptoms present today. Currently managed with metformin. Foot Exam HM Status: was completed today. Urine Micro HM Status: was completed today. A1c HM Status: was completed today. CMP HM Status: was completed today. Lipids HM Status: was completed today. Eye Exam HM Status: is up to date- records are needed. Pna 20 to be given at next visit  Plan: Glucose monitoring once daily with goals for AM fasting blood sugar 70-110 and 2 hours after meals blood sugar <150.  Diet should include 150-180 grams of carbohydrates, 20-30 grams of protein, 30 grams of fiber, and no more than 15 grams of saturated fat per day.  Exercise goals should include an average of 150 minutes of moderate intensity activity per week.  Blood pressure monitoring weekly with goal <130/80. Continue diabetes medications. Will send in script for mounjaro to see if this is better covered. Patient provided with patient assistance paperwork for Ozempic to be completed and sent in.  Labs ordered. Plan to follow-up in 4months.          Relevant Medications   tirzepatide (MOUNJARO) 2.5 MG/0.5ML Pen    metFORMIN (GLUCOPHAGE) 1000 MG tablet   Other Relevant Orders   Hemoglobin A1c   CBC with Differential/Platelet   Comprehensive metabolic panel   Lipid panel   Magnesium   POCT UA - Microalbumin (Completed)   Hyperlipidemia associated with type 2 diabetes mellitus (HCC)    Chronic. Lipids monitored today. No alarm symptoms present at this time. LDL Goal < 70. Controlled with  repatha .  Plan: Continue current lipid-lowering therapy. orders written for new lab studies as appropriate; see orders Recommend Treatment of diabetes , Weight reduction , and Strict diet and exercise Diet and exercise recommendations provided.  Follow-up in 4months or sooner based on lab findings as appropriate.         Relevant Medications   tirzepatide (MOUNJARO) 2.5 MG/0.5ML Pen   metFORMIN (GLUCOPHAGE) 1000 MG tablet   furosemide (LASIX) 20 MG tablet   spironolactone (ALDACTONE) 25 MG tablet   sacubitril-valsartan (ENTRESTO) 24-26 MG   carvedilol (COREG) 6.25 MG tablet   Other Relevant Orders   Hemoglobin A1c   CBC with Differential/Platelet   Comprehensive metabolic panel   Lipid panel   Magnesium   Obesity, Class III, BMI 40-49.9 (morbid obesity) (HCC)    Chronic. Complications including DM, HTN, HLD, OSA, CHF are present. Recommend continued exercise in the pool and close dietary monitoring. I am hopeful we can get affordable GLP-1 options to better help control her blood sugar and weight to reduce risks.       Relevant Medications   tirzepatide (MOUNJARO) 2.5 MG/0.5ML Pen   metFORMIN (GLUCOPHAGE) 1000 MG tablet   Other Relevant Orders   Hemoglobin A1c   CBC with Differential/Platelet   Comprehensive metabolic panel   Lipid panel   Magnesium   Hypertension associated with type 2 diabetes mellitus (HCC)    Chronic. BP stable No alarm symptoms present at this time.  taking as prescribed. Goal blood pressure less than less than 130/80. Currently is followed with cardiology.  Plan: current  treatment plan is effective, no change in therapy.  Refills have been provided today. Labs today to check electrolytes and kidney function.  Will make changes to plan of care as necessary based on lab results.  Plan to follow-up in 4months.       Relevant Medications   tirzepatide (MOUNJARO) 2.5 MG/0.5ML Pen   metFORMIN (GLUCOPHAGE) 1000 MG tablet   furosemide (LASIX) 20 MG tablet   spironolactone (ALDACTONE) 25 MG tablet   sacubitril-valsartan (ENTRESTO) 24-26 MG   carvedilol (COREG) 6.25 MG tablet   Other Relevant Orders   Hemoglobin A1c   CBC with Differential/Platelet   Comprehensive metabolic panel   Lipid panel   Magnesium   Chronic systolic CHF (congestive heart failure), NYHA class 3 (HCC)  Chronic CHF appears controlled at this time with no signs of edema, crackles, or ShOB. CUrrently on aldactone, lasix, entresto, and coreg. Followed closely with cardiology.  Plan: - Continue your current medications.  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 Monitor       Diabetic peripheral neuropathy associated with type 2 diabetes mellitus (HCC)    Peripheral neuropathy present in the setting of DM and previous chemotherapy treatment, although symptoms were present prior to chemotherapy. No worsening of symptoms noted. Foot exam normal.  Plan: - Take good care of your feet by wearing shoes that fit well and do not rub at all times.  - Check your feet every day for blisters, redness, or signs of infection - Wash and dry the feet very well every day and apply lotion to the  feet, but avoid in between the toes as this can cause rubbing.       Relevant Medications   tirzepatide (MOUNJARO) 2.5 MG/0.5ML Pen   metFORMIN (GLUCOPHAGE) 1000 MG tablet   Pancytopenia, acquired (HCC)    Continue to monitor. Once stable for 12 months will remove.       Relevant Orders   Hemoglobin A1c   CBC with Differential/Platelet   Comprehensive metabolic panel   Lipid panel   Magnesium   MGUS (monoclonal gammopathy of unknown significance)    Continue observation and following with oncology.       Relevant Orders   Hemoglobin A1c   CBC with Differential/Platelet   Comprehensive metabolic panel   Lipid panel   Magnesium   Acquired hypothyroidism    Chronic. Stable. Currently managed with levothyroxine. No symptoms are present at this time. Labs pending today. Plan: - Refills have been sent to the pharmacy. If the dosing needs to be changed I will let you know.       Relevant Medications   levothyroxine (SYNTHROID) 50 MCG tablet   carvedilol (COREG) 6.25 MG tablet   Other Relevant Orders   Hemoglobin A1c   CBC with Differential/Platelet   Comprehensive metabolic panel   Lipid panel   Magnesium   TSH   Vitamin D deficiency    Monitoring today      Relevant Orders   Hemoglobin A1c   CBC with Differential/Platelet   Comprehensive metabolic panel   Lipid panel   Magnesium   Hypomagnesemia    Currently on replacement. Monitoring today      Relevant Orders   Hemoglobin A1c   CBC with Differential/Platelet   Comprehensive metabolic panel   Lipid panel   Magnesium   Statin myopathy   Other Visit Diagnoses     Need for influenza vaccination       Relevant Orders   Flu Vaccine Trivalent High Dose (Fluad) (Completed)   Hemoglobin A1c   CBC with Differential/Platelet   Comprehensive metabolic panel   Lipid panel   Magnesium   Need for shingles vaccine           Return in about 6 months (around 10/31/2023) for Med Management 30.  Terri Clamp, DNP, AGNP-c

## 2023-05-03 NOTE — Assessment & Plan Note (Signed)
Currently on replacement. Monitoring today

## 2023-05-03 NOTE — Assessment & Plan Note (Signed)
Chronic and stable. Monitor

## 2023-05-03 NOTE — Assessment & Plan Note (Signed)
Chronic. Lipids monitored today. No alarm symptoms present at this time. LDL Goal < 70. Controlled with  repatha .  Plan: Continue current lipid-lowering therapy. orders written for new lab studies as appropriate; see orders Recommend Treatment of diabetes , Weight reduction , and Strict diet and exercise Diet and exercise recommendations provided.  Follow-up in 4months or sooner based on lab findings as appropriate.

## 2023-05-03 NOTE — Assessment & Plan Note (Signed)
Continue to monitor. Once stable for 12 months will remove.

## 2023-05-03 NOTE — Assessment & Plan Note (Signed)
Continue observation and following with oncology.

## 2023-05-03 NOTE — Assessment & Plan Note (Signed)
Chronic. BG waxing and waning course. Associated with Hypertension, Hyperlipidemia, Peripheral Vascular Disease, and Hyperthyroidism. No symptoms present today. Currently managed with metformin. Foot Exam HM Status: was completed today. Urine Micro HM Status: was completed today. A1c HM Status: was completed today. CMP HM Status: was completed today. Lipids HM Status: was completed today. Eye Exam HM Status: is up to date- records are needed. Pna 20 to be given at next visit  Plan: Glucose monitoring once daily with goals for AM fasting blood sugar 70-110 and 2 hours after meals blood sugar <150.  Diet should include 150-180 grams of carbohydrates, 20-30 grams of protein, 30 grams of fiber, and no more than 15 grams of saturated fat per day.  Exercise goals should include an average of 150 minutes of moderate intensity activity per week.  Blood pressure monitoring weekly with goal <130/80. Continue diabetes medications. Will send in script for mounjaro to see if this is better covered. Patient provided with patient assistance paperwork for Ozempic to be completed and sent in.  Labs ordered. Plan to follow-up in 4months.

## 2023-05-04 ENCOUNTER — Other Ambulatory Visit: Payer: Self-pay

## 2023-05-04 ENCOUNTER — Other Ambulatory Visit (HOSPITAL_COMMUNITY): Payer: Self-pay

## 2023-05-04 ENCOUNTER — Other Ambulatory Visit (HOSPITAL_BASED_OUTPATIENT_CLINIC_OR_DEPARTMENT_OTHER): Payer: Self-pay

## 2023-05-04 LAB — CBC WITH DIFFERENTIAL/PLATELET
Basophils Absolute: 0.1 10*3/uL (ref 0.0–0.2)
Basos: 1 %
EOS (ABSOLUTE): 0.3 10*3/uL (ref 0.0–0.4)
Eos: 3 %
Hematocrit: 39.7 % (ref 34.0–46.6)
Hemoglobin: 13 g/dL (ref 11.1–15.9)
Immature Grans (Abs): 0 10*3/uL (ref 0.0–0.1)
Immature Granulocytes: 1 %
Lymphocytes Absolute: 1.6 10*3/uL (ref 0.7–3.1)
Lymphs: 20 %
MCH: 28.8 pg (ref 26.6–33.0)
MCHC: 32.7 g/dL (ref 31.5–35.7)
MCV: 88 fL (ref 79–97)
Monocytes Absolute: 0.5 10*3/uL (ref 0.1–0.9)
Monocytes: 7 %
Neutrophils Absolute: 5.5 10*3/uL (ref 1.4–7.0)
Neutrophils: 68 %
Platelets: 252 10*3/uL (ref 150–450)
RBC: 4.51 x10E6/uL (ref 3.77–5.28)
RDW: 13.2 % (ref 11.7–15.4)
WBC: 8 10*3/uL (ref 3.4–10.8)

## 2023-05-04 LAB — COMPREHENSIVE METABOLIC PANEL
ALT: 17 IU/L (ref 0–32)
AST: 15 IU/L (ref 0–40)
Albumin: 4 g/dL (ref 3.9–4.9)
Alkaline Phosphatase: 78 IU/L (ref 44–121)
BUN/Creatinine Ratio: 18 (ref 12–28)
BUN: 21 mg/dL (ref 8–27)
Bilirubin Total: <0.2 mg/dL (ref 0.0–1.2)
CO2: 22 mmol/L (ref 20–29)
Calcium: 9.5 mg/dL (ref 8.7–10.3)
Chloride: 100 mmol/L (ref 96–106)
Creatinine, Ser: 1.18 mg/dL — ABNORMAL HIGH (ref 0.57–1.00)
Globulin, Total: 3.3 g/dL (ref 1.5–4.5)
Glucose: 221 mg/dL — ABNORMAL HIGH (ref 70–99)
Potassium: 5.8 mmol/L — ABNORMAL HIGH (ref 3.5–5.2)
Sodium: 135 mmol/L (ref 134–144)
Total Protein: 7.3 g/dL (ref 6.0–8.5)
eGFR: 51 mL/min/{1.73_m2} — ABNORMAL LOW (ref >59)

## 2023-05-04 LAB — LIPID PANEL
Chol/HDL Ratio: 3.4 ratio (ref 0.0–4.4)
Cholesterol, Total: 181 mg/dL (ref 100–199)
HDL: 53 mg/dL (ref >39)
LDL Chol Calc (NIH): 89 mg/dL (ref 0–99)
Triglycerides: 235 mg/dL — ABNORMAL HIGH (ref 0–149)
VLDL Cholesterol Cal: 39 mg/dL (ref 5–40)

## 2023-05-04 LAB — HEMOGLOBIN A1C
Est. average glucose Bld gHb Est-mCnc: 220 mg/dL
Hgb A1c MFr Bld: 9.3 % — ABNORMAL HIGH (ref 4.8–5.6)

## 2023-05-04 LAB — MAGNESIUM: Magnesium: 2 mg/dL (ref 1.6–2.3)

## 2023-05-04 LAB — TSH: TSH: 3.2 u[IU]/mL (ref 0.450–4.500)

## 2023-05-10 ENCOUNTER — Inpatient Hospital Stay: Payer: Medicare HMO | Attending: Gynecologic Oncology | Admitting: Gynecologic Oncology

## 2023-05-10 ENCOUNTER — Telehealth: Payer: Self-pay | Admitting: *Deleted

## 2023-05-10 DIAGNOSIS — C541 Malignant neoplasm of endometrium: Secondary | ICD-10-CM

## 2023-05-10 NOTE — Progress Notes (Unsigned)
Gynecologic Oncology Return Clinic Visit  05/10/23  Reason for Visit: surveillance in the setting of endometrial cancer   Treatment History: Oncology History Overview Note  Endometrioid adenocarcinoma   History of endometrial cancer  10/27/2021 Pathology Results   FINAL MICROSCOPIC DIAGNOSIS:   A. ENDOMETRIUM, BIOPSY:  - Endometrioid adenocarcinoma with focal secretory features.  - See comment.   COMMENT:  The carcinoma has endometrioid features with a few small foci with secretory features.  There are several foci of solid pattern and the findings are consistent with FIGO grade 1/2.  Immunohistochemistry shows diffuse strong positivity estrogen receptor and Napsin A is negative.  P53 shows wild-type pattern.    11/07/2021 Initial Diagnosis   Endometrial cancer (HCC)   11/14/2021 Imaging   MRI pelvis 1. Thickened (18 mm) endometrium, compatible with known endometrial malignancy. Evidence of full-thickness myometrial invasion by endometrial tumor throughout the left uterine body extending over 180 degrees of involvement. Probable early small focus of direct tumor invasion into the parametrial soft tissues in the anterior left uterine body as detailed. 2. Suspect a combination of direct endometrial tumor involvement of the upper endocervical canal and blood products within the endocervix. 3. No pelvic lymphadenopathy.  No adnexal masses.       11/24/2021 Imaging   CT abdomen and pelvis No evidence of abdominal metastatic disease or other acute findings.   Mild hepatic steatosis.   Colonic diverticulosis, without radiographic evidence of diverticulitis.   Aortic Atherosclerosis (ICD10-I70.0).     12/02/2021 Cancer Staging   Staging form: Corpus Uteri - Carcinoma and Carcinosarcoma, AJCC 8th Edition - Clinical stage from 12/02/2021: FIGO Stage IIIB (cT3b, cN0, cM0) - Signed by Artis Delay, MD on 12/02/2021 Stage prefix: Initial diagnosis   12/07/2021 Procedure   Status post  placement of right IJ port catheter   12/13/2021 - 01/10/2022 Chemotherapy   Patient is on Treatment Plan : Uterine Cisplatin days 1 and 29 + XRT      12/14/2021 - 02/01/2022 Radiation Therapy   Radiation Treatment Dates: 12/14/2021 through 02/01/2022 (IMRT : 12/14/21 through 01/17/22) (Brachytherapy - Tandem Ring : 02/01/22)  Site Technique Total Dose (Gy) Dose per Fx (Gy) Completed Fx Beam Energies  Uterus: Uterus IMRT 45/45 1.8 25/25 10X  Uterus: Uterus_Bst HDR-brachy 5.5/5.5 5.5 1/1 Ir-192        01/25/2022 Imaging   MRI pelvis Decreased endometrial thickness, consistent with interval response to therapy. Persistent deep myometrial invasion (> 50%) noted in the left lateral uterine corpus. No evidence of extra-uterine tumor extension.    No evidence of pelvic metastatic disease.   Sigmoid diverticulosis, without evidence of diverticulitis.   03/21/2022 Pathology Results   FINAL MICROSCOPIC DIAGNOSIS:   A. 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, FIGO 1  Focal residual atypical hyperplasia with squamous morular metaplasia  Tumor invades greater than 50% of the myometrium (9.75 mm of 16 mm)  (ypT1b)  Margins free  Extensive adenomyosis  Background inactive endometrium  Serosal endosalpingosis  Acute and chronic cervicitis with mucosal necrosis and numerous  nabothian cysts and tunnel clusters  Benign fallopian tubes and ovaries   ONCOLOGY TABLE:   UTERUS, CARCINOMA OR CARCINOSARCOMA: Resection   Procedure: Total hysterectomy and bilateral salpingo-oophorectomy  Histologic Type: Endometrioid adenocarcinoma  Histologic Grade: Well-differentiated, FIGO 1  Myometrial Invasion:       Depth of Myometrial Invasion (mm): 9.75 mm       Myometrial Thickness (mm):  16 mm       Percentage of Myometrial Invasion: 61%  Uterine Serosa Involvement: Not  identified  Cervical stromal Involvement: Not identified  Extent of involvement of other tissue/organs: Not identified  Peritoneal/Ascitic Fluid: Not available  Lymphovascular Invasion: Not identified  Regional Lymph Nodes: Not applicable (no lymph nodes submitted or found)   Distant Metastasis: Not applicable  Pathologic Stage Classification (pTNM, AJCC 8th Edition): ypT1b, pN n/a  Ancillary Studies: MMR / MSI has been ordered and will be reported in an  addendum  Representative Tumor Block: A7  Comment(s): The anterior endomyometrium shows extensive adenomyosis and scattered intramyometrial psammomatous calcifications.  Residual invasive tumor is identified within the posterior endomyometrium.  The otherwise benign endometrial and cervical epithelium shows apparent reactive/therapy associated atypia.  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 Obesity made retroperitoneal visualization limited and increased the complexity of the case and necessitated additional instrumentation for retraction. Obesity related complexity increased the duration of the procedure by 45 minutes.    Surgeon: Eugene Garnet MD    Operative Findings:  On EUA, 10 cm mobile uterus. On intra-abdominal entry, normal upper abdominal survey.  Omentum densely adherent to anterior abdominal wall along prior midline infraumbilical incision.  Normal-appearing small and large bowel.  Difficult intra-abdominal adiposity.  Cecum somewhat adherent to the right abdominal wall secondary to prior appendectomy.  Uterus approximately  8-10 cm and somewhat bulbous.  Normal-appearing bilateral adnexa.  No obvious adenopathy.  Some edema of the retroperitoneum.  Densely adherent bladder to the uterus and cervix from prior cesarean sections.  No gross evidence of disease. On cystoscopy, intact bladder dome. Good efflux from bilateral ureteral orifices.   04/05/2022 Procedure   Removal of implanted Port-A-Cath utilizing sharp and blunt dissection. The procedure was uncomplicated.   09/04/2022 Imaging   CT A/P: No acute abdominal or pelvic pathology. No evidence abdominal or pelvic metastatic disease.     Interval History: ***  Past Medical/Surgical History: Past Medical History:  Diagnosis Date   Abnormal vaginal bleeding in postmenopausal patient 10/11/2021   Acute systolic heart failure (HCC)    Allergy    Asthma    Bilateral shoulder pain    Borderline hypertension    Bronchitis    CAD (coronary artery disease)    Chronic systolic CHF (congestive heart failure) (HCC)    Chronic systolic congestive heart failure, NYHA class 3 (HCC) 07/24/2018   Closed fracture of distal fibula 07/17/2022   Dehydration 12/20/2021   Diabetes mellitus    Diagnosed in 2000, on insulin, Trulicity and metformin, not checking cgs at home   Drug-induced hypotension 12/20/2021   Elevated serum creatinine 12/20/2021   Gangrenous appendicitis with perforation s/p lap appendectomy 10/04/2017 10/04/2017   GERD (gastroesophageal reflux disease)    History of MI (myocardial infarction)    History of radiation therapy    Uterus- 12/14/21-02/01/22- Dr. Antony Blackbird   Hyperlipidemia    Hypertension    Hypothyroidism    MGUS (monoclonal gammopathy of unknown significance)    Mitral regurgitation    Myocardial infarction (HCC)    Neuropathy    Obesity    Rash of back 01/06/2021   Sleep apnea  uses CPAP   Statin intolerance    Uterine cancer [facility])     Past Surgical History:  Procedure Laterality Date   BREATH TEK H PYLORI  09/18/2011    Procedure: BREATH TEK H PYLORI;  Surgeon: Kandis Cocking, MD;  Location: [facility];  Service: General;  Laterality: N/A;   CESAREAN SECTION     x 3   IR IMAGING GUIDED PORT INSERTION  12/07/2021   IR REMOVAL TUN ACCESS W/ PORT W/O FL MOD SED  04/04/2022   LAPAROSCOPIC APPENDECTOMY N/A 10/03/2017   Procedure: APPENDECTOMY LAPAROSCOPIC;  Surgeon: Romie Levee, MD;  Location: [facility];  Service: General;  Laterality: N/A;   OPERATIVE ULTRASOUND N/A 02/01/2022   Procedure: OPERATIVE ULTRASOUND;  Surgeon: Antony Blackbird, MD;  Location: [facility];  Service: Urology;  Laterality: N/A;   RIGHT/LEFT HEART CATH AND CORONARY ANGIOGRAPHY N/A 02/12/2018   Procedure: RIGHT/LEFT HEART CATH AND CORONARY ANGIOGRAPHY;  Surgeon: Corky Crafts, MD;  Location: [facility] INVASIVE CV LAB;  Service: Cardiovascular;  Laterality: N/A;   ROBOTIC ASSISTED TOTAL HYSTERECTOMY WITH BILATERAL SALPINGO OOPHERECTOMY Bilateral 03/21/2022   Procedure: XI ROBOTIC ASSISTED TOTAL HYSTERECTOMY WITH BILATERAL SALPINGO OOPHORECTOMY, LYSIS OF ADHESIONS, CYSTOSCOPY;  Surgeon: Carver Fila, MD;  Location: [facility];  Service: Gynecology;  Laterality: Bilateral;   TANDEM RING INSERTION N/A 02/01/2022   Procedure: TANDEM RING INSERTION;  Surgeon: Antony Blackbird, MD;  Location: [facility];  Service: Urology;  Laterality: N/A;   TUBAL LIGATION      Family History  Problem Relation Age of Onset   Alcohol abuse Mother    Arthritis Mother    Diabetes Mother    Sleep apnea Mother    Anxiety disorder Mother    Eating disorder Mother    Obesity Mother    Cancer Father    Alcohol abuse Father    Heart disease Father    Hypertension Father    Stroke Father    Sleep apnea Sister    Breast cancer Neg Hx    Colon cancer Neg Hx    Ovarian cancer Neg Hx    Endometrial cancer Neg Hx    Pancreatic cancer Neg Hx    Prostate cancer Neg Hx     Social History   Socioeconomic History   Marital status: Married    Spouse name: Not on file   Number of  children: Not on file   Years of education: Not on file   Highest education level: Not on file  Occupational History   Occupation: retired Sales promotion account executive, worked at [employer]  Tobacco Use   Smoking status: Former    Current packs/day: 0.00    Average packs/day: 1 pack/day for 15.0 years (15.0 ttl pk-yrs)    Types: Cigarettes    Start date: 01/29/1975    Quit date: 01/28/1990    Years since quitting: 33.3   Smokeless tobacco: Never  Vaping Use   Vaping status: Never Used  Substance and Sexual Activity   Alcohol use: Not Currently   Drug use: No   Sexual activity: Not Currently    Birth control/protection: Post-menopausal, Abstinence  Other Topics Concern   Not on file  Social History Narrative   Lives in [place]   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   Social Determinants of Health   Financial Resource Strain: Low Risk  (05/03/2023)   Overall Financial Resource Strain (CARDIA)    Difficulty of Paying Living Expenses: Not very hard  Food Insecurity: No  Food Insecurity (05/03/2023)   Hunger Vital Sign    Worried About Running Out of Food in the Last Year: Never true    Ran Out of Food in the Last Year: Never true  Transportation Needs: No Transportation Needs (05/03/2023)   PRAPARE - Administrator, Civil Service (Medical): No    Lack of Transportation (Non-Medical): No  Recent Concern: Transportation Needs - Unmet Transportation Needs (02/02/2023)   PRAPARE - Transportation    Lack of Transportation (Medical): Yes    Lack of Transportation (Non-Medical): Yes  Physical Activity: Insufficiently Active (05/03/2023)   Exercise Vital Sign    Days of Exercise per Week: 1 day    Minutes of Exercise per Session: 30 min  Stress: No Stress Concern Present (05/03/2023)   [employer] of Occupational Health - Occupational Stress Questionnaire    Feeling of Stress : Only a little  Social Connections: Unknown (05/03/2023)   Social Connection and Isolation Panel [NHANES]     Frequency of Communication with Friends and Family: Three times a week    Frequency of Social Gatherings with Friends and Family: Once a week    Attends Religious Services: Patient unable to answer    Active Member of Clubs or Organizations: Yes    Attends Banker Meetings: Never    Marital Status: Married    Current Medications:  Current Outpatient Medications:    albuterol (PROAIR HFA) 108 (90 Base) MCG/ACT inhaler, Inhale 2 puffs into the lungs every 6 (six) hours as needed. wheezing, Disp: 18 g, Rfl: 2   aspirin 81 MG chewable tablet, Chew 1 tablet (81 mg total) by mouth daily., Disp: 90 tablet, Rfl: 3   blood glucose meter kit and supplies, Use up to four times daily as directed. (FOR ICD-10 E10.9, E11.9)., Disp: 1 each, Rfl: 99   carvedilol (COREG) 6.25 MG tablet, Take 1 tablet (6.25 mg total) by mouth 2 (two) times daily., Disp: 180 tablet, Rfl: 1   Evolocumab (REPATHA SURECLICK) 140 MG/ML SOAJ, Inject 140 mg into the skin every 14 (fourteen) days., Disp: 6 mL, Rfl: 3   furosemide (LASIX) 20 MG tablet, Take 1 tablet (20 mg total) by mouth daily., Disp: 90 tablet, Rfl: 1   glucose blood test strip, Use as directed to test blood sugar, Disp: 100 each, Rfl: PRN   insulin glargine (LANTUS) 100 UNIT/ML Solostar Pen, Inject 25-40 Units into the skin daily., Disp: 45 mL, Rfl: 3   levothyroxine (SYNTHROID) 50 MCG tablet, Take 1 tablet (50 mcg total) by mouth daily, before a meal., Disp: 90 tablet, Rfl: 1   magnesium oxide (MAG-OX) 400 (240 Mg) MG tablet, Take 1 tablet (400 mg total) by mouth daily., Disp: 30 tablet, Rfl: 0   metFORMIN (GLUCOPHAGE) 1000 MG tablet, Take 1 tablet (1,000 mg total) by mouth 2 (two) times daily with a meal., Disp: 180 tablet, Rfl: 1   Multiple Vitamin (MULITIVITAMIN WITH MINERALS) TABS, Take 1 tablet by mouth daily with breakfast., Disp: , Rfl:    NON FORMULARY, Pt uses a cpap nightly (Patient not taking: Reported on 05/03/2023), Disp: , Rfl:    OneTouch  Delica Lancets 33G MISC, use as directed, Disp: 100 each, Rfl: 1   sacubitril-valsartan (ENTRESTO) 24-26 MG, Take 1 tablet by mouth 2 (two) times daily., Disp: 180 tablet, Rfl: 1   spironolactone (ALDACTONE) 25 MG tablet, Take 1 tablet (25 mg total) by mouth daily., Disp: 90 tablet, Rfl: 1   tirzepatide (MOUNJARO) 2.5 MG/0.5ML Pen,  Inject 2.5 mg into the skin once a week., Disp: 2 mL, Rfl: 0   triamcinolone ointment (KENALOG) 0.1 %, Apply 1 application topically two times daily to affected area(s) as needed, sparing use to avoid whitening/thinning skin, Disp: 30 g, Rfl: 1  Review of Systems: Denies appetite changes, fevers, chills, fatigue, unexplained weight changes. Denies hearing loss, neck lumps or masses, mouth sores, ringing in ears or voice changes. Denies cough or wheezing.  Denies shortness of breath. Denies chest pain or palpitations. Denies leg swelling. Denies abdominal distention, pain, blood in stools, constipation, diarrhea, nausea, vomiting, or early satiety. Denies pain with intercourse, dysuria, frequency, hematuria or incontinence. Denies hot flashes, pelvic pain, vaginal bleeding or vaginal discharge.   Denies joint pain, back pain or muscle pain/cramps. Denies itching, rash, or wounds. Denies dizziness, headaches, numbness or seizures. Denies swollen lymph nodes or glands, denies easy bruising or bleeding. Denies anxiety, depression, confusion, or decreased concentration.  Physical Exam: There were no vitals taken for this visit. General: ***Alert, oriented, no acute distress. HEENT: ***Posterior oropharynx clear, sclera anicteric. Chest: ***Clear to auscultation bilaterally.  ***Port site clean. Cardiovascular: ***Regular rate and rhythm, no murmurs. Abdomen: ***Obese, soft, nontender.  Normoactive bowel sounds.  No masses or hepatosplenomegaly appreciated.  ***Well-healed scar. Extremities: ***Grossly normal range of motion.  Warm, well perfused.  No edema  bilaterally. Skin: ***No rashes or lesions noted. Lymphatics: ***No cervical, supraclavicular, or inguinal adenopathy. GU: Normal appearing external genitalia without erythema, excoriation, or lesions.  Speculum exam reveals ***.  Bimanual exam reveals ***.  ***Rectovaginal exam  confirms ___.  Laboratory & Radiologic Studies: None new  Assessment & Plan: Terri Heath is a 67 y.o. 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 Surgery 02/2022. p53 wildtype. 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 MSI-H. MLH1 promoter hyper methylation present.    Patient is doing well and continues to be NED on exam today.    Per NCCN surveillance recommendations, we will plan on surveillance visits every 3 months for the first 2-3 years.  We discussed signs and symptoms that would be concerning for cancer recurrence, and I stressed the importance of calling if she develops any of these.  *** minutes of total time was spent for this patient encounter, including preparation, face-to-face counseling with the patient and coordination of care, and documentation of the encounter.  Eugene Garnet, MD  Division of Gynecologic Oncology  Department of Obstetrics and Gynecology  [facility]

## 2023-05-10 NOTE — Telephone Encounter (Signed)
Attempted to reach [name] in regards to her missed appointment today. Left voicemail requesting call back to [phone].   Attempted to reach Terri Heath spouse Terri Heath at [phone] in regards to patient's missed appointment today. Left voicemail requesting call back.

## 2023-05-11 NOTE — Telephone Encounter (Signed)
Attempted to reach Terri Heath in regards to her missed appointment yesterday. Left voicemail requesting call back.

## 2023-05-14 ENCOUNTER — Telehealth: Payer: Self-pay | Admitting: *Deleted

## 2023-05-14 NOTE — Telephone Encounter (Signed)
Spoke with Ms. [name] who returned the office's message. Pt has been rescheduled for December 26 th at 1:15 with Dr. Pricilla Holm. Pt states she tried to cancel her appt. On Thursday, Sept. 12 via text message but apparently it didn't work. Pt states she fell and would have been to uncomfortable for the exam table. Pt agreed to new date and time and had no further concerns or questions at this time.

## 2023-05-17 ENCOUNTER — Encounter: Payer: Self-pay | Admitting: Nurse Practitioner

## 2023-05-18 ENCOUNTER — Other Ambulatory Visit: Payer: Medicare HMO

## 2023-05-18 DIAGNOSIS — E875 Hyperkalemia: Secondary | ICD-10-CM

## 2023-05-19 LAB — CMP14+EGFR
ALT: 20 IU/L (ref 0–32)
AST: 16 IU/L (ref 0–40)
Albumin: 4 g/dL (ref 3.9–4.9)
Alkaline Phosphatase: 89 IU/L (ref 44–121)
BUN/Creatinine Ratio: 25 (ref 12–28)
BUN: 24 mg/dL (ref 8–27)
Bilirubin Total: 0.2 mg/dL (ref 0.0–1.2)
CO2: 23 mmol/L (ref 20–29)
Calcium: 9.7 mg/dL (ref 8.7–10.3)
Chloride: 101 mmol/L (ref 96–106)
Creatinine, Ser: 0.95 mg/dL (ref 0.57–1.00)
Globulin, Total: 3.5 g/dL (ref 1.5–4.5)
Glucose: 176 mg/dL — ABNORMAL HIGH (ref 70–99)
Potassium: 5.2 mmol/L (ref 3.5–5.2)
Sodium: 139 mmol/L (ref 134–144)
Total Protein: 7.5 g/dL (ref 6.0–8.5)
eGFR: 66 mL/min/{1.73_m2} (ref >59)

## 2023-06-07 ENCOUNTER — Telehealth: Payer: Medicare HMO | Admitting: Physician Assistant

## 2023-06-07 ENCOUNTER — Emergency Department (HOSPITAL_COMMUNITY): Payer: Medicare HMO

## 2023-06-07 ENCOUNTER — Ambulatory Visit (HOSPITAL_COMMUNITY)
Admission: RE | Admit: 2023-06-07 | Discharge: 2023-06-07 | Disposition: A | Discharge status: Home / Self Care | Payer: Medicare HMO | Source: Ambulatory Visit | Attending: Emergency Medicine | Admitting: Emergency Medicine

## 2023-06-07 ENCOUNTER — Encounter (HOSPITAL_COMMUNITY): Payer: Self-pay

## 2023-06-07 ENCOUNTER — Other Ambulatory Visit: Payer: Self-pay

## 2023-06-07 ENCOUNTER — Encounter (HOSPITAL_COMMUNITY): Payer: Self-pay | Admitting: *Deleted

## 2023-06-07 ENCOUNTER — Inpatient Hospital Stay (HOSPITAL_COMMUNITY)
Admission: EM | Admit: 2023-06-07 | Discharge: 2023-06-14 | DRG: 871 | Disposition: A | Discharge status: Home Health | Payer: Medicare HMO | Attending: Internal Medicine | Admitting: Internal Medicine

## 2023-06-07 VITALS — BP 85/55 | HR 107 | Temp 99.2°F | Resp 19

## 2023-06-07 DIAGNOSIS — Z818 Family history of other mental and behavioral disorders: Secondary | ICD-10-CM

## 2023-06-07 DIAGNOSIS — I252 Old myocardial infarction: Secondary | ICD-10-CM

## 2023-06-07 DIAGNOSIS — Z79890 Hormone replacement therapy (postmenopausal): Secondary | ICD-10-CM | POA: Diagnosis not present

## 2023-06-07 DIAGNOSIS — N764 Abscess of vulva: Secondary | ICD-10-CM | POA: Diagnosis present

## 2023-06-07 DIAGNOSIS — K611 Rectal abscess: Secondary | ICD-10-CM | POA: Diagnosis not present

## 2023-06-07 DIAGNOSIS — L03315 Cellulitis of perineum: Principal | ICD-10-CM

## 2023-06-07 DIAGNOSIS — Z7982 Long term (current) use of aspirin: Secondary | ICD-10-CM

## 2023-06-07 DIAGNOSIS — N179 Acute kidney failure, unspecified: Secondary | ICD-10-CM | POA: Diagnosis present

## 2023-06-07 DIAGNOSIS — I340 Nonrheumatic mitral (valve) insufficiency: Secondary | ICD-10-CM | POA: Diagnosis present

## 2023-06-07 DIAGNOSIS — N762 Acute vulvitis: Secondary | ICD-10-CM | POA: Diagnosis present

## 2023-06-07 DIAGNOSIS — I5042 Chronic combined systolic (congestive) and diastolic (congestive) heart failure: Secondary | ICD-10-CM | POA: Diagnosis not present

## 2023-06-07 DIAGNOSIS — Z87891 Personal history of nicotine dependence: Secondary | ICD-10-CM

## 2023-06-07 DIAGNOSIS — E039 Hypothyroidism, unspecified: Secondary | ICD-10-CM | POA: Diagnosis present

## 2023-06-07 DIAGNOSIS — Z6841 Body Mass Index (BMI) 40.0 and over, adult: Secondary | ICD-10-CM

## 2023-06-07 DIAGNOSIS — G4733 Obstructive sleep apnea (adult) (pediatric): Secondary | ICD-10-CM | POA: Diagnosis present

## 2023-06-07 DIAGNOSIS — Z8261 Family history of arthritis: Secondary | ICD-10-CM

## 2023-06-07 DIAGNOSIS — Z923 Personal history of irradiation: Secondary | ICD-10-CM

## 2023-06-07 DIAGNOSIS — Z9221 Personal history of antineoplastic chemotherapy: Secondary | ICD-10-CM

## 2023-06-07 DIAGNOSIS — L02215 Cutaneous abscess of perineum: Secondary | ICD-10-CM | POA: Diagnosis not present

## 2023-06-07 DIAGNOSIS — I110 Hypertensive heart disease with heart failure: Secondary | ICD-10-CM | POA: Diagnosis present

## 2023-06-07 DIAGNOSIS — Z8249 Family history of ischemic heart disease and other diseases of the circulatory system: Secondary | ICD-10-CM

## 2023-06-07 DIAGNOSIS — Z8542 Personal history of malignant neoplasm of other parts of uterus: Secondary | ICD-10-CM

## 2023-06-07 DIAGNOSIS — E1165 Type 2 diabetes mellitus with hyperglycemia: Secondary | ICD-10-CM

## 2023-06-07 DIAGNOSIS — Z833 Family history of diabetes mellitus: Secondary | ICD-10-CM

## 2023-06-07 DIAGNOSIS — R6521 Severe sepsis with septic shock: Secondary | ICD-10-CM | POA: Diagnosis not present

## 2023-06-07 DIAGNOSIS — Z7984 Long term (current) use of oral hypoglycemic drugs: Secondary | ICD-10-CM

## 2023-06-07 DIAGNOSIS — E785 Hyperlipidemia, unspecified: Secondary | ICD-10-CM | POA: Diagnosis present

## 2023-06-07 DIAGNOSIS — I959 Hypotension, unspecified: Secondary | ICD-10-CM | POA: Diagnosis not present

## 2023-06-07 DIAGNOSIS — I2510 Atherosclerotic heart disease of native coronary artery without angina pectoris: Secondary | ICD-10-CM | POA: Diagnosis present

## 2023-06-07 DIAGNOSIS — Z79899 Other long term (current) drug therapy: Secondary | ICD-10-CM | POA: Diagnosis not present

## 2023-06-07 DIAGNOSIS — R571 Hypovolemic shock: Secondary | ICD-10-CM | POA: Diagnosis not present

## 2023-06-07 DIAGNOSIS — Z880 Allergy status to penicillin: Secondary | ICD-10-CM

## 2023-06-07 DIAGNOSIS — E1151 Type 2 diabetes mellitus with diabetic peripheral angiopathy without gangrene: Secondary | ICD-10-CM | POA: Diagnosis present

## 2023-06-07 DIAGNOSIS — E1159 Type 2 diabetes mellitus with other circulatory complications: Secondary | ICD-10-CM

## 2023-06-07 DIAGNOSIS — E1140 Type 2 diabetes mellitus with diabetic neuropathy, unspecified: Secondary | ICD-10-CM | POA: Diagnosis present

## 2023-06-07 DIAGNOSIS — Z823 Family history of stroke: Secondary | ICD-10-CM

## 2023-06-07 DIAGNOSIS — Z90710 Acquired absence of both cervix and uterus: Secondary | ICD-10-CM

## 2023-06-07 DIAGNOSIS — A419 Sepsis, unspecified organism: Principal | ICD-10-CM | POA: Diagnosis present

## 2023-06-07 DIAGNOSIS — E11649 Type 2 diabetes mellitus with hypoglycemia without coma: Secondary | ICD-10-CM | POA: Diagnosis not present

## 2023-06-07 DIAGNOSIS — S32511A Fracture of superior rim of right pubis, initial encounter for closed fracture: Secondary | ICD-10-CM | POA: Diagnosis not present

## 2023-06-07 DIAGNOSIS — R918 Other nonspecific abnormal finding of lung field: Secondary | ICD-10-CM | POA: Diagnosis not present

## 2023-06-07 DIAGNOSIS — J9811 Atelectasis: Secondary | ICD-10-CM | POA: Diagnosis present

## 2023-06-07 DIAGNOSIS — Z811 Family history of alcohol abuse and dependence: Secondary | ICD-10-CM

## 2023-06-07 DIAGNOSIS — Z7409 Other reduced mobility: Secondary | ICD-10-CM | POA: Diagnosis present

## 2023-06-07 DIAGNOSIS — N750 Cyst of Bartholin's gland: Secondary | ICD-10-CM | POA: Diagnosis present

## 2023-06-07 DIAGNOSIS — K828 Other specified diseases of gallbladder: Secondary | ICD-10-CM | POA: Diagnosis not present

## 2023-06-07 DIAGNOSIS — E876 Hypokalemia: Secondary | ICD-10-CM | POA: Diagnosis present

## 2023-06-07 DIAGNOSIS — K219 Gastro-esophageal reflux disease without esophagitis: Secondary | ICD-10-CM | POA: Diagnosis present

## 2023-06-07 DIAGNOSIS — Z91048 Other nonmedicinal substance allergy status: Secondary | ICD-10-CM

## 2023-06-07 DIAGNOSIS — E66813 Obesity, class 3: Secondary | ICD-10-CM | POA: Diagnosis present

## 2023-06-07 DIAGNOSIS — Z794 Long term (current) use of insulin: Secondary | ICD-10-CM | POA: Diagnosis not present

## 2023-06-07 DIAGNOSIS — J45909 Unspecified asthma, uncomplicated: Secondary | ICD-10-CM | POA: Diagnosis present

## 2023-06-07 DIAGNOSIS — K5730 Diverticulosis of large intestine without perforation or abscess without bleeding: Secondary | ICD-10-CM | POA: Diagnosis not present

## 2023-06-07 DIAGNOSIS — Z888 Allergy status to other drugs, medicaments and biological substances status: Secondary | ICD-10-CM

## 2023-06-07 DIAGNOSIS — Z7985 Long-term (current) use of injectable non-insulin antidiabetic drugs: Secondary | ICD-10-CM

## 2023-06-07 HISTORY — DX: Abscess of vulva: N76.4

## 2023-06-07 LAB — COMPREHENSIVE METABOLIC PANEL
ALT: 10 U/L (ref 0–44)
AST: 13 U/L — ABNORMAL LOW (ref 15–41)
Albumin: 2.5 g/dL — ABNORMAL LOW (ref 3.5–5.0)
Alkaline Phosphatase: 105 U/L (ref 38–126)
Anion gap: 13 (ref 5–15)
BUN: 30 mg/dL — ABNORMAL HIGH (ref 8–23)
CO2: 20 mmol/L — ABNORMAL LOW (ref 22–32)
Calcium: 8.6 mg/dL — ABNORMAL LOW (ref 8.9–10.3)
Chloride: 101 mmol/L (ref 98–111)
Creatinine, Ser: 1.5 mg/dL — ABNORMAL HIGH (ref 0.44–1.00)
GFR, Estimated: 38 mL/min — ABNORMAL LOW (ref >60)
Glucose, Bld: 224 mg/dL — ABNORMAL HIGH (ref 70–99)
Potassium: 4.5 mmol/L (ref 3.5–5.1)
Sodium: 134 mmol/L — ABNORMAL LOW (ref 135–145)
Total Bilirubin: 0.6 mg/dL (ref 0.3–1.2)
Total Protein: 7.3 g/dL (ref 6.5–8.1)

## 2023-06-07 LAB — I-STAT CG4 LACTIC ACID, ED
Lactic Acid, Venous: 2.2 mmol/L (ref 0.5–1.9)
Lactic Acid, Venous: 1.2 mmol/L (ref 0.5–1.9)

## 2023-06-07 LAB — CBC WITH DIFFERENTIAL/PLATELET
Abs Immature Granulocytes: 0.16 10*3/uL — ABNORMAL HIGH (ref 0.00–0.07)
Basophils Absolute: 0.1 10*3/uL (ref 0.0–0.1)
Basophils Relative: 0 %
Eosinophils Absolute: 0 10*3/uL (ref 0.0–0.5)
Eosinophils Relative: 0 %
HCT: 34.5 % — ABNORMAL LOW (ref 36.0–46.0)
Hemoglobin: 11 g/dL — ABNORMAL LOW (ref 12.0–15.0)
Immature Granulocytes: 1 %
Lymphocytes Relative: 4 %
Lymphs Abs: 0.8 10*3/uL (ref 0.7–4.0)
MCH: 29.1 pg (ref 26.0–34.0)
MCHC: 31.9 g/dL (ref 30.0–36.0)
MCV: 91.3 fL (ref 80.0–100.0)
Monocytes Absolute: 1.2 10*3/uL — ABNORMAL HIGH (ref 0.1–1.0)
Monocytes Relative: 7 %
Neutro Abs: 15.5 10*3/uL — ABNORMAL HIGH (ref 1.7–7.7)
Neutrophils Relative %: 88 %
Platelets: 123 10*3/uL — ABNORMAL LOW (ref 150–400)
RBC: 3.78 MIL/uL — ABNORMAL LOW (ref 3.87–5.11)
RDW: 13.8 % (ref 11.5–15.5)
WBC: 17.7 10*3/uL — ABNORMAL HIGH (ref 4.0–10.5)
nRBC: 0 % (ref 0.0–0.2)

## 2023-06-07 LAB — URINALYSIS, W/ REFLEX TO CULTURE (INFECTION SUSPECTED)
Bilirubin Urine: NEGATIVE
Glucose, UA: NEGATIVE mg/dL
Ketones, ur: 5 mg/dL — AB
Nitrite: NEGATIVE
Protein, ur: 30 mg/dL — AB
Specific Gravity, Urine: >1.046 — ABNORMAL HIGH (ref 1.005–1.030)
WBC, UA: >50 WBC/hpf (ref 0–5)
pH: 5 (ref 5.0–8.0)

## 2023-06-07 LAB — APTT: aPTT: 29 s (ref 24–36)

## 2023-06-07 LAB — PROTIME-INR
INR: 1.2 (ref 0.8–1.2)
Prothrombin Time: 15.8 s — ABNORMAL HIGH (ref 11.4–15.2)

## 2023-06-07 LAB — POCT FASTING CBG KUC MANUAL ENTRY: POCT Glucose (KUC): 233 mg/dL — AB (ref 70–99)

## 2023-06-07 MED ORDER — VANCOMYCIN HCL IN DEXTROSE 1-5 GM/200ML-% IV SOLN
1000.0000 mg | Freq: Once | INTRAVENOUS | Status: DC
  Filled 2023-06-07: qty 200

## 2023-06-07 MED ORDER — INSULIN GLARGINE-YFGN 100 UNIT/ML ~~LOC~~ SOLN
25.0000 [IU] | Freq: Every day | SUBCUTANEOUS | Status: DC
  Administered 2023-06-08 – 2023-06-11 (×4): 25 [IU] via SUBCUTANEOUS
  Filled 2023-06-07 (×4): qty 0.25

## 2023-06-07 MED ORDER — SODIUM CHLORIDE 0.9 % IV BOLUS
1000.0000 mL | Freq: Once | INTRAVENOUS | Status: AC
  Administered 2023-06-07: 1000 mL via INTRAVENOUS

## 2023-06-07 MED ORDER — POLYETHYLENE GLYCOL 3350 17 G PO PACK
17.0000 g | PACK | Freq: Every day | ORAL | Status: DC | PRN
  Administered 2023-06-08 – 2023-06-09 (×2): 17 g via ORAL
  Filled 2023-06-07 (×2): qty 1

## 2023-06-07 MED ORDER — VANCOMYCIN HCL 10 G IV SOLR
2500.0000 mg | Freq: Once | INTRAVENOUS | Status: AC
  Administered 2023-06-07: 2500 mg via INTRAVENOUS
  Filled 2023-06-07: qty 25

## 2023-06-07 MED ORDER — IOHEXOL 350 MG/ML SOLN
65.0000 mL | Freq: Once | INTRAVENOUS | Status: AC | PRN
  Administered 2023-06-07: 65 mL via INTRAVENOUS

## 2023-06-07 MED ORDER — METRONIDAZOLE 500 MG PO TABS
500.0000 mg | ORAL_TABLET | Freq: Three times a day (TID) | ORAL | Status: DC
  Administered 2023-06-08 (×2): 500 mg via ORAL
  Filled 2023-06-07 (×3): qty 1

## 2023-06-07 MED ORDER — ACETAMINOPHEN 500 MG PO TABS
1000.0000 mg | ORAL_TABLET | Freq: Four times a day (QID) | ORAL | Status: DC | PRN
  Administered 2023-06-08 – 2023-06-14 (×18): 1000 mg via ORAL
  Filled 2023-06-07 (×19): qty 2

## 2023-06-07 MED ORDER — SODIUM CHLORIDE 0.9 % IV SOLN: 2.0000 g | INTRAVENOUS | Status: DC

## 2023-06-07 MED ORDER — ASPIRIN 81 MG PO CHEW
81.0000 mg | CHEWABLE_TABLET | Freq: Every day | ORAL | Status: DC
  Administered 2023-06-08 – 2023-06-14 (×7): 81 mg via ORAL
  Filled 2023-06-07 (×7): qty 1

## 2023-06-07 MED ORDER — SODIUM CHLORIDE 0.9% FLUSH
3.0000 mL | Freq: Two times a day (BID) | INTRAVENOUS | Status: DC
  Administered 2023-06-08 – 2023-06-14 (×8): 3 mL via INTRAVENOUS

## 2023-06-07 MED ORDER — HYDROMORPHONE HCL 1 MG/ML IJ SOLN
0.5000 mg | Freq: Once | INTRAMUSCULAR | Status: AC
  Administered 2023-06-07: 0.5 mg via INTRAVENOUS
  Filled 2023-06-07: qty 1

## 2023-06-07 MED ORDER — ALBUTEROL SULFATE (2.5 MG/3ML) 0.083% IN NEBU
2.5000 mg | INHALATION_SOLUTION | Freq: Four times a day (QID) | RESPIRATORY_TRACT | Status: DC | PRN
  Administered 2023-06-10 – 2023-06-12 (×4): 2.5 mg via RESPIRATORY_TRACT
  Filled 2023-06-07 (×4): qty 3

## 2023-06-07 MED ORDER — LEVOTHYROXINE SODIUM 50 MCG PO TABS
50.0000 ug | ORAL_TABLET | Freq: Every day | ORAL | Status: DC
  Administered 2023-06-08 – 2023-06-14 (×7): 50 ug via ORAL
  Filled 2023-06-07 (×2): qty 1
  Filled 2023-06-07: qty 2
  Filled 2023-06-07 (×5): qty 1

## 2023-06-07 MED ORDER — INSULIN ASPART 100 UNIT/ML IJ SOLN
0.0000 [IU] | Freq: Every day | INTRAMUSCULAR | Status: DC
  Administered 2023-06-08 (×2): 2 [IU] via SUBCUTANEOUS

## 2023-06-07 MED ORDER — SODIUM CHLORIDE 0.9 % IV SOLN
2.0000 g | Freq: Once | INTRAVENOUS | Status: AC
  Administered 2023-06-07: 2 g via INTRAVENOUS
  Filled 2023-06-07: qty 20

## 2023-06-07 MED ORDER — INSULIN ASPART 100 UNIT/ML IJ SOLN
0.0000 [IU] | Freq: Three times a day (TID) | INTRAMUSCULAR | Status: DC
  Administered 2023-06-08: 8 [IU] via SUBCUTANEOUS
  Administered 2023-06-08 (×2): 3 [IU] via SUBCUTANEOUS
  Administered 2023-06-09: 5 [IU] via SUBCUTANEOUS
  Administered 2023-06-09 – 2023-06-10 (×3): 3 [IU] via SUBCUTANEOUS
  Administered 2023-06-10: 5 [IU] via SUBCUTANEOUS
  Administered 2023-06-10 – 2023-06-11 (×2): 8 [IU] via SUBCUTANEOUS
  Administered 2023-06-11: 3 [IU] via SUBCUTANEOUS
  Administered 2023-06-11 – 2023-06-12 (×3): 8 [IU] via SUBCUTANEOUS
  Administered 2023-06-12: 2 [IU] via SUBCUTANEOUS
  Administered 2023-06-13: 3 [IU] via SUBCUTANEOUS
  Administered 2023-06-13: 2 [IU] via SUBCUTANEOUS
  Administered 2023-06-13 – 2023-06-14 (×2): 5 [IU] via SUBCUTANEOUS
  Administered 2023-06-14: 3 [IU] via SUBCUTANEOUS

## 2023-06-07 NOTE — ED Notes (Signed)
Patient to CT BP WNL

## 2023-06-07 NOTE — Discharge Instructions (Addendum)
Sent to ED via [facility] 

## 2023-06-07 NOTE — Progress Notes (Signed)
Pharmacy Antibiotic Note  Terri Heath is a 67 y.o. female presents on 06/07/2023 with labial abscess. Pt was hypotensive in ED with response to fluids. No plans for surgical management at this time. Noted to be in AKI (baseline Scr <1). Pharmacy has been consulted for vancomycin dosing. Ceftriaxone and flagyl per MD.   Plan: Vancomycin 1500mg  q24h (eAUC 460, Scr 1.5) F/u renal function, length of therapy and narrow as able with culture data  Vancomycin levels as needed  Height: 5\' 7"  (170.2 cm) Weight: 132.5 kg (292 lb) IBW/kg (Calculated) : 61.6  Temp (24hrs), Avg:98.5 F (36.9 C), Min:97.6 F (36.4 C), Max:99.2 F (37.3 C)  Recent Labs  Lab 06/07/23 1544 06/07/23 1622 06/07/23 1754  WBC 17.7*  --   --   CREATININE 1.50*  --   --   LATICACIDVEN  --  2.2* 1.2    Estimated Creatinine Clearance: 52.4 mL/min (A) (by C-G formula based on SCr of 1.5 mg/dL (H)).    Allergies  Allergen Reactions   Amoxicillin Other (See Comments)    Dizziness, abdominal pain   Adhesive [Tape] Rash    Tegaderm   Exenatide Other (See Comments)    Flu-like symptoms   Invokana [Canagliflozin] Other (See Comments)    Yeast infection/dehydration   Jardiance [Empagliflozin] Other (See Comments)    Yeast infections and dehydration   Lisinopril Other (See Comments)    cramping   Other Diarrhea and Other (See Comments)    Kale=food  Per patient, it causes GI bleeding    Statins Other (See Comments)    Leg cramps    Antimicrobials this admission: Vancomycin 10/10 > Ceftriaxone 10/10 > Flagyl 10/10 >   Microbiology results: 10/10 BCx: pending 10/10 abscess cs: pending   Thank you for allowing pharmacy to be a part of this patient's care.  Marja Kays 06/07/2023 10:37 PM

## 2023-06-07 NOTE — ED Provider Notes (Signed)
[place] [facility] AT [facility] Provider Note  CSN: [id number] Arrival date & time: 06/07/23 1454  Chief Complaint(s) Abscess  HPI Terri Heath is a 67 y.o. female with past medical history as below, significant for systolic heart failure, CAD, DM, obesity, hypertension, hyperlipidemia, hypothyroid, mitral regurg who presents to the ED with complaint of feeling unwell, sent from urgent care for concern of sepsis  Reports feeling unwell for 2 days.  Pain to her perineal area, concern for an abscess, draining malodorous discharge.  Pain to her labial area and perineum.  No significant change in bowel or bladder function.  No significant abdominal pain otherwise.  No nausea or vomiting.  No fevers but does report some chills, fatigue and malaise also reported.  No chest pain or dyspnea  Past Medical History Past Medical History:  Diagnosis Date   Abnormal vaginal bleeding in postmenopausal patient 10/11/2021   Acute systolic heart failure (HCC)    Allergy    Asthma    Bilateral shoulder pain    Borderline hypertension    Bronchitis    CAD (coronary artery disease)    Chronic systolic CHF (congestive heart failure) (HCC)    Chronic systolic congestive heart failure, NYHA class 3 (HCC) 07/24/2018   Closed fracture of distal fibula 07/17/2022   Dehydration 12/20/2021   Diabetes mellitus    Diagnosed in 2000, on insulin, Trulicity and metformin, not checking cgs at home   Drug-induced hypotension 12/20/2021   Elevated serum creatinine 12/20/2021   Gangrenous appendicitis with perforation s/p lap appendectomy 10/04/2017 10/04/2017   GERD (gastroesophageal reflux disease)    History of MI (myocardial infarction)    History of radiation therapy    Uterus- 12/14/21-02/01/22- Dr. Antony Blackbird   Hyperlipidemia    Hypertension    Hypothyroidism    MGUS (monoclonal gammopathy of unknown significance)    Mitral regurgitation    Myocardial infarction (HCC)     Neuropathy    Obesity    Rash of back 01/06/2021   Sleep apnea    uses CPAP   Statin intolerance    Uterine cancer [facility])    Patient Active Problem List   Diagnosis Date Noted   Statin myopathy 05/03/2023   Hypomagnesemia 01/17/2022   Pancytopenia, acquired (HCC) 01/03/2022   Diabetic peripheral neuropathy associated with type 2 diabetes mellitus (HCC) 12/02/2021   History of endometrial cancer 11/07/2021   Encounter to establish care 01/06/2021   Vitamin D deficiency 01/06/2021   Claudication in peripheral vascular disease (HCC) 09/16/2020   Chronic systolic CHF (congestive heart failure), NYHA class 3 (HCC) 07/24/2018   Acquired hypothyroidism 07/24/2018   Cardiomyopathy, ischemic 07/24/2018   MGUS (monoclonal gammopathy of unknown significance) 03/06/2018   Dyspnea 01/14/2018   Allergic rhinitis 06/11/2014   Asthma due to seasonal allergies 10/24/2012   OSA (obstructive sleep apnea) 05/23/2011   Poorly controlled type 2 diabetes mellitus with circulatory disorder (HCC) 03/26/2007   Hyperlipidemia associated with type 2 diabetes mellitus (HCC) 03/26/2007   Obesity, Class III, BMI 40-49.9 (morbid obesity) (HCC) 03/26/2007   Hypertension associated with type 2 diabetes mellitus (HCC) 03/26/2007   Home Medication(s) Prior to Admission medications   Medication Sig Start Date End Date Taking? Authorizing Provider  albuterol (PROAIR HFA) 108 (90 Base) MCG/ACT inhaler Inhale 2 puffs into the lungs every 6 (six) hours as needed. wheezing 02/21/19   Oretha Milch, MD  aspirin 81 MG chewable tablet Chew 1 tablet (81 mg total) by mouth daily. 10/27/22  Laurey Morale, MD  blood glucose meter kit and supplies Use up to four times daily as directed. (FOR ICD-10 E10.9, E11.9). 02/10/22   Tollie Eth, NP  carvedilol (COREG) 6.25 MG tablet Take 1 tablet (6.25 mg total) by mouth 2 (two) times daily. 05/03/23   Tollie Eth, NP  Evolocumab (REPATHA SURECLICK) 140 MG/ML SOAJ Inject 140 mg into  the skin every 14 (fourteen) days. 01/24/23   Laurey Morale, MD  furosemide (LASIX) 20 MG tablet Take 1 tablet (20 mg total) by mouth daily. 05/03/23   Tollie Eth, NP  glucose blood test strip Use as directed to test blood sugar 02/10/22   Early, Sung Amabile, NP  insulin glargine (LANTUS) 100 UNIT/ML Solostar Pen Inject 25-40 Units into the skin daily. 09/12/22   Tollie Eth, NP  levothyroxine (SYNTHROID) 50 MCG tablet Take 1 tablet (50 mcg total) by mouth daily, before a meal. 05/03/23   Early, Sung Amabile, NP  magnesium oxide (MAG-OX) 400 (240 Mg) MG tablet Take 1 tablet (400 mg total) by mouth daily. 01/17/22   Artis Delay, MD  metFORMIN (GLUCOPHAGE) 1000 MG tablet Take 1 tablet (1,000 mg total) by mouth 2 (two) times daily with a meal. 05/03/23   Early, Sung Amabile, NP  Multiple Vitamin (MULITIVITAMIN WITH MINERALS) TABS Take 1 tablet by mouth daily with breakfast.    [provider]  NON FORMULARY Pt uses a cpap nightly Patient not taking: Reported on 05/03/2023    [provider]  OneTouch Delica Lancets 33G MISC use as directed 01/21/21   Early, Sung Amabile, NP  sacubitril-valsartan (ENTRESTO) 24-26 MG Take 1 tablet by mouth 2 (two) times daily. 05/03/23   Tollie Eth, NP  spironolactone (ALDACTONE) 25 MG tablet Take 1 tablet (25 mg total) by mouth daily. 05/03/23   Tollie Eth, NP  tirzepatide [facility]) 2.5 MG/0.5ML Pen Inject 2.5 mg into the skin once a week. 05/03/23   Tollie Eth, NP  triamcinolone ointment (KENALOG) 0.1 % Apply 1 application topically two times daily to affected area(s) as needed, sparing use to avoid whitening/thinning skin 01/06/21   Early, Sung Amabile, NP  insulin detemir (LEVEMIR) 100 UNIT/ML FlexPen Inject 10 Units into the skin daily. 09/20/20 10/26/20  Just, Azalee Course, FNP                                                                                                                                    Past Surgical History Past Surgical History:  Procedure Laterality Date    BREATH TEK H PYLORI  09/18/2011   Procedure: BREATH TEK H PYLORI;  Surgeon: Kandis Cocking, MD;  Location: [facility];  Service: General;  Laterality: N/A;   CESAREAN SECTION     x 3   IR IMAGING GUIDED PORT INSERTION  12/07/2021   IR REMOVAL TUN ACCESS W/ PORT W/O FL MOD SED  04/04/2022  LAPAROSCOPIC APPENDECTOMY N/A 10/03/2017   Procedure: APPENDECTOMY LAPAROSCOPIC;  Surgeon: Romie Levee, MD;  Location: [facility];  Service: General;  Laterality: N/A;   OPERATIVE ULTRASOUND N/A 02/01/2022   Procedure: OPERATIVE ULTRASOUND;  Surgeon: Antony Blackbird, MD;  Location: [facility];  Service: Urology;  Laterality: N/A;   RIGHT/LEFT HEART CATH AND CORONARY ANGIOGRAPHY N/A 02/12/2018   Procedure: RIGHT/LEFT HEART CATH AND CORONARY ANGIOGRAPHY;  Surgeon: Corky Crafts, MD;  Location: [facility] INVASIVE CV LAB;  Service: Cardiovascular;  Laterality: N/A;   ROBOTIC ASSISTED TOTAL HYSTERECTOMY WITH BILATERAL SALPINGO OOPHERECTOMY Bilateral 03/21/2022   Procedure: XI ROBOTIC ASSISTED TOTAL HYSTERECTOMY WITH BILATERAL SALPINGO OOPHORECTOMY, LYSIS OF ADHESIONS, CYSTOSCOPY;  Surgeon: Carver Fila, MD;  Location: [facility];  Service: Gynecology;  Laterality: Bilateral;   TANDEM RING INSERTION N/A 02/01/2022   Procedure: TANDEM RING INSERTION;  Surgeon: Antony Blackbird, MD;  Location: [facility];  Service: Urology;  Laterality: N/A;   TUBAL LIGATION     Family History Family History  Problem Relation Age of Onset   Alcohol abuse Mother    Arthritis Mother    Diabetes Mother    Sleep apnea Mother    Anxiety disorder Mother    Eating disorder Mother    Obesity Mother    Cancer Father    Alcohol abuse Father    Heart disease Father    Hypertension Father    Stroke Father    Sleep apnea Sister    Breast cancer Neg Hx    Colon cancer Neg Hx    Ovarian cancer Neg Hx    Endometrial cancer Neg Hx    Pancreatic cancer Neg Hx    Prostate cancer Neg Hx     Social History Social History   Tobacco Use   Smoking  status: Former    Current packs/day: 0.00    Average packs/day: 1 pack/day for 15.0 years (15.0 ttl pk-yrs)    Types: Cigarettes    Start date: 01/29/1975    Quit date: 01/28/1990    Years since quitting: 33.3   Smokeless tobacco: Never  Vaping Use   Vaping status: Never Used  Substance Use Topics   Alcohol use: Not Currently   Drug use: No   Allergies Amoxicillin, Adhesive [tape], Exenatide, Invokana [canagliflozin], Jardiance [empagliflozin], Lisinopril, Other, and Statins  Review of Systems Review of Systems  Constitutional:  Positive for chills and fatigue. Negative for fever.  Gastrointestinal:  Positive for constipation and rectal pain. Negative for abdominal pain, nausea and vomiting.  Genitourinary:  Negative for dysuria.  Skin:  Positive for wound.  All other systems reviewed and are negative.   Physical Exam Vital Signs  I have reviewed the triage vital signs BP (!) 100/55   Pulse 89   Temp 98.7 F (37.1 C) (Oral)   Resp 16   SpO2 100%  Physical Exam Vitals and nursing note reviewed. Exam conducted with a chaperone present.  Constitutional:      General: She is not in acute distress.    Appearance: Normal appearance. She is obese.  HENT:     Head: Normocephalic and atraumatic.     Right Ear: External ear normal.     Left Ear: External ear normal.     Nose: Nose normal.     Mouth/Throat:     Mouth: Mucous membranes are dry.  Eyes:     General: No scleral icterus.       Right eye: No discharge.        Left  eye: No discharge.  Cardiovascular:     Rate and Rhythm: Normal rate and regular rhythm.     Pulses: Normal pulses.     Heart sounds: Normal heart sounds.  Pulmonary:     Effort: Pulmonary effort is normal. No respiratory distress.     Breath sounds: Normal breath sounds. No stridor.  Abdominal:     General: Abdomen is flat. There is no distension.     Palpations: Abdomen is soft.     Tenderness: There is no abdominal tenderness.   Genitourinary:      Comments: No crepitance, wound is draining purulent malodorous discharge Musculoskeletal:     Cervical back: No rigidity.     Right lower leg: No edema.     Left lower leg: No edema.  Skin:    General: Skin is warm and dry.     Capillary Refill: Capillary refill takes less than 2 seconds.  Neurological:     Mental Status: She is alert.  Psychiatric:        Mood and Affect: Mood normal.        Behavior: Behavior normal. Behavior is cooperative.     ED Results and Treatments Labs (all labs ordered are listed, but only abnormal results are displayed) Labs Reviewed  I-STAT CG4 LACTIC ACID, ED - Abnormal; Notable for the following components:      Result Value   Lactic Acid, Venous 2.2 (*)    All other components within normal limits  CULTURE, BLOOD (ROUTINE X 2)  CULTURE, BLOOD (ROUTINE X 2)  COMPREHENSIVE METABOLIC PANEL  CBC WITH DIFFERENTIAL/PLATELET  PROTIME-INR  APTT  URINALYSIS, W/ REFLEX TO CULTURE (INFECTION SUSPECTED)  I-STAT CG4 LACTIC ACID, ED                                                                                                                          Radiology No results found.  Pertinent labs & imaging results that were available during my care of the patient were reviewed by me and considered in my medical decision making (see MDM for details).  Medications Ordered in ED Medications  cefTRIAXone (ROCEPHIN) 2 g in sodium chloride 0.9 % 100 mL IVPB (2 g Intravenous New Bag/Given 06/07/23 1649)  vancomycin (VANCOCIN) IVPB 1000 mg/200 mL premix (has no administration in time range)  sodium chloride 0.9 % bolus 1,000 mL (1,000 mLs Intravenous New Bag/Given 06/07/23 1545)  Procedures .Critical Care  Performed by: Sloan Leiter, DO Authorized by: Sloan Leiter, DO   Critical care provider  statement:    Critical care time (minutes):  30   Critical care time was exclusive of:  Separately billable procedures and treating other patients   Critical care was necessary to treat or prevent imminent or life-threatening deterioration of the following conditions:  Sepsis   Critical care was time spent personally by me on the following activities:  Development of treatment plan with patient or surrogate, discussions with consultants, evaluation of patient's response to treatment, examination of patient, ordering and review of laboratory studies, ordering and review of radiographic studies, ordering and performing treatments and interventions, pulse oximetry, re-evaluation of patient's condition, review of old charts and obtaining history from patient or surrogate   (including critical care time)  Medical Decision Making / ED Course    Medical Decision Making:    Terri Heath is a 67 y.o. female with past medical history as below, significant for systolic heart failure, CAD, DM, obesity, hypertension, hyperlipidemia, hypothyroid, mitral regurg who presents to the ED with complaint of feeling unwell, sent from urgent care for concern of sepsis. The complaint involves an extensive differential diagnosis and also carries with it a high risk of complications and morbidity.  Serious etiology was considered. Ddx includes but is not limited to: Infectious etiology, cellulitis, metabolic derangement, UTI, dehydration, abscess, pilonidal abscess, perirectal abscess, perianal abscess, pilonidal abscess, Bartholin abscess, etc.  Complete initial physical exam performed, notably the patient  was hypotensive, appears dry on exam.    Reviewed and confirmed nursing documentation for past medical history, family history, social history.  Vital signs reviewed.        Sent from urgent care for evaluation of abscess.  Hypotensive in urgent care and tachycardic, low-grade fever urgent care.  Blood  pressure improving after getting fluids en route.  She report significant pain to her perineal area.  Some pain with defecation.  Chills but no fevers.  On exam with chaperone present she does have apparent abscess in her perineal region, does appear to track up her left labia there is an open wound with purulent drainage between her lower introitus and her rectum.  Unclear whether or not the abscess tracks into her rectum, difficult exam secondary to significant pain.  No crepitance. The labial abscess appears to be draining currently. Will get ct to evaluate depth of abscess to see if involves rectum.   Labs pending at time of shift change, lactic acid is elevated.  Blood pressure gradually improving with IV fluids.  She received 1L IVF with EMS, will give second bolus here. Concern for sepsis, send cultures, broad spectrum abx, she will likely require admission  Handoff to incoming EDP Dr Rubin Payor pending remainder of labs and imaging.                   Additional history obtained: -Additional history obtained from na -External records from outside source obtained and reviewed including: Chart review including previous notes, labs, imaging, consultation notes including  Recent urgent care eval Home meds   Lab Tests: -I ordered, reviewed, and interpreted labs.   The pertinent results include:   Labs Reviewed  I-STAT CG4 LACTIC ACID, ED - Abnormal; Notable for the following components:      Result Value   Lactic Acid, Venous 2.2 (*)    All other components within normal limits  CULTURE, BLOOD (ROUTINE X 2)  CULTURE,  BLOOD (ROUTINE X 2)  COMPREHENSIVE METABOLIC PANEL  CBC WITH DIFFERENTIAL/PLATELET  PROTIME-INR  APTT  URINALYSIS, W/ REFLEX TO CULTURE (INFECTION SUSPECTED)  I-STAT CG4 LACTIC ACID, ED    Notable for LA +, labs o/w pending  EKG   EKG Interpretation Date/Time:    Ventricular Rate:    PR Interval:    QRS Duration:    QT Interval:    QTC  Calculation:   R Axis:      Text Interpretation:           Imaging Studies ordered: I ordered imaging studies including CXR CTAP >> pending  Medicines ordered and prescription drug management: Meds ordered this encounter  Medications   sodium chloride 0.9 % bolus 1,000 mL   cefTRIAXone (ROCEPHIN) 2 g in sodium chloride 0.9 % 100 mL IVPB    Order Specific Question:   Antibiotic Indication:    Answer:   Cellulitis   vancomycin (VANCOCIN) IVPB 1000 mg/200 mL premix    Order Specific Question:   Indication:    Answer:   Cellulitis    -I have reviewed the patients home medicines and have made adjustments as needed   Consultations Obtained: na   Cardiac Monitoring: The patient was maintained on a cardiac monitor.  I personally viewed and interpreted the cardiac monitored which showed an underlying rhythm of: NSR  Social Determinants of Health:  Diagnosis or treatment significantly limited by social determinants of health: former smoker and obesity   Reevaluation: After the interventions noted above, I reevaluated the patient and found that they have improved  Co morbidities that complicate the patient evaluation  Past Medical History:  Diagnosis Date   Abnormal vaginal bleeding in postmenopausal patient 10/11/2021   Acute systolic heart failure (HCC)    Allergy    Asthma    Bilateral shoulder pain    Borderline hypertension    Bronchitis    CAD (coronary artery disease)    Chronic systolic CHF (congestive heart failure) (HCC)    Chronic systolic congestive heart failure, NYHA class 3 (HCC) 07/24/2018   Closed fracture of distal fibula 07/17/2022   Dehydration 12/20/2021   Diabetes mellitus    Diagnosed in 2000, on insulin, Trulicity and metformin, not checking cgs at home   Drug-induced hypotension 12/20/2021   Elevated serum creatinine 12/20/2021   Gangrenous appendicitis with perforation s/p lap appendectomy 10/04/2017 10/04/2017   GERD (gastroesophageal reflux  disease)    History of MI (myocardial infarction)    History of radiation therapy    Uterus- 12/14/21-02/01/22- Dr. Antony Blackbird   Hyperlipidemia    Hypertension    Hypothyroidism    MGUS (monoclonal gammopathy of unknown significance)    Mitral regurgitation    Myocardial infarction (HCC)    Neuropathy    Obesity    Rash of back 01/06/2021   Sleep apnea    uses CPAP   Statin intolerance    Uterine cancer (HCC)       Dispostion: Disposition decision including need for hospitalization was considered, and patient disposition pending at time of sign out.    Final Clinical Impression(s) / ED Diagnoses Final diagnoses:  Cellulitis of perineum  Abscess        Sloan Leiter, DO 06/07/23 1708

## 2023-06-07 NOTE — ED Provider Notes (Signed)
[facility]    CSN: [id number] Arrival date & time: 06/07/23  1240     History   Chief Complaint Chief Complaint  Patient presents with   Abscess    Entered by patient    HPI Terri Heath is a 67 y.o. female.  Patient here with 3 day history of multiple abscess Reports labial, groin, and rectal. The rectal area is most painful and started draining. Becoming more painful especially to sit She reports low grade fever at home, tmax 101  Hx DM, CHF, endometrial cancer  Past Medical History:  Diagnosis Date   Abnormal vaginal bleeding in postmenopausal patient 10/11/2021   Acute systolic heart failure (HCC)    Allergy    Asthma    Bilateral shoulder pain    Borderline hypertension    Bronchitis    CAD (coronary artery disease)    Chronic systolic CHF (congestive heart failure) (HCC)    Chronic systolic congestive heart failure, NYHA class 3 (HCC) 07/24/2018   Closed fracture of distal fibula 07/17/2022   Dehydration 12/20/2021   Diabetes mellitus    Diagnosed in 2000, on insulin, Trulicity and metformin, not checking cgs at home   Drug-induced hypotension 12/20/2021   Elevated serum creatinine 12/20/2021   Gangrenous appendicitis with perforation s/p lap appendectomy 10/04/2017 10/04/2017   GERD (gastroesophageal reflux disease)    History of MI (myocardial infarction)    History of radiation therapy    Uterus- 12/14/21-02/01/22- Dr. Antony Blackbird   Hyperlipidemia    Hypertension    Hypothyroidism    MGUS (monoclonal gammopathy of unknown significance)    Mitral regurgitation    Myocardial infarction (HCC)    Neuropathy    Obesity    Rash of back 01/06/2021   Sleep apnea    uses CPAP   Statin intolerance    Uterine cancer (HCC)     Patient Active Problem List   Diagnosis Date Noted   Statin myopathy 05/03/2023   Hypomagnesemia 01/17/2022   Pancytopenia, acquired (HCC) 01/03/2022   Diabetic peripheral neuropathy associated with type 2  diabetes mellitus (HCC) 12/02/2021   History of endometrial cancer 11/07/2021   Encounter to establish care 01/06/2021   Vitamin D deficiency 01/06/2021   Claudication in peripheral vascular disease (HCC) 09/16/2020   Chronic systolic CHF (congestive heart failure), NYHA class 3 (HCC) 07/24/2018   Acquired hypothyroidism 07/24/2018   Cardiomyopathy, ischemic 07/24/2018   MGUS (monoclonal gammopathy of unknown significance) 03/06/2018   Dyspnea 01/14/2018   Allergic rhinitis 06/11/2014   Asthma due to seasonal allergies 10/24/2012   OSA (obstructive sleep apnea) 05/23/2011   Poorly controlled type 2 diabetes mellitus with circulatory disorder (HCC) 03/26/2007   Hyperlipidemia associated with type 2 diabetes mellitus (HCC) 03/26/2007   Obesity, Class III, BMI 40-49.9 (morbid obesity) (HCC) 03/26/2007   Hypertension associated with type 2 diabetes mellitus (HCC) 03/26/2007    Past Surgical History:  Procedure Laterality Date   BREATH TEK H PYLORI  09/18/2011   Procedure: BREATH TEK H PYLORI;  Surgeon: Kandis Cocking, MD;  Location: [facility];  Service: General;  Laterality: N/A;   CESAREAN SECTION     x 3   IR IMAGING GUIDED PORT INSERTION  12/07/2021   IR REMOVAL TUN ACCESS W/ PORT W/O FL MOD SED  04/04/2022   LAPAROSCOPIC APPENDECTOMY N/A 10/03/2017   Procedure: APPENDECTOMY LAPAROSCOPIC;  Surgeon: Romie Levee, MD;  Location: [facility];  Service: General;  Laterality: N/A;   OPERATIVE ULTRASOUND N/A 02/01/2022  Procedure: OPERATIVE ULTRASOUND;  Surgeon: Antony Blackbird, MD;  Location: [facility];  Service: Urology;  Laterality: N/A;   RIGHT/LEFT HEART CATH AND CORONARY ANGIOGRAPHY N/A 02/12/2018   Procedure: RIGHT/LEFT HEART CATH AND CORONARY ANGIOGRAPHY;  Surgeon: Corky Crafts, MD;  Location: [facility];  Service: Cardiovascular;  Laterality: N/A;   ROBOTIC ASSISTED TOTAL HYSTERECTOMY WITH BILATERAL SALPINGO OOPHERECTOMY Bilateral 03/21/2022   Procedure: XI ROBOTIC ASSISTED  TOTAL HYSTERECTOMY WITH BILATERAL SALPINGO OOPHORECTOMY, LYSIS OF ADHESIONS, CYSTOSCOPY;  Surgeon: Carver Fila, MD;  Location: [facility];  Service: Gynecology;  Laterality: Bilateral;   TANDEM RING INSERTION N/A 02/01/2022   Procedure: TANDEM RING INSERTION;  Surgeon: Antony Blackbird, MD;  Location: [facility];  Service: Urology;  Laterality: N/A;   TUBAL LIGATION      OB History     Gravida  3   Para  3   Term      Preterm      AB      Living         SAB      IAB      Ectopic      Multiple      Live Births               Home Medications    Prior to Admission medications   Medication Sig Start Date End Date Taking? Authorizing Provider  albuterol (PROAIR HFA) 108 (90 Base) MCG/ACT inhaler Inhale 2 puffs into the lungs every 6 (six) hours as needed. wheezing 02/21/19   Oretha Milch, MD  aspirin 81 MG chewable tablet Chew 1 tablet (81 mg total) by mouth daily. 10/27/22   Laurey Morale, MD  blood glucose meter kit and supplies Use up to four times daily as directed. (FOR ICD-10 E10.9, E11.9). 02/10/22   Tollie Eth, NP  carvedilol (COREG) 6.25 MG tablet Take 1 tablet (6.25 mg total) by mouth 2 (two) times daily. 05/03/23   Tollie Eth, NP  Evolocumab (REPATHA SURECLICK) 140 MG/ML SOAJ Inject 140 mg into the skin every 14 (fourteen) days. 01/24/23   Laurey Morale, MD  furosemide (LASIX) 20 MG tablet Take 1 tablet (20 mg total) by mouth daily. 05/03/23   Tollie Eth, NP  glucose blood test strip Use as directed to test blood sugar 02/10/22   Early, Sung Amabile, NP  insulin glargine (LANTUS) 100 UNIT/ML Solostar Pen Inject 25-40 Units into the skin daily. 09/12/22   Tollie Eth, NP  levothyroxine (SYNTHROID) 50 MCG tablet Take 1 tablet (50 mcg total) by mouth daily, before a meal. 05/03/23   Early, Sung Amabile, NP  magnesium oxide (MAG-OX) 400 (240 Mg) MG tablet Take 1 tablet (400 mg total) by mouth daily. 01/17/22   Artis Delay, MD  metFORMIN (GLUCOPHAGE) 1000 MG tablet Take 1  tablet (1,000 mg total) by mouth 2 (two) times daily with a meal. 05/03/23   Early, Sung Amabile, NP  Multiple Vitamin (MULITIVITAMIN WITH MINERALS) TABS Take 1 tablet by mouth daily with breakfast.    [provider]  NON FORMULARY Pt uses a cpap nightly Patient not taking: Reported on 05/03/2023    [provider]  OneTouch Delica Lancets 33G MISC use as directed 01/21/21   Early, Sung Amabile, NP  sacubitril-valsartan (ENTRESTO) 24-26 MG Take 1 tablet by mouth 2 (two) times daily. 05/03/23   Tollie Eth, NP  spironolactone (ALDACTONE) 25 MG tablet Take 1 tablet (25 mg total) by mouth daily.  05/03/23   Tollie Eth, NP  tirzepatide [facility]) 2.5 MG/0.5ML Pen Inject 2.5 mg into the skin once a week. 05/03/23   Tollie Eth, NP  triamcinolone ointment (KENALOG) 0.1 % Apply 1 application topically two times daily to affected area(s) as needed, sparing use to avoid whitening/thinning skin 01/06/21   Early, Sung Amabile, NP  insulin detemir (LEVEMIR) 100 UNIT/ML FlexPen Inject 10 Units into the skin daily. 09/20/20 10/26/20  Just, Azalee Course, FNP    Family History Family History  Problem Relation Age of Onset   Alcohol abuse Mother    Arthritis Mother    Diabetes Mother    Sleep apnea Mother    Anxiety disorder Mother    Eating disorder Mother    Obesity Mother    Cancer Father    Alcohol abuse Father    Heart disease Father    Hypertension Father    Stroke Father    Sleep apnea Sister    Breast cancer Neg Hx    Colon cancer Neg Hx    Ovarian cancer Neg Hx    Endometrial cancer Neg Hx    Pancreatic cancer Neg Hx    Prostate cancer Neg Hx     Social History Social History   Tobacco Use   Smoking status: Former    Current packs/day: 0.00    Average packs/day: 1 pack/day for 15.0 years (15.0 ttl pk-yrs)    Types: Cigarettes    Start date: 01/29/1975    Quit date: 01/28/1990    Years since quitting: 33.3   Smokeless tobacco: Never  Vaping Use   Vaping status: Never Used  Substance Use  Topics   Alcohol use: Not Currently   Drug use: No     Allergies   Amoxicillin, Adhesive [tape], Exenatide, Invokana [canagliflozin], Jardiance [empagliflozin], Lisinopril, Other, and Statins   Review of Systems Review of Systems As per HPI  Physical Exam Triage Vital Signs ED Triage Vitals  Encounter Vitals Group     BP 06/07/23 1339 (!) 76/57     Systolic BP Percentile --      Diastolic BP Percentile --      Pulse Rate 06/07/23 1339 (!) 107     Resp 06/07/23 1339 19     Temp 06/07/23 1344 99.2 F (37.3 C)     Temp Source 06/07/23 1339 Axillary     SpO2 06/07/23 1339 94 %     Weight --      Height --      Head Circumference --      Peak Flow --      Pain Score 06/07/23 1337 8     Pain Loc --      Pain Education --      Exclude from Growth Chart --    No data found.  Updated Vital Signs BP (!) 85/55 (BP Location: Right Arm)   Pulse (!) 107   Temp 99.2 F (37.3 C) (Axillary)   Resp 19   SpO2 94%   Physical Exam Vitals and nursing note reviewed.  Constitutional:      General: She is not in acute distress.    Appearance: She is obese. She is ill-appearing.     Comments: Laying sideways on exam table, appears uncomfortable. Tearful   HENT:     Mouth/Throat:     Mouth: Mucous membranes are moist.     Pharynx: Oropharynx is clear.  Cardiovascular:     Rate and Rhythm: Regular rhythm. Tachycardia present.  Pulses: Normal pulses.  Pulmonary:     Effort: Pulmonary effort is normal.  Abdominal:     Comments: Habitus limits exam   Neurological:     Mental Status: She is oriented to person, place, and time.     Comments: Answers questions appropriately      UC Treatments / Results  Labs (all labs ordered are listed, but only abnormal results are displayed) Labs Reviewed  POCT FASTING CBG [facility] MANUAL ENTRY - Abnormal; Notable for the following components:      Result Value   POCT Glucose ([facility]) 233 (*)    All other components within normal limits     EKG  Radiology No results found.  Procedures Procedures   Medications Ordered in UC Medications  sodium chloride 0.9 % bolus 1,000 mL (1,000 mLs Intravenous New Bag/Given 06/07/23 1412)    Initial Impression / Assessment and Plan / UC Course  I have reviewed the triage vital signs and the nursing notes.  Pertinent labs & imaging results that were available during my care of the patient were reviewed by me and considered in my medical decision making (see chart for details).  Patient tachycardic 110 on arrival Hypotensive 73/53 Temp is 99.2  CBG 223  She is ill-appearing and uncomfortable on exam table Concern for sepsis with abscess as possible sourc Requires higher level of care  IV placed left AC, fluids started Recheck BP 85/55  [facility] called for transport to hospital. Requires higher level of care  Final Clinical Impressions(s) / UC Diagnoses   Final diagnoses:  Hypotension, unspecified hypotension type  Rectal abscess     Discharge Instructions      Sent to ED via [facility]    ED Prescriptions   None    PDMP not reviewed this encounter.   Marlow Baars, [place] 06/07/23 1448

## 2023-06-07 NOTE — ED Provider Notes (Signed)
  Physical Exam  BP 110/64   Pulse 97   Temp 97.6 F (36.4 C) (Oral)   Resp 20   Ht 5\' 7"  (1.702 m)   Wt 132.5 kg   SpO2 100%   BMI 45.73 kg/m   Physical Exam  Procedures  Procedures  ED Course / MDM    Medical Decision Making Amount and/or Complexity of Data Reviewed Labs: ordered. Radiology: ordered.  Risk Prescription drug management. Decision regarding hospitalization.   Received in signout.  Perineal abscess.  Recurrent hypotension.  Freely draining.  Lactic acid reassuring and has had initially 2 L of fluid and then developed hypotension again.  Got third liter.  CT scan independent interpreted and does not appear to involve the whole peritoneal.  Discussed with radiology and will get more images.  Discussed with radiology tech.  CT scan shows no large abscess but just a small fluid collection.  Discussed with Dr.Anyanwu from gynecology.  States that only antibiotics at this time.  Patient's primary gynecologist is Dr. Hyacinth Meeker.  Dr. Hyacinth Meeker can be consulted tomorrow because they see the wrong patient.  Admission to medicine at this time.       Benjiman Core, MD 06/07/23 2050

## 2023-06-07 NOTE — ED Notes (Signed)
Patient is being discharged from the Urgent Care and sent to the Emergency Department via [facility] . Per [employer], PA, patient is in need of higher level of care due to low BP. Patient is aware and verbalizes understanding of plan of care.  Vitals:   06/07/23 1339 06/07/23 1344  BP: (!) 76/57 (!) 73/53  Pulse: (!) 107   Resp: 19   Temp:  99.2 F (37.3 C)  SpO2: 94%

## 2023-06-07 NOTE — ED Triage Notes (Signed)
Pt has 3 abscesses. She states on is near labia, one near pelvis, and one near rectum. The abscess near rectum is painful and has brown discharge that started on Monday.

## 2023-06-07 NOTE — Progress Notes (Signed)
ED Pharmacy Antibiotic Sign Off An antibiotic consult was received from an ED provider for vancomycin per pharmacy dosing for sepsis and cellulitis. A chart review was completed to assess appropriateness.  The following one time order(s) were placed per pharmacy consult:  vancomycin 2500 mg IV x 1 dose   Further antibiotic and/or antibiotic pharmacy consults should be ordered by the admitting provider if indicated.   Thank you for allowing pharmacy to be a part of this patient's care.   Stephenie Acres, PharmD PGY1 Pharmacy Resident 06/07/2023 5:37 PM

## 2023-06-07 NOTE — Progress Notes (Signed)
Because of area of the boil and concern for more substantial infection due to fever, I feel your condition warrants further evaluation and I recommend that you be seen in a face to face visit.   NOTE: There will be NO CHARGE for this eVisit   If you are having a true medical emergency please call 911.      For an urgent face to face visit, [place] has eight urgent care centers for your convenience:   NEW!! [facility] at [facility] Get Driving Directions [phone] [address] [place], [address]    [facility] at [facility] Get Driving Directions [phone] [address] [place], [place] [address]   [facility]) Get Driving Directions [phone] [address] [place], [place] [address]  [facility]) Get Driving Directions [phone] [address] [place],  [place]  [address]  [facility]gent Care Center [facility] at [employer]  [phone] [address] [place],  [place] [address]   [facility] Care at [facility] Get Driving Directions [phone] [address] [place], [place] [address]   [facility] Urgent Care at [facility] Get Driving Directions  [phone] [address] [place], [place] [address]   [facility] Health Urgent Care at [facility] Directions [phone] [address], [address] [place], [place] [address]  Your MyChart E-visit questionnaire answers were reviewed by a board certified advanced clinical practitioner to complete your personal care plan based on your specific symptoms.  Thank you for using e-Visits.

## 2023-06-07 NOTE — ED Notes (Signed)
When reviewing chart from [facility] I see that she was hypotensive in 70's and 80.s BP improved at this time, pt had majority of her NS 1l Bolus

## 2023-06-07 NOTE — ED Notes (Signed)
 Charge notified of patient coming via [facility] when truck available.

## 2023-06-07 NOTE — ED Notes (Signed)
[facility] notified of transport to [facility] ED.

## 2023-06-07 NOTE — ED Triage Notes (Signed)
Pt was seen at [facility] due to abscess which began Monday.  Pt describes 3 abscesses in her perineal area, all 3 with malodorous drainage.  No fever.  Pt has IV in placed and is receiving 1L NS via PIV.

## 2023-06-07 NOTE — Sepsis Progress Note (Signed)
[facility] is following this Code Sepsis. °

## 2023-06-08 DIAGNOSIS — N764 Abscess of vulva: Secondary | ICD-10-CM | POA: Diagnosis not present

## 2023-06-08 LAB — CBC
HCT: 29.7 % — ABNORMAL LOW (ref 36.0–46.0)
Hemoglobin: 9.4 g/dL — ABNORMAL LOW (ref 12.0–15.0)
MCH: 27.9 pg (ref 26.0–34.0)
MCHC: 31.6 g/dL (ref 30.0–36.0)
MCV: 88.1 fL (ref 80.0–100.0)
Platelets: 175 10*3/uL (ref 150–400)
RBC: 3.37 MIL/uL — ABNORMAL LOW (ref 3.87–5.11)
RDW: 14 % (ref 11.5–15.5)
WBC: 18.1 10*3/uL — ABNORMAL HIGH (ref 4.0–10.5)
nRBC: 0 % (ref 0.0–0.2)

## 2023-06-08 LAB — BASIC METABOLIC PANEL
Anion gap: 13 (ref 5–15)
BUN: 28 mg/dL — ABNORMAL HIGH (ref 8–23)
CO2: 18 mmol/L — ABNORMAL LOW (ref 22–32)
Calcium: 8.3 mg/dL — ABNORMAL LOW (ref 8.9–10.3)
Chloride: 105 mmol/L (ref 98–111)
Creatinine, Ser: 1.25 mg/dL — ABNORMAL HIGH (ref 0.44–1.00)
GFR, Estimated: 48 mL/min — ABNORMAL LOW (ref >60)
Glucose, Bld: 215 mg/dL — ABNORMAL HIGH (ref 70–99)
Potassium: 4.3 mmol/L (ref 3.5–5.1)
Sodium: 136 mmol/L (ref 135–145)

## 2023-06-08 LAB — GLUCOSE, CAPILLARY
Glucose-Capillary: 260 mg/dL — ABNORMAL HIGH (ref 70–99)
Glucose-Capillary: 227 mg/dL — ABNORMAL HIGH (ref 70–99)

## 2023-06-08 LAB — MAGNESIUM: Magnesium: 1.8 mg/dL (ref 1.7–2.4)

## 2023-06-08 LAB — CBG MONITORING, ED
Glucose-Capillary: 202 mg/dL — ABNORMAL HIGH (ref 70–99)
Glucose-Capillary: 173 mg/dL — ABNORMAL HIGH (ref 70–99)
Glucose-Capillary: 190 mg/dL — ABNORMAL HIGH (ref 70–99)

## 2023-06-08 LAB — PROTIME-INR
INR: 1.3 — ABNORMAL HIGH (ref 0.8–1.2)
Prothrombin Time: 16.1 s — ABNORMAL HIGH (ref 11.4–15.2)

## 2023-06-08 LAB — PHOSPHORUS: Phosphorus: 2.5 mg/dL (ref 2.5–4.6)

## 2023-06-08 LAB — MRSA NEXT GEN BY PCR, NASAL: MRSA by PCR Next Gen: NOT DETECTED

## 2023-06-08 LAB — HIV ANTIBODY (ROUTINE TESTING W REFLEX): HIV Screen 4th Generation wRfx: NONREACTIVE

## 2023-06-08 MED ORDER — OXYCODONE HCL 5 MG PO TABS
5.0000 mg | ORAL_TABLET | Freq: Four times a day (QID) | ORAL | Status: DC | PRN
  Administered 2023-06-08 – 2023-06-10 (×6): 5 mg via ORAL
  Filled 2023-06-08 (×6): qty 1

## 2023-06-08 MED ORDER — VANCOMYCIN HCL 1500 MG/300ML IV SOLN: 1500.0000 mg | INTRAVENOUS | Status: DC

## 2023-06-08 MED ORDER — PIPERACILLIN-TAZOBACTAM 3.375 G IVPB
3.3750 g | Freq: Three times a day (TID) | INTRAVENOUS | Status: DC
  Administered 2023-06-08 – 2023-06-13 (×15): 3.375 g via INTRAVENOUS
  Filled 2023-06-08 (×15): qty 50

## 2023-06-08 MED ORDER — OXYCODONE HCL 5 MG PO TABS
2.5000 mg | ORAL_TABLET | Freq: Four times a day (QID) | ORAL | Status: DC | PRN
  Administered 2023-06-09: 2.5 mg via ORAL
  Filled 2023-06-08: qty 1

## 2023-06-08 MED ORDER — SILVER SULFADIAZINE 1 % EX CREA
TOPICAL_CREAM | Freq: Three times a day (TID) | CUTANEOUS | Status: DC
  Administered 2023-06-14: 1 via TOPICAL
  Filled 2023-06-08 (×2): qty 85

## 2023-06-08 MED ORDER — VANCOMYCIN HCL 1250 MG/250ML IV SOLN
1250.0000 mg | INTRAVENOUS | Status: DC
  Administered 2023-06-08 – 2023-06-11 (×4): 1250 mg via INTRAVENOUS
  Filled 2023-06-08 (×4): qty 250

## 2023-06-08 MED ORDER — PIPERACILLIN-TAZOBACTAM 3.375 G IVPB 30 MIN: 3.3750 g | Freq: Three times a day (TID) | INTRAVENOUS | Status: DC

## 2023-06-08 NOTE — Plan of Care (Signed)

## 2023-06-08 NOTE — Progress Notes (Signed)
PROGRESS NOTE  Terri Heath ZOX:[id number] DOB: 07-09-1956 DOA: 06/07/2023 PCP: Tollie Eth, NP   LOS: 1 day   Brief Narrative / Interim history: 67 year old female with history of recurrent labial abscess, DM2, morbid obesity, endometrial cancer s/p surgery/chemoradiation, chronic CHF with improved EF, CAD, PAD, MGUS, HTN, HLD who comes into the hospital with few days of drainage from the labial abscess.  She reports significant pain in that area.  This has been extremely malodorous.  She has had some fever and chills, being lightheaded at home.  In the ED she was found to be septic, received few IV boluses and OB/GYN was consulted  Subjective / 24h Interval events: Doing better this morning, no longer lightheaded.  Denies any fever or chills this morning  Assesement and Plan: Principal problem Sepsis due to left labial, concern for perineal abscess - spontaneously draining.  Very tender to palpation.  OB/GYN apparently consulted last night, however Dr. Hyacinth Meeker who was on-call for her group has never received a call.  Dr. Olena Leatherwood to see this morning -For now continue broad-spectrum antibiotics, blood pressure is stable and she seems to have responded to IV fluids  Active problems AKI-baseline creatinine 0.9, elevated to 1.5 on admission.  Suspect in the setting of sepsis.  DM2, with hyperglycemia, uncontrolled-A1c 9.3 last month.  Continue insulin, sliding scale  Hypothyroidism-continue Synthroid  Left basilar density on chest x-ray-likely incidental, will repeat chest x-ray in a few weeks as an outpatient  Chronic combined CHF-recently EF has improved.  Seeing heart failure surgery.  Hold home medications due to hypotension  CAD, PAD-continue on aspirin.  She is on Repatha as an outpatient  OSA-CPAP nightly  History of endometrial cancer status post surgery, chemoradiation-outpatient follow-up with Dr. Pricilla Holm  Scheduled Meds:  aspirin  81 mg Oral Daily   insulin aspart  0-15  Units Subcutaneous TID WC   insulin aspart  0-5 Units Subcutaneous QHS   insulin glargine-yfgn  25 Units Subcutaneous Daily   levothyroxine  50 mcg Oral Q0600   metroNIDAZOLE  500 mg Oral Q8H   sodium chloride flush  3 mL Intravenous Q12H   Continuous Infusions:  cefTRIAXone (ROCEPHIN)  IV     vancomycin     PRN Meds:.acetaminophen, albuterol, oxyCODONE **OR** oxyCODONE, polyethylene glycol  Current Outpatient Medications  Medication Instructions   acetaminophen (TYLENOL) 500-1,000 mg, Oral, Every 6 hours PRN   albuterol (PROAIR HFA) 108 (90 Base) MCG/ACT inhaler 2 puffs, Inhalation, Every 6 hours PRN, wheezing   aspirin 81 mg, Oral, Daily   blood glucose meter kit and supplies Use up to four times daily as directed. (FOR ICD-10 E10.9, E11.9).   carvedilol (COREG) 6.25 mg, Oral, 2 times daily   furosemide (LASIX) 20 mg, Oral, Daily   glucose blood test strip Use as directed to test blood sugar   Lantus SoloStar 25-40 Units, Subcutaneous, Daily   levothyroxine (SYNTHROID) 50 MCG tablet Take 1 tablet (50 mcg total) by mouth daily, before a meal.   magnesium oxide (MAG-OX) 400 mg, Oral, Daily   metFORMIN (GLUCOPHAGE) 1,000 mg, Oral, 2 times daily with meals   Mounjaro 2.5 mg, Subcutaneous, Weekly   Multiple Vitamin (MULITIVITAMIN WITH MINERALS) TABS 1 tablet, Oral, Daily with breakfast   naproxen sodium (ALEVE) 440 mg, Oral, Daily PRN   OneTouch Delica Lancets 33G MISC use as directed   Repatha SureClick 140 mg, Subcutaneous, Every 14 days   sacubitril-valsartan (ENTRESTO) 24-26 MG 1 tablet, Oral, 2 times daily   spironolactone (  ALDACTONE) 25 mg, Oral, Daily   triamcinolone ointment (KENALOG) 0.1 % Apply 1 application topically two times daily to affected area(s) as needed, sparing use to avoid whitening/thinning skin    Diet Orders (From admission, onward)     Start     Ordered   06/08/23 0829  Diet NPO time specified  Diet effective now        06/08/23 0828             DVT prophylaxis: SCDs Start: 06/07/23 2228   Lab Results  Component Value Date   PLT 175 06/08/2023      Code Status: Full Code  Family Communication: no family at bedside   Status is: Inpatient Remains inpatient appropriate because: severity of illness  Level of care: Progressive  Consultants:  Gyn  Objective: Vitals:   06/08/23 0315 06/08/23 0600 06/08/23 0700 06/08/23 0903  BP: 111/80 (!) 104/57 (!) 104/58 101/62  Pulse: (!) 108 (!) 104 (!) 102 94  Resp:  (!) 30 17 20   Temp:    97.7 F (36.5 C)  TempSrc:    Oral  SpO2: 93%  97% 96%  Weight:      Height:       No intake or output data in the 24 hours ending 06/08/23 0940 Wt Readings from Last 3 Encounters:  06/07/23 132.5 kg  05/03/23 133.8 kg  02/06/23 133.8 kg    Examination:  Constitutional: NAD Eyes: no scleral icterus ENMT: Mucous membranes are moist.  Neck: normal, supple Respiratory: clear to auscultation bilaterally, no wheezing, no crackles. Normal respiratory effort.  Cardiovascular: Regular rate and rhythm, no murmurs / rubs / gallops. 1+ LE edema.  Abdomen: non distended, no tenderness. Bowel sounds positive.  Musculoskeletal: no clubbing / cyanosis.  Skin: no rashes Neurologic: non focal   Data Reviewed: I have independently reviewed following labs and imaging studies   CBC Recent Labs  Lab 06/07/23 1544 06/08/23 0427  WBC 17.7* 18.1*  HGB 11.0* 9.4*  HCT 34.5* 29.7*  PLT 123* 175  MCV 91.3 88.1  MCH 29.1 27.9  MCHC 31.9 31.6  RDW 13.8 14.0  LYMPHSABS 0.8  --   MONOABS 1.2*  --   EOSABS 0.0  --   BASOSABS 0.1  --     Recent Labs  Lab 06/07/23 1544 06/07/23 1622 06/07/23 1754 06/08/23 0427  NA 134*  --   --  136  K 4.5  --   --  4.3  CL 101  --   --  105  CO2 20*  --   --  18*  GLUCOSE 224*  --   --  215*  BUN 30*  --   --  28*  CREATININE 1.50*  --   --  1.25*  CALCIUM 8.6*  --   --  8.3*  AST 13*  --   --   --   ALT 10  --   --   --   ALKPHOS 105  --   --    --   BILITOT 0.6  --   --   --   ALBUMIN 2.5*  --   --   --   MG  --   --   --  1.8  LATICACIDVEN  --  2.2* 1.2  --   INR 1.2  --   --  1.3*    ------------------------------------------------------------------------------------------------------------------ No results for input(s): "CHOL", "HDL", "LDLCALC", "TRIG", "CHOLHDL", "LDLDIRECT" in the last 72 hours.  Lab Results  Component Value Date   HGBA1C 9.3 (H) 05/03/2023   ------------------------------------------------------------------------------------------------------------------ No results for input(s): "TSH", "T4TOTAL", "T3FREE", "THYROIDAB" in the last 72 hours.  Invalid input(s): "FREET3"  Cardiac Enzymes No results for input(s): "CKMB", "TROPONINI", "MYOGLOBIN" in the last 168 hours.  Invalid input(s): "CK" ------------------------------------------------------------------------------------------------------------------    Component Value Date/Time   BNP 33.4 08/03/2022 1506    CBG: Recent Labs  Lab 06/08/23 0119 06/08/23 0905  GLUCAP 202* 173*    Recent Results (from the past 240 hour(s))  Body fluid culture w Gram Stain     Status: None (Preliminary result)   Collection Time: 06/08/23  1:09 AM   Specimen: Abscess; Body Fluid  Result Value Ref Range Status   Specimen Description ABSCESS  Final   Special Requests NONE  Final   Gram Stain   Final    FEW WBC PRESENT, PREDOMINANTLY MONONUCLEAR MODERATE GRAM NEGATIVE RODS FEW GRAM POSITIVE COCCI IN CLUSTERS Performed at [facility], [address], [place], [place] [address]    Culture PENDING  Incomplete   Report Status PENDING  Incomplete     Radiology Studies: CT ABDOMEN PELVIS W CONTRAST  Result Date: 06/07/2023 CLINICAL DATA:  Peroneal abscess. Patient has 3 abscesses 1 near labia, 1 new pelvis, 1 year rectum. The abscess near the rectum is painful and has brown discharge that started on Monday. EXAM: CT ABDOMEN AND PELVIS WITH CONTRAST  TECHNIQUE: Multidetector CT imaging of the abdomen and pelvis was performed using the standard protocol following bolus administration of intravenous contrast. RADIATION DOSE REDUCTION: This exam was performed according to the departmental dose-optimization program which includes automated exposure control, adjustment of the mA and/or kV according to patient size and/or use of iterative reconstruction technique. CONTRAST:  65mL OMNIPAQUE IOHEXOL 350 MG/ML SOLN COMPARISON:  CT abdomen and pelvis 09/04/2022 FINDINGS: Lower chest: No acute abnormality. Hepatobiliary: Unremarkable liver. Normal gallbladder. No biliary dilation. Pancreas: Unremarkable. Spleen: Unremarkable. Adrenals/Urinary Tract: Normal adrenal glands. No urinary calculi or hydronephrosis. Bladder is unremarkable. Stomach/Bowel: Normal caliber large and small bowel. Colonic diverticulosis without diverticulitis. The appendix is not visualized.Stomach is within normal limits. Vascular/Lymphatic: Aortic atherosclerosis. No enlarged abdominal or pelvic lymph nodes. Reproductive: Status post hysterectomy. No adnexal masses. Other: No free intraperitoneal fluid or air. Moderate stranding and edema within the left labia. Small 1.4 cm focus of soft tissue thickening along the medial left labia with tiny focus of gas (series 12/image 126). The inflammatory stranding extends superiorly into the mons pubis. No drainable fluid collection. No evidence of deep tissue infection. Musculoskeletal: No acute fracture. Chronic fractures of the right inferior superior pubic rami. Sclerosis about the left-greater-than-right SI joints. IMPRESSION: Inflammatory stranding and edema within the left labia extending into the mons pubis. Small 1.4 cm focus of soft tissue thickening along the left labia with tiny focus of gas compatible with phlegmon. No drainable fluid collection. Aortic Atherosclerosis (ICD10-I70.0). Electronically Signed   By: Minerva Fester M.D.   On:  06/07/2023 20:14   DG Chest Port 1 View  Result Date: 06/07/2023 CLINICAL DATA:  Questionable sepsis EXAM: PORTABLE CHEST 1 VIEW COMPARISON:  Chest x-ray dated Jan 16, 2018 FINDINGS: The heart size and mediastinal contours are within normal limits. New mild opacity of the left costophrenic angle. Both lungs are otherwise clear. The visualized skeletal structures are unremarkable. IMPRESSION: New mild opacity of the left costophrenic angle, which may represent atelectasis, infection or aspiration can not be excluded. Recommend 6-8 week follow-up with PA and lateral chest  radiograph to ensure resolution. Electronically Signed   By: Allegra Lai M.D.   On: 06/07/2023 17:17     Terri Pert, MD, PhD [facility]  Between 7 am - 7 pm I am available, please contact me via Amion (for emergencies) or Securechat (non urgent messages)  Between 7 pm - 7 am I am not available, please contact night coverage MD/APP via Amion

## 2023-06-08 NOTE — ED Notes (Signed)
OB/GYN at bedside. Pt ok to eat. Will be changing the ordered abx.

## 2023-06-08 NOTE — ED Notes (Addendum)
ED TO INPATIENT HANDOFF REPORT  ED Nurse Name and Phone #: Dahlia Client [phone]  S Name/Age/Gender Terri Heath 67 y.o. female Room/Bed: 005C/005C  Code Status   Code Status: Full Code  Home/SNF/Other Home Patient oriented to: self, place, time, and situation Is this baseline? Yes   Triage Complete: Triage complete  Chief Complaint Labial abscess [N76.4]  Triage Note Pt was seen at [facility] due to abscess which began Monday.  Pt describes 3 abscesses in her perineal area, all 3 with malodorous drainage.  No fever.  Pt has IV in placed and is receiving 1L NS via PIV.    Allergies Allergies  Allergen Reactions   Amoxicillin Other (See Comments)    Dizziness, abdominal pain   Adhesive [Tape] Rash    Tegaderm   Exenatide Other (See Comments)    Flu-like symptoms   Invokana [Canagliflozin] Other (See Comments)    Yeast infection/dehydration   Jardiance [Empagliflozin] Other (See Comments)    Yeast infections and dehydration   Lisinopril Other (See Comments)    cramping   Other Diarrhea and Other (See Comments)    Kale=food  Per patient, it causes GI bleeding    Statins Other (See Comments)    Leg cramps    Level of Care/Admitting Diagnosis ED Disposition     ED Disposition  Admit   Condition  --   Comment  Hospital Area: [facility] [[id number]]  Level of Care: Progressive [102]  Admit to Progressive based on following criteria: MULTISYSTEM THREATS such as stable sepsis, metabolic/electrolyte imbalance with or without encephalopathy that is responding to early treatment.  May admit patient to [facility] or [facility] if equivalent level of care is available:: No  Covid Evaluation: Asymptomatic - no recent exposure (last 10 days) testing not required  Diagnosis: Labial abscess [[id number]]  Admitting Physician: Dolly Rias [[id number]]  Attending Physician: Dolly Rias [[id number]]  Certification:: I certify this patient will need inpatient  services for at least 2 midnights  Expected Medical Readiness: 06/09/2023          B Medical/Surgery History Past Medical History:  Diagnosis Date   Abnormal vaginal bleeding in postmenopausal patient 10/11/2021   Acute systolic heart failure (HCC)    Allergy    Asthma    Bilateral shoulder pain    Borderline hypertension    Bronchitis    CAD (coronary artery disease)    Chronic systolic CHF (congestive heart failure) (HCC)    Chronic systolic congestive heart failure, NYHA class 3 (HCC) 07/24/2018   Closed fracture of distal fibula 07/17/2022   Dehydration 12/20/2021   Diabetes mellitus    Diagnosed in 2000, on insulin, Trulicity and metformin, not checking cgs at home   Drug-induced hypotension 12/20/2021   Elevated serum creatinine 12/20/2021   Gangrenous appendicitis with perforation s/p lap appendectomy 10/04/2017 10/04/2017   GERD (gastroesophageal reflux disease)    History of MI (myocardial infarction)    History of radiation therapy    Uterus- 12/14/21-02/01/22- Dr. Antony Blackbird   Hyperlipidemia    Hypertension    Hypothyroidism    MGUS (monoclonal gammopathy of unknown significance)    Mitral regurgitation    Myocardial infarction (HCC)    Neuropathy    Obesity    Rash of back 01/06/2021   Sleep apnea    uses CPAP   Statin intolerance    Uterine cancer (HCC)    Past Surgical History:  Procedure Laterality Date   BREATH TEK H PYLORI  09/18/2011   Procedure: BREATH TEK H PYLORI;  Surgeon: Kandis Cocking, MD;  Location: [facility];  Service: General;  Laterality: N/A;   CESAREAN SECTION     x 3   IR IMAGING GUIDED PORT INSERTION  12/07/2021   IR REMOVAL TUN ACCESS W/ PORT W/O FL MOD SED  04/04/2022   LAPAROSCOPIC APPENDECTOMY N/A 10/03/2017   Procedure: APPENDECTOMY LAPAROSCOPIC;  Surgeon: Romie Levee, MD;  Location: [facility];  Service: General;  Laterality: N/A;   OPERATIVE ULTRASOUND N/A 02/01/2022   Procedure: OPERATIVE ULTRASOUND;  Surgeon: Antony Blackbird,  MD;  Location: [facility];  Service: Urology;  Laterality: N/A;   RIGHT/LEFT HEART CATH AND CORONARY ANGIOGRAPHY N/A 02/12/2018   Procedure: RIGHT/LEFT HEART CATH AND CORONARY ANGIOGRAPHY;  Surgeon: Corky Crafts, MD;  Location: [facility];  Service: Cardiovascular;  Laterality: N/A;   ROBOTIC ASSISTED TOTAL HYSTERECTOMY WITH BILATERAL SALPINGO OOPHERECTOMY Bilateral 03/21/2022   Procedure: XI ROBOTIC ASSISTED TOTAL HYSTERECTOMY WITH BILATERAL SALPINGO OOPHORECTOMY, LYSIS OF ADHESIONS, CYSTOSCOPY;  Surgeon: Carver Fila, MD;  Location: [facility];  Service: Gynecology;  Laterality: Bilateral;   TANDEM RING INSERTION N/A 02/01/2022   Procedure: TANDEM RING INSERTION;  Surgeon: Antony Blackbird, MD;  Location: [facility];  Service: Urology;  Laterality: N/A;   TUBAL LIGATION       A IV Location/Drains/Wounds Patient Lines/Drains/Airways Status     Active Line/Drains/Airways     Name Placement date Placement time Site Days   Peripheral IV Anterior;Left;Proximal Forearm --  --  Forearm  --   Peripheral IV 06/07/23 22 G Anterior;Left Forearm 06/07/23  1457  Forearm  1   Peripheral IV 06/07/23 20 G Right Antecubital 06/07/23  1545  Antecubital  1   Closed System Drain 1 Anterior;Left;Ventral Abdomen Bulb (JP) 10/04/17  0015  Abdomen  2073   NG/OG Vented/Dual Lumen 18 Fr. Oral Marking at nare/corner of mouth 03/21/22  1206  Oral  444   Incision - 3 Ports Abdomen 1: Mid 2: Left;Upper 3: Left;Lower 10/03/17  2349  -- 2074   Incision - 5 Ports Abdomen 1: Right;Lateral 2: Right;Lateral;Mid 3: Umbilicus;Superior 4: Left;Lateral;Upper 5: Left;Lateral;Mid 03/21/22  1235  -- 444            Intake/Output Last 24 hours No intake or output data in the 24 hours ending 06/08/23 0946  Labs/Imaging Results for orders placed or performed during the hospital encounter of 06/07/23 (from the past 48 hour(s))  Comprehensive metabolic panel     Status: Abnormal   Collection Time: 06/07/23  3:44 PM   Result Value Ref Range   Sodium 134 (L) 135 - 145 mmol/L   Potassium 4.5 3.5 - 5.1 mmol/L   Chloride 101 98 - 111 mmol/L   CO2 20 (L) 22 - 32 mmol/L   Glucose, Bld 224 (H) 70 - 99 mg/dL    Comment: Glucose reference range applies only to samples taken after fasting for at least 8 hours.   BUN 30 (H) 8 - 23 mg/dL   Creatinine, Ser 7.42 (H) 0.44 - 1.00 mg/dL   Calcium 8.6 (L) 8.9 - 10.3 mg/dL   Total Protein 7.3 6.5 - 8.1 g/dL   Albumin 2.5 (L) 3.5 - 5.0 g/dL   AST 13 (L) 15 - 41 U/L   ALT 10 0 - 44 U/L   Alkaline Phosphatase 105 38 - 126 U/L   Total Bilirubin 0.6 0.3 - 1.2 mg/dL   GFR, Estimated 38 (L) >60 mL/min  Comment: (NOTE) Calculated using the CKD-EPI Creatinine Equation (2021)    Anion gap 13 5 - 15    Comment: Performed at [facility], [address], [place], [place] [address]  CBC with Differential     Status: Abnormal   Collection Time: 06/07/23  3:44 PM  Result Value Ref Range   WBC 17.7 (H) 4.0 - 10.5 K/uL   RBC 3.78 (L) 3.87 - 5.11 MIL/uL   Hemoglobin 11.0 (L) 12.0 - 15.0 g/dL   HCT 46.9 (L) 62.9 - 52.8 %   MCV 91.3 80.0 - 100.0 fL   MCH 29.1 26.0 - 34.0 pg   MCHC 31.9 30.0 - 36.0 g/dL   RDW 41.3 24.4 - 01.0 %   Platelets 123 (L) 150 - 400 K/uL   nRBC 0.0 0.0 - 0.2 %   Neutrophils Relative % 88 %   Neutro Abs 15.5 (H) 1.7 - 7.7 K/uL   Lymphocytes Relative 4 %   Lymphs Abs 0.8 0.7 - 4.0 K/uL   Monocytes Relative 7 %   Monocytes Absolute 1.2 (H) 0.1 - 1.0 K/uL   Eosinophils Relative 0 %   Eosinophils Absolute 0.0 0.0 - 0.5 K/uL   Basophils Relative 0 %   Basophils Absolute 0.1 0.0 - 0.1 K/uL   Immature Granulocytes 1 %   Abs Immature Granulocytes 0.16 (H) 0.00 - 0.07 K/uL    Comment: Performed at [facility], [address], [place], [place] [address]  Protime-INR     Status: Abnormal   Collection Time: 06/07/23  3:44 PM  Result Value Ref Range   Prothrombin Time 15.8 (H) 11.4 - 15.2 seconds   INR 1.2 0.8 - 1.2    Comment: (NOTE) INR  goal varies based on device and disease states. Performed at [facility], [address], [place], [place] [address]   APTT     Status: None   Collection Time: 06/07/23  3:44 PM  Result Value Ref Range   aPTT 29 24 - 36 seconds    Comment: Performed at [facility], [address] [place], [place] [address]  I-Stat Lactic Acid, ED     Status: Abnormal   Collection Time: 06/07/23  4:22 PM  Result Value Ref Range   Lactic Acid, Venous 2.2 (HH) 0.5 - 1.9 mmol/L   Comment NOTIFIED PHYSICIAN   I-Stat Lactic Acid, ED     Status: None   Collection Time: 06/07/23  5:54 PM  Result Value Ref Range   Lactic Acid, Venous 1.2 0.5 - 1.9 mmol/L  Urinalysis, w/ Reflex to Culture (Infection Suspected) -Urine, Clean Catch     Status: Abnormal   Collection Time: 06/07/23 10:49 PM  Result Value Ref Range   Specimen Source URINE, CLEAN CATCH    Color, Urine YELLOW YELLOW   APPearance CLOUDY (A) CLEAR   Specific Gravity, Urine >1.046 (H) 1.005 - 1.030   pH 5.0 5.0 - 8.0   Glucose, UA NEGATIVE NEGATIVE mg/dL   Hgb urine dipstick MODERATE (A) NEGATIVE   Bilirubin Urine NEGATIVE NEGATIVE   Ketones, ur 5 (A) NEGATIVE mg/dL   Protein, ur 30 (A) NEGATIVE mg/dL   Nitrite NEGATIVE NEGATIVE   Leukocytes,Ua LARGE (A) NEGATIVE   RBC / HPF 21-50 0 - 5 RBC/hpf   WBC, UA >50 0 - 5 WBC/hpf    Comment:        Reflex urine culture not performed if WBC <=10, OR if Squamous epithelial cells >  5. If Squamous epithelial cells >5 suggest recollection.    Bacteria, UA RARE (A) NONE SEEN   Squamous Epithelial / HPF 11-20 0 - 5 /HPF   Mucus PRESENT     Comment: Performed at [facility], [address], [place], [place] [address]  Body fluid culture w Gram Stain     Status: None (Preliminary result)   Collection Time: 06/08/23  1:09 AM   Specimen: Abscess; Body Fluid  Result Value Ref Range   Specimen Description ABSCESS    Special Requests NONE    Gram Stain      FEW WBC PRESENT,  PREDOMINANTLY MONONUCLEAR MODERATE GRAM NEGATIVE RODS FEW GRAM POSITIVE COCCI IN CLUSTERS Performed at [facility], [address] [place], [place] [address]    Culture PENDING    Report Status PENDING   CBG monitoring, ED     Status: Abnormal   Collection Time: 06/08/23  1:19 AM  Result Value Ref Range   Glucose-Capillary 202 (H) 70 - 99 mg/dL    Comment: Glucose reference range applies only to samples taken after fasting for at least 8 hours.  Basic metabolic panel     Status: Abnormal   Collection Time: 06/08/23  4:27 AM  Result Value Ref Range   Sodium 136 135 - 145 mmol/L   Potassium 4.3 3.5 - 5.1 mmol/L   Chloride 105 98 - 111 mmol/L   CO2 18 (L) 22 - 32 mmol/L   Glucose, Bld 215 (H) 70 - 99 mg/dL    Comment: Glucose reference range applies only to samples taken after fasting for at least 8 hours.   BUN 28 (H) 8 - 23 mg/dL   Creatinine, Ser 0.98 (H) 0.44 - 1.00 mg/dL   Calcium 8.3 (L) 8.9 - 10.3 mg/dL   GFR, Estimated 48 (L) >60 mL/min    Comment: (NOTE) Calculated using the CKD-EPI Creatinine Equation (2021)    Anion gap 13 5 - 15    Comment: Performed at [facility], [address], [place], [place] [address]  CBC     Status: Abnormal   Collection Time: 06/08/23  4:27 AM  Result Value Ref Range   WBC 18.1 (H) 4.0 - 10.5 K/uL   RBC 3.37 (L) 3.87 - 5.11 MIL/uL   Hemoglobin 9.4 (L) 12.0 - 15.0 g/dL   HCT 78.2 (L) 95.6 - 21.3 %   MCV 88.1 80.0 - 100.0 fL   MCH 27.9 26.0 - 34.0 pg   MCHC 31.6 30.0 - 36.0 g/dL   RDW 08.6 57.8 - 46.9 %   Platelets 175 150 - 400 K/uL   nRBC 0.0 0.0 - 0.2 %    Comment: Performed at [facility], [address], [place], [place] [address]  Magnesium     Status: None   Collection Time: 06/08/23  4:27 AM  Result Value Ref Range   Magnesium 1.8 1.7 - 2.4 mg/dL    Comment: Performed at [facility], [address] [place], [place] [address]  Phosphorus     Status: None   Collection Time: 06/08/23  4:27 AM   Result Value Ref Range   Phosphorus 2.5 2.5 - 4.6 mg/dL    Comment: Performed at [facility], [address], [place], [place] [address]  Protime-INR     Status: Abnormal   Collection Time: 06/08/23  4:27 AM  Result Value Ref Range   Prothrombin Time 16.1 (H) 11.4 -  15.2 seconds   INR 1.3 (H) 0.8 - 1.2    Comment: (NOTE) INR goal varies based on device and disease states. Performed at [facility], [address], [place], [place] [address]   CBG monitoring, ED     Status: Abnormal   Collection Time: 06/08/23  9:05 AM  Result Value Ref Range   Glucose-Capillary 173 (H) 70 - 99 mg/dL    Comment: Glucose reference range applies only to samples taken after fasting for at least 8 hours.   CT ABDOMEN PELVIS W CONTRAST  Result Date: 06/07/2023 CLINICAL DATA:  Peroneal abscess. Patient has 3 abscesses 1 near labia, 1 new pelvis, 1 year rectum. The abscess near the rectum is painful and has brown discharge that started on Monday. EXAM: CT ABDOMEN AND PELVIS WITH CONTRAST TECHNIQUE: Multidetector CT imaging of the abdomen and pelvis was performed using the standard protocol following bolus administration of intravenous contrast. RADIATION DOSE REDUCTION: This exam was performed according to the departmental dose-optimization program which includes automated exposure control, adjustment of the mA and/or kV according to patient size and/or use of iterative reconstruction technique. CONTRAST:  65mL OMNIPAQUE IOHEXOL 350 MG/ML SOLN COMPARISON:  CT abdomen and pelvis 09/04/2022 FINDINGS: Lower chest: No acute abnormality. Hepatobiliary: Unremarkable liver. Normal gallbladder. No biliary dilation. Pancreas: Unremarkable. Spleen: Unremarkable. Adrenals/Urinary Tract: Normal adrenal glands. No urinary calculi or hydronephrosis. Bladder is unremarkable. Stomach/Bowel: Normal caliber large and small bowel. Colonic diverticulosis without diverticulitis. The appendix is not visualized.Stomach is  within normal limits. Vascular/Lymphatic: Aortic atherosclerosis. No enlarged abdominal or pelvic lymph nodes. Reproductive: Status post hysterectomy. No adnexal masses. Other: No free intraperitoneal fluid or air. Moderate stranding and edema within the left labia. Small 1.4 cm focus of soft tissue thickening along the medial left labia with tiny focus of gas (series 12/image 126). The inflammatory stranding extends superiorly into the mons pubis. No drainable fluid collection. No evidence of deep tissue infection. Musculoskeletal: No acute fracture. Chronic fractures of the right inferior superior pubic rami. Sclerosis about the left-greater-than-right SI joints. IMPRESSION: Inflammatory stranding and edema within the left labia extending into the mons pubis. Small 1.4 cm focus of soft tissue thickening along the left labia with tiny focus of gas compatible with phlegmon. No drainable fluid collection. Aortic Atherosclerosis (ICD10-I70.0). Electronically Signed   By: Minerva Fester M.D.   On: 06/07/2023 20:14   DG Chest Port 1 View  Result Date: 06/07/2023 CLINICAL DATA:  Questionable sepsis EXAM: PORTABLE CHEST 1 VIEW COMPARISON:  Chest x-ray dated Jan 16, 2018 FINDINGS: The heart size and mediastinal contours are within normal limits. New mild opacity of the left costophrenic angle. Both lungs are otherwise clear. The visualized skeletal structures are unremarkable. IMPRESSION: New mild opacity of the left costophrenic angle, which may represent atelectasis, infection or aspiration can not be excluded. Recommend 6-8 week follow-up with PA and lateral chest radiograph to ensure resolution. Electronically Signed   By: Allegra Lai M.D.   On: 06/07/2023 17:17    Pending Labs Unresulted Labs (From admission, onward)     Start     Ordered   06/09/23 0500  Comprehensive metabolic panel  Tomorrow morning,   R        06/08/23 0945   06/09/23 0500  CBC  Tomorrow morning,   R        06/08/23 0945    06/09/23 0500  Magnesium  Tomorrow morning,   R        06/08/23 0945  06/08/23 0500  HIV Antibody (routine testing w rflx)  (HIV Antibody (Routine testing w reflex) panel)  Tomorrow morning,   R        06/07/23 2231   06/07/23 1517  Blood Culture (routine x 2)  (Undifferentiated presentation (screening labs and basic nursing orders))  BLOOD CULTURE X 2,   STAT      06/07/23 1516            Vitals/Pain Today's Vitals   06/08/23 0600 06/08/23 0700 06/08/23 0903 06/08/23 0917  BP: (!) 104/57 (!) 104/58 101/62   Pulse: (!) 104 (!) 102 94   Resp: (!) 30 17 20    Temp:   97.7 F (36.5 C)   TempSrc:   Oral   SpO2:  97% 96%   Weight:      Height:      PainSc:    5     Isolation Precautions No active isolations  Medications Medications  metroNIDAZOLE (FLAGYL) tablet 500 mg (500 mg Oral Given 06/08/23 0915)  cefTRIAXone (ROCEPHIN) 2 g in sodium chloride 0.9 % 100 mL IVPB (has no administration in time range)  sodium chloride flush (NS) 0.9 % injection 3 mL (3 mLs Intravenous Given 06/08/23 0916)  insulin glargine-yfgn (SEMGLEE) injection 25 Units (25 Units Subcutaneous Given 06/08/23 0916)  acetaminophen (TYLENOL) tablet 1,000 mg (1,000 mg Oral Given 06/08/23 0503)  polyethylene glycol (MIRALAX / GLYCOLAX) packet 17 g (has no administration in time range)  insulin aspart (novoLOG) injection 0-15 Units (3 Units Subcutaneous Given 06/08/23 0916)  insulin aspart (novoLOG) injection 0-5 Units (2 Units Subcutaneous Given 06/08/23 0124)  aspirin chewable tablet 81 mg (81 mg Oral Given 06/08/23 0915)  levothyroxine (SYNTHROID) tablet 50 mcg (50 mcg Oral Given 06/08/23 0502)  albuterol (PROVENTIL) (2.5 MG/3ML) 0.083% nebulizer solution 2.5 mg (has no administration in time range)  oxyCODONE (Oxy IR/ROXICODONE) immediate release tablet 2.5 mg (has no administration in time range)    Or  oxyCODONE (Oxy IR/ROXICODONE) immediate release tablet 5 mg (has no administration in time range)   vancomycin (VANCOREADY) IVPB 1250 mg/250 mL (has no administration in time range)  sodium chloride 0.9 % bolus 1,000 mL (0 mLs Intravenous Stopped 06/07/23 1729)  cefTRIAXone (ROCEPHIN) 2 g in sodium chloride 0.9 % 100 mL IVPB (0 g Intravenous Stopped 06/07/23 1729)  HYDROmorphone (DILAUDID) injection 0.5 mg (0.5 mg Intravenous Given 06/07/23 1746)  vancomycin (VANCOCIN) 2,500 mg in sodium chloride 0.9 % 500 mL IVPB (0 mg Intravenous Stopped 06/07/23 2123)  iohexol (OMNIPAQUE) 350 MG/ML injection 65 mL (65 mLs Intravenous Contrast Given 06/07/23 1818)  sodium chloride 0.9 % bolus 1,000 mL (0 mLs Intravenous Stopped 06/07/23 1938)    Mobility walks     Focused Assessments     R Recommendations: See Admitting Provider Note  Report given to:   Additional Notes: Followed by GYN

## 2023-06-08 NOTE — ED Notes (Signed)
Patient assisted up to the bathroom with 1+ assist, provided hospital bed for comfort

## 2023-06-08 NOTE — Consult Note (Signed)
Reason for Consult:vulvar infection Referring Physician: TRH  Recommendation: >continue vancomycin for likely MRSA involvement >stop rocephin + flagyl >start zosyn: spoke with pharmacy and her amoxicillin issue appears to be oral route related and zosyn gives better penetration in this clinical setting and liekly polymicrobial infection >add silvadene cream 1% TID for topical tissue penetration specifically for MRSA/staph involvement  Discussion: Terri Heath is an 67 y.o. female. 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 with spontaneous drainage this am.      The superficial tissue necrosis is separate from the vulva, although it looks contiguous in this photo.  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  No evidence of necrotizing fasciitis at this point and patient states she feels better clinically already.  No IR drainage based on CT scan would be helpful at this point and surgical approach would also be unhelpful.  She has spontaneously drained so that should be clinically helpful  Likely will require extended course of antibiotics, vancomycin and zosyn and outpatient orals for some time  At times these phlegmon processes coalesce after a few days of antibiotics  and experience liquefaction which may respond to drainage subsequently and would much prefer IR vs surgical especially in a diabetic patient  I have discussed this with the patient and her daughter at length and they understand the situation and the treatment recommendations.  Will also add topical silvadene 3 times per day which will be beneficial from both a MRSA/staph standpoint providing additional tissue penetration also in a patient with microvascular disease     Past Medical History:  Diagnosis Date   Abnormal vaginal bleeding in postmenopausal patient 10/11/2021   Acute systolic heart failure (HCC)    Allergy    Asthma    Bilateral shoulder pain    Borderline hypertension    Bronchitis    CAD (coronary artery disease)    Chronic systolic CHF (congestive heart failure) (HCC)    Chronic systolic congestive heart failure, NYHA class 3 (HCC) 07/24/2018   Closed fracture of distal fibula 07/17/2022   Dehydration 12/20/2021   Diabetes mellitus    Diagnosed in 2000, on insulin, Trulicity and metformin, not checking cgs at home   Drug-induced hypotension 12/20/2021   Elevated serum creatinine 12/20/2021   Gangrenous appendicitis with perforation s/p lap appendectomy 10/04/2017 10/04/2017   GERD (gastroesophageal reflux disease)    History of MI (myocardial infarction)    History of radiation therapy    Uterus- 12/14/21-02/01/22- Dr. Antony Blackbird   Hyperlipidemia    Hypertension    Hypothyroidism    MGUS (monoclonal gammopathy of unknown significance)    Mitral regurgitation    Myocardial infarction (HCC)    Neuropathy    Obesity    Rash of back 01/06/2021   Sleep apnea  uses CPAP   Statin intolerance    Uterine cancer [facility])     Past Surgical History:  Procedure Laterality Date   BREATH TEK H PYLORI  09/18/2011   Procedure: BREATH TEK H PYLORI;  Surgeon: Kandis Cocking, MD;  Location: [facility];  Service: General;  Laterality: N/A;   CESAREAN SECTION     x 3   IR  IMAGING GUIDED PORT INSERTION  12/07/2021   IR REMOVAL TUN ACCESS W/ PORT W/O FL MOD SED  04/04/2022   LAPAROSCOPIC APPENDECTOMY N/A 10/03/2017   Procedure: APPENDECTOMY LAPAROSCOPIC;  Surgeon: Romie Levee, MD;  Location: [facility];  Service: General;  Laterality: N/A;   OPERATIVE ULTRASOUND N/A 02/01/2022   Procedure: OPERATIVE ULTRASOUND;  Surgeon: Antony Blackbird, MD;  Location: [facility];  Service: Urology;  Laterality: N/A;   RIGHT/LEFT HEART CATH AND CORONARY ANGIOGRAPHY N/A 02/12/2018   Procedure: RIGHT/LEFT HEART CATH AND CORONARY ANGIOGRAPHY;  Surgeon: Corky Crafts, MD;  Location: [facility] CV LAB;  Service: Cardiovascular;  Laterality: N/A;   ROBOTIC ASSISTED TOTAL HYSTERECTOMY WITH BILATERAL SALPINGO OOPHERECTOMY Bilateral 03/21/2022   Procedure: XI ROBOTIC ASSISTED TOTAL HYSTERECTOMY WITH BILATERAL SALPINGO OOPHORECTOMY, LYSIS OF ADHESIONS, CYSTOSCOPY;  Surgeon: Carver Fila, MD;  Location: [facility];  Service: Gynecology;  Laterality: Bilateral;   TANDEM RING INSERTION N/A 02/01/2022   Procedure: TANDEM RING INSERTION;  Surgeon: Antony Blackbird, MD;  Location: [facility];  Service: Urology;  Laterality: N/A;   TUBAL LIGATION      Family History  Problem Relation Age of Onset   Alcohol abuse Mother    Arthritis Mother    Diabetes Mother    Sleep apnea Mother    Anxiety disorder Mother    Eating disorder Mother    Obesity Mother    Cancer Father    Alcohol abuse Father    Heart disease Father    Hypertension Father    Stroke Father    Sleep apnea Sister    Breast cancer Neg Hx    Colon cancer Neg Hx    Ovarian cancer Neg Hx    Endometrial cancer Neg Hx    Pancreatic cancer Neg Hx    Prostate cancer Neg Hx     Social History:  reports that she quit smoking about 33 years ago. Her smoking use included cigarettes. She started smoking about 48 years ago. She has a 15 pack-year smoking history. She has never used smokeless tobacco. She reports that she does not currently use  alcohol. She reports that she does not use drugs.  Allergies:  Allergies  Allergen Reactions   Amoxicillin Other (See Comments)    Dizziness, abdominal pain   Adhesive [Tape] Rash    Tegaderm   Exenatide Other (See Comments)    Flu-like symptoms   Invokana [Canagliflozin] Other (See Comments)    Yeast infection/dehydration   Jardiance [Empagliflozin] Other (See Comments)    Yeast infections and dehydration   Lisinopril Other (See Comments)    cramping   Other Diarrhea and Other (See Comments)    Kale=food  Per patient, it causes GI bleeding    Statins Other (See Comments)    Leg cramps      Results for orders placed or performed during the hospital encounter of 06/07/23 (from the past 48 hour(s))  Comprehensive metabolic panel     Status: Abnormal   Collection Time: 06/07/23  3:44 PM  Result Value Ref Range   Sodium 134 (L) 135 - 145 mmol/L  Potassium 4.5 3.5 - 5.1 mmol/L   Chloride 101 98 - 111 mmol/L   CO2 20 (L) 22 - 32 mmol/L   Glucose, Bld 224 (H) 70 - 99 mg/dL    Comment: Glucose reference range applies only to samples taken after fasting for at least 8 hours.   BUN 30 (H) 8 - 23 mg/dL   Creatinine, Ser 1.61 (H) 0.44 - 1.00 mg/dL   Calcium 8.6 (L) 8.9 - 10.3 mg/dL   Total Protein 7.3 6.5 - 8.1 g/dL   Albumin 2.5 (L) 3.5 - 5.0 g/dL   AST 13 (L) 15 - 41 U/L   ALT 10 0 - 44 U/L   Alkaline Phosphatase 105 38 - 126 U/L   Total Bilirubin 0.6 0.3 - 1.2 mg/dL   GFR, Estimated 38 (L) >60 mL/min    Comment: (NOTE) Calculated using the CKD-EPI Creatinine Equation (2021)    Anion gap 13 5 - 15    Comment: Performed at [facility], [address], [place], [place] [address]  CBC with Differential     Status: Abnormal   Collection Time: 06/07/23  3:44 PM  Result Value Ref Range   WBC 17.7 (H) 4.0 - 10.5 K/uL   RBC 3.78 (L) 3.87 - 5.11 MIL/uL   Hemoglobin 11.0 (L) 12.0 - 15.0 g/dL   HCT 54.0 (L) 98.1 - 19.1 %   MCV 91.3 80.0 - 100.0 fL   MCH 29.1 26.0 - 34.0  pg   MCHC 31.9 30.0 - 36.0 g/dL   RDW 47.8 29.5 - 62.1 %   Platelets 123 (L) 150 - 400 K/uL   nRBC 0.0 0.0 - 0.2 %   Neutrophils Relative % 88 %   Neutro Abs 15.5 (H) 1.7 - 7.7 K/uL   Lymphocytes Relative 4 %   Lymphs Abs 0.8 0.7 - 4.0 K/uL   Monocytes Relative 7 %   Monocytes Absolute 1.2 (H) 0.1 - 1.0 K/uL   Eosinophils Relative 0 %   Eosinophils Absolute 0.0 0.0 - 0.5 K/uL   Basophils Relative 0 %   Basophils Absolute 0.1 0.0 - 0.1 K/uL   Immature Granulocytes 1 %   Abs Immature Granulocytes 0.16 (H) 0.00 - 0.07 K/uL    Comment: Performed at [facility], [address] [place], [place] [address]  Protime-INR     Status: Abnormal   Collection Time: 06/07/23  3:44 PM  Result Value Ref Range   Prothrombin Time 15.8 (H) 11.4 - 15.2 seconds   INR 1.2 0.8 - 1.2    Comment: (NOTE) INR goal varies based on device and disease states. Performed at [facility], [address], [place], [place] [address]   APTT     Status: None   Collection Time: 06/07/23  3:44 PM  Result Value Ref Range   aPTT 29 24 - 36 seconds    Comment: Performed at [facility], [address], [place], [place] [address]  I-Stat Lactic Acid, ED     Status: Abnormal   Collection Time: 06/07/23  4:22 PM  Result Value Ref Range   Lactic Acid, Venous 2.2 (HH) 0.5 - 1.9 mmol/L   Comment NOTIFIED PHYSICIAN   I-Stat Lactic Acid, ED     Status: None   Collection Time: 06/07/23  5:54 PM  Result Value Ref Range   Lactic Acid, Venous 1.2 0.5 - 1.9 mmol/L  Urinalysis, w/ Reflex to Culture (Infection Suspected) -Urine, Clean Catch  Status: Abnormal   Collection Time: 06/07/23 10:49 PM  Result Value Ref Range   Specimen Source URINE, CLEAN CATCH    Color, Urine YELLOW YELLOW   APPearance CLOUDY (A) CLEAR   Specific Gravity, Urine >1.046 (H) 1.005 - 1.030   pH 5.0 5.0 - 8.0   Glucose, UA NEGATIVE NEGATIVE mg/dL   Hgb urine dipstick MODERATE (A) NEGATIVE   Bilirubin Urine NEGATIVE NEGATIVE    Ketones, ur 5 (A) NEGATIVE mg/dL   Protein, ur 30 (A) NEGATIVE mg/dL   Nitrite NEGATIVE NEGATIVE   Leukocytes,Ua LARGE (A) NEGATIVE   RBC / HPF 21-50 0 - 5 RBC/hpf   WBC, UA >50 0 - 5 WBC/hpf    Comment:        Reflex urine culture not performed if WBC <=10, OR if Squamous epithelial cells >5. If Squamous epithelial cells >5 suggest recollection.    Bacteria, UA RARE (A) NONE SEEN   Squamous Epithelial / HPF 11-20 0 - 5 /HPF   Mucus PRESENT     Comment: Performed at [facility], [address], [place], [place] [address]  Body fluid culture w Gram Stain     Status: None (Preliminary result)   Collection Time: 06/08/23  1:09 AM   Specimen: Abscess; Body Fluid  Result Value Ref Range   Specimen Description ABSCESS    Special Requests NONE    Gram Stain      FEW WBC PRESENT, PREDOMINANTLY MONONUCLEAR MODERATE GRAM NEGATIVE RODS FEW GRAM POSITIVE COCCI IN CLUSTERS Performed at [facility], [address] [place], [place] [address]    Culture PENDING    Report Status PENDING   CBG monitoring, ED     Status: Abnormal   Collection Time: 06/08/23  1:19 AM  Result Value Ref Range   Glucose-Capillary 202 (H) 70 - 99 mg/dL    Comment: Glucose reference range applies only to samples taken after fasting for at least 8 hours.  Basic metabolic panel     Status: Abnormal   Collection Time: 06/08/23  4:27 AM  Result Value Ref Range   Sodium 136 135 - 145 mmol/L   Potassium 4.3 3.5 - 5.1 mmol/L   Chloride 105 98 - 111 mmol/L   CO2 18 (L) 22 - 32 mmol/L   Glucose, Bld 215 (H) 70 - 99 mg/dL    Comment: Glucose reference range applies only to samples taken after fasting for at least 8 hours.   BUN 28 (H) 8 - 23 mg/dL   Creatinine, Ser 0.98 (H) 0.44 - 1.00 mg/dL   Calcium 8.3 (L) 8.9 - 10.3 mg/dL   GFR, Estimated 48 (L) >60 mL/min    Comment: (NOTE) Calculated using the CKD-EPI Creatinine Equation (2021)    Anion gap 13 5 - 15    Comment: Performed at [facility]  Lab, [address] [place], [place] [address]  CBC     Status: Abnormal   Collection Time: 06/08/23  4:27 AM  Result Value Ref Range   WBC 18.1 (H) 4.0 - 10.5 K/uL   RBC 3.37 (L) 3.87 - 5.11 MIL/uL   Hemoglobin 9.4 (L) 12.0 - 15.0 g/dL   HCT 78.2 (L) 95.6 - 21.3 %   MCV 88.1 80.0 - 100.0 fL   MCH 27.9 26.0 - 34.0 pg   MCHC 31.6 30.0 - 36.0 g/dL   RDW 08.6 57.8 - 46.9 %   Platelets 175 150 - 400 K/uL   nRBC 0.0  0.0 - 0.2 %    Comment: Performed at [facility], [address] [place], [place] [address]  Magnesium     Status: None   Collection Time: 06/08/23  4:27 AM  Result Value Ref Range   Magnesium 1.8 1.7 - 2.4 mg/dL    Comment: Performed at [facility], [address], [place], [place] [address]  Phosphorus     Status: None   Collection Time: 06/08/23  4:27 AM  Result Value Ref Range   Phosphorus 2.5 2.5 - 4.6 mg/dL    Comment: Performed at [facility], [address] [place], [place] [address]  Protime-INR     Status: Abnormal   Collection Time: 06/08/23  4:27 AM  Result Value Ref Range   Prothrombin Time 16.1 (H) 11.4 - 15.2 seconds   INR 1.3 (H) 0.8 - 1.2    Comment: (NOTE) INR goal varies based on device and disease states. Performed at [facility], [address], [place], [place] [address]   CBG monitoring, ED     Status: Abnormal   Collection Time: 06/08/23  9:05 AM  Result Value Ref Range   Glucose-Capillary 173 (H) 70 - 99 mg/dL    Comment: Glucose reference range applies only to samples taken after fasting for at least 8 hours.    CT ABDOMEN PELVIS W CONTRAST  Result Date: 06/07/2023 CLINICAL DATA:  Peroneal abscess. Patient has 3 abscesses 1 near labia, 1 new pelvis, 1 year rectum. The abscess near the rectum is painful and has brown discharge that started on Monday. EXAM: CT ABDOMEN AND PELVIS WITH CONTRAST TECHNIQUE: Multidetector CT imaging of the abdomen and pelvis was performed using the standard protocol following bolus  administration of intravenous contrast. RADIATION DOSE REDUCTION: This exam was performed according to the departmental dose-optimization program which includes automated exposure control, adjustment of the mA and/or kV according to patient size and/or use of iterative reconstruction technique. CONTRAST:  65mL OMNIPAQUE IOHEXOL 350 MG/ML SOLN COMPARISON:  CT abdomen and pelvis 09/04/2022 FINDINGS: Lower chest: No acute abnormality. Hepatobiliary: Unremarkable liver. Normal gallbladder. No biliary dilation. Pancreas: Unremarkable. Spleen: Unremarkable. Adrenals/Urinary Tract: Normal adrenal glands. No urinary calculi or hydronephrosis. Bladder is unremarkable. Stomach/Bowel: Normal caliber large and small bowel. Colonic diverticulosis without diverticulitis. The appendix is not visualized.Stomach is within normal limits. Vascular/Lymphatic: Aortic atherosclerosis. No enlarged abdominal or pelvic lymph nodes. Reproductive: Status post hysterectomy. No adnexal masses. Other: No free intraperitoneal fluid or air. Moderate stranding and edema within the left labia. Small 1.4 cm focus of soft tissue thickening along the medial left labia with tiny focus of gas (series 12/image 126). The inflammatory stranding extends superiorly into the mons pubis. No drainable fluid collection. No evidence of deep tissue infection. Musculoskeletal: No acute fracture. Chronic fractures of the right inferior superior pubic rami. Sclerosis about the left-greater-than-right SI joints. IMPRESSION: Inflammatory stranding and edema within the left labia extending into the mons pubis. Small 1.4 cm focus of soft tissue thickening along the left labia with tiny focus of gas compatible with phlegmon. No drainable fluid collection. Aortic Atherosclerosis (ICD10-I70.0). Electronically Signed   By: Minerva Fester M.D.   On: 06/07/2023 20:14   DG Chest Port 1 View  Result Date: 06/07/2023 CLINICAL DATA:  Questionable sepsis EXAM: PORTABLE CHEST 1  VIEW COMPARISON:  Chest x-ray dated Jan 16, 2018 FINDINGS: The heart size and mediastinal contours are within normal limits. New mild opacity of the left costophrenic angle.  Both lungs are otherwise clear. The visualized skeletal structures are unremarkable. IMPRESSION: New mild opacity of the left costophrenic angle, which may represent atelectasis, infection or aspiration can not be excluded. Recommend 6-8 week follow-up with PA and lateral chest radiograph to ensure resolution. Electronically Signed   By: Allegra Lai M.D.   On: 06/07/2023 17:17     We will see this patient and assess the process daily  Lazaro Arms Please call [phone] [facility] Consult Attending Monday-Friday 8am - 5pm) or [phone] [facility] OB/[facility]N Attending On Call all day, every day) for any  further gynecologic concerns at any time.  Thank you for involving [place] in the care of this patient.  06/08/2023 11:15 AM

## 2023-06-08 NOTE — H&P (Addendum)
History and Physical    Terri Heath:[id number] DOB: 04/30/1956 DOA: 06/07/2023  PCP: Tollie Eth, NP   Patient coming from: Clinic - referred by urgent care to ED    Chief Complaint:  Chief Complaint  Patient presents with   Abscess    HPI:  Terri Heath is a 67 y.o. female with hx of recurrent labial abscess, diabetes type 2, morbid obesity, endometrial cancer with history of chemoradiation, heart failure with improved ejection fraction, coronary artery disease, peripheral arterial disease, MGUS, hypertension, hyperlipidemia, who presents with 2 to 3-day history of pain in drainage from suspected abscess.  Reports a history of similar labial abscesses in the past.  However on this occasion had brown drainage, extreme malodor, and pain which led her to present to the emergency department.  She actually describes 2 separate areas of concern that she thought were abscesses: one involving the labia majora more superiorly and then another area more posteriorly again on the left labia.  States that the more posterior one is the one with worse drainage and more bothersome.  She has had fever and chills at home.  Has also been lightheaded   Review of Systems:  ROS complete and negative except as marked above   Allergies  Allergen Reactions   Amoxicillin Other (See Comments)    Dizziness, abdominal pain   Adhesive [Tape] Rash    Tegaderm   Exenatide Other (See Comments)    Flu-like symptoms   Invokana [Canagliflozin] Other (See Comments)    Yeast infection/dehydration   Jardiance [Empagliflozin] Other (See Comments)    Yeast infections and dehydration   Lisinopril Other (See Comments)    cramping   Other Diarrhea and Other (See Comments)    Kale=food  Per patient, it causes GI bleeding    Statins Other (See Comments)    Leg cramps    Prior to Admission medications   Medication Sig Start Date End Date Taking? Authorizing Provider  acetaminophen (TYLENOL) 500 MG  tablet Take 500-1,000 mg by mouth every 6 (six) hours as needed for mild pain or moderate pain.   Yes [provider]  albuterol (PROAIR HFA) 108 (90 Base) MCG/ACT inhaler Inhale 2 puffs into the lungs every 6 (six) hours as needed. wheezing 02/21/19  Yes Oretha Milch, MD  aspirin 81 MG chewable tablet Chew 1 tablet (81 mg total) by mouth daily. 10/27/22  Yes Laurey Morale, MD  carvedilol (COREG) 6.25 MG tablet Take 1 tablet (6.25 mg total) by mouth 2 (two) times daily. 05/03/23  Yes Early, Sung Amabile, NP  furosemide (LASIX) 20 MG tablet Take 1 tablet (20 mg total) by mouth daily. 05/03/23  Yes Early, Sung Amabile, NP  insulin glargine (LANTUS) 100 UNIT/ML Solostar Pen Inject 25-40 Units into the skin daily. 09/12/22  Yes Early, Sung Amabile, NP  levothyroxine (SYNTHROID) 50 MCG tablet Take 1 tablet (50 mcg total) by mouth daily, before a meal. 05/03/23  Yes Early, Sung Amabile, NP  magnesium oxide (MAG-OX) 400 (240 Mg) MG tablet Take 1 tablet (400 mg total) by mouth daily. 01/17/22  Yes Artis Delay, MD  metFORMIN (GLUCOPHAGE) 1000 MG tablet Take 1 tablet (1,000 mg total) by mouth 2 (two) times daily with a meal. 05/03/23  Yes Early, Sung Amabile, NP  Multiple Vitamin (MULITIVITAMIN WITH MINERALS) TABS Take 1 tablet by mouth daily with breakfast.   Yes [provider]  naproxen sodium (ALEVE) 220 MG tablet Take 440 mg by mouth daily as  needed (pain).   Yes [provider]  sacubitril-valsartan (ENTRESTO) 24-26 MG Take 1 tablet by mouth 2 (two) times daily. 05/03/23  Yes Early, Sung Amabile, NP  spironolactone (ALDACTONE) 25 MG tablet Take 1 tablet (25 mg total) by mouth daily. 05/03/23  Yes Early, Sung Amabile, NP  tirzepatide [facility]) 2.5 MG/0.5ML Pen Inject 2.5 mg into the skin once a week. 05/03/23  Yes Early, Sung Amabile, NP  blood glucose meter kit and supplies Use up to four times daily as directed. (FOR ICD-10 E10.9, E11.9). 02/10/22   Tollie Eth, NP  Evolocumab (REPATHA SURECLICK) 140 MG/ML SOAJ Inject 140 mg into the  skin every 14 (fourteen) days. 01/24/23   Laurey Morale, MD  glucose blood test strip Use as directed to test blood sugar 02/10/22   Early, Sung Amabile, NP  OneTouch Delica Lancets 33G MISC use as directed 01/21/21   Early, Sung Amabile, NP  triamcinolone ointment (KENALOG) 0.1 % Apply 1 application topically two times daily to affected area(s) as needed, sparing use to avoid whitening/thinning skin 01/06/21   Early, Sung Amabile, NP  insulin detemir (LEVEMIR) 100 UNIT/ML FlexPen Inject 10 Units into the skin daily. 09/20/20 10/26/20  Just, Azalee Course, FNP    Past Medical History:  Diagnosis Date   Abnormal vaginal bleeding in postmenopausal patient 10/11/2021   Acute systolic heart failure (HCC)    Allergy    Asthma    Bilateral shoulder pain    Borderline hypertension    Bronchitis    CAD (coronary artery disease)    Chronic systolic CHF (congestive heart failure) (HCC)    Chronic systolic congestive heart failure, NYHA class 3 (HCC) 07/24/2018   Closed fracture of distal fibula 07/17/2022   Dehydration 12/20/2021   Diabetes mellitus    Diagnosed in 2000, on insulin, Trulicity and metformin, not checking cgs at home   Drug-induced hypotension 12/20/2021   Elevated serum creatinine 12/20/2021   Gangrenous appendicitis with perforation s/p lap appendectomy 10/04/2017 10/04/2017   GERD (gastroesophageal reflux disease)    History of MI (myocardial infarction)    History of radiation therapy    Uterus- 12/14/21-02/01/22- Dr. Antony Blackbird   Hyperlipidemia    Hypertension    Hypothyroidism    MGUS (monoclonal gammopathy of unknown significance)    Mitral regurgitation    Myocardial infarction (HCC)    Neuropathy    Obesity    Rash of back 01/06/2021   Sleep apnea    uses CPAP   Statin intolerance    Uterine cancer [facility])     Past Surgical History:  Procedure Laterality Date   BREATH TEK H PYLORI  09/18/2011   Procedure: BREATH TEK H PYLORI;  Surgeon: Kandis Cocking, MD;  Location: [facility];   Service: General;  Laterality: N/A;   CESAREAN SECTION     x 3   IR IMAGING GUIDED PORT INSERTION  12/07/2021   IR REMOVAL TUN ACCESS W/ PORT W/O FL MOD SED  04/04/2022   LAPAROSCOPIC APPENDECTOMY N/A 10/03/2017   Procedure: APPENDECTOMY LAPAROSCOPIC;  Surgeon: Romie Levee, MD;  Location: [facility];  Service: General;  Laterality: N/A;   OPERATIVE ULTRASOUND N/A 02/01/2022   Procedure: OPERATIVE ULTRASOUND;  Surgeon: Antony Blackbird, MD;  Location: [facility];  Service: Urology;  Laterality: N/A;   RIGHT/LEFT HEART CATH AND CORONARY ANGIOGRAPHY N/A 02/12/2018   Procedure: RIGHT/LEFT HEART CATH AND CORONARY ANGIOGRAPHY;  Surgeon: Corky Crafts, MD;  Location: [facility] INVASIVE CV LAB;  Service: Cardiovascular;  Laterality: N/A;   ROBOTIC ASSISTED TOTAL HYSTERECTOMY WITH BILATERAL SALPINGO OOPHERECTOMY Bilateral 03/21/2022   Procedure: XI ROBOTIC ASSISTED TOTAL HYSTERECTOMY WITH BILATERAL SALPINGO OOPHORECTOMY, LYSIS OF ADHESIONS, CYSTOSCOPY;  Surgeon: Carver Fila, MD;  Location: [facility];  Service: Gynecology;  Laterality: Bilateral;   TANDEM RING INSERTION N/A 02/01/2022   Procedure: TANDEM RING INSERTION;  Surgeon: Antony Blackbird, MD;  Location: [facility];  Service: Urology;  Laterality: N/A;   TUBAL LIGATION       reports that she quit smoking about 33 years ago. Her smoking use included cigarettes. She started smoking about 48 years ago. She has a 15 pack-year smoking history. She has never used smokeless tobacco. She reports that she does not currently use alcohol. She reports that she does not use drugs.  Family History  Problem Relation Age of Onset   Alcohol abuse Mother    Arthritis Mother    Diabetes Mother    Sleep apnea Mother    Anxiety disorder Mother    Eating disorder Mother    Obesity Mother    Cancer Father    Alcohol abuse Father    Heart disease Father    Hypertension Father    Stroke Father    Sleep apnea Sister    Breast cancer Neg Hx    Colon cancer Neg Hx    Ovarian  cancer Neg Hx    Endometrial cancer Neg Hx    Pancreatic cancer Neg Hx    Prostate cancer Neg Hx      Physical Exam: Vitals:   06/07/23 2045 06/07/23 2115 06/08/23 0030 06/08/23 0041  BP: (!) 106/59 105/81 (!) 88/40   Pulse:   100   Resp: (!) 21 (!) 24 (!) 33   Temp:    98.1 F (36.7 C)  TempSrc:    Oral  SpO2: 100%  95%   Weight:      Height:        Gen: Awake, alert, NAD,   CV: Regular, normal S1, S2, no murmurs  Resp: Normal WOB, rales in both bases Abd: Obese, normoactive, nontender GU: Performed with chaperone Burman Nieves, RN.  There is swelling, induration and tenderness involving the left labia majora without opening at this more superior location.  More inferiorly towards the perineum there is a open approximate 0.5 cm area which is draining gross purulent and green-colored fluid.  There is no crepitus, dusky skin changes, or evidence of Fournier's MSK: Symmetric, no edema  Skin: See GU for findings.  No other rashes lesions Neuro: Alert and interactive  Psych: euthymic, appropriate    Data review:   Labs reviewed, notable for:   Hyperglycemic in the 220s Creatinine 1.5, baseline 0.9 WBC 17  Last A1c 9.3% in 9/'24  Micro:  Results for orders placed or performed during the hospital encounter of 03/08/22  Surgical pcr screen     Status: None   Collection Time: 03/08/22  9:00 AM   Specimen: Nasal Mucosa; Nasal Swab  Result Value Ref Range Status   MRSA, PCR NEGATIVE NEGATIVE Final   Staphylococcus aureus NEGATIVE NEGATIVE Final    Comment: (NOTE) The Xpert SA Assay (FDA approved for NASAL specimens in patients 64 years of age and older), is one component of a comprehensive surveillance program. It is not intended to diagnose infection nor to guide or monitor treatment. Performed at [facility], [address], [place], [place] [address]     Imaging reviewed:  CT ABDOMEN PELVIS W CONTRAST  Result Date: 06/07/2023 CLINICAL  DATA:  Peroneal abscess. Patient has 3 abscesses 1 near labia, 1 new pelvis, 1 year rectum. The abscess near the rectum is painful and has brown discharge that started on Monday. EXAM: CT ABDOMEN AND PELVIS WITH CONTRAST TECHNIQUE: Multidetector CT imaging of the abdomen and pelvis was performed using the standard protocol following bolus administration of intravenous contrast. RADIATION DOSE REDUCTION: This exam was performed according to the departmental dose-optimization program which includes automated exposure control, adjustment of the mA and/or kV according to patient size and/or use of iterative reconstruction technique. CONTRAST:  65mL OMNIPAQUE IOHEXOL 350 MG/ML SOLN COMPARISON:  CT abdomen and pelvis 09/04/2022 FINDINGS: Lower chest: No acute abnormality. Hepatobiliary: Unremarkable liver. Normal gallbladder. No biliary dilation. Pancreas: Unremarkable. Spleen: Unremarkable. Adrenals/Urinary Tract: Normal adrenal glands. No urinary calculi or hydronephrosis. Bladder is unremarkable. Stomach/Bowel: Normal caliber large and small bowel. Colonic diverticulosis without diverticulitis. The appendix is not visualized.Stomach is within normal limits. Vascular/Lymphatic: Aortic atherosclerosis. No enlarged abdominal or pelvic lymph nodes. Reproductive: Status post hysterectomy. No adnexal masses. Other: No free intraperitoneal fluid or air. Moderate stranding and edema within the left labia. Small 1.4 cm focus of soft tissue thickening along the medial left labia with tiny focus of gas (series 12/image 126). The inflammatory stranding extends superiorly into the mons pubis. No drainable fluid collection. No evidence of deep tissue infection. Musculoskeletal: No acute fracture. Chronic fractures of the right inferior superior pubic rami. Sclerosis about the left-greater-than-right SI joints. IMPRESSION: Inflammatory stranding and edema within the left labia extending into the mons pubis. Small 1.4 cm focus of soft  tissue thickening along the left labia with tiny focus of gas compatible with phlegmon. No drainable fluid collection. Aortic Atherosclerosis (ICD10-I70.0). Electronically Signed   By: Minerva Fester M.D.   On: 06/07/2023 20:14   DG Chest Port 1 View  Result Date: 06/07/2023 CLINICAL DATA:  Questionable sepsis EXAM: PORTABLE CHEST 1 VIEW COMPARISON:  Chest x-ray dated Jan 16, 2018 FINDINGS: The heart size and mediastinal contours are within normal limits. New mild opacity of the left costophrenic angle. Both lungs are otherwise clear. The visualized skeletal structures are unremarkable. IMPRESSION: New mild opacity of the left costophrenic angle, which may represent atelectasis, infection or aspiration can not be excluded. Recommend 6-8 week follow-up with PA and lateral chest radiograph to ensure resolution. Electronically Signed   By: Allegra Lai M.D.   On: 06/07/2023 17:17    ED Course:  Treated with vancomycin, ceftriaxone, 3 L IV fluid, Dilaudid.  ED provider contacted gynecology on-call who recommends gynecology consult in the morning with Dr. Hyacinth Meeker who follows her own patients.   Assessment/Plan:  67 y.o. female with hx recurrent labial abscess, diabetes type 2, morbid obesity, endometrial cancer with history of chemoradiation, heart failure with improved ejection fraction, coronary artery disease, peripheral arterial disease, MGUS, hypertension, hyperlipidemia, who presents with 2 to 3-day history of pain in drainage. Found to be septic with resolved shock and found to have openly draining Left labial / perineal abscess.   Left labial / perineal abscess - spontaneously draining  Sepsis secondary to above improving, with mixed septic / hypovolemic shock resolved Lactic acidosis, resolved Sepsis criteria tachycardia, leukocytosis.  With initial shock BP 70s over 50s, shock resolved with IV fluids.  Lactate 2.2 down to 1.2.  Exam with a area of open draining abscess inferiorly at  pereineum.  Also with likely Bartholin gland cyst.  CT demonstrating a 1.4 cm focus of soft tissue  thickening along the medial left labia with tiny focus of gas, drainable abscess per read.  -  ED provider contacted gynecology on-call who recommends gynecology consult in the morning with Dr. Hyacinth Meeker who follows her own patients. - Send purulent drainage for culture  - Continue Vancomycin, Ceftriaxone 2 g IV every 24 hours, Add Flagyl  - Status post 3 L IV fluid, lactate is normalized.  Continue oral hydration - N.p.o. after midnight in case she needs I&D or further exploration of abscess.;  But with CT findings and spontaneous drainage think less likely needed -Symptomatic management: Tylenol prn for mild, oxycodone 2.5/5 mg every 6 hour prn for moderate/severe  AKI stage I Baseline creatinine 0.9, elevated to 1.5 on admission Suspect prerenal in the setting of sepsis -IV fluids per above, now continue oral hydration  Diabetes mellitus type 2, with hyperglycemia Uncontrolled, last A1c 9.3% last month.  Has history of recurrent abscess. -Continue home basal, sub for Semglee 25 units in the morning -Add sliding scale, moderate intensity to start.  Left base opacity on chest x-ray Doubt pneumonia contributing to her sepsis.  -Recommend follow-up chest x-ray in 6 to 8 weeks per radiology  Chronic medical problems: Heart failure with improved ejection fraction: Hold her home GDMT and Lasix in setting of resolving shock CAD, PAD: Continue home aspirin.  She is on Repatha outpatient. Hypertension: See above, holding GDMT OSA: CPAP nightly History endometrial cancer status post chemoradiation: Outpatient follow-up    Body mass index is 45.73 kg/m.  Obesity class III contributing to her medical issues above  DVT prophylaxis:  SCDs Code Status:  Full Code Diet:  Diet Orders (From admission, onward)     Start     Ordered   06/07/23 2229  Diet Carb Modified Fluid consistency: Thin; Room  service appropriate? Yes  Diet effective now       Question Answer Comment  Diet-HS Snack? Nothing   Calorie Level Medium 1600-2000   Fluid consistency: Thin   Room service appropriate? Yes      06/07/23 2231           Family Communication: No Consults: Gynecology consult in morning Admission status:   Inpatient, Step Down Unit  Severity of Illness: The appropriate patient status for this patient is INPATIENT. Inpatient status is judged to be reasonable and necessary in order to provide the required intensity of service to ensure the patient's safety. The patient's presenting symptoms, physical exam findings, and initial radiographic and laboratory data in the context of their chronic comorbidities is felt to place them at high risk for further clinical deterioration. Furthermore, it is not anticipated that the patient will be medically stable for discharge from the hospital within 2 midnights of admission.   * I certify that at the point of admission it is my clinical judgment that the patient will require inpatient hospital care spanning beyond 2 midnights from the point of admission due to high intensity of service, high risk for further deterioration and high frequency of surveillance required.*   Dolly Rias, MD [facility]  How to contact the [facility] Attending or Consulting provider 7A - 7P or covering provider during after hours 7P -7A, for this patient.  Check the care team in [facility] and look for a) attending/consulting [facility] provider listed and b) the [facility] team listed Log into [email or web address] and use [employer]'s universal password to access. If you do not have the password, please contact the hospital operator. Locate the [facility] provider you are  looking for under [facility] and page to a number that you can be directly reached. If you still have difficulty reaching the provider, please page the [facility] (Director on Call) for the Hospitalists listed on amion for  assistance.  06/08/2023, 2:30 AM

## 2023-06-09 DIAGNOSIS — N764 Abscess of vulva: Secondary | ICD-10-CM | POA: Diagnosis not present

## 2023-06-09 LAB — COMPREHENSIVE METABOLIC PANEL
ALT: 15 U/L (ref 0–44)
AST: 16 U/L (ref 15–41)
Albumin: 1.9 g/dL — ABNORMAL LOW (ref 3.5–5.0)
Alkaline Phosphatase: 102 U/L (ref 38–126)
Anion gap: 13 (ref 5–15)
BUN: 32 mg/dL — ABNORMAL HIGH (ref 8–23)
CO2: 18 mmol/L — ABNORMAL LOW (ref 22–32)
Calcium: 8.3 mg/dL — ABNORMAL LOW (ref 8.9–10.3)
Chloride: 100 mmol/L (ref 98–111)
Creatinine, Ser: 1.52 mg/dL — ABNORMAL HIGH (ref 0.44–1.00)
GFR, Estimated: 38 mL/min — ABNORMAL LOW (ref >60)
Glucose, Bld: 198 mg/dL — ABNORMAL HIGH (ref 70–99)
Potassium: 4 mmol/L (ref 3.5–5.1)
Sodium: 131 mmol/L — ABNORMAL LOW (ref 135–145)
Total Bilirubin: 0.6 mg/dL (ref 0.3–1.2)
Total Protein: 6.4 g/dL — ABNORMAL LOW (ref 6.5–8.1)

## 2023-06-09 LAB — CBC
HCT: 31 % — ABNORMAL LOW (ref 36.0–46.0)
Hemoglobin: 10.1 g/dL — ABNORMAL LOW (ref 12.0–15.0)
MCH: 28.9 pg (ref 26.0–34.0)
MCHC: 32.6 g/dL (ref 30.0–36.0)
MCV: 88.6 fL (ref 80.0–100.0)
Platelets: 160 10*3/uL (ref 150–400)
RBC: 3.5 MIL/uL — ABNORMAL LOW (ref 3.87–5.11)
RDW: 14.4 % (ref 11.5–15.5)
WBC: 19.4 10*3/uL — ABNORMAL HIGH (ref 4.0–10.5)
nRBC: 0 % (ref 0.0–0.2)

## 2023-06-09 LAB — GLUCOSE, CAPILLARY
Glucose-Capillary: 173 mg/dL — ABNORMAL HIGH (ref 70–99)
Glucose-Capillary: 206 mg/dL — ABNORMAL HIGH (ref 70–99)
Glucose-Capillary: 199 mg/dL — ABNORMAL HIGH (ref 70–99)
Glucose-Capillary: 196 mg/dL — ABNORMAL HIGH (ref 70–99)

## 2023-06-09 LAB — MAGNESIUM: Magnesium: 2 mg/dL (ref 1.7–2.4)

## 2023-06-09 MED ORDER — MAGNESIUM HYDROXIDE 400 MG/5ML PO SUSP
30.0000 mL | Freq: Every day | ORAL | Status: DC | PRN
  Administered 2023-06-10: 30 mL via ORAL
  Filled 2023-06-09 (×2): qty 30

## 2023-06-09 MED ORDER — CARVEDILOL 3.125 MG PO TABS
3.1250 mg | ORAL_TABLET | Freq: Two times a day (BID) | ORAL | Status: DC
  Administered 2023-06-09 – 2023-06-13 (×7): 3.125 mg via ORAL
  Filled 2023-06-09 (×7): qty 1

## 2023-06-09 MED ORDER — SODIUM CHLORIDE 0.9 % IV BOLUS
500.0000 mL | Freq: Once | INTRAVENOUS | Status: AC
  Administered 2023-06-09: 500 mL via INTRAVENOUS

## 2023-06-09 NOTE — Progress Notes (Signed)
   06/09/23 2224  BiPAP/CPAP/SIPAP  $ Non-Invasive Home Ventilator  Subsequent  BiPAP/CPAP/SIPAP Pt Type Adult  BiPAP/CPAP/SIPAP Resmed  Mask Type Nasal mask  Mask Size Small  PEEP 12 cmH20  FiO2 (%) 21 %  Patient Home Equipment Yes  Auto Titrate No  Safety Check Completed by RT for Home Unit Yes, no issues noted

## 2023-06-09 NOTE — Progress Notes (Signed)
Patient ID: Terri Heath, female   DOB: February 26, 1956, 67 y.o.   MRN: [id number]  GYN CONSULT FOLLOW UP:    Chief Complaint  Patient presents with   Abscess    Blood pressure (!) 113/47, pulse 99, temperature 98.3 F (36.8 C), temperature source Oral, resp. rate 16, height 5\' 7"  (1.702 m), weight (!) 137.4 kg, SpO2 98%.  Assessment: HD#2 Left vulvar phlegmon s/p small amount spontaneous drainage inferiroly + diabetes x 25 years: Today's exam is basically stable, edges are unchanged so no evidence of expanding process, tissue is softer today which points to less tissue edema, no coalescing liquefaction appreciated today Culture prelim Gram + cocci in pairs but will assume polymicrobial and MRSA associated  Continue vanc and zosyn + silvadene If begins to soften more in the coming days may rescan to evaluate for IR drainage and more aggressive local care with soaks/baths  We will continue to evaluate daily   Lazaro Arms, MD 06/09/2023 10:16 AM   Please call [phone] [facility] Consult Attending Monday-Friday 8am - 5pm) or [phone] [facility] Attending On Call all day, every day) for any  further gynecologic concerns at any time.  Thank you for involving [place] in the care of this patient.       Orders for this encounter: Orders Placed This Encounter  Procedures   Critical Care   Blood Culture (routine x 2)   Body fluid culture w Gram Stain   MRSA Next Gen by PCR, Nasal   DG Chest Port 1 View   CT ABDOMEN PELVIS W CONTRAST   Comprehensive metabolic panel   CBC with Differential   Protime-INR   APTT   Urinalysis, w/ Reflex to Culture (Infection Suspected) -Urine, Clean Catch   HIV Antibody (routine testing w rflx)   Basic metabolic panel   CBC   Magnesium   Phosphorus   Protime-INR   Comprehensive metabolic panel   CBC   Magnesium   Glucose, capillary   Glucose, capillary   Glucose, capillary   Comprehensive metabolic panel   CBC   Magnesium   Diet  Carb Modified Fluid consistency: Thin; Room service appropriate? Yes   Document height and weight   Assess and Document Glasgow Coma Scale   Document vital signs within 1-hour of fluid bolus completion.  Notify provider of abnormal vital signs despite fluid resuscitation.   Refer to Sidebar Report: Sepsis Bundle ED/IP   Notify provider for difficulties obtaining IV access   Initiate Carrier Fluid Protocol   DO NOT delay antibiotics if unable to obtain blood culture.   Patient may eat/drink   Vital signs   Notify physician (specify)   Refer to Sidebar Report Refer to ICU, Med-Surg, Progressive, and Step-Down Mobility Protocol Sidebars   Do not place and if present remove PureWick   Initiate Oral Care Protocol   Initiate Carrier Fluid Protocol   RN may order General Admission PRN Orders utilizing "General Admission PRN medications" (through manage orders) for the following patient needs: allergy symptoms (Claritin), cold sores (Carmex), cough (Robitussin DM), eye irritation (Liquifilm Tears), hemorrhoids (Tucks), indigestion (Maalox), minor skin irritation (Hydrocortisone Cream), muscle pain Romeo Apple Gay), nose irritation (saline nasal spray) and sore throat (Chloraseptic spray).   SCDs   Patient has an active order for admit to inpatient/place in observation   Maintain IV access   Up with assistance   Apply Diabetes Mellitus Care Plan   STAT CBG when hypoglycemia is suspected. If treated, recheck every 15 minutes  after each treatment until CBG >/= 70 mg/dl   Refer to Hypoglycemia Protocol Sidebar Report for treatment of CBG < 70 mg/dl   Daily weights   Intake and Output   Swab Process:   Considerations:   If MRSA PCR Screen is Positive:   Cardiac Monitoring - Continuous Indefinite   Full code   Code Sepsis activation.  This occurs automatically when order is signed and prioritizes pharmacy, lab, and radiology services for STAT collections and interventions.  If [facility] downtime, call [facility]  [phone]) to activate Code Sepsis.   monitoring by pharmacy   Consult to general surgery   Consult to hospitalist   vancomycin per pharmacy consult   OT eval and treat   Oxygen therapy Mode or (Route): Nasal cannula; Liters Per Minute: 2; Keep 02 saturation: greater than 92 %   CPAP   I-Stat Lactic Acid, ED   CBG monitoring, ED   CBG monitoring, ED   CBG monitoring, ED   EKG   EKG   EKG   EKG   EKG   EKG   Insert peripheral IV X 1   Insert 2nd peripheral IV if not already present.   Admit to Inpatient (patient's expected length of stay will be greater than 2 midnights or inpatient only procedure)   Admit to Inpatient (patient's expected length of stay will be greater than 2 midnights or inpatient only procedure)    Impression + Management Plan   ICD-10-CM   1. Cellulitis of perineum  L03.315     2. Labial abscess  N76.4       Follow Up: No follow-ups on file.     All questions were answered.  Past Medical History:  Diagnosis Date   Abnormal vaginal bleeding in postmenopausal patient 10/11/2021   Acute systolic heart failure (HCC)    Allergy    Asthma    Bilateral shoulder pain    Borderline hypertension    Bronchitis    CAD (coronary artery disease)    Chronic systolic CHF (congestive heart failure) (HCC)    Chronic systolic congestive heart failure, NYHA class 3 (HCC) 07/24/2018   Closed fracture of distal fibula 07/17/2022   Dehydration 12/20/2021   Diabetes mellitus    Diagnosed in 2000, on insulin, Trulicity and metformin, not checking cgs at home   Drug-induced hypotension 12/20/2021   Elevated serum creatinine 12/20/2021   Gangrenous appendicitis with perforation s/p lap appendectomy 10/04/2017 10/04/2017   GERD (gastroesophageal reflux disease)    History of MI (myocardial infarction)    History of radiation therapy    Uterus- 12/14/21-02/01/22- Dr. Antony Blackbird   Hyperlipidemia    Hypertension    Hypothyroidism    MGUS (monoclonal gammopathy  of unknown significance)    Mitral regurgitation    Myocardial infarction (HCC)    Neuropathy    Obesity    Rash of back 01/06/2021   Sleep apnea    uses CPAP   Statin intolerance    Uterine cancer (HCC)     Past Surgical History:  Procedure Laterality Date   BREATH TEK H PYLORI  09/18/2011   Procedure: BREATH TEK H PYLORI;  Surgeon: Kandis Cocking, MD;  Location: [facility];  Service: General;  Laterality: N/A;   CESAREAN SECTION     x 3   IR IMAGING GUIDED PORT INSERTION  12/07/2021   IR REMOVAL TUN ACCESS W/ PORT W/O FL MOD SED  04/04/2022   LAPAROSCOPIC APPENDECTOMY N/A 10/03/2017   Procedure: APPENDECTOMY LAPAROSCOPIC;  Surgeon: Romie Levee, MD;  Location: [facility];  Service: General;  Laterality: N/A;   OPERATIVE ULTRASOUND N/A 02/01/2022   Procedure: OPERATIVE ULTRASOUND;  Surgeon: Antony Blackbird, MD;  Location: [facility];  Service: Urology;  Laterality: N/A;   RIGHT/LEFT HEART CATH AND CORONARY ANGIOGRAPHY N/A 02/12/2018   Procedure: RIGHT/LEFT HEART CATH AND CORONARY ANGIOGRAPHY;  Surgeon: Corky Crafts, MD;  Location: [facility] CV [facility];  Service: Cardiovascular;  Laterality: N/A;   ROBOTIC ASSISTED TOTAL HYSTERECTOMY WITH BILATERAL SALPINGO OOPHERECTOMY Bilateral 03/21/2022   Procedure: XI ROBOTIC ASSISTED TOTAL HYSTERECTOMY WITH BILATERAL SALPINGO OOPHORECTOMY, LYSIS OF ADHESIONS, CYSTOSCOPY;  Surgeon: Carver Fila, MD;  Location: [facility];  Service: Gynecology;  Laterality: Bilateral;   TANDEM RING INSERTION N/A 02/01/2022   Procedure: TANDEM RING INSERTION;  Surgeon: Antony Blackbird, MD;  Location: [facility];  Service: Urology;  Laterality: N/A;   TUBAL LIGATION      OB History     Gravida  3   Para  3   Term      Preterm      AB      Living         SAB      IAB      Ectopic      Multiple      Live Births              Allergies  Allergen Reactions   Amoxicillin Other (See Comments)    Dizziness, abdominal pain   Adhesive [Tape] Rash     Tegaderm   Exenatide Other (See Comments)    Flu-like symptoms   Invokana [Canagliflozin] Other (See Comments)    Yeast infection/dehydration   Jardiance [Empagliflozin] Other (See Comments)    Yeast infections and dehydration   Lisinopril Other (See Comments)    cramping   Other Diarrhea and Other (See Comments)    Kale=food  Per patient, it causes GI bleeding    Statins Other (See Comments)    Leg cramps    Social History   Socioeconomic History   Marital status: Married    Spouse name: Not on file   Number of children: Not on file   Years of education: Not on file   Highest education level: Not on file  Occupational History   Occupation: retired Sales promotion account executive, worked at [employer]  Tobacco Use   Smoking status: Former    Current packs/day: 0.00    Average packs/day: 1 pack/day for 15.0 years (15.0 ttl pk-yrs)    Types: Cigarettes    Start date: 01/29/1975    Quit date: 01/28/1990    Years since quitting: 33.3   Smokeless tobacco: Never  Vaping Use   Vaping status: Never Used  Substance and Sexual Activity   Alcohol use: Not Currently   Drug use: No   Sexual activity: Not Currently    Birth control/protection: Post-menopausal, Abstinence  Other Topics Concern   Not on file  Social History Narrative   Lives in [place]   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   Social Determinants of Health   Financial Resource Strain: Low Risk  (05/03/2023)   Overall Financial Resource Strain (CARDIA)    Difficulty of Paying Living Expenses: Not very hard  Food Insecurity: No Food Insecurity (06/07/2023)   Hunger Vital Sign    Worried About Running Out of Food in the Last Year: Never true    Ran Out of Food in the Last Year: Never true  Transportation Needs: No Transportation Needs (06/07/2023)   PRAPARE - Administrator, Civil Service (Medical): No    Lack of Transportation (Non-Medical): No  Physical Activity: Insufficiently Active (05/03/2023)   Exercise Vital  Sign    Days of Exercise per Week: 1 day    Minutes of Exercise per Session: 30 min  Stress: No Stress Concern Present (05/03/2023)   [employer] of Occupational Health - Occupational Stress Questionnaire    Feeling of Stress : Only a little  Social Connections: Unknown (05/03/2023)   Social Connection and Isolation Panel [NHANES]    Frequency of Communication with Friends and Family: Three times a week    Frequency of Social Gatherings with Friends and Family: Once a week    Attends Religious Services: Patient unable to answer    Active Member of Clubs or Organizations: Yes    Attends Banker Meetings: Never    Marital Status: Married    Family History  Problem Relation Age of Onset   Alcohol abuse Mother    Arthritis Mother    Diabetes Mother    Sleep apnea Mother    Anxiety disorder Mother    Eating disorder Mother    Obesity Mother    Cancer Father    Alcohol abuse Father    Heart disease Father    Hypertension Father    Stroke Father    Sleep apnea Sister    Breast cancer Neg Hx    Colon cancer Neg Hx    Ovarian cancer Neg Hx    Endometrial cancer Neg Hx    Pancreatic cancer Neg Hx    Prostate cancer Neg Hx

## 2023-06-09 NOTE — Evaluation (Signed)
Occupational Therapy Evaluation Patient Details Name: Terri Heath MRN: [id number] DOB: 05/19/1956 Today's Date: 06/09/2023   History of Present Illness Pt presenting 10/10 with 3 abscesses in perineal area. Found to be septic. PMHx of recurrent labial abscess, DMII, morbid obesity, endometrial cancer with history of chemoradiation, heart failure with improved ejection fraction, CAD, PAD, MGUS, HTN, HLD   Clinical Impression   PTA, pt lived with her husband and was independent. Upon eval, pt with decreased activity tolerance, perineal pain, back pain, decr BLE sensation secondary to neuropathy, and decr balance affecting safety with mobility and participation in ADL. Pt requiring CGA for all OOB ADL and with significantly decr activity tolerance needing rest break immediately after standing for oral care. Pt family able to provide limited amount of support. Patient will benefit from continued inpatient follow up therapy, <3 hours/day        If plan is discharge home, recommend the following: A little help with walking and/or transfers;A little help with bathing/dressing/bathroom;Assistance with cooking/housework;Assist for transportation;Help with stairs or ramp for entrance    Functional Status Assessment  Patient has had a recent decline in their functional status and demonstrates the ability to make significant improvements in function in a reasonable and predictable amount of time.  Equipment Recommendations  Other (comment) (defer)    Recommendations for Other Services PT consult     Precautions / Restrictions        Mobility Bed Mobility Overal bed mobility: Modified Independent             General bed mobility comments: instructed in use of log roll technqiue for optimizing comfort    Transfers Overall transfer level: Needs assistance   Transfers: Sit to/from Stand Sit to Stand: Contact guard assist           General transfer comment: CGA for safety       Balance Overall balance assessment: Needs assistance Sitting-balance support: No upper extremity supported, Feet supported Sitting balance-Leahy Scale: Fair Sitting balance - Comments: poor tolerance of sitting secondary to pain   Standing balance support: No upper extremity supported, Single extremity supported, During functional activity Standing balance-Leahy Scale: Poor Standing balance comment: reliant on device                           ADL either performed or assessed with clinical judgement   ADL Overall ADL's : Needs assistance/impaired Eating/Feeding: Modified independent;Bed level   Grooming: Oral care;Contact guard assist;Standing Grooming Details (indicate cue type and reason): Pt in significant pain at end of oral care requesting to return to supine. Upper Body Bathing: Set up;Sitting   Lower Body Bathing: Moderate assistance;Sitting/lateral leans;Sit to/from stand   Upper Body Dressing : Set up;Sitting   Lower Body Dressing: Moderate assistance;Sitting/lateral leans;Sit to/from stand   Toilet Transfer: Contact guard assist;Ambulation (cane) Toilet Transfer Details (indicate cue type and reason): CGA for safety. Decr step pattern and loses footing at times         Functional mobility during ADLs: Contact guard assist;Cane       Vision Baseline Vision/History: 1 Wears glasses Ability to See in Adequate Light: 0 Adequate Patient Visual Report: No change from baseline Vision Assessment?: No apparent visual deficits     Perception         Praxis         Pertinent Vitals/Pain Pain Assessment Pain Assessment: Faces Faces Pain Scale: Hurts whole lot Pain Location: area of abscess; back  Pain Descriptors / Indicators: Aching Pain Intervention(s): Monitored during session, Limited activity within patient's tolerance     Extremity/Trunk Assessment Upper Extremity Assessment Upper Extremity Assessment: Overall WFL for tasks assessed    Lower Extremity Assessment Lower Extremity Assessment: Defer to PT evaluation       Communication Communication Communication: No apparent difficulties   Cognition Arousal: Alert Behavior During Therapy: WFL for tasks assessed/performed Overall Cognitive Status: Within Functional Limits for tasks assessed                                       General Comments  HR up to 109 with mobility to sink; observing incr WOB but once able to get dynamap, pt SpO2 100%    Exercises     Shoulder Instructions      Home Living Family/patient expects to be discharged to:: Private residence Living Arrangements: Spouse/significant other Available Help at Discharge: Family Type of Home: House ([place]) Home Access: Stairs to enter Secretary/administrator of Steps: 5 Entrance Stairs-Rails: Right;Left Home Layout: One level     Bathroom Shower/Tub: Producer, television/film/video: Standard     Home Equipment: Agricultural consultant (2 wheels);Rollator (4 wheels);Cane - single point;Shower seat - built in          Prior Functioning/Environment Prior Level of Function : Independent/Modified Independent;History of Falls (last six months)             Mobility Comments: 3-4 falls in last 6 months. Uses cane at baseline ADLs Comments: Recently has been going to the gym for exercise, but has experienced falls getting out of the pool        OT Problem List: Decreased strength;Decreased activity tolerance;Impaired balance (sitting and/or standing)      OT Treatment/Interventions: Self-care/ADL training;Therapeutic exercise;DME and/or AE instruction;Balance training;Patient/family education;Therapeutic activities    OT Goals(Current goals can be found in the care plan section) Acute Rehab OT Goals Patient Stated Goal: get better OT Goal Formulation: With patient Time For Goal Achievement: 06/23/23 Potential to Achieve Goals: Good  OT Frequency: Min 1X/week     Co-evaluation              AM-PAC OT "6 Clicks" Daily Activity     Outcome Measure Help from another person eating meals?: None Help from another person taking care of personal grooming?: A Little Help from another person toileting, which includes using toliet, bedpan, or urinal?: A Little Help from another person bathing (including washing, rinsing, drying)?: A Lot Help from another person to put on and taking off regular upper body clothing?: A Little Help from another person to put on and taking off regular lower body clothing?: A Lot 6 Click Score: 17   End of Session Equipment Utilized During Treatment: Gait belt Nurse Communication: Mobility status  Activity Tolerance: Patient tolerated treatment well Patient left: in bed;with call bell/phone within reach;with bed alarm set  OT Visit Diagnosis: Unsteadiness on feet (R26.81);Muscle weakness (generalized) (M62.81);Pain;History of falling (Z91.81)                Time: 2130-8657 OT Time Calculation (min): 36 min Charges:  OT General Charges $OT Visit: 1 Visit OT Evaluation $OT Eval Low Complexity: 1 Low OT Treatments $Self Care/Home Management : 8-22 mins  Terri Heath, OTR/L [facility] Acute Rehabilitation Office: [phone]   Terri Heath 06/09/2023, 5:08 PM

## 2023-06-09 NOTE — Progress Notes (Signed)
PROGRESS NOTE  Terri Heath:[id number] DOB: 1956-03-24 DOA: 06/07/2023 PCP: Tollie Eth, NP   LOS: 2 days   Brief Narrative / Interim history: 67 year old female with history of recurrent labial abscess, DM2, morbid obesity, endometrial cancer s/p surgery/chemoradiation, chronic CHF with improved EF, CAD, PAD, MGUS, HTN, HLD who comes into the hospital with few days of drainage from the labial abscess.  She reports significant pain in that area.  This has been extremely malodorous.  She has had some fever and chills, being lightheaded at home.  In the ED she was found to be septic, received few IV boluses and OB/GYN was consulted  Subjective / 24h Interval events: Tells me that she continues to have significant pain in the groin area, especially when pain medication wears off  Assesement and Plan: Principal problem Sepsis due to left labial, concern for perineal abscess - spontaneously draining.  Very tender to palpation.  OB/GYN apparently consulted, appreciate input. -Has been placed on Vanco, Zosyn, continue.  Active problems AKI-baseline creatinine 0.9, elevated to 1.5 on admission.  Suspect in the setting of sepsis.  Still around 1.5 today.  Will watch carefully, she has good p.o. intake, encouraged to stay hydrated.  If continues to increase, may need to discuss with OB/GYN regarding antibiotics, Vanc\Zosyn combo  DM2, with hyperglycemia, uncontrolled-A1c 9.3 last month.  Continue insulin, sliding scale  Lab Results  Component Value Date   HGBA1C 9.3 (H) 05/03/2023   CBG (last 3)  Recent Labs    06/08/23 1724 06/08/23 2131 06/09/23 0755  GLUCAP 260* 227* 173*   Hypothyroidism-continue Synthroid  Left basilar density on chest x-ray-likely incidental, will repeat chest x-ray in a few weeks as an outpatient  Chronic combined CHF-recently EF has improved.  Seeing heart failure surgery.  Sepsis physiology improving, gradually resume home medications with Coreg  today.  Continue to hold furosemide, Entresto, spironolactone  CAD, PAD-continue on aspirin.  She is on Repatha as an outpatient  OSA-CPAP nightly  History of endometrial cancer status post surgery, chemoradiation-outpatient follow-up with Dr. Pricilla Holm  Scheduled Meds:  aspirin  81 mg Oral Daily   insulin aspart  0-15 Units Subcutaneous TID WC   insulin aspart  0-5 Units Subcutaneous QHS   insulin glargine-yfgn  25 Units Subcutaneous Daily   levothyroxine  50 mcg Oral Q0600   silver sulfADIAZINE   Topical TID   sodium chloride flush  3 mL Intravenous Q12H   Continuous Infusions:  piperacillin-tazobactam (ZOSYN)  IV 3.375 g (06/09/23 0655)   vancomycin Stopped (06/08/23 2055)   PRN Meds:.acetaminophen, albuterol, oxyCODONE **OR** oxyCODONE, polyethylene glycol  Current Outpatient Medications  Medication Instructions   acetaminophen (TYLENOL) 500-1,000 mg, Oral, Every 6 hours PRN   albuterol (PROAIR HFA) 108 (90 Base) MCG/ACT inhaler 2 puffs, Inhalation, Every 6 hours PRN, wheezing   aspirin 81 mg, Oral, Daily   blood glucose meter kit and supplies Use up to four times daily as directed. (FOR ICD-10 E10.9, E11.9).   carvedilol (COREG) 6.25 mg, Oral, 2 times daily   furosemide (LASIX) 20 mg, Oral, Daily   glucose blood test strip Use as directed to test blood sugar   Lantus SoloStar 25-40 Units, Subcutaneous, Daily   levothyroxine (SYNTHROID) 50 MCG tablet Take 1 tablet (50 mcg total) by mouth daily, before a meal.   magnesium oxide (MAG-OX) 400 mg, Oral, Daily   metFORMIN (GLUCOPHAGE) 1,000 mg, Oral, 2 times daily with meals   Mounjaro 2.5 mg, Subcutaneous, Weekly   Multiple Vitamin (  MULITIVITAMIN WITH MINERALS) TABS 1 tablet, Oral, Daily with breakfast   naproxen sodium (ALEVE) 440 mg, Oral, Daily PRN   OneTouch Delica Lancets 33G MISC use as directed   Repatha SureClick 140 mg, Subcutaneous, Every 14 days   sacubitril-valsartan (ENTRESTO) 24-26 MG 1 tablet, Oral, 2 times daily    spironolactone (ALDACTONE) 25 mg, Oral, Daily   triamcinolone ointment (KENALOG) 0.1 % Apply 1 application topically two times daily to affected area(s) as needed, sparing use to avoid whitening/thinning skin    Diet Orders (From admission, onward)     Start     Ordered   06/08/23 1035  Diet Carb Modified Fluid consistency: Thin; Room service appropriate? Yes  Diet effective now       Question Answer Comment  Diet-HS Snack? Nothing   Calorie Level Medium 1600-2000   Fluid consistency: Thin   Room service appropriate? Yes      06/08/23 1034            DVT prophylaxis: SCDs Start: 06/07/23 2228   Lab Results  Component Value Date   PLT 160 06/09/2023      Code Status: Full Code  Family Communication: no family at bedside   Status is: Inpatient Remains inpatient appropriate because: severity of illness  Level of care: Telemetry Medical  Consultants:  Gyn  Objective: Vitals:   06/08/23 1957 06/09/23 0354 06/09/23 0455 06/09/23 0724  BP: (!) 105/46  108/70 (!) 113/47  Pulse: 98  96 99  Resp: 18  18 16   Temp: 98.2 F (36.8 C)  98.3 F (36.8 C) 98.3 F (36.8 C)  TempSrc: Oral  Oral Oral  SpO2: 95%  98% 98%  Weight:  (!) 137.4 kg    Height:        Intake/Output Summary (Last 24 hours) at 06/09/2023 0945 Last data filed at 06/09/2023 0530 Gross per 24 hour  Intake 347.41 ml  Output --  Net 347.41 ml   Wt Readings from Last 3 Encounters:  06/09/23 (!) 137.4 kg  05/03/23 133.8 kg  02/06/23 133.8 kg    Examination:  Constitutional: NAD Eyes: lids and conjunctivae normal, no scleral icterus ENMT: mmm Neck: normal, supple Respiratory: clear to auscultation bilaterally, no wheezing, no crackles. Normal respiratory effort.  Cardiovascular: Regular rate and rhythm, no murmurs / rubs / gallops. 1+ LE edema. Abdomen: soft, no distention, no tenderness. Bowel sounds positive.    Data Reviewed: I have independently reviewed following labs and imaging  studies   CBC Recent Labs  Lab 06/07/23 1544 06/08/23 0427 06/09/23 0523  WBC 17.7* 18.1* 19.4*  HGB 11.0* 9.4* 10.1*  HCT 34.5* 29.7* 31.0*  PLT 123* 175 160  MCV 91.3 88.1 88.6  MCH 29.1 27.9 28.9  MCHC 31.9 31.6 32.6  RDW 13.8 14.0 14.4  LYMPHSABS 0.8  --   --   MONOABS 1.2*  --   --   EOSABS 0.0  --   --   BASOSABS 0.1  --   --     Recent Labs  Lab 06/07/23 1544 06/07/23 1622 06/07/23 1754 06/08/23 0427 06/09/23 0523  NA 134*  --   --  136 131*  K 4.5  --   --  4.3 4.0  CL 101  --   --  105 100  CO2 20*  --   --  18* 18*  GLUCOSE 224*  --   --  215* 198*  BUN 30*  --   --  28* 32*  CREATININE  1.50*  --   --  1.25* 1.52*  CALCIUM 8.6*  --   --  8.3* 8.3*  AST 13*  --   --   --  16  ALT 10  --   --   --  15  ALKPHOS 105  --   --   --  102  BILITOT 0.6  --   --   --  0.6  ALBUMIN 2.5*  --   --   --  1.9*  MG  --   --   --  1.8 2.0  LATICACIDVEN  --  2.2* 1.2  --   --   INR 1.2  --   --  1.3*  --     ------------------------------------------------------------------------------------------------------------------ No results for input(s): "CHOL", "HDL", "LDLCALC", "TRIG", "CHOLHDL", "LDLDIRECT" in the last 72 hours.  Lab Results  Component Value Date   HGBA1C 9.3 (H) 05/03/2023   ------------------------------------------------------------------------------------------------------------------ No results for input(s): "TSH", "T4TOTAL", "T3FREE", "THYROIDAB" in the last 72 hours.  Invalid input(s): "FREET3"  Cardiac Enzymes No results for input(s): "CKMB", "TROPONINI", "MYOGLOBIN" in the last 168 hours.  Invalid input(s): "CK" ------------------------------------------------------------------------------------------------------------------    Component Value Date/Time   BNP 33.4 08/03/2022 1506    CBG: Recent Labs  Lab 06/08/23 0905 06/08/23 1234 06/08/23 1724 06/08/23 2131 06/09/23 0755  GLUCAP 173* 190* 260* 227* 173*    Recent Results  (from the past 240 hour(s))  Blood Culture (routine x 2)     Status: None (Preliminary result)   Collection Time: 06/07/23  3:44 PM   Specimen: BLOOD  Result Value Ref Range Status   Specimen Description BLOOD RIGHT ANTECUBITAL  Final   Special Requests   Final    BOTTLES DRAWN AEROBIC AND ANAEROBIC Blood Culture results may not be optimal due to an excessive volume of blood received in culture bottles   Culture   Final    NO GROWTH < 24 HOURS Performed at [facility], [address] [place], [place] [address]    Report Status PENDING  Incomplete  Body fluid culture w Gram Stain     Status: None (Preliminary result)   Collection Time: 06/08/23  1:09 AM   Specimen: Abscess; Body Fluid  Result Value Ref Range Status   Specimen Description ABSCESS  Final   Special Requests NONE  Final   Gram Stain   Final    FEW WBC PRESENT, PREDOMINANTLY MONONUCLEAR MODERATE GRAM NEGATIVE RODS FEW GRAM POSITIVE COCCI IN CLUSTERS Performed at [facility], [address], [place], [place] [address]    Culture PENDING  Incomplete   Report Status PENDING  Incomplete  Blood Culture (routine x 2)     Status: None (Preliminary result)   Collection Time: 06/08/23  4:24 AM   Specimen: BLOOD RIGHT HAND  Result Value Ref Range Status   Specimen Description BLOOD RIGHT HAND  Final   Special Requests   Final    BOTTLES DRAWN AEROBIC AND ANAEROBIC Blood Culture adequate volume   Culture   Final    NO GROWTH < 12 HOURS Performed at [facility], [address], [place], [place] [address]    Report Status PENDING  Incomplete  MRSA Next Gen by PCR, Nasal     Status: None   Collection Time: 06/08/23  4:20 PM   Specimen: Nasal Mucosa; Nasal Swab  Result Value Ref Range Status   MRSA by PCR Next Gen NOT DETECTED NOT DETECTED Final  Comment: (NOTE) The GeneXpert MRSA Assay (FDA approved for NASAL specimens only), is one component of a comprehensive MRSA colonization  surveillance program. It is not intended to diagnose MRSA infection nor to guide or monitor treatment for MRSA infections. Test performance is not FDA approved in patients less than 42 years old. Performed at [facility], [address], [place], [place] [address]      Radiology Studies: No results found.   Pamella Pert, MD, PhD [facility]  Between 7 am - 7 pm I am available, please contact me via Amion (for emergencies) or Securechat (non urgent messages)  Between 7 pm - 7 am I am not available, please contact night coverage MD/APP via Amion

## 2023-06-10 DIAGNOSIS — N764 Abscess of vulva: Secondary | ICD-10-CM | POA: Diagnosis not present

## 2023-06-10 LAB — GLUCOSE, CAPILLARY
Glucose-Capillary: 177 mg/dL — ABNORMAL HIGH (ref 70–99)
Glucose-Capillary: 232 mg/dL — ABNORMAL HIGH (ref 70–99)
Glucose-Capillary: 255 mg/dL — ABNORMAL HIGH (ref 70–99)
Glucose-Capillary: 227 mg/dL — ABNORMAL HIGH (ref 70–99)

## 2023-06-10 LAB — COMPREHENSIVE METABOLIC PANEL
ALT: 13 U/L (ref 0–44)
AST: 13 U/L — ABNORMAL LOW (ref 15–41)
Albumin: 1.8 g/dL — ABNORMAL LOW (ref 3.5–5.0)
Alkaline Phosphatase: 102 U/L (ref 38–126)
Anion gap: 12 (ref 5–15)
BUN: 28 mg/dL — ABNORMAL HIGH (ref 8–23)
CO2: 19 mmol/L — ABNORMAL LOW (ref 22–32)
Calcium: 8.4 mg/dL — ABNORMAL LOW (ref 8.9–10.3)
Chloride: 102 mmol/L (ref 98–111)
Creatinine, Ser: 1.4 mg/dL — ABNORMAL HIGH (ref 0.44–1.00)
GFR, Estimated: 41 mL/min — ABNORMAL LOW (ref >60)
Glucose, Bld: 211 mg/dL — ABNORMAL HIGH (ref 70–99)
Potassium: 3.6 mmol/L (ref 3.5–5.1)
Sodium: 133 mmol/L — ABNORMAL LOW (ref 135–145)
Total Bilirubin: 0.5 mg/dL (ref 0.3–1.2)
Total Protein: 6.3 g/dL — ABNORMAL LOW (ref 6.5–8.1)

## 2023-06-10 LAB — VANCOMYCIN, PEAK: Vancomycin Pk: 40 ug/mL (ref 30–40)

## 2023-06-10 LAB — CBC
HCT: 28.6 % — ABNORMAL LOW (ref 36.0–46.0)
Hemoglobin: 9.5 g/dL — ABNORMAL LOW (ref 12.0–15.0)
MCH: 29 pg (ref 26.0–34.0)
MCHC: 33.2 g/dL (ref 30.0–36.0)
MCV: 87.2 fL (ref 80.0–100.0)
Platelets: 224 10*3/uL (ref 150–400)
RBC: 3.28 MIL/uL — ABNORMAL LOW (ref 3.87–5.11)
RDW: 14.7 % (ref 11.5–15.5)
WBC: 16.3 10*3/uL — ABNORMAL HIGH (ref 4.0–10.5)
nRBC: 0 % (ref 0.0–0.2)

## 2023-06-10 LAB — MAGNESIUM: Magnesium: 2.1 mg/dL (ref 1.7–2.4)

## 2023-06-10 MED ORDER — TRAMADOL HCL 50 MG PO TABS
50.0000 mg | ORAL_TABLET | Freq: Four times a day (QID) | ORAL | Status: DC | PRN
  Administered 2023-06-10 – 2023-06-14 (×15): 50 mg via ORAL
  Filled 2023-06-10 (×15): qty 1

## 2023-06-10 NOTE — Consult Note (Signed)
Follow up GYN consult note: HD#3 Left vulvar phlegmon on vanc/zosyn  Chief Complaint  Patient presents with   Abscess    Blood pressure (!) 148/59, pulse 94, temperature 98 F (36.7 C), temperature source Oral, resp. rate 16, height 5\' 7"  (1.702 m), weight (!) 137.4 kg, SpO2 97%.  Recommend: Continue Vanc/zosyn + silvadene for this likely polymicrobial infection process, even beyond the enterococcus + Strep anginosis, improving WBC Recommend imaging study(ask IR sonogram vs CT) tomorrow to see if there is now any accumulated fluid to drain, and would recommend IR approach as opposed to open traditional surgical I&D   G3P3 post menopausal with left vulvar phlegmon BC negative thus far Tissue culture: S anginosis + Enterococcus(see below) S/P neoadjuvant chemoRadiation s/p RA TLH BSO 02/2022 Long standing diabetes Hx of vulvar boil 20 years ago (this is not a recurrent abscess  Exam: Less tissue edema with edges of the process getting smaller Tissue is softer and coalescing the process-->may be reforming fluid in the vulva Definitely improving  Please call [phone] [facility] OB/GYN Consult Attending Monday-Friday 8am - 5pm) or [phone] [facility] OB/GYN Attending On Call all day, every day) for any  further gynecologic concerns at any time.  Thank you for involving [place] in the care of this patient.  Lazaro Arms, MD 06/10/2023 8:36 AM    Latest Ref Rng & Units 06/10/2023    4:53 AM 06/09/2023    5:23 AM 06/08/2023    4:27 AM  CBC  WBC 4.0 - 10.5 K/uL 16.3  19.4  18.1   Hemoglobin 12.0 - 15.0 g/dL 9.5  29.5  9.4   Hematocrit 36.0 - 46.0 % 28.6  31.0  29.7   Platelets 150 - 400 K/uL 224  160  175      Results for orders placed or performed during the hospital encounter of 06/07/23  Blood Culture (routine x 2)     Status: None (Preliminary result)   Collection Time: 06/07/23  3:44 PM   Specimen: BLOOD  Result Value Ref Range Status   Specimen Description BLOOD RIGHT  ANTECUBITAL  Final   Special Requests   Final    BOTTLES DRAWN AEROBIC AND ANAEROBIC Blood Culture results may not be optimal due to an excessive volume of blood received in culture bottles   Culture   Final    NO GROWTH 2 DAYS Performed at [facility], [address] [place], [place] [address]    Report Status PENDING  Incomplete  Body fluid culture w Gram Stain     Status: None (Preliminary result)   Collection Time: 06/08/23  1:09 AM   Specimen: Abscess; Body Fluid  Result Value Ref Range Status   Specimen Description ABSCESS  Final   Special Requests NONE  Final   Gram Stain   Final    FEW WBC PRESENT, PREDOMINANTLY MONONUCLEAR MODERATE GRAM NEGATIVE RODS FEW GRAM POSITIVE COCCI IN CLUSTERS    Culture   Final    FEW STREPTOCOCCUS ANGINOSIS FEW ENTEROCOCCUS FAECALIS CULTURE REINCUBATED FOR BETTER GROWTH Performed at [facility], [address], [place], [place] [address]    Report Status PENDING  Incomplete  Blood Culture (routine x 2)     Status: None (Preliminary result)   Collection Time: 06/08/23  4:24 AM   Specimen: BLOOD RIGHT HAND  Result Value Ref Range Status   Specimen Description BLOOD RIGHT HAND  Final   Special Requests   Final    BOTTLES DRAWN AEROBIC  AND ANAEROBIC Blood Culture adequate volume   Culture   Final    NO GROWTH 1 DAY Performed at [facility], [address], [place], [place] [address]    Report Status PENDING  Incomplete  MRSA Next Gen by PCR, Nasal     Status: None   Collection Time: 06/08/23  4:20 PM   Specimen: Nasal Mucosa; Nasal Swab  Result Value Ref Range Status   MRSA by PCR Next Gen NOT DETECTED NOT DETECTED Final    Comment: (NOTE) The GeneXpert MRSA Assay (FDA approved for NASAL specimens only), is one component of a comprehensive MRSA colonization surveillance program. It is not intended to diagnose MRSA infection nor to guide or monitor treatment for MRSA infections. Test performance is not FDA approved  in patients less than 55 years old. Performed at [facility], [address] [place], [place] [address]        MEDS ordered this encounter: Meds ordered this encounter  Medications   sodium chloride 0.9 % bolus 1,000 mL   cefTRIAXone (ROCEPHIN) 2 g in sodium chloride 0.9 % 100 mL IVPB    Order Specific Question:   Antibiotic Indication:    Answer:   Cellulitis   DISCONTD: vancomycin (VANCOCIN) IVPB 1000 mg/200 mL premix    Order Specific Question:   Indication:    Answer:   Cellulitis   HYDROmorphone (DILAUDID) injection 0.5 mg   vancomycin (VANCOCIN) 2,500 mg in sodium chloride 0.9 % 500 mL IVPB    Order Specific Question:   Indication:    Answer:   Sepsis   iohexol (OMNIPAQUE) 350 MG/ML injection 65 mL   sodium chloride 0.9 % bolus 1,000 mL   DISCONTD: metroNIDAZOLE (FLAGYL) tablet 500 mg   DISCONTD: cefTRIAXone (ROCEPHIN) 2 g in sodium chloride 0.9 % 100 mL IVPB    Order Specific Question:   Antibiotic Indication:    Answer:   Cellulitis    Order Specific Question:   Other Indication:    Answer:   abscess, sepsis   sodium chloride flush (NS) 0.9 % injection 3 mL   insulin glargine-yfgn (SEMGLEE) injection 25 Units   acetaminophen (TYLENOL) tablet 1,000 mg   polyethylene glycol (MIRALAX / GLYCOLAX) packet 17 g   insulin aspart (novoLOG) injection 0-15 Units    Order Specific Question:   Correction coverage:    Answer:   Moderate (average weight, post-op)    Order Specific Question:   CBG < 70:    Answer:   Implement Hypoglycemia Standing Orders and refer to Hypoglycemia Standing Orders sidebar report    Order Specific Question:   CBG 70 - 120:    Answer:   0 units    Order Specific Question:   CBG 121 - 150:    Answer:   2 units    Order Specific Question:   CBG 151 - 200:    Answer:   3 units    Order Specific Question:   CBG 201 - 250:    Answer:   5 units    Order Specific Question:   CBG 251 - 300:    Answer:   8 units    Order Specific Question:   CBG  301 - 350:    Answer:   11 units    Order Specific Question:   CBG 351 - 400:    Answer:   15 units    Order Specific Question:   CBG > 400    Answer:  call MD and obtain STAT lab verification   insulin aspart (novoLOG) injection 0-5 Units    Order Specific Question:   Correction coverage:    Answer:   HS scale    Order Specific Question:   CBG < 70:    Answer:   Implement Hypoglycemia Standing Orders and refer to Hypoglycemia Standing Orders sidebar report    Order Specific Question:   CBG 70 - 120:    Answer:   0 units    Order Specific Question:   CBG 121 - 150:    Answer:   0 units    Order Specific Question:   CBG 151 - 200:    Answer:   0 units    Order Specific Question:   CBG 201 - 250:    Answer:   2 units    Order Specific Question:   CBG 251 - 300:    Answer:   3 units    Order Specific Question:   CBG 301 - 350:    Answer:   4 units    Order Specific Question:   CBG 351 - 400:    Answer:   5 units    Order Specific Question:   CBG > 400    Answer:   call MD and obtain STAT lab verification   aspirin chewable tablet 81 mg   levothyroxine (SYNTHROID) tablet 50 mcg   albuterol (PROVENTIL) (2.5 MG/3ML) 0.083% nebulizer solution 2.5 mg    wheezing     DISCONTD: vancomycin (VANCOREADY) IVPB 1500 mg/300 mL    Order Specific Question:   Indication:    Answer:   Other Indication (list below)    Order Specific Question:   Other Indication:    Answer:   abscess   OR Linked Order Group    oxyCODONE (Oxy IR/ROXICODONE) immediate release tablet 2.5 mg    oxyCODONE (Oxy IR/ROXICODONE) immediate release tablet 5 mg   vancomycin (VANCOREADY) IVPB 1250 mg/250 mL    Order Specific Question:   Indication:    Answer:   Other Indication (list below)    Order Specific Question:   Other Indication:    Answer:   abscess   piperacillin-tazobactam (ZOSYN) IVPB 3.375 g    Order Specific Question:   Antibiotic Indication:    Answer:   Wound Infection   DISCONTD: piperacillin-tazobactam  (ZOSYN) IVPB 3.375 g    Order Specific Question:   Antibiotic Indication:    Answer:   Other Indication (list below)    Order Specific Question:   Other Indication:    Answer:   diabetic associated gluteal + vulvar abscess/phlegmon   silver sulfADIAZINE (SILVADENE) 1 % cream   carvedilol (COREG) tablet 3.125 mg   sodium chloride 0.9 % bolus 500 mL   magnesium hydroxide (MILK OF MAGNESIA) suspension 30 mL    Orders for this encounter: Orders Placed This Encounter  Procedures   Critical Care   Blood Culture (routine x 2)   Body fluid culture w Gram Stain   MRSA Next Gen by PCR, Nasal   DG Chest Port 1 View   CT ABDOMEN PELVIS W CONTRAST   Comprehensive metabolic panel   CBC with Differential   Protime-INR   APTT   Urinalysis, w/ Reflex to Culture (Infection Suspected) -Urine, Clean Catch   HIV Antibody (routine testing w rflx)   Basic metabolic panel   CBC   Magnesium   Phosphorus   Protime-INR   Comprehensive metabolic panel   CBC  Magnesium   Glucose, capillary   Glucose, capillary   Glucose, capillary   Comprehensive metabolic panel   CBC   Magnesium   Glucose, capillary   Glucose, capillary   Glucose, capillary   Glucose, capillary   Diet Carb Modified Fluid consistency: Thin; Room service appropriate? Yes   Document height and weight   Assess and Document Glasgow Coma Scale   Document vital signs within 1-hour of fluid bolus completion.  Notify provider of abnormal vital signs despite fluid resuscitation.   Refer to Sidebar Report: Sepsis Bundle ED/IP   Notify provider for difficulties obtaining IV access   Initiate Carrier Fluid Protocol   DO NOT delay antibiotics if unable to obtain blood culture.   Patient may eat/drink   Vital signs   Notify physician (specify)   Refer to Sidebar Report Refer to ICU, Med-Surg, Progressive, and Step-Down Mobility Protocol Sidebars   Do not place and if present remove PureWick   Initiate Oral Care Protocol   Initiate  Carrier Fluid Protocol   RN may order General Admission PRN Orders utilizing "General Admission PRN medications" (through manage orders) for the following patient needs: allergy symptoms (Claritin), cold sores (Carmex), cough (Robitussin DM), eye irritation (Liquifilm Tears), hemorrhoids (Tucks), indigestion (Maalox), minor skin irritation (Hydrocortisone Cream), muscle pain Romeo Apple Gay), nose irritation (saline nasal spray) and sore throat (Chloraseptic spray).   SCDs   Patient has an active order for admit to inpatient/place in observation   Maintain IV access   Up with assistance   Apply Diabetes Mellitus Care Plan   STAT CBG when hypoglycemia is suspected. If treated, recheck every 15 minutes after each treatment until CBG >/= 70 mg/dl   Refer to Hypoglycemia Protocol Sidebar Report for treatment of CBG < 70 mg/dl   Daily weights   Intake and Output   Swab Process:   Considerations:   If MRSA PCR Screen is Positive:   Cardiac Monitoring - Continuous Indefinite   Full code   Code Sepsis activation.  This occurs automatically when order is signed and prioritizes pharmacy, lab, and radiology services for STAT collections and interventions.  If [facility] downtime, call [facility] [phone]) to activate Code Sepsis.   Consult to general surgery   Consult to hospitalist   vancomycin per pharmacy consult   OT eval and treat   Oxygen therapy Mode or (Route): Nasal cannula; Liters Per Minute: 2; Keep 02 saturation: greater than 92 %   CPAP   I-Stat Lactic Acid, ED   CBG monitoring, ED   CBG monitoring, ED   CBG monitoring, ED   EKG   EKG   EKG   EKG   EKG   EKG   Insert peripheral IV X 1   Insert 2nd peripheral IV if not already present.   Admit to Inpatient (patient's expected length of stay will be greater than 2 midnights or inpatient only procedure)   Admit to Inpatient (patient's expected length of stay will be greater than 2 midnights or inpatient only procedure)    Impression +  Management Plan   ICD-10-CM   1. Cellulitis of perineum  L03.315     2. Labial abscess  N76.4       Follow Up: No follow-ups on file.     All questions were answered.  Past Medical History:  Diagnosis Date   Abnormal vaginal bleeding in postmenopausal patient 10/11/2021   Acute systolic heart failure (HCC)    Allergy    Asthma    Bilateral  shoulder pain    Borderline hypertension    Bronchitis    CAD (coronary artery disease)    Chronic systolic CHF (congestive heart failure) (HCC)    Chronic systolic congestive heart failure, NYHA class 3 (HCC) 07/24/2018   Closed fracture of distal fibula 07/17/2022   Dehydration 12/20/2021   Diabetes mellitus    Diagnosed in 2000, on insulin, Trulicity and metformin, not checking cgs at home   Drug-induced hypotension 12/20/2021   Elevated serum creatinine 12/20/2021   Gangrenous appendicitis with perforation s/p lap appendectomy 10/04/2017 10/04/2017   GERD (gastroesophageal reflux disease)    History of MI (myocardial infarction)    History of radiation therapy    Uterus- 12/14/21-02/01/22- Dr. Antony Blackbird   Hyperlipidemia    Hypertension    Hypothyroidism    MGUS (monoclonal gammopathy of unknown significance)    Mitral regurgitation    Myocardial infarction (HCC)    Neuropathy    Obesity    Rash of back 01/06/2021   Sleep apnea    uses CPAP   Statin intolerance    Uterine cancer [facility])     Past Surgical History:  Procedure Laterality Date   BREATH TEK H PYLORI  09/18/2011   Procedure: BREATH TEK H PYLORI;  Surgeon: Kandis Cocking, MD;  Location: [facility];  Service: General;  Laterality: N/A;   CESAREAN SECTION     x 3   IR IMAGING GUIDED PORT INSERTION  12/07/2021   IR REMOVAL TUN ACCESS W/ PORT W/O FL MOD SED  04/04/2022   LAPAROSCOPIC APPENDECTOMY N/A 10/03/2017   Procedure: APPENDECTOMY LAPAROSCOPIC;  Surgeon: Romie Levee, MD;  Location: [facility];  Service: General;  Laterality: N/A;   OPERATIVE ULTRASOUND N/A  02/01/2022   Procedure: OPERATIVE ULTRASOUND;  Surgeon: Antony Blackbird, MD;  Location: [facility];  Service: Urology;  Laterality: N/A;   RIGHT/LEFT HEART CATH AND CORONARY ANGIOGRAPHY N/A 02/12/2018   Procedure: RIGHT/LEFT HEART CATH AND CORONARY ANGIOGRAPHY;  Surgeon: Corky Crafts, MD;  Location: [facility] INVASIVE CV LAB;  Service: Cardiovascular;  Laterality: N/A;   ROBOTIC ASSISTED TOTAL HYSTERECTOMY WITH BILATERAL SALPINGO OOPHERECTOMY Bilateral 03/21/2022   Procedure: XI ROBOTIC ASSISTED TOTAL HYSTERECTOMY WITH BILATERAL SALPINGO OOPHORECTOMY, LYSIS OF ADHESIONS, CYSTOSCOPY;  Surgeon: Carver Fila, MD;  Location: [facility];  Service: Gynecology;  Laterality: Bilateral;   TANDEM RING INSERTION N/A 02/01/2022   Procedure: TANDEM RING INSERTION;  Surgeon: Antony Blackbird, MD;  Location: [facility];  Service: Urology;  Laterality: N/A;   TUBAL LIGATION      OB History     Gravida  3   Para  3   Term      Preterm      AB      Living         SAB      IAB      Ectopic      Multiple      Live Births              Allergies  Allergen Reactions   Amoxicillin Other (See Comments)    Dizziness, abdominal pain   Adhesive [Tape] Rash    Tegaderm   Exenatide Other (See Comments)    Flu-like symptoms   Invokana [Canagliflozin] Other (See Comments)    Yeast infection/dehydration   Jardiance [Empagliflozin] Other (See Comments)    Yeast infections and dehydration   Lisinopril Other (See Comments)    cramping   Other Diarrhea and Other (See Comments)  Kale=food  Per patient, it causes GI bleeding    Statins Other (See Comments)    Leg cramps    Social History   Socioeconomic History   Marital status: Married    Spouse name: Not on file   Number of children: Not on file   Years of education: Not on file   Highest education level: Not on file  Occupational History   Occupation: retired - EKG tech, worked at [employer]  Tobacco Use   Smoking status: Former    Current  packs/day: 0.00    Average packs/day: 1 pack/day for 15.0 years (15.0 ttl pk-yrs)    Types: Cigarettes    Start date: 01/29/1975    Quit date: 01/28/1990    Years since quitting: 33.3   Smokeless tobacco: Never  Vaping Use   Vaping status: Never Used  Substance and Sexual Activity   Alcohol use: Not Currently   Drug use: No   Sexual activity: Not Currently    Birth control/protection: Post-menopausal, Abstinence  Other Topics Concern   Not on file  Social History Narrative   Lives in [place]   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   Social Determinants of Health   Financial Resource Strain: Low Risk  (05/03/2023)   Overall Financial Resource Strain (CARDIA)    Difficulty of Paying Living Expenses: Not very hard  Food Insecurity: No Food Insecurity (06/07/2023)   Hunger Vital Sign    Worried About Running Out of Food in the Last Year: Never true    Ran Out of Food in the Last Year: Never true  Transportation Needs: No Transportation Needs (06/07/2023)   PRAPARE - Administrator, Civil Service (Medical): No    Lack of Transportation (Non-Medical): No  Physical Activity: Insufficiently Active (05/03/2023)   Exercise Vital Sign    Days of Exercise per Week: 1 day    Minutes of Exercise per Session: 30 min  Stress: No Stress Concern Present (05/03/2023)   [employer] of Occupational Health - Occupational Stress Questionnaire    Feeling of Stress : Only a little  Social Connections: Unknown (05/03/2023)   Social Connection and Isolation Panel [NHANES]    Frequency of Communication with Friends and Family: Three times a week    Frequency of Social Gatherings with Friends and Family: Once a week    Attends Religious Services: Patient unable to answer    Active Member of Clubs or Organizations: Yes    Attends Banker Meetings: Never    Marital Status: Married    Family History  Problem Relation Age of Onset   Alcohol abuse Mother    Arthritis  Mother    Diabetes Mother    Sleep apnea Mother    Anxiety disorder Mother    Eating disorder Mother    Obesity Mother    Cancer Father    Alcohol abuse Father    Heart disease Father    Hypertension Father    Stroke Father    Sleep apnea Sister    Breast cancer Neg Hx    Colon cancer Neg Hx    Ovarian cancer Neg Hx    Endometrial cancer Neg Hx    Pancreatic cancer Neg Hx    Prostate cancer Neg Hx

## 2023-06-10 NOTE — Progress Notes (Signed)
   06/10/23 2257  BiPAP/CPAP/SIPAP  BiPAP/CPAP/SIPAP Pt Type Adult  Mask Type Nasal mask  Patient Home Equipment Yes  Safety Check Completed by RT for Home Unit Yes, no issues noted  BiPAP/CPAP /SiPAP Vitals  Pulse Rate 82  Resp 20  SpO2 98 %  Bilateral Breath Sounds Expiratory wheezes;Diminished  MEWS Score/Color  MEWS Score 0  MEWS Score Color Green   Pt places herself on/off home unit. Pt states she doesn't need assistance.

## 2023-06-10 NOTE — Progress Notes (Addendum)
PROGRESS NOTE  Terri ZERBA ZOX:[id number] DOB: Feb 15, 1956 DOA: 06/07/2023 PCP: Tollie Eth, NP   LOS: 3 days   Brief Narrative / Interim history: 67 year old female with history of recurrent labial abscess, DM2, morbid obesity, endometrial cancer s/p surgery/chemoradiation, chronic CHF with improved EF, CAD, PAD, MGUS, HTN, HLD who comes into the hospital with few days of drainage from the labial abscess.  She reports significant pain in that area.  This has been extremely malodorous.  She has had some fever and chills, being lightheaded at home.  In the ED she was found to be septic, received few IV boluses and OB/GYN was consulted  Subjective / 24h Interval events: Multiple complaints this morning regarding that her IV came out, also about IV pump beeping throughout the night.  Also thinks that oxycodone may be causing hallucinations  Assesement and Plan: Principal problem Sepsis due to left labial, concern for perineal abscess - spontaneously draining.  Very tender to palpation.  OB/GYN apparently consulted, appreciate input. -For now continue broad-spectrum antibiotics.  May need repeat imaging, per OB/GYN -Cultures obtained from drainage shows few strep anginosus and Enterococcus faecalis.  Sensitivities pending  Active problems AKI-baseline creatinine 0.9, elevated to 1.5 on admission.  Suspect in the setting of sepsis.  Stable, 1.4 today.  Will watch carefully, she has good p.o. intake, encouraged to stay hydrated.  If continues to increase, may need to discuss with OB/GYN regarding antibiotics, Vanc\Zosyn combo  DM2, with hyperglycemia, uncontrolled-A1c 9.3 last month.  Continue insulin, sliding scale  Lab Results  Component Value Date   HGBA1C 9.3 (H) 05/03/2023   CBG (last 3)  Recent Labs    06/09/23 1618 06/09/23 2006 06/10/23 0716  GLUCAP 199* 196* 177*   Hypothyroidism-continue Synthroid  Left basilar density on chest x-ray-likely incidental, will repeat  chest x-ray in a few weeks as an outpatient  Chronic combined CHF-recently EF has improved.  Seeing heart failure surgery.  Sepsis physiology improving, gradually resume home medications with Coreg today.  Continue to hold furosemide, Entresto, spironolactone.  Blood pressure gradually improving, may need to resume other meds tomorrow  CAD, PAD-continue on aspirin.  She is on Repatha as an outpatient  OSA-CPAP nightly  History of endometrial cancer status post surgery, chemoradiation-outpatient follow-up with Dr. Pricilla Holm  Obesity, morbid-BMI 47  Scheduled Meds:  aspirin  81 mg Oral Daily   carvedilol  3.125 mg Oral BID WC   insulin aspart  0-15 Units Subcutaneous TID WC   insulin aspart  0-5 Units Subcutaneous QHS   insulin glargine-yfgn  25 Units Subcutaneous Daily   levothyroxine  50 mcg Oral Q0600   silver sulfADIAZINE   Topical TID   sodium chloride flush  3 mL Intravenous Q12H   Continuous Infusions:  piperacillin-tazobactam (ZOSYN)  IV 3.375 g (06/10/23 0657)   vancomycin 1,250 mg (06/09/23 1752)   PRN Meds:.acetaminophen, albuterol, magnesium hydroxide, polyethylene glycol, traMADol  Current Outpatient Medications  Medication Instructions   acetaminophen (TYLENOL) 500-1,000 mg, Oral, Every 6 hours PRN   albuterol (PROAIR HFA) 108 (90 Base) MCG/ACT inhaler 2 puffs, Inhalation, Every 6 hours PRN, wheezing   aspirin 81 mg, Oral, Daily   blood glucose meter kit and supplies Use up to four times daily as directed. (FOR ICD-10 E10.9, E11.9).   carvedilol (COREG) 6.25 mg, Oral, 2 times daily   furosemide (LASIX) 20 mg, Oral, Daily   glucose blood test strip Use as directed to test blood sugar   Lantus SoloStar 25-40 Units, Subcutaneous,  Daily   levothyroxine (SYNTHROID) 50 MCG tablet Take 1 tablet (50 mcg total) by mouth daily, before a meal.   magnesium oxide (MAG-OX) 400 mg, Oral, Daily   metFORMIN (GLUCOPHAGE) 1,000 mg, Oral, 2 times daily with meals   Mounjaro 2.5 mg,  Subcutaneous, Weekly   Multiple Vitamin (MULITIVITAMIN WITH MINERALS) TABS 1 tablet, Oral, Daily with breakfast   naproxen sodium (ALEVE) 440 mg, Oral, Daily PRN   OneTouch Delica Lancets 33G MISC use as directed   Repatha SureClick 140 mg, Subcutaneous, Every 14 days   sacubitril-valsartan (ENTRESTO) 24-26 MG 1 tablet, Oral, 2 times daily   spironolactone (ALDACTONE) 25 mg, Oral, Daily   triamcinolone ointment (KENALOG) 0.1 % Apply 1 application topically two times daily to affected area(s) as needed, sparing use to avoid whitening/thinning skin    Diet Orders (From admission, onward)     Start     Ordered   06/08/23 1035  Diet Carb Modified Fluid consistency: Thin; Room service appropriate? Yes  Diet effective now       Question Answer Comment  Diet-HS Snack? Nothing   Calorie Level Medium 1600-2000   Fluid consistency: Thin   Room service appropriate? Yes      06/08/23 1034            DVT prophylaxis: SCDs Start: 06/07/23 2228   Lab Results  Component Value Date   PLT 224 06/10/2023      Code Status: Full Code  Family Communication: no family at bedside   Status is: Inpatient Remains inpatient appropriate because: severity of illness  Level of care: Telemetry Medical  Consultants:  Gyn  Objective: Vitals:   06/09/23 2007 06/10/23 0103 06/10/23 0525 06/10/23 0713  BP: (!) 117/55 132/72 (!) 107/59 (!) 148/59  Pulse: 94 95 90 94  Resp: 18 20 17 16   Temp: 98.8 F (37.1 C) 98.6 F (37 C) 98.7 F (37.1 C) 98 F (36.7 C)  TempSrc: Oral Oral Oral Oral  SpO2: 100% 100% 97% 97%  Weight:      Height:        Intake/Output Summary (Last 24 hours) at 06/10/2023 1035 Last data filed at 06/10/2023 0700 Gross per 24 hour  Intake 120 ml  Output 120 ml  Net 0 ml   Wt Readings from Last 3 Encounters:  06/09/23 (!) 137.4 kg  05/03/23 133.8 kg  02/06/23 133.8 kg    Examination:  Constitutional: NAD Eyes: lids and conjunctivae normal, no scleral  icterus ENMT: mmm Neck: normal, supple Respiratory: clear to auscultation bilaterally, no wheezing, no crackles. Normal respiratory effort.  Cardiovascular: Regular rate and rhythm, no murmurs / rubs / gallops. 1+ LE edema. Abdomen: soft, no distention, no tenderness. Bowel sounds positive.  Skin: no rashes   Data Reviewed: I have independently reviewed following labs and imaging studies   CBC Recent Labs  Lab 06/07/23 1544 06/08/23 0427 06/09/23 0523 06/10/23 0453  WBC 17.7* 18.1* 19.4* 16.3*  HGB 11.0* 9.4* 10.1* 9.5*  HCT 34.5* 29.7* 31.0* 28.6*  PLT 123* 175 160 224  MCV 91.3 88.1 88.6 87.2  MCH 29.1 27.9 28.9 29.0  MCHC 31.9 31.6 32.6 33.2  RDW 13.8 14.0 14.4 14.7  LYMPHSABS 0.8  --   --   --   MONOABS 1.2*  --   --   --   EOSABS 0.0  --   --   --   BASOSABS 0.1  --   --   --     Recent Labs  Lab 06/07/23 1544 06/07/23 1622 06/07/23 1754 06/08/23 0427 06/09/23 0523 06/10/23 0453  NA 134*  --   --  136 131* 133*  K 4.5  --   --  4.3 4.0 3.6  CL 101  --   --  105 100 102  CO2 20*  --   --  18* 18* 19*  GLUCOSE 224*  --   --  215* 198* 211*  BUN 30*  --   --  28* 32* 28*  CREATININE 1.50*  --   --  1.25* 1.52* 1.40*  CALCIUM 8.6*  --   --  8.3* 8.3* 8.4*  AST 13*  --   --   --  16 13*  ALT 10  --   --   --  15 13  ALKPHOS 105  --   --   --  102 102  BILITOT 0.6  --   --   --  0.6 0.5  ALBUMIN 2.5*  --   --   --  1.9* 1.8*  MG  --   --   --  1.8 2.0 2.1  LATICACIDVEN  --  2.2* 1.2  --   --   --   INR 1.2  --   --  1.3*  --   --     ------------------------------------------------------------------------------------------------------------------ No results for input(s): "CHOL", "HDL", "LDLCALC", "TRIG", "CHOLHDL", "LDLDIRECT" in the last 72 hours.  Lab Results  Component Value Date   HGBA1C 9.3 (H) 05/03/2023   ------------------------------------------------------------------------------------------------------------------ No results for input(s):  "TSH", "T4TOTAL", "T3FREE", "THYROIDAB" in the last 72 hours.  Invalid input(s): "FREET3"  Cardiac Enzymes No results for input(s): "CKMB", "TROPONINI", "MYOGLOBIN" in the last 168 hours.  Invalid input(s): "CK" ------------------------------------------------------------------------------------------------------------------    Component Value Date/Time   BNP 33.4 08/03/2022 1506    CBG: Recent Labs  Lab 06/09/23 0755 06/09/23 1132 06/09/23 1618 06/09/23 2006 06/10/23 0716  GLUCAP 173* 206* 199* 196* 177*    Recent Results (from the past 240 hour(s))  Blood Culture (routine x 2)     Status: None (Preliminary result)   Collection Time: 06/07/23  3:44 PM   Specimen: BLOOD  Result Value Ref Range Status   Specimen Description BLOOD RIGHT ANTECUBITAL  Final   Special Requests   Final    BOTTLES DRAWN AEROBIC AND ANAEROBIC Blood Culture results may not be optimal due to an excessive volume of blood received in culture bottles   Culture   Final    NO GROWTH 3 DAYS Performed at [facility], [address], [place], [place] [address]    Report Status PENDING  Incomplete  Body fluid culture w Gram Stain     Status: None (Preliminary result)   Collection Time: 06/08/23  1:09 AM   Specimen: Abscess; Body Fluid  Result Value Ref Range Status   Specimen Description ABSCESS  Final   Special Requests NONE  Final   Gram Stain   Final    FEW WBC PRESENT, PREDOMINANTLY MONONUCLEAR MODERATE GRAM NEGATIVE RODS FEW GRAM POSITIVE COCCI IN CLUSTERS    Culture   Final    FEW STREPTOCOCCUS ANGINOSIS FEW ENTEROCOCCUS FAECALIS CULTURE REINCUBATED FOR BETTER GROWTH Performed at [facility], [address], [place], [place] [address]    Report Status PENDING  Incomplete  Blood Culture (routine x 2)     Status: None (Preliminary result)   Collection Time: 06/08/23  4:24 AM   Specimen: BLOOD RIGHT HAND  Result Value  Ref Range Status   Specimen Description BLOOD RIGHT HAND   Final   Special Requests   Final    BOTTLES DRAWN AEROBIC AND ANAEROBIC Blood Culture adequate volume   Culture   Final    NO GROWTH 2 DAYS Performed at [facility], [address], [place], [place] [address]    Report Status PENDING  Incomplete  MRSA Next Gen by PCR, Nasal     Status: None   Collection Time: 06/08/23  4:20 PM   Specimen: Nasal Mucosa; Nasal Swab  Result Value Ref Range Status   MRSA by PCR Next Gen NOT DETECTED NOT DETECTED Final    Comment: (NOTE) The GeneXpert MRSA Assay (FDA approved for NASAL specimens only), is one component of a comprehensive MRSA colonization surveillance program. It is not intended to diagnose MRSA infection nor to guide or monitor treatment for MRSA infections. Test performance is not FDA approved in patients less than 19 years old. Performed at [facility], [address], [place], [place] [address]      Radiology Studies: No results found.   Pamella Pert, MD, PhD [facility]  Between 7 am - 7 pm I am available, please contact me via Amion (for emergencies) or Securechat (non urgent messages)  Between 7 pm - 7 am I am not available, please contact night coverage MD/APP via Amion

## 2023-06-11 DIAGNOSIS — E1165 Type 2 diabetes mellitus with hyperglycemia: Secondary | ICD-10-CM | POA: Diagnosis not present

## 2023-06-11 DIAGNOSIS — L03315 Cellulitis of perineum: Secondary | ICD-10-CM

## 2023-06-11 DIAGNOSIS — N764 Abscess of vulva: Secondary | ICD-10-CM | POA: Diagnosis not present

## 2023-06-11 HISTORY — DX: Cellulitis of perineum: L03.315

## 2023-06-11 LAB — BASIC METABOLIC PANEL
Anion gap: 9 (ref 5–15)
BUN: 23 mg/dL (ref 8–23)
CO2: 21 mmol/L — ABNORMAL LOW (ref 22–32)
Calcium: 8.5 mg/dL — ABNORMAL LOW (ref 8.9–10.3)
Chloride: 103 mmol/L (ref 98–111)
Creatinine, Ser: 1.19 mg/dL — ABNORMAL HIGH (ref 0.44–1.00)
GFR, Estimated: 50 mL/min — ABNORMAL LOW (ref >60)
Glucose, Bld: 211 mg/dL — ABNORMAL HIGH (ref 70–99)
Potassium: 3.4 mmol/L — ABNORMAL LOW (ref 3.5–5.1)
Sodium: 133 mmol/L — ABNORMAL LOW (ref 135–145)

## 2023-06-11 LAB — CBC
HCT: 30.6 % — ABNORMAL LOW (ref 36.0–46.0)
Hemoglobin: 9.9 g/dL — ABNORMAL LOW (ref 12.0–15.0)
MCH: 28 pg (ref 26.0–34.0)
MCHC: 32.4 g/dL (ref 30.0–36.0)
MCV: 86.4 fL (ref 80.0–100.0)
Platelets: 178 10*3/uL (ref 150–400)
RBC: 3.54 MIL/uL — ABNORMAL LOW (ref 3.87–5.11)
RDW: 15.2 % (ref 11.5–15.5)
WBC: 12.7 10*3/uL — ABNORMAL HIGH (ref 4.0–10.5)
nRBC: 0 % (ref 0.0–0.2)

## 2023-06-11 LAB — MAGNESIUM: Magnesium: 2.1 mg/dL (ref 1.7–2.4)

## 2023-06-11 LAB — GLUCOSE, CAPILLARY
Glucose-Capillary: 236 mg/dL — ABNORMAL HIGH (ref 70–99)
Glucose-Capillary: 264 mg/dL — ABNORMAL HIGH (ref 70–99)
Glucose-Capillary: 261 mg/dL — ABNORMAL HIGH (ref 70–99)
Glucose-Capillary: 162 mg/dL — ABNORMAL HIGH (ref 70–99)
Glucose-Capillary: 150 mg/dL — ABNORMAL HIGH (ref 70–99)

## 2023-06-11 LAB — VANCOMYCIN, TROUGH: Vancomycin Tr: 13 ug/mL — ABNORMAL LOW (ref 15–20)

## 2023-06-11 MED ORDER — INSULIN GLARGINE-YFGN 100 UNIT/ML ~~LOC~~ SOLN
3.0000 [IU] | Freq: Once | SUBCUTANEOUS | Status: AC
  Administered 2023-06-11: 3 [IU] via SUBCUTANEOUS
  Filled 2023-06-11: qty 0.03

## 2023-06-11 MED ORDER — VANCOMYCIN HCL IN DEXTROSE 1-5 GM/200ML-% IV SOLN
1000.0000 mg | INTRAVENOUS | Status: DC
  Administered 2023-06-12: 1000 mg via INTRAVENOUS
  Filled 2023-06-11: qty 200

## 2023-06-11 MED ORDER — INSULIN GLARGINE-YFGN 100 UNIT/ML ~~LOC~~ SOLN
28.0000 [IU] | Freq: Every day | SUBCUTANEOUS | Status: DC
  Administered 2023-06-12: 28 [IU] via SUBCUTANEOUS
  Filled 2023-06-11: qty 0.28

## 2023-06-11 MED ORDER — ENOXAPARIN SODIUM 40 MG/0.4ML IJ SOSY
40.0000 mg | PREFILLED_SYRINGE | INTRAMUSCULAR | Status: DC
  Administered 2023-06-11 – 2023-06-13 (×3): 40 mg via SUBCUTANEOUS
  Filled 2023-06-11 (×4): qty 0.4

## 2023-06-11 MED ORDER — INSULIN ASPART 100 UNIT/ML IJ SOLN
3.0000 [IU] | Freq: Three times a day (TID) | INTRAMUSCULAR | Status: DC
  Administered 2023-06-11 – 2023-06-14 (×10): 3 [IU] via SUBCUTANEOUS

## 2023-06-11 NOTE — Progress Notes (Signed)
Pharmacy Antibiotic Note  Terri Heath is a 67 y.o. female presents on 06/07/2023 with labial abscess. Pt was hypotensive in ED with response to fluids. No plans for surgical management at this time.  Vancomycin levels 10/13/ to 10/14 resulted in a AUC of 584 (supra-therapeutic)  Plan: Decrease Vancomycin to 1 gram iv Q 24 hours  F/u renal function, length of therapy and narrow as able with culture data   Height: 5\' 7"  (170.2 cm) Weight: (!) 137.4 kg (303 lb) IBW/kg (Calculated) : 61.6  Temp (24hrs), Avg:98.3 F (36.8 C), Min:97.8 F (36.6 C), Max:98.6 F (37 C)  Recent Labs  Lab 06/07/23 1544 06/07/23 1622 06/07/23 1754 06/08/23 0427 06/09/23 0523 06/10/23 0453 06/10/23 2021 06/11/23 0726 06/11/23 1735  WBC 17.7*  --   --  18.1* 19.4* 16.3*  --  12.7*  --   CREATININE 1.50*  --   --  1.25* 1.52* 1.40*  --  1.19*  --   LATICACIDVEN  --  2.2* 1.2  --   --   --   --   --   --   VANCOTROUGH  --   --   --   --   --   --   --   --  13*  VANCOPEAK  --   --   --   --   --   --  40  --   --     Estimated Creatinine Clearance: 67.5 mL/min (A) (by C-G formula based on SCr of 1.19 mg/dL (H)).    Allergies  Allergen Reactions   Amoxicillin Other (See Comments)    Dizziness, abdominal pain   Adhesive [Tape] Rash    Tegaderm   Exenatide Other (See Comments)    Flu-like symptoms   Invokana [Canagliflozin] Other (See Comments)    Yeast infection/dehydration   Jardiance [Empagliflozin] Other (See Comments)    Yeast infections and dehydration   Lisinopril Other (See Comments)    cramping   Other Diarrhea and Other (See Comments)    Kale=food  Per patient, it causes GI bleeding    Statins Other (See Comments)    Leg cramps    Thank you for allowing pharmacy to be a part of this patient's care.  Elwin Sleight 06/11/2023 7:53 PM

## 2023-06-11 NOTE — Care Management Important Message (Signed)
Important Message  Patient Details  Name: Terri Heath MRN: [id number] Date of Birth: Dec 29, 1955   Important Message Given:        Dorena Bodo 06/11/2023, 2:56 PM

## 2023-06-11 NOTE — Progress Notes (Signed)
PROGRESS NOTE  Terri Heath WUJ:[id number] DOB: 1956/04/14 DOA: 06/07/2023 PCP: Tollie Eth, NP   LOS: 4 days   Brief Narrative / Interim history: 67 year old female with history of recurrent labial abscess, DM2, morbid obesity, endometrial cancer s/p surgery/chemoradiation, chronic CHF with improved EF, CAD, PAD, MGUS, HTN, HLD who comes into the hospital with few days of drainage from the labial abscess.  She reports significant pain in that area.  This has been extremely malodorous.  She has had some fever and chills, being lightheaded at home.  In the ED she was found to be septic, received few IV boluses and OB/GYN was consulted  Subjective / 24h Interval events: Had a better night last night.  Has not had any more hallucinations since oxycodone was discontinued.  Tells me that tramadol works and her pain is better controlled  Assesement and Plan: Principal problem Sepsis due to left labial, concern for perineal abscess - spontaneously draining.  Very tender to palpation.  OB/GYN apparently consulted, appreciate input. -For now continue broad-spectrum antibiotics.  May need repeat imaging, per OB/GYN.  Awaiting input this morning -Cultures obtained from drainage shows few strep anginosus and Enterococcus faecalis.  Susceptibilities still to follow this morning  Active problems AKI-baseline creatinine 0.9, elevated to 1.5 on admission.  Suspect in the setting of sepsis.  Overall stable, this morning's value pending.  Will watch carefully, she has good p.o. intake, encouraged to stay hydrated.  If continues to increase, may need to discuss with OB/GYN regarding antibiotics, Vanc\Zosyn combo  DM2, with hyperglycemia, uncontrolled-A1c 9.3 last month.  Continue insulin, sliding scale.  Increase insulin regimen today  Lab Results  Component Value Date   HGBA1C 9.3 (H) 05/03/2023   CBG (last 3)  Recent Labs    06/10/23 2013 06/11/23 0650 06/11/23 0813  GLUCAP 227* 236* 264*    Hypothyroidism-continue Synthroid  Left basilar density on chest x-ray-likely incidental, will repeat chest x-ray in a few weeks as an outpatient  Chronic combined CHF-recently EF has improved.  Seeing heart failure surgery.  Sepsis physiology improving, gradually resume home medications with Coreg today.  Continue to hold furosemide, Entresto, spironolactone.  Blood pressure remains low normal, continue to hold home meds  CAD, PAD-continue on aspirin.  She is on Repatha as an outpatient  OSA-CPAP nightly  History of endometrial cancer status post surgery, chemoradiation-outpatient follow-up with Dr. Pricilla Holm  Obesity, morbid-BMI 47  Scheduled Meds:  aspirin  81 mg Oral Daily   carvedilol  3.125 mg Oral BID WC   insulin aspart  0-15 Units Subcutaneous TID WC   insulin aspart  0-5 Units Subcutaneous QHS   insulin glargine-yfgn  25 Units Subcutaneous Daily   levothyroxine  50 mcg Oral Q0600   silver sulfADIAZINE   Topical TID   sodium chloride flush  3 mL Intravenous Q12H   Continuous Infusions:  piperacillin-tazobactam (ZOSYN)  IV 3.375 g (06/11/23 0507)   vancomycin 1,250 mg (06/10/23 1813)   PRN Meds:.acetaminophen, albuterol, magnesium hydroxide, polyethylene glycol, traMADol  Current Outpatient Medications  Medication Instructions   acetaminophen (TYLENOL) 500-1,000 mg, Oral, Every 6 hours PRN   albuterol (PROAIR HFA) 108 (90 Base) MCG/ACT inhaler 2 puffs, Inhalation, Every 6 hours PRN, wheezing   aspirin 81 mg, Oral, Daily   blood glucose meter kit and supplies Use up to four times daily as directed. (FOR ICD-10 E10.9, E11.9).   carvedilol (COREG) 6.25 mg, Oral, 2 times daily   furosemide (LASIX) 20 mg, Oral, Daily  glucose blood test strip Use as directed to test blood sugar   Lantus SoloStar 25-40 Units, Subcutaneous, Daily   levothyroxine (SYNTHROID) 50 MCG tablet Take 1 tablet (50 mcg total) by mouth daily, before a meal.   magnesium oxide (MAG-OX) 400 mg, Oral,  Daily   metFORMIN (GLUCOPHAGE) 1,000 mg, Oral, 2 times daily with meals   Mounjaro 2.5 mg, Subcutaneous, Weekly   Multiple Vitamin (MULITIVITAMIN WITH MINERALS) TABS 1 tablet, Oral, Daily with breakfast   naproxen sodium (ALEVE) 440 mg, Oral, Daily PRN   OneTouch Delica Lancets 33G MISC use as directed   Repatha SureClick 140 mg, Subcutaneous, Every 14 days   sacubitril-valsartan (ENTRESTO) 24-26 MG 1 tablet, Oral, 2 times daily   spironolactone (ALDACTONE) 25 mg, Oral, Daily   triamcinolone ointment (KENALOG) 0.1 % Apply 1 application topically two times daily to affected area(s) as needed, sparing use to avoid whitening/thinning skin    Diet Orders (From admission, onward)     Start     Ordered   06/08/23 1035  Diet Carb Modified Fluid consistency: Thin; Room service appropriate? Yes  Diet effective now       Question Answer Comment  Diet-HS Snack? Nothing   Calorie Level Medium 1600-2000   Fluid consistency: Thin   Room service appropriate? Yes      06/08/23 1034            DVT prophylaxis: SCDs Start: 06/07/23 2228   Lab Results  Component Value Date   PLT 178 06/11/2023      Code Status: Full Code  Family Communication: no family at bedside   Status is: Inpatient Remains inpatient appropriate because: severity of illness  Level of care: Telemetry Medical  Consultants:  Gyn  Objective: Vitals:   06/10/23 2257 06/11/23 0100 06/11/23 0523 06/11/23 0816  BP:  116/70 113/67 119/67  Pulse: 82  82 86  Resp: 20 19 17 18   Temp:  98.6 F (37 C) 98.6 F (37 C) 98 F (36.7 C)  TempSrc:  Oral Oral Oral  SpO2: 98% 100% 100% 99%  Weight:      Height:       No intake or output data in the 24 hours ending 06/11/23 0909  Wt Readings from Last 3 Encounters:  06/09/23 (!) 137.4 kg  05/03/23 133.8 kg  02/06/23 133.8 kg    Examination:  Constitutional: NAD Eyes: lids and conjunctivae normal, no scleral icterus ENMT: mmm Neck: normal, supple Respiratory:  clear to auscultation bilaterally, no wheezing, no crackles. Normal respiratory effort.  Cardiovascular: Regular rate and rhythm, no murmurs / rubs / gallops. No LE edema. Abdomen: soft, no distention, no tenderness. Bowel sounds positive.   Data Reviewed: I have independently reviewed following labs and imaging studies   CBC Recent Labs  Lab 06/07/23 1544 06/08/23 0427 06/09/23 0523 06/10/23 0453 06/11/23 0726  WBC 17.7* 18.1* 19.4* 16.3* 12.7*  HGB 11.0* 9.4* 10.1* 9.5* 9.9*  HCT 34.5* 29.7* 31.0* 28.6* 30.6*  PLT 123* 175 160 224 178  MCV 91.3 88.1 88.6 87.2 86.4  MCH 29.1 27.9 28.9 29.0 28.0  MCHC 31.9 31.6 32.6 33.2 32.4  RDW 13.8 14.0 14.4 14.7 15.2  LYMPHSABS 0.8  --   --   --   --   MONOABS 1.2*  --   --   --   --   EOSABS 0.0  --   --   --   --   BASOSABS 0.1  --   --   --   --  Recent Labs  Lab 06/07/23 1544 06/07/23 1622 06/07/23 1754 06/08/23 0427 06/09/23 0523 06/10/23 0453  NA 134*  --   --  136 131* 133*  K 4.5  --   --  4.3 4.0 3.6  CL 101  --   --  105 100 102  CO2 20*  --   --  18* 18* 19*  GLUCOSE 224*  --   --  215* 198* 211*  BUN 30*  --   --  28* 32* 28*  CREATININE 1.50*  --   --  1.25* 1.52* 1.40*  CALCIUM 8.6*  --   --  8.3* 8.3* 8.4*  AST 13*  --   --   --  16 13*  ALT 10  --   --   --  15 13  ALKPHOS 105  --   --   --  102 102  BILITOT 0.6  --   --   --  0.6 0.5  ALBUMIN 2.5*  --   --   --  1.9* 1.8*  MG  --   --   --  1.8 2.0 2.1  LATICACIDVEN  --  2.2* 1.2  --   --   --   INR 1.2  --   --  1.3*  --   --     ------------------------------------------------------------------------------------------------------------------ No results for input(s): "CHOL", "HDL", "LDLCALC", "TRIG", "CHOLHDL", "LDLDIRECT" in the last 72 hours.  Lab Results  Component Value Date   HGBA1C 9.3 (H) 05/03/2023   ------------------------------------------------------------------------------------------------------------------ No results for input(s):  "TSH", "T4TOTAL", "T3FREE", "THYROIDAB" in the last 72 hours.  Invalid input(s): "FREET3"  Cardiac Enzymes No results for input(s): "CKMB", "TROPONINI", "MYOGLOBIN" in the last 168 hours.  Invalid input(s): "CK" ------------------------------------------------------------------------------------------------------------------    Component Value Date/Time   BNP 33.4 08/03/2022 1506    CBG: Recent Labs  Lab 06/10/23 1145 06/10/23 1627 06/10/23 2013 06/11/23 0650 06/11/23 0813  GLUCAP 232* 255* 227* 236* 264*    Recent Results (from the past 240 hour(s))  Blood Culture (routine x 2)     Status: None (Preliminary result)   Collection Time: 06/07/23  3:44 PM   Specimen: BLOOD  Result Value Ref Range Status   Specimen Description BLOOD RIGHT ANTECUBITAL  Final   Special Requests   Final    BOTTLES DRAWN AEROBIC AND ANAEROBIC Blood Culture results may not be optimal due to an excessive volume of blood received in culture bottles   Culture   Final    NO GROWTH 4 DAYS Performed at [facility], [address] [place], [place] [address]    Report Status PENDING  Incomplete  Body fluid culture w Gram Stain     Status: None (Preliminary result)   Collection Time: 06/08/23  1:09 AM   Specimen: Abscess; Body Fluid  Result Value Ref Range Status   Specimen Description ABSCESS  Final   Special Requests NONE  Final   Gram Stain   Final    FEW WBC PRESENT, PREDOMINANTLY MONONUCLEAR MODERATE GRAM NEGATIVE RODS FEW GRAM POSITIVE COCCI IN CLUSTERS    Culture   Final    FEW STREPTOCOCCUS ANGINOSIS FEW ENTEROCOCCUS FAECALIS SUSCEPTIBILITIES TO FOLLOW RARE GRAM POSITIVE RODS IDENTIFICATION TO FOLLOW Performed at [facility], [address] [place], [place] [address]    Report Status PENDING  Incomplete  Blood Culture (routine x 2)     Status: None (Preliminary result)   Collection Time: 06/08/23  4:24 AM  Specimen: BLOOD RIGHT HAND  Result Value Ref Range Status    Specimen Description BLOOD RIGHT HAND  Final   Special Requests   Final    BOTTLES DRAWN AEROBIC AND ANAEROBIC Blood Culture adequate volume   Culture   Final    NO GROWTH 3 DAYS Performed at [facility], [address], [place], [place] [address]    Report Status PENDING  Incomplete  MRSA Next Gen by PCR, Nasal     Status: None   Collection Time: 06/08/23  4:20 PM   Specimen: Nasal Mucosa; Nasal Swab  Result Value Ref Range Status   MRSA by PCR Next Gen NOT DETECTED NOT DETECTED Final    Comment: (NOTE) The GeneXpert MRSA Assay (FDA approved for NASAL specimens only), is one component of a comprehensive MRSA colonization surveillance program. It is not intended to diagnose MRSA infection nor to guide or monitor treatment for MRSA infections. Test performance is not FDA approved in patients less than 32 years old. Performed at [facility], [address] [place], [place] [address]      Radiology Studies: No results found.   Pamella Pert, MD, PhD [facility]  Between 7 am - 7 pm I am available, please contact me via Amion (for emergencies) or Securechat (non urgent messages)  Between 7 pm - 7 am I am not available, please contact night coverage MD/APP via Amion

## 2023-06-11 NOTE — Inpatient Diabetes Management (Signed)
Inpatient Diabetes Program Recommendations  AACE/ADA: New Consensus Statement on Inpatient Glycemic Control (2015)  Target Ranges:  Prepandial:   less than 140 mg/dL      Peak postprandial:   less than 180 mg/dL (1-2 hours)      Critically ill patients:  140 - 180 mg/dL   Lab Results  Component Value Date   GLUCAP 261 (H) 06/11/2023   HGBA1C 9.3 (H) 05/03/2023    Review of Glycemic Control  Latest Reference Range & Units 06/10/23 07:16 06/10/23 11:45 06/10/23 16:27 06/10/23 20:13 06/11/23 06:50 06/11/23 08:13 06/11/23 11:42  Glucose-Capillary 70 - 99 mg/dL 784 (H) 696 (H) 295 (H) 227 (H) 236 (H) 264 (H) 261 (H)   Diabetes history: DM 2 Outpatient Diabetes medications: Lantus 25 -40 units Daily, Metformin 1000 mg bid, Mounjaro 2.5 mg weekly Current orders for Inpatient glycemic control:  Semglee 28 units Novolog 0-15 units tid + hs Novolog 3 units tid meal coverage  Note: Pt received a total of 16 units of Novolog within a 24 hours period with glucose trends remaining in the 200 range. Note basal insulin increased by 3 units. Meal coverage and hs Novolog added.  Inpatient Diabetes Program Recommendations:    -   Increase Semglee to 25 units  Thanks,  Christena Deem RN, MSN, BC-ADM Inpatient Diabetes Coordinator Team Pager [phone] (8a-5p)

## 2023-06-11 NOTE — Progress Notes (Signed)
[facility] Practice OB/GYN Attending Progress Note  ADMISSION DIAGNOSIS:   Principal Problem:   Labial abscess   Subjective  Pt resting comfortably in bed.  Denies fever or chills.  Today she feels that she is feeling better- still notes discomfort to the area, but better than arrival.  Denies fever or chills overnight   Objective  VITALS:  height is 5\' 7"  (1.702 m) and weight is 137.4 kg (abnormal). Her oral temperature is 98.3 F (36.8 C). Her blood pressure is 116/67 and her pulse is 81. Her respiration is 18 and oxygen saturation is 99%.    EXAMINATION: CONSTITUTIONAL: Well-developed, well-nourished female in no acute distress.  HENT:  Normocephalic, atraumatic,  SKIN: Skin is warm and dry. No rash noted. Not diaphoretic. NEUROLOGIC: Alert and oriented to person, place, and time. PSYCHIATRIC: Normal mood and affect. Normal behavior. Normal judgment and thought content. CARDIOVASCULAR: Normal heart rate noted RESPIRATORY: Effort and breath sounds normal, no problems with respiration noted Abd: obese, soft and non-tender.  No erythema extending to mons GU: Left labia with considerable erythema and induration.  Exudate noted both upper portion of labia as well as near rectum.  Based on prior imaging edges appear unchanged- no evidence of worsening.  Per pt seems to be less tender MUSCULOSKELETAL: No calf tenderness bilaterally  Laboratory Reports: Results for orders placed or performed during the hospital encounter of 06/07/23 (from the past 72 hour(s))  CBG monitoring, ED     Status: Abnormal   Collection Time: 06/08/23 12:34 PM  Result Value Ref Range   Glucose-Capillary 190 (H) 70 - 99 mg/dL    Comment: Glucose reference range applies only to samples taken after fasting for at least 8 hours.  MRSA Next Gen by PCR, Nasal     Status: None   Collection Time: 06/08/23  4:20 PM   Specimen: Nasal Mucosa; Nasal Swab  Result Value Ref Range   MRSA by PCR  Next Gen NOT DETECTED NOT DETECTED    Comment: (NOTE) The GeneXpert MRSA Assay (FDA approved for NASAL specimens only), is one component of a comprehensive MRSA colonization surveillance program. It is not intended to diagnose MRSA infection nor to guide or monitor treatment for MRSA infections. Test performance is not FDA approved in patients less than 28 years old. Performed at [facility], [address] [place], [place] [address]   Glucose, capillary     Status: Abnormal   Collection Time: 06/08/23  5:24 PM  Result Value Ref Range   Glucose-Capillary 260 (H) 70 - 99 mg/dL    Comment: Glucose reference range applies only to samples taken after fasting for at least 8 hours.  Glucose, capillary     Status: Abnormal   Collection Time: 06/08/23  9:31 PM  Result Value Ref Range   Glucose-Capillary 227 (H) 70 - 99 mg/dL    Comment: Glucose reference range applies only to samples taken after fasting for at least 8 hours.  Comprehensive metabolic panel     Status: Abnormal   Collection Time: 06/09/23  5:23 AM  Result Value Ref Range   Sodium 131 (L) 135 - 145 mmol/L   Potassium 4.0 3.5 - 5.1 mmol/L   Chloride 100 98 - 111 mmol/L   CO2 18 (L) 22 - 32 mmol/L   Glucose, Bld 198 (H) 70 - 99 mg/dL    Comment: Glucose reference range applies only to samples taken after fasting  for at least 8 hours.   BUN 32 (H) 8 - 23 mg/dL   Creatinine, Ser 1.61 (H) 0.44 - 1.00 mg/dL   Calcium 8.3 (L) 8.9 - 10.3 mg/dL   Total Protein 6.4 (L) 6.5 - 8.1 g/dL   Albumin 1.9 (L) 3.5 - 5.0 g/dL   AST 16 15 - 41 U/L   ALT 15 0 - 44 U/L   Alkaline Phosphatase 102 38 - 126 U/L   Total Bilirubin 0.6 0.3 - 1.2 mg/dL   GFR, Estimated 38 (L) >60 mL/min    Comment: (NOTE) Calculated using the CKD-EPI Creatinine Equation (2021)    Anion gap 13 5 - 15    Comment: Performed at [facility], [address] [place], [place] [address]  CBC     Status: Abnormal   Collection Time: 06/09/23  5:23 AM   Result Value Ref Range   WBC 19.4 (H) 4.0 - 10.5 K/uL   RBC 3.50 (L) 3.87 - 5.11 MIL/uL   Hemoglobin 10.1 (L) 12.0 - 15.0 g/dL   HCT 54.0 (L) 98.1 - 19.1 %   MCV 88.6 80.0 - 100.0 fL   MCH 28.9 26.0 - 34.0 pg   MCHC 32.6 30.0 - 36.0 g/dL   RDW 47.8 29.5 - 62.1 %   Platelets 160 150 - 400 K/uL   nRBC 0.0 0.0 - 0.2 %    Comment: Performed at [facility], [address] [place], [place] [address]  Magnesium     Status: None   Collection Time: 06/09/23  5:23 AM  Result Value Ref Range   Magnesium 2.0 1.7 - 2.4 mg/dL    Comment: Performed at [facility], [address] [place], [place] [address]  Glucose, capillary     Status: Abnormal   Collection Time: 06/09/23  7:55 AM  Result Value Ref Range   Glucose-Capillary 173 (H) 70 - 99 mg/dL    Comment: Glucose reference range applies only to samples taken after fasting for at least 8 hours.  Glucose, capillary     Status: Abnormal   Collection Time: 06/09/23 11:32 AM  Result Value Ref Range   Glucose-Capillary 206 (H) 70 - 99 mg/dL    Comment: Glucose reference range applies only to samples taken after fasting for at least 8 hours.  Glucose, capillary     Status: Abnormal   Collection Time: 06/09/23  4:18 PM  Result Value Ref Range   Glucose-Capillary 199 (H) 70 - 99 mg/dL    Comment: Glucose reference range applies only to samples taken after fasting for at least 8 hours.  Glucose, capillary     Status: Abnormal   Collection Time: 06/09/23  8:06 PM  Result Value Ref Range   Glucose-Capillary 196 (H) 70 - 99 mg/dL    Comment: Glucose reference range applies only to samples taken after fasting for at least 8 hours.  Comprehensive metabolic panel     Status: Abnormal   Collection Time: 06/10/23  4:53 AM  Result Value Ref Range   Sodium 133 (L) 135 - 145 mmol/L   Potassium 3.6 3.5 - 5.1 mmol/L   Chloride 102 98 - 111 mmol/L   CO2 19 (L) 22 - 32 mmol/L   Glucose, Bld 211 (H) 70 - 99 mg/dL    Comment: Glucose  reference range applies only to samples taken after fasting for at least 8 hours.   BUN 28 (H) 8 - 23 mg/dL   Creatinine, Ser  1.40 (H) 0.44 - 1.00 mg/dL   Calcium 8.4 (L) 8.9 - 10.3 mg/dL   Total Protein 6.3 (L) 6.5 - 8.1 g/dL   Albumin 1.8 (L) 3.5 - 5.0 g/dL   AST 13 (L) 15 - 41 U/L   ALT 13 0 - 44 U/L   Alkaline Phosphatase 102 38 - 126 U/L   Total Bilirubin 0.5 0.3 - 1.2 mg/dL   GFR, Estimated 41 (L) >60 mL/min    Comment: (NOTE) Calculated using the CKD-EPI Creatinine Equation (2021)    Anion gap 12 5 - 15    Comment: Performed at [facility], [address], [place], [place] [address]  CBC     Status: Abnormal   Collection Time: 06/10/23  4:53 AM  Result Value Ref Range   WBC 16.3 (H) 4.0 - 10.5 K/uL   RBC 3.28 (L) 3.87 - 5.11 MIL/uL   Hemoglobin 9.5 (L) 12.0 - 15.0 g/dL   HCT 78.4 (L) 69.6 - 29.5 %   MCV 87.2 80.0 - 100.0 fL   MCH 29.0 26.0 - 34.0 pg   MCHC 33.2 30.0 - 36.0 g/dL   RDW 28.4 13.2 - 44.0 %   Platelets 224 150 - 400 K/uL   nRBC 0.0 0.0 - 0.2 %    Comment: Performed at [facility], [address], [place], [place] [address]  Magnesium     Status: None   Collection Time: 06/10/23  4:53 AM  Result Value Ref Range   Magnesium 2.1 1.7 - 2.4 mg/dL    Comment: Performed at [facility], [address], [place], [place] [address]  Glucose, capillary     Status: Abnormal   Collection Time: 06/10/23  7:16 AM  Result Value Ref Range   Glucose-Capillary 177 (H) 70 - 99 mg/dL    Comment: Glucose reference range applies only to samples taken after fasting for at least 8 hours.  Glucose, capillary     Status: Abnormal   Collection Time: 06/10/23 11:45 AM  Result Value Ref Range   Glucose-Capillary 232 (H) 70 - 99 mg/dL    Comment: Glucose reference range applies only to samples taken after fasting for at least 8 hours.  Glucose, capillary     Status: Abnormal   Collection Time: 06/10/23  4:27 PM  Result Value Ref Range   Glucose-Capillary 255  (H) 70 - 99 mg/dL    Comment: Glucose reference range applies only to samples taken after fasting for at least 8 hours.  Glucose, capillary     Status: Abnormal   Collection Time: 06/10/23  8:13 PM  Result Value Ref Range   Glucose-Capillary 227 (H) 70 - 99 mg/dL    Comment: Glucose reference range applies only to samples taken after fasting for at least 8 hours.  Vancomycin, peak     Status: None   Collection Time: 06/10/23  8:21 PM  Result Value Ref Range   Vancomycin Pk 40 30 - 40 ug/mL    Comment: Performed at [facility], [address] [place], [place] [address]  Glucose, capillary     Status: Abnormal   Collection Time: 06/11/23  6:50 AM  Result Value Ref Range   Glucose-Capillary 236 (H) 70 - 99 mg/dL    Comment: Glucose reference range applies only to samples taken after fasting for at least 8 hours.  Basic metabolic panel     Status: Abnormal   Collection Time: 06/11/23  7:26 AM  Result Value Ref Range   Sodium 133 (L) 135 - 145 mmol/L   Potassium 3.4 (L) 3.5 - 5.1 mmol/L   Chloride 103 98 - 111 mmol/L   CO2 21 (L) 22 - 32 mmol/L   Glucose, Bld 211 (H) 70 - 99 mg/dL    Comment: Glucose reference range applies only to samples taken after fasting for at least 8 hours.   BUN 23 8 - 23 mg/dL   Creatinine, Ser 1.61 (H) 0.44 - 1.00 mg/dL   Calcium 8.5 (L) 8.9 - 10.3 mg/dL   GFR, Estimated 50 (L) >60 mL/min    Comment: (NOTE) Calculated using the CKD-EPI Creatinine Equation (2021)    Anion gap 9 5 - 15    Comment: Performed at [facility], [address], [place], [place] [address]  Magnesium     Status: None   Collection Time: 06/11/23  7:26 AM  Result Value Ref Range   Magnesium 2.1 1.7 - 2.4 mg/dL    Comment: Performed at [facility], [address], [place], [place] [address]  CBC     Status: Abnormal   Collection Time: 06/11/23  7:26 AM  Result Value Ref Range   WBC 12.7 (H) 4.0 - 10.5 K/uL   RBC 3.54 (L) 3.87 - 5.11 MIL/uL   Hemoglobin 9.9  (L) 12.0 - 15.0 g/dL   HCT 11.9 (L) 14.7 - 82.9 %   MCV 86.4 80.0 - 100.0 fL   MCH 28.0 26.0 - 34.0 pg   MCHC 32.4 30.0 - 36.0 g/dL   RDW 56.2 13.0 - 86.5 %   Platelets 178 150 - 400 K/uL   nRBC 0.0 0.0 - 0.2 %    Comment: Performed at [facility], [address], [place], [place] [address]  Glucose, capillary     Status: Abnormal   Collection Time: 06/11/23  8:13 AM  Result Value Ref Range   Glucose-Capillary 264 (H) 70 - 99 mg/dL    Comment: Glucose reference range applies only to samples taken after fasting for at least 8 hours.  Glucose, capillary     Status: Abnormal   Collection Time: 06/11/23 11:42 AM  Result Value Ref Range   Glucose-Capillary 261 (H) 70 - 99 mg/dL    Comment: Glucose reference range applies only to samples taken after fasting for at least 8 hours.     Assessment/Plan: 1) Vulvar abscess -Currently on IV zosyn/vanc and silvadene -Cultures pending -Spoke with IR this am- would only consider repeat imaging/IR drainage if lack of clinical improvement.  Will also continue to watch for evidence of liquefaction to reconsider either IR drainge or I&D.  Currently seems to have some improvement and drop in WBC noted.   -will continue to follow at this time  Appreciate care of Barrie Dunker by her primary team specifically management of her hyperglycemia as a contributing factor  Please call [phone] [facility] Consult Attending Monday-Friday 8am - 5pm) or [phone] [facility] OB/GYN Attending On Call all day, every day) for any gynecologic concerns at any time.  Thank you for involving [place] in the care of this patient.  DVT Prophylaxis:  Ambulation, SCD, Lovenox   Code Status: Full Disposition Plan: Continue with plan as outlined above  Myna Hidalgo, DO Attending Obstetrician & Gynecologist, [facility] for [employer], [facility]

## 2023-06-12 ENCOUNTER — Inpatient Hospital Stay (HOSPITAL_COMMUNITY): Payer: Medicare HMO

## 2023-06-12 DIAGNOSIS — N764 Abscess of vulva: Secondary | ICD-10-CM | POA: Diagnosis not present

## 2023-06-12 LAB — CBC
HCT: 30.3 % — ABNORMAL LOW (ref 36.0–46.0)
Hemoglobin: 9.9 g/dL — ABNORMAL LOW (ref 12.0–15.0)
MCH: 28.1 pg (ref 26.0–34.0)
MCHC: 32.7 g/dL (ref 30.0–36.0)
MCV: 86.1 fL (ref 80.0–100.0)
Platelets: 214 10*3/uL (ref 150–400)
RBC: 3.52 MIL/uL — ABNORMAL LOW (ref 3.87–5.11)
RDW: 15.5 % (ref 11.5–15.5)
WBC: 11.7 10*3/uL — ABNORMAL HIGH (ref 4.0–10.5)
nRBC: 0 % (ref 0.0–0.2)

## 2023-06-12 LAB — BODY FLUID CULTURE W GRAM STAIN

## 2023-06-12 LAB — BASIC METABOLIC PANEL
Anion gap: 10 (ref 5–15)
BUN: 15 mg/dL (ref 8–23)
CO2: 21 mmol/L — ABNORMAL LOW (ref 22–32)
Calcium: 8.7 mg/dL — ABNORMAL LOW (ref 8.9–10.3)
Chloride: 105 mmol/L (ref 98–111)
Creatinine, Ser: 1.1 mg/dL — ABNORMAL HIGH (ref 0.44–1.00)
GFR, Estimated: 55 mL/min — ABNORMAL LOW (ref >60)
Glucose, Bld: 279 mg/dL — ABNORMAL HIGH (ref 70–99)
Potassium: 4.2 mmol/L (ref 3.5–5.1)
Sodium: 136 mmol/L (ref 135–145)

## 2023-06-12 LAB — GLUCOSE, CAPILLARY
Glucose-Capillary: 260 mg/dL — ABNORMAL HIGH (ref 70–99)
Glucose-Capillary: 278 mg/dL — ABNORMAL HIGH (ref 70–99)
Glucose-Capillary: 121 mg/dL — ABNORMAL HIGH (ref 70–99)
Glucose-Capillary: 143 mg/dL — ABNORMAL HIGH (ref 70–99)

## 2023-06-12 LAB — CULTURE, BLOOD (ROUTINE X 2): Culture: NO GROWTH

## 2023-06-12 MED ORDER — FUROSEMIDE 20 MG PO TABS
20.0000 mg | ORAL_TABLET | Freq: Every day | ORAL | Status: DC
  Administered 2023-06-12 – 2023-06-14 (×3): 20 mg via ORAL
  Filled 2023-06-12 (×3): qty 1

## 2023-06-12 MED ORDER — POTASSIUM CHLORIDE CRYS ER 20 MEQ PO TBCR
30.0000 meq | EXTENDED_RELEASE_TABLET | Freq: Once | ORAL | Status: AC
  Administered 2023-06-12: 30 meq via ORAL
  Filled 2023-06-12: qty 1

## 2023-06-12 MED ORDER — MAGNESIUM HYDROXIDE 400 MG/5ML PO SUSP: 30.0000 mL | Freq: Every day | ORAL | Status: DC | PRN

## 2023-06-12 MED ORDER — IOHEXOL 350 MG/ML SOLN
75.0000 mL | Freq: Once | INTRAVENOUS | Status: AC | PRN
  Administered 2023-06-12: 75 mL via INTRAVENOUS

## 2023-06-12 MED ORDER — INSULIN GLARGINE-YFGN 100 UNIT/ML ~~LOC~~ SOLN
30.0000 [IU] | Freq: Every day | SUBCUTANEOUS | Status: DC
  Administered 2023-06-13 – 2023-06-14 (×2): 30 [IU] via SUBCUTANEOUS
  Filled 2023-06-12 (×2): qty 0.3

## 2023-06-12 MED ORDER — LIDOCAINE 4 % EX CREA
TOPICAL_CREAM | Freq: Three times a day (TID) | CUTANEOUS | Status: DC | PRN
  Administered 2023-06-12 – 2023-06-13 (×2): 1 via TOPICAL
  Filled 2023-06-12 (×2): qty 5

## 2023-06-12 NOTE — Care Management Important Message (Signed)
Important Message  Patient Details  Name: Terri Heath MRN: [id number] Date of Birth: 1956/03/29   Important Message Given:  Yes - Medicare IM     Dorena Bodo 06/12/2023, 2:32 PM

## 2023-06-12 NOTE — Progress Notes (Signed)
[facility] Faculty Practice OB/GYN Attending Progress Note  ADMISSION DIAGNOSIS:   Principal Problem:   Labial abscess Active Problems:   Cellulitis of perineum   Hyperglycemia due to diabetes mellitus (HCC)   Subjective  Today she notes that she is starting to feel better.  Still having considerable pain in the area especially with movement, but feels like she is starting to move more than previously.  No fever/chills.  She has started to notice more drainage from the area.  No other acute complaints.   Objective  VITALS:  height is 5\' 7"  (1.702 m) and weight is 136.6 kg (abnormal). Her oral temperature is 97.7 F (36.5 C). Her blood pressure is 135/63 and her pulse is 72. Her respiration is 18 and oxygen saturation is 96%.   EXAMINATION: CONSTITUTIONAL: Well-developed, well-nourished female in no acute distress.  NECK: Normal range of motion, supple, no masses SKIN: Skin is warm and dry. Not diaphorectic NEUROLOGIC: Alert and oriented to person, place, and time. PSYCHIATRIC: Normal mood and affect. Normal behavior. Normal judgment and thought content. CARDIOVASCULAR: Normal heart rate noted RESPIRATORY: Effort and breath sounds normal, no problems with respiration noted MUSCULOSKELETAL: No edema and no calf tenderness bilaterally ABDOMEN: obese, soft and non-tender GU:  Left labia with improvement of erythema and edema.  Spontaneous draining at 2 spot- one near buttock and on lower lateral portion of labia.  Able to express some purulent fluid- appropriately tender.  Induration still noted, but starting to improve.  Significant improvement noted.  Laboratory Reports: Results for orders placed or performed during the hospital encounter of 06/07/23 (from the past 72 hour(s))  Glucose, capillary     Status: Abnormal   Collection Time: 06/09/23 11:32 AM  Result Value Ref Range   Glucose-Capillary 206 (H) 70 - 99 mg/dL    Comment: Glucose reference range applies only  to samples taken after fasting for at least 8 hours.  Glucose, capillary     Status: Abnormal   Collection Time: 06/09/23  4:18 PM  Result Value Ref Range   Glucose-Capillary 199 (H) 70 - 99 mg/dL    Comment: Glucose reference range applies only to samples taken after fasting for at least 8 hours.  Glucose, capillary     Status: Abnormal   Collection Time: 06/09/23  8:06 PM  Result Value Ref Range   Glucose-Capillary 196 (H) 70 - 99 mg/dL    Comment: Glucose reference range applies only to samples taken after fasting for at least 8 hours.  Comprehensive metabolic panel     Status: Abnormal   Collection Time: 06/10/23  4:53 AM  Result Value Ref Range   Sodium 133 (L) 135 - 145 mmol/L   Potassium 3.6 3.5 - 5.1 mmol/L   Chloride 102 98 - 111 mmol/L   CO2 19 (L) 22 - 32 mmol/L   Glucose, Bld 211 (H) 70 - 99 mg/dL    Comment: Glucose reference range applies only to samples taken after fasting for at least 8 hours.   BUN 28 (H) 8 - 23 mg/dL   Creatinine, Ser 8.29 (H) 0.44 - 1.00 mg/dL   Calcium 8.4 (L) 8.9 - 10.3 mg/dL   Total Protein 6.3 (L) 6.5 - 8.1 g/dL   Albumin 1.8 (L) 3.5 - 5.0 g/dL   AST 13 (L) 15 - 41 U/L   ALT 13 0 - 44 U/L   Alkaline Phosphatase 102 38 - 126 U/L   Total  Bilirubin 0.5 0.3 - 1.2 mg/dL   GFR, Estimated 41 (L) >60 mL/min    Comment: (NOTE) Calculated using the CKD-EPI Creatinine Equation (2021)    Anion gap 12 5 - 15    Comment: Performed at [facility], [address] [place], [place] [address]  CBC     Status: Abnormal   Collection Time: 06/10/23  4:53 AM  Result Value Ref Range   WBC 16.3 (H) 4.0 - 10.5 K/uL   RBC 3.28 (L) 3.87 - 5.11 MIL/uL   Hemoglobin 9.5 (L) 12.0 - 15.0 g/dL   HCT 53.6 (L) 64.4 - 03.4 %   MCV 87.2 80.0 - 100.0 fL   MCH 29.0 26.0 - 34.0 pg   MCHC 33.2 30.0 - 36.0 g/dL   RDW 74.2 59.5 - 63.8 %   Platelets 224 150 - 400 K/uL   nRBC 0.0 0.0 - 0.2 %    Comment: Performed at [facility], [address],  [place], [place] [address]  Magnesium     Status: None   Collection Time: 06/10/23  4:53 AM  Result Value Ref Range   Magnesium 2.1 1.7 - 2.4 mg/dL    Comment: Performed at [facility], [address], [place], [place] [address]  Glucose, capillary     Status: Abnormal   Collection Time: 06/10/23  7:16 AM  Result Value Ref Range   Glucose-Capillary 177 (H) 70 - 99 mg/dL    Comment: Glucose reference range applies only to samples taken after fasting for at least 8 hours.  Glucose, capillary     Status: Abnormal   Collection Time: 06/10/23 11:45 AM  Result Value Ref Range   Glucose-Capillary 232 (H) 70 - 99 mg/dL    Comment: Glucose reference range applies only to samples taken after fasting for at least 8 hours.  Glucose, capillary     Status: Abnormal   Collection Time: 06/10/23  4:27 PM  Result Value Ref Range   Glucose-Capillary 255 (H) 70 - 99 mg/dL    Comment: Glucose reference range applies only to samples taken after fasting for at least 8 hours.  Glucose, capillary     Status: Abnormal   Collection Time: 06/10/23  8:13 PM  Result Value Ref Range   Glucose-Capillary 227 (H) 70 - 99 mg/dL    Comment: Glucose reference range applies only to samples taken after fasting for at least 8 hours.  Vancomycin, peak     Status: None   Collection Time: 06/10/23  8:21 PM  Result Value Ref Range   Vancomycin Pk 40 30 - 40 ug/mL    Comment: Performed at [facility], [address], [place], [place] [address]  Glucose, capillary     Status: Abnormal   Collection Time: 06/11/23  6:50 AM  Result Value Ref Range   Glucose-Capillary 236 (H) 70 - 99 mg/dL    Comment: Glucose reference range applies only to samples taken after fasting for at least 8 hours.  Basic metabolic panel     Status: Abnormal   Collection Time: 06/11/23  7:26 AM  Result Value Ref Range   Sodium 133 (L) 135 - 145 mmol/L   Potassium 3.4 (L) 3.5 - 5.1 mmol/L   Chloride 103 98 - 111 mmol/L   CO2 21 (L) 22 - 32  mmol/L   Glucose, Bld 211 (H) 70 - 99 mg/dL    Comment: Glucose reference range applies only to samples taken after  fasting for at least 8 hours.   BUN 23 8 - 23 mg/dL   Creatinine, Ser 1.61 (H) 0.44 - 1.00 mg/dL   Calcium 8.5 (L) 8.9 - 10.3 mg/dL   GFR, Estimated 50 (L) >60 mL/min    Comment: (NOTE) Calculated using the CKD-EPI Creatinine Equation (2021)    Anion gap 9 5 - 15    Comment: Performed at [facility], [address] [place], [place] [address]  Magnesium     Status: None   Collection Time: 06/11/23  7:26 AM  Result Value Ref Range   Magnesium 2.1 1.7 - 2.4 mg/dL    Comment: Performed at [facility], [address], [place], [place] [address]  CBC     Status: Abnormal   Collection Time: 06/11/23  7:26 AM  Result Value Ref Range   WBC 12.7 (H) 4.0 - 10.5 K/uL   RBC 3.54 (L) 3.87 - 5.11 MIL/uL   Hemoglobin 9.9 (L) 12.0 - 15.0 g/dL   HCT 11.9 (L) 14.7 - 82.9 %   MCV 86.4 80.0 - 100.0 fL   MCH 28.0 26.0 - 34.0 pg   MCHC 32.4 30.0 - 36.0 g/dL   RDW 56.2 13.0 - 86.5 %   Platelets 178 150 - 400 K/uL   nRBC 0.0 0.0 - 0.2 %    Comment: Performed at [facility], [address], [place], [place] [address]  Glucose, capillary     Status: Abnormal   Collection Time: 06/11/23  8:13 AM  Result Value Ref Range   Glucose-Capillary 264 (H) 70 - 99 mg/dL    Comment: Glucose reference range applies only to samples taken after fasting for at least 8 hours.  Glucose, capillary     Status: Abnormal   Collection Time: 06/11/23 11:42 AM  Result Value Ref Range   Glucose-Capillary 261 (H) 70 - 99 mg/dL    Comment: Glucose reference range applies only to samples taken after fasting for at least 8 hours.  Glucose, capillary     Status: Abnormal   Collection Time: 06/11/23  3:49 PM  Result Value Ref Range   Glucose-Capillary 162 (H) 70 - 99 mg/dL    Comment: Glucose reference range applies only to samples taken after fasting for at least 8 hours.  Vancomycin, trough      Status: Abnormal   Collection Time: 06/11/23  5:35 PM  Result Value Ref Range   Vancomycin Tr 13 (L) 15 - 20 ug/mL    Comment: Performed at [facility], [address], [place], [place] [address]  Glucose, capillary     Status: Abnormal   Collection Time: 06/11/23  8:59 PM  Result Value Ref Range   Glucose-Capillary 150 (H) 70 - 99 mg/dL    Comment: Glucose reference range applies only to samples taken after fasting for at least 8 hours.  Glucose, capillary     Status: Abnormal   Collection Time: 06/12/23  8:25 AM  Result Value Ref Range   Glucose-Capillary 260 (H) 70 - 99 mg/dL    Comment: Glucose reference range applies only to samples taken after fasting for at least 8 hours.      ASSESSMENT/PLAN: Vulvar abscess -significant improvement noted, liquefaction has started to occur -CT ordered to see if drainage may be indicated, further management pending results -continue IV antibiotics  Appreciated primary care management regarding other co-morbidities   DVT Prophylaxis:  encourage ambulation, Lovenox daily Code Status: Full Disposition Plan:  Continue IV antibiotics therapy  Myna Hidalgo, DO Attending Obstetrician & Gynecologist, [facility] for [employer], [facility]

## 2023-06-12 NOTE — Progress Notes (Signed)
PROGRESS NOTE  Terri Heath:[id number] DOB: Jan 18, 1956 DOA: 06/07/2023 PCP: Tollie Eth, NP   LOS: 5 days   Brief Narrative / Interim history: 67 year old female with history of recurrent labial abscess, DM2, morbid obesity, endometrial cancer s/p surgery/chemoradiation, chronic CHF with improved EF, CAD, PAD, MGUS, HTN, HLD who comes into the hospital with few days of drainage from the labial abscess.  She reports significant pain in that area.  This has been extremely malodorous.  She has had some fever and chills, being lightheaded at home.  In the ED she was found to be septic, received few IV boluses and OB/GYN was consulted  Subjective / 24h Interval events: Doing well this morning.  No fever or chills.  Pain controlled  Assesement and Plan: Principal problem Sepsis due to left labial, concern for perineal abscess - spontaneously draining.  Very tender to palpation.  OB/GYN apparently consulted, appreciate input. -For now continue broad-spectrum antibiotics.  May need repeat imaging, per OB/GYN.  -Cultures obtained from drainage shows few strep anginosus and Enterococcus faecalis, pansensitive.  Antibiotics per OB/GYN  Active problems AKI-baseline creatinine 0.9, elevated to 1.5 on admission.  Suspect in the setting of sepsis.  Overall stable  Hypokalemia-monitor and replace as indicated  DM2, with hyperglycemia, uncontrolled-A1c 9.3 last month.  Continue insulin, sliding scale.  Fasting CBG this morning elevated, increase basal.  Postprandial CBGs looked better yesterday  Lab Results  Component Value Date   HGBA1C 9.3 (H) 05/03/2023   CBG (last 3)  Recent Labs    06/11/23 1549 06/11/23 2059 06/12/23 0825  GLUCAP 162* 150* 260*   Hypothyroidism-continue Synthroid  Left basilar density on chest x-ray-likely incidental, will repeat chest x-ray in a few weeks as an outpatient  Chronic combined CHF-recently EF has improved.  Seeing heart failure surgery.  Sepsis  physiology improved, and she was placed on Coreg.  Tolerating it well, feels more swollen, resume home Lasix today  CAD, PAD-continue on aspirin.  She is on Repatha as an outpatient  OSA-CPAP nightly  History of endometrial cancer status post surgery, chemoradiation-outpatient follow-up with Dr. Pricilla Holm  Obesity, morbid-BMI 47  Scheduled Meds:  aspirin  81 mg Oral Daily   carvedilol  3.125 mg Oral BID WC   enoxaparin (LOVENOX) injection  40 mg Subcutaneous Q24H   insulin aspart  0-15 Units Subcutaneous TID WC   insulin aspart  0-5 Units Subcutaneous QHS   insulin aspart  3 Units Subcutaneous TID WC   insulin glargine-yfgn  28 Units Subcutaneous Daily   levothyroxine  50 mcg Oral Q0600   silver sulfADIAZINE   Topical TID   sodium chloride flush  3 mL Intravenous Q12H   Continuous Infusions:  piperacillin-tazobactam (ZOSYN)  IV 3.375 g (06/12/23 0626)   vancomycin     PRN Meds:.acetaminophen, albuterol, lidocaine, magnesium hydroxide, polyethylene glycol, traMADol  Current Outpatient Medications  Medication Instructions   acetaminophen (TYLENOL) 500-1,000 mg, Oral, Every 6 hours PRN   albuterol (PROAIR HFA) 108 (90 Base) MCG/ACT inhaler 2 puffs, Inhalation, Every 6 hours PRN, wheezing   aspirin 81 mg, Oral, Daily   blood glucose meter kit and supplies Use up to four times daily as directed. (FOR ICD-10 E10.9, E11.9).   carvedilol (COREG) 6.25 mg, Oral, 2 times daily   furosemide (LASIX) 20 mg, Oral, Daily   glucose blood test strip Use as directed to test blood sugar   Lantus SoloStar 25-40 Units, Subcutaneous, Daily   levothyroxine (SYNTHROID) 50 MCG tablet Take 1 tablet (  50 mcg total) by mouth daily, before a meal.   magnesium oxide (MAG-OX) 400 mg, Oral, Daily   metFORMIN (GLUCOPHAGE) 1,000 mg, Oral, 2 times daily with meals   Mounjaro 2.5 mg, Subcutaneous, Weekly   Multiple Vitamin (MULITIVITAMIN WITH MINERALS) TABS 1 tablet, Oral, Daily with breakfast   naproxen sodium  (ALEVE) 440 mg, Oral, Daily PRN   OneTouch Delica Lancets 33G MISC use as directed   Repatha SureClick 140 mg, Subcutaneous, Every 14 days   sacubitril-valsartan (ENTRESTO) 24-26 MG 1 tablet, Oral, 2 times daily   spironolactone (ALDACTONE) 25 mg, Oral, Daily   triamcinolone ointment (KENALOG) 0.1 % Apply 1 application topically two times daily to affected area(s) as needed, sparing use to avoid whitening/thinning skin    Diet Orders (From admission, onward)     Start     Ordered   06/08/23 1035  Diet Carb Modified Fluid consistency: Thin; Room service appropriate? Yes  Diet effective now       Question Answer Comment  Diet-HS Snack? Nothing   Calorie Level Medium 1600-2000   Fluid consistency: Thin   Room service appropriate? Yes      06/08/23 1034            DVT prophylaxis: enoxaparin (LOVENOX) injection 40 mg Start: 06/11/23 1400 SCDs Start: 06/07/23 2228   Lab Results  Component Value Date   PLT 178 06/11/2023      Code Status: Full Code  Family Communication: no family at bedside   Status is: Inpatient Remains inpatient appropriate because: severity of illness  Level of care: Telemetry Medical  Consultants:  Gyn  Objective: Vitals:   06/12/23 0418 06/12/23 0623 06/12/23 0825 06/12/23 0839  BP: 108/88  (!) 166/86 135/63  Pulse: 89  99 72  Resp:      Temp: 97.7 F (36.5 C)     TempSrc: Oral     SpO2: 98%  99% 96%  Weight:  (!) 136.6 kg    Height:       No intake or output data in the 24 hours ending 06/12/23 0914  Wt Readings from Last 3 Encounters:  06/12/23 (!) 136.6 kg  05/03/23 133.8 kg  02/06/23 133.8 kg    Examination:  Constitutional: NAD Eyes: lids and conjunctivae normal, no scleral icterus ENMT: mmm Neck: normal, supple Respiratory: clear to auscultation bilaterally, no wheezing, no crackles. Normal respiratory effort.  Cardiovascular: Regular rate and rhythm, no murmurs / rubs / gallops. 1+ LE edema. Abdomen: soft, no  distention, no tenderness. Bowel sounds positive.    Data Reviewed: I have independently reviewed following labs and imaging studies   CBC Recent Labs  Lab 06/07/23 1544 06/08/23 0427 06/09/23 0523 06/10/23 0453 06/11/23 0726  WBC 17.7* 18.1* 19.4* 16.3* 12.7*  HGB 11.0* 9.4* 10.1* 9.5* 9.9*  HCT 34.5* 29.7* 31.0* 28.6* 30.6*  PLT 123* 175 160 224 178  MCV 91.3 88.1 88.6 87.2 86.4  MCH 29.1 27.9 28.9 29.0 28.0  MCHC 31.9 31.6 32.6 33.2 32.4  RDW 13.8 14.0 14.4 14.7 15.2  LYMPHSABS 0.8  --   --   --   --   MONOABS 1.2*  --   --   --   --   EOSABS 0.0  --   --   --   --   BASOSABS 0.1  --   --   --   --     Recent Labs  Lab 06/07/23 1544 06/07/23 1622 06/07/23 1754 06/08/23 0427 06/09/23 0523 06/10/23  1914 06/11/23 0726  NA 134*  --   --  136 131* 133* 133*  K 4.5  --   --  4.3 4.0 3.6 3.4*  CL 101  --   --  105 100 102 103  CO2 20*  --   --  18* 18* 19* 21*  GLUCOSE 224*  --   --  215* 198* 211* 211*  BUN 30*  --   --  28* 32* 28* 23  CREATININE 1.50*  --   --  1.25* 1.52* 1.40* 1.19*  CALCIUM 8.6*  --   --  8.3* 8.3* 8.4* 8.5*  AST 13*  --   --   --  16 13*  --   ALT 10  --   --   --  15 13  --   ALKPHOS 105  --   --   --  102 102  --   BILITOT 0.6  --   --   --  0.6 0.5  --   ALBUMIN 2.5*  --   --   --  1.9* 1.8*  --   MG  --   --   --  1.8 2.0 2.1 2.1  LATICACIDVEN  --  2.2* 1.2  --   --   --   --   INR 1.2  --   --  1.3*  --   --   --     ------------------------------------------------------------------------------------------------------------------ No results for input(s): "CHOL", "HDL", "LDLCALC", "TRIG", "CHOLHDL", "LDLDIRECT" in the last 72 hours.  Lab Results  Component Value Date   HGBA1C 9.3 (H) 05/03/2023   ------------------------------------------------------------------------------------------------------------------ No results for input(s): "TSH", "T4TOTAL", "T3FREE", "THYROIDAB" in the last 72 hours.  Invalid input(s):  "FREET3"  Cardiac Enzymes No results for input(s): "CKMB", "TROPONINI", "MYOGLOBIN" in the last 168 hours.  Invalid input(s): "CK" ------------------------------------------------------------------------------------------------------------------    Component Value Date/Time   BNP 33.4 08/03/2022 1506    CBG: Recent Labs  Lab 06/11/23 0813 06/11/23 1142 06/11/23 1549 06/11/23 2059 06/12/23 0825  GLUCAP 264* 261* 162* 150* 260*    Recent Results (from the past 240 hour(s))  Blood Culture (routine x 2)     Status: None   Collection Time: 06/07/23  3:44 PM   Specimen: BLOOD  Result Value Ref Range Status   Specimen Description BLOOD RIGHT ANTECUBITAL  Final   Special Requests   Final    BOTTLES DRAWN AEROBIC AND ANAEROBIC Blood Culture results may not be optimal due to an excessive volume of blood received in culture bottles   Culture   Final    NO GROWTH 5 DAYS Performed at [facility], [address] [place], [place] [address]    Report Status 06/12/2023 FINAL  Final  Body fluid culture w Gram Stain     Status: None (Preliminary result)   Collection Time: 06/08/23  1:09 AM   Specimen: Abscess; Body Fluid  Result Value Ref Range Status   Specimen Description ABSCESS  Final   Special Requests NONE  Final   Gram Stain   Final    FEW WBC PRESENT, PREDOMINANTLY MONONUCLEAR MODERATE GRAM NEGATIVE RODS FEW GRAM POSITIVE COCCI IN CLUSTERS    Culture   Final    FEW STREPTOCOCCUS ANGINOSIS FEW ENTEROCOCCUS FAECALIS RARE CORYNEBACTERIUM AMYCOLATUM Standardized susceptibility testing for this organism is not available. CULTURE REINCUBATED FOR BETTER GROWTH Performed at [facility], [address], [place], [place] [address]    Report Status PENDING  Incomplete  Organism ID, Bacteria ENTEROCOCCUS FAECALIS  Final      Susceptibility   Enterococcus faecalis - MIC*    AMPICILLIN <=2 SENSITIVE Sensitive     VANCOMYCIN 1 SENSITIVE Sensitive     GENTAMICIN  SYNERGY SENSITIVE Sensitive     * FEW ENTEROCOCCUS FAECALIS  Blood Culture (routine x 2)     Status: None (Preliminary result)   Collection Time: 06/08/23  4:24 AM   Specimen: BLOOD RIGHT HAND  Result Value Ref Range Status   Specimen Description BLOOD RIGHT HAND  Final   Special Requests   Final    BOTTLES DRAWN AEROBIC AND ANAEROBIC Blood Culture adequate volume   Culture   Final    NO GROWTH 4 DAYS Performed at [facility], [address], [place], [place] [address]    Report Status PENDING  Incomplete  MRSA Next Gen by PCR, Nasal     Status: None   Collection Time: 06/08/23  4:20 PM   Specimen: Nasal Mucosa; Nasal Swab  Result Value Ref Range Status   MRSA by PCR Next Gen NOT DETECTED NOT DETECTED Final    Comment: (NOTE) The GeneXpert MRSA Assay (FDA approved for NASAL specimens only), is one component of a comprehensive MRSA colonization surveillance program. It is not intended to diagnose MRSA infection nor to guide or monitor treatment for MRSA infections. Test performance is not FDA approved in patients less than 67 years old. Performed at [facility], [address] [place], [place] [address]      Radiology Studies: No results found.   Pamella Pert, MD, PhD [facility]  Between 7 am - 7 pm I am available, please contact me via Amion (for emergencies) or Securechat (non urgent messages)  Between 7 pm - 7 am I am not available, please contact night coverage MD/APP via Amion

## 2023-06-12 NOTE — Plan of Care (Signed)
  Problem: Fluid Volume: Goal: Ability to maintain a balanced intake and output will improve Outcome: Progressing   Problem: Health Behavior/Discharge Planning: Goal: Ability to identify and utilize available resources and services will improve Outcome: Progressing   Problem: Health Behavior/Discharge Planning: Goal: Ability to manage health-related needs will improve Outcome: Progressing

## 2023-06-13 ENCOUNTER — Other Ambulatory Visit (HOSPITAL_COMMUNITY): Payer: Self-pay

## 2023-06-13 DIAGNOSIS — N764 Abscess of vulva: Secondary | ICD-10-CM | POA: Diagnosis not present

## 2023-06-13 LAB — GLUCOSE, CAPILLARY
Glucose-Capillary: 234 mg/dL — ABNORMAL HIGH (ref 70–99)
Glucose-Capillary: 190 mg/dL — ABNORMAL HIGH (ref 70–99)
Glucose-Capillary: 127 mg/dL — ABNORMAL HIGH (ref 70–99)
Glucose-Capillary: 147 mg/dL — ABNORMAL HIGH (ref 70–99)

## 2023-06-13 LAB — CULTURE, BLOOD (ROUTINE X 2)
Culture: NO GROWTH
Special Requests: ADEQUATE

## 2023-06-13 LAB — CBC
HCT: 31.5 % — ABNORMAL LOW (ref 36.0–46.0)
Hemoglobin: 10.2 g/dL — ABNORMAL LOW (ref 12.0–15.0)
MCH: 28.5 pg (ref 26.0–34.0)
MCHC: 32.4 g/dL (ref 30.0–36.0)
MCV: 88 fL (ref 80.0–100.0)
Platelets: 335 10*3/uL (ref 150–400)
RBC: 3.58 MIL/uL — ABNORMAL LOW (ref 3.87–5.11)
RDW: 15.4 % (ref 11.5–15.5)
WBC: 10.3 10*3/uL (ref 4.0–10.5)
nRBC: 0 % (ref 0.0–0.2)

## 2023-06-13 LAB — COMPREHENSIVE METABOLIC PANEL
ALT: 21 U/L (ref 0–44)
AST: 24 U/L (ref 15–41)
Albumin: 1.9 g/dL — ABNORMAL LOW (ref 3.5–5.0)
Alkaline Phosphatase: 101 U/L (ref 38–126)
Anion gap: 12 (ref 5–15)
BUN: 13 mg/dL (ref 8–23)
CO2: 23 mmol/L (ref 22–32)
Calcium: 9 mg/dL (ref 8.9–10.3)
Chloride: 100 mmol/L (ref 98–111)
Creatinine, Ser: 1.08 mg/dL — ABNORMAL HIGH (ref 0.44–1.00)
GFR, Estimated: 57 mL/min — ABNORMAL LOW (ref >60)
Glucose, Bld: 158 mg/dL — ABNORMAL HIGH (ref 70–99)
Potassium: 4.3 mmol/L (ref 3.5–5.1)
Sodium: 135 mmol/L (ref 135–145)
Total Bilirubin: 0.5 mg/dL (ref 0.3–1.2)
Total Protein: 6.6 g/dL (ref 6.5–8.1)

## 2023-06-13 LAB — MAGNESIUM: Magnesium: 1.9 mg/dL (ref 1.7–2.4)

## 2023-06-13 MED ORDER — LINEZOLID 600 MG PO TABS
600.0000 mg | ORAL_TABLET | Freq: Two times a day (BID) | ORAL | Status: DC
  Administered 2023-06-13 – 2023-06-14 (×2): 600 mg via ORAL
  Filled 2023-06-13 (×2): qty 1

## 2023-06-13 MED ORDER — SPIRONOLACTONE 25 MG PO TABS
25.0000 mg | ORAL_TABLET | Freq: Every day | ORAL | Status: DC
  Administered 2023-06-13 – 2023-06-14 (×2): 25 mg via ORAL
  Filled 2023-06-13 (×2): qty 1

## 2023-06-13 MED ORDER — AMOXICILLIN-POT CLAVULANATE 875-125 MG PO TABS
1.0000 | ORAL_TABLET | Freq: Two times a day (BID) | ORAL | Status: DC
  Administered 2023-06-13 – 2023-06-14 (×3): 1 via ORAL
  Filled 2023-06-13 (×3): qty 1

## 2023-06-13 MED ORDER — SACUBITRIL-VALSARTAN 24-26 MG PO TABS
1.0000 | ORAL_TABLET | Freq: Two times a day (BID) | ORAL | Status: DC
  Administered 2023-06-13 – 2023-06-14 (×3): 1 via ORAL
  Filled 2023-06-13 (×4): qty 1

## 2023-06-13 MED ORDER — CARVEDILOL 6.25 MG PO TABS
6.2500 mg | ORAL_TABLET | Freq: Two times a day (BID) | ORAL | Status: DC
  Administered 2023-06-13 – 2023-06-14 (×2): 6.25 mg via ORAL
  Filled 2023-06-13 (×2): qty 1

## 2023-06-13 NOTE — TOC Benefit Eligibility Note (Signed)
Patient Product/process development scientist completed.    The patient is insured through [employer]. Patient has Medicare and is not eligible for a copay card, but may be able to apply for patient assistance, if available.    Ran test claim for linezolid (Zyvox) 600 mg and the current 7 day co-pay is $0.00.   This test claim was processed through [facility]- copay amounts may vary at other pharmacies due to pharmacy/plan contracts, or as the patient moves through the different stages of their insurance plan.     Terri Heath, CPHT Pharmacy Technician III Certified Patient Advocate [facility] Patient Advocate Team Direct Number: [phone]  Fax: [phone]

## 2023-06-13 NOTE — Progress Notes (Signed)
PROGRESS NOTE  Barrie Dunker  DOB: October 29, 1955  PCP: Tollie Eth, NP WUJ:[id number]  DOA: 06/07/2023  LOS: 6 days  Hospital Day: 7  Brief narrative: Terri Heath is a 67 y.o. female with PMH significant for morbid obesity, DM2, HTN, HLD, chronic CHF with improved EF, CAD, PAD, MGUS recurrent labial abscess, endometrial cancer s/p surgery/chemoradiation 10/10, patient presented to the ED with complaint of few days of painful wound and malodorous drainage from the labial abscess.   In the ED she was found to be septic, received few IV boluses  Abscess site was very tender to palpation and was spontaneously draining. Started on IV antibiotics Admitted to [facility] OB/GYN was consulted   Subjective: Patient was seen and examined this morning.  Pleasant elderly man Caucasian female. Lying on bed.  Not in distress. Still cannot have pelvic pain when walking.  Gradual improving. Chart reviewed.  In the last 24 hours, no fever, hemodynamically stable Last set of blood work from this morning with WC count normal at 10.3, hemoglobin at 10.2  Assessment and plan: Sepsis POA Left labial abscess  Spontaneously draining.  Did not require I&D CT pelvis 10/15 showed inflammation, skin thickening, edema in the left labia as well as left perineal area and left groin region without any discrete fluid collection or abscess. Currently on IV antibiotics Cultures from the drainage showed few trep anginosus and Enterococcus faecalis, pansensitive improving tenderness.  No fever. OB/GYN follow-up appreciated.  Switch to oral antibiotics today.  Chronic combined CHF Hypertension Follows up with heart failure service.  Most recent echo from December 2023 with improved EF  PTA meds- Coreg 6.25 mg twice daily, Lasix 20 mg daily, Entresto 24-26 mg twice daily, Aldactone 25 mg daily Currently on Coreg 3.125 mg twice daily, Lasix 20 mg daily. Blood pressure rising up with resolution of sepsis. Since  her renal function has improved as well, I would increase Coreg to home dose and resume Entresto and Aldactone as well. Continue to monitor blood pressure and renal function  AKI Creatinine normal at baseline.  Present with creatinine elevated to 1.5.  Improved with hydration. Monitor renal function with resumption of heart failure meds. Recent Labs    11/24/22 1025 05/03/23 1210 05/18/23 1018 06/07/23 1544 06/08/23 0427 06/09/23 0523 06/10/23 0453 06/11/23 0726 06/12/23 1013 06/13/23 0521  BUN 17 21 24  30* 28* 32* 28* 23 15 13   CREATININE 0.91 1.18* 0.95 1.50* 1.25* 1.52* 1.40* 1.19* 1.10* 1.08*   Hypokalemia Potassium level improved with replacement  Recent Labs  Lab 06/08/23 0427 06/09/23 0523 06/10/23 0453 06/11/23 0726 06/12/23 1013 06/13/23 0521  K 4.3 4.0 3.6 3.4* 4.2 4.3  MG 1.8 2.0 2.1 2.1  --  1.9  PHOS 2.5  --   --   --   --   --    Type 2 diabetes mellitus uncontrolled with hyperglycemia A1c 9.3 on 05/10/2023 PTA meds-Lantus 25 to 40 units daily, metformin 1000 mg twice daily and Mounjaro weekly Currently on Semglee 30 units daily, NovoLog 3 units 3 times daily and SSI/Accu-Cheks Continue to monitor Recent Labs  Lab 06/12/23 1202 06/12/23 1538 06/12/23 2125 06/13/23 0910 06/13/23 1140  GLUCAP 278* 121* 143* 234* 190*    Hypothyroidism continue Synthroid   Bibasilar atelectasis  Noted in CT scan from 10/15.   Encourage ambulation and lung exercise.   CAD/PAD/HLD continue on aspirin.  She is on Repatha as an outpatient   OSA CPAP nightly   H/o endometrial cancer  S/p surgery, chemoradiation outpatient follow-up with Dr. Pricilla Holm   Morbid Obesity  Body mass index is 47.17 kg/m. Patient has been advised to make an attempt to improve diet and exercise patterns to aid in weight loss.   Mobility: Encourage ambulation.  PT eval obtained  Goals of care   Code Status: Full Code    DVT prophylaxis:  enoxaparin (LOVENOX) injection 40 mg  Start: 06/11/23 1400 SCDs Start: 06/07/23 2228   Antimicrobials: Augmentin and Zyvox Fluid: None Consultants: OB/GYN Family Communication: None at bedside  Status: Inpatient Level of care:  Telemetry Medical   Patient is from: Home Needs to continue in-hospital care: Continues to have pelvic pain.  Reinitiated on multiple seizure meds today.  Needs 24 hours of monitoring Anticipated d/c to: Hopefully home tomorrow   Diet:  Diet Order             Diet Carb Modified Fluid consistency: Thin; Room service appropriate? Yes  Diet effective now                   Scheduled Meds:  amoxicillin-clavulanate  1 tablet Oral Q12H   aspirin  81 mg Oral Daily   carvedilol  6.25 mg Oral BID WC   enoxaparin (LOVENOX) injection  40 mg Subcutaneous Q24H   furosemide  20 mg Oral Daily   insulin aspart  0-15 Units Subcutaneous TID WC   insulin aspart  0-5 Units Subcutaneous QHS   insulin aspart  3 Units Subcutaneous TID WC   insulin glargine-yfgn  30 Units Subcutaneous Daily   levothyroxine  50 mcg Oral Q0600   linezolid  600 mg Oral BID   sacubitril-valsartan  1 tablet Oral BID   silver sulfADIAZINE   Topical TID   sodium chloride flush  3 mL Intravenous Q12H   spironolactone  25 mg Oral Daily    PRN meds: acetaminophen, albuterol, lidocaine, magnesium hydroxide, polyethylene glycol, traMADol   Infusions:     Antimicrobials: Anti-infectives (From admission, onward)    Start     Dose/Rate Route Frequency Ordered Stop   06/13/23 1800  linezolid (ZYVOX) tablet 600 mg        600 mg Oral 2 times daily 06/13/23 0942     06/13/23 1200  amoxicillin-clavulanate (AUGMENTIN) 875-125 MG per tablet 1 tablet        1 tablet Oral Every 12 hours 06/13/23 0942     06/12/23 1800  vancomycin (VANCOCIN) IVPB 1000 mg/200 mL premix  Status:  Discontinued        1,000 mg 200 mL/hr over 60 Minutes Intravenous Every 24 hours 06/11/23 1952 06/13/23 0942   06/08/23 1800  vancomycin (VANCOREADY) IVPB  1500 mg/300 mL  Status:  Discontinued        1,500 mg 150 mL/hr over 120 Minutes Intravenous Every 24 hours 06/08/23 0147 06/08/23 0804   06/08/23 1800  vancomycin (VANCOREADY) IVPB 1250 mg/250 mL  Status:  Discontinued        1,250 mg 166.7 mL/hr over 90 Minutes Intravenous Every 24 hours 06/08/23 0804 06/11/23 1952   06/08/23 1600  cefTRIAXone (ROCEPHIN) 2 g in sodium chloride 0.9 % 100 mL IVPB  Status:  Discontinued        2 g 200 mL/hr over 30 Minutes Intravenous Every 24 hours 06/07/23 2227 06/08/23 1045   06/08/23 1400  piperacillin-tazobactam (ZOSYN) IVPB 3.375 g  Status:  Discontinued        3.375 g 100 mL/hr over 30 Minutes Intravenous Every 8 hours  06/08/23 1046 06/08/23 1047   06/08/23 1100  piperacillin-tazobactam (ZOSYN) IVPB 3.375 g  Status:  Discontinued        3.375 g 12.5 mL/hr over 240 Minutes Intravenous Every 8 hours 06/08/23 1045 06/13/23 0942   06/07/23 2300  metroNIDAZOLE (FLAGYL) tablet 500 mg  Status:  Discontinued        500 mg Oral Every 8 hours 06/07/23 2225 06/08/23 1045   06/07/23 1745  vancomycin (VANCOCIN) 2,500 mg in sodium chloride 0.9 % 500 mL IVPB        2,500 mg 262.5 mL/hr over 120 Minutes Intravenous  Once 06/07/23 1736 06/07/23 2123   06/07/23 1715  vancomycin (VANCOCIN) IVPB 1000 mg/200 mL premix  Status:  Discontinued        1,000 mg 200 mL/hr over 60 Minutes Intravenous  Once 06/07/23 1705 06/07/23 1733   06/07/23 1530  cefTRIAXone (ROCEPHIN) 2 g in sodium chloride 0.9 % 100 mL IVPB        2 g 200 mL/hr over 30 Minutes Intravenous  Once 06/07/23 1519 06/07/23 1729       Objective: Vitals:   06/13/23 0808 06/13/23 1143  BP: (!) 141/77 121/69  Pulse: 95 88  Resp: 19 18  Temp: 97.7 F (36.5 C) 98.2 F (36.8 C)  SpO2: 97% 98%    Intake/Output Summary (Last 24 hours) at 06/13/2023 1525 Last data filed at 06/13/2023 1230 Gross per 24 hour  Intake 846 ml  Output --  Net 846 ml   Filed Weights   06/07/23 1916 06/09/23 0354 06/12/23  0623  Weight: 132.5 kg (!) 137.4 kg (!) 136.6 kg   Weight change:  Body mass index is 47.17 kg/m.   Physical Exam: General exam: Pleasant elderly female.  Not in physical distress at rest Skin: No rashes, lesions or ulcers. HEENT: Atraumatic, normocephalic, no obvious bleeding Lungs: Clear to auscultation bilaterally CVS: Regular rate and rhythm, no murmur GI/Abd soft, nontender, nondistended, bowel sound present CNS: Alert, awake, oriented x 3 Psychiatry: Mood appropriate  Extremities: No pedal edema, no calf tenderness  Data Review: I have personally reviewed the laboratory data and studies available.  F/u labs ordered Unresulted Labs (From admission, onward)    None       Total time spent in review of labs and imaging, patient evaluation, formulation of plan, documentation and communication with family: 45 minutes  Signed, Lorin Glass, MD [facility] 06/13/2023

## 2023-06-13 NOTE — Plan of Care (Signed)

## 2023-06-13 NOTE — Progress Notes (Signed)
[facility] OB/GYN Attending Progress Note  ADMISSION DIAGNOSIS:   Principal Problem:   Labial abscess Active Problems:   Cellulitis of perineum   Hyperglycemia due to diabetes mellitus (HCC)   Subjective  Continues to show signs of improvement.  Denies fever/chills.  Still noting some drainage from the area.  Pt able to void, but notes she is requiring assistance to get up to void.  Anticipated discomfort with movement, but manageable with current pain regimen.    Objective  VITALS:  height is 5\' 7"  (1.702 m) and weight is 136.6 kg (abnormal). Her oral temperature is 98.2 F (36.8 C). Her blood pressure is 121/69 and her pulse is 88. Her respiration is 18 and oxygen saturation is 98%.   EXAMINATION: CONSTITUTIONAL: Well-developed, well-nourished female in no acute distress.  NECK: Normal range of motion, supple, no masses SKIN: Skin is warm and dry. Not diaphorectic NEUROLOGIC: Alert and oriented to person, place, and time. PSYCHIATRIC: Normal mood and affect. Normal behavior. Normal judgment and thought content. CARDIOVASCULAR: Normal heart rate noted RESPIRATORY: Effort and breath sounds normal, no problems with respiration noted MUSCULOSKELETAL: No edema and no calf tenderness bilaterally ABDOMEN: obese, soft and non-tender GU:  Significant improvement noted today- erythema marked improved.  Edema continues to decline.  Evidence of purulent drainage still noted.   Laboratory Reports: Results for orders placed or performed during the hospital encounter of 06/07/23 (from the past 72 hour(s))  Glucose, capillary     Status: Abnormal   Collection Time: 06/10/23  4:27 PM  Result Value Ref Range   Glucose-Capillary 255 (H) 70 - 99 mg/dL    Comment: Glucose reference range applies only to samples taken after fasting for at least 8 hours.  Glucose, capillary     Status: Abnormal   Collection Time: 06/10/23  8:13 PM  Result Value Ref Range    Glucose-Capillary 227 (H) 70 - 99 mg/dL    Comment: Glucose reference range applies only to samples taken after fasting for at least 8 hours.  Vancomycin, peak     Status: None   Collection Time: 06/10/23  8:21 PM  Result Value Ref Range   Vancomycin Pk 40 30 - 40 ug/mL    Comment: Performed at [facility], [address], [place], [place] [address]  Glucose, capillary     Status: Abnormal   Collection Time: 06/11/23  6:50 AM  Result Value Ref Range   Glucose-Capillary 236 (H) 70 - 99 mg/dL    Comment: Glucose reference range applies only to samples taken after fasting for at least 8 hours.  Basic metabolic panel     Status: Abnormal   Collection Time: 06/11/23  7:26 AM  Result Value Ref Range   Sodium 133 (L) 135 - 145 mmol/L   Potassium 3.4 (L) 3.5 - 5.1 mmol/L   Chloride 103 98 - 111 mmol/L   CO2 21 (L) 22 - 32 mmol/L   Glucose, Bld 211 (H) 70 - 99 mg/dL    Comment: Glucose reference range applies only to samples taken after fasting for at least 8 hours.   BUN 23 8 - 23 mg/dL   Creatinine, Ser 1.91 (H) 0.44 - 1.00 mg/dL   Calcium 8.5 (L) 8.9 - 10.3 mg/dL   GFR, Estimated 50 (L) >60 mL/min    Comment: (NOTE) Calculated using the CKD-EPI Creatinine Equation (2021)    Anion gap 9 5 - 15  Comment: Performed at [facility], [address] [place], [place] [address]  Magnesium     Status: None   Collection Time: 06/11/23  7:26 AM  Result Value Ref Range   Magnesium 2.1 1.7 - 2.4 mg/dL    Comment: Performed at [facility], [address], [place], [place] [address]  CBC     Status: Abnormal   Collection Time: 06/11/23  7:26 AM  Result Value Ref Range   WBC 12.7 (H) 4.0 - 10.5 K/uL   RBC 3.54 (L) 3.87 - 5.11 MIL/uL   Hemoglobin 9.9 (L) 12.0 - 15.0 g/dL   HCT 65.7 (L) 84.6 - 96.2 %   MCV 86.4 80.0 - 100.0 fL   MCH 28.0 26.0 - 34.0 pg   MCHC 32.4 30.0 - 36.0 g/dL   RDW 95.2 84.1 - 32.4 %   Platelets 178 150 - 400 K/uL   nRBC 0.0 0.0 - 0.2 %    Comment:  Performed at [facility], [address], [place], [place] [address]  Glucose, capillary     Status: Abnormal   Collection Time: 06/11/23  8:13 AM  Result Value Ref Range   Glucose-Capillary 264 (H) 70 - 99 mg/dL    Comment: Glucose reference range applies only to samples taken after fasting for at least 8 hours.  Glucose, capillary     Status: Abnormal   Collection Time: 06/11/23 11:42 AM  Result Value Ref Range   Glucose-Capillary 261 (H) 70 - 99 mg/dL    Comment: Glucose reference range applies only to samples taken after fasting for at least 8 hours.  Glucose, capillary     Status: Abnormal   Collection Time: 06/11/23  3:49 PM  Result Value Ref Range   Glucose-Capillary 162 (H) 70 - 99 mg/dL    Comment: Glucose reference range applies only to samples taken after fasting for at least 8 hours.  Vancomycin, trough     Status: Abnormal   Collection Time: 06/11/23  5:35 PM  Result Value Ref Range   Vancomycin Tr 13 (L) 15 - 20 ug/mL    Comment: Performed at [facility], [address] [place], [place] [address]  Glucose, capillary     Status: Abnormal   Collection Time: 06/11/23  8:59 PM  Result Value Ref Range   Glucose-Capillary 150 (H) 70 - 99 mg/dL    Comment: Glucose reference range applies only to samples taken after fasting for at least 8 hours.  Glucose, capillary     Status: Abnormal   Collection Time: 06/12/23  8:25 AM  Result Value Ref Range   Glucose-Capillary 260 (H) 70 - 99 mg/dL    Comment: Glucose reference range applies only to samples taken after fasting for at least 8 hours.  Basic metabolic panel     Status: Abnormal   Collection Time: 06/12/23 10:13 AM  Result Value Ref Range   Sodium 136 135 - 145 mmol/L   Potassium 4.2 3.5 - 5.1 mmol/L   Chloride 105 98 - 111 mmol/L   CO2 21 (L) 22 - 32 mmol/L   Glucose, Bld 279 (H) 70 - 99 mg/dL    Comment: Glucose reference range applies only to samples taken after fasting for at least 8 hours.   BUN 15 8  - 23 mg/dL   Creatinine, Ser 6.44 (H) 0.44 - 1.00 mg/dL   Calcium 8.7 (L) 8.9 - 10.3 mg/dL   GFR, Estimated 55 (L) >60  mL/min    Comment: (NOTE) Calculated using the CKD-EPI Creatinine Equation (2021)    Anion gap 10 5 - 15    Comment: Performed at [facility], [address], [place], [place] [address]  CBC     Status: Abnormal   Collection Time: 06/12/23 10:13 AM  Result Value Ref Range   WBC 11.7 (H) 4.0 - 10.5 K/uL   RBC 3.52 (L) 3.87 - 5.11 MIL/uL   Hemoglobin 9.9 (L) 12.0 - 15.0 g/dL   HCT 19.1 (L) 47.8 - 29.5 %   MCV 86.1 80.0 - 100.0 fL   MCH 28.1 26.0 - 34.0 pg   MCHC 32.7 30.0 - 36.0 g/dL   RDW 62.1 30.8 - 65.7 %   Platelets 214 150 - 400 K/uL   nRBC 0.0 0.0 - 0.2 %    Comment: Performed at [facility], [address] [place], [place] [address]  Glucose, capillary     Status: Abnormal   Collection Time: 06/12/23 12:02 PM  Result Value Ref Range   Glucose-Capillary 278 (H) 70 - 99 mg/dL    Comment: Glucose reference range applies only to samples taken after fasting for at least 8 hours.  Glucose, capillary     Status: Abnormal   Collection Time: 06/12/23  3:38 PM  Result Value Ref Range   Glucose-Capillary 121 (H) 70 - 99 mg/dL    Comment: Glucose reference range applies only to samples taken after fasting for at least 8 hours.  Glucose, capillary     Status: Abnormal   Collection Time: 06/12/23  9:25 PM  Result Value Ref Range   Glucose-Capillary 143 (H) 70 - 99 mg/dL    Comment: Glucose reference range applies only to samples taken after fasting for at least 8 hours.  Comprehensive metabolic panel     Status: Abnormal   Collection Time: 06/13/23  5:21 AM  Result Value Ref Range   Sodium 135 135 - 145 mmol/L   Potassium 4.3 3.5 - 5.1 mmol/L   Chloride 100 98 - 111 mmol/L   CO2 23 22 - 32 mmol/L   Glucose, Bld 158 (H) 70 - 99 mg/dL    Comment: Glucose reference range applies only to samples taken after fasting for at least 8 hours.   BUN 13 8 -  23 mg/dL   Creatinine, Ser 2.95 (H) 0.44 - 1.00 mg/dL   Calcium 9.0 8.9 - 28.4 mg/dL   Total Protein 6.6 6.5 - 8.1 g/dL   Albumin 1.9 (L) 3.5 - 5.0 g/dL   AST 24 15 - 41 U/L   ALT 21 0 - 44 U/L   Alkaline Phosphatase 101 38 - 126 U/L   Total Bilirubin 0.5 0.3 - 1.2 mg/dL   GFR, Estimated 57 (L) >60 mL/min    Comment: (NOTE) Calculated using the CKD-EPI Creatinine Equation (2021)    Anion gap 12 5 - 15    Comment: Performed at [facility], [address], [place], [place] [address]  CBC     Status: Abnormal   Collection Time: 06/13/23  5:21 AM  Result Value Ref Range   WBC 10.3 4.0 - 10.5 K/uL   RBC 3.58 (L) 3.87 - 5.11 MIL/uL   Hemoglobin 10.2 (L) 12.0 - 15.0 g/dL   HCT 01.0 (L) 27.2 - 53.6 %   MCV 88.0 80.0 - 100.0 fL   MCH 28.5 26.0 - 34.0 pg   MCHC 32.4 30.0 - 36.0 g/dL  RDW 15.4 11.5 - 15.5 %   Platelets 335 150 - 400 K/uL   nRBC 0.0 0.0 - 0.2 %    Comment: Performed at [facility], [address] [place], [place] [address]  Magnesium     Status: None   Collection Time: 06/13/23  5:21 AM  Result Value Ref Range   Magnesium 1.9 1.7 - 2.4 mg/dL    Comment: Performed at [facility], [address], [place], [place] [address]  Glucose, capillary     Status: Abnormal   Collection Time: 06/13/23  9:10 AM  Result Value Ref Range   Glucose-Capillary 234 (H) 70 - 99 mg/dL    Comment: Glucose reference range applies only to samples taken after fasting for at least 8 hours.  Glucose, capillary     Status: Abnormal   Collection Time: 06/13/23 11:40 AM  Result Value Ref Range   Glucose-Capillary 190 (H) 70 - 99 mg/dL    Comment: Glucose reference range applies only to samples taken after fasting for at least 8 hours.      ASSESSMENT/PLAN: Vulvar abscess -reviewed CT- do not think I&D/drainage indicated -continue with antibiotics- per IM plan to move towards oral management -continue with current oral pain medication  Appreciated primary care  management regarding other co-morbidities   DVT Prophylaxis:  encourage ambulation, Lovenox daily Code Status: Full Disposition Plan:  Discussed with pt possible discharge- she discussed concerns regarding ambulation/abscess care at home.  Will review with primary team.  Pt seen by Dr. Hyacinth Meeker at [facility] and we will arrange outpt follow up from our end as well.  Pt will likely need to be seen outpt by both GYN and IM   Myna Hidalgo, DO Attending Obstetrician & Gynecologist, [facility] for [employer], [facility]

## 2023-06-13 NOTE — Progress Notes (Signed)
Pt is improved. Culture is back. D/w Dr. Pola Corn and Dr Charlotta Newton, we will change abx to linezolid + Augmentin since corynebacterium is usually resistant to beta lactam.  10/10 abscess cs: strep anginosis, e. Faecalis pan sens, corynebacterium amycolatum, e.coli pan sens   Linezolid 600mg  PO BID Augmentin 875mg  PO BID  Ulyses Southward, PharmD, [place], AAHIVP, CPP Infectious Disease Pharmacist 06/13/2023 9:46 AM

## 2023-06-13 NOTE — Inpatient Diabetes Management (Signed)
Inpatient Diabetes Program Recommendations  AACE/ADA: New Consensus Statement on Inpatient Glycemic Control (2015)  Target Ranges:  Prepandial:   less than 140 mg/dL      Peak postprandial:   less than 180 mg/dL (1-2 hours)      Critically ill patients:  140 - 180 mg/dL   Lab Results  Component Value Date   GLUCAP 143 (H) 06/12/2023   HGBA1C 9.3 (H) 05/03/2023    Review of Glycemic Control  Latest Reference Range & Units 06/12/23 08:25 06/12/23 12:02 06/12/23 15:38 06/12/23 21:25  Glucose-Capillary 70 - 99 mg/dL 161 (H)  Novolog 11 units 278 (H)  Novolog 11 units 121 (H)  Novolog 5 units 143 (H)   Diabetes history: DM 2 Outpatient Diabetes medications: Lantus 25 -40 units Daily, Metformin 1000 mg bid, Mounjaro 2.5 mg weekly Current orders for Inpatient glycemic control:  Semglee 30 units Novolog 0-15 units tid + hs Novolog 3 units tid meal coverage  Note: Pt receiving Novolog 11 units with each meal to cover trends  Inpatient Diabetes Program Recommendations:    -   Increase Novolog meal coverage to 5 units tid if eating >50% of meals  Thanks,  Christena Deem RN, MSN, BC-ADM Inpatient Diabetes Coordinator Team Pager [phone] (8a-5p)

## 2023-06-13 NOTE — Plan of Care (Signed)

## 2023-06-13 NOTE — Progress Notes (Signed)
Occupational Therapy Treatment Patient Details Name: Terri Heath MRN: [id number] DOB: [birth year] Today's Date: 06/13/2023   History of present illness Pt presenting 10/10 with 3 abscesses in perineal area. Found to be septic. PMHx of recurrent labial abscess, DMII, morbid obesity, endometrial cancer with history of chemoradiation, heart failure with improved ejection fraction, CAD, PAD, MGUS, HTN, HLD   OT comments  Pt requires up to min assist for bed mobility. Ambulated with cane and reaching for furniture to bathroom. Completed pericare on toilet and bed level mod I to set up. Pt reports hx of sciatica. Placed orders for PT consult. Recommended pt use her walker when she initially returns home. Pt's husband can provide limited physical assist. Educated in availability of AE for LB ADLs, will demonstrate next visit.       If plan is discharge home, recommend the following:  A little help with walking and/or transfers;A lot of help with bathing/dressing/bathroom;Assist for transportation;Help with stairs or ramp for entrance;Assistance with feeding   Equipment Recommendations  None recommended by OT    Recommendations for Other Services      Precautions / Restrictions Precautions Precautions: Fall Precaution Comments: hx of falls Restrictions Weight Bearing Restrictions: No       Mobility Bed Mobility Overal bed mobility: Needs Assistance Bed Mobility: Supine to Sit, Sit to Supine     Supine to sit: Min assist Sit to supine: Modified independent (Device/Increase time)   General bed mobility comments: assist to raise trunk    Transfers Overall transfer level: Needs assistance Equipment used: Straight cane Transfers: Sit to/from Stand Sit to Stand: Supervision           General transfer comment: from bed and toilet     Balance Overall balance assessment: Needs assistance   Sitting balance-Leahy Scale: Fair     Standing balance support: Single extremity  supported Standing balance-Leahy Scale: Poor Standing balance comment: reliant on cane                           ADL either performed or assessed with clinical judgement   ADL Overall ADL's : Needs assistance/impaired                         Toilet Transfer: Contact guard assist;Ambulation;Grab bars;Regular Toilet (cane)   Toileting- Clothing Manipulation and Hygiene: Modified independent;Set up;Bed level       Functional mobility during ADLs: Contact guard assist;Cane      Extremity/Trunk Assessment              Vision       Perception     Praxis      Cognition Arousal: Alert Behavior During Therapy: WFL for tasks assessed/performed Overall Cognitive Status: Within Functional Limits for tasks assessed                                          Exercises      Shoulder Instructions       General Comments      Pertinent Vitals/ Pain       Pain Assessment Pain Assessment: Faces Faces Pain Scale: Hurts little more Pain Location: perineal area Pain Descriptors / Indicators: Grimacing, Guarding, Discomfort Pain Intervention(s): Repositioned  Home Living  Prior Functioning/Environment              Frequency  Min 1X/week        Progress Toward Goals  OT Goals(current goals can now be found in the care plan section)  Progress towards OT goals: Progressing toward goals  Acute Rehab OT Goals OT Goal Formulation: With patient Time For Goal Achievement: 06/23/23 Potential to Achieve Goals: Good  Plan      Co-evaluation                 AM-PAC OT "6 Clicks" Daily Activity     Outcome Measure   Help from another person eating meals?: None Help from another person taking care of personal grooming?: A Little Help from another person toileting, which includes using toliet, bedpan, or urinal?: A Little Help from another person bathing  (including washing, rinsing, drying)?: A Lot Help from another person to put on and taking off regular upper body clothing?: A Little Help from another person to put on and taking off regular lower body clothing?: A Lot 6 Click Score: 17    End of Session Equipment Utilized During Treatment: Other (comment) (cane)  OT Visit Diagnosis: Unsteadiness on feet (R26.81);Muscle weakness (generalized) (M62.81);Pain;History of falling (Z91.81)   Activity Tolerance Patient tolerated treatment well   Patient Left in bed;with call bell/phone within reach;with bed alarm set   Nurse Communication          Time: 6045-4098 OT Time Calculation (min): 40 min  Charges: OT General Charges $OT Visit: 1 Visit OT Treatments $Self Care/Home Management : 38-52 mins Berna Spare, OTR/L Acute Rehabilitation Services Office: [phone]   Evern Bio 06/13/2023, 11:27 AM

## 2023-06-14 ENCOUNTER — Other Ambulatory Visit (HOSPITAL_COMMUNITY): Payer: Self-pay

## 2023-06-14 ENCOUNTER — Other Ambulatory Visit (HOSPITAL_BASED_OUTPATIENT_CLINIC_OR_DEPARTMENT_OTHER): Payer: Self-pay

## 2023-06-14 DIAGNOSIS — N764 Abscess of vulva: Secondary | ICD-10-CM | POA: Diagnosis not present

## 2023-06-14 LAB — GLUCOSE, CAPILLARY
Glucose-Capillary: 194 mg/dL — ABNORMAL HIGH (ref 70–99)
Glucose-Capillary: 239 mg/dL — ABNORMAL HIGH (ref 70–99)

## 2023-06-14 MED ORDER — AMOXICILLIN-POT CLAVULANATE 875-125 MG PO TABS
1.0000 | ORAL_TABLET | Freq: Two times a day (BID) | ORAL | 0 refills | Status: DC
Start: 2023-06-14 — End: 2023-06-18
  Filled 2023-06-14 (×2): qty 10, 5d supply, fill #0

## 2023-06-14 MED ORDER — LINEZOLID 600 MG PO TABS
600.0000 mg | ORAL_TABLET | Freq: Two times a day (BID) | ORAL | 0 refills | Status: DC
Start: 2023-06-14 — End: 2023-06-19
  Filled 2023-06-14 (×2): qty 10, 5d supply, fill #0

## 2023-06-14 MED ORDER — TRAMADOL HCL 50 MG PO TABS
50.0000 mg | ORAL_TABLET | Freq: Four times a day (QID) | ORAL | 0 refills | Status: AC | PRN
Start: 2023-06-14 — End: 2023-06-20
  Filled 2023-06-14 (×2): qty 20, 5d supply, fill #0

## 2023-06-14 MED ORDER — SACCHAROMYCES BOULARDII 250 MG PO CAPS
250.0000 mg | ORAL_CAPSULE | Freq: Two times a day (BID) | ORAL | 0 refills | Status: AC
Start: 2023-06-14 — End: 2023-07-10
  Filled 2023-06-14: qty 20, 10d supply, fill #0
  Filled 2023-06-14: qty 50, 25d supply, fill #0

## 2023-06-14 NOTE — Discharge Summary (Addendum)
Physician Discharge Summary  Terri Heath ZOX:[id number] DOB: [birth year] DOA: 06/07/2023  PCP: Tollie Eth, NP  Admit date: 06/07/2023 Discharge date: 06/14/2023  Admitted From: Home Discharge disposition: Home with home health  Recommendations at discharge:  Continue Augmentin and Zyvox for 5 more days with probiotics Follow-up with GYN as an outpatient Cautious use of tramadol for pain control   Brief narrative: Terri Heath is a 67 y.o. female with PMH significant for morbid obesity, DM2, HTN, HLD, chronic CHF with improved EF, CAD, PAD, MGUS recurrent labial abscess, endometrial cancer s/p surgery/chemoradiation 10/10, patient presented to the ED with complaint of few days of painful wound and malodorous drainage from the labial abscess.   In the ED she was found to be septic, received few IV boluses  Abscess site was very tender to palpation and was spontaneously draining. Started on IV antibiotics Admitted to [facility] OB/GYN was consulted   Subjective: Patient was seen and examined this morning.  Pleasant elderly Caucasian female.  Not in distress. Hemodynamically stable.  No fever. Continues to have pelvic pain from the wound but is overall improving.  Hospital course: Sepsis POA Left labial abscess  Spontaneously draining. Did not require I&D CT pelvis 10/15 showed inflammation, skin thickening, edema in the left labia as well as left perineal area and left groin region without any discrete fluid collection or abscess. Initially treated with IV antibiotics Cultures from the drainage showed few trep anginosus and Enterococcus faecalis, pansensitive improving tenderness.  No fever. OB/GYN follow-up appreciated.   10/16, switched to oral Augmentin and Zyvox. Discharge to continue both antibiotics for 5 more days with probiotics  Chronic combined CHF Hypertension Follows up with heart failure service.  Most recent echo from December 2023 with improved EF   PTA meds- Coreg 6.25 mg twice daily, Lasix 20 mg daily, Entresto 24-26 mg twice daily, Aldactone 25 mg daily Currently continued on all.  Blood pressure and kidney function remained stable.  AKI Creatinine normal at baseline.  Present with creatinine elevated to 1.5.  Improved with hydration. Monitor renal function with resumption of heart failure meds. Recent Labs    11/24/22 1025 05/03/23 1210 05/18/23 1018 06/07/23 1544 06/08/23 0427 06/09/23 0523 06/10/23 0453 06/11/23 0726 06/12/23 1013 06/13/23 0521  BUN 17 21 24  30* 28* 32* 28* 23 15 13   CREATININE 0.91 1.18* 0.95 1.50* 1.25* 1.52* 1.40* 1.19* 1.10* 1.08*   Hypokalemia Potassium level improved with replacement  Recent Labs  Lab 06/08/23 0427 06/09/23 0523 06/10/23 0453 06/11/23 0726 06/12/23 1013 06/13/23 0521  K 4.3 4.0 3.6 3.4* 4.2 4.3  MG 1.8 2.0 2.1 2.1  --  1.9  PHOS 2.5  --   --   --   --   --    Type 2 diabetes mellitus uncontrolled with hyperglycemia A1c 9.3 on 05/10/2023 PTA meds-Lantus 25 to 40 units daily, metformin 1000 mg twice daily and Mounjaro weekly Okay to continue same at home. Recent Labs  Lab 06/13/23 0910 06/13/23 1140 06/13/23 1546 06/13/23 2109 06/14/23 0811  GLUCAP 234* 190* 127* 147* 194*    Hypothyroidism continue Synthroid   Bibasilar atelectasis  Noted in CT scan from 10/15.   Encourage ambulation and lung exercise.   CAD/PAD/HLD continue on aspirin.  She is on Repatha as an outpatient   OSA CPAP nightly   H/o endometrial cancer  S/p surgery, chemoradiation outpatient follow-up with Dr. Pricilla Holm   Morbid Obesity  Body mass index is 46.34 kg/m. Patient has been  advised to make an attempt to improve diet and exercise patterns to aid in weight loss.  Impaired mobility PT eval obtained.  Home with PT recommended  Goals of care   Code Status: Full Code   Wounds:  - Wound / Incision (Open or Dehisced) 06/08/23 Other (Comment) Labia Left see picture from GYN  06/08/23 (Active)  Date First Assessed/Time First Assessed: 06/08/23 1600   Wound Type: (c) Other (Comment)  Location: Labia  Location Orientation: Left  Wound Description (Comments): see picture from GYN 06/08/23    Assessments 06/08/2023  4:00 PM 06/14/2023  8:00 AM  Dressing Type None --  Site / Wound Assessment Yellow;Red;Pink;Painful Red;Yellow  Drainage Amount -- Moderate  Drainage Description -- Purulent     No associated orders.    Discharge Exam:   Vitals:   06/14/23 0018 06/14/23 0449 06/14/23 0500 06/14/23 0812  BP: 132/69 (!) 117/59  129/69  Pulse: 88 89  93  Resp: 18 18  18   Temp: 97.9 F (36.6 C) 98.1 F (36.7 C)  97.9 F (36.6 C)  TempSrc:    Oral  SpO2: 98% 98%  97%  Weight:   134.2 kg   Height:        Body mass index is 46.34 kg/m.  General exam: Pleasant elderly female.  Not in physical distress at rest Skin: No rashes, lesions or ulcers. HEENT: Atraumatic, normocephalic, no obvious bleeding Lungs: Clear to auscultation bilaterally CVS: Regular rate and rhythm, no murmur GI/Abd soft, nontender, nondistended, bowel sound present CNS: Alert, awake, oriented x 3 Psychiatry: Mood appropriate  Extremities: No pedal edema, no calf tenderness  Follow ups:    Follow-up Information     Early, Sung Amabile, NP Follow up.   Specialty: Nurse Practitioner Contact information: [address] [place] [address] [phone]                 Discharge Instructions:   Discharge Instructions     Call MD for:  difficulty breathing, headache or visual disturbances   Complete by: As directed    Call MD for:  extreme fatigue   Complete by: As directed    Call MD for:  hives   Complete by: As directed    Call MD for:  persistant dizziness or light-headedness   Complete by: As directed    Call MD for:  persistant nausea and vomiting   Complete by: As directed    Call MD for:  severe uncontrolled pain   Complete by: As directed    Call MD for:   temperature >100.4   Complete by: As directed    Diet - low sodium heart healthy   Complete by: As directed    Diet Carb Modified   Complete by: As directed    Discharge instructions   Complete by: As directed    Recommendations at discharge:   Continue Augmentin and Zyvox for 5 more days with probiotics  Follow-up with GYN as an outpatient  Cautious use of tramadol for pain control  Discharge instructions for diabetes mellitus: Check blood sugar 3 times a day and bedtime at home. If blood sugar running above 200 or less than 70 please call your MD to adjust insulin. If you notice signs and symptoms of hypoglycemia (low blood sugar) like jitteriness, confusion, thirst, tremor and sweating, please check blood sugar, drink sugary drink/biscuits/sweets to increase sugar level and call MD or return to ER.    Discharge instructions for CHF Check weight daily -preferably  same time every day. Restrict fluid intake to 1200 ml daily Restrict salt intake to less than 2 g daily. Call MD if you have one of the following symptoms 1) 3 pound weight gain in 24 hours or 5 pounds in 1 week  2) swelling in the hands, feet or stomach  3) progressive shortness of breath 4) if you have to sleep on extra pillows at night in order to breathe     General discharge instructions: Follow with Primary MD Early, Sung Amabile, NP in 7 days  Please request your PCP  to go over your hospital tests, procedures, radiology results at the follow up. Please get your medicines reviewed and adjusted.  Your PCP may decide to repeat certain labs or tests as needed. Do not drive, operate heavy machinery, perform activities at heights, swimming or participation in water activities or provide baby sitting services if your were admitted for syncope or siezures until you have seen by Primary MD or a Neurologist and advised to do so again. [place] Controlled Substance Reporting System database was reviewed. Do not drive,  operate heavy machinery, perform activities at heights, swim, participate in water activities or provide baby-sitting services while on medications for pain, sleep and mood until your outpatient physician has reevaluated you and advised to do so again.  You are strongly recommended to comply with the dose, frequency and duration of prescribed medications. Activity: As tolerated with Full fall precautions use walker/cane & assistance as needed Avoid using any recreational substances like cigarette, tobacco, alcohol, or non-prescribed drug. If you experience worsening of your admission symptoms, develop shortness of breath, life threatening emergency, suicidal or homicidal thoughts you must seek medical attention immediately by calling 911 or calling your MD immediately  if symptoms less severe. You must read complete instructions/literature along with all the possible adverse reactions/side effects for all the medicines you take and that have been prescribed to you. Take any new medicine only after you have completely understood and accepted all the possible adverse reactions/side effects.  Wear Seat belts while driving. You were cared for by a hospitalist during your hospital stay. If you have any questions about your discharge medications or the care you received while you were in the hospital after you are discharged, you can call the unit and ask to speak with the hospitalist or the covering physician. Once you are discharged, your primary care physician will handle any further medical issues. Please note that NO REFILLS for any discharge medications will be authorized once you are discharged, as it is imperative that you return to your primary care physician (or establish a relationship with a primary care physician if you do not have one).   Discharge wound care:   Complete by: As directed    Increase activity slowly   Complete by: As directed        Discharge Medications:   Allergies as of  06/14/2023       Reactions   Amoxicillin Other (See Comments)   Dizziness, abdominal pain   Adhesive [tape] Rash   Tegaderm   Exenatide Other (See Comments)   Flu-like symptoms   Invokana [canagliflozin] Other (See Comments)   Yeast infection/dehydration   Jardiance [empagliflozin] Other (See Comments)   Yeast infections and dehydration   Lisinopril Other (See Comments)   cramping   Other Diarrhea, Other (See Comments)   Kale=food Per patient, it causes GI bleeding    Statins Other (See Comments)   Leg cramps  Medication List     TAKE these medications    acetaminophen 500 MG tablet Commonly known as: TYLENOL Take 500-1,000 mg by mouth every 6 (six) hours as needed for mild pain or moderate pain.   albuterol 108 (90 Base) MCG/ACT inhaler Commonly known as: ProAir HFA Inhale 2 puffs into the lungs every 6 (six) hours as needed. wheezing   amoxicillin-clavulanate 875-125 MG tablet Commonly known as: AUGMENTIN Take 1 tablet by mouth every 12 (twelve) hours for 5 days.   aspirin 81 MG chewable tablet Chew 1 tablet (81 mg total) by mouth daily.   blood glucose meter kit and supplies Use up to four times daily as directed. (FOR ICD-10 E10.9, E11.9).   carvedilol 6.25 MG tablet Commonly known as: COREG Take 1 tablet (6.25 mg total) by mouth 2 (two) times daily.   Entresto 24-26 MG Generic drug: sacubitril-valsartan Take 1 tablet by mouth 2 (two) times daily.   furosemide 20 MG tablet Commonly known as: LASIX Take 1 tablet (20 mg total) by mouth daily.   glucose blood test strip Use as directed to test blood sugar   Lantus SoloStar 100 UNIT/ML Solostar Pen Generic drug: insulin glargine Inject 25-40 Units into the skin daily.   levothyroxine 50 MCG tablet Commonly known as: SYNTHROID Take 1 tablet (50 mcg total) by mouth daily, before a meal.   linezolid 600 MG tablet Commonly known as: ZYVOX Take 1 tablet (600 mg total) by mouth 2 (two) times  daily for 5 days.   magnesium oxide 400 (240 Mg) MG tablet Commonly known as: MAG-OX Take 1 tablet (400 mg total) by mouth daily.   metFORMIN 1000 MG tablet Commonly known as: GLUCOPHAGE Take 1 tablet (1,000 mg total) by mouth 2 (two) times daily with a meal.   Mounjaro 2.5 MG/0.5ML Pen Generic drug: tirzepatide Inject 2.5 mg into the skin once a week.   multivitamin with minerals Tabs tablet Take 1 tablet by mouth daily with breakfast.   naproxen sodium 220 MG tablet Commonly known as: ALEVE Take 440 mg by mouth daily as needed (pain).   OneTouch Delica Lancets 33G Misc use as directed   Repatha SureClick 140 MG/ML Soaj Generic drug: Evolocumab Inject 140 mg into the skin every 14 (fourteen) days.   saccharomyces boulardii 250 MG capsule Commonly known as: FLORASTOR Take 1 capsule (250 mg total) by mouth 2 (two) times daily for 5 days.   spironolactone 25 MG tablet Commonly known as: ALDACTONE Take 1 tablet (25 mg total) by mouth daily.   traMADol 50 MG tablet Commonly known as: ULTRAM Take 1 tablet (50 mg total) by mouth every 6 (six) hours as needed for up to 5 days for severe pain (pain score 7-10) or moderate pain (pain score 4-6).   triamcinolone ointment 0.1 % Commonly known as: KENALOG Apply 1 application topically two times daily to affected area(s) as needed, sparing use to avoid whitening/thinning skin               Discharge Care Instructions  (From admission, onward)           Start     Ordered   06/14/23 0000  Discharge wound care:        06/14/23 1312             The results of significant diagnostics from this hospitalization (including imaging, microbiology, ancillary and laboratory) are listed below for reference.    Procedures and Diagnostic Studies:   CT ABDOMEN PELVIS  W CONTRAST  Result Date: 06/07/2023 CLINICAL DATA:  Peroneal abscess. Patient has 3 abscesses 1 near labia, 1 new pelvis, 1 year rectum. The abscess near  the rectum is painful and has brown discharge that started on Monday. EXAM: CT ABDOMEN AND PELVIS WITH CONTRAST TECHNIQUE: Multidetector CT imaging of the abdomen and pelvis was performed using the standard protocol following bolus administration of intravenous contrast. RADIATION DOSE REDUCTION: This exam was performed according to the departmental dose-optimization program which includes automated exposure control, adjustment of the mA and/or kV according to patient size and/or use of iterative reconstruction technique. CONTRAST:  65mL OMNIPAQUE IOHEXOL 350 MG/ML SOLN COMPARISON:  CT abdomen and pelvis 09/04/2022 FINDINGS: Lower chest: No acute abnormality. Hepatobiliary: Unremarkable liver. Normal gallbladder. No biliary dilation. Pancreas: Unremarkable. Spleen: Unremarkable. Adrenals/Urinary Tract: Normal adrenal glands. No urinary calculi or hydronephrosis. Bladder is unremarkable. Stomach/Bowel: Normal caliber large and small bowel. Colonic diverticulosis without diverticulitis. The appendix is not visualized.Stomach is within normal limits. Vascular/Lymphatic: Aortic atherosclerosis. No enlarged abdominal or pelvic lymph nodes. Reproductive: Status post hysterectomy. No adnexal masses. Other: No free intraperitoneal fluid or air. Moderate stranding and edema within the left labia. Small 1.4 cm focus of soft tissue thickening along the medial left labia with tiny focus of gas (series 12/image 126). The inflammatory stranding extends superiorly into the mons pubis. No drainable fluid collection. No evidence of deep tissue infection. Musculoskeletal: No acute fracture. Chronic fractures of the right inferior superior pubic rami. Sclerosis about the left-greater-than-right SI joints. IMPRESSION: Inflammatory stranding and edema within the left labia extending into the mons pubis. Small 1.4 cm focus of soft tissue thickening along the left labia with tiny focus of gas compatible with phlegmon. No drainable fluid  collection. Aortic Atherosclerosis (ICD10-I70.0). Electronically Signed   By: Minerva Fester M.D.   On: 06/07/2023 20:14   DG Chest Port 1 View  Result Date: 06/07/2023 CLINICAL DATA:  Questionable sepsis EXAM: PORTABLE CHEST 1 VIEW COMPARISON:  Chest x-ray dated Jan 16, 2018 FINDINGS: The heart size and mediastinal contours are within normal limits. New mild opacity of the left costophrenic angle. Both lungs are otherwise clear. The visualized skeletal structures are unremarkable. IMPRESSION: New mild opacity of the left costophrenic angle, which may represent atelectasis, infection or aspiration can not be excluded. Recommend 6-8 week follow-up with PA and lateral chest radiograph to ensure resolution. Electronically Signed   By: Allegra Lai M.D.   On: 06/07/2023 17:17     Labs:   Basic Metabolic Panel: Recent Labs  Lab 06/08/23 0427 06/09/23 0523 06/10/23 0453 06/11/23 0726 06/12/23 1013 06/13/23 0521  NA 136 131* 133* 133* 136 135  K 4.3 4.0 3.6 3.4* 4.2 4.3  CL 105 100 102 103 105 100  CO2 18* 18* 19* 21* 21* 23  GLUCOSE 215* 198* 211* 211* 279* 158*  BUN 28* 32* 28* 23 15 13   CREATININE 1.25* 1.52* 1.40* 1.19* 1.10* 1.08*  CALCIUM 8.3* 8.3* 8.4* 8.5* 8.7* 9.0  MG 1.8 2.0 2.1 2.1  --  1.9  PHOS 2.5  --   --   --   --   --    GFR Estimated Creatinine Clearance: 73.3 mL/min (A) (by C-G formula based on SCr of 1.08 mg/dL (H)). Liver Function Tests: Recent Labs  Lab 06/07/23 1544 06/09/23 0523 06/10/23 0453 06/13/23 0521  AST 13* 16 13* 24  ALT 10 15 13 21   ALKPHOS 105 102 102 101  BILITOT 0.6 0.6 0.5 0.5  PROT 7.3  6.4* 6.3* 6.6  ALBUMIN 2.5* 1.9* 1.8* 1.9*   No results for input(s): "LIPASE", "AMYLASE" in the last 168 hours. No results for input(s): "AMMONIA" in the last 168 hours. Coagulation profile Recent Labs  Lab 06/07/23 1544 06/08/23 0427  INR 1.2 1.3*    CBC: Recent Labs  Lab 06/07/23 1544 06/08/23 0427 06/09/23 0523 06/10/23 0453  06/11/23 0726 06/12/23 1013 06/13/23 0521  WBC 17.7*   < > 19.4* 16.3* 12.7* 11.7* 10.3  NEUTROABS 15.5*  --   --   --   --   --   --   HGB 11.0*   < > 10.1* 9.5* 9.9* 9.9* 10.2*  HCT 34.5*   < > 31.0* 28.6* 30.6* 30.3* 31.5*  MCV 91.3   < > 88.6 87.2 86.4 86.1 88.0  PLT 123*   < > 160 224 178 214 335   < > = values in this interval not displayed.   Cardiac Enzymes: No results for input(s): "CKTOTAL", "CKMB", "CKMBINDEX", "TROPONINI" in the last 168 hours. BNP: Invalid input(s): "POCBNP" CBG: Recent Labs  Lab 06/13/23 0910 06/13/23 1140 06/13/23 1546 06/13/23 2109 06/14/23 0811  GLUCAP 234* 190* 127* 147* 194*   D-Dimer No results for input(s): "DDIMER" in the last 72 hours. Hgb A1c No results for input(s): "HGBA1C" in the last 72 hours. Lipid Profile No results for input(s): "CHOL", "HDL", "LDLCALC", "TRIG", "CHOLHDL", "LDLDIRECT" in the last 72 hours. Thyroid function studies No results for input(s): "TSH", "T4TOTAL", "T3FREE", "THYROIDAB" in the last 72 hours.  Invalid input(s): "FREET3" Anemia work up No results for input(s): "VITAMINB12", "FOLATE", "FERRITIN", "TIBC", "IRON", "RETICCTPCT" in the last 72 hours. Microbiology Recent Results (from the past 240 hour(s))  Blood Culture (routine x 2)     Status: None   Collection Time: 06/07/23  3:44 PM   Specimen: BLOOD  Result Value Ref Range Status   Specimen Description BLOOD RIGHT ANTECUBITAL  Final   Special Requests   Final    BOTTLES DRAWN AEROBIC AND ANAEROBIC Blood Culture results may not be optimal due to an excessive volume of blood received in culture bottles   Culture   Final    NO GROWTH 5 DAYS Performed at [facility], [address], [place], [place] [address]    Report Status 06/12/2023 FINAL  Final  Body fluid culture w Gram Stain     Status: None   Collection Time: 06/08/23  1:09 AM   Specimen: Abscess; Body Fluid  Result Value Ref Range Status   Specimen Description ABSCESS  Final    Special Requests NONE  Final   Gram Stain   Final    FEW WBC PRESENT, PREDOMINANTLY MONONUCLEAR MODERATE GRAM NEGATIVE RODS FEW GRAM POSITIVE COCCI IN CLUSTERS Performed at [facility], [address], [place], [place] [address]    Culture   Final    FEW STREPTOCOCCUS ANGINOSIS FEW ENTEROCOCCUS FAECALIS RARE CORYNEBACTERIUM AMYCOLATUM Standardized susceptibility testing for this organism is not available. RARE ESCHERICHIA COLI    Report Status 06/12/2023 FINAL  Final   Organism ID, Bacteria ENTEROCOCCUS FAECALIS  Final   Organism ID, Bacteria ESCHERICHIA COLI  Final      Susceptibility   Escherichia coli - MIC*    AMPICILLIN <=2 SENSITIVE Sensitive     CEFEPIME <=0.12 SENSITIVE Sensitive     CEFTAZIDIME <=1 SENSITIVE Sensitive     CEFTRIAXONE <=0.25 SENSITIVE Sensitive     CIPROFLOXACIN <=0.25 SENSITIVE Sensitive     GENTAMICIN <=1  SENSITIVE Sensitive     IMIPENEM <=0.25 SENSITIVE Sensitive     TRIMETH/SULFA <=20 SENSITIVE Sensitive     AMPICILLIN/SULBACTAM <=2 SENSITIVE Sensitive     PIP/TAZO <=4 SENSITIVE Sensitive ug/mL    * RARE ESCHERICHIA COLI   Enterococcus faecalis - MIC*    AMPICILLIN <=2 SENSITIVE Sensitive     VANCOMYCIN 1 SENSITIVE Sensitive     GENTAMICIN SYNERGY SENSITIVE Sensitive     * FEW ENTEROCOCCUS FAECALIS  Blood Culture (routine x 2)     Status: None   Collection Time: 06/08/23  4:24 AM   Specimen: BLOOD RIGHT HAND  Result Value Ref Range Status   Specimen Description BLOOD RIGHT HAND  Final   Special Requests   Final    BOTTLES DRAWN AEROBIC AND ANAEROBIC Blood Culture adequate volume   Culture   Final    NO GROWTH 5 DAYS Performed at [facility], [address] [place], [place] [address]    Report Status 06/13/2023 FINAL  Final  MRSA Next Gen by PCR, Nasal     Status: None   Collection Time: 06/08/23  4:20 PM   Specimen: Nasal Mucosa; Nasal Swab  Result Value Ref Range Status   MRSA by PCR Next Gen NOT DETECTED NOT DETECTED  Final    Comment: (NOTE) The GeneXpert MRSA Assay (FDA approved for NASAL specimens only), is one component of a comprehensive MRSA colonization surveillance program. It is not intended to diagnose MRSA infection nor to guide or monitor treatment for MRSA infections. Test performance is not FDA approved in patients less than 59 years old. Performed at [facility], [address] [place], [place] [address]     Time coordinating discharge: 45 minutes  Signed: Moreen Piggott  [facility] 06/14/2023, 1:12 PM

## 2023-06-14 NOTE — Progress Notes (Signed)
Patient ID: Terri Heath, female   DOB: 05/24/1956, 67 y.o.   MRN: [id number]           [facility] OB/GYN Attending Progress Note  ADMISSION DIAGNOSIS:   Principal Problem:   Labial abscess Active Problems:   Cellulitis of perineum   Hyperglycemia due to diabetes mellitus (HCC)   Subjective  She reports less pain and still has drainage   Objective  VITALS:  height is 5\' 7"  (1.702 m) and weight is 134.2 kg. Her oral temperature is 97.9 F (36.6 C). Her blood pressure is 129/69 and her pulse is 93. Her respiration is 18 and oxygen saturation is 97%.   EXAMINATION: CONSTITUTIONAL: Well-developed, well-nourished female in no acute distress.  HENT:  Normocephalic, atraumatic, External right and left ear normal. Oropharynx is clear and moist EYES: Conjunctivae and EOM are normal. Pupils are equal, round, and reactive to light. No scleral icterus.  NECK: Normal range of motion, supple, no masses SKIN: Skin is warm and dry. No rash noted. Not diaphoretic. No erythema. No pallor. NEUROLOGIC: Alert and oriented to person, place, and time. No cranial nerve deficit noted. PSYCHIATRIC: Normal mood and affect. Normal behavior. Normal judgment and thought content. CARDIOVASCULAR: Normal heart rate noted RESPIRATORY: Effort and breath sounds normal, no problems with respiration noted MUSCULOSKELETAL: Normal range of motion. No edema and no pain with movement GU: Vulva with moderate edema and erythema but patient states it is improving. Posterior left vulvar drainage  Performed in the presence of a chaperone  Laboratory Reports: Results for orders placed or performed during the hospital encounter of 06/07/23 (from the past 72 hour(s))  Glucose, capillary     Status: Abnormal   Collection Time: 06/11/23 11:42 AM  Result Value Ref Range   Glucose-Capillary 261 (H) 70 - 99 mg/dL    Comment: Glucose reference range applies only to samples taken after fasting for at least  8 hours.  Glucose, capillary     Status: Abnormal   Collection Time: 06/11/23  3:49 PM  Result Value Ref Range   Glucose-Capillary 162 (H) 70 - 99 mg/dL    Comment: Glucose reference range applies only to samples taken after fasting for at least 8 hours.  Vancomycin, trough     Status: Abnormal   Collection Time: 06/11/23  5:35 PM  Result Value Ref Range   Vancomycin Tr 13 (L) 15 - 20 ug/mL    Comment: Performed at [facility], [address], [place], [place] [address]  Glucose, capillary     Status: Abnormal   Collection Time: 06/11/23  8:59 PM  Result Value Ref Range   Glucose-Capillary 150 (H) 70 - 99 mg/dL    Comment: Glucose reference range applies only to samples taken after fasting for at least 8 hours.  Glucose, capillary     Status: Abnormal   Collection Time: 06/12/23  8:25 AM  Result Value Ref Range   Glucose-Capillary 260 (H) 70 - 99 mg/dL    Comment: Glucose reference range applies only to samples taken after fasting for at least 8 hours.  Basic metabolic panel     Status: Abnormal   Collection Time: 06/12/23 10:13 AM  Result Value Ref Range   Sodium 136 135 - 145 mmol/L   Potassium 4.2 3.5 - 5.1 mmol/L   Chloride 105 98 - 111 mmol/L   CO2 21 (L) 22 - 32 mmol/L   Glucose, Bld 279 (H) 70 - 99 mg/dL  Comment: Glucose reference range applies only to samples taken after fasting for at least 8 hours.   BUN 15 8 - 23 mg/dL   Creatinine, Ser 8.29 (H) 0.44 - 1.00 mg/dL   Calcium 8.7 (L) 8.9 - 10.3 mg/dL   GFR, Estimated 55 (L) >60 mL/min    Comment: (NOTE) Calculated using the CKD-EPI Creatinine Equation (2021)    Anion gap 10 5 - 15    Comment: Performed at [facility], [address], [place], [place] [address]  CBC     Status: Abnormal   Collection Time: 06/12/23 10:13 AM  Result Value Ref Range   WBC 11.7 (H) 4.0 - 10.5 K/uL   RBC 3.52 (L) 3.87 - 5.11 MIL/uL   Hemoglobin 9.9 (L) 12.0 - 15.0 g/dL   HCT 08.6 (L) 57.8 - 46.9 %   MCV 86.1 80.0 -  100.0 fL   MCH 28.1 26.0 - 34.0 pg   MCHC 32.7 30.0 - 36.0 g/dL   RDW 62.9 52.8 - 41.3 %   Platelets 214 150 - 400 K/uL   nRBC 0.0 0.0 - 0.2 %    Comment: Performed at [facility], [address], [place], [place] [address]  Glucose, capillary     Status: Abnormal   Collection Time: 06/12/23 12:02 PM  Result Value Ref Range   Glucose-Capillary 278 (H) 70 - 99 mg/dL    Comment: Glucose reference range applies only to samples taken after fasting for at least 8 hours.  Glucose, capillary     Status: Abnormal   Collection Time: 06/12/23  3:38 PM  Result Value Ref Range   Glucose-Capillary 121 (H) 70 - 99 mg/dL    Comment: Glucose reference range applies only to samples taken after fasting for at least 8 hours.  Glucose, capillary     Status: Abnormal   Collection Time: 06/12/23  9:25 PM  Result Value Ref Range   Glucose-Capillary 143 (H) 70 - 99 mg/dL    Comment: Glucose reference range applies only to samples taken after fasting for at least 8 hours.  Comprehensive metabolic panel     Status: Abnormal   Collection Time: 06/13/23  5:21 AM  Result Value Ref Range   Sodium 135 135 - 145 mmol/L   Potassium 4.3 3.5 - 5.1 mmol/L   Chloride 100 98 - 111 mmol/L   CO2 23 22 - 32 mmol/L   Glucose, Bld 158 (H) 70 - 99 mg/dL    Comment: Glucose reference range applies only to samples taken after fasting for at least 8 hours.   BUN 13 8 - 23 mg/dL   Creatinine, Ser 0.27 (H) 0.44 - 1.00 mg/dL   Calcium 9.0 8.9 - 25.3 mg/dL   Total Protein 6.6 6.5 - 8.1 g/dL   Albumin 1.9 (L) 3.5 - 5.0 g/dL   AST 24 15 - 41 U/L   ALT 21 0 - 44 U/L   Alkaline Phosphatase 101 38 - 126 U/L   Total Bilirubin 0.5 0.3 - 1.2 mg/dL   GFR, Estimated 57 (L) >60 mL/min    Comment: (NOTE) Calculated using the CKD-EPI Creatinine Equation (2021)    Anion gap 12 5 - 15    Comment: Performed at [facility], [address] [place], [place] [address]  CBC     Status: Abnormal   Collection Time: 06/13/23   5:21 AM  Result Value Ref Range   WBC 10.3 4.0 - 10.5 K/uL  RBC 3.58 (L) 3.87 - 5.11 MIL/uL   Hemoglobin 10.2 (L) 12.0 - 15.0 g/dL   HCT 55.7 (L) 32.2 - 02.5 %   MCV 88.0 80.0 - 100.0 fL   MCH 28.5 26.0 - 34.0 pg   MCHC 32.4 30.0 - 36.0 g/dL   RDW 42.7 06.2 - 37.6 %   Platelets 335 150 - 400 K/uL   nRBC 0.0 0.0 - 0.2 %    Comment: Performed at [facility], [address] [place], [place] [address]  Magnesium     Status: None   Collection Time: 06/13/23  5:21 AM  Result Value Ref Range   Magnesium 1.9 1.7 - 2.4 mg/dL    Comment: Performed at [facility], [address] [place], [place] [address]  Glucose, capillary     Status: Abnormal   Collection Time: 06/13/23  9:10 AM  Result Value Ref Range   Glucose-Capillary 234 (H) 70 - 99 mg/dL    Comment: Glucose reference range applies only to samples taken after fasting for at least 8 hours.  Glucose, capillary     Status: Abnormal   Collection Time: 06/13/23 11:40 AM  Result Value Ref Range   Glucose-Capillary 190 (H) 70 - 99 mg/dL    Comment: Glucose reference range applies only to samples taken after fasting for at least 8 hours.  Glucose, capillary     Status: Abnormal   Collection Time: 06/13/23  3:46 PM  Result Value Ref Range   Glucose-Capillary 127 (H) 70 - 99 mg/dL    Comment: Glucose reference range applies only to samples taken after fasting for at least 8 hours.  Glucose, capillary     Status: Abnormal   Collection Time: 06/13/23  9:09 PM  Result Value Ref Range   Glucose-Capillary 147 (H) 70 - 99 mg/dL    Comment: Glucose reference range applies only to samples taken after fasting for at least 8 hours.  Glucose, capillary     Status: Abnormal   Collection Time: 06/14/23  8:11 AM  Result Value Ref Range   Glucose-Capillary 194 (H) 70 - 99 mg/dL    Comment: Glucose reference range applies only to samples taken after fasting for at least 8 hours.   Radiology Reports: CT ABDOMEN PELVIS W  CONTRAST  Result Date: 06/13/2023 CLINICAL DATA:  Abscess of the perineum. EXAM: CT ABDOMEN AND PELVIS WITH CONTRAST TECHNIQUE: Multidetector CT imaging of the abdomen and pelvis was performed using the standard protocol following bolus administration of intravenous contrast. RADIATION DOSE REDUCTION: This exam was performed according to the departmental dose-optimization program which includes automated exposure control, adjustment of the mA and/or kV according to patient size and/or use of iterative reconstruction technique. CONTRAST:  75mL OMNIPAQUE IOHEXOL 350 MG/ML SOLN COMPARISON:  06/07/2023 FINDINGS: Lower chest: Dependent atelectasis noted in the lung bases. Hepatobiliary: No suspicious focal abnormality within the liver parenchyma. Small area of low attenuation in the anterior liver, adjacent to the falciform ligament, is in a characteristic location for focal fatty deposition. Gallbladder is distended. No intrahepatic or extrahepatic biliary dilation. Pancreas: No focal mass lesion. No dilatation of the main duct. No intraparenchymal cyst. No peripancreatic edema. Spleen: No splenomegaly. No suspicious focal mass lesion. Adrenals/Urinary Tract: No adrenal nodule or mass. Kidneys unremarkable. No evidence for hydroureter. The urinary bladder appears normal for the degree of distention. Stomach/Bowel: Stomach is unremarkable. No gastric wall thickening. No evidence of outlet obstruction. Duodenum is normally positioned as is the ligament of  Treitz. No small bowel wall thickening. No small bowel dilatation. The terminal ileum is normal. The appendix is not well visualized, but there is no edema or inflammation in the region of the cecal tip to suggest appendicitis. No gross colonic mass. No colonic wall thickening. Diverticular changes are noted in the left colon without evidence of diverticulitis. Vascular/Lymphatic: There is mild atherosclerotic calcification of the abdominal aorta without aneurysm.  There is no gastrohepatic or hepatoduodenal ligament lymphadenopathy. No retroperitoneal or mesenteric lymphadenopathy. No pelvic sidewall lymphadenopathy. Reproductive: Hysterectomy. There is no adnexal mass. There is some near confluent edema/inflammation in the left labium, likely with some associated skin thickening. Edema/inflammation tracks inferiorly into the left perineum and superiorly towards the left groin region. There is no discrete or rim enhancing fluid collection to suggest the presence of a drainable abscess. No soft tissue gas in the pelvic floor or perineum. Tiny gas bubbles are seen in the high intergluteal fold, nonpathologic as they are trapped between the adjacent/apposed skin surfaces. Other: No intraperitoneal free fluid. Musculoskeletal: Posttraumatic deformity noted in the right pubic bone and right pubic rami. No worrisome lytic or sclerotic osseous abnormality. IMPRESSION: 1. Near confluent edema/inflammation in the left labium, likely with some associated skin thickening. Edema/inflammation tracks inferiorly into the left perineum and superiorly towards the left groin region. No discrete or rim enhancing fluid collection to suggest the presence of a drainable abscess. No soft tissue gas in the pelvic floor, labium or perineum. 2. Left colonic diverticulosis without diverticulitis. 3.  Aortic Atherosclerosis (ICD10-I70.0). Electronically Signed   By: Kennith Center M.D.   On: 06/13/2023 06:04    I personally reviewed Labs and Imaging Studies under Results section.    ASSESSMENT/PLAN: Vulvar abscess  do not think I&D/drainage indicated -continue with antibiotics- per IM plan to move towards oral management -continue with current oral pain medication DVT Prophylaxis:  Ambulation yes Lovenox   Code Status: Full Disposition Plan: Discussed with pt possible discharge- she discussed concerns regarding ambulation/abscess care at home. Will review with primary team. Pt seen by Dr.  Hyacinth Meeker at [facility] and we will arrange outpt follow up from our end as well. Pt will likely need to be seen outpt by both GYN and IM   Time spent: 25 minutes   LOS: 7 days   Adam Phenix, MD Obstetrician & Gynecologist, [facility] for [facility], [facility]

## 2023-06-14 NOTE — Evaluation (Addendum)
Physical Therapy Evaluation Patient Details Name: Terri Heath MRN: [id number] DOB: [birth year] Today's Date: 06/14/2023  History of Present Illness  67 yo female admitted 10/10 with 3 abscesses in perineal area, septic. PMHx of recurrent labial abscess, DMII, morbid obesity, endometrial CA, CHF, CAD, PAD, MGUS, HTN, HLD  Clinical Impression  Pt pleasant and emotional at times with frustration with wound and lack of mobility due to pain. Pt able to transition to sitting and supine without assist with inability to tolerate sitting due to position of wound. Pt encouraged to continue frequent ambulation trials to gain strength and would benefit from HHPT to maximize stairs and strength. Pt with decreased mobility and function due to fatigue and pain who will benefit from acute therapy to maximize mobility and safety.         If plan is discharge home, recommend the following: Assistance with cooking/housework;Help with stairs or ramp for entrance;Assist for transportation   Can travel by private vehicle        Equipment Recommendations Bedside commode  Recommendations for Other Services       Functional Status Assessment Patient has had a recent decline in their functional status and demonstrates the ability to make significant improvements in function in a reasonable and predictable amount of time.     Precautions / Restrictions Precautions Precautions: Fall Precaution Comments: perineal wound      Mobility  Bed Mobility Overal bed mobility: Modified Independent             General bed mobility comments: reliance on rail with bed flat to transition supine<>sitting    Transfers Overall transfer level: Modified independent                 General transfer comment: increased time with pt rising from bed and recliner. unable to tolerate sitting more than a few minutes at a time even with pillow in chair. requested donut cushion from RN     Ambulation/Gait Ambulation/Gait assistance: Supervision Gait Distance (Feet): 200 Feet Assistive device: Rolling walker (2 wheels) Gait Pattern/deviations: Step-through pattern, Decreased stride length, Trunk flexed   Gait velocity interpretation: 1.31 - 2.62 ft/sec, indicative of limited community ambulator   General Gait Details: pt with flexed trunk and reliance on UB support, cues for posture, limited by fatigue  Stairs Stairs: Yes Stairs assistance: Min assist Stair Management: Step to pattern, Forwards, One rail Left, With cane Number of Stairs: 2 General stair comments: pt able to ascend 2 steps forward, descend backward with rail and cane with min assist to stabilize. Pt fatigued after 2 steps and states her stairs are 6" height not 8" and she cannot perform repeated taller stairs. Pt able to partially ascend 1 step backward with RW but unable to continue  Wheelchair Mobility     Tilt Bed    Modified Rankin (Stroke Patients Only)       Balance Overall balance assessment: History of Falls                                           Pertinent Vitals/Pain Pain Assessment Pain Assessment: 0-10 Pain Score: 5  Pain Location: perineal area Pain Descriptors / Indicators: Grimacing, Guarding, Discomfort Pain Intervention(s): Limited activity within patient's tolerance, Repositioned    Home Living Family/patient expects to be discharged to:: Private residence Living Arrangements: Spouse/significant other Available Help at Discharge: Family;Available PRN/intermittently Type of  Home: House Home Access: Stairs to enter Entrance Stairs-Rails: Doctor, general practice of Steps: 5   Home Layout: One level Home Equipment: Agricultural consultant (2 wheels);Rollator (4 wheels);Cane - single point;Shower seat - built in      Prior Function Prior Level of Function : Independent/Modified Independent;History of Falls (last six months)              Mobility Comments: 3-4 falls in last 6 months. Uses cane at baseline ADLs Comments: enjoys going to the pool 3x/wk for exercise     Extremity/Trunk Assessment   Upper Extremity Assessment Upper Extremity Assessment: Generalized weakness    Lower Extremity Assessment Lower Extremity Assessment: Generalized weakness    Cervical / Trunk Assessment Cervical / Trunk Assessment: Kyphotic Cervical / Trunk Exceptions: rounded shoulders  Communication   Communication Communication: No apparent difficulties  Cognition Arousal: Alert Behavior During Therapy: Flat affect Overall Cognitive Status: Within Functional Limits for tasks assessed                                          General Comments      Exercises     Assessment/Plan    PT Assessment Patient needs continued PT services  PT Problem List Decreased strength;Decreased mobility;Decreased activity tolerance;Decreased knowledge of use of DME       PT Treatment Interventions DME instruction;Gait training;Stair training;Functional mobility training;Therapeutic activities;Patient/family education;Balance training    PT Goals (Current goals can be found in the Care Plan section)  Acute Rehab PT Goals Patient Stated Goal: return to the pool, get stronger PT Goal Formulation: With patient Time For Goal Achievement: 06/28/23 Potential to Achieve Goals: Good    Frequency Min 1X/week     Co-evaluation               AM-PAC PT "6 Clicks" Mobility  Outcome Measure Help needed turning from your back to your side while in a flat bed without using bedrails?: None Help needed moving from lying on your back to sitting on the side of a flat bed without using bedrails?: A Little Help needed moving to and from a bed to a chair (including a wheelchair)?: A Little Help needed standing up from a chair using your arms (e.g., wheelchair or bedside chair)?: None Help needed to walk in hospital room?: None Help  needed climbing 3-5 steps with a railing? : A Little 6 Click Score: 21    End of Session   Activity Tolerance: Patient tolerated treatment well;Patient limited by fatigue Patient left: in bed;with call bell/phone within reach Nurse Communication: Mobility status PT Visit Diagnosis: Other abnormalities of gait and mobility (R26.89);History of falling (Z91.81);Muscle weakness (generalized) (M62.81)    Time: 1010-1044 PT Time Calculation (min) (ACUTE ONLY): 34 min   Charges:   PT Evaluation $PT Eval Moderate Complexity: 1 Mod PT Treatments $Therapeutic Activity: 8-22 mins PT General Charges $$ ACUTE PT VISIT: 1 Visit         Merryl Hacker, PT Acute Rehabilitation Services Office: [phone]   Enedina Finner Ikechukwu Cerny 06/14/2023, 10:53 AM

## 2023-06-14 NOTE — Progress Notes (Signed)
Transition of Care [facility]) - Inpatient Brief Assessment   Patient Details  Name: PAMULA LUTHER MRN: [id number] Date of Birth: 1955/10/31  [facility] of [facility]) CM/SW Contact:    Janae Bridgeman, RN Phone Number: 06/14/2023, 2:27 PM   Clinical Narrative: Patient admitted to the hospital with labial abscess.  The patient plans to discharge home today by car.  I met with the patient and offered Medicare choice regarding home health and DME services and the patient did not have a preference.  Patient's preferred [employer].  I called and requested 3:1 be shipped to the home.  DME order and DME note placed and co-signed by the MD.  I called Kandee Keen, RNCM with [facility] and he accepted for home health services.  Bedside RN will discharge the patient home and is aware that [facility] services are set up.  No other TOC needs.   Transition of Care Asessment: Insurance and Status: (P) Insurance coverage has been reviewed Patient has primary care physician: (P) Yes Home environment has been reviewed: (P) From home with spouse Prior level of function:: (P) Independent Prior/Current Home Services: (P) No current home services Social Determinants of Health Reivew: (P) SDOH reviewed interventions complete Readmission risk has been reviewed: (P) Yes Transition of care needs: (P) transition of care needs identified, TOC will continue to follow

## 2023-06-14 NOTE — Progress Notes (Signed)
    Durable Medical Equipment  (From admission, onward)           Start     Ordered   06/14/23 1416  For home use only DME Bedside commode  Once       Comments: Patient needs 3:1 so that she can toilet in the bedroom next to her bed due to physical deconditioning and generalized weakness.  Question:  Patient needs a bedside commode to treat with the following condition  Answer:  Physical deconditioning   06/14/23 1417

## 2023-06-15 ENCOUNTER — Telehealth: Payer: Self-pay

## 2023-06-15 ENCOUNTER — Other Ambulatory Visit (HOSPITAL_BASED_OUTPATIENT_CLINIC_OR_DEPARTMENT_OTHER): Payer: Self-pay

## 2023-06-15 NOTE — Transitions of Care (Post Inpatient/ED Visit) (Signed)
06/15/2023  Name: Terri Heath MRN: [id number] DOB: Aug 11, 1956  Today's TOC FU Call Status: Today's TOC FU Call Status:: Successful TOC FU Call Completed TOC FU Call Complete Date: 06/15/23 Patient's Name and Date of Birth confirmed.  Transition Care Management Follow-up Telephone Call Date of Discharge: 06/14/23 Discharge Facility: [facility]) Type of Discharge: Inpatient Admission Primary Inpatient Discharge Diagnosis:: cellulitis How have you been since you were released from the hospital?: Better Any questions or concerns?: No  Items Reviewed: Did you receive and understand the discharge instructions provided?: Yes Medications obtained,verified, and reconciled?: Yes (Medications Reviewed) Any new allergies since your discharge?: No Dietary orders reviewed?: Yes Type of Diet Ordered:: carb modified Do you have support at home?: Yes People in Home: spouse Name of Support/Comfort Primary Source: husband  Medications Reviewed Today: Medications Reviewed Today     Reviewed by Earlie Server, RN (Registered Nurse) on 06/15/23 at 1132  Med List Status: <None>   Medication Order Taking? Sig Documenting Provider Last Dose Status Informant  acetaminophen (TYLENOL) 500 MG tablet [id number] Yes Take 500-1,000 mg by mouth every 6 (six) hours as needed for mild pain or moderate pain. [provider] Taking Active Self, Pharmacy Records  albuterol [facility] HFA) 108 (90 Base) MCG/ACT inhaler [id number] Yes Inhale 2 puffs into the lungs every 6 (six) hours as needed. wheezing Oretha Milch, MD Taking Active Self, Pharmacy Records           Med Note Sonia Baller Sep 12, 2022  4:11 PM) prn  amoxicillin-clavulanate (AUGMENTIN) 875-125 MG tablet [id number] Yes Take 1 tablet by mouth every 12 (twelve) hours for 5 days. Lorin Glass, MD Taking Active   aspirin 81 MG chewable tablet [id number] Yes Chew 1 tablet (81 mg total) by mouth daily. Laurey Morale, MD Taking  Active Self, Pharmacy Records  blood glucose meter kit and supplies [id number] Yes Use up to four times daily as directed. (FOR ICD-10 E10.9, E11.9). Tollie Eth, NP Taking Active Self, Pharmacy Records  carvedilol (COREG) 6.25 MG tablet [id number] Yes Take 1 tablet (6.25 mg total) by mouth 2 (two) times daily. Tollie Eth, NP Taking Active Self, Pharmacy Records  Evolocumab [facility] SURECLICK) 140 MG/ML Ivory Broad [id number] Yes Inject 140 mg into the skin every 14 (fourteen) days. Laurey Morale, MD Taking Active Self, Pharmacy Records           Med Note Faylene Kurtz Jun 07, 2023  9:19 PM) Very other Thursday  furosemide (LASIX) 20 MG tablet [id number] Yes Take 1 tablet (20 mg total) by mouth daily. Tollie Eth, NP Taking Active Self, Pharmacy Records  glucose blood test strip [id number] Yes Use as directed to test blood sugar Early, Sung Amabile, NP Taking Active Self, Pharmacy Records    Discontinued 10/26/20 1434   insulin glargine (LANTUS) 100 UNIT/ML Solostar Pen [id number] Yes Inject 25-40 Units into the skin daily. Tollie Eth, NP Taking Active Self, Pharmacy Records  levothyroxine (SYNTHROID) 50 MCG tablet [id number] Yes Take 1 tablet (50 mcg total) by mouth daily, before a meal. Early, Sung Amabile, NP Taking Active Self, Pharmacy Records  linezolid (ZYVOX) 600 MG tablet [id number] Yes Take 1 tablet (600 mg total) by mouth 2 (two) times daily for 5 days. Lorin Glass, MD Taking Active   magnesium oxide (MAG-OX) 400 (240 Mg) MG tablet [id number] Yes Take 1 tablet (400 mg total) by mouth daily. Artis Delay, MD Taking Active  Self, Pharmacy Records  metFORMIN (GLUCOPHAGE) 1000 MG tablet [id number] Yes Take 1 tablet (1,000 mg total) by mouth 2 (two) times daily with a meal. Early, Sung Amabile, NP Taking Active Self, Pharmacy Records  Multiple Vitamin (MULITIVITAMIN WITH MINERALS) TABS [id number] Yes Take 1 tablet by mouth daily with breakfast. [provider] Taking Active Self, Pharmacy  Records  naproxen sodium (ALEVE) 220 MG tablet [id number] Yes Take 440 mg by mouth daily as needed (pain). [provider] Taking Active Self, Pharmacy Records  [facility] Lancets 33G [place] [id number] Yes use as directed Early, Sung Amabile, NP Taking Active Self, Pharmacy Records  saccharomyces boulardii (FLORASTOR) 250 MG capsule [id number] Yes Take 1 capsule (250 mg total) by mouth 2 (two) times daily for 5 days. Lorin Glass, MD Taking Active   sacubitril-valsartan (ENTRESTO) 24-26 MG [id number] Yes Take 1 tablet by mouth 2 (two) times daily. Tollie Eth, NP Taking Active Self, Pharmacy Records  spironolactone (ALDACTONE) 25 MG tablet [id number] Yes Take 1 tablet (25 mg total) by mouth daily. Tollie Eth, NP Taking Active Self, Pharmacy Records  tirzepatide [facility]) 2.5 MG/0.5ML Pen [id number] Yes Inject 2.5 mg into the skin once a week. Tollie Eth, NP Taking Active Self, Pharmacy Records           Med Note Faylene Kurtz Jun 07, 2023  9:18 PM) Typically on Thursday but is costly  traMADol (ULTRAM) 50 MG tablet [id number] Yes Take 1 tablet (50 mg total) by mouth every 6 (six) hours as needed for up to 5 days for severe pain (pain score 7-10) or moderate pain (pain score 4-6). Lorin Glass, MD Taking Active   triamcinolone ointment (KENALOG) 0.1 % [id number] Yes Apply 1 application topically two times daily to affected area(s) as needed, sparing use to avoid whitening/thinning skin Early, Sung Amabile, NP Taking Active Self, Pharmacy Records           Med Note ([place], KIMBERLY B   Tue Nov 07, 2022 11:55 AM)              Home Care and Equipment/Supplies: Were Home Health Services Ordered?: Yes [facility] of [facility] Health Agency:: [facility] Has Agency set up a time to come to your home?: No (It has been less than 24 hours and patient knows to call [facility] if no response in another 24 hours. [facility] number for homehealth agency provided.) EMR reviewed for Home Health Orders: Orders  present/patient has not received call (refer to CM for follow-up) Any new equipment or medical supplies ordered?: Yes Name of Medical supply agency?: [employer] to deliver bedside commode.  Provided patient follow up phone number if equipment does not arrive. Were you able to get the equipment/medical supplies?: No Do you have any questions related to the use of the equipment/supplies?: No  Functional Questionnaire: Do you need assistance with bathing/showering or dressing?: No Do you need assistance with meal preparation?: Yes Do you need assistance with eating?: No Do you have difficulty maintaining continence: No Do you need assistance with getting out of bed/getting out of a chair/moving?: No Do you have difficulty managing or taking your medications?: No  Follow up appointments reviewed: PCP Follow-up appointment confirmed?: No MD Provider Line Number:[phone] Given: No Specialist Hospital Follow-up appointment confirmed?: No Reason Specialist Follow-Up Not Confirmed: Patient has Specialist Provider Number and will Call for Appointment Do you need transportation to your follow-up appointment?: No Do you understand care options if your condition(s) worsen?: Yes-patient verbalized understanding  SDOH Interventions Today    Flowsheet Row Most Recent Value  SDOH Interventions   Food Insecurity Interventions Intervention Not Indicated  Transportation Interventions Intervention Not Indicated      Patient reports that she is doing well. Reports that she continues to have drainage from abscess.  Reports she is taking her medications as prescribed, States she has her antibiotics.  States CBG today of 140.  Reports good support at home. Interventions:  *Reviewed discharge instructions. *offered to make PCP follow up and patient wants to do this herself. Reviewed specialist follow up. *reviewed DM diet and importance of good DM control.  *reviewed importance of taking all medications as  prescribed. *reviewed importance of calling MD for any questions or concerns. Reviewed that someone is on call all the time. *Reviewed and update allergy to oxy *offered 30 day program and patient declined.    Lonia Chimera, RN, BSN, CEN [facility] [employer] [phone]

## 2023-06-16 NOTE — Plan of Care (Signed)
[facility] Tonsillectomy/Adenoidectomy, Postoperative PEDS care plan entered in error.

## 2023-06-18 ENCOUNTER — Telehealth: Payer: Self-pay | Admitting: Nurse Practitioner

## 2023-06-18 ENCOUNTER — Other Ambulatory Visit (HOSPITAL_BASED_OUTPATIENT_CLINIC_OR_DEPARTMENT_OTHER): Payer: Self-pay

## 2023-06-18 ENCOUNTER — Encounter (HOSPITAL_BASED_OUTPATIENT_CLINIC_OR_DEPARTMENT_OTHER): Payer: Self-pay | Admitting: Obstetrics & Gynecology

## 2023-06-18 ENCOUNTER — Ambulatory Visit (INDEPENDENT_AMBULATORY_CARE_PROVIDER_SITE_OTHER): Payer: Medicare HMO | Admitting: Obstetrics & Gynecology

## 2023-06-18 VITALS — BP 117/70 | HR 106 | Ht 67.0 in | Wt 286.0 lb

## 2023-06-18 DIAGNOSIS — N764 Abscess of vulva: Secondary | ICD-10-CM | POA: Diagnosis not present

## 2023-06-18 DIAGNOSIS — Z8542 Personal history of malignant neoplasm of other parts of uterus: Secondary | ICD-10-CM

## 2023-06-18 DIAGNOSIS — E1159 Type 2 diabetes mellitus with other circulatory complications: Secondary | ICD-10-CM | POA: Diagnosis not present

## 2023-06-18 DIAGNOSIS — E1165 Type 2 diabetes mellitus with hyperglycemia: Secondary | ICD-10-CM | POA: Diagnosis not present

## 2023-06-18 MED ORDER — AMOXICILLIN-POT CLAVULANATE 875-125 MG PO TABS
1.0000 | ORAL_TABLET | Freq: Two times a day (BID) | ORAL | 0 refills | Status: AC
  Filled 2023-06-18 (×2): qty 14, 7d supply, fill #0

## 2023-06-18 NOTE — Telephone Encounter (Signed)
[facility]  [phone]  Wants ok for PT   2 x week 2 weeks  And 1 x week for 4 weeks

## 2023-06-19 ENCOUNTER — Other Ambulatory Visit: Payer: Self-pay

## 2023-06-19 ENCOUNTER — Other Ambulatory Visit (HOSPITAL_BASED_OUTPATIENT_CLINIC_OR_DEPARTMENT_OTHER): Payer: Self-pay

## 2023-06-19 MED ORDER — LINEZOLID 600 MG PO TABS
600.0000 mg | ORAL_TABLET | Freq: Two times a day (BID) | ORAL | 0 refills | Status: AC
Start: 2023-06-19 — End: 2023-06-25
  Filled 2023-06-19: qty 10, 5d supply, fill #0

## 2023-06-19 MED ORDER — LIDOCAINE HCL URETHRAL/MUCOSAL 2 % EX GEL
CUTANEOUS | 1 refills | Status: DC
  Filled 2023-06-19: qty 30, 30d supply, fill #0

## 2023-06-19 NOTE — Progress Notes (Signed)
GYNECOLOGY  VISIT  CC:   hospital follow up  HPI: 67 y.o. G3P3 Married White or Caucasian female here for follow up after being hospitalized for large left labial abscess from 10/10 to 10/17.  She was treated with IV antibiotics and switched to Augemtin and Zyvox orally.  Gyn consult done by Dr. Despina Hidden.  I&D not recommended as abscess was draining.  She still has another day or two of oral abx left.  Denies fever.  Area is still tender but improved.  Does still have firmness of labia on left.    Pt does have hx of diabetes and hb A1c was 9.3 05/03/2023.     Past Medical History:  Diagnosis Date   Abnormal vaginal bleeding in postmenopausal patient 10/11/2021   Acute systolic heart failure (HCC)    Allergy    Asthma    Bilateral shoulder pain    Borderline hypertension    Bronchitis    CAD (coronary artery disease)    Chronic systolic CHF (congestive heart failure) (HCC)    Chronic systolic congestive heart failure, NYHA class 3 (HCC) 07/24/2018   Closed fracture of distal fibula 07/17/2022   Dehydration 12/20/2021   Diabetes mellitus    Diagnosed in 2000, on insulin, Trulicity and metformin, not checking cgs at home   Drug-induced hypotension 12/20/2021   Elevated serum creatinine 12/20/2021   Gangrenous appendicitis with perforation s/p lap appendectomy 10/04/2017 10/04/2017   GERD (gastroesophageal reflux disease)    History of MI (myocardial infarction)    History of radiation therapy    Uterus- 12/14/21-02/01/22- Dr. Antony Blackbird   Hyperlipidemia    Hypertension    Hypothyroidism    MGUS (monoclonal gammopathy of unknown significance)    Mitral regurgitation    Myocardial infarction (HCC)    Neuropathy    Obesity    Rash of back 01/06/2021   Sleep apnea    uses CPAP   Statin intolerance    Uterine cancer (HCC)     MEDS:   Current Outpatient Medications on File Prior to Visit  Medication Sig Dispense Refill   acetaminophen (TYLENOL) 500 MG tablet Take 500-1,000 mg by  mouth every 6 (six) hours as needed for mild pain or moderate pain.     albuterol (PROAIR HFA) 108 (90 Base) MCG/ACT inhaler Inhale 2 puffs into the lungs every 6 (six) hours as needed. wheezing 18 g 2   aspirin 81 MG chewable tablet Chew 1 tablet (81 mg total) by mouth daily. 90 tablet 3   blood glucose meter kit and supplies Use up to four times daily as directed. (FOR ICD-10 E10.9, E11.9). 1 each 99   carvedilol (COREG) 6.25 MG tablet Take 1 tablet (6.25 mg total) by mouth 2 (two) times daily. 180 tablet 1   Evolocumab (REPATHA SURECLICK) 140 MG/ML SOAJ Inject 140 mg into the skin every 14 (fourteen) days. 6 mL 3   furosemide (LASIX) 20 MG tablet Take 1 tablet (20 mg total) by mouth daily. 90 tablet 1   glucose blood test strip Use as directed to test blood sugar 100 each PRN   insulin glargine (LANTUS) 100 UNIT/ML Solostar Pen Inject 25-40 Units into the skin daily. 45 mL 3   levothyroxine (SYNTHROID) 50 MCG tablet Take 1 tablet (50 mcg total) by mouth daily, before a meal. 90 tablet 1   linezolid (ZYVOX) 600 MG tablet Take 1 tablet (600 mg total) by mouth 2 (two) times daily for 5 days. 10 tablet 0   magnesium  oxide (MAG-OX) 400 (240 Mg) MG tablet Take 1 tablet (400 mg total) by mouth daily. 30 tablet 0   metFORMIN (GLUCOPHAGE) 1000 MG tablet Take 1 tablet (1,000 mg total) by mouth 2 (two) times daily with a meal. 180 tablet 1   Multiple Vitamin (MULITIVITAMIN WITH MINERALS) TABS Take 1 tablet by mouth daily with breakfast.     naproxen sodium (ALEVE) 220 MG tablet Take 440 mg by mouth daily as needed (pain).     OneTouch Delica Lancets 33G MISC use as directed 100 each 1   saccharomyces boulardii (FLORASTOR) 250 MG capsule Take 1 capsule (250 mg total) by mouth 2 (two) times daily for 5 days. 50 capsule 0   sacubitril-valsartan (ENTRESTO) 24-26 MG Take 1 tablet by mouth 2 (two) times daily. 180 tablet 1   spironolactone (ALDACTONE) 25 MG tablet Take 1 tablet (25 mg total) by mouth daily. 90  tablet 1   tirzepatide (MOUNJARO) 2.5 MG/0.5ML Pen Inject 2.5 mg into the skin once a week. 2 mL 0   traMADol (ULTRAM) 50 MG tablet Take 1 tablet (50 mg total) by mouth every 6 (six) hours as needed for up to 5 days for severe pain (pain score 7-10) or moderate pain (pain score 4-6). 20 tablet 0   triamcinolone ointment (KENALOG) 0.1 % Apply 1 application topically two times daily to affected area(s) as needed, sparing use to avoid whitening/thinning skin 30 g 1   [DISCONTINUED] insulin detemir (LEVEMIR) 100 UNIT/ML FlexPen Inject 10 Units into the skin daily. 15 mL 11   No current facility-administered medications on file prior to visit.    ALLERGIES: Oxycodone hcl, Amoxicillin, Adhesive [tape], Exenatide, Invokana [canagliflozin], Jardiance [empagliflozin], Lisinopril, Other, and Statins  SH:  married, non smoker  Review of Systems  Constitutional: Negative.   Genitourinary:        Vulvar tenderness, drainage    PHYSICAL EXAMINATION:    BP 117/70 (BP Location: Right Arm, Patient Position: Sitting, Cuff Size: Large)   Pulse (!) 106   Ht 5\' 7"  (1.702 m) Comment: Reported  Wt 286 lb (129.7 kg)   BMI 44.79 kg/m     Physical Exam Constitutional:      Appearance: Normal appearance.  Genitourinary:   Lymphadenopathy:     Lower Body: No right inguinal adenopathy. No left inguinal adenopathy.  Neurological:     General: No focal deficit present.     Mental Status: She is alert.  Psychiatric:        Mood and Affect: Mood normal.     Chaperone, Ina Homes, CMA, was present for exam.  Assessment/Plan: 1. Left genital labial abscess - pt has been using topical lidocaine to help with tenderness.  New rx to pharmacy. - will continue abx for 1 more week - repeat wound culture obtained, WOUND CULTURE - recheck 1 week  2. History of endometrial cancer - followed by gyn/oncology  3. Poorly controlled type 2 diabetes mellitus with circulatory disorder (HCC) - will reach out to  PCP to see if pt can be seen sooner  4. Obesity, Class III, BMI 40-49.9 (morbid obesity) (HCC)

## 2023-06-20 ENCOUNTER — Other Ambulatory Visit (HOSPITAL_COMMUNITY): Payer: Self-pay

## 2023-06-20 ENCOUNTER — Other Ambulatory Visit (HOSPITAL_BASED_OUTPATIENT_CLINIC_OR_DEPARTMENT_OTHER): Payer: Self-pay

## 2023-06-20 ENCOUNTER — Other Ambulatory Visit (HOSPITAL_BASED_OUTPATIENT_CLINIC_OR_DEPARTMENT_OTHER): Payer: Self-pay | Admitting: Obstetrics & Gynecology

## 2023-06-20 ENCOUNTER — Other Ambulatory Visit: Payer: Self-pay

## 2023-06-20 DIAGNOSIS — N764 Abscess of vulva: Secondary | ICD-10-CM

## 2023-06-20 MED ORDER — LIDOCAINE HCL URETHRAL/MUCOSAL 2 % EX GEL
CUTANEOUS | 1 refills | Status: DC
  Filled 2023-06-20: qty 30, 10d supply, fill #0

## 2023-06-20 MED ORDER — LIDOCAINE HCL URETHRAL/MUCOSAL 2 % EX GEL
1.0000 | Freq: Three times a day (TID) | CUTANEOUS | 1 refills | Status: DC | PRN
  Filled 2023-06-20: qty 30, 30d supply, fill #0
  Filled 2023-06-20: qty 30, 10d supply, fill #0

## 2023-06-21 ENCOUNTER — Other Ambulatory Visit (HOSPITAL_BASED_OUTPATIENT_CLINIC_OR_DEPARTMENT_OTHER): Payer: Self-pay

## 2023-06-21 ENCOUNTER — Other Ambulatory Visit: Payer: Self-pay

## 2023-06-21 LAB — WOUND CULTURE: Organism ID, Bacteria: NONE SEEN

## 2023-06-22 ENCOUNTER — Other Ambulatory Visit (HOSPITAL_BASED_OUTPATIENT_CLINIC_OR_DEPARTMENT_OTHER): Payer: Self-pay

## 2023-06-26 ENCOUNTER — Ambulatory Visit (HOSPITAL_BASED_OUTPATIENT_CLINIC_OR_DEPARTMENT_OTHER): Payer: Medicare HMO | Admitting: Obstetrics & Gynecology

## 2023-06-26 ENCOUNTER — Other Ambulatory Visit (HOSPITAL_BASED_OUTPATIENT_CLINIC_OR_DEPARTMENT_OTHER): Payer: Self-pay

## 2023-06-26 ENCOUNTER — Other Ambulatory Visit (HOSPITAL_COMMUNITY)
Admission: RE | Admit: 2023-06-26 | Discharge: 2023-06-26 | Disposition: A | Discharge status: Home / Self Care | Payer: Medicare HMO | Source: Ambulatory Visit | Attending: Obstetrics & Gynecology | Admitting: Obstetrics & Gynecology

## 2023-06-26 ENCOUNTER — Encounter (HOSPITAL_BASED_OUTPATIENT_CLINIC_OR_DEPARTMENT_OTHER): Payer: Self-pay | Admitting: Obstetrics & Gynecology

## 2023-06-26 VITALS — BP 94/63 | HR 107 | Wt 278.8 lb

## 2023-06-26 DIAGNOSIS — E1159 Type 2 diabetes mellitus with other circulatory complications: Secondary | ICD-10-CM | POA: Diagnosis not present

## 2023-06-26 DIAGNOSIS — N898 Other specified noninflammatory disorders of vagina: Secondary | ICD-10-CM | POA: Insufficient documentation

## 2023-06-26 DIAGNOSIS — E1165 Type 2 diabetes mellitus with hyperglycemia: Secondary | ICD-10-CM | POA: Diagnosis not present

## 2023-06-26 DIAGNOSIS — B3731 Acute candidiasis of vulva and vagina: Secondary | ICD-10-CM | POA: Diagnosis not present

## 2023-06-26 DIAGNOSIS — N764 Abscess of vulva: Secondary | ICD-10-CM

## 2023-06-26 NOTE — Progress Notes (Signed)
GYNECOLOGY  VISIT  CC:   vulvar abscess recheck  HPI: 67 y.o. G3P3 Married White or Caucasian female here for recheck of vulvar abscess.  She reports her husband ordered a bidet and she feels this has really helped.  Seeing less drainage on pad.  Wound culture obtained at last visit was negative.  Almost finished with antibiotics.  Feels less tender and that there is less firmness as well.  Discussed with pt improved management of her diabetes would be very helpful in preventing this from happening again.  Advised I've reached out to her PCP to get her in sooner than appt is currently scheduled.  She voiced understanding.     Past Medical History:  Diagnosis Date   Abnormal vaginal bleeding in postmenopausal patient 10/11/2021   Acute systolic heart failure (HCC)    Allergy    Asthma    Bilateral shoulder pain    Borderline hypertension    Bronchitis    CAD (coronary artery disease)    Chronic systolic CHF (congestive heart failure) (HCC)    Chronic systolic congestive heart failure, NYHA class 3 (HCC) 07/24/2018   Closed fracture of distal fibula 07/17/2022   Dehydration 12/20/2021   Diabetes mellitus    Diagnosed in 2000, on insulin, Trulicity and metformin, not checking cgs at home   Drug-induced hypotension 12/20/2021   Elevated serum creatinine 12/20/2021   Gangrenous appendicitis with perforation s/p lap appendectomy 10/04/2017 10/04/2017   GERD (gastroesophageal reflux disease)    History of MI (myocardial infarction)    History of radiation therapy    Uterus- 12/14/21-02/01/22- Dr. Antony Blackbird   Hyperlipidemia    Hypertension    Hypothyroidism    MGUS (monoclonal gammopathy of unknown significance)    Mitral regurgitation    Myocardial infarction (HCC)    Neuropathy    Obesity    Rash of back 01/06/2021   Sleep apnea    uses CPAP   Statin intolerance    Uterine cancer (HCC)     MEDS:   Current Outpatient Medications on File Prior to Visit  Medication Sig  Dispense Refill   acetaminophen (TYLENOL) 500 MG tablet Take 500-1,000 mg by mouth every 6 (six) hours as needed for mild pain or moderate pain.     albuterol (PROAIR HFA) 108 (90 Base) MCG/ACT inhaler Inhale 2 puffs into the lungs every 6 (six) hours as needed. wheezing 18 g 2   aspirin 81 MG chewable tablet Chew 1 tablet (81 mg total) by mouth daily. 90 tablet 3   blood glucose meter kit and supplies Use up to four times daily as directed. (FOR ICD-10 E10.9, E11.9). 1 each 99   carvedilol (COREG) 6.25 MG tablet Take 1 tablet (6.25 mg total) by mouth 2 (two) times daily. 180 tablet 1   Evolocumab (REPATHA SURECLICK) 140 MG/ML SOAJ Inject 140 mg into the skin every 14 (fourteen) days. 6 mL 3   furosemide (LASIX) 20 MG tablet Take 1 tablet (20 mg total) by mouth daily. 90 tablet 1   glucose blood test strip Use as directed to test blood sugar 100 each PRN   insulin glargine (LANTUS) 100 UNIT/ML Solostar Pen Inject 25-40 Units into the skin daily. 45 mL 3   levothyroxine (SYNTHROID) 50 MCG tablet Take 1 tablet (50 mcg total) by mouth daily, before a meal. 90 tablet 1   lidocaine (XYLOCAINE) 2 % jelly Apply 1 Application to tender areas topically 3 (three) times daily as needed. 30 mL 1  magnesium oxide (MAG-OX) 400 (240 Mg) MG tablet Take 1 tablet (400 mg total) by mouth daily. 30 tablet 0   metFORMIN (GLUCOPHAGE) 1000 MG tablet Take 1 tablet (1,000 mg total) by mouth 2 (two) times daily with a meal. 180 tablet 1   Multiple Vitamin (MULITIVITAMIN WITH MINERALS) TABS Take 1 tablet by mouth daily with breakfast.     naproxen sodium (ALEVE) 220 MG tablet Take 440 mg by mouth daily as needed (pain).     OneTouch Delica Lancets 33G MISC use as directed 100 each 1   saccharomyces boulardii (FLORASTOR) 250 MG capsule Take 1 capsule (250 mg total) by mouth 2 (two) times daily for 5 days. 50 capsule 0   sacubitril-valsartan (ENTRESTO) 24-26 MG Take 1 tablet by mouth 2 (two) times daily. 180 tablet 1    spironolactone (ALDACTONE) 25 MG tablet Take 1 tablet (25 mg total) by mouth daily. 90 tablet 1   tirzepatide (MOUNJARO) 2.5 MG/0.5ML Pen Inject 2.5 mg into the skin once a week. 2 mL 0   triamcinolone ointment (KENALOG) 0.1 % Apply 1 application topically two times daily to affected area(s) as needed, sparing use to avoid whitening/thinning skin 30 g 1   [DISCONTINUED] insulin detemir (LEVEMIR) 100 UNIT/ML FlexPen Inject 10 Units into the skin daily. 15 mL 11   No current facility-administered medications on file prior to visit.    ALLERGIES: Oxycodone hcl, Amoxicillin, Adhesive [tape], Exenatide, Invokana [canagliflozin], Jardiance [empagliflozin], Lisinopril, Other, and Statins  SH:  married, non smoker  Review of Systems  Constitutional: Negative.   Genitourinary:        Decreased vulvar drainage    PHYSICAL EXAMINATION:    BP 94/63 (BP Location: Right Arm, Patient Position: Sitting, Cuff Size: Large)   Pulse (!) 107   Wt 278 lb 12.8 oz (126.5 kg)   BMI 43.67 kg/m     Physical Exam Constitutional:      Appearance: Normal appearance.  Genitourinary:    Labia:        Left: Lesion present.      Vagina: Vaginal discharge (whitish) present.    Lymphadenopathy:     Lower Body: No right inguinal adenopathy. No left inguinal adenopathy.  Neurological:     General: No focal deficit present.     Mental Status: She is alert.  Psychiatric:        Mood and Affect: Mood normal.        Behavior: Behavior normal.     1. Labial abscess - pt will finish antibiotics and continue cleaning with bidet.  Advised I really think this has helped - recheck 2-3 weeks - advised pt to call with any new concerning symptoms  2. Vaginal discharge - Cervicovaginal ancillary only( [place])  3. Poorly controlled type 2 diabetes mellitus with circulatory disorder (HCC) - encouraged pt to be seen sooner with PCP for improved BS control

## 2023-06-27 ENCOUNTER — Institutional Professional Consult (permissible substitution) (HOSPITAL_BASED_OUTPATIENT_CLINIC_OR_DEPARTMENT_OTHER): Payer: Medicare HMO | Admitting: Internal Medicine

## 2023-06-27 LAB — CERVICOVAGINAL ANCILLARY ONLY
Bacterial Vaginitis (gardnerella): NEGATIVE
Candida Glabrata: NEGATIVE
Candida Vaginitis: POSITIVE — AB
Comment: NEGATIVE
Comment: NEGATIVE
Comment: NEGATIVE

## 2023-06-28 ENCOUNTER — Ambulatory Visit (HOSPITAL_BASED_OUTPATIENT_CLINIC_OR_DEPARTMENT_OTHER): Payer: Medicare HMO | Admitting: Obstetrics & Gynecology

## 2023-06-29 ENCOUNTER — Other Ambulatory Visit (HOSPITAL_COMMUNITY): Payer: Self-pay

## 2023-06-29 MED ORDER — FLUCONAZOLE 150 MG PO TABS
ORAL_TABLET | ORAL | 0 refills | Status: DC
  Filled 2023-06-29: qty 3, 7d supply, fill #0
  Filled 2023-07-05: qty 3, 9d supply, fill #0

## 2023-06-29 NOTE — Addendum Note (Signed)
Addended by: Jerene Bears on: 06/29/2023 05:51 PM   Modules accepted: Orders

## 2023-07-02 ENCOUNTER — Other Ambulatory Visit (HOSPITAL_COMMUNITY): Payer: Self-pay

## 2023-07-03 ENCOUNTER — Other Ambulatory Visit (HOSPITAL_BASED_OUTPATIENT_CLINIC_OR_DEPARTMENT_OTHER): Payer: Self-pay

## 2023-07-03 ENCOUNTER — Other Ambulatory Visit: Payer: Self-pay

## 2023-07-05 ENCOUNTER — Other Ambulatory Visit (HOSPITAL_COMMUNITY): Payer: Self-pay

## 2023-07-05 ENCOUNTER — Other Ambulatory Visit (HOSPITAL_BASED_OUTPATIENT_CLINIC_OR_DEPARTMENT_OTHER): Payer: Self-pay

## 2023-07-17 ENCOUNTER — Other Ambulatory Visit (HOSPITAL_BASED_OUTPATIENT_CLINIC_OR_DEPARTMENT_OTHER): Payer: Self-pay

## 2023-07-17 ENCOUNTER — Other Ambulatory Visit (HOSPITAL_COMMUNITY): Payer: Self-pay

## 2023-07-18 ENCOUNTER — Ambulatory Visit (HOSPITAL_BASED_OUTPATIENT_CLINIC_OR_DEPARTMENT_OTHER): Payer: Medicare HMO | Admitting: Obstetrics & Gynecology

## 2023-07-18 ENCOUNTER — Encounter (HOSPITAL_BASED_OUTPATIENT_CLINIC_OR_DEPARTMENT_OTHER): Payer: Self-pay | Admitting: Obstetrics & Gynecology

## 2023-07-18 VITALS — BP 100/61 | HR 97 | Wt 278.0 lb

## 2023-07-18 DIAGNOSIS — N764 Abscess of vulva: Secondary | ICD-10-CM

## 2023-07-20 NOTE — Progress Notes (Signed)
GYNECOLOGY  VISIT  CC:   f/u vulvar abscess  HPI: 67 y.o. G3P3 Married White or Caucasian female here for follow up/recheck vulva due to hx of vulvar abscess.  She feels the lesion is healing well.  She is working on being more active and walking more independently.  Son has tickets to the [facility] and she wants to be able to walk to go to the show.  Off antibiotics.  No fevers.  Having minimal drainage.   Past Medical History:  Diagnosis Date   Abnormal vaginal bleeding in postmenopausal patient 10/11/2021   Acute systolic heart failure (HCC)    Allergy    Asthma    Bilateral shoulder pain    Borderline hypertension    Bronchitis    CAD (coronary artery disease)    Chronic systolic CHF (congestive heart failure) (HCC)    Chronic systolic congestive heart failure, NYHA class 3 (HCC) 07/24/2018   Closed fracture of distal fibula 07/17/2022   Dehydration 12/20/2021   Diabetes mellitus    Diagnosed in 2000, on insulin, Trulicity and metformin, not checking cgs at home   Drug-induced hypotension 12/20/2021   Elevated serum creatinine 12/20/2021   Gangrenous appendicitis with perforation s/p lap appendectomy 10/04/2017 10/04/2017   GERD (gastroesophageal reflux disease)    History of MI (myocardial infarction)    History of radiation therapy    Uterus- 12/14/21-02/01/22- Dr. Antony Blackbird   Hyperlipidemia    Hypertension    Hypothyroidism    MGUS (monoclonal gammopathy of unknown significance)    Mitral regurgitation    Myocardial infarction (HCC)    Neuropathy    Obesity    Rash of back 01/06/2021   Sleep apnea    uses CPAP   Statin intolerance    Uterine cancer (HCC)     MEDS:   Current Outpatient Medications on File Prior to Visit  Medication Sig Dispense Refill   acetaminophen (TYLENOL) 500 MG tablet Take 500-1,000 mg by mouth every 6 (six) hours as needed for mild pain or moderate pain.     albuterol (PROAIR HFA) 108 (90 Base) MCG/ACT inhaler Inhale 2 puffs into  the lungs every 6 (six) hours as needed. wheezing 18 g 2   aspirin 81 MG chewable tablet Chew 1 tablet (81 mg total) by mouth daily. 90 tablet 3   blood glucose meter kit and supplies Use up to four times daily as directed. (FOR ICD-10 E10.9, E11.9). 1 each 99   carvedilol (COREG) 6.25 MG tablet Take 1 tablet (6.25 mg total) by mouth 2 (two) times daily. 180 tablet 1   Evolocumab (REPATHA SURECLICK) 140 MG/ML SOAJ Inject 140 mg into the skin every 14 (fourteen) days. 6 mL 3   fluconazole (DIFLUCAN) 150 MG tablet Take 1 tablet by mouth every 72 hours for total of 3 doses 3 tablet 0   furosemide (LASIX) 20 MG tablet Take 1 tablet (20 mg total) by mouth daily. 90 tablet 1   glucose blood test strip Use as directed to test blood sugar 100 each PRN   insulin glargine (LANTUS) 100 UNIT/ML Solostar Pen Inject 25-40 Units into the skin daily. 45 mL 3   levothyroxine (SYNTHROID) 50 MCG tablet Take 1 tablet (50 mcg total) by mouth daily, before a meal. 90 tablet 1   lidocaine (XYLOCAINE) 2 % jelly Apply 1 Application to tender areas topically 3 (three) times daily as needed. 30 mL 1   magnesium oxide (MAG-OX) 400 (240 Mg) MG tablet Take 1 tablet (  400 mg total) by mouth daily. 30 tablet 0   metFORMIN (GLUCOPHAGE) 1000 MG tablet Take 1 tablet (1,000 mg total) by mouth 2 (two) times daily with a meal. 180 tablet 1   Multiple Vitamin (MULITIVITAMIN WITH MINERALS) TABS Take 1 tablet by mouth daily with breakfast.     naproxen sodium (ALEVE) 220 MG tablet Take 440 mg by mouth daily as needed (pain).     OneTouch Delica Lancets 33G MISC use as directed 100 each 1   sacubitril-valsartan (ENTRESTO) 24-26 MG Take 1 tablet by mouth 2 (two) times daily. 180 tablet 1   spironolactone (ALDACTONE) 25 MG tablet Take 1 tablet (25 mg total) by mouth daily. 90 tablet 1   tirzepatide (MOUNJARO) 2.5 MG/0.5ML Pen Inject 2.5 mg into the skin once a week. 2 mL 0   triamcinolone ointment (KENALOG) 0.1 % Apply 1 application  topically two times daily to affected area(s) as needed, sparing use to avoid whitening/thinning skin 30 g 1   [DISCONTINUED] insulin detemir (LEVEMIR) 100 UNIT/ML FlexPen Inject 10 Units into the skin daily. 15 mL 11   No current facility-administered medications on file prior to visit.    ALLERGIES: Oxycodone hcl, Amoxicillin, Adhesive [tape], Exenatide, Invokana [canagliflozin], Jardiance [empagliflozin], Lisinopril, Other, and Statins  SH:  married, non smoker  Review of Systems  Constitutional: Negative.   Genitourinary: Negative.     PHYSICAL EXAMINATION:    BP 100/61 (BP Location: Right Arm, Patient Position: Sitting, Cuff Size: Large)   Pulse 97   Wt 278 lb (126.1 kg)   BMI 43.54 kg/m     Physical Exam Constitutional:      Appearance: Normal appearance.  Genitourinary:   Neurological:     General: No focal deficit present.     Mental Status: She is alert.  Psychiatric:        Mood and Affect: Mood normal.    Chaperone, Ina Homes, CMA, was present for exam.  Assessment/Plan: 1. Left genital labial abscess - this is healing really well.  Do not feel needs another follow up.  Recommended continuing with current care with keeping clean and dry as well as continue with her current increased activity level.

## 2023-07-30 ENCOUNTER — Other Ambulatory Visit (HOSPITAL_COMMUNITY): Payer: Self-pay

## 2023-08-02 ENCOUNTER — Other Ambulatory Visit (HOSPITAL_BASED_OUTPATIENT_CLINIC_OR_DEPARTMENT_OTHER): Payer: Self-pay

## 2023-08-03 ENCOUNTER — Other Ambulatory Visit (HOSPITAL_COMMUNITY): Payer: Self-pay

## 2023-08-07 ENCOUNTER — Other Ambulatory Visit (HOSPITAL_BASED_OUTPATIENT_CLINIC_OR_DEPARTMENT_OTHER): Payer: Self-pay | Admitting: Obstetrics & Gynecology

## 2023-08-07 DIAGNOSIS — B3731 Acute candidiasis of vulva and vagina: Secondary | ICD-10-CM

## 2023-08-08 ENCOUNTER — Other Ambulatory Visit (HOSPITAL_BASED_OUTPATIENT_CLINIC_OR_DEPARTMENT_OTHER): Payer: Self-pay

## 2023-08-08 MED ORDER — FLUCONAZOLE 150 MG PO TABS
ORAL_TABLET | ORAL | 0 refills | Status: DC
  Filled 2023-08-08: qty 3, 9d supply, fill #0

## 2023-08-15 ENCOUNTER — Encounter: Payer: Self-pay | Admitting: Gynecologic Oncology

## 2023-08-16 ENCOUNTER — Encounter: Payer: Medicare HMO | Admitting: Nurse Practitioner

## 2023-08-17 ENCOUNTER — Other Ambulatory Visit (HOSPITAL_BASED_OUTPATIENT_CLINIC_OR_DEPARTMENT_OTHER): Payer: Self-pay

## 2023-08-21 ENCOUNTER — Other Ambulatory Visit (HOSPITAL_COMMUNITY): Payer: Self-pay

## 2023-08-23 ENCOUNTER — Inpatient Hospital Stay: Payer: Medicare HMO | Admitting: Gynecologic Oncology

## 2023-08-23 ENCOUNTER — Telehealth: Payer: Self-pay | Admitting: *Deleted

## 2023-08-23 DIAGNOSIS — C541 Malignant neoplasm of endometrium: Secondary | ICD-10-CM

## 2023-08-23 NOTE — Progress Notes (Unsigned)
Did not come to her scheduled appt today

## 2023-08-23 NOTE — Telephone Encounter (Signed)
Returned patient's call and moved appt to Jan with Warner Mccreedy, NP and an appt with Dr Pricilla Holm in April

## 2023-08-24 DIAGNOSIS — H35373 Puckering of macula, bilateral: Secondary | ICD-10-CM | POA: Diagnosis not present

## 2023-08-24 DIAGNOSIS — E113213 Type 2 diabetes mellitus with mild nonproliferative diabetic retinopathy with macular edema, bilateral: Secondary | ICD-10-CM | POA: Diagnosis not present

## 2023-08-27 ENCOUNTER — Ambulatory Visit (INDEPENDENT_AMBULATORY_CARE_PROVIDER_SITE_OTHER): Payer: Medicare HMO | Admitting: Nurse Practitioner

## 2023-08-27 ENCOUNTER — Encounter: Payer: Self-pay | Admitting: Nurse Practitioner

## 2023-08-27 VITALS — BP 102/64 | HR 76 | Wt 280.0 lb

## 2023-08-27 DIAGNOSIS — E039 Hypothyroidism, unspecified: Secondary | ICD-10-CM | POA: Diagnosis not present

## 2023-08-27 DIAGNOSIS — L03315 Cellulitis of perineum: Secondary | ICD-10-CM

## 2023-08-27 DIAGNOSIS — E1169 Type 2 diabetes mellitus with other specified complication: Secondary | ICD-10-CM

## 2023-08-27 DIAGNOSIS — I152 Hypertension secondary to endocrine disorders: Secondary | ICD-10-CM

## 2023-08-27 DIAGNOSIS — E875 Hyperkalemia: Secondary | ICD-10-CM | POA: Diagnosis not present

## 2023-08-27 DIAGNOSIS — E785 Hyperlipidemia, unspecified: Secondary | ICD-10-CM

## 2023-08-27 DIAGNOSIS — E1142 Type 2 diabetes mellitus with diabetic polyneuropathy: Secondary | ICD-10-CM | POA: Diagnosis not present

## 2023-08-27 DIAGNOSIS — E1165 Type 2 diabetes mellitus with hyperglycemia: Secondary | ICD-10-CM

## 2023-08-27 DIAGNOSIS — E1159 Type 2 diabetes mellitus with other circulatory complications: Secondary | ICD-10-CM

## 2023-08-27 DIAGNOSIS — I739 Peripheral vascular disease, unspecified: Secondary | ICD-10-CM

## 2023-08-27 DIAGNOSIS — E66813 Obesity, class 3: Secondary | ICD-10-CM

## 2023-08-27 DIAGNOSIS — M5430 Sciatica, unspecified side: Secondary | ICD-10-CM

## 2023-08-27 DIAGNOSIS — I5022 Chronic systolic (congestive) heart failure: Secondary | ICD-10-CM

## 2023-08-27 DIAGNOSIS — N764 Abscess of vulva: Secondary | ICD-10-CM

## 2023-08-27 NOTE — Patient Instructions (Signed)
I will check to see what insulin is preferred and have her contact you to go over the GLP-1 medication options.   I am SO GLAD you are doing better!!! Please let me or Dr Hyacinth Meeker know if any new places show up.

## 2023-08-28 DIAGNOSIS — M5430 Sciatica, unspecified side: Secondary | ICD-10-CM | POA: Insufficient documentation

## 2023-08-28 NOTE — Assessment & Plan Note (Signed)
 Ongoing sciatic nerve pain exacerbated by physical activity, pain rated 6/10, affecting mobility and daily activities. Using a roll walker. Discussed benefits of physical therapy and need to monitor pain levels and adjust activity. - Continue using a roll walker for mobility - Consider physical therapy for pain management - Monitor pain levels and adjust activity as needed

## 2023-08-28 NOTE — Assessment & Plan Note (Signed)
 Blood sugars monitored daily, ranging from 120 to 160 mg/dL, with one instance of 779 mg/dL. On Lantus  insulin  at 25 units daily. Considering switching to semaglutide  due to insurance changes. Discussed cost and plan to find the least expensive option. - Continue Lantus  insulin  at 25 units daily - Check with pharmacy for the least expensive option for semaglutide  and inform the doctor - Monitor blood sugars daily

## 2023-08-28 NOTE — Assessment & Plan Note (Signed)
 Severe cellulitis led to sepsis, requiring a 7-day hospitalization for IV antibiotics. Condition mostly resolved, but a small area remains sticky and may be slightly draining. Managing with iodoform to keep it dry. Discussed monitoring for signs of infection and maintaining cleanliness. - Keep the area clean and dry - Monitor for signs of infection such as increased redness, warmth, or swelling - Follow up with primary care if symptoms worsen

## 2023-08-29 LAB — CMP14+EGFR
ALT: 16 [IU]/L (ref 0–32)
AST: 19 [IU]/L (ref 0–40)
Albumin: 3.8 g/dL — ABNORMAL LOW (ref 3.9–4.9)
Alkaline Phosphatase: 76 [IU]/L (ref 44–121)
BUN/Creatinine Ratio: 22 (ref 12–28)
BUN: 25 mg/dL (ref 8–27)
Bilirubin Total: <0.2 mg/dL (ref 0.0–1.2)
CO2: 20 mmol/L (ref 20–29)
Calcium: 9.5 mg/dL (ref 8.7–10.3)
Chloride: 103 mmol/L (ref 96–106)
Creatinine, Ser: 1.15 mg/dL — ABNORMAL HIGH (ref 0.57–1.00)
Globulin, Total: 3.6 g/dL (ref 1.5–4.5)
Glucose: 189 mg/dL — ABNORMAL HIGH (ref 70–99)
Potassium: 5.2 mmol/L (ref 3.5–5.2)
Sodium: 141 mmol/L (ref 134–144)
Total Protein: 7.4 g/dL (ref 6.0–8.5)
eGFR: 53 mL/min/{1.73_m2} — ABNORMAL LOW (ref >59)

## 2023-08-29 LAB — CBC WITH DIFFERENTIAL/PLATELET
Basophils Absolute: 0 10*3/uL (ref 0.0–0.2)
Basos: 1 %
EOS (ABSOLUTE): 0.2 10*3/uL (ref 0.0–0.4)
Eos: 4 %
Hematocrit: 39.9 % (ref 34.0–46.6)
Hemoglobin: 12.5 g/dL (ref 11.1–15.9)
Immature Grans (Abs): 0 10*3/uL (ref 0.0–0.1)
Immature Granulocytes: 1 %
Lymphocytes Absolute: 1 10*3/uL (ref 0.7–3.1)
Lymphs: 18 %
MCH: 27.7 pg (ref 26.6–33.0)
MCHC: 31.3 g/dL — ABNORMAL LOW (ref 31.5–35.7)
MCV: 89 fL (ref 79–97)
Monocytes Absolute: 0.4 10*3/uL (ref 0.1–0.9)
Monocytes: 6 %
Neutrophils Absolute: 4.1 10*3/uL (ref 1.4–7.0)
Neutrophils: 70 %
Platelets: 210 10*3/uL (ref 150–450)
RBC: 4.51 x10E6/uL (ref 3.77–5.28)
RDW: 14.1 % (ref 11.7–15.4)
WBC: 5.8 10*3/uL (ref 3.4–10.8)

## 2023-08-29 LAB — MAGNESIUM: Magnesium: 2.2 mg/dL (ref 1.6–2.3)

## 2023-08-29 LAB — HEMOGLOBIN A1C
Est. average glucose Bld gHb Est-mCnc: 169 mg/dL
Hgb A1c MFr Bld: 7.5 % — ABNORMAL HIGH (ref 4.8–5.6)

## 2023-08-30 ENCOUNTER — Other Ambulatory Visit: Payer: Self-pay | Admitting: Nurse Practitioner

## 2023-08-30 ENCOUNTER — Other Ambulatory Visit (HOSPITAL_COMMUNITY): Payer: Self-pay

## 2023-08-30 DIAGNOSIS — Z1231 Encounter for screening mammogram for malignant neoplasm of breast: Secondary | ICD-10-CM

## 2023-09-04 ENCOUNTER — Telehealth: Payer: Self-pay

## 2023-09-04 ENCOUNTER — Other Ambulatory Visit (INDEPENDENT_AMBULATORY_CARE_PROVIDER_SITE_OTHER): Payer: Medicare HMO

## 2023-09-04 DIAGNOSIS — E1165 Type 2 diabetes mellitus with hyperglycemia: Secondary | ICD-10-CM

## 2023-09-04 DIAGNOSIS — E1159 Type 2 diabetes mellitus with other circulatory complications: Secondary | ICD-10-CM

## 2023-09-04 NOTE — Progress Notes (Signed)
 09/04/2023 Name: Terri Heath MRN: [id number] DOB: 05-Nov-1955  Chief Complaint  Patient presents with   Medication Assistance    Terri Heath is a 68 y.o. year old female who presented for a telephone visit.   They were referred to the pharmacist by their PCP for assistance in managing medication access.    Subjective:  Care Team: Primary Care Provider: Oris Camie BRAVO, NP ; Next Scheduled Visit: 11/01/23   Medication Access/Adherence  Current Pharmacy:  [facility] - [facility] [address] [place] [address] Phone: [phone] Fax: [phone]   Patient reports affordability concerns with their medications: Yes  - repatha  and entresto  grants have expired, lantus  no longer covered by insurance Patient reports access/transportation concerns to their pharmacy: No  Patient reports adherence concerns with their medications:  No     Medication Access: -Currently on lantus  but that has been taken off her insurance formulary for 2025 and will no longer be covered -Previously on trulicity  but stopped due to cost concerns -Was getting entresto  and repatha  through the [employer] but coverage has expired    Objective:  Lab Results  Component Value Date   HGBA1C 7.5 (H) 08/27/2023    Lab Results  Component Value Date   CREATININE 1.15 (H) 08/27/2023   BUN 25 08/27/2023   NA 141 08/27/2023   K 5.2 08/27/2023   CL 103 08/27/2023   CO2 20 08/27/2023    Lab Results  Component Value Date   CHOL 181 05/03/2023   HDL 53 05/03/2023   LDLCALC 89 05/03/2023   TRIG 235 (H) 05/03/2023   CHOLHDL 3.4 05/03/2023    Medications Reviewed Today     Reviewed by Lionell Jon DEL, RPH (Pharmacist) on 09/04/23 at 1526  Med List Status: <None>   Medication Order Taking? Sig Documenting Provider Last Dose Status Informant  acetaminophen  (TYLENOL ) 500 MG tablet [id number] No Take 500-1,000 mg by mouth every 6 (six) hours as needed for  mild pain or moderate pain. [provider] Taking Active Self, Pharmacy Records  albuterol  (PROAIR  HFA) 108 [phone] Base) MCG/ACT inhaler [id number] No Inhale 2 puffs into the lungs every 6 (six) hours as needed. wheezing Jude Harden GAILS, MD Taking Active Self, Pharmacy Records           Med Note BEVERLEE, LUCIENNE JULIANNA Debar Sep 12, 2022  4:11 PM) prn  aspirin  81 MG chewable tablet [id number] No Chew 1 tablet (81 mg total) by mouth daily. Rolan Ezra RAMAN, MD Taking Active Self, Pharmacy Records  blood glucose meter kit and supplies [id number] No Use up to four times daily as directed. (FOR ICD-10 E10.9, E11.9). Oris Camie BRAVO, NP Taking Active Self, Pharmacy Records  carvedilol  (COREG ) 6.25 MG tablet [id number] No Take 1 tablet (6.25 mg total) by mouth 2 (two) times daily. Oris Camie BRAVO, NP Taking Active Self, Pharmacy Records  diclofenac  Sodium (VOLTAREN ) 1 % GEL [id number] No Apply topically 4 (four) times daily. [provider] Taking Active   Evolocumab  (REPATHA  SURECLICK) 140 MG/ML SOAJ [id number] No Inject 140 mg into the skin every 14 (fourteen) days. Rolan Ezra RAMAN, MD Taking Active Self, Pharmacy Records           Med Note IDAMAE ALFREIDA LITTIE Charlotte Jun 07, 2023  9:19 PM) Very other Thursday  furosemide  (LASIX ) 20 MG tablet [id number] No Take 1 tablet (20 mg total) by mouth daily. Early, Sara E, NP Taking Active Self, Pharmacy  Records  glucose blood test strip [id number] No Use as directed to test blood sugar Early, Camie BRAVO, NP Taking Active Self, Pharmacy Records  Discontinued 10/26/20 1434   insulin  glargine (LANTUS ) 100 UNIT/ML Solostar Pen [id number] No Inject 25-40 Units into the skin daily. Oris Camie BRAVO, NP Taking Active Self, Pharmacy Records  levothyroxine  (SYNTHROID ) 50 MCG tablet [id number] No Take 1 tablet (50 mcg total) by mouth daily, before a meal. Early, Camie BRAVO, NP Taking Active Self, Pharmacy Records  magnesium  oxide (MAG-OX) 400 (240 Mg) MG tablet [id number] No Take 1  tablet (400 mg total) by mouth daily. Lonn Hicks, MD Taking Active Self, Pharmacy Records  metFORMIN  (GLUCOPHAGE ) 1000 MG tablet [id number] No Take 1 tablet (1,000 mg total) by mouth 2 (two) times daily with a meal. Early, Camie BRAVO, NP Taking Active Self, Pharmacy Records  Multiple Vitamin (MULITIVITAMIN WITH MINERALS) TABS [id number] No Take 1 tablet by mouth daily with breakfast. [provider] Taking Active Self, Pharmacy Records  naproxen  sodium (ALEVE ) 220 MG tablet [id number] No Take 440 mg by mouth daily as needed (pain). [provider] Taking Active Self, Pharmacy Records  [facility] Lancets 33G [place] [id number] No use as directed Early, Camie BRAVO, NP Taking Active Self, Pharmacy Records  sacubitril -valsartan  (ENTRESTO ) 24-26 MG [id number] No Take 1 tablet by mouth 2 (two) times daily. Oris Camie BRAVO, NP Taking Active Self, Pharmacy Records  spironolactone  (ALDACTONE ) 25 MG tablet [id number] No Take 1 tablet (25 mg total) by mouth daily. Oris Camie BRAVO, NP Taking Active Self, Pharmacy Records  triamcinolone  ointment (KENALOG ) 0.1 % [id number] No Apply 1 application topically two times daily to affected area(s) as needed, sparing use to avoid whitening/thinning skin Early, Camie BRAVO, NP Taking Active Self, Pharmacy Records           Med Note (JORDAN, KIMBERLY B   Tue Nov 07, 2022 11:55 AM)                Assessment/Plan:   Medication Access: -Reviewed patient's household size and income. Patient should qualify for PAP for Lantus  through [employer] and Ozempic  through [employer]. Will prepare applications for patient and patient states she will come by on 09/06/23 to sign. -[employer] for Entresto  renewed and billing info listed below for reference:   -[employer] for Repatha  renewed and billing info listed below for reference:     Follow Up Plan: 2 weeks  Jon VEAR Lindau, PharmD Clinical Pharmacist [phone]

## 2023-09-04 NOTE — Progress Notes (Signed)
 Care Guide Pharmacy Note  09/04/2023 Name: Terri Heath MRN: [id number] DOB: 07-09-1956  Referred By: Oris Camie BRAVO, NP Reason for referral: Care Coordination (Outreach to schedule with pharm d )   Terri Heath is a 68 y.o. year old female who is a primary care patient of Early, Camie BRAVO, NP.  Terri Heath was referred to the pharmacist for assistance related to: DMII  Successful contact was made with the patient to discuss pharmacy services including being ready for the pharmacist to call at least 5 minutes before the scheduled appointment time and to have medication bottles and any blood pressure readings ready for review. The patient agreed to meet with the pharmacist via telephone visit on (date/time).09/04/2023  Jeoffrey Buffalo , RMA     Edith Endave  [facility], [facility] Guide  Direct Dial: [phone]  Website: [email or web address]

## 2023-09-10 ENCOUNTER — Other Ambulatory Visit: Payer: Self-pay | Admitting: Nurse Practitioner

## 2023-09-10 ENCOUNTER — Other Ambulatory Visit (HOSPITAL_COMMUNITY): Payer: Self-pay

## 2023-09-10 DIAGNOSIS — E1165 Type 2 diabetes mellitus with hyperglycemia: Secondary | ICD-10-CM

## 2023-09-13 DIAGNOSIS — Z1231 Encounter for screening mammogram for malignant neoplasm of breast: Secondary | ICD-10-CM

## 2023-09-20 ENCOUNTER — Inpatient Hospital Stay: Payer: Medicare HMO

## 2023-09-20 ENCOUNTER — Inpatient Hospital Stay: Payer: Medicare HMO | Attending: Gynecologic Oncology | Admitting: Gynecologic Oncology

## 2023-09-20 ENCOUNTER — Other Ambulatory Visit: Payer: Medicare HMO

## 2023-09-20 VITALS — BP 121/75 | HR 96 | Resp 20 | Wt 276.4 lb

## 2023-09-20 DIAGNOSIS — T148XXA Other injury of unspecified body region, initial encounter: Secondary | ICD-10-CM

## 2023-09-20 DIAGNOSIS — C541 Malignant neoplasm of endometrium: Secondary | ICD-10-CM | POA: Diagnosis not present

## 2023-09-20 DIAGNOSIS — Z9221 Personal history of antineoplastic chemotherapy: Secondary | ICD-10-CM | POA: Insufficient documentation

## 2023-09-20 DIAGNOSIS — Z90710 Acquired absence of both cervix and uterus: Secondary | ICD-10-CM | POA: Insufficient documentation

## 2023-09-20 DIAGNOSIS — Z8542 Personal history of malignant neoplasm of other parts of uterus: Secondary | ICD-10-CM | POA: Diagnosis not present

## 2023-09-20 DIAGNOSIS — Z923 Personal history of irradiation: Secondary | ICD-10-CM | POA: Insufficient documentation

## 2023-09-20 NOTE — Patient Instructions (Signed)
It was nice seeing you today. No concerning findings for cancer return on today's exam.   Today we cultured the drainage from the draining area on your left vulva. We will let you know the results. Please call for worsening symptoms in this area so we can send in an antibiotic.   We will plan on seeing you in three months or sooner if needed. Please call the office for any needs, concerns, or new symptoms.   Symptoms to report to your health care team include vaginal bleeding, rectal bleeding, bloating, weight loss without effort, new and persistent pain, new and  persistent fatigue, new leg swelling, new masses (i.e., bumps in your neck or groin), new and persistent cough, new and persistent nausea and vomiting, change in bowel or bladder habits, and any other concerns.

## 2023-09-20 NOTE — Progress Notes (Signed)
Gynecologic Oncology Return Clinic Visit  09/20/2023  Reason for Visit: surveillance in the setting of endometrial cancer   Treatment History: Oncology History Overview Note  Endometrioid adenocarcinoma   History of endometrial cancer  10/27/2021 Pathology Results   FINAL MICROSCOPIC DIAGNOSIS:   A. ENDOMETRIUM, BIOPSY:  - Endometrioid adenocarcinoma with focal secretory features.  - See comment.   COMMENT:  The carcinoma has endometrioid features with a few small foci with secretory features.  There are several foci of solid pattern and the findings are consistent with FIGO grade 1/2.  Immunohistochemistry shows diffuse strong positivity estrogen receptor and Napsin A is negative.  P53 shows wild-type pattern.    11/07/2021 Initial Diagnosis   Endometrial cancer (HCC)   11/14/2021 Imaging   MRI pelvis 1. Thickened (18 mm) endometrium, compatible with known endometrial malignancy. Evidence of full-thickness myometrial invasion by endometrial tumor throughout the left uterine body extending over 180 degrees of involvement. Probable early small focus of direct tumor invasion into the parametrial soft tissues in the anterior left uterine body as detailed. 2. Suspect a combination of direct endometrial tumor involvement of the upper endocervical canal and blood products within the endocervix. 3. No pelvic lymphadenopathy.  No adnexal masses.       11/24/2021 Imaging   CT abdomen and pelvis No evidence of abdominal metastatic disease or other acute findings.   Mild hepatic steatosis.   Colonic diverticulosis, without radiographic evidence of diverticulitis.   Aortic Atherosclerosis (ICD10-I70.0).     12/02/2021 Cancer Staging   Staging form: Corpus Uteri - Carcinoma and Carcinosarcoma, AJCC 8th Edition - Clinical stage from 12/02/2021: FIGO Stage IIIB (cT3b, cN0, cM0) - Signed by Artis Delay, MD on 12/02/2021 Stage prefix: Initial diagnosis   12/07/2021 Procedure   Status post  placement of right IJ port catheter   12/13/2021 - 01/10/2022 Chemotherapy   Patient is on Treatment Plan : Uterine Cisplatin days 1 and 29 + XRT      12/14/2021 - 02/01/2022 Radiation Therapy   Radiation Treatment Dates: 12/14/2021 through 02/01/2022 (IMRT : 12/14/21 through 01/17/22) (Brachytherapy - Tandem Ring : 02/01/22)  Site Technique Total Dose (Gy) Dose per Fx (Gy) Completed Fx Beam Energies  Uterus: Uterus IMRT 45/45 1.8 25/25 10X  Uterus: Uterus_Bst HDR-brachy 5.5/5.5 5.5 1/1 Ir-192        01/25/2022 Imaging   MRI pelvis Decreased endometrial thickness, consistent with interval response to therapy. Persistent deep myometrial invasion (> 50%) noted in the left lateral uterine corpus. No evidence of extra-uterine tumor extension.    No evidence of pelvic metastatic disease.   Sigmoid diverticulosis, without evidence of diverticulitis.   03/21/2022 Pathology Results   FINAL MICROSCOPIC DIAGNOSIS:   A. 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, FIGO 1  Focal residual atypical hyperplasia with squamous morular metaplasia  Tumor invades greater than 50% of the myometrium (9.75 mm of 16 mm)  (ypT1b)  Margins free  Extensive adenomyosis  Background inactive endometrium  Serosal endosalpingosis  Acute and chronic cervicitis with mucosal necrosis and numerous  nabothian cysts and tunnel clusters  Benign fallopian tubes and ovaries   ONCOLOGY TABLE:   UTERUS, CARCINOMA OR CARCINOSARCOMA: Resection   Procedure: Total hysterectomy and bilateral salpingo-oophorectomy  Histologic Type: Endometrioid adenocarcinoma  Histologic Grade: Well-differentiated, FIGO 1  Myometrial Invasion:       Depth of Myometrial Invasion (mm): 9.75 mm       Myometrial Thickness (mm):  16 mm       Percentage of Myometrial Invasion: 61%  Uterine Serosa Involvement: Not  identified  Cervical stromal Involvement: Not identified  Extent of involvement of other tissue/organs: Not identified  Peritoneal/Ascitic Fluid: Not available  Lymphovascular Invasion: Not identified  Regional Lymph Nodes: Not applicable (no lymph nodes submitted or found)   Distant Metastasis: Not applicable  Pathologic Stage Classification (pTNM, AJCC 8th Edition): ypT1b, pN n/a  Ancillary Studies: MMR / MSI has been ordered and will be reported in an  addendum  Representative Tumor Block: A7  Comment(s): The anterior endomyometrium shows extensive adenomyosis and scattered intramyometrial psammomatous calcifications.  Residual invasive tumor is identified within the posterior endomyometrium.  The otherwise benign endometrial and cervical epithelium shows apparent reactive/therapy associated atypia.  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 Obesity made retroperitoneal visualization limited and increased the complexity of the case and necessitated additional instrumentation for retraction. Obesity related complexity increased the duration of the procedure by 45 minutes.    Surgeon: Eugene Garnet MD    Operative Findings:  On EUA, 10 cm mobile uterus. On intra-abdominal entry, normal upper abdominal survey.  Omentum densely adherent to anterior abdominal wall along prior midline infraumbilical incision.  Normal-appearing small and large bowel.  Difficult intra-abdominal adiposity.  Cecum somewhat adherent to the right abdominal wall secondary to prior appendectomy.  Uterus approximately  8-10 cm and somewhat bulbous.  Normal-appearing bilateral adnexa.  No obvious adenopathy.  Some edema of the retroperitoneum.  Densely adherent bladder to the uterus and cervix from prior cesarean sections.  No gross evidence of disease. On cystoscopy, intact bladder dome. Good efflux from bilateral ureteral orifices.   04/05/2022 Procedure   Removal of implanted Port-A-Cath utilizing sharp and blunt dissection. The procedure was uncomplicated.   09/04/2022 Imaging   CT A/P: No acute abdominal or pelvic pathology. No evidence abdominal or pelvic metastatic disease.    Admission to the hospital for sepsis, labial abscess from 06/07/2023 through 06/14/2023. CT AP with contrast was performed during this time on 06/07/2023 and 06/12/2023  Interval History: She presents today to the office for continued follow up. She reports doing over all well. She had a nice holiday time with her family. In October 2024, she was admitted to the hospital for 7 days with a labial abscess and was found to be septic on admission. She did have CT imaging on 06/07/23 and 06/12/23 during this admission.  After this, she can become easily fatigued but she is trying to do more. Has to take breaks when walking distances. She was swimming on a regular basis before being sick in the fall and she is trying to get back into this. She has an area on the left vulva currently draining, with less drainage today. The drainage has cleared up as well. She has had these in the past and they typically clear up.   Denies chest pain, dyspnea. No abdominal pain or pelvic pain. No bloating. Reports urethral irritation intermittently. Has to sit longer to completely empty her bladder. Does not feel she has a bladder infection. No dysuria. No vaginal bleeding or discharge. On semuglutide and has mild constipation at times.  Has bilateral lower extremity neuropathy. No concerning findings for recurrence voiced.  Of note, her last A1C on 08/27/23 was  down to 7.5. Scheduled for screening mammogram on 10/04/2023.  Past Medical/Surgical History: Past Medical History:  Diagnosis Date   Abnormal vaginal bleeding in postmenopausal patient 10/11/2021   Acute systolic heart failure (HCC)    Allergy    Asthma    Bilateral shoulder pain    Borderline hypertension    Bronchitis    CAD (coronary artery disease)    Chronic systolic CHF (congestive heart failure) (HCC)    Chronic systolic congestive heart failure, NYHA class 3 (HCC) 07/24/2018   Closed fracture of distal fibula 07/17/2022   Dehydration 12/20/2021   Diabetes mellitus    Diagnosed in 2000, on insulin, Trulicity and metformin, not checking cgs at home   Drug-induced hypotension 12/20/2021   Elevated serum creatinine 12/20/2021   Encounter to establish care 01/06/2021   Gangrenous appendicitis with perforation s/p lap appendectomy 10/04/2017 10/04/2017   GERD (gastroesophageal reflux disease)    History of MI (myocardial infarction)    History of radiation therapy    Uterus- 12/14/21-02/01/22- Dr. Antony Blackbird   Hyperlipidemia    Hypertension    Hypothyroidism    MGUS (monoclonal gammopathy of unknown significance)    Mitral regurgitation    Myocardial infarction (HCC)    Neuropathy    Obesity    Rash of back 01/06/2021   Sleep apnea    uses CPAP   Statin intolerance    Uterine cancer [facility])     Past Surgical History:  Procedure Laterality Date   BREATH TEK H PYLORI  09/18/2011   Procedure: BREATH TEK H PYLORI;  Surgeon: Kandis Cocking, MD;  Location: [facility];  Service: General;  Laterality: N/A;   CESAREAN SECTION     x 3   IR IMAGING GUIDED PORT INSERTION  12/07/2021   IR REMOVAL TUN ACCESS W/ PORT W/O FL MOD SED  04/04/2022   LAPAROSCOPIC APPENDECTOMY N/A 10/03/2017   Procedure: APPENDECTOMY LAPAROSCOPIC;  Surgeon: Romie Levee, MD;  Location: [facility];  Service: General;  Laterality: N/A;   OPERATIVE ULTRASOUND N/A 02/01/2022   Procedure: OPERATIVE ULTRASOUND;   Surgeon: Antony Blackbird, MD;  Location: [facility];  Service: Urology;  Laterality: N/A;   RIGHT/LEFT HEART CATH AND CORONARY ANGIOGRAPHY N/A 02/12/2018   Procedure: RIGHT/LEFT HEART CATH AND CORONARY ANGIOGRAPHY;  Surgeon: Corky Crafts, MD;  Location: [facility] INVASIVE CV LAB;  Service: Cardiovascular;  Laterality: N/A;   ROBOTIC ASSISTED TOTAL HYSTERECTOMY WITH BILATERAL SALPINGO OOPHERECTOMY Bilateral 03/21/2022   Procedure: XI ROBOTIC ASSISTED TOTAL HYSTERECTOMY WITH BILATERAL SALPINGO OOPHORECTOMY, LYSIS OF ADHESIONS, CYSTOSCOPY;  Surgeon: Carver Fila, MD;  Location: [facility];  Service: Gynecology;  Laterality: Bilateral;   TANDEM RING INSERTION N/A 02/01/2022   Procedure: TANDEM RING INSERTION;  Surgeon: Antony Blackbird, MD;  Location: [facility];  Service: Urology;  Laterality: N/A;   TUBAL LIGATION      Family History  Problem Relation Age of Onset   Alcohol abuse Mother    Arthritis Mother    Diabetes Mother    Sleep apnea Mother    Anxiety disorder Mother    Eating disorder Mother    Obesity Mother    Cancer Father    Alcohol abuse Father    Heart disease Father    Hypertension Father    Stroke Father    Sleep apnea Sister    Breast cancer Neg Hx    Colon  cancer Neg Hx    Ovarian cancer Neg Hx    Endometrial cancer Neg Hx    Pancreatic cancer Neg Hx    Prostate cancer Neg Hx     Social History   Socioeconomic History   Marital status: Married    Spouse name: Not on file   Number of children: Not on file   Years of education: Not on file   Highest education level: Not on file  Occupational History   Occupation: retired - EKG tech, worked at [employer]  Tobacco Use   Smoking status: Former    Current packs/day: 0.00    Average packs/day: 1 pack/day for 15.0 years (15.0 ttl pk-yrs)    Types: Cigarettes    Start date: 01/29/1975    Quit date: 01/28/1990    Years since quitting: 33.6   Smokeless tobacco: Never  Vaping Use   Vaping status: Never Used  Substance and Sexual  Activity   Alcohol use: Not Currently   Drug use: No   Sexual activity: Not Currently    Birth control/protection: Post-menopausal, Abstinence  Other Topics Concern   Not on file  Social History Narrative   Lives in [place]   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   Social Drivers of Health   Financial Resource Strain: Low Risk  (05/03/2023)   Overall Financial Resource Strain (CARDIA)    Difficulty of Paying Living Expenses: Not very hard  Food Insecurity: No Food Insecurity (06/15/2023)   Hunger Vital Sign    Worried About Running Out of Food in the Last Year: Never true    Ran Out of Food in the Last Year: Never true  Transportation Needs: No Transportation Needs (06/15/2023)   PRAPARE - Administrator, Civil Service (Medical): No    Lack of Transportation (Non-Medical): No  Physical Activity: Insufficiently Active (05/03/2023)   Exercise Vital Sign    Days of Exercise per Week: 1 day    Minutes of Exercise per Session: 30 min  Stress: No Stress Concern Present (05/03/2023)   [employer] of Occupational Health - Occupational Stress Questionnaire    Feeling of Stress : Only a little  Social Connections: Unknown (05/03/2023)   Social Connection and Isolation Panel [NHANES]    Frequency of Communication with Friends and Family: Three times a week    Frequency of Social Gatherings with Friends and Family: Once a week    Attends Religious Services: Patient unable to answer    Active Member of Clubs or Organizations: Yes    Attends Banker Meetings: Never    Marital Status: Married    Current Medications:  Current Outpatient Medications:    acetaminophen (TYLENOL) 500 MG tablet, Take 500-1,000 mg by mouth every 6 (six) hours as needed for mild pain or moderate pain., Disp: , Rfl:    albuterol (PROAIR HFA) 108 (90 Base) MCG/ACT inhaler, Inhale 2 puffs into the lungs every 6 (six) hours as needed. wheezing, Disp: 18 g, Rfl: 2   aspirin 81 MG  chewable tablet, Chew 1 tablet (81 mg total) by mouth daily., Disp: 90 tablet, Rfl: 3   blood glucose meter kit and supplies, Use up to four times daily as directed. (FOR ICD-10 E10.9, E11.9)., Disp: 1 each, Rfl: 99   carvedilol (COREG) 6.25 MG tablet, Take 1 tablet (6.25 mg total) by mouth 2 (two) times daily., Disp: 180 tablet, Rfl: 1   diclofenac Sodium (VOLTAREN) 1 % GEL, Apply 2 g topically  as needed., Disp: , Rfl:    Evolocumab (REPATHA SURECLICK) 140 MG/ML SOAJ, Inject 140 mg into the skin every 14 (fourteen) days., Disp: 6 mL, Rfl: 3   furosemide (LASIX) 20 MG tablet, Take 1 tablet (20 mg total) by mouth daily., Disp: 90 tablet, Rfl: 1   glucose blood test strip, Use as directed to test blood sugar, Disp: 100 each, Rfl: PRN   insulin glargine (LANTUS) 100 UNIT/ML Solostar Pen, Inject 25-40 Units into the skin daily., Disp: 45 mL, Rfl: 3   levothyroxine (SYNTHROID) 50 MCG tablet, Take 1 tablet (50 mcg total) by mouth daily, before a meal., Disp: 90 tablet, Rfl: 1   MAGNESIUM GLYCINATE PO, Take 1 tablet by mouth daily., Disp: , Rfl:    metFORMIN (GLUCOPHAGE) 1000 MG tablet, Take 1 tablet (1,000 mg total) by mouth 2 (two) times daily with a meal., Disp: 180 tablet, Rfl: 1   Multiple Vitamin (MULITIVITAMIN WITH MINERALS) TABS, Take 1 tablet by mouth daily with breakfast., Disp: , Rfl:    naproxen sodium (ALEVE) 220 MG tablet, Take 440 mg by mouth daily as needed (pain)., Disp: , Rfl:    OneTouch Delica Lancets 33G MISC, use as directed, Disp: 100 each, Rfl: 1   sacubitril-valsartan (ENTRESTO) 24-26 MG, Take 1 tablet by mouth 2 (two) times daily., Disp: 180 tablet, Rfl: 1   spironolactone (ALDACTONE) 25 MG tablet, Take 1 tablet (25 mg total) by mouth daily., Disp: 90 tablet, Rfl: 1   triamcinolone ointment (KENALOG) 0.1 %, Apply 1 application topically two times daily to affected area(s) as needed, sparing use to avoid whitening/thinning skin, Disp: 30 g, Rfl: 1  Review of Systems: +urinary  frequency, ulcer on labia with discharge Denies appetite changes, fevers, chills, unexplained weight changes.  Denies hearing loss, neck lumps or masses, mouth sores, ringing in ears or voice changes. Denies cough or wheezing.  Denies shortness of breath. Denies chest pain or palpitations. Denies leg swelling. Denies abdominal distention, pain, blood in stools, constipation, diarrhea, nausea, vomiting, or early satiety. Denies pain with intercourse, dysuria, hematuria. Denies hot flashes, pelvic pain, vaginal bleeding or vaginal discharge. Denies joint pain, back pain or muscle pain/cramps. +neuropathy Denies itching, rash. Denies new dizziness, headaches, numbness or seizures. Denies swollen lymph nodes or glands, denies easy bruising or bleeding. Denies anxiety, depression, confusion, or decreased concentration.  Physical Exam: BP 121/75 (BP Location: Left Arm, Patient Position: Sitting)   Pulse 96   Resp 20   Wt 276 lb 6.4 oz (125.4 kg)   SpO2 100%   BMI 43.29 kg/m  General: Alert, oriented, no acute distress. HEENT: Normocephalic, atraumatic, sclera anicteric. Chest: Clear to auscultation bilaterally.  No wheezes or rhonchi.   Cardiovascular: Regular rate and rhythm, no murmurs. Abdomen: Obese, soft, nontender.  Normoactive bowel sounds.  No masses or hepatosplenomegaly appreciated.  Well-healed incisions. Limited due to habitus. Extremities: Grossly normal range of motion.  Warm, well perfused.  Trace edema bilaterally. Lymphatics: No cervical, supraclavicular, or inguinal adenopathy. GU: Normal appearing external genitalia without erythema, excoriation. To the left where the labia majora meets the vulva, there are several dilated skin pores. The superior dilated area with scant amount of clear drainage with light palpation. No underlying collections/fluctuance palpated. No surrounding erythema or tenderness. Culture obtained from the draining area. Healed scar noted to the left  lower perineal/buttock area.  Speculum exam reveals moderately atrophic vaginal mucosa, vaginal apex with some radiation changes.  No masses or atypical vascularity.  Bimanual exam reveals cuff  intact, no nodularity or firmness.  Rectovaginal exam confirms findings.  Laboratory & Radiologic Studies: CT AP on 06/12/2023: 1. Near confluent edema/inflammation in the left labium, likely with some associated skin thickening. Edema/inflammation tracks inferiorly into the left perineum and superiorly towards the left groin region. No discrete or rim enhancing fluid collection to suggest the presence of a drainable abscess. No soft tissue gas in the pelvic floor, labium or perineum. 2. Left colonic diverticulosis without diverticulitis. 3.  Aortic Atherosclerosis  CT AP on 06/07/2023: -Inflammatory stranding and edema within the left labia extending into the mons pubis. Small 1.4 cm focus of soft tissue thickening along the left labia with tiny focus of gas compatible with phlegmon. No drainable fluid collection. -Aortic Atherosclerosis  Assessment & Plan: Terri Heath is a 68 y.o. 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 Surgery 02/2022. P53 wildtype. 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  MSI high.   Patient is doing well and continues to be NED on exam today. Culture obtained from draining vulvar area and she will be contacted with the results. Based on appearance, no intervention needed at this time. The patient knows to call if her symptoms worsen.     Per NCCN surveillance recommendations, we will plan on surveillance visits every 3 months for the first 2-3 years.  We discussed signs and symptoms that would be concerning for cancer recurrence and she is advised to call if she develops any of these.  20 minutes of total time was spent for this patient encounter, including preparation,  face-to-face counseling with the patient and coordination of care, and documentation of the encounter.  Warner Mccreedy NP [facility]

## 2023-09-21 ENCOUNTER — Telehealth: Payer: Self-pay | Admitting: *Deleted

## 2023-09-21 NOTE — Telephone Encounter (Signed)
-----   Message from Doylene Bode sent at 09/21/2023  8:56 AM EST ----- Please call and let her know there has been no growth of bacteria from the culture so far. We will let her know when the final results return. ----- Message ----- From: Interface, Lab In [place] Sent: 09/20/2023  10:10 PM EST To: Doylene Bode, NP

## 2023-09-21 NOTE — Telephone Encounter (Signed)
Attempted to reach patient in regards to message from Warner Mccreedy, NP that there has been no growth of bacteria from the culture so far. We will let you know when the final results return. LVM.

## 2023-09-24 ENCOUNTER — Encounter (HOSPITAL_COMMUNITY): Payer: Self-pay | Admitting: Cardiology

## 2023-09-24 ENCOUNTER — Ambulatory Visit (HOSPITAL_COMMUNITY)
Admission: RE | Admit: 2023-09-24 | Discharge: 2023-09-24 | Disposition: A | Discharge status: Home / Self Care | Payer: Medicare HMO | Source: Ambulatory Visit | Attending: Cardiology | Admitting: Cardiology

## 2023-09-24 VITALS — BP 104/70 | HR 93 | Wt 278.2 lb

## 2023-09-24 DIAGNOSIS — Z6841 Body Mass Index (BMI) 40.0 and over, adult: Secondary | ICD-10-CM | POA: Insufficient documentation

## 2023-09-24 DIAGNOSIS — G4733 Obstructive sleep apnea (adult) (pediatric): Secondary | ICD-10-CM | POA: Diagnosis not present

## 2023-09-24 DIAGNOSIS — Z794 Long term (current) use of insulin: Secondary | ICD-10-CM | POA: Insufficient documentation

## 2023-09-24 DIAGNOSIS — E785 Hyperlipidemia, unspecified: Secondary | ICD-10-CM | POA: Diagnosis not present

## 2023-09-24 DIAGNOSIS — E1140 Type 2 diabetes mellitus with diabetic neuropathy, unspecified: Secondary | ICD-10-CM | POA: Diagnosis not present

## 2023-09-24 DIAGNOSIS — I252 Old myocardial infarction: Secondary | ICD-10-CM | POA: Insufficient documentation

## 2023-09-24 DIAGNOSIS — E1169 Type 2 diabetes mellitus with other specified complication: Secondary | ICD-10-CM | POA: Diagnosis not present

## 2023-09-24 DIAGNOSIS — Z79899 Other long term (current) drug therapy: Secondary | ICD-10-CM | POA: Insufficient documentation

## 2023-09-24 DIAGNOSIS — Z7982 Long term (current) use of aspirin: Secondary | ICD-10-CM | POA: Diagnosis not present

## 2023-09-24 DIAGNOSIS — I5022 Chronic systolic (congestive) heart failure: Secondary | ICD-10-CM | POA: Diagnosis not present

## 2023-09-24 DIAGNOSIS — E669 Obesity, unspecified: Secondary | ICD-10-CM | POA: Insufficient documentation

## 2023-09-24 DIAGNOSIS — Z7984 Long term (current) use of oral hypoglycemic drugs: Secondary | ICD-10-CM | POA: Insufficient documentation

## 2023-09-24 DIAGNOSIS — I2510 Atherosclerotic heart disease of native coronary artery without angina pectoris: Secondary | ICD-10-CM | POA: Insufficient documentation

## 2023-09-24 LAB — BASIC METABOLIC PANEL
Anion gap: 10 (ref 5–15)
BUN: 22 mg/dL (ref 8–23)
CO2: 24 mmol/L (ref 22–32)
Calcium: 9.5 mg/dL (ref 8.9–10.3)
Chloride: 103 mmol/L (ref 98–111)
Creatinine, Ser: 0.99 mg/dL (ref 0.44–1.00)
GFR, Estimated: >60 mL/min (ref >60)
Glucose, Bld: 148 mg/dL — ABNORMAL HIGH (ref 70–99)
Potassium: 5.2 mmol/L — ABNORMAL HIGH (ref 3.5–5.1)
Sodium: 137 mmol/L (ref 135–145)

## 2023-09-24 LAB — LIPID PANEL
Cholesterol: 254 mg/dL — ABNORMAL HIGH (ref 0–200)
HDL: 45 mg/dL (ref >40)
LDL Cholesterol: 173 mg/dL — ABNORMAL HIGH (ref 0–99)
Total CHOL/HDL Ratio: 5.6 {ratio}
Triglycerides: 182 mg/dL — ABNORMAL HIGH (ref <150)
VLDL: 36 mg/dL (ref 0–40)

## 2023-09-24 LAB — AEROBIC CULTURE W GRAM STAIN (SUPERFICIAL SPECIMEN)
Culture: NO GROWTH — AB
Gram Stain: NONE SEEN

## 2023-09-24 LAB — BRAIN NATRIURETIC PEPTIDE: B Natriuretic Peptide: 33.3 pg/mL (ref 0.0–100.0)

## 2023-09-24 NOTE — Telephone Encounter (Signed)
-----   Message from Doylene Bode sent at 09/24/2023  9:26 AM EST ----- Please call and see how the patient is doing. She had a small drainage area on the left vulva. The culture returned with no strep or staph isolated with multiple organisms present but none predominant suggesting there was bacteria from the skin too on the culture. If doing well symptom wise from this area, would recommend continuing to monitor. ----- Message ----- From: Interface, Lab In [place] Sent: 09/20/2023  10:10 PM EST To: Doylene Bode, NP

## 2023-09-24 NOTE — Telephone Encounter (Signed)
Attempted to reach patient. Left voicemail requesting call back to [phone].

## 2023-09-24 NOTE — Telephone Encounter (Signed)
Spoke with Ms. Rud who returned call from the office. Pt states she is feeling well and the area on her left vulva has very minimal drainage that is not constant. Denies redness, pain and no fever or chills. Relayed message from Warner Mccreedy, NP that the culture returned no strep or staph isolated with multiple organisms present but none predominant suggesting there was bacteria from the skin too on the culture. Pt verbalized understanding. Advised patient to continue to monitor and call the office with any worsening symptoms or concerns.

## 2023-09-24 NOTE — Patient Instructions (Signed)
There has been no changes to your medications.  Labs done today, your results will be available in MyChart, we will contact you for abnormal readings.  Your physician has requested that you have an echocardiogram. Echocardiography is a painless test that uses sound waves to create images of your heart. It provides your doctor with information about the size and shape of your heart and how well your heart's chambers and valves are working. This procedure takes approximately one hour. There are no restrictions for this procedure. Please do NOT wear cologne, perfume, aftershave, or lotions (deodorant is allowed). Please arrive 15 minutes prior to your appointment time.  Please note: We ask at that you not bring children with you during ultrasound (echo/ vascular) testing. Due to room size and safety concerns, children are not allowed in the ultrasound rooms during exams. Our front office staff cannot provide observation of children in our lobby area while testing is being conducted. An adult accompanying a patient to their appointment will only be allowed in the ultrasound room at the discretion of the ultrasound technician under special circumstances. We apologize for any inconvenience.  [facility] have been [facility]. They will call you to arrange your appointment.  You have been referred to the lipid clinic for repeatha   Your physician recommends that you schedule a follow-up appointment in: 6 months

## 2023-09-25 NOTE — Progress Notes (Addendum)
PCP: Tollie Eth, NP  [facility] Cardiology: Dr. Shirlee Latch  68 y.o. with history of chronic systolic CHF, CAD, and MGUS was referred by Dr. Eldridge Dace for evaluation of CHF.  Patient first developed exertional dyspnea after her appendectomy in 2/19.  By 5/19, she was gaining weight and had developed a chronic cough. She never had chest pain.  Echo was done in 5/19 showing EF 25-30%, moderate MR, moderate RV dilation.  RHC/LHC was done in 6/19 showing elevated filling pressures, preserved cardiac output, and occluded mid LCx and distal RCA.  She was found to have IgM monoclonal antibody.  Skeletal survey showed no lytic lesions, likely MGUS.  Cardiac MRI in 8/19 showed EF 32%, subendocardial scar (consistent with prior MI) at inferior base, moderately dilated RV with RV EF 20%. No evidence for cardiac amyloidosis by MRI.  Repeat echo in 10/19 showed EF remaining 30-35%.  She was seen by Dr. Johney Frame and declined ICD.   Echo in 10/20 showed EF 40-45%.   She was unable to tolerate Entresto 97/103 due to lightheadedness.   Echo in 10/22 showed EF 50% with inferolateral hypokinesis, RV normal, IVC normal.   She has been diagnosed with uterine cancer and had cisplatin chemotherapy and radiation.  She had a hysterectomy.   Echo in 12/23 showed EF 50-55%, mild RV enlargement with normal RV systolic function.   She returns for followup of CHF.  Weight is down 14 lbs since last in this office.  She is taking semaglutide, gets from a friend.  She is chronically short of breath walking 50-100 feet.  She does some walking in a pool for exercise, has not been doing this as much as she would like.  No chest pain.  She continues to have neuropathic pain in her feet which is limiting.  No orthopnea, PND, palpitations.  She has OSA but is not using CPAP, following with Dr. Vassie Loll for this.   ECG (personally reviewed): NSR, poor RWP, nonspecific T wave changes.   Labs (5/19): transferrin saturation 20%, immunofixation with IgM  monoclonal antibody.  Labs (6/19): K 4.5, creatinine 0.79 Labs (7/19): K 4.1, creatinine 0.86, TSH normal Labs (9/19): K 4.4, creatinine 0.61 Labs (10/19): LDL 187 Labs (12/19): LDL 82 Labs (1/20): K 4, creatinine 0.81 Labs (8/20): K 4.4, creatinine 0.82, LDL 92 Labs (11/21): LDL 88, HDl 52, TGs 208 Labs (1/22): K 4.5, creatinine 1.02 Labs (5/22): K 4.2, creatinine 0.96 Labs( 5/23): K 4.5, creatinine 0.93, hgb 11.5 Labs (7/23): K 4.9, creatinine 0.97 Labs (9/24): LDL 89 Labs (12/24): K 5.2, creatinine 1.15  PMH: 1. Type II diabetes 2. OSA: Not currently on CPAP.  3. H/o appendectomy 4. 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  Skeletal survey in 7/19 showed no lytic lesions.  6. 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   - Echo (5/19): EF 25-30%, mild LV dilation, moderate MR, moderately dilated RV.  - LHC/RHC (6/19): distal RCA totally occluded, mid LCx totally occluded, EF < 25%. No intervention. Mean RA 10, mean PA 42, mean PCWP 29, CI 2.3.  - Cardiac MRI in 8/19 showed EF 32%, subendocardial scar (consistent with prior MI) at inferior base, moderately dilated RV with RV EF 20%. No evidence for cardiac amyloidosis by MRI.  - Echo (10/19): EF 30-35%.   - Echo (10/20): EF 40-45%, mild LVH, mild MR - Echo (10/22): EF 50% with inferolateral hypokinesis, RV normal, IVC normal. - Echo (12/23): EF 50-55%, mild RV enlargement with normal  RV systolic function 7. CAD: LHC (6/19) with distal RCA totally occluded, mid LCx totally occluded, EF < 25%. No intervention. 8. Hyperlipidemia: Has not tolerated statins.  9. ABIs (8/19): Normal.  10. COVID-19 infection in 9/22.   SH: Married, retired Engineer, civil (consulting), quit smoking in 1991.    FH: Mother with rheumatic MV disease, s/p MVR.   ROS: All systems reviewed and negative except as per HPI.   Current Outpatient Medications  Medication Sig Dispense Refill   acetaminophen (TYLENOL) 500 MG  tablet Take 500-1,000 mg by mouth every 6 (six) hours as needed for mild pain or moderate pain.     albuterol (PROAIR HFA) 108 (90 Base) MCG/ACT inhaler Inhale 2 puffs into the lungs every 6 (six) hours as needed. wheezing 18 g 2   aspirin 81 MG chewable tablet Chew 1 tablet (81 mg total) by mouth daily. 90 tablet 3   blood glucose meter kit and supplies Use up to four times daily as directed. (FOR ICD-10 E10.9, E11.9). 1 each 99   carvedilol (COREG) 6.25 MG tablet Take 1 tablet (6.25 mg total) by mouth 2 (two) times daily. 180 tablet 1   diclofenac Sodium (VOLTAREN) 1 % GEL Apply 2 g topically as needed.     Evolocumab (REPATHA SURECLICK) 140 MG/ML SOAJ Inject 140 mg into the skin every 14 (fourteen) days. 6 mL 3   furosemide (LASIX) 20 MG tablet Take 1 tablet (20 mg total) by mouth daily. 90 tablet 1   glucose blood test strip Use as directed to test blood sugar 100 each PRN   insulin glargine (LANTUS) 100 UNIT/ML Solostar Pen Inject 25-40 Units into the skin daily. 45 mL 3   levothyroxine (SYNTHROID) 50 MCG tablet Take 1 tablet (50 mcg total) by mouth daily, before a meal. 90 tablet 1   MAGNESIUM GLYCINATE PO Take 1 tablet by mouth daily.     metFORMIN (GLUCOPHAGE) 1000 MG tablet Take 1 tablet (1,000 mg total) by mouth 2 (two) times daily with a meal. 180 tablet 1   Multiple Vitamin (MULITIVITAMIN WITH MINERALS) TABS Take 1 tablet by mouth daily with breakfast.     naproxen sodium (ALEVE) 220 MG tablet Take 440 mg by mouth daily as needed (pain).     OneTouch Delica Lancets 33G MISC use as directed 100 each 1   sacubitril-valsartan (ENTRESTO) 24-26 MG Take 1 tablet by mouth 2 (two) times daily. 180 tablet 1   spironolactone (ALDACTONE) 25 MG tablet Take 1 tablet (25 mg total) by mouth daily. 90 tablet 1   triamcinolone ointment (KENALOG) 0.1 % Apply 1 application topically two times daily to affected area(s) as needed, sparing use to avoid whitening/thinning skin 30 g 1   No current  facility-administered medications for this encounter.   BP 104/70   Pulse 93   Wt 126.2 kg (278 lb 3.2 oz)   SpO2 98%   BMI 43.57 kg/m  General: NAD, obese.  Neck: No JVD, no thyromegaly or thyroid nodule.  Lungs: Clear to auscultation bilaterally with normal respiratory effort. CV: Nondisplaced PMI.  Heart regular S1/S2, no S3/S4, no murmur.  Trace ankle edema.  No carotid bruit.  Normal pedal pulses.  Abdomen: Soft, nontender, no hepatosplenomegaly, no distention.  Skin: Intact without lesions or rashes.  Neurologic: Alert and oriented x 3.  Psych: Normal affect. Extremities: No clubbing or cyanosis.  HEENT: Normal.   Assessment/Plan: 1. Chronic systolic CHF: Echo in 5/19 with EF 25-30%.  She had occluded mid LCx and  distal RCA on coronary angiography.  Suspect ischemic cardiomyopathy.  She also has a monoclonal antibody.  Cardiac MRI in 8/19 showed LV EF 32% and RV EF 30%, subendocardial scar at inferior base consistent with prior MI, no evidence for cardiac amyloidosis.  Repeat echo in 10/19 showed EF 30-35%.  Patient saw Dr. Johney Frame and declined ICD.  However, echo in 10/20 showed EF up to 40-45% and out of ICD range. Echo in 10/22 showed EF 50% with inferolateral hypokinesis, RV normal, IVC normal.  Echo in 12/23 was stable with EF 50-55%.  NYHA class II-III symptoms, not volume overloaded on exam.  Symptoms confounded by obesity/deconditioning and neuropathic pain.    - Continue Entresto 24/26 bid, decreased in the past with orthostatic symptoms.  - Continue spironolactone 25 mg daily.  - Continue Coreg 6.25 mg bid.  - No SGLT2 inhibitor given history of severe GU infections (labial cellulitis most recently).    - Continue Lasix 20 mg daily, BMET/BNP today.  - I will arrange for echo.  2. CAD: Occluded mLCx and dRCA on cath in 6/19.  No interventional target.   - Continue ASA 81 daily.  - She has been taking Repatha due to cramps with Crestor.  Check lipids today, goal LDL < 55.   3. Obesity:  Work on diet and exercise.  Weight trending down, has been getting semaglutide from a friend.  - Refer to pharmacy clinic to see if she could get semaglutide or tirzepatide through her insurance this year.  She has diabetes.   Followup 6 months with APP if echo is stable.   Marca Ancona 09/25/2023

## 2023-09-26 ENCOUNTER — Other Ambulatory Visit (HOSPITAL_BASED_OUTPATIENT_CLINIC_OR_DEPARTMENT_OTHER): Payer: Self-pay

## 2023-09-27 ENCOUNTER — Other Ambulatory Visit: Payer: Medicare HMO

## 2023-09-27 NOTE — Progress Notes (Signed)
   09/27/2023  Patient ID: Terri Heath, female   DOB: [birth year], 68 y.o.   MRN: [id number]  Contacted patient to follow up on PAP. Patient has not received applications in the mail yet as requested. Will notify us if they have not arrived by next Wed so they can be re-mailed or if she prefers to come by in office at that point.  Patient instructed to call with any other concerns.  Sherrill Raring, PharmD Clinical Pharmacist [phone]

## 2023-10-04 ENCOUNTER — Ambulatory Visit
Admission: RE | Admit: 2023-10-04 | Discharge: 2023-10-04 | Disposition: A | Discharge status: Home / Self Care | Payer: Medicare HMO | Source: Ambulatory Visit | Attending: Nurse Practitioner | Admitting: Nurse Practitioner

## 2023-10-04 DIAGNOSIS — Z1231 Encounter for screening mammogram for malignant neoplasm of breast: Secondary | ICD-10-CM | POA: Diagnosis not present

## 2023-10-05 ENCOUNTER — Other Ambulatory Visit: Payer: Self-pay | Admitting: Nurse Practitioner

## 2023-10-05 ENCOUNTER — Encounter: Payer: Self-pay | Admitting: Nurse Practitioner

## 2023-10-05 ENCOUNTER — Other Ambulatory Visit: Payer: Self-pay

## 2023-10-05 ENCOUNTER — Encounter (HOSPITAL_COMMUNITY): Payer: Self-pay

## 2023-10-05 ENCOUNTER — Other Ambulatory Visit (HOSPITAL_COMMUNITY): Payer: Self-pay

## 2023-10-08 ENCOUNTER — Other Ambulatory Visit: Payer: Self-pay

## 2023-10-08 ENCOUNTER — Other Ambulatory Visit (HOSPITAL_COMMUNITY): Payer: Self-pay

## 2023-10-08 ENCOUNTER — Telehealth: Payer: Self-pay

## 2023-10-08 MED ORDER — DICLOFENAC SODIUM 1 % EX GEL
2.0000 g | CUTANEOUS | 3 refills | Status: DC | PRN
  Filled 2023-10-08: qty 300, 38d supply, fill #0

## 2023-10-08 NOTE — Progress Notes (Signed)
 Pharmacy Medication Assistance Program Note    10/08/2023  Patient ID: [name], female   DOB: [birth year], 68 y.o.   MRN: [id number]     10/08/2023  Outreach Medication One  Manufacturer Medication One Novo Nordisk  Nordisk Drugs Ozempic   Dose of Ozempic  0.5  Type of Sport and exercise psychologist  Patient Assistance Determination Approved  Approval Start Date 10/05/2023  Approval End Date 08/27/2024     Signature

## 2023-10-08 NOTE — Telephone Encounter (Signed)
 PAP: Patient assistance application for Ozempic  has been approved by PAP Companies: NovoNordisk from 10/05/2023 to 10/08/2023. Medication should be delivered to PAP Delivery: Provider's office. For further shipping updates, please contact Novo Nordisk at [phone]. Patient ID is: [id number]

## 2023-10-09 ENCOUNTER — Other Ambulatory Visit: Payer: Self-pay

## 2023-10-09 ENCOUNTER — Other Ambulatory Visit (HOSPITAL_COMMUNITY): Payer: Self-pay

## 2023-10-10 ENCOUNTER — Other Ambulatory Visit: Payer: Self-pay | Admitting: Nurse Practitioner

## 2023-10-10 ENCOUNTER — Other Ambulatory Visit (HOSPITAL_COMMUNITY): Payer: Self-pay

## 2023-10-10 ENCOUNTER — Other Ambulatory Visit: Payer: Self-pay

## 2023-10-10 DIAGNOSIS — E1165 Type 2 diabetes mellitus with hyperglycemia: Secondary | ICD-10-CM

## 2023-10-10 DIAGNOSIS — E1159 Type 2 diabetes mellitus with other circulatory complications: Secondary | ICD-10-CM

## 2023-10-10 MED ORDER — LANTUS SOLOSTAR 100 UNIT/ML ~~LOC~~ SOPN
25.0000 [IU] | PEN_INJECTOR | Freq: Every day | SUBCUTANEOUS | 3 refills | Status: DC
Start: 2023-10-10 — End: 2023-12-03
  Filled 2023-10-10: qty 36, 90d supply, fill #0
  Filled 2023-10-22: qty 45, 90d supply, fill #0

## 2023-10-11 ENCOUNTER — Ambulatory Visit (HOSPITAL_COMMUNITY)
Admission: RE | Admit: 2023-10-11 | Discharge: 2023-10-11 | Disposition: A | Discharge status: Home / Self Care | Payer: Medicare HMO | Source: Ambulatory Visit | Attending: Nurse Practitioner

## 2023-10-11 ENCOUNTER — Other Ambulatory Visit (HOSPITAL_COMMUNITY): Payer: Self-pay

## 2023-10-11 ENCOUNTER — Telehealth: Payer: Self-pay

## 2023-10-11 DIAGNOSIS — I110 Hypertensive heart disease with heart failure: Secondary | ICD-10-CM | POA: Insufficient documentation

## 2023-10-11 DIAGNOSIS — I5022 Chronic systolic (congestive) heart failure: Secondary | ICD-10-CM

## 2023-10-11 DIAGNOSIS — E785 Hyperlipidemia, unspecified: Secondary | ICD-10-CM | POA: Insufficient documentation

## 2023-10-11 DIAGNOSIS — G4730 Sleep apnea, unspecified: Secondary | ICD-10-CM | POA: Diagnosis not present

## 2023-10-11 DIAGNOSIS — I509 Heart failure, unspecified: Secondary | ICD-10-CM | POA: Insufficient documentation

## 2023-10-11 LAB — ECHOCARDIOGRAM COMPLETE
Area-P 1/2: 5.97 cm2
S' Lateral: 2.9 cm

## 2023-10-11 MED ORDER — PERFLUTREN LIPID MICROSPHERE
1.0000 mL | INTRAVENOUS | Status: AC | PRN
  Administered 2023-10-11: 1 mL via INTRAVENOUS

## 2023-10-11 NOTE — Telephone Encounter (Signed)
Call pt back to let her know the process on her Sonofi application, Sonofi is still looking into December application at this time once they get to her application they will send a letter to the Dr office on any decision.pt is aware.

## 2023-10-11 NOTE — Telephone Encounter (Signed)
Following up on pt pap Sonofi (Lantus)spoke with pt explain she was not aware of Sonofi application,she has a prescription ready at the pharmacy ready to pick up but has a copay of $35, RPH Marylene Land explain she submitted an application but has not heard anything back from [employer],she call Sonofi 2 weeks ago,Gave Sonofi a call to see if pt's application has been approved,[employer] said application was submitted on 2/13 and are still working from the ones from December that they will send a letter to Dr office with the outcome of the decision they take for this application.

## 2023-10-12 ENCOUNTER — Telehealth: Payer: Self-pay | Admitting: Pharmacist

## 2023-10-12 ENCOUNTER — Ambulatory Visit: Payer: Medicare HMO | Attending: Cardiovascular Disease | Admitting: Student

## 2023-10-12 ENCOUNTER — Telehealth: Payer: Self-pay

## 2023-10-12 ENCOUNTER — Telehealth: Payer: Self-pay | Admitting: Pharmacy Technician

## 2023-10-12 ENCOUNTER — Other Ambulatory Visit (HOSPITAL_BASED_OUTPATIENT_CLINIC_OR_DEPARTMENT_OTHER): Payer: Self-pay

## 2023-10-12 ENCOUNTER — Encounter: Payer: Self-pay | Admitting: Student

## 2023-10-12 ENCOUNTER — Encounter: Payer: Self-pay | Admitting: Pharmacy Technician

## 2023-10-12 ENCOUNTER — Other Ambulatory Visit (HOSPITAL_COMMUNITY): Payer: Self-pay

## 2023-10-12 VITALS — Ht 67.0 in | Wt 273.3 lb

## 2023-10-12 DIAGNOSIS — E785 Hyperlipidemia, unspecified: Secondary | ICD-10-CM

## 2023-10-12 DIAGNOSIS — E1169 Type 2 diabetes mellitus with other specified complication: Secondary | ICD-10-CM | POA: Diagnosis not present

## 2023-10-12 NOTE — Telephone Encounter (Signed)
   I got the pharmacy to fill and I sent patient a message

## 2023-10-12 NOTE — Telephone Encounter (Signed)
Pharmacy Patient Advocate Encounter   Received notification from Pt Calls Messages that prior authorization for mounjaro is required/requested.   Insurance verification completed.   The patient is insured through [place] .   Per test claim: The current 10/12/23 day co-pay is, $297.00- one month (DEDUCTIBLE).  No PA needed at this time. This test claim was processed through [facility]- copay amounts may vary at other pharmacies due to pharmacy/plan contracts, or as the patient moves through the different stages of their insurance plan.    PA on file 08/29/23--08/27/24

## 2023-10-12 NOTE — Telephone Encounter (Signed)
Pharmacy Patient Advocate Encounter   Received notification from Pt Calls Messages that prior authorization for ozempic is required/requested.   Insurance verification completed.   The patient is insured through [place] .   Per test claim: The current 10/12/23 day co-pay is, $297.00- one month.  No PA needed at this time. This test claim was processed through [facility]- copay amounts may vary at other pharmacies due to pharmacy/plan contracts, or as the patient moves through the different stages of their insurance plan.    Pa on file 09/04/23--08/27/24

## 2023-10-12 NOTE — Telephone Encounter (Signed)
Patient Advocate Encounter   The patient was approved for a [employer] grant that will help cover the cost of ENTRESTO Total amount awarded, $10,000.  Effective: 08/05/23 - 08/03/24   [id number] [id number]   Patient informed via Dorcas Carrow, CPhT  Pharmacy Patient Advocate Specialist  Direct Number: [phone] Fax: [phone]

## 2023-10-12 NOTE — Telephone Encounter (Signed)
Patient still has an active grant. Sent pt [employer] with Estate agent.

## 2023-10-12 NOTE — Progress Notes (Signed)
Patient ID: Terri Heath                 DOB: Apr 08, 1956                    MRN: [id number]     HPI: Terri Heath is a 68 y.o. female patient referred to pharmacy clinic by Dr.McLean to initiate GLP1-RA therapy. PMH is significant for hypertension, T2DM, HLD, HF, statin myopathy, OSA, and obesity. Most recent BMI 43.66 kg/m .  Baseline weight and BMI: 47.16 kg/m  Current weight and BMI: 43.66 kg/m  Current meds that affect weight: insulin    Patient presented today for weight loss consultation. Last year she was on Ozempic but due to high co-pay she went off of it. She tolerated that well. She is not happy with Repatha devices. It is costly too. Reports she missed few doses of it in Dec and jan due to faulty device that is why her lab in Jan had high LDLc . She is not ready to go on any other pills until she tries Repatha continuously.   For Ozempic her PCP started the PA and PAP. She has been approved for both. We will assess coverage and PAP eligibility for Terri Heath as it has better BG control and weight loss data compare to Ozempic. Also we will enroll her in Terri Heath grant so that her Repatha be more affordable    Terri Heath is cost prohibitive - re enrollment  to Terri Heath grant request sent to the med assistance pool  Diet: reports diabetic for 25 years knows what to eat and what not to. Also was on Ozempic last year so familiar to stay away from processed,fatty,high carb food to avoid upset stomach   Exercise:  None due to chronic pain      Labs: Lab Results  Component Value Date   HGBA1C 7.5 (H) 08/27/2023    Wt Readings from Last 1 Encounters:  09/24/23 278 lb 3.2 oz (126.2 kg)    BP Readings from Last 1 Encounters:  09/24/23 104/70   Pulse Readings from Last 1 Encounters:  09/24/23 93       Component Value Date/Time   CHOL 254 (H) 09/24/2023 1617   CHOL 181 05/03/2023 1210   TRIG 182 (H) 09/24/2023 1617   HDL 45 09/24/2023 1617   HDL 53 05/03/2023 1210    CHOLHDL 5.6 09/24/2023 1617   VLDL 36 09/24/2023 1617   LDLCALC 173 (H) 09/24/2023 1617   LDLCALC 89 05/03/2023 1210    Past Medical History:  Diagnosis Date   Abnormal vaginal bleeding in postmenopausal patient 10/11/2021   Acute systolic heart failure (HCC)    Allergy    Asthma    Bilateral shoulder pain    Borderline hypertension    Bronchitis    CAD (coronary artery disease)    Chronic systolic CHF (congestive heart failure) (HCC)    Chronic systolic congestive heart failure, NYHA class 3 (HCC) 07/24/2018   Closed fracture of distal fibula 07/17/2022   Dehydration 12/20/2021   Diabetes mellitus    Diagnosed in 2000, on insulin, Trulicity and metformin, not checking cgs at home   Drug-induced hypotension 12/20/2021   Elevated serum creatinine 12/20/2021   Encounter to establish care 01/06/2021   Gangrenous appendicitis with perforation s/p lap appendectomy 10/04/2017 10/04/2017   GERD (gastroesophageal reflux disease)    History of MI (myocardial infarction)    History of radiation therapy    Uterus- 12/14/21-02/01/22- Dr. Fayrene Fearing  [place]   Hyperlipidemia    Hypertension    Hypothyroidism    MGUS (monoclonal gammopathy of unknown significance)    Mitral regurgitation    Myocardial infarction (HCC)    Neuropathy    Obesity    Rash of back 01/06/2021   Sleep apnea    uses CPAP   Statin intolerance    Uterine cancer (HCC)     Current Outpatient Medications on File Prior to Visit  Medication Sig Dispense Refill   acetaminophen (TYLENOL) 500 MG tablet Take 500-1,000 mg by mouth every 6 (six) hours as needed for mild pain or moderate pain.     albuterol (PROAIR HFA) 108 (90 Base) MCG/ACT inhaler Inhale 2 puffs into the lungs every 6 (six) hours as needed. wheezing 18 g 2   aspirin 81 MG chewable tablet Chew 1 tablet (81 mg total) by mouth daily. 90 tablet 3   blood glucose meter kit and supplies Use up to four times daily as directed. (FOR ICD-10 E10.9, E11.9). 1 each 99    carvedilol (COREG) 6.25 MG tablet Take 1 tablet (6.25 mg total) by mouth 2 (two) times daily. 180 tablet 1   diclofenac Sodium (VOLTAREN) 1 % GEL Apply 2 g topically as needed. 350 g 3   Evolocumab (REPATHA SURECLICK) 140 MG/ML SOAJ Inject 140 mg into the skin every 14 (fourteen) days. 6 mL 3   furosemide (LASIX) 20 MG tablet Take 1 tablet (20 mg total) by mouth daily. 90 tablet 1   glucose blood test strip Use as directed to test blood sugar 100 each PRN   insulin glargine (LANTUS SOLOSTAR) 100 UNIT/ML Solostar Pen Inject 25-40 Units into the skin daily. 45 mL 3   levothyroxine (SYNTHROID) 50 MCG tablet Take 1 tablet (50 mcg total) by mouth daily, before a meal. 90 tablet 1   MAGNESIUM GLYCINATE PO Take 1 tablet by mouth daily.     metFORMIN (GLUCOPHAGE) 1000 MG tablet Take 1 tablet (1,000 mg total) by mouth 2 (two) times daily with a meal. 180 tablet 1   Multiple Vitamin (MULITIVITAMIN WITH MINERALS) TABS Take 1 tablet by mouth daily with breakfast.     naproxen sodium (ALEVE) 220 MG tablet Take 440 mg by mouth daily as needed (pain).     OneTouch Delica Lancets 33G MISC use as directed 100 each 1   sacubitril-valsartan (ENTRESTO) 24-26 MG Take 1 tablet by mouth 2 (two) times daily. 180 tablet 1   spironolactone (ALDACTONE) 25 MG tablet Take 1 tablet (25 mg total) by mouth daily. 90 tablet 1   triamcinolone ointment (KENALOG) 0.1 % Apply 1 application topically two times daily to affected area(s) as needed, sparing use to avoid whitening/thinning skin 30 g 1   [DISCONTINUED] insulin detemir (LEVEMIR) 100 UNIT/ML FlexPen Inject 10 Units into the skin daily. 15 mL 11   No current facility-administered medications on file prior to visit.    Allergies  Allergen Reactions   Oxycodone Hcl Other (See Comments)    Severe hallucinations   Amoxicillin Other (See Comments)    Dizziness, abdominal pain   Adhesive [Tape] Rash    Tegaderm   Exenatide Other (See Comments)    Flu-like symptoms    Invokana [Canagliflozin] Other (See Comments)    Yeast infection/dehydration   Jardiance [Empagliflozin] Other (See Comments)    Yeast infections and dehydration   Lisinopril Other (See Comments)    cramping   Other Diarrhea and Other (See Comments)    Kale=food  Per  patient, it causes GI bleeding    Statins Other (See Comments)    Leg cramps     Assessment/Plan: Hyperlipidemia:  Intolerance to various statins due to myalgia. Currently on Repatha. Missed few doses before last lipid lab in Jan so her LDL is elevated. Co- pay for  Repatha is around $300 so it is cost prohibitive. We will enroll her in the grant to make it cost effective. Wants to take Repatha for 2-3 months before we add Zetia or nexletol  Her LDL goal <55 (risk factors: obesity, poorly controlled diabetes, HTN, CHF)  Will get NMR and Apo b to get accurate LDL in 2-3 months   Weight loss /T2DM- Patient has not met goal of at least 5% of body weight loss with comprehensive lifestyle modifications alone in the past 3-6 months.  Last year patient was on Ozempic. Stopped it due to cost.  PCP has PA and PAP approved for Ozempic but we will assess coverage and PAP for Mounjaro.  Pharmacotherapy is appropriate to pursue as augmentation. Confirmed patient has no personal or family history of medullary thyroid carcinoma (MTC) or Multiple Endocrine Neoplasia syndrome type 2 (MEN 2). Injection technique reviewed at today's visit.  Advised patient on common side effects including nausea, diarrhea, dyspepsia, decreased appetite, and fatigue. Counseled patient on reducing meal size and how to titrate medication to minimize side effects. Counseled patient to call if intolerable side effects or if experiencing dehydration, abdominal pain, or dizziness. Patient will adhere to dietary modifications and will target at least 150 minutes of moderate intensity exercise weekly.   Follow up in 1 month via telephone for tolerability update and dose  titration.

## 2023-10-14 ENCOUNTER — Other Ambulatory Visit (HOSPITAL_COMMUNITY): Payer: Self-pay

## 2023-10-15 ENCOUNTER — Other Ambulatory Visit (HOSPITAL_COMMUNITY): Payer: Self-pay

## 2023-10-15 ENCOUNTER — Other Ambulatory Visit (HOSPITAL_BASED_OUTPATIENT_CLINIC_OR_DEPARTMENT_OTHER): Payer: Self-pay

## 2023-10-15 ENCOUNTER — Other Ambulatory Visit: Payer: Self-pay

## 2023-10-15 NOTE — Telephone Encounter (Signed)
Spoke to patient   Terri Heath is cost prohibitive with out PAP due to high deductible. PCP has PA and PAP for Ozempic in place will continue with that treatment.   Enrolled in [facility] for [place]. Will repeat lipid lab in 3 months to see LDLc level and optimize therapy if LDLc still remains above goal.

## 2023-10-16 ENCOUNTER — Other Ambulatory Visit (HOSPITAL_BASED_OUTPATIENT_CLINIC_OR_DEPARTMENT_OTHER): Payer: Self-pay

## 2023-10-18 ENCOUNTER — Encounter: Payer: Self-pay | Admitting: Nurse Practitioner

## 2023-10-18 NOTE — Telephone Encounter (Signed)
PCP is managing Ozempic PA and PAP

## 2023-10-18 NOTE — Telephone Encounter (Signed)
Kennedy Bucker info shared with pt.

## 2023-10-22 ENCOUNTER — Other Ambulatory Visit (HOSPITAL_BASED_OUTPATIENT_CLINIC_OR_DEPARTMENT_OTHER): Payer: Self-pay

## 2023-10-22 ENCOUNTER — Other Ambulatory Visit (HOSPITAL_COMMUNITY): Payer: Self-pay

## 2023-10-25 ENCOUNTER — Telehealth: Payer: Self-pay | Admitting: Nurse Practitioner

## 2023-10-25 NOTE — Telephone Encounter (Signed)
 Pt is calling to report that she will pick up her ozempic when she comes in for her appt on 11/01/23.

## 2023-10-25 NOTE — Telephone Encounter (Signed)
 Pt's Ozempic was received, called pt and informed

## 2023-11-01 ENCOUNTER — Encounter: Payer: Medicare HMO | Admitting: Nurse Practitioner

## 2023-11-05 ENCOUNTER — Telehealth: Payer: Self-pay

## 2023-11-05 NOTE — Telephone Encounter (Signed)
 Received APPROVAL letter form [employer] it will be index to media.

## 2023-11-06 NOTE — Telephone Encounter (Signed)
 Following up on pt Sonofi application gave company a call they said they are still working on application from January and once they have an answer for the pt they will send a letter to pt and provider.

## 2023-11-12 ENCOUNTER — Ambulatory Visit: Payer: Medicare HMO

## 2023-11-30 ENCOUNTER — Telehealth: Payer: Self-pay | Admitting: Nurse Practitioner

## 2023-11-30 NOTE — Telephone Encounter (Signed)
 PAP OZEMPIC received, left message for pt

## 2023-12-03 ENCOUNTER — Other Ambulatory Visit: Payer: Self-pay

## 2023-12-03 ENCOUNTER — Encounter: Payer: Self-pay | Admitting: Nurse Practitioner

## 2023-12-03 ENCOUNTER — Other Ambulatory Visit: Payer: Self-pay | Admitting: Nurse Practitioner

## 2023-12-03 ENCOUNTER — Other Ambulatory Visit (HOSPITAL_COMMUNITY): Payer: Self-pay

## 2023-12-03 DIAGNOSIS — E1165 Type 2 diabetes mellitus with hyperglycemia: Secondary | ICD-10-CM

## 2023-12-03 DIAGNOSIS — E1159 Type 2 diabetes mellitus with other circulatory complications: Secondary | ICD-10-CM

## 2023-12-03 MED ORDER — ONETOUCH DELICA LANCETS 33G MISC
Freq: Four times a day (QID) | 1 refills | Status: DC
  Filled 2023-12-03 – 2023-12-04 (×2): qty 100, 25d supply, fill #0

## 2023-12-03 MED ORDER — GLUCOSE BLOOD VI STRP
ORAL_STRIP | 99 refills | Status: DC
  Filled 2023-12-03 – 2023-12-04 (×2): qty 100, 25d supply, fill #0
  Filled 2024-01-14: qty 100, 25d supply, fill #1
  Filled 2024-04-01 – 2024-08-01 (×2): qty 100, 25d supply, fill #2

## 2023-12-03 MED ORDER — BD PEN NEEDLE NANO 2ND GEN 32G X 4 MM MISC
Freq: Every day | 3 refills | Status: AC
Start: 2023-12-03 — End: 2024-12-02
  Filled 2023-12-03: qty 100, 100d supply, fill #0

## 2023-12-03 MED ORDER — LANTUS SOLOSTAR 100 UNIT/ML ~~LOC~~ SOPN
25.0000 [IU] | PEN_INJECTOR | Freq: Every day | SUBCUTANEOUS | 3 refills | Status: AC
  Filled 2023-12-03 – 2023-12-13 (×2): qty 36, 90d supply, fill #0
  Filled 2023-12-27: qty 45, 112d supply, fill #0

## 2023-12-04 ENCOUNTER — Encounter: Payer: Self-pay | Admitting: Nurse Practitioner

## 2023-12-04 ENCOUNTER — Other Ambulatory Visit (HOSPITAL_COMMUNITY): Payer: Self-pay

## 2023-12-04 ENCOUNTER — Ambulatory Visit (INDEPENDENT_AMBULATORY_CARE_PROVIDER_SITE_OTHER): Admitting: Nurse Practitioner

## 2023-12-04 VITALS — BP 120/72 | HR 78 | Wt 273.4 lb

## 2023-12-04 DIAGNOSIS — R197 Diarrhea, unspecified: Secondary | ICD-10-CM | POA: Diagnosis not present

## 2023-12-04 DIAGNOSIS — D472 Monoclonal gammopathy: Secondary | ICD-10-CM | POA: Diagnosis not present

## 2023-12-04 DIAGNOSIS — J4520 Mild intermittent asthma, uncomplicated: Secondary | ICD-10-CM | POA: Diagnosis not present

## 2023-12-04 DIAGNOSIS — I5022 Chronic systolic (congestive) heart failure: Secondary | ICD-10-CM | POA: Diagnosis not present

## 2023-12-04 DIAGNOSIS — E1159 Type 2 diabetes mellitus with other circulatory complications: Secondary | ICD-10-CM

## 2023-12-04 DIAGNOSIS — E1169 Type 2 diabetes mellitus with other specified complication: Secondary | ICD-10-CM

## 2023-12-04 DIAGNOSIS — E1165 Type 2 diabetes mellitus with hyperglycemia: Secondary | ICD-10-CM

## 2023-12-04 DIAGNOSIS — I152 Hypertension secondary to endocrine disorders: Secondary | ICD-10-CM | POA: Diagnosis not present

## 2023-12-04 DIAGNOSIS — E785 Hyperlipidemia, unspecified: Secondary | ICD-10-CM | POA: Diagnosis not present

## 2023-12-04 DIAGNOSIS — D61818 Other pancytopenia: Secondary | ICD-10-CM

## 2023-12-04 DIAGNOSIS — Z8542 Personal history of malignant neoplasm of other parts of uterus: Secondary | ICD-10-CM

## 2023-12-04 MED ORDER — ALBUTEROL SULFATE HFA 108 (90 BASE) MCG/ACT IN AERS
2.0000 | INHALATION_SPRAY | Freq: Four times a day (QID) | RESPIRATORY_TRACT | 2 refills | Status: AC | PRN
  Filled 2023-12-04: qty 13.4, 50d supply, fill #0

## 2023-12-04 NOTE — Assessment & Plan Note (Signed)
 Excellent blood pressure today with no concerns. Continue current treatment.

## 2023-12-04 NOTE — Assessment & Plan Note (Signed)
 MGUS is being monitored with no indication of progression to a more serious condition at this time. She is aware of the condition and its implications. - Continue monitoring MGUS with regular follow-ups as needed.

## 2023-12-04 NOTE — Assessment & Plan Note (Signed)
 Managed with repatha. Excellent exercise management with improved stamina. Labs pending.

## 2023-12-04 NOTE — Assessment & Plan Note (Signed)
 Suspect related to MGUS. WIll monitor

## 2023-12-04 NOTE — Assessment & Plan Note (Signed)
 Working on diet and exercise with improved stamina with exercise. Continue regimen.

## 2023-12-04 NOTE — Assessment & Plan Note (Signed)
 She had a hysterectomy and is concerned about the recurrence of gynecological issues. Advised to continue regular follow-ups with her gynecologist. Discussed the potential need for surveillance tests, such as CT scans, but no specific plan was made during this visit. Aware of the signs and symptoms to watch for concerning recurrence. The CA125 test was discussed but deemed unnecessary as there is no remaining endometrial tissue. - Continue regular follow-ups with the gynecologist as recommended. - Discuss with the gynecologist the need for any additional surveillance tests, such as CT scans.

## 2023-12-04 NOTE — Assessment & Plan Note (Signed)
 She is on Ozempic (semaglutide) for diabetes management. An attempt to increase the dose resulted in nausea, so she will maintain a lower dose to avoid gastrointestinal side effects. She is also on insulin, with a goal to taper off if blood glucose control improves. The potential to switch to [facility] (tirzepatide) was discussed, but it is currently cost-prohibitive. Mounjaro may offer better outcomes and less nausea for some patients. She has been diabetic for 25 years and is actively managing her condition with lifestyle modifications, including swimming and dietary adjustments. She is aware of the challenges of diabetes management and is committed to maintaining her health. - Continue Ozempic at the current lower dose to minimize nausea. - Monitor blood glucose levels to assess the possibility of tapering insulin. - Encourage continued lifestyle modifications, including swimming and dietary management.

## 2023-12-04 NOTE — Patient Instructions (Signed)
 Keep on the 0.25mg  dose of Ozempic for a few more weeks and we can always try to increase a little later.   You are doing WONDERFUL!! I am so proud of you!!

## 2023-12-04 NOTE — Progress Notes (Signed)
 [name], DNP, AGNP-c [facility]  [address] [place], [place] [address] [phone]  ESTABLISHED PATIENT- Chronic Health and/or Follow-Up Visit  Blood pressure 120/72, pulse 78, weight 273 lb 6.4 oz (124 kg).    Terri Heath is a 68 y.o. year old female presenting today for evaluation and management of chronic conditions.   History of Present Illness The patient, with type 2 diabetes mellitus, presents with nausea related to Ozempic use.  She experiences nausea after increasing the dose of Ozempic, which led her to reduce the dose back to a lower level. The nausea affects her ability to engage in daily activities such as swimming and spending time with family outdoors. She has been on Ozempic for four weeks.  She has a history of diabetes and faces challenges in managing her condition, including the impact of insulin as a 'fat storing hormone'. She is currently on insulin and Ozempic, with a goal to potentially reduce insulin use if blood sugar control improves. She has been diabetic for 25 years and is mindful of her diet, mentioning specific foods she consumes.  She is actively trying to increase her physical activity, including swimming and walking, and notes improvement in her mobility and balance. She describes being able to walk from the parking lot to the pool more quickly and with less difficulty than before. She also enjoys outdoor activities with her family, indicating a positive impact on her quality of life.  She mentions a past hysterectomy and is concerned about the recurrence of any related conditions. She is under surveillance with regular follow-ups every three months for two to three years post-operation. She is aware of symptoms that could indicate recurrence, such as unexpected weight loss and fatigue, but currently does not experience these symptoms.  She has a history of monoclonal gammopathy of unknown significance (MGUS) and is aware  of the potential implications, although she does not report any current symptoms related to this condition.  All ROS negative with exception of what is listed above.   PHYSICAL EXAM Physical Exam Vitals and nursing note reviewed.  Constitutional:      Appearance: Normal appearance. She is obese.  HENT:     Head: Normocephalic.  Eyes:     Conjunctiva/sclera: Conjunctivae normal.  Neck:     Vascular: No carotid bruit.  Cardiovascular:     Rate and Rhythm: Normal rate and regular rhythm.     Pulses: Normal pulses.     Heart sounds: Normal heart sounds.  Pulmonary:     Effort: Pulmonary effort is normal.     Breath sounds: Normal breath sounds.  Abdominal:     Palpations: Abdomen is soft.  Musculoskeletal:     Right lower leg: No edema.     Left lower leg: No edema.  Skin:    General: Skin is warm and dry.     Capillary Refill: Capillary refill takes less than 2 seconds.  Neurological:     Mental Status: She is alert and oriented to person, place, and time.  Psychiatric:        Mood and Affect: Mood normal.        Behavior: Behavior normal.      PLAN Problem List Items Addressed This Visit     Poorly controlled type 2 diabetes mellitus with circulatory disorder (HCC) - Primary   She is on Ozempic (semaglutide) for diabetes management. An attempt to increase the dose resulted in nausea, so she will maintain a lower dose to avoid  gastrointestinal side effects. She is also on insulin, with a goal to taper off if blood glucose control improves. The potential to switch to [facility] (tirzepatide) was discussed, but it is currently cost-prohibitive. Mounjaro may offer better outcomes and less nausea for some patients. She has been diabetic for 25 years and is actively managing her condition with lifestyle modifications, including swimming and dietary adjustments. She is aware of the challenges of diabetes management and is committed to maintaining her health. - Continue Ozempic at the  current lower dose to minimize nausea. - Monitor blood glucose levels to assess the possibility of tapering insulin. - Encourage continued lifestyle modifications, including swimming and dietary management.      Relevant Orders   Hemoglobin A1c   CBC with Differential/Platelet   Comprehensive metabolic panel with GFR   Hyperlipidemia associated with type 2 diabetes mellitus (HCC)   Managed with repatha. Excellent exercise management with improved stamina. Labs pending.       Relevant Orders   Hemoglobin A1c   CBC with Differential/Platelet   Comprehensive metabolic panel with GFR   Obesity, Class III, BMI 40-49.9 (morbid obesity) (HCC)   Working on diet and exercise with improved stamina with exercise. Continue regimen.       Relevant Orders   Hemoglobin A1c   CBC with Differential/Platelet   Comprehensive metabolic panel with GFR   Hypertension associated with type 2 diabetes mellitus (HCC)   Excellent blood pressure today with no concerns. Continue current treatment.       Relevant Orders   Hemoglobin A1c   CBC with Differential/Platelet   Comprehensive metabolic panel with GFR   Chronic systolic CHF (congestive heart failure), NYHA class 3 (HCC)   She is on medications including Repatha and Entresto for heart failure management. Reports feeling better and more active, with improved exercise tolerance and no recent falls. Her ejection fraction is reported to be at 60%, which is a positive indicator of heart function. Actively engaging in physical activities such as swimming and walking, which are beneficial for her cardiovascular health. - Continue current heart failure medications, including Repatha and Entresto. - Encourage continued physical activity, including swimming and walking      Relevant Orders   Hemoglobin A1c   CBC with Differential/Platelet   Comprehensive metabolic panel with GFR   Pancytopenia, acquired (HCC)   Suspect related to MGUS. WIll monitor       Relevant Orders   Hemoglobin A1c   CBC with Differential/Platelet   Comprehensive metabolic panel with GFR   MGUS (monoclonal gammopathy of unknown significance)   MGUS is being monitored with no indication of progression to a more serious condition at this time. She is aware of the condition and its implications. - Continue monitoring MGUS with regular follow-ups as needed.      Relevant Orders   Hemoglobin A1c   CBC with Differential/Platelet   Comprehensive metabolic panel with GFR   History of endometrial cancer   She had a hysterectomy and is concerned about the recurrence of gynecological issues. Advised to continue regular follow-ups with her gynecologist. Discussed the potential need for surveillance tests, such as CT scans, but no specific plan was made during this visit. Aware of the signs and symptoms to watch for concerning recurrence. The CA125 test was discussed but deemed unnecessary as there is no remaining endometrial tissue. - Continue regular follow-ups with the gynecologist as recommended. - Discuss with the gynecologist the need for any additional surveillance tests, such as CT scans.  Other Visit Diagnoses       Diarrhea, unspecified type         Mild intermittent asthma, unspecified whether complicated       Relevant Medications   albuterol (PROAIR HFA) 108 (90 Base) MCG/ACT inhaler       Return in about 4 months (around 04/04/2024) for Med Management 30.  [name], DNP, AGNP-c

## 2023-12-04 NOTE — Assessment & Plan Note (Signed)
 She is on medications including Repatha and Entresto for heart failure management. Reports feeling better and more active, with improved exercise tolerance and no recent falls. Her ejection fraction is reported to be at 60%, which is a positive indicator of heart function. Actively engaging in physical activities such as swimming and walking, which are beneficial for her cardiovascular health. - Continue current heart failure medications, including Repatha and Entresto. - Encourage continued physical activity, including swimming and walking

## 2023-12-05 ENCOUNTER — Encounter: Payer: Self-pay | Admitting: Nurse Practitioner

## 2023-12-05 LAB — HEMOGLOBIN A1C
Est. average glucose Bld gHb Est-mCnc: 180 mg/dL
Hgb A1c MFr Bld: 7.9 % — ABNORMAL HIGH (ref 4.8–5.6)

## 2023-12-05 LAB — CBC WITH DIFFERENTIAL/PLATELET
Basophils Absolute: 0.1 10*3/uL (ref 0.0–0.2)
Basos: 1 %
EOS (ABSOLUTE): 0.1 10*3/uL (ref 0.0–0.4)
Eos: 2 %
Hematocrit: 37 % (ref 34.0–46.6)
Hemoglobin: 12.2 g/dL (ref 11.1–15.9)
Immature Grans (Abs): 0 10*3/uL (ref 0.0–0.1)
Immature Granulocytes: 0 %
Lymphocytes Absolute: 1.6 10*3/uL (ref 0.7–3.1)
Lymphs: 23 %
MCH: 29.2 pg (ref 26.6–33.0)
MCHC: 33 g/dL (ref 31.5–35.7)
MCV: 89 fL (ref 79–97)
Monocytes Absolute: 0.4 10*3/uL (ref 0.1–0.9)
Monocytes: 6 %
Neutrophils Absolute: 4.6 10*3/uL (ref 1.4–7.0)
Neutrophils: 68 %
Platelets: 216 10*3/uL (ref 150–450)
RBC: 4.18 x10E6/uL (ref 3.77–5.28)
RDW: 13.6 % (ref 11.7–15.4)
WBC: 6.7 10*3/uL (ref 3.4–10.8)

## 2023-12-05 LAB — COMPREHENSIVE METABOLIC PANEL WITH GFR
ALT: 13 IU/L (ref 0–32)
AST: 15 IU/L (ref 0–40)
Albumin: 3.9 g/dL (ref 3.9–4.9)
Alkaline Phosphatase: 77 IU/L (ref 44–121)
BUN/Creatinine Ratio: 30 — ABNORMAL HIGH (ref 12–28)
BUN: 40 mg/dL — ABNORMAL HIGH (ref 8–27)
Bilirubin Total: 0.2 mg/dL (ref 0.0–1.2)
CO2: 19 mmol/L — ABNORMAL LOW (ref 20–29)
Calcium: 9.4 mg/dL (ref 8.7–10.3)
Chloride: 99 mmol/L (ref 96–106)
Creatinine, Ser: 1.35 mg/dL — ABNORMAL HIGH (ref 0.57–1.00)
Globulin, Total: 3.5 g/dL (ref 1.5–4.5)
Glucose: 204 mg/dL — ABNORMAL HIGH (ref 70–99)
Potassium: 5.4 mmol/L — ABNORMAL HIGH (ref 3.5–5.2)
Sodium: 135 mmol/L (ref 134–144)
Total Protein: 7.4 g/dL (ref 6.0–8.5)
eGFR: 43 mL/min/{1.73_m2} — ABNORMAL LOW (ref >59)

## 2023-12-05 NOTE — Telephone Encounter (Signed)
 Pt has been APPROVED for [employer] for 2025

## 2023-12-06 NOTE — Telephone Encounter (Signed)
 Gave pt a call following up on pt PAP [employer] ([employer]),received a letter from [facility] requesting for pt proof of income and primary address once received from pt to be fax to [phone],pt did not answer left a HIPAA VM.

## 2023-12-11 NOTE — Telephone Encounter (Signed)
 Following up with Terri Heath ([employer]) spoke with Terri today due to company requesting proof of income,Terri explain she does not want to continued with application that she will be purchasing from the pharmacy ,Terri is able to afford but if in the future she is in need of Terri's assistance she will call back.

## 2023-12-13 ENCOUNTER — Other Ambulatory Visit (HOSPITAL_COMMUNITY): Payer: Self-pay

## 2023-12-13 ENCOUNTER — Other Ambulatory Visit (HOSPITAL_BASED_OUTPATIENT_CLINIC_OR_DEPARTMENT_OTHER): Payer: Self-pay

## 2023-12-17 ENCOUNTER — Other Ambulatory Visit (HOSPITAL_BASED_OUTPATIENT_CLINIC_OR_DEPARTMENT_OTHER): Payer: Self-pay

## 2023-12-17 ENCOUNTER — Encounter: Payer: Self-pay | Admitting: Nurse Practitioner

## 2023-12-17 ENCOUNTER — Telehealth (INDEPENDENT_AMBULATORY_CARE_PROVIDER_SITE_OTHER): Admitting: Nurse Practitioner

## 2023-12-17 VITALS — BP 105/74 | HR 92 | Wt 273.0 lb

## 2023-12-17 DIAGNOSIS — M7541 Impingement syndrome of right shoulder: Secondary | ICD-10-CM | POA: Insufficient documentation

## 2023-12-17 DIAGNOSIS — M25511 Pain in right shoulder: Secondary | ICD-10-CM

## 2023-12-17 HISTORY — DX: Pain in right shoulder: M25.511

## 2023-12-17 MED ORDER — DICLOFENAC SODIUM 1 % EX GEL
2.0000 g | CUTANEOUS | 3 refills | Status: AC | PRN
  Filled 2023-12-17: qty 100, 30d supply, fill #0
  Filled 2023-12-27: qty 300, 38d supply, fill #0

## 2023-12-17 MED ORDER — LIDOCAINE 5 % EX PTCH
1.0000 | MEDICATED_PATCH | CUTANEOUS | 3 refills | Status: AC
  Filled 2023-12-17 – 2024-03-06 (×6): qty 30, 30d supply, fill #0
  Filled 2024-07-17: qty 30, 30d supply, fill #1
  Filled 2024-09-23: qty 30, 30d supply, fill #2

## 2023-12-17 NOTE — Patient Instructions (Addendum)
 I have sent in the referral for PT for the shoulder/upper back area and pain.  I have also sent in a prescription for the lidocaine  patches and the diclofenac  gel. They will let you know if these are less expensive over the counter.   I hope you feel better soon!!!

## 2023-12-17 NOTE — Assessment & Plan Note (Signed)
 Presents with shoulder blade pain radiating down the arm and tingling in the fingers, indicative of nerve impingement. Pain is unresponsive to acetaminophen  but somewhat alleviated by topical diclofenac  and heat. Symptoms exacerbate in the pool, suggesting possible spinal involvement. No arm strength loss reported. Topical diclofenac  is preferred over acetaminophen  due to its anti-inflammatory properties and minimal systemic absorption, reducing hepatic risk. Lidocaine  patches are considered for use during swimming to prevent cream wash-off. - Refer to physical therapy for evaluation and management of nerve impingement. - Continue using topical diclofenac  for pain relief. - Use acetaminophen  sparingly for pain management. - Prescribe lidocaine  patch for use as needed, especially when swimming. - Check insurance coverage for diclofenac  cream. - Send prescription for lidocaine  patch to [facility] pharmacy.

## 2023-12-17 NOTE — Progress Notes (Signed)
 Virtual Visit Encounter mychart visit.   I connected with  Terri Heath on 12/17/23 at 10:15 AM EDT by secure video and audio telemedicine application. I verified that I am speaking with the correct person using two identifiers.   I introduced myself as a Publishing rights manager with the practice. The limitations of evaluation and management by telemedicine discussed with the patient and the availability of in person appointments. The patient expressed verbal understanding and consent to proceed.  Participating parties in this visit include: Myself and patient  The patient is: Patient Location: Home I am: Provider Location: Office/Clinic Subjective:    CC and HPI: Terri Heath is a 68 y.o. year old female presenting for right shoulder paresthesia and pain.   History of Present Illness TKAI LARGE "Terri Heath" is a 68 year old female who presents with persistent scapular and right arm pain.  She experiences persistent pain in her shoulder blade that radiates down her arm, causing aching to the bone and tingling in her fingers. The pain is tender to the touch and does not improve with rest or massage. She uses Tylenol  and topical diclofenac  for relief, noting that the pain returns as soon as the medication wears off. The topical diclofenac  provides significant relief, especially when used at night with a heating pad.  The pain worsens when she is in the pool, describing a sensation of something 'floating, pinching the nerve.' Despite this, she continues to use the pool for exercise. The pain impacts her daily activities, making tasks like vacuuming and unloading the dishwasher difficult due to the discomfort.  No strength loss in the affected arm, but she notes difficulty with certain movements due to pain. She has a history of knee issues and has previously undergone physical therapy, which she found beneficial despite some setbacks.  She is currently using Tylenol  and topical diclofenac   for pain management. She also uses a heating pad at night for additional relief. She has not tried lidocaine  patches but is open to exploring this option for pain management.  Past medical history, Surgical history, Family history not pertinant except as noted below, Social history, Allergies, and medications have been entered into the medical record, reviewed, and corrections made.   Review of Systems:  All review of systems negative except what is listed in the HPI  Objective:    Alert and oriented x 4 Speaking in clear sentences with no shortness of breath. No distress.  Impression and Recommendations:    Problem List Items Addressed This Visit     Internal impingement of right shoulder - Primary   Presents with shoulder blade pain radiating down the arm and tingling in the fingers, indicative of nerve impingement. Pain is unresponsive to acetaminophen  but somewhat alleviated by topical diclofenac  and heat. Symptoms exacerbate in the pool, suggesting possible spinal involvement. No arm strength loss reported. Topical diclofenac  is preferred over acetaminophen  due to its anti-inflammatory properties and minimal systemic absorption, reducing hepatic risk. Lidocaine  patches are considered for use during swimming to prevent cream wash-off. - Refer to physical therapy for evaluation and management of nerve impingement. - Continue using topical diclofenac  for pain relief. - Use acetaminophen  sparingly for pain management. - Prescribe lidocaine  patch for use as needed, especially when swimming. - Check insurance coverage for diclofenac  cream. - Send prescription for lidocaine  patch to [facility] pharmacy.      Relevant Medications   diclofenac  Sodium (VOLTAREN ) 1 % GEL   lidocaine  (LIDODERM ) 5 %   Other  Relevant Orders   Ambulatory referral to Physical Therapy    orders and follow up as documented in EMR I discussed the assessment and treatment plan with the patient. The patient was  provided an opportunity to ask questions and all were answered. The patient agreed with the plan and demonstrated an understanding of the instructions.   The patient was advised to call back or seek an in-person evaluation if the symptoms worsen or if the condition fails to improve as anticipated.  Follow-Up: prn  I provided 16 minutes of non-face-to-face interaction with this non face-to-face encounter including intake, same-day documentation, and chart review.   Annella Kief, NP , DNP, AGNP-c [facility]

## 2023-12-17 NOTE — Assessment & Plan Note (Signed)
>>  ASSESSMENT AND PLAN FOR INTERNAL IMPINGEMENT OF RIGHT SHOULDER WRITTEN ON 12/17/2023 10:40 AM BY Lilton Pare E, NP  Presents with shoulder blade pain radiating down the arm and tingling in the fingers, indicative of nerve impingement. Pain is unresponsive to acetaminophen  but somewhat alleviated by topical diclofenac  and heat. Symptoms exacerbate in the pool, suggesting possible spinal involvement. No arm strength loss reported. Topical diclofenac  is preferred over acetaminophen  due to its anti-inflammatory properties and minimal systemic absorption, reducing hepatic risk. Lidocaine  patches are considered for use during swimming to prevent cream wash-off. - Refer to physical therapy for evaluation and management of nerve impingement. - Continue using topical diclofenac  for pain relief. - Use acetaminophen  sparingly for pain management. - Prescribe lidocaine  patch for use as needed, especially when swimming. - Check insurance coverage for diclofenac  cream. - Send prescription for lidocaine  patch to [facility] pharmacy.

## 2023-12-20 ENCOUNTER — Inpatient Hospital Stay: Payer: Medicare HMO | Attending: Gynecologic Oncology | Admitting: Gynecologic Oncology

## 2023-12-20 ENCOUNTER — Encounter: Payer: Self-pay | Admitting: Gynecologic Oncology

## 2023-12-20 VITALS — BP 106/63 | HR 92 | Temp 97.5°F | Resp 18 | Ht 67.0 in | Wt 277.8 lb

## 2023-12-20 DIAGNOSIS — Z90710 Acquired absence of both cervix and uterus: Secondary | ICD-10-CM | POA: Insufficient documentation

## 2023-12-20 DIAGNOSIS — Z8542 Personal history of malignant neoplasm of other parts of uterus: Secondary | ICD-10-CM

## 2023-12-20 DIAGNOSIS — Z923 Personal history of irradiation: Secondary | ICD-10-CM | POA: Insufficient documentation

## 2023-12-20 DIAGNOSIS — Z08 Encounter for follow-up examination after completed treatment for malignant neoplasm: Secondary | ICD-10-CM

## 2023-12-20 DIAGNOSIS — Z9221 Personal history of antineoplastic chemotherapy: Secondary | ICD-10-CM | POA: Diagnosis not present

## 2023-12-20 DIAGNOSIS — Z90722 Acquired absence of ovaries, bilateral: Secondary | ICD-10-CM | POA: Insufficient documentation

## 2023-12-20 DIAGNOSIS — Z9079 Acquired absence of other genital organ(s): Secondary | ICD-10-CM | POA: Insufficient documentation

## 2023-12-20 DIAGNOSIS — C541 Malignant neoplasm of endometrium: Secondary | ICD-10-CM

## 2023-12-20 NOTE — Progress Notes (Signed)
 Gynecologic Oncology Return Clinic Visit  12/20/23  Reason for Visit: surveillance in the setting of endometrial cancer   Treatment History: Oncology History Overview Note  Endometrioid adenocarcinoma   History of endometrial cancer  10/27/2021 Pathology Results   FINAL MICROSCOPIC DIAGNOSIS:   A. ENDOMETRIUM, BIOPSY:  - Endometrioid adenocarcinoma with focal secretory features.  - See comment.   COMMENT:  The carcinoma has endometrioid features with a few small foci with secretory features.  There are several foci of solid pattern and the findings are consistent with FIGO grade 1/2.  Immunohistochemistry shows diffuse strong positivity estrogen receptor and Napsin A is negative.  P53 shows wild-type pattern.    11/07/2021 Initial Diagnosis   Endometrial cancer (HCC)   11/14/2021 Imaging   MRI pelvis 1. Thickened (18 mm) endometrium, compatible with known endometrial malignancy. Evidence of full-thickness myometrial invasion by endometrial tumor throughout the left uterine body extending over 180 degrees of involvement. Probable early small focus of direct tumor invasion into the parametrial soft tissues in the anterior left uterine body as detailed. 2. Suspect a combination of direct endometrial tumor involvement of the upper endocervical canal and blood products within the endocervix. 3. No pelvic lymphadenopathy.  No adnexal masses.       11/24/2021 Imaging   CT abdomen and pelvis No evidence of abdominal metastatic disease or other acute findings.   Mild hepatic steatosis.   Colonic diverticulosis, without radiographic evidence of diverticulitis.   Aortic Atherosclerosis (ICD10-I70.0).     12/02/2021 Cancer Staging   Staging form: Corpus Uteri - Carcinoma and Carcinosarcoma, AJCC 8th Edition - Clinical stage from 12/02/2021: FIGO Stage IIIB (cT3b, cN0, cM0) - Signed by Almeda Jacobs, MD on 12/02/2021 Stage prefix: Initial diagnosis   12/07/2021 Procedure   Status post  placement of right IJ port catheter   12/13/2021 - 01/10/2022 Chemotherapy   Patient is on Treatment Plan : Uterine Cisplatin  days 1 and 29 + XRT      12/14/2021 - 02/01/2022 Radiation Therapy   Radiation Treatment Dates: 12/14/2021 through 02/01/2022 (IMRT : 12/14/21 through 01/17/22) (Brachytherapy - Tandem Ring : 02/01/22)  Site Technique Total Dose (Gy) Dose per Fx (Gy) Completed Fx Beam Energies  Uterus: Uterus IMRT 45/45 1.8 25/25 10X  Uterus: Uterus_Bst HDR-brachy 5.5/5.5 5.5 1/1 Ir-192        01/25/2022 Imaging   MRI pelvis Decreased endometrial thickness, consistent with interval response to therapy. Persistent deep myometrial invasion (> 50%) noted in the left lateral uterine corpus. No evidence of extra-uterine tumor extension.    No evidence of pelvic metastatic disease.   Sigmoid diverticulosis, without evidence of diverticulitis.   03/21/2022 Pathology Results   FINAL MICROSCOPIC DIAGNOSIS:   A. 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, FIGO 1  Focal residual atypical hyperplasia with squamous morular metaplasia  Tumor invades greater than 50% of the myometrium (9.75 mm of 16 mm)  (ypT1b)  Margins free  Extensive adenomyosis  Background inactive endometrium  Serosal endosalpingosis  Acute and chronic cervicitis with mucosal necrosis and numerous  nabothian cysts and tunnel clusters  Benign fallopian tubes and ovaries   ONCOLOGY TABLE:   UTERUS, CARCINOMA OR CARCINOSARCOMA: Resection   Procedure: Total hysterectomy and bilateral salpingo-oophorectomy  Histologic Type: Endometrioid adenocarcinoma  Histologic Grade: Well-differentiated, FIGO 1  Myometrial Invasion:       Depth of Myometrial Invasion (mm): 9.75 mm       Myometrial Thickness (mm):  16 mm       Percentage of Myometrial Invasion: 61%  Uterine Serosa Involvement: Not  identified  Cervical stromal Involvement: Not identified  Extent of involvement of other tissue/organs: Not identified  Peritoneal/Ascitic Fluid: Not available  Lymphovascular Invasion: Not identified  Regional Lymph Nodes: Not applicable (no lymph nodes submitted or found)   Distant Metastasis: Not applicable  Pathologic Stage Classification (pTNM, AJCC 8th Edition): ypT1b, pN n/a  Ancillary Studies: MMR / MSI has been ordered and will be reported in an  addendum  Representative Tumor Block: A7  Comment(s): The anterior endomyometrium shows extensive adenomyosis and scattered intramyometrial psammomatous calcifications.  Residual invasive tumor is identified within the posterior endomyometrium.  The otherwise benign endometrial and cervical epithelium shows apparent reactive/therapy associated atypia.  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 Obesity made retroperitoneal visualization limited and increased the complexity of the case and necessitated additional instrumentation for retraction. Obesity related complexity increased the duration of the procedure by 45 minutes.    Surgeon: Wiley Hanger MD    Operative Findings:  On EUA, 10 cm mobile uterus. On intra-abdominal entry, normal upper abdominal survey.  Omentum densely adherent to anterior abdominal wall along prior midline infraumbilical incision.  Normal-appearing small and large bowel.  Difficult intra-abdominal adiposity.  Cecum somewhat adherent to the right abdominal wall secondary to prior appendectomy.  Uterus approximately  8-10 cm and somewhat bulbous.  Normal-appearing bilateral adnexa.  No obvious adenopathy.  Some edema of the retroperitoneum.  Densely adherent bladder to the uterus and cervix from prior cesarean sections.  No gross evidence of disease. On cystoscopy, intact bladder dome. Good efflux from bilateral ureteral orifices.   04/05/2022 Procedure   Removal of implanted Port-A-Cath utilizing sharp and blunt dissection. The procedure was uncomplicated.   09/04/2022 Imaging   CT A/P: No acute abdominal or pelvic pathology. No evidence abdominal or pelvic metastatic disease.    Admission to the hospital for sepsis, labial abscess from 06/07/2023 through 06/14/2023. CT AP with contrast was performed during this time on 06/07/2023 and 06/12/2023   Interval History: Doing very well.  Denies any vaginal bleeding or discharge.  Swimming twice a week and trying to increase to 3 times a week.  Reports baseline bowel bladder function.    She has noticed some recent pain along her right shoulder blade with numbness sensation traveling down her right arm to her fingers.  Has an appointment to see a physical therapist soon.  Past Medical/Surgical History: Past Medical History:  Diagnosis Date   Abnormal vaginal bleeding in postmenopausal patient 10/11/2021   Acute systolic heart failure (HCC)    Allergy    Asthma    Bilateral shoulder pain    Borderline hypertension    Bronchitis    CAD (coronary artery disease)    Chronic systolic CHF (congestive heart failure) (HCC)    Chronic systolic congestive heart failure, NYHA class 3 (HCC) 07/24/2018   Closed fracture of distal fibula 07/17/2022   Dehydration 12/20/2021   Diabetes mellitus    Diagnosed in 2000, on insulin , Trulicity  and metformin , not checking cgs at home   Drug-induced hypotension 12/20/2021   Elevated serum creatinine 12/20/2021  Encounter to establish care 01/06/2021   Gangrenous appendicitis with perforation s/p lap appendectomy 10/04/2017  10/04/2017   GERD (gastroesophageal reflux disease)    History of MI (myocardial infarction)    History of radiation therapy    Uterus- 12/14/21-02/01/22- Dr. Retta Caster   Hyperlipidemia    Hypertension    Hypothyroidism    MGUS (monoclonal gammopathy of unknown significance)    Mitral regurgitation    Myocardial infarction (HCC)    Neuropathy    Obesity    Rash of back 01/06/2021   Sleep apnea    uses CPAP   Statin intolerance    Uterine cancer [facility])     Past Surgical History:  Procedure Laterality Date   BREATH TEK H PYLORI  09/18/2011   Procedure: BREATH TEK H PYLORI;  Surgeon: Thayne Fine, MD;  Location: [facility];  Service: General;  Laterality: N/A;   CESAREAN SECTION     x 3   IR IMAGING GUIDED PORT INSERTION  12/07/2021   IR REMOVAL TUN ACCESS W/ PORT W/O FL MOD SED  04/04/2022   LAPAROSCOPIC APPENDECTOMY N/A 10/03/2017   Procedure: APPENDECTOMY LAPAROSCOPIC;  Surgeon: Joyce Nixon, MD;  Location: [facility];  Service: General;  Laterality: N/A;   OPERATIVE ULTRASOUND N/A 02/01/2022   Procedure: OPERATIVE ULTRASOUND;  Surgeon: Retta Caster, MD;  Location: [facility];  Service: Urology;  Laterality: N/A;   RIGHT/LEFT HEART CATH AND CORONARY ANGIOGRAPHY N/A 02/12/2018   Procedure: RIGHT/LEFT HEART CATH AND CORONARY ANGIOGRAPHY;  Surgeon: Lucendia Rusk, MD;  Location: [facility]VASIVE CV LAB;  Service: Cardiovascular;  Laterality: N/A;   ROBOTIC ASSISTED TOTAL HYSTERECTOMY WITH BILATERAL SALPINGO OOPHERECTOMY Bilateral 03/21/2022   Procedure: XI ROBOTIC ASSISTED TOTAL HYSTERECTOMY WITH BILATERAL SALPINGO OOPHORECTOMY, LYSIS OF ADHESIONS, CYSTOSCOPY;  Surgeon: Suzi Essex, MD;  Location: [facility];  Service: Gynecology;  Laterality: Bilateral;   TANDEM RING INSERTION N/A 02/01/2022   Procedure: TANDEM RING INSERTION;  Surgeon: Retta Caster, MD;  Location: [facility];  Service: Urology;  Laterality: N/A;   TUBAL LIGATION      Family History  Problem Relation Age of Onset    Alcohol abuse Mother    Arthritis Mother    Diabetes Mother    Sleep apnea Mother    Anxiety disorder Mother    Eating disorder Mother    Obesity Mother    Cancer Father    Alcohol abuse Father    Heart disease Father    Hypertension Father    Stroke Father    Sleep apnea Sister    Breast cancer Neg Hx    Colon cancer Neg Hx    Ovarian cancer Neg Hx    Endometrial cancer Neg Hx    Pancreatic cancer Neg Hx    Prostate cancer Neg Hx     Social History   Socioeconomic History   Marital status: Married    Spouse name: Not on file   Number of children: Not on file   Years of education: Not on file   Highest education level: Not on file  Occupational History   Occupation: retired Sales promotion account executive, worked at [employer]  Tobacco Use   Smoking status: Former    Current packs/day: 0.00    Average packs/day: 1 pack/day for 15.0 years (15.0 ttl pk-yrs)    Types: Cigarettes    Start date: 01/29/1975    Quit date: 01/28/1990    Years since quitting: 33.9   Smokeless tobacco: Never  Vaping Use   Vaping  status: Never Used  Substance and Sexual Activity   Alcohol use: Not Currently   Drug use: No   Sexual activity: Not Currently    Birth control/protection: Post-menopausal, Abstinence  Other Topics Concern   Not on file  Social History Narrative   Lives in [place]   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   Social Drivers of Health   Financial Resource Strain: Low Risk  (05/03/2023)   Overall Financial Resource Strain (CARDIA)    Difficulty of Paying Living Expenses: Not very hard  Food Insecurity: No Food Insecurity (06/15/2023)   Hunger Vital Sign    Worried About Running Out of Food in the Last Year: Never true    Ran Out of Food in the Last Year: Never true  Transportation Needs: No Transportation Needs (06/15/2023)   PRAPARE - Administrator, Civil Service (Medical): No    Lack of Transportation (Non-Medical): No  Physical Activity: Insufficiently Active  (05/03/2023)   Exercise Vital Sign    Days of Exercise per Week: 1 day    Minutes of Exercise per Session: 30 min  Stress: No Stress Concern Present (05/03/2023)   [employer] of Occupational Health - Occupational Stress Questionnaire    Feeling of Stress : Only a little  Social Connections: Unknown (05/03/2023)   Social Connection and Isolation Panel [NHANES]    Frequency of Communication with Friends and Family: Three times a week    Frequency of Social Gatherings with Friends and Family: Once a week    Attends Religious Services: Patient unable to answer    Active Member of Clubs or Organizations: Yes    Attends Banker Meetings: Never    Marital Status: Married    Current Medications:  Current Outpatient Medications:    acetaminophen  (TYLENOL ) 500 MG tablet, Take 500-1,000 mg by mouth every 6 (six) hours as needed for mild pain or moderate pain., Disp: , Rfl:    albuterol  (PROAIR  HFA) 108 (90 Base) MCG/ACT inhaler, Inhale 2 puffs into the lungs every 6 (six) hours as needed for wheezing, Disp: 13.4 g, Rfl: 2   aspirin  81 MG chewable tablet, Chew 1 tablet (81 mg total) by mouth daily., Disp: 90 tablet, Rfl: 3   blood glucose meter kit and supplies, Use up to four times daily as directed. (FOR ICD-10 E10.9, E11.9)., Disp: 1 each, Rfl: 99   carvedilol  (COREG ) 6.25 MG tablet, Take 1 tablet (6.25 mg total) by mouth 2 (two) times daily., Disp: 180 tablet, Rfl: 1   diclofenac  Sodium (VOLTAREN ) 1 % GEL, Apply 2g to the affected area(s) topically as needed., Disp: 300 g, Rfl: 3   Evolocumab  (REPATHA  SURECLICK) 140 MG/ML SOAJ, Inject 140 mg into the skin every 14 (fourteen) days., Disp: 6 mL, Rfl: 3   furosemide  (LASIX ) 20 MG tablet, Take 1 tablet (20 mg total) by mouth daily., Disp: 90 tablet, Rfl: 1   glucose blood test strip, Use to check blood sugar up to 4 times daily, Disp: 100 each, Rfl: PRN   insulin  glargine (LANTUS  SOLOSTAR) 100 UNIT/ML Solostar Pen, Inject 25-40 Units  into the skin daily., Disp: 45 mL, Rfl: 3   Insulin  Pen Needle (BD PEN NEEDLE NANO 2ND GEN) 32G X 4 MM MISC, Use as directed daily., Disp: 100 each, Rfl: 3   levothyroxine  (SYNTHROID ) 50 MCG tablet, Take 1 tablet (50 mcg total) by mouth daily, before a meal., Disp: 90 tablet, Rfl: 1   lidocaine  (LIDODERM ) 5 %,  Place 1 patch onto the shoulder daily. Remove & Discard patch within 12 hours or as directed by your provider., Disp: 30 patch, Rfl: 3   MAGNESIUM  GLYCINATE PO, Take 1 tablet by mouth daily., Disp: , Rfl:    metFORMIN  (GLUCOPHAGE ) 1000 MG tablet, Take 1 tablet (1,000 mg total) by mouth 2 (two) times daily with a meal., Disp: 180 tablet, Rfl: 1   Multiple Vitamin (MULITIVITAMIN WITH MINERALS) TABS, Take 1 tablet by mouth daily with breakfast., Disp: , Rfl:    naproxen  sodium (ALEVE ) 220 MG tablet, Take 440 mg by mouth daily as needed (pain)., Disp: , Rfl:    OneTouch Delica Lancets 33G MISC, Use to check blood sugars 4 (four) times daily., Disp: 100 each, Rfl: 1   sacubitril -valsartan  (ENTRESTO ) 24-26 MG, Take 1 tablet by mouth 2 (two) times daily., Disp: 180 tablet, Rfl: 1   spironolactone  (ALDACTONE ) 25 MG tablet, Take 1 tablet (25 mg total) by mouth daily., Disp: 90 tablet, Rfl: 1   triamcinolone  ointment (KENALOG ) 0.1 %, Apply 1 application topically two times daily to affected area(s) as needed, sparing use to avoid whitening/thinning skin, Disp: 30 g, Rfl: 1  Review of Systems: + numbness Denies appetite changes, fevers, chills, fatigue, unexplained weight changes. Denies hearing loss, neck lumps or masses, mouth sores, ringing in ears or voice changes. Denies cough or wheezing.  Denies shortness of breath. Denies chest pain or palpitations. Denies leg swelling. Denies abdominal distention, pain, blood in stools, constipation, diarrhea, nausea, vomiting, or early satiety. Denies pain with intercourse, dysuria, frequency, hematuria or incontinence. Denies hot flashes, pelvic pain,  vaginal bleeding or vaginal discharge.   Denies joint pain, back pain or muscle pain/cramps. Denies itching, rash, or wounds. Denies dizziness, headaches or seizures. Denies swollen lymph nodes or glands, denies easy bruising or bleeding. Denies anxiety, depression, confusion, or decreased concentration.  Physical Exam: BP 106/63 (BP Location: Left Arm, Patient Position: Sitting)   Pulse 92   Temp (!) 97.5 F (36.4 C)   Resp 18   Ht 5\' 7"  (1.702 m)   Wt 277 lb 12.8 oz (126 kg)   SpO2 100%   BMI 43.51 kg/m  General: Alert, oriented, no acute distress. HEENT: Normocephalic, atraumatic, sclera anicteric. Chest: Clear to auscultation bilaterally.  No wheezes or rhonchi.  Keloid scar over her prior port site. Cardiovascular: Regular rate and rhythm, no murmurs. Abdomen: Obese, soft, nontender.  Normoactive bowel sounds.  No masses or hepatosplenomegaly appreciated.  Well-healed incisions. Extremities: Grossly normal range of motion.  Warm, well perfused.  Trace edema bilaterally. Lymphatics: No cervical, supraclavicular, or inguinal adenopathy. GU: Normal appearing external genitalia without erythema, excoriation, or lesions.  Speculum exam reveals moderately atrophic vaginal mucosa, vaginal apex with some radiation changes.  No masses or atypical vascularity.  Bimanual exam reveals cuff intact, no nodularity or firmness.  Rectovaginal exam confirms findings.  Laboratory & Radiologic Studies: None new  Assessment & Plan: Terri Heath is a 68 y.o. 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 Surgery 02/2022.  P53 wildtype. 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 MSI-H, MLH1 promoter hypermethylation present.    Patient is doing well and continues to be NED on exam today.   No evidence of labial infection/cellulitis on exam today.   Per NCCN surveillance recommendations, we will plan on surveillance  visits every 3 months for the first 2-3 years.  We discussed signs and symptoms that  would be concerning for cancer recurrence and she is advised to call if she develops any of these.  20 minutes of total time was spent for this patient encounter, including preparation, face-to-face counseling with the patient and coordination of care, and documentation of the encounter.  Wiley Hanger, MD  Division of Gynecologic Oncology  Department of Obstetrics and Gynecology  [facility] [place]  [facility]

## 2023-12-20 NOTE — Patient Instructions (Signed)
 It was good to see you today.  I do not see or feel any evidence of cancer recurrence on your exam.  I will see you for follow-up in 3 months.  As always, if you develop any new and concerning symptoms before your next visit, please call to see me sooner.

## 2023-12-24 ENCOUNTER — Telehealth: Payer: Self-pay | Admitting: Pharmacist

## 2023-12-24 ENCOUNTER — Other Ambulatory Visit (HOSPITAL_BASED_OUTPATIENT_CLINIC_OR_DEPARTMENT_OTHER): Payer: Self-pay

## 2023-12-24 DIAGNOSIS — E1169 Type 2 diabetes mellitus with other specified complication: Secondary | ICD-10-CM

## 2023-12-24 NOTE — Telephone Encounter (Signed)
 Restarted Repatha  from mid February,2025. Due for f/u lipid lab call patient to remind for the lab, N/A LVM

## 2023-12-27 ENCOUNTER — Other Ambulatory Visit (HOSPITAL_BASED_OUTPATIENT_CLINIC_OR_DEPARTMENT_OTHER): Payer: Self-pay

## 2023-12-27 ENCOUNTER — Encounter: Payer: Medicare HMO | Admitting: Nurse Practitioner

## 2024-01-11 NOTE — Telephone Encounter (Signed)
 Pt has enough Ozempic  for now.

## 2024-01-14 ENCOUNTER — Other Ambulatory Visit: Payer: Self-pay | Admitting: Nurse Practitioner

## 2024-01-14 ENCOUNTER — Ambulatory Visit: Payer: Self-pay

## 2024-01-14 ENCOUNTER — Other Ambulatory Visit (HOSPITAL_COMMUNITY): Payer: Self-pay

## 2024-01-14 DIAGNOSIS — E039 Hypothyroidism, unspecified: Secondary | ICD-10-CM

## 2024-01-14 DIAGNOSIS — E66813 Obesity, class 3: Secondary | ICD-10-CM

## 2024-01-14 DIAGNOSIS — E1159 Type 2 diabetes mellitus with other circulatory complications: Secondary | ICD-10-CM

## 2024-01-14 MED ORDER — LEVOTHYROXINE SODIUM 50 MCG PO TABS
50.0000 ug | ORAL_TABLET | Freq: Every day | ORAL | 1 refills | Status: DC
Start: 2024-01-14 — End: 2024-07-07
  Filled 2024-01-14: qty 90, 90d supply, fill #0
  Filled 2024-03-05 – 2024-04-11 (×4): qty 90, 90d supply, fill #1

## 2024-01-14 MED ORDER — METFORMIN HCL 1000 MG PO TABS
1000.0000 mg | ORAL_TABLET | Freq: Two times a day (BID) | ORAL | 1 refills | Status: DC
  Filled 2024-01-14: qty 180, 90d supply, fill #0
  Filled 2024-04-11: qty 180, 90d supply, fill #1

## 2024-01-14 NOTE — Telephone Encounter (Signed)
 This RN made first attempt to reach patient, LVM to return call to [phone]  Copied from CRM [id number]. Topic: Clinical - Red Word Triage >> Jan 14, 2024  8:47 AM Jenna Moan wrote: Red Word that prompted transfer to Nurse Triage: Has got bit by a tick and has gotten the tic out but the area has gotten red and hurts, its located on her upper thigh

## 2024-01-14 NOTE — Telephone Encounter (Signed)
 Chief Complaint: Tick bite Symptoms: Redness, swelling, itching at site upper inside left thigh Frequency: Onset on Saturday morning when tick was noticed and removed Pertinent Negatives: Patient denies other symptoms Disposition: [] ED /[] Urgent Care (no appt availability in office) / [x] Appointment(In office/virtual)/ []  [facility]/ [] Home Care/ [] Refused Recommended Disposition /[] [facility]/ []  Follow-up with PCP Additional Notes: Patient says she noticed the tick bite on Saturday morning and her daughter removed the entire tick using alcohol swab. She says she's been cleaning the area with alcohol and applying hydrocortisone  cream for the itching and now it's a black dot where the tick bit with redness around it. Advised no availability with PCP and earliest appointment with any provider is on Thursday 01/17/24. She says she will take that appointment, scheduled with Colvin Dec.    Reason for Disposition  Red ring or bull's-eye rash occurs at tick bite  Answer Assessment - Initial Assessment Questions 1. ATTACHED:  "Is the tick still on the skin?"  (e.g., yes, no, unsure)     No 2. ONSET - TICK NOT STILL ATTACHED: "If the tick has been removed, how long do you think the tick was attached before you removed it?" (e.g., 5 hours, 2 days). "When was this?"     Probably 2 hours on Saturday morning 4. LOCATION: "Where is the tick bite located?" (e.g., arm, leg)     Upper left thigh on the inside 5. TYPE of TICK: "Is it a wood tick or a deer tick?" (e.g., deer tick, wood tick; unsure)     Unsure 6. SIZE of TICK: "How big is the tick?" (e.g., size of poppy seed, apple seed, watermelon seed; unsure) Note: Deer ticks can be the size of a poppy seed (nymph) or an apple seed (adult).       The size of the end of a pencil 7. ENGORGED: "Did the tick look flat or engorged (full, swollen)?" (e.g., flat, engorged; unsure)     Engorged 8. OTHER SYMPTOMS: "Do you have any other  symptoms?" (e.g., fever, rash, redness at bite area, red ring around bite)     Redness around the black spot of the bite, raised, itching  Protocols used: Tick Bite-A-AH

## 2024-01-15 ENCOUNTER — Ambulatory Visit (INDEPENDENT_AMBULATORY_CARE_PROVIDER_SITE_OTHER): Payer: Medicare HMO

## 2024-01-15 DIAGNOSIS — Z0000 Encounter for general adult medical examination without abnormal findings: Secondary | ICD-10-CM

## 2024-01-15 NOTE — Patient Instructions (Addendum)
 Terri Heath , Thank you for taking time out of your busy schedule to complete your Annual Wellness Visit with me. I enjoyed our conversation and look forward to speaking with you again next year. I, as well as your care team,  appreciate your ongoing commitment to your health goals. Please review the following plan we discussed and let me know if I can assist you in the future. Your Game plan/ To Do List    Referrals: If you haven't heard from the office you've been referred to, please reach out to them at the phone provided.  N/a Follow up Visits: Next Medicare AWV with our clinical staff: 01/20/2025 at 2:10   Have you seen your provider in the last 6 months (3 months if uncontrolled diabetes)? Yes Next Office Visit with your provider: 01/17/2024 at 2:00  Clinician Recommendations:  Aim for 30 minutes of exercise or brisk walking, 6-8 glasses of water , and 5 servings of fruits and vegetables each day. Don't forget to make an appointment with your eye doctor for diabetic eye exam. You can get shingles vaccine at local pharmacy.      This is a list of the screening recommended for you and due dates:  Health Maintenance  Topic Date Due   Zoster (Shingles) Vaccine (1 of 2) Never done   COVID-19 Vaccine (3 - Pfizer risk series) 10/08/2019   Eye exam for diabetics  12/11/2023   Pneumonia Vaccine (2 of 2 - PCV) 05/02/2024*   Flu Shot  03/28/2024   Yearly kidney health urinalysis for diabetes  05/02/2024   Complete foot exam   05/02/2024   Hemoglobin A1C  06/04/2024   Yearly kidney function blood test for diabetes  12/03/2024   Medicare Annual Wellness Visit  01/14/2025   Mammogram  10/03/2025   Cologuard (Stool DNA test)  02/11/2026   DTaP/Tdap/Td vaccine (3 - Td or Tdap) 04/10/2029   DEXA scan (bone density measurement)  Completed   Hepatitis C Screening  Completed   HPV Vaccine  Aged Out   Meningitis B Vaccine  Aged Out  *Topic was postponed. The date shown is not the original due date.     Advanced directives: (ACP Link)Information on Advanced Care Planning can be found at Kenneth  Secretary of [facility] Advance Health Care Directives Advance Health Care Directives. [email or web address]  Advance Care Planning is important because it:  [x]  Makes sure you receive the medical care that is consistent with your values, goals, and preferences  [x]  It provides guidance to your family and loved ones and reduces their decisional burden about whether or not they are making the right decisions based on your wishes.  Follow the link provided in your after visit summary or read over the paperwork we have mailed to you to help you started getting your Advance Directives in place. If you need assistance in completing these, please reach out to us  so that we can help you!  See attachments for Preventive Care and Fall Prevention Tips.

## 2024-01-15 NOTE — Progress Notes (Signed)
 Subjective:   Terri Heath is a 68 y.o. who presents for a Medicare Wellness preventive visit.  As a reminder, Annual Wellness Visits don't include a physical exam, and some assessments may be limited, especially if this visit is performed virtually. We may recommend an in-person follow-up visit with your provider if needed.  Visit Complete: Virtual I connected with  Terri Heath on 01/15/24 by a audio enabled telemedicine application and verified that I am speaking with the correct person using two identifiers.  Patient Location: Home  Provider Location: Office/Clinic  I discussed the limitations of evaluation and management by telemedicine. The patient expressed understanding and agreed to proceed.  Vital Signs: Because this visit was a virtual/telehealth visit, some criteria may be missing or patient reported. Any vitals not documented were not able to be obtained and vitals that have been documented are patient reported.  VideoError- Librarian, academic were attempted between this provider and patient, however failed, due to patient having technical difficulties OR patient did not have access to video capability.  We continued and completed visit with audio only.   Persons Participating in Visit: Patient.  AWV Questionnaire: Yes: Patient Medicare AWV questionnaire was completed by the patient on 01/11/2024; I have confirmed that all information answered by patient is correct and no changes since this date.  Cardiac Risk Factors include: advanced age (>24men, >59 women);diabetes mellitus;dyslipidemia;hypertension     Objective:     Today's Vitals   There is no height or weight on file to calculate BMI.     01/15/2024    2:13 PM 06/07/2023    8:20 PM 06/07/2023    3:03 PM 02/06/2023    8:51 AM 12/20/2022    3:39 PM 07/17/2022   10:42 AM 03/08/2022    9:19 AM  Advanced Directives  Does Patient Have a Medical Advance Directive? No No No No  No No No  Would patient like information on creating a medical advance directive?  No - Patient declined   Yes (MAU/Ambulatory/Procedural Areas - Information given)  Yes (MAU/Ambulatory/Procedural Areas - Information given)    Current Medications (verified) Outpatient Encounter Medications as of 01/15/2024  Medication Sig   acetaminophen  (TYLENOL ) 500 MG tablet Take 500-1,000 mg by mouth every 6 (six) hours as needed for mild pain or moderate pain.   albuterol  (PROAIR  HFA) 108 (90 Base) MCG/ACT inhaler Inhale 2 puffs into the lungs every 6 (six) hours as needed for wheezing   aspirin  81 MG chewable tablet Chew 1 tablet (81 mg total) by mouth daily.   blood glucose meter kit and supplies Use up to four times daily as directed. (FOR ICD-10 E10.9, E11.9).   carvedilol  (COREG ) 6.25 MG tablet Take 1 tablet (6.25 mg total) by mouth 2 (two) times daily.   diclofenac  Sodium (VOLTAREN ) 1 % GEL Apply 2g to the affected area(s) topically as needed.   Evolocumab  (REPATHA  SURECLICK) 140 MG/ML SOAJ Inject 140 mg into the skin every 14 (fourteen) days.   furosemide  (LASIX ) 20 MG tablet Take 1 tablet (20 mg total) by mouth daily.   glucose blood test strip Use to check blood sugar up to 4 times daily   insulin  glargine (LANTUS  SOLOSTAR) 100 UNIT/ML Solostar Pen Inject 25-40 Units into the skin daily.   Insulin  Pen Needle (BD PEN NEEDLE NANO 2ND GEN) 32G X 4 MM MISC Use as directed daily.   levothyroxine  (SYNTHROID ) 50 MCG tablet Take 1 tablet (50 mcg total) by mouth daily, before  a meal.   lidocaine  (LIDODERM ) 5 % Place 1 patch onto the shoulder daily. Remove & Discard patch within 12 hours or as directed by your provider.   MAGNESIUM  GLYCINATE PO Take 1 tablet by mouth daily.   metFORMIN  (GLUCOPHAGE ) 1000 MG tablet Take 1 tablet (1,000 mg total) by mouth 2 (two) times daily with a meal.   Multiple Vitamin (MULITIVITAMIN WITH MINERALS) TABS Take 1 tablet by mouth daily with breakfast.   naproxen  sodium (ALEVE )  220 MG tablet Take 440 mg by mouth daily as needed (pain).   OneTouch Delica Lancets 33G MISC Use to check blood sugars 4 (four) times daily.   sacubitril -valsartan  (ENTRESTO ) 24-26 MG Take 1 tablet by mouth 2 (two) times daily.   Semaglutide ,0.25 or 0.5MG /DOS, (OZEMPIC , 0.25 OR 0.5 MG/DOSE,) 2 MG/3ML SOPN Inject into the skin.   spironolactone  (ALDACTONE ) 25 MG tablet Take 1 tablet (25 mg total) by mouth daily.   triamcinolone  ointment (KENALOG ) 0.1 % Apply 1 application topically two times daily to affected area(s) as needed, sparing use to avoid whitening/thinning skin   [DISCONTINUED] insulin  detemir (LEVEMIR ) 100 UNIT/ML FlexPen Inject 10 Units into the skin daily.   No facility-administered encounter medications on file as of 01/15/2024.    Allergies (verified) Oxycodone  hcl, Amoxicillin , Adhesive [tape], Exenatide, Invokana [canagliflozin], Jardiance  [empagliflozin ], Lisinopril , Other, and Statins   History: Past Medical History:  Diagnosis Date   Abnormal vaginal bleeding in postmenopausal patient 10/11/2021   Acute systolic heart failure (HCC)    Allergy    Asthma    Bilateral shoulder pain    Borderline hypertension    Bronchitis    CAD (coronary artery disease)    Chronic systolic CHF (congestive heart failure) (HCC)    Chronic systolic congestive heart failure, NYHA class 3 (HCC) 07/24/2018   Closed fracture of distal fibula 07/17/2022   Dehydration 12/20/2021   Diabetes mellitus    Diagnosed in 2000, on insulin , Trulicity  and metformin , not checking cgs at home   Drug-induced hypotension 12/20/2021   Elevated serum creatinine 12/20/2021   Encounter to establish care 01/06/2021   Gangrenous appendicitis with perforation s/p lap appendectomy 10/04/2017 10/04/2017   GERD (gastroesophageal reflux disease)    History of MI (myocardial infarction)    History of radiation therapy    Uterus- 12/14/21-02/01/22- Dr. Retta Caster   Hyperlipidemia    Hypertension     Hypothyroidism    MGUS (monoclonal gammopathy of unknown significance)    Mitral regurgitation    Myocardial infarction (HCC)    Neuropathy    Obesity    Rash of back 01/06/2021   Sleep apnea    uses CPAP   Statin intolerance    Uterine cancer [facility])    Past Surgical History:  Procedure Laterality Date   BREATH TEK H PYLORI  09/18/2011   Procedure: BREATH TEK H PYLORI;  Surgeon: Thayne Fine, MD;  Location: [facility];  Service: General;  Laterality: N/A;   CESAREAN SECTION     x 3   IR IMAGING GUIDED PORT INSERTION  12/07/2021   IR REMOVAL TUN ACCESS W/ PORT W/O FL MOD SED  04/04/2022   LAPAROSCOPIC APPENDECTOMY N/A 10/03/2017   Procedure: APPENDECTOMY LAPAROSCOPIC;  Surgeon: Joyce Nixon, MD;  Location: [facility];  Service: General;  Laterality: N/A;   OPERATIVE ULTRASOUND N/A 02/01/2022   Procedure: OPERATIVE ULTRASOUND;  Surgeon: Retta Caster, MD;  Location: [facility];  Service: Urology;  Laterality: N/A;   RIGHT/LEFT HEART CATH AND CORONARY ANGIOGRAPHY N/A 02/12/2018  Procedure: RIGHT/LEFT HEART CATH AND CORONARY ANGIOGRAPHY;  Surgeon: Lucendia Rusk, MD;  Location: [facility] INVASIVE CV LAB;  Service: Cardiovascular;  Laterality: N/A;   ROBOTIC ASSISTED TOTAL HYSTERECTOMY WITH BILATERAL SALPINGO OOPHERECTOMY Bilateral 03/21/2022   Procedure: XI ROBOTIC ASSISTED TOTAL HYSTERECTOMY WITH BILATERAL SALPINGO OOPHORECTOMY, LYSIS OF ADHESIONS, CYSTOSCOPY;  Surgeon: Suzi Essex, MD;  Location: [facility];  Service: Gynecology;  Laterality: Bilateral;   TANDEM RING INSERTION N/A 02/01/2022   Procedure: TANDEM RING INSERTION;  Surgeon: Retta Caster, MD;  Location: [facility];  Service: Urology;  Laterality: N/A;   TUBAL LIGATION     Family History  Problem Relation Age of Onset   Alcohol abuse Mother    Arthritis Mother    Diabetes Mother    Sleep apnea Mother    Anxiety disorder Mother    Eating disorder Mother    Obesity Mother    Cancer Father    Alcohol abuse Father    Heart disease  Father    Hypertension Father    Stroke Father    Sleep apnea Sister    Breast cancer Neg Hx    Colon cancer Neg Hx    Ovarian cancer Neg Hx    Endometrial cancer Neg Hx    Pancreatic cancer Neg Hx    Prostate cancer Neg Hx    Social History   Socioeconomic History   Marital status: Married    Spouse name: Not on file   Number of children: Not on file   Years of education: Not on file   Highest education level: Some college, no degree  Occupational History   Occupation: retired Sales promotion account executive, worked at [employer]  Tobacco Use   Smoking status: Former    Current packs/day: 0.00    Average packs/day: 1 pack/day for 15.0 years (15.0 ttl pk-yrs)    Types: Cigarettes    Start date: 01/29/1975    Quit date: 01/28/1990    Years since quitting: 33.9   Smokeless tobacco: Never  Vaping Use   Vaping status: Never Used  Substance and Sexual Activity   Alcohol use: Not Currently   Drug use: No   Sexual activity: Not Currently    Birth control/protection: Post-menopausal, Abstinence  Other Topics Concern   Not on file  Social History Narrative   Lives in [place]   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   Social Drivers of Health   Financial Resource Strain: Medium Risk (01/11/2024)   Overall Financial Resource Strain (CARDIA)    Difficulty of Paying Living Expenses: Somewhat hard  Food Insecurity: No Food Insecurity (01/11/2024)   Hunger Vital Sign    Worried About Running Out of Food in the Last Year: Never true    Ran Out of Food in the Last Year: Never true  Transportation Needs: No Transportation Needs (01/11/2024)   PRAPARE - Administrator, Civil Service (Medical): No    Lack of Transportation (Non-Medical): No  Physical Activity: Insufficiently Active (01/11/2024)   Exercise Vital Sign    Days of Exercise per Week: 2 days    Minutes of Exercise per Session: 60 min  Stress: No Stress Concern Present (01/11/2024)   [employer] of Occupational Health -  Occupational Stress Questionnaire    Feeling of Stress : Only a little  Social Connections: Socially Integrated (01/11/2024)   Social Connection and Isolation Panel [NHANES]    Frequency of Communication with Friends and Family: Three times a week  Frequency of Social Gatherings with Friends and Family: Once a week    Attends Religious Services: More than 4 times per year    Active Member of [employer] or Organizations: Yes    Attends Engineer, structural: More than 4 times per year    Marital Status: Married    Tobacco Counseling Counseling given: Not Answered    Clinical Intake:  Pre-visit preparation completed: Yes  Pain : No/denies pain     Nutritional Risks: None Diabetes: Yes CBG done?: No Did pt. bring in CBG monitor from home?: No  Lab Results  Component Value Date   HGBA1C 7.9 (H) 12/04/2023   HGBA1C 7.5 (H) 08/27/2023   HGBA1C 9.3 (H) 05/03/2023     How often do you need to have someone help you when you read instructions, pamphlets, or other written materials from your doctor or pharmacy?: 1 - Never  Interpreter Needed?: No  Information entered by :: NAllen LPN   Activities of Daily Living     01/11/2024   12:13 PM 06/07/2023    8:20 PM  In your present state of health, do you have any difficulty performing the following activities:  Hearing? 0 0  Vision? 0 0  Difficulty concentrating or making decisions? 0 0  Walking or climbing stairs? 1   Dressing or bathing? 0   Doing errands, shopping? 1 1  Comment usually has someone Facilities manager and eating ? N   Using the Toilet? N   In the past six months, have you accidently leaked urine? Y   Do you have problems with loss of bowel control? N   Managing your Medications? N   Managing your Finances? N   Housekeeping or managing your Housekeeping? N     Patient Care Team: Early, Adriane Albe, NP as PCP - General (Nurse Practitioner) Lucendia Rusk, MD as PCP - Cardiology  (Cardiology) Lind Repine, MD as Consulting Physician (Pulmonary Disease) Juanita Norlander, MD (General Surgery) Almeda Jacobs, MD as Consulting Physician (Hematology and Oncology) Carnell Christian, [facility] (Pharmacist)  Indicate any recent Medical Services you may have received from other than [facility] providers in the past year (date may be approximate).     Assessment:    This is a routine wellness examination for [place].  Hearing/Vision screen Hearing Screening - Comments:: Denies hearing issues Vision Screening - Comments:: Regular eye exams, [facility] Op[facility]   Goals Addressed             This Visit's Progress    Patient Stated       01/15/2024, start physical therapy       Depression Screen     01/15/2024    2:14 PM 12/04/2023    2:17 PM 07/18/2023    3:12 PM 06/26/2023    2:37 PM 06/18/2023   10:53 AM 02/06/2023    8:52 AM 12/26/2022    2:07 PM  PHQ 2/9 Scores  PHQ - 2 Score 1 0 0 0 1 0 0  PHQ- 9 Score 5     0     Fall Risk     01/11/2024   12:13 PM 12/04/2023    2:16 PM 05/03/2023    8:34 PM 02/02/2023    2:15 PM 12/26/2022    2:06 PM  Fall Risk   Falls in the past year? 0 0 1 1 1   Comment    tripped over ottoman, weakness   Number falls in past yr: 0 0  0 1 1  Injury with Fall? 0 0 1 1 0  Comment    broke leg   Risk for fall due to : Medication side effect No Fall Risks Impaired balance/gait Impaired mobility;Impaired balance/gait;Medication side effect Impaired mobility  Follow up Falls prevention discussed;Falls evaluation completed Falls evaluation completed Falls evaluation completed Falls prevention discussed;Education provided;Falls evaluation completed Falls evaluation completed    MEDICARE RISK AT HOME:  Medicare Risk at Home Any stairs in or around the home?: (Patient-Rptd) Yes If so, are there any without handrails?: (Patient-Rptd) No Home free of loose throw rugs in walkways, pet beds, electrical cords, etc?: (Patient-Rptd) Yes Adequate lighting in your home  to reduce risk of falls?: (Patient-Rptd) Yes Life alert?: (Patient-Rptd) No Use of a cane, walker or w/c?: (Patient-Rptd) Yes Grab bars in the bathroom?: (Patient-Rptd) Yes Shower chair or bench in shower?: (Patient-Rptd) Yes Elevated toilet seat or a handicapped toilet?: (Patient-Rptd) Yes  TIMED UP AND GO:  Was the test performed?  No  Cognitive Function: 6CIT completed        01/15/2024    2:16 PM 02/06/2023    8:54 AM  6CIT Screen  What Year? 0 points 0 points  What month? 0 points 0 points  What time? 0 points 0 points  Count back from 20 0 points 4 points  Months in reverse 0 points 0 points  Repeat phrase 0 points 0 points  Total Score 0 points 4 points    Immunizations Immunization History  Administered Date(s) Administered   Fluad Quad(high Dose 65+) 05/29/2022   Fluad Trivalent(High Dose 65+) 05/03/2023   Influenza Split 05/23/2011, 05/09/2012   Influenza,inj,Quad PF,6+ Mos 05/07/2017, 06/09/2021   Influenza-Unspecified 06/23/2020   MMR 11/29/2000   PFIZER(Purple Top)SARS-COV-2 Vaccination 08/23/2019, 09/10/2019   Pneumococcal Polysaccharide-23 08/25/2005   Td 08/25/2005   Tdap 04/11/2019    Screening Tests Health Maintenance  Topic Date Due   Zoster Vaccines- Shingrix (1 of 2) Never done   COVID-19 Vaccine (3 - Pfizer risk series) 10/08/2019   OPHTHALMOLOGY EXAM  12/11/2023   Pneumonia Vaccine 92+ Years old (2 of 2 - PCV) 05/02/2024 (Originally 08/25/2006)   INFLUENZA VACCINE  03/28/2024   Diabetic kidney evaluation - Urine ACR  05/02/2024   FOOT EXAM  05/02/2024   HEMOGLOBIN A1C  06/04/2024   Diabetic kidney evaluation - eGFR measurement  12/03/2024   Medicare Annual Wellness (AWV)  01/14/2025   MAMMOGRAM  10/03/2025   Fecal DNA (Cologuard)  02/11/2026   DTaP/Tdap/Td (3 - Td or Tdap) 04/10/2029   DEXA SCAN  Completed   Hepatitis C Screening  Completed   HPV VACCINES  Aged Out   Meningococcal B Vaccine  Aged Out    Health Maintenance  Health  Maintenance Due  Topic Date Due   Zoster Vaccines- Shingrix (1 of 2) Never done   COVID-19 Vaccine (3 - Pfizer risk series) 10/08/2019   OPHTHALMOLOGY EXAM  12/11/2023   Health Maintenance Items Addressed: Due for Shingrix and covid vaccine. Patient to call for eye exams  Additional Screening:  Vision Screening: Recommended annual ophthalmology exams for early detection of glaucoma and other disorders of the eye.  Dental Screening: Recommended annual dental exams for proper oral hygiene  Community Resource Referral / Chronic Care Management: CRR required this visit?  No   CCM required this visit?  No   Plan:    I have personally reviewed and noted the following in the patient's chart:   Medical and social history Use  of alcohol, tobacco or illicit drugs  Current medications and supplements including opioid prescriptions. Patient is not currently taking opioid prescriptions. Functional ability and status Nutritional status Physical activity Advanced directives List of other physicians Hospitalizations, surgeries, and ER visits in previous 12 months Vitals Screenings to include cognitive, depression, and falls Referrals and appointments  In addition, I have reviewed and discussed with patient certain preventive protocols, quality metrics, and best practice recommendations. A written personalized care plan for preventive services as well as general preventive health recommendations were provided to patient.   Areatha Beecham, LPN   [id number]   After Visit Summary: (MyChart) Due to this being a telephonic visit, the after visit summary with patients personalized plan was offered to patient via MyChart   Notes: Nothing significant to report at this time.

## 2024-01-16 ENCOUNTER — Encounter (HOSPITAL_BASED_OUTPATIENT_CLINIC_OR_DEPARTMENT_OTHER): Payer: Self-pay

## 2024-01-16 ENCOUNTER — Ambulatory Visit (HOSPITAL_BASED_OUTPATIENT_CLINIC_OR_DEPARTMENT_OTHER): Admitting: Physical Therapy

## 2024-01-16 NOTE — Therapy (Incomplete)
 OUTPATIENT PHYSICAL THERAPY SHOULDER EVALUATION   Patient Name: Terri Heath MRN: [id number] DOB:Sep 30, 1955, 68 y.o., female Today's Date: 01/16/2024  END OF SESSION:   Past Medical History:  Diagnosis Date   Abnormal vaginal bleeding in postmenopausal patient 10/11/2021   Acute systolic heart failure (HCC)    Allergy    Asthma    Bilateral shoulder pain    Borderline hypertension    Bronchitis    CAD (coronary artery disease)    Chronic systolic CHF (congestive heart failure) (HCC)    Chronic systolic congestive heart failure, NYHA class 3 (HCC) 07/24/2018   Closed fracture of distal fibula 07/17/2022   Dehydration 12/20/2021   Diabetes mellitus    Diagnosed in 2000, on insulin , Trulicity  and metformin , not checking cgs at home   Drug-induced hypotension 12/20/2021   Elevated serum creatinine 12/20/2021   Encounter to establish care 01/06/2021   Gangrenous appendicitis with perforation s/p lap appendectomy 10/04/2017 10/04/2017   GERD (gastroesophageal reflux disease)    History of MI (myocardial infarction)    History of radiation therapy    Uterus- 12/14/21-02/01/22- Dr. Retta Caster   Hyperlipidemia    Hypertension    Hypothyroidism    MGUS (monoclonal gammopathy of unknown significance)    Mitral regurgitation    Myocardial infarction (HCC)    Neuropathy    Obesity    Rash of back 01/06/2021   Sleep apnea    uses CPAP   Statin intolerance    Uterine cancer [facility])    Past Surgical History:  Procedure Laterality Date   BREATH TEK H PYLORI  09/18/2011   Procedure: BREATH TEK H PYLORI;  Surgeon: Thayne Fine, MD;  Location: [facility];  Service: General;  Laterality: N/A;   CESAREAN SECTION     x 3   IR IMAGING GUIDED PORT INSERTION  12/07/2021   IR REMOVAL TUN ACCESS W/ PORT W/O FL MOD SED  04/04/2022   LAPAROSCOPIC APPENDECTOMY N/A 10/03/2017   Procedure: APPENDECTOMY LAPAROSCOPIC;  Surgeon: Joyce Nixon, MD;  Location: [facility];  Service: General;   Laterality: N/A;   OPERATIVE ULTRASOUND N/A 02/01/2022   Procedure: OPERATIVE ULTRASOUND;  Surgeon: Retta Caster, MD;  Location: [facility];  Service: Urology;  Laterality: N/A;   RIGHT/LEFT HEART CATH AND CORONARY ANGIOGRAPHY N/A 02/12/2018   Procedure: RIGHT/LEFT HEART CATH AND CORONARY ANGIOGRAPHY;  Surgeon: Lucendia Rusk, MD;  Location: [facility] INVASIVE CV LAB;  Service: Cardiovascular;  Laterality: N/A;   ROBOTIC ASSISTED TOTAL HYSTERECTOMY WITH BILATERAL SALPINGO OOPHERECTOMY Bilateral 03/21/2022   Procedure: XI ROBOTIC ASSISTED TOTAL HYSTERECTOMY WITH BILATERAL SALPINGO OOPHORECTOMY, LYSIS OF ADHESIONS, CYSTOSCOPY;  Surgeon: Suzi Essex, MD;  Location: [facility];  Service: Gynecology;  Laterality: Bilateral;   TANDEM RING INSERTION N/A 02/01/2022   Procedure: TANDEM RING INSERTION;  Surgeon: Retta Caster, MD;  Location: [facility];  Service: Urology;  Laterality: N/A;   TUBAL LIGATION     Patient Active Problem List   Diagnosis Date Noted   Internal impingement of right shoulder 12/17/2023   Sciatic nerve pain 08/28/2023   Cellulitis of perineum 06/11/2023   Hyperglycemia due to diabetes mellitus (HCC) 06/11/2023   Labial abscess 06/07/2023   Statin myopathy 05/03/2023   Hypomagnesemia 01/17/2022   Pancytopenia, acquired (HCC) 01/03/2022   Diabetic peripheral neuropathy associated with type 2 diabetes mellitus (HCC) 12/02/2021   History of endometrial cancer 11/07/2021   Vitamin D deficiency 01/06/2021   Claudication in peripheral vascular disease (HCC) 09/16/2020   Chronic systolic CHF (congestive  heart failure), NYHA class 3 (HCC) 07/24/2018   Acquired hypothyroidism 07/24/2018   Cardiomyopathy, ischemic 07/24/2018   MGUS (monoclonal gammopathy of unknown significance) 03/06/2018   Dyspnea 01/14/2018   Allergic rhinitis 06/11/2014   Asthma due to seasonal allergies 10/24/2012   OSA (obstructive sleep apnea) 05/23/2011   Poorly controlled type 2 diabetes mellitus with circulatory  disorder (HCC) 03/26/2007   Hyperlipidemia associated with type 2 diabetes mellitus (HCC) 03/26/2007   Obesity, Class III, BMI 40-49.9 (morbid obesity) 03/26/2007   Hypertension associated with type 2 diabetes mellitus (HCC) 03/26/2007    PCP: Annella Kief, NP  REFERRING PROVIDER: Annella Kief, NP  REFERRING DIAG: M75.41 (ICD-10-CM) - Internal impingement of right shoulder  THERAPY DIAG:  No diagnosis found.  Rationale for Evaluation and Treatment: Rehabilitation  ONSET DATE: ***  SUBJECTIVE:                                                                                                                                                                                      SUBJECTIVE STATEMENT: *** Hand dominance: {MISC; OT HAND DOMINANCE:[phone]}  PERTINENT HISTORY: CHF, DM neuropathy (sock distribution), CAD, previous uterine cancer  PAIN:  Are you having pain? {OPRCPAIN:27236}  PRECAUTIONS: Other: Adhesive Allergy*  RED FLAGS: None   WEIGHT BEARING RESTRICTIONS: No  FALLS:  Has patient fallen in last 6 months? {fallsyesno:27318}  OCCUPATION: previously [employer], [employer]   PLOF: {PLOF:24004}  PATIENT GOALS:***   OBJECTIVE: (objective measures from initial evaluation unless otherwise dated)  PATIENT SURVEYS: UEFS  Extreme difficulty/unable (0), Quite a bit of difficulty (1), Moderate difficulty (2), Little difficulty (3), No difficulty (4) Survey date:  01/16/24  Any of your usual work, household or school activities   2. Your usual hobbies, recreational/sport activities    3. Lifting a bag of groceries to waist level    4. Lifting a bag of groceries above your head   5. Grooming your hair   6. Pushing up on your hands (I.e. from bathtub or chair)   7. Preparing food (I.e. peeling/cutting)   8. Driving    9. Vacuuming, sweeping, or raking   10. Dressing    11. Doing up buttons   12. Using tools/appliances   13. Opening doors   14. Cleaning    15.  Tying or lacing shoes   16. Sleeping    17. Laundering clothes (I.e. washing, ironing, folding)   18. Opening a jar   19. Throwing a ball   20. Carrying a small suitcase with your affected limb.    Score total:  ***     COGNITION: Overall cognitive status: Within functional limits for tasks  assessed     SENSATION: {sensation:27233}  POSTURE: rounded shoulders, forward head, increased thoracic kyphosis, anterior pelvic tilt, and flexed trunk    UPPER EXTREMITY ROM:   Active ROM Right eval Left eval  Shoulder flexion    Shoulder extension    Shoulder abduction    Shoulder adduction    Shoulder internal rotation    Shoulder external rotation    Elbow flexion    Elbow extension    Wrist flexion    Wrist extension    Wrist ulnar deviation    Wrist radial deviation    Wrist pronation    Wrist supination    (Blank rows = not tested) *=pain/symptoms  UPPER EXTREMITY MMT:  MMT Right eval Left eval  Shoulder flexion    Shoulder extension    Shoulder abduction    Shoulder adduction    Shoulder internal rotation    Shoulder external rotation    Middle trapezius    Lower trapezius    Elbow flexion    Elbow extension    Wrist flexion    Wrist extension    Wrist ulnar deviation    Wrist radial deviation    Wrist pronation    Wrist supination    Grip strength (lbs)    (Blank rows = not tested) *=pain/symptoms  SHOULDER SPECIAL TESTS: Impingement tests: {shoulder impingement test:25231:a} SLAP lesions: {SLAP lesions:25232} Instability tests: {shoulder instability test:25233} Rotator cuff assessment: {rotator cuff assessment:25234} Biceps assessment: {biceps assessment:25235}  JOINT MOBILITY TESTING:  ***  PALPATION:  ***   TODAY'S TREATMENT:                                                                                                                                         DATE:  01/16/24 ***  PATIENT EDUCATION:  Education details: Patient educated on  exam findings, POC, scope of PT, HEP, relevant anatomy and biomechanics, and ***. Person educated: Patient Education method: Explanation, Demonstration, and Handouts Education comprehension: verbalized understanding, returned demonstration, verbal cues required, and tactile cues required  HOME EXERCISE PROGRAM: ***  ASSESSMENT:  CLINICAL IMPRESSION: Patient a 68 y.o. y.o. female who was seen today for physical therapy evaluation and treatment for R shoulder pain. Patient presents with pain limited deficits in R shoulder strength, ROM, endurance, activity tolerance, and functional mobility with ADL. Patient is having to modify and restrict ADL as indicated by outcome measure score as well as subjective information and objective measures which is affecting overall participation. Patient will benefit from skilled physical therapy in order to improve function and reduce impairment.  OBJECTIVE IMPAIRMENTS: decreased activity tolerance, decreased endurance, decreased mobility, decreased ROM, decreased strength, increased muscle spasms, impaired flexibility, improper body mechanics, postural dysfunction, and pain.   ACTIVITY LIMITATIONS: carrying, lifting, bending, stairs, transfers, bathing, reach over head, hygiene/grooming, and caring for others  PARTICIPATION LIMITATIONS: meal prep, cleaning, laundry, shopping, community activity, and yard work  PERSONAL FACTORS: Fitness, Time since onset of injury/illness/exacerbation, and 3+ comorbidities: CHF, DM neuropathy (sock distribution), CAD, previous uterine cancer are also affecting patient's functional outcome.   REHAB POTENTIAL: {rehabpotential:25112}  CLINICAL DECISION MAKING: {clinical decision making:25114}  EVALUATION COMPLEXITY: {Evaluation complexity:25115}   GOALS: Goals reviewed with patient? Yes  SHORT TERM GOALS: Target date: ***  Patient will be independent with HEP in order to improve functional outcomes. Baseline: Goal  status: INITIAL  2.  Patient will report at least 25% improvement in symptoms for improved quality of life. Baseline:  Goal status: INITIAL  3.  *** Baseline: *** Goal status: {GOALSTATUS:25110}  4.  *** Baseline: *** Goal status: {GOALSTATUS:25110}  5.  *** Baseline: *** Goal status: {GOALSTATUS:25110}  6.  *** Baseline: *** Goal status: {GOALSTATUS:25110}  LONG TERM GOALS: Target date: ***  Patient will report at least 75% improvement in symptoms for improved quality of life. Baseline:  Goal status: INITIAL  2.  Patient will improve UEFS score by at least *** points in order to indicate improved tolerance to activity. Baseline: *** Goal status: INITIAL  3.  Patient will demonstrate at least *** in shoulder AROM in flexion for improved ability lift overhead. Baseline:  Goal status: INITIAL  4.  Patient will be able to return to all activities unrestricted for improved ability to perform work functions and participate with family.  Baseline:  Goal status: INITIAL  5.  Patient will demonstrate grade of 5/5 MMT grade in all tested musculature as evidence of improved strength to assist with lifting at home. Baseline:  Goal status: INITIAL  6.  *** Baseline: *** Goal status: {GOALSTATUS:25110}  PLAN:  PT FREQUENCY: {rehab frequency:25116}  PT DURATION: {rehab duration:25117}  PLANNED INTERVENTIONS: 97164- PT Re-evaluation, 97110-Therapeutic exercises, 97530- Therapeutic activity, 97112- Neuromuscular re-education, 97535- Self Care, 16109- Manual therapy, [phone]- Gait training, [phone]- Orthotic Fit/training, [phone]- Canalith repositioning, [id number]- Aquatic Therapy, [phone]- Splinting, Patient/Family education, Balance training, Stair training, Taping, Dry Needling, Joint mobilization, Joint manipulation, Spinal manipulation, Spinal mobilization, Scar mobilization, and DME instructions.   PLAN FOR NEXT SESSION: ***   Beather Liming, PT, DPT 01/16/2024, 12:31 PM    Date of referral: 12/17/23 Referring provider: Annella Kief, NP Referring diagnosis? M75.41 (ICD-10-CM) - Internal impingement of right shoulder Treatment diagnosis? (if different than referring diagnosis) M25.511 Pain in R shoulder  What was this (referring dx) caused by? Ongoing Issue  Nature of Condition: {Nature of Condition:30889}   Laterality: Rt  Current Functional Measure Score: Other UEFS:     Objective measurements identify impairments when they are compared to normal values, the uninvolved extremity, and prior level of function.  [x]  Yes  []  No  Objective assessment of functional ability: {assessment of functional ability:30892}   Briefly describe symptoms: ***  How did symptoms start: ***  Average pain intensity:  Last 24 hours: ***  Past week: ***  How often does the pt experience symptoms? {Frequency of symptoms:30893}  How much have the symptoms interfered with usual daily activities? {Interfere with ZHYQ:65784}  How has condition changed since care began at this facility? {Condition Changed:30895}  In general, how is the patients overall health? {Overall Health:30896}   BACK PAIN (STarT Back Screening Tool) No

## 2024-01-17 ENCOUNTER — Ambulatory Visit (INDEPENDENT_AMBULATORY_CARE_PROVIDER_SITE_OTHER): Admitting: Medical

## 2024-01-17 ENCOUNTER — Encounter: Payer: Self-pay | Admitting: Medical

## 2024-01-17 VITALS — BP 100/60 | HR 90 | Temp 97.5°F | Ht 67.0 in | Wt 281.6 lb

## 2024-01-17 DIAGNOSIS — E785 Hyperlipidemia, unspecified: Secondary | ICD-10-CM | POA: Diagnosis not present

## 2024-01-17 DIAGNOSIS — E1169 Type 2 diabetes mellitus with other specified complication: Secondary | ICD-10-CM | POA: Diagnosis not present

## 2024-01-17 DIAGNOSIS — W57XXXA Bitten or stung by nonvenomous insect and other nonvenomous arthropods, initial encounter: Secondary | ICD-10-CM

## 2024-01-17 DIAGNOSIS — S70362A Insect bite (nonvenomous), left thigh, initial encounter: Secondary | ICD-10-CM | POA: Diagnosis not present

## 2024-01-17 NOTE — Progress Notes (Signed)
 Subjective:  Terri Heath is a 68 y.o. female who presents for Chief Complaint  Patient presents with   Insect Bite    Tick bite Saturday inner upper thigh/groin area. Patient is also fasting and would like to know if she can have lipid panel done for Dr. Mitzie Anda for her Repatha .      Here for c/o tick bite.   Found tick on her let inner thigh 6 days ago.  Thinks it got on her the night before from her husband coming in with possible tick on his clothing being out in the yard.  She noticed it on her thigh and her daughter came over to help with the help.  She tried to get it off initially but could not but by the time her daughter cleaned the area and pulled it off it was barely hanging on.  She has a reddish-purple area she wants looked at.  She has no fever.  No new fatigue or no new joint pains.  She has joint pains, but no new or worse joint pain since the tick bite.  Her husband wanted her to come in just in case  Wants to avoid antibiotics as she was on a bunch of antibiotics in October 2024 for cellulitis  She also is due for fasting lipids.  She started Repatha  few months ago and is due for fasting lipids.  She is fasting today.  No other aggravating or relieving factors.    No other c/o.  Past Medical History:  Diagnosis Date   Abnormal vaginal bleeding in postmenopausal patient 10/11/2021   Acute systolic heart failure (HCC)    Allergy    Asthma    Bilateral shoulder pain    Borderline hypertension    Bronchitis    CAD (coronary artery disease)    Chronic systolic CHF (congestive heart failure) (HCC)    Chronic systolic congestive heart failure, NYHA class 3 (HCC) 07/24/2018   Closed fracture of distal fibula 07/17/2022   Dehydration 12/20/2021   Diabetes mellitus    Diagnosed in 2000, on insulin , Trulicity  and metformin , not checking cgs at home   Drug-induced hypotension 12/20/2021   Elevated serum creatinine 12/20/2021   Encounter to establish care 01/06/2021    Gangrenous appendicitis with perforation s/p lap appendectomy 10/04/2017 10/04/2017   GERD (gastroesophageal reflux disease)    History of MI (myocardial infarction)    History of radiation therapy    Uterus- 12/14/21-02/01/22- Dr. Retta Caster   Hyperlipidemia    Hypertension    Hypothyroidism    MGUS (monoclonal gammopathy of unknown significance)    Mitral regurgitation    Myocardial infarction (HCC)    Neuropathy    Obesity    Rash of back 01/06/2021   Sleep apnea    uses CPAP   Statin intolerance    Uterine cancer (HCC)    Current Outpatient Medications on File Prior to Visit  Medication Sig Dispense Refill   acetaminophen  (TYLENOL ) 500 MG tablet Take 500-1,000 mg by mouth every 6 (six) hours as needed for mild pain or moderate pain.     aspirin  81 MG chewable tablet Chew 1 tablet (81 mg total) by mouth daily. 90 tablet 3   blood glucose meter kit and supplies Use up to four times daily as directed. (FOR ICD-10 E10.9, E11.9). 1 each 99   carvedilol  (COREG ) 6.25 MG tablet Take 1 tablet (6.25 mg total) by mouth 2 (two) times daily. 180 tablet 1   diclofenac  Sodium (VOLTAREN ) 1 %  GEL Apply 2g to the affected area(s) topically as needed. 300 g 3   Evolocumab  (REPATHA  SURECLICK) 140 MG/ML SOAJ Inject 140 mg into the skin every 14 (fourteen) days. 6 mL 3   furosemide  (LASIX ) 20 MG tablet Take 1 tablet (20 mg total) by mouth daily. 90 tablet 1   glucose blood test strip Use to check blood sugar up to 4 times daily 100 each PRN   insulin  glargine (LANTUS  SOLOSTAR) 100 UNIT/ML Solostar Pen Inject 25-40 Units into the skin daily. 45 mL 3   Insulin  Pen Needle (BD PEN NEEDLE NANO 2ND GEN) 32G X 4 MM MISC Use as directed daily. 100 each 3   levothyroxine  (SYNTHROID ) 50 MCG tablet Take 1 tablet (50 mcg total) by mouth daily, before a meal. 90 tablet 1   MAGNESIUM  GLYCINATE PO Take 1 tablet by mouth daily.     metFORMIN  (GLUCOPHAGE ) 1000 MG tablet Take 1 tablet (1,000 mg total) by mouth 2 (two)  times daily with a meal. 180 tablet 1   Multiple Vitamin (MULITIVITAMIN WITH MINERALS) TABS Take 1 tablet by mouth daily with breakfast.     OneTouch Delica Lancets 33G MISC Use to check blood sugars 4 (four) times daily. 100 each 1   sacubitril -valsartan  (ENTRESTO ) 24-26 MG Take 1 tablet by mouth 2 (two) times daily. 180 tablet 1   Semaglutide ,0.25 or 0.5MG /DOS, (OZEMPIC , 0.25 OR 0.5 MG/DOSE,) 2 MG/3ML SOPN Inject into the skin.     spironolactone  (ALDACTONE ) 25 MG tablet Take 1 tablet (25 mg total) by mouth daily. 90 tablet 1   albuterol  (PROAIR  HFA) 108 (90 Base) MCG/ACT inhaler Inhale 2 puffs into the lungs every 6 (six) hours as needed for wheezing (Patient not taking: Reported on 01/17/2024) 13.4 g 2   lidocaine  (LIDODERM ) 5 % Place 1 patch onto the shoulder daily. Remove & Discard patch within 12 hours or as directed by your provider. (Patient not taking: Reported on 01/17/2024) 30 patch 3   naproxen  sodium (ALEVE ) 220 MG tablet Take 440 mg by mouth daily as needed (pain). (Patient not taking: Reported on 01/17/2024)     triamcinolone  ointment (KENALOG ) 0.1 % Apply 1 application topically two times daily to affected area(s) as needed, sparing use to avoid whitening/thinning skin (Patient not taking: Reported on 01/17/2024) 30 g 1   [DISCONTINUED] insulin  detemir (LEVEMIR ) 100 UNIT/ML FlexPen Inject 10 Units into the skin daily. 15 mL 11   No current facility-administered medications on file prior to visit.     The following portions of the patient's history were reviewed and updated as appropriate: allergies, current medications, past family history, past medical history, past social history, past surgical history and problem list.  ROS Otherwise as in subjective above     Objective: BP 100/60   Pulse 90   Temp (!) 97.5 F (36.4 C) (Tympanic)   Ht 5\' 7"  (1.702 m)   Wt 281 lb 9.6 oz (127.7 kg)   SpO2 97%   BMI 44.10 kg/m   General appearance: alert, no distress, well developed,  well nourished Left inner upper thigh with approximately 23 cm diameter area of erythema/purplish coloration.  No induration.  No warmth.  No bull's-eye rash.  Leg otherwise nontender.  Exam chaperoned by nurse.    CBC 12/04/23 normal    Assessment: Encounter Diagnoses  Name Primary?   Tick bite of left thigh, initial encounter Yes   Hyperlipidemia associated with type 2 diabetes mellitus (HCC)      Plan: Tick bite  We discussed the findings.  The tick was apparently on their very short period of time.  There is no erythema migrans and resting urinary symptoms.  The area has a small reddish purplish rash with no tick parts present.  No induration or fluctuance.  We discussed doxycycline  prophylactic dose.  She wants to hold off on antibiotics at this time.  Advise she use steroid cream such as Cortaid over-the-counter daily for the next 1 to 2 weeks then after that use a daily moisturizing lotion to the area.  Keep the area clean with soap and water .  We discussed if any fever, worse rash, new body aches or chills or other new symptoms to call back and get reevaluated  Hyperlipidemia-continue Repatha  injection.  Labs today.  Sible "[name]" was seen today for insect bite.  Diagnoses and all orders for this visit:  Tick bite of left thigh, initial encounter  Hyperlipidemia associated with type 2 diabetes mellitus (HCC) -     NMR, lipoprofile -     Apolipoprotein B    Follow up: pending labs

## 2024-01-18 LAB — NMR, LIPOPROFILE
Cholesterol, Total: 217 mg/dL — ABNORMAL HIGH (ref 100–199)
HDL Particle Number: 31 umol/L (ref >=30.5)
HDL-C: 48 mg/dL (ref >39)
LDL Particle Number: 1210 nmol/L — ABNORMAL HIGH (ref <1000)
LDL Size: 21.4 nm (ref >20.5)
LDL-C (NIH Calc): 135 mg/dL — ABNORMAL HIGH (ref 0–99)
LP-IR Score: 59 — ABNORMAL HIGH (ref <=45)
Small LDL Particle Number: 299 nmol/L (ref <=527)
Triglycerides: 188 mg/dL — ABNORMAL HIGH (ref 0–149)

## 2024-01-18 LAB — APOLIPOPROTEIN B: Apolipoprotein B: 110 mg/dL — ABNORMAL HIGH (ref <90)

## 2024-01-21 ENCOUNTER — Ambulatory Visit: Payer: Self-pay | Admitting: Medical

## 2024-01-21 NOTE — Progress Notes (Signed)
 Here are the lipid labs you requested.  She was in fasting last week and wanted labs forwarded to you.

## 2024-01-22 NOTE — Telephone Encounter (Signed)
 Apt scheduled to for lipid clinic on Thursday 01/24/2024 at 2:30

## 2024-01-23 ENCOUNTER — Other Ambulatory Visit: Payer: Self-pay | Admitting: Cardiology

## 2024-01-23 ENCOUNTER — Other Ambulatory Visit: Payer: Self-pay

## 2024-01-23 ENCOUNTER — Ambulatory Visit (HOSPITAL_BASED_OUTPATIENT_CLINIC_OR_DEPARTMENT_OTHER): Attending: Nurse Practitioner | Admitting: Physical Therapy

## 2024-01-23 ENCOUNTER — Other Ambulatory Visit: Payer: Self-pay | Admitting: Nurse Practitioner

## 2024-01-23 ENCOUNTER — Encounter (HOSPITAL_BASED_OUTPATIENT_CLINIC_OR_DEPARTMENT_OTHER): Payer: Self-pay | Admitting: Physical Therapy

## 2024-01-23 ENCOUNTER — Other Ambulatory Visit (HOSPITAL_BASED_OUTPATIENT_CLINIC_OR_DEPARTMENT_OTHER): Payer: Self-pay

## 2024-01-23 DIAGNOSIS — R29898 Other symptoms and signs involving the musculoskeletal system: Secondary | ICD-10-CM

## 2024-01-23 DIAGNOSIS — E1159 Type 2 diabetes mellitus with other circulatory complications: Secondary | ICD-10-CM

## 2024-01-23 DIAGNOSIS — R2689 Other abnormalities of gait and mobility: Secondary | ICD-10-CM

## 2024-01-23 DIAGNOSIS — M7541 Impingement syndrome of right shoulder: Secondary | ICD-10-CM | POA: Insufficient documentation

## 2024-01-23 DIAGNOSIS — M6281 Muscle weakness (generalized): Secondary | ICD-10-CM | POA: Diagnosis not present

## 2024-01-23 DIAGNOSIS — E782 Mixed hyperlipidemia: Secondary | ICD-10-CM

## 2024-01-23 DIAGNOSIS — M25511 Pain in right shoulder: Secondary | ICD-10-CM | POA: Diagnosis not present

## 2024-01-23 DIAGNOSIS — I5022 Chronic systolic (congestive) heart failure: Secondary | ICD-10-CM

## 2024-01-23 MED ORDER — SPIRONOLACTONE 25 MG PO TABS
25.0000 mg | ORAL_TABLET | Freq: Every day | ORAL | 1 refills | Status: DC
  Filled 2024-01-23: qty 90, 90d supply, fill #0
  Filled 2024-04-24: qty 90, 90d supply, fill #1

## 2024-01-23 MED ORDER — ENTRESTO 24-26 MG PO TABS
1.0000 | ORAL_TABLET | Freq: Two times a day (BID) | ORAL | 1 refills | Status: DC
  Filled 2024-01-23: qty 180, 90d supply, fill #0
  Filled 2024-04-24: qty 180, 90d supply, fill #1

## 2024-01-23 MED ORDER — FUROSEMIDE 20 MG PO TABS
20.0000 mg | ORAL_TABLET | Freq: Every day | ORAL | 1 refills | Status: DC
  Filled 2024-01-23: qty 90, 90d supply, fill #0
  Filled 2024-04-24: qty 90, 90d supply, fill #1

## 2024-01-23 MED ORDER — CARVEDILOL 6.25 MG PO TABS
6.2500 mg | ORAL_TABLET | Freq: Two times a day (BID) | ORAL | 1 refills | Status: DC
  Filled 2024-01-23: qty 180, 90d supply, fill #0
  Filled 2024-04-17: qty 180, 90d supply, fill #1

## 2024-01-23 NOTE — Therapy (Signed)
 OUTPATIENT PHYSICAL THERAPY SHOULDER EVALUATION   Patient Name: Terri Heath MRN: [id number] DOB:1955/10/19, 68 y.o., female Today's Date: 01/23/2024  END OF SESSION:  PT End of Session - 01/23/24 1500     Visit Number 1    Number of Visits 16    Date for PT Re-Evaluation 03/19/24    Authorization Type [employer]    Progress Note Due on Visit 10    PT Start Time 1500    PT Stop Time 1542    PT Time Calculation (min) 42 min    Activity Tolerance Patient tolerated treatment well    Behavior During Therapy WFL for tasks assessed/performed             Past Medical History:  Diagnosis Date   Abnormal vaginal bleeding in postmenopausal patient 10/11/2021   Acute systolic heart failure (HCC)    Allergy    Asthma    Bilateral shoulder pain    Borderline hypertension    Bronchitis    CAD (coronary artery disease)    Chronic systolic CHF (congestive heart failure) (HCC)    Chronic systolic congestive heart failure, NYHA class 3 (HCC) 07/24/2018   Closed fracture of distal fibula 07/17/2022   Dehydration 12/20/2021   Diabetes mellitus    Diagnosed in 2000, on insulin , Trulicity  and metformin , not checking cgs at home   Drug-induced hypotension 12/20/2021   Elevated serum creatinine 12/20/2021   Encounter to establish care 01/06/2021   Gangrenous appendicitis with perforation s/p lap appendectomy 10/04/2017 10/04/2017   GERD (gastroesophageal reflux disease)    History of MI (myocardial infarction)    History of radiation therapy    Uterus- 12/14/21-02/01/22- Dr. Retta Caster   Hyperlipidemia    Hypertension    Hypothyroidism    MGUS (monoclonal gammopathy of unknown significance)    Mitral regurgitation    Myocardial infarction (HCC)    Neuropathy    Obesity    Rash of back 01/06/2021   Sleep apnea    uses CPAP   Statin intolerance    Uterine cancer [facility])    Past Surgical History:  Procedure Laterality Date   BREATH TEK H PYLORI  09/18/2011    Procedure: BREATH TEK H PYLORI;  Surgeon: Thayne Fine, MD;  Location: [facility];  Service: General;  Laterality: N/A;   CESAREAN SECTION     x 3   IR IMAGING GUIDED PORT INSERTION  12/07/2021   IR REMOVAL TUN ACCESS W/ PORT W/O FL MOD SED  04/04/2022   LAPAROSCOPIC APPENDECTOMY N/A 10/03/2017   Procedure: APPENDECTOMY LAPAROSCOPIC;  Surgeon: Joyce Nixon, MD;  Location: [facility];  Service: General;  Laterality: N/A;   OPERATIVE ULTRASOUND N/A 02/01/2022   Procedure: OPERATIVE ULTRASOUND;  Surgeon: Retta Caster, MD;  Location: [facility];  Service: Urology;  Laterality: N/A;   RIGHT/LEFT HEART CATH AND CORONARY ANGIOGRAPHY N/A 02/12/2018   Procedure: RIGHT/LEFT HEART CATH AND CORONARY ANGIOGRAPHY;  Surgeon: Lucendia Rusk, MD;  Location: [facility] INVASIVE CV LAB;  Service: Cardiovascular;  Laterality: N/A;   ROBOTIC ASSISTED TOTAL HYSTERECTOMY WITH BILATERAL SALPINGO OOPHERECTOMY Bilateral 03/21/2022   Procedure: XI ROBOTIC ASSISTED TOTAL HYSTERECTOMY WITH BILATERAL SALPINGO OOPHORECTOMY, LYSIS OF ADHESIONS, CYSTOSCOPY;  Surgeon: Suzi Essex, MD;  Location: [facility];  Service: Gynecology;  Laterality: Bilateral;   TANDEM RING INSERTION N/A 02/01/2022   Procedure: TANDEM RING INSERTION;  Surgeon: Retta Caster, MD;  Location: [facility];  Service: Urology;  Laterality: N/A;   TUBAL LIGATION  Patient Active Problem List   Diagnosis Date Noted   Internal impingement of right shoulder 12/17/2023   Sciatic nerve pain 08/28/2023   Cellulitis of perineum 06/11/2023   Hyperglycemia due to diabetes mellitus (HCC) 06/11/2023   Labial abscess 06/07/2023   Statin myopathy 05/03/2023   Hypomagnesemia 01/17/2022   Pancytopenia, acquired (HCC) 01/03/2022   Diabetic peripheral neuropathy associated with type 2 diabetes mellitus (HCC) 12/02/2021   History of endometrial cancer 11/07/2021   Vitamin D deficiency 01/06/2021   Claudication in peripheral vascular disease (HCC) 09/16/2020   Chronic systolic CHF  (congestive heart failure), NYHA class 3 (HCC) 07/24/2018   Acquired hypothyroidism 07/24/2018   Cardiomyopathy, ischemic 07/24/2018   MGUS (monoclonal gammopathy of unknown significance) 03/06/2018   Dyspnea 01/14/2018   Allergic rhinitis 06/11/2014   Asthma due to seasonal allergies 10/24/2012   OSA (obstructive sleep apnea) 05/23/2011   Poorly controlled type 2 diabetes mellitus with circulatory disorder (HCC) 03/26/2007   Hyperlipidemia associated with type 2 diabetes mellitus (HCC) 03/26/2007   Obesity, Class III, BMI 40-49.9 (morbid obesity) 03/26/2007   Hypertension associated with type 2 diabetes mellitus (HCC) 03/26/2007    PCP: Annella Kief, NP  REFERRING PROVIDER: Annella Kief, NP  REFERRING DIAG: M75.41 (ICD-10-CM) - Internal impingement of right shoulder  THERAPY DIAG:  Right shoulder pain, unspecified chronicity  Muscle weakness (generalized)  Other abnormalities of gait and mobility  Other symptoms and signs involving the musculoskeletal system  Rationale for Evaluation and Treatment: Rehabilitation  ONSET DATE: 2-3 months  SUBJECTIVE:                                                                                                                                                                                      SUBJECTIVE STATEMENT: Did PT a year ago for her knee and did great. Had a few months where she couldn't do the pool. Spend some time with health issues. Back in pool but some upper back symptoms. RUE tingling/numbness. Wants to build upper body strength. Symptoms began about 2-3 months ago with walking in pool.  Hand dominance: Right  PERTINENT HISTORY: CHF, DM neuropathy (sock distribution), CAD, previous uterine cancer  PAIN:  Are you having pain? Yes: NPRS scale: 3-4/10 Pain location: R periscap Pain description: ache with RUE numb/tingling Aggravating factors: aquatics Relieving factors: meds, heat  PRECAUTIONS: Other: Adhesive  Allergy*  RED FLAGS: None   WEIGHT BEARING RESTRICTIONS: No  FALLS:  Has patient fallen in last 6 months? No  OCCUPATION: previously [employer], EKG   PLOF: Independent  PATIENT GOALS:pain management/overcome pain   OBJECTIVE: (objective measures from initial evaluation unless otherwise dated)  PATIENT SURVEYS:  UEFS  Extreme difficulty/unable (0), Quite a bit of difficulty (1), Moderate difficulty (2), Little difficulty (3), No difficulty (4) Survey date:  01/23/24  Any of your usual work, household or school activities 2  2. Your usual hobbies, recreational/sport activities 1   3. Lifting a bag of groceries to waist level 3   4. Lifting a bag of groceries above your head 1  5. Grooming your hair 3  6. Pushing up on your hands (I.e. from bathtub or chair) 2  7. Preparing food (I.e. peeling/cutting) 1  8. Driving  3  9. Vacuuming, sweeping, or raking 1  10. Dressing  3  11. Doing up buttons 3  12. Using tools/appliances 2  13. Opening doors 3  14. Cleaning  3  15. Tying or lacing shoes 2  16. Sleeping  1  17. Laundering clothes (I.e. washing, ironing, folding) 2  18. Opening a jar 2  19. Throwing a ball 2  20. Carrying a small suitcase with your affected limb.  2  Score total:  42/80     COGNITION: Overall cognitive status: Within functional limits for tasks assessed     SENSATION: Impaired RUE in wrist extensor region and lateral 3 fingers posteriorly   POSTURE: rounded shoulders, forward head, increased thoracic kyphosis, anterior pelvic tilt, and flexed trunk    UPPER EXTREMITY ROM: WFL bilateral UE  Cervical AROM:    01/23/2024      Flexion  0% limited * (worst)     Extension  25% limited *     R ROT 0% limited     L ROT  0% limited     R SB   50% limited*    L SB 25% limited     * symptoms (RUE radicular symptoms) (Blank rows = not tested)    UPPER EXTREMITY MMT:  MMT Right eval Left eval  Shoulder flexion 4+ 4+  Shoulder extension     Shoulder abduction 4+ 4+  Shoulder adduction    Shoulder internal rotation 5 5  Shoulder external rotation 4+ 4+  Middle trapezius    Lower trapezius    Elbow flexion 4+ 5  Elbow extension 5 5  Wrist flexion 5 5  Wrist extension 5 5  Wrist ulnar deviation    Wrist radial deviation    Wrist pronation    Wrist supination    Grip strength (lbs) 42# 22#  (Blank rows = not tested) *=pain/symptoms     PALPATION:  TTP bilateral periscap musculature with R radicular symptoms with palpation of R levator scap, rhomboids, middle trap, UT   TODAY'S TREATMENT:                                                                                                                                         DATE:  01/23/24 Eval, education, HEP  PATIENT EDUCATION:  Education details: Patient educated on  exam findings, POC, scope of PT, HEP, relevant anatomy and biomechanics, and self STM with tennis ball. Person educated: Patient Education method: Explanation, Demonstration, and Handouts Education comprehension: verbalized understanding, returned demonstration, verbal cues required, and tactile cues required  HOME EXERCISE PROGRAM: Access Code: [email or web address] URL: [email or web address] Date: 01/23/2024 Prepared by: Debria Fang Denai Caba  Exercises - Seated Scapular Retraction  - 2 x daily - 7 x weekly - 2 sets - 10 reps  ASSESSMENT:  CLINICAL IMPRESSION: Patient a 68 y.o. y.o. female who was seen today for physical therapy evaluation and treatment for R shoulder pain. Patient presents with pain limited deficits in R shoulder strength, ROM, endurance, activity tolerance, and functional mobility with ADL. Patient is having to modify and restrict ADL as indicated by outcome measure score as well as subjective information and objective measures which is affecting overall participation. Symptoms likely caused by posture and UE positioning with activity/aquatics. Patient will benefit from skilled  physical therapy in order to improve function and reduce impairment.  OBJECTIVE IMPAIRMENTS: decreased activity tolerance, decreased endurance, decreased mobility, decreased ROM, decreased strength, increased muscle spasms, impaired flexibility, improper body mechanics, postural dysfunction, and pain.   ACTIVITY LIMITATIONS: carrying, lifting, bending, stairs, transfers, bathing, reach over head, hygiene/grooming, and caring for others  PARTICIPATION LIMITATIONS: meal prep, cleaning, laundry, shopping, community activity, and yard work  PERSONAL FACTORS: Fitness, Time since onset of injury/illness/exacerbation, and 3+ comorbidities: CHF, DM neuropathy (sock distribution), CAD, previous uterine cancer are also affecting patient's functional outcome.   REHAB POTENTIAL: Good  CLINICAL DECISION MAKING: Evolving/moderate complexity  EVALUATION COMPLEXITY: Moderate   GOALS: Goals reviewed with patient? Yes  SHORT TERM GOALS: Target date: 02/20/2024    Patient will be independent with HEP in order to improve functional outcomes. Baseline: Goal status: INITIAL  2.  Patient will report at least 25% improvement in symptoms for improved quality of life. Baseline:  Goal status: INITIAL    LONG TERM GOALS: Target date: 03/19/2024    Patient will report at least 75% improvement in symptoms for improved quality of life. Baseline:  Goal status: INITIAL  2.  Patient will improve UEFS score by at least 9 points in order to indicate improved tolerance to activity. Baseline:  Goal status: INITIAL  3.  Patient will have reduction in tissue tension in R periscap region for reduction in symptoms. Baseline:  Goal status: INITIAL  4.  Patient will be able to return to all activities unrestricted for improved ability to exercise. Baseline:  Goal status: INITIAL  5.  Patient will demonstrate grade of 5/5 MMT grade in all tested musculature as evidence of improved strength to assist with  lifting at home. Baseline:  Goal status: INITIAL   PLAN:  PT FREQUENCY: 1-2x/week  PT DURATION: 8 weeks  PLANNED INTERVENTIONS: 97164- PT Re-evaluation, 97110-Therapeutic exercises, 97530- Therapeutic activity, 97112- Neuromuscular re-education, 97535- Self Care, 08657- Manual therapy, [phone]- Gait training, [phone]- Orthotic Fit/training, [phone]- Canalith repositioning, [id number]- Aquatic Therapy, [phone]- Splinting, Patient/Family education, Balance training, Stair training, Taping, Dry Needling, Joint mobilization, Joint manipulation, Spinal manipulation, Spinal mobilization, Scar mobilization, and DME instructions.   PLAN FOR NEXT SESSION: possibly manual to periscap, postural and UE strengthening   Beather Liming, PT, DPT 01/23/2024, 4:01 PM   Date of referral: 12/17/23 Referring provider: Annella Kief, NP Referring diagnosis? M75.41 (ICD-10-CM) - Internal impingement of right shoulder Treatment diagnosis? (if different than referring diagnosis) M25.511 Pain in R shoulder  What was this (referring dx) caused by? Ongoing Issue  Lonne Roan  of Condition: Initial Onset (within last 3 months)   Laterality: Rt  Current Functional Measure Score: Other UEFS:  42/80   Objective measurements identify impairments when they are compared to normal values, the uninvolved extremity, and prior level of function.  [x]  Yes  []  No  Objective assessment of functional ability: Moderate functional limitations   Briefly describe symptoms: pain in R shoulder blade/neck with R radicular symptoms  How did symptoms start: aquatic exercise/insidious onset  Average pain intensity:  Last 24 hours: 3-4/10  Past week: 3-4/10  How often does the pt experience symptoms? Constantly  How much have the symptoms interfered with usual daily activities? Quite a bit  How has condition changed since care began at this facility? NA - initial visit  In general, how is the patients overall health? Good   BACK  PAIN (STarT Back Screening Tool) No

## 2024-01-24 ENCOUNTER — Other Ambulatory Visit (HOSPITAL_BASED_OUTPATIENT_CLINIC_OR_DEPARTMENT_OTHER): Payer: Self-pay

## 2024-01-24 ENCOUNTER — Telehealth: Payer: Self-pay

## 2024-01-24 ENCOUNTER — Other Ambulatory Visit (HOSPITAL_COMMUNITY): Payer: Self-pay

## 2024-01-24 ENCOUNTER — Ambulatory Visit: Attending: Internal Medicine | Admitting: Pharmacist

## 2024-01-24 ENCOUNTER — Other Ambulatory Visit: Payer: Self-pay

## 2024-01-24 ENCOUNTER — Telehealth: Payer: Self-pay | Admitting: Pharmacist

## 2024-01-24 ENCOUNTER — Telehealth: Payer: Self-pay | Admitting: Pharmacy Technician

## 2024-01-24 ENCOUNTER — Encounter: Payer: Self-pay | Admitting: Pharmacist

## 2024-01-24 DIAGNOSIS — E785 Hyperlipidemia, unspecified: Secondary | ICD-10-CM | POA: Diagnosis not present

## 2024-01-24 DIAGNOSIS — E782 Mixed hyperlipidemia: Secondary | ICD-10-CM

## 2024-01-24 DIAGNOSIS — E1169 Type 2 diabetes mellitus with other specified complication: Secondary | ICD-10-CM

## 2024-01-24 MED ORDER — EZETIMIBE 10 MG PO TABS
10.0000 mg | ORAL_TABLET | Freq: Every day | ORAL | 3 refills | Status: DC
  Filled 2024-01-24: qty 90, 90d supply, fill #0

## 2024-01-24 MED ORDER — NEXLETOL 180 MG PO TABS
1.0000 | ORAL_TABLET | Freq: Every day | ORAL | 11 refills | Status: DC
  Filled 2024-01-24: qty 30, 30d supply, fill #0

## 2024-01-24 NOTE — Telephone Encounter (Signed)
 PA for nexletol approved see other encounter for more info

## 2024-01-24 NOTE — Assessment & Plan Note (Signed)
 Assessment: LDL goal: <55 mg/dL; last LDL cholesterol 621 mg/dL (30/8657) Patient tolerates Repatha  well without side effects History of intolerance to various statins, with severe lower leg muscle cramps and worsening neuropathy Discussed alternative options including bempedoic acid (Nexletol) and ezetimibe--covered cost, dosing, efficacy, and potential side effects Elevated triglycerides likely due to uncontrolled blood glucose; patient unable to tolerate higher doses of Ozempic  (above 0.25 mg)  Plan: Continue current medications: Repatha  140 mg every 14 days Submit prior authorization (PA) for Nexletol; will notify patient upon approval Obtain baseline uric acid lab today prior to starting Nexletol and repeat in 1 month  Initiate ezetimibe 10 mg daily; will conduct phone follow-up to assess tolerability Repeat lipid panel in 2-3 months following therapy adjustment

## 2024-01-24 NOTE — Progress Notes (Signed)
   01/24/2024  Patient ID: Terri Heath, female   DOB: 22-Jan-1956, 68 y.o.   MRN: [id number]  Patient brought in her proof of income. Submitted to [employer] for processing on her Lantus  application.  Carnell Christian, PharmD Clinical Pharmacist [phone]

## 2024-01-24 NOTE — Progress Notes (Signed)
 Patient ID: Terri Heath                 DOB: 08-01-1956                    MRN: [id number]      HPI: Terri Heath is a 68 y.o. female patient referred to lipid clinic by Dr. Mitzie Anda. PMH is significant for  hypertension, T2DM, HLD, HF, statin myopathy, OSA, and obesity   Patient presented today for lipid follow up. In the past provided weight loss/ DM - GLP1 consultation but patient preferred to stay on Ozempic  which was initiated by PCP ( has PA approved and on patient assistance program initiated by PCP) pt does not want to change to Mounjaro . Patient reports she can not tolerate Ozempic  any dose higher than 0.25 mg due to stomach sickness. Reports "I have been diabetic for may years and I have made enough changes to my diet, does not want to discuss that further". She reports Repatha  hurts a lot. She still takes it. Fill hx shows non-adherence. She knows her TG level is high due to her having uncontrolled BG -she states she is doing her best to keep BG down. She dose not want re-try any statins as they all caused severe muscle cramps in her lower legs and worsened her neuropathy. She doesn't recall name of previously tried statins. Today we reviewed ezetimibe, bempedoic acid and inclisiran.  Discussed mechanisms of action, dosing, side effects and potential decreases in LDL cholesterol.  Also reviewed cost information and potential options for patient assistance.  Pt is in agreement to try Zetia and in 2 weeks nexletol.  Current Medications: Repatha  140 mg Every 14 days  Intolerances: statins - lower legs cramps  Risk Factors: obesity, poorly controlled diabetes, HTN, CHF)  LDL goal: <55 mg/dl     Exercise: none due to diabetic neuropathy   Family History:  Relation Problem Comments  Mother (Deceased at age 25) Alcohol abuse   Anxiety disorder   Arthritis   Diabetes   Eating disorder   Obesity   Sleep apnea     Father (Deceased at age 27) Alcohol abuse   Cancer   Heart  disease   Hypertension   Stroke     Sister Metallurgist) Sleep apnea       Labs:  Lipid Panel     Component Value Date/Time   CHOL 254 (H) 09/24/2023 1617   CHOL 181 05/03/2023 1210   TRIG 182 (H) 09/24/2023 1617   HDL 45 09/24/2023 1617   HDL 53 05/03/2023 1210   CHOLHDL 5.6 09/24/2023 1617   VLDL 36 09/24/2023 1617   LDLCALC 173 (H) 09/24/2023 1617   LDLCALC 89 05/03/2023 1210   LABVLDL 39 05/03/2023 1210    Past Medical History:  Diagnosis Date   Abnormal vaginal bleeding in postmenopausal patient 10/11/2021   Acute systolic heart failure (HCC)    Allergy    Asthma    Bilateral shoulder pain    Borderline hypertension    Bronchitis    CAD (coronary artery disease)    Chronic systolic CHF (congestive heart failure) (HCC)    Chronic systolic congestive heart failure, NYHA class 3 (HCC) 07/24/2018   Closed fracture of distal fibula 07/17/2022   Dehydration 12/20/2021   Diabetes mellitus    Diagnosed in 2000, on insulin , Trulicity  and metformin , not checking cgs at home   Drug-induced hypotension 12/20/2021   Elevated serum creatinine 12/20/2021   Encounter  to establish care 01/06/2021   Gangrenous appendicitis with perforation s/p lap appendectomy 10/04/2017 10/04/2017   GERD (gastroesophageal reflux disease)    History of MI (myocardial infarction)    History of radiation therapy    Uterus- 12/14/21-02/01/22- Dr. Retta Caster   Hyperlipidemia    Hypertension    Hypothyroidism    MGUS (monoclonal gammopathy of unknown significance)    Mitral regurgitation    Myocardial infarction [facility])    Neuropathy    Obesity    Rash of back 01/06/2021   Sleep apnea    uses CPAP   Statin intolerance    Uterine cancer (HCC)     Current Outpatient Medications on File Prior to Visit  Medication Sig Dispense Refill   acetaminophen  (TYLENOL ) 500 MG tablet Take 500-1,000 mg by mouth every 6 (six) hours as needed for mild pain or moderate pain.     albuterol  (PROAIR  HFA) 108 (90  Base) MCG/ACT inhaler Inhale 2 puffs into the lungs every 6 (six) hours as needed for wheezing (Patient not taking: Reported on 01/17/2024) 13.4 g 2   aspirin  81 MG chewable tablet Chew 1 tablet (81 mg total) by mouth daily. 90 tablet 3   blood glucose meter kit and supplies Use up to four times daily as directed. (FOR ICD-10 E10.9, E11.9). 1 each 99   carvedilol  (COREG ) 6.25 MG tablet Take 1 tablet (6.25 mg total) by mouth 2 (two) times daily. Needs 180 tablet 1   diclofenac  Sodium (VOLTAREN ) 1 % GEL Apply 2g to the affected area(s) topically as needed. 300 g 3   Evolocumab  (REPATHA  SURECLICK) 140 MG/ML SOAJ Inject 140 mg into the skin every 14 (fourteen) days. 6 mL 3   furosemide  (LASIX ) 20 MG tablet Take 1 tablet (20 mg total) by mouth daily. 90 tablet 1   glucose blood test strip Use to check blood sugar up to 4 times daily 100 each PRN   insulin  glargine (LANTUS  SOLOSTAR) 100 UNIT/ML Solostar Pen Inject 25-40 Units into the skin daily. 45 mL 3   Insulin  Pen Needle (BD PEN NEEDLE NANO 2ND GEN) 32G X 4 MM MISC Use as directed daily. 100 each 3   levothyroxine  (SYNTHROID ) 50 MCG tablet Take 1 tablet (50 mcg total) by mouth daily, before a meal. 90 tablet 1   lidocaine  (LIDODERM ) 5 % Place 1 patch onto the shoulder daily. Remove & Discard patch within 12 hours or as directed by your provider. (Patient not taking: Reported on 01/17/2024) 30 patch 3   MAGNESIUM  GLYCINATE PO Take 1 tablet by mouth daily.     metFORMIN  (GLUCOPHAGE ) 1000 MG tablet Take 1 tablet (1,000 mg total) by mouth 2 (two) times daily with a meal. 180 tablet 1   Multiple Vitamin (MULITIVITAMIN WITH MINERALS) TABS Take 1 tablet by mouth daily with breakfast.     naproxen  sodium (ALEVE ) 220 MG tablet Take 440 mg by mouth daily as needed (pain). (Patient not taking: Reported on 01/17/2024)     OneTouch Delica Lancets 33G MISC Use to check blood sugars 4 (four) times daily. 100 each 1   sacubitril -valsartan  (ENTRESTO ) 24-26 MG Take 1  tablet by mouth 2 (two) times daily. 180 tablet 1   Semaglutide ,0.25 or 0.5MG /DOS, (OZEMPIC , 0.25 OR 0.5 MG/DOSE,) 2 MG/3ML SOPN Inject into the skin.     spironolactone  (ALDACTONE ) 25 MG tablet Take 1 tablet (25 mg total) by mouth daily. 90 tablet 1   triamcinolone  ointment (KENALOG ) 0.1 % Apply 1 application topically two times  daily to affected area(s) as needed, sparing use to avoid whitening/thinning skin (Patient not taking: Reported on 01/17/2024) 30 g 1   [DISCONTINUED] insulin  detemir (LEVEMIR ) 100 UNIT/ML FlexPen Inject 10 Units into the skin daily. 15 mL 11   No current facility-administered medications on file prior to visit.    Allergies  Allergen Reactions   Oxycodone  Hcl Other (See Comments)    Severe hallucinations   Amoxicillin  Other (See Comments)    Dizziness, abdominal pain   Adhesive [Tape] Rash    Tegaderm   Exenatide Other (See Comments)    Flu-like symptoms   Invokana [Canagliflozin] Other (See Comments)    Yeast infection/dehydration   Jardiance  [Empagliflozin ] Other (See Comments)    Yeast infections and dehydration   Lisinopril  Other (See Comments)    cramping   Other Diarrhea and Other (See Comments)    Kale=food  Per patient, it causes GI bleeding    Statins Other (See Comments)    Leg cramps    Assessment/Plan:  1. Hyperlipidemia -  Problem  Hyperlipidemia Associated With Type 2 Diabetes Mellitus (Hcc)   Qualifier: Diagnosis of  By: Marthe Slain     Hyperlipidemia associated with type 2 diabetes mellitus (HCC) Assessment: LDL goal: <55 mg/dL; last LDL cholesterol 725 mg/dL (36/6440) Patient tolerates Repatha  well without side effects History of intolerance to various statins, with severe lower leg muscle cramps and worsening neuropathy Discussed alternative options including bempedoic acid (Nexletol) and ezetimibe--covered cost, dosing, efficacy, and potential side effects Elevated triglycerides likely due to uncontrolled blood  glucose; patient unable to tolerate higher doses of Ozempic  (above 0.25 mg)  Plan: Continue current medications: Repatha  140 mg every 14 days Submit prior authorization (PA) for Nexletol; will notify patient upon approval Obtain baseline uric acid lab today prior to starting Nexletol and repeat in 1 month  Initiate ezetimibe 10 mg daily; will conduct phone follow-up to assess tolerability Repeat lipid panel in 2-3 months following therapy adjustment    Thank you,  Nickola Baron, Pharm.D [place] [facility] [address], [place], [place] [address] Phone: [phone]; Fax: [phone]

## 2024-01-24 NOTE — Patient Instructions (Signed)
 Your Results:             Your most recent labs Goal  Total Cholesterol 217 < 200  Triglycerides 188 < 150  HDL (happy/good cholesterol) 48 > 40  LDL (lousy/bad cholesterol 135 < 55   Medication changes: continue taking Repatha  every 14 days and start taking ezetimibe 10 mg mg daily for 2 weeks if tolerated well start taking Nexletol 180 mg daily . Baseline Uric acid lab is due today and will repeat in 1 month     Lab orders: We want to repeat lipid labs after 2-3 months.  We will send you a lab order to remind you once we get closer to that time.

## 2024-01-24 NOTE — Telephone Encounter (Signed)
 Pharmacy Patient Advocate Encounter   Received notification from Pt Calls Messages that prior authorization for nexletol is required/requested.   Insurance verification completed.   The patient is insured through [place] .   Per test claim: PA required; PA submitted to above mentioned insurance via CoverMyMeds Key/confirmation #/EOC [id number] Status is pending

## 2024-01-24 NOTE — Telephone Encounter (Signed)
 Pharmacy Patient Advocate Encounter  Received notification from [facility] that Prior Authorization for nexletol has been APPROVED from 01/24/24 to 07/26/24. Unable to obtain price due to refill too soon rejection, last fill date 01/24/24 next available fill date06/21/25   PA #/Case ID/Reference #: [id number]

## 2024-01-25 ENCOUNTER — Ambulatory Visit: Payer: Self-pay | Admitting: Pharmacist

## 2024-01-25 ENCOUNTER — Other Ambulatory Visit (HOSPITAL_BASED_OUTPATIENT_CLINIC_OR_DEPARTMENT_OTHER): Payer: Self-pay

## 2024-01-25 LAB — URIC ACID: Uric Acid: 7.8 mg/dL — ABNORMAL HIGH (ref 3.0–7.2)

## 2024-01-25 MED ORDER — REPATHA SURECLICK 140 MG/ML ~~LOC~~ SOAJ
140.0000 mg | SUBCUTANEOUS | 3 refills | Status: AC
  Filled 2024-01-25: qty 2, 28d supply, fill #0
  Filled 2024-03-05 – 2024-03-06 (×2): qty 2, 28d supply, fill #1
  Filled 2024-04-01: qty 2, 28d supply, fill #2
  Filled 2024-04-11 – 2024-04-24 (×2): qty 2, 28d supply, fill #3
  Filled 2024-05-30: qty 2, 28d supply, fill #4
  Filled 2024-06-24: qty 2, 28d supply, fill #5
  Filled 2024-07-17: qty 2, 28d supply, fill #6
  Filled 2024-08-01 – 2024-08-07 (×3): qty 2, 28d supply, fill #7
  Filled 2024-09-23: qty 2, 28d supply, fill #8

## 2024-01-28 ENCOUNTER — Other Ambulatory Visit (HOSPITAL_BASED_OUTPATIENT_CLINIC_OR_DEPARTMENT_OTHER): Payer: Self-pay

## 2024-01-30 ENCOUNTER — Ambulatory Visit (HOSPITAL_BASED_OUTPATIENT_CLINIC_OR_DEPARTMENT_OTHER): Attending: Nurse Practitioner | Admitting: Physical Therapy

## 2024-01-30 ENCOUNTER — Encounter (HOSPITAL_BASED_OUTPATIENT_CLINIC_OR_DEPARTMENT_OTHER): Payer: Self-pay | Admitting: Physical Therapy

## 2024-01-30 DIAGNOSIS — R2689 Other abnormalities of gait and mobility: Secondary | ICD-10-CM | POA: Diagnosis not present

## 2024-01-30 DIAGNOSIS — R29898 Other symptoms and signs involving the musculoskeletal system: Secondary | ICD-10-CM | POA: Diagnosis not present

## 2024-01-30 DIAGNOSIS — M6281 Muscle weakness (generalized): Secondary | ICD-10-CM

## 2024-01-30 DIAGNOSIS — M25511 Pain in right shoulder: Secondary | ICD-10-CM

## 2024-01-30 DIAGNOSIS — R262 Difficulty in walking, not elsewhere classified: Secondary | ICD-10-CM | POA: Diagnosis not present

## 2024-01-30 NOTE — Telephone Encounter (Signed)
 Ozempic  received, left message for pt.

## 2024-01-30 NOTE — Therapy (Signed)
 OUTPATIENT PHYSICAL THERAPY SHOULDER EVALUATION   Patient Name: Terri Heath MRN: [id number] DOB:[birth year], 68 y.o., female Today's Date: 01/30/2024  END OF SESSION:  PT End of Session - 01/30/24 1522     Visit Number 2    Number of Visits 16    Date for PT Re-Evaluation 03/19/24    Authorization Type [employer]    Progress Note Due on Visit 10    PT Start Time 1516    PT Stop Time 1605    PT Time Calculation (min) 49 min    Activity Tolerance Patient tolerated treatment well    Behavior During Therapy WFL for tasks assessed/performed             Past Medical History:  Diagnosis Date   Abnormal vaginal bleeding in postmenopausal patient 10/11/2021   Acute systolic heart failure (HCC)    Allergy    Asthma    Bilateral shoulder pain    Borderline hypertension    Bronchitis    CAD (coronary artery disease)    Chronic systolic CHF (congestive heart failure) (HCC)    Chronic systolic congestive heart failure, NYHA class 3 (HCC) 07/24/2018   Closed fracture of distal fibula 07/17/2022   Dehydration 12/20/2021   Diabetes mellitus    Diagnosed in 2000, on insulin , Trulicity  and metformin , not checking cgs at home   Drug-induced hypotension 12/20/2021   Elevated serum creatinine 12/20/2021   Encounter to establish care 01/06/2021   Gangrenous appendicitis with perforation s/p lap appendectomy 10/04/2017 10/04/2017   GERD (gastroesophageal reflux disease)    History of MI (myocardial infarction)    History of radiation therapy    Uterus- 12/14/21-02/01/22- Dr. Retta Caster   Hyperlipidemia    Hypertension    Hypothyroidism    MGUS (monoclonal gammopathy of unknown significance)    Mitral regurgitation    Myocardial infarction (HCC)    Neuropathy    Obesity    Rash of back 01/06/2021   Sleep apnea    uses CPAP   Statin intolerance    Uterine cancer [facility])    Past Surgical History:  Procedure Laterality Date   BREATH TEK H PYLORI  09/18/2011    Procedure: BREATH TEK H PYLORI;  Surgeon: Thayne Fine, MD;  Location: [facility];  Service: General;  Laterality: N/A;   CESAREAN SECTION     x 3   IR IMAGING GUIDED PORT INSERTION  12/07/2021   IR REMOVAL TUN ACCESS W/ PORT W/O FL MOD SED  04/04/2022   LAPAROSCOPIC APPENDECTOMY N/A 10/03/2017   Procedure: APPENDECTOMY LAPAROSCOPIC;  Surgeon: Joyce Nixon, MD;  Location: [facility];  Service: General;  Laterality: N/A;   OPERATIVE ULTRASOUND N/A 02/01/2022   Procedure: OPERATIVE ULTRASOUND;  Surgeon: Retta Caster, MD;  Location: [facility];  Service: Urology;  Laterality: N/A;   RIGHT/LEFT HEART CATH AND CORONARY ANGIOGRAPHY N/A 02/12/2018   Procedure: RIGHT/LEFT HEART CATH AND CORONARY ANGIOGRAPHY;  Surgeon: Lucendia Rusk, MD;  Location: [facility] INVASIVE CV LAB;  Service: Cardiovascular;  Laterality: N/A;   ROBOTIC ASSISTED TOTAL HYSTERECTOMY WITH BILATERAL SALPINGO OOPHERECTOMY Bilateral 03/21/2022   Procedure: XI ROBOTIC ASSISTED TOTAL HYSTERECTOMY WITH BILATERAL SALPINGO OOPHORECTOMY, LYSIS OF ADHESIONS, CYSTOSCOPY;  Surgeon: Suzi Essex, MD;  Location: [facility];  Service: Gynecology;  Laterality: Bilateral;   TANDEM RING INSERTION N/A 02/01/2022   Procedure: TANDEM RING INSERTION;  Surgeon: Retta Caster, MD;  Location: [facility];  Service: Urology;  Laterality: N/A;   TUBAL LIGATION  Patient Active Problem List   Diagnosis Date Noted   Internal impingement of right shoulder 12/17/2023   Sciatic nerve pain 08/28/2023   Cellulitis of perineum 06/11/2023   Hyperglycemia due to diabetes mellitus (HCC) 06/11/2023   Labial abscess 06/07/2023   Statin myopathy 05/03/2023   Hypomagnesemia 01/17/2022   Pancytopenia, acquired (HCC) 01/03/2022   Diabetic peripheral neuropathy associated with type 2 diabetes mellitus (HCC) 12/02/2021   History of endometrial cancer 11/07/2021   Vitamin D deficiency 01/06/2021   Claudication in peripheral vascular disease (HCC) 09/16/2020   Chronic systolic CHF  (congestive heart failure), NYHA class 3 (HCC) 07/24/2018   Acquired hypothyroidism 07/24/2018   Cardiomyopathy, ischemic 07/24/2018   MGUS (monoclonal gammopathy of unknown significance) 03/06/2018   Dyspnea 01/14/2018   Allergic rhinitis 06/11/2014   Asthma due to seasonal allergies 10/24/2012   OSA (obstructive sleep apnea) 05/23/2011   Poorly controlled type 2 diabetes mellitus with circulatory disorder (HCC) 03/26/2007   Hyperlipidemia associated with type 2 diabetes mellitus (HCC) 03/26/2007   Obesity, Class III, BMI 40-49.9 (morbid obesity) 03/26/2007   Hypertension associated with type 2 diabetes mellitus (HCC) 03/26/2007    PCP: Annella Kief, NP  REFERRING PROVIDER: Annella Kief, NP  REFERRING DIAG: M75.41 (ICD-10-CM) - Internal impingement of right shoulder  THERAPY DIAG:  Right shoulder pain, unspecified chronicity  Muscle weakness (generalized)  Other abnormalities of gait and mobility  Other symptoms and signs involving the musculoskeletal system  Rationale for Evaluation and Treatment: Rehabilitation  ONSET DATE: 2-3 months  SUBJECTIVE:                                                                                                                                                                                      SUBJECTIVE STATEMENT: Patient states heat makes it feel better. Radiating pain with ants crawling on skin feeling.   Did PT a year ago for her knee and did great. Had a few months where she couldn't do the pool. Spend some time with health issues. Back in pool but some upper back symptoms. RUE tingling/numbness. Wants to build upper body strength. Symptoms began about 2-3 months ago with walking in pool.  Hand dominance: Right  PERTINENT HISTORY: CHF, DM neuropathy (sock distribution), CAD, previous uterine cancer  PAIN:  Are you having pain? Yes: NPRS scale: 4/10 Pain location: R periscap Pain description: ache with RUE  numb/tingling Aggravating factors: aquatics Relieving factors: meds, heat  PRECAUTIONS: Other: Adhesive Allergy*  RED FLAGS: None   WEIGHT BEARING RESTRICTIONS: No  FALLS:  Has patient fallen in last 6 months? No  OCCUPATION: previously [employer], EKG   PLOF: Independent  PATIENT  GOALS:pain management/overcome pain   OBJECTIVE: (objective measures from initial evaluation unless otherwise dated)  PATIENT SURVEYS: UEFS  Extreme difficulty/unable (0), Quite a bit of difficulty (1), Moderate difficulty (2), Little difficulty (3), No difficulty (4) Survey date:  01/23/24  Any of your usual work, household or school activities 2  2. Your usual hobbies, recreational/sport activities 1   3. Lifting a bag of groceries to waist level 3   4. Lifting a bag of groceries above your head 1  5. Grooming your hair 3  6. Pushing up on your hands (I.e. from bathtub or chair) 2  7. Preparing food (I.e. peeling/cutting) 1  8. Driving  3  9. Vacuuming, sweeping, or raking 1  10. Dressing  3  11. Doing up buttons 3  12. Using tools/appliances 2  13. Opening doors 3  14. Cleaning  3  15. Tying or lacing shoes 2  16. Sleeping  1  17. Laundering clothes (I.e. washing, ironing, folding) 2  18. Opening a jar 2  19. Throwing a ball 2  20. Carrying a small suitcase with your affected limb.  2  Score total:  42/80     COGNITION: Overall cognitive status: Within functional limits for tasks assessed     SENSATION: Impaired RUE in wrist extensor region and lateral 3 fingers posteriorly   POSTURE: rounded shoulders, forward head, increased thoracic kyphosis, anterior pelvic tilt, and flexed trunk    UPPER EXTREMITY ROM: WFL bilateral UE  Cervical AROM:    01/23/2024      Flexion  0% limited * (worst)     Extension  25% limited *     R ROT 0% limited     L ROT  0% limited     R SB   50% limited*    L SB 25% limited     * symptoms (RUE radicular symptoms) (Blank rows = not  tested)    UPPER EXTREMITY MMT:  MMT Right eval Left eval  Shoulder flexion 4+ 4+  Shoulder extension    Shoulder abduction 4+ 4+  Shoulder adduction    Shoulder internal rotation 5 5  Shoulder external rotation 4+ 4+  Middle trapezius    Lower trapezius    Elbow flexion 4+ 5  Elbow extension 5 5  Wrist flexion 5 5  Wrist extension 5 5  Wrist ulnar deviation    Wrist radial deviation    Wrist pronation    Wrist supination    Grip strength (lbs) 42# 22#  (Blank rows = not tested) *=pain/symptoms     PALPATION:  TTP bilateral periscap musculature with R radicular symptoms with palpation of R levator scap, rhomboids, middle trap, UT   TODAY'S TREATMENT:  DATE:  01/30/24 Manual: Inferior glades first rib on R; STM to UT, levator scapulae, infraspinatus  Standing row RTB 2 x 10 Seated cervical retractions 2 x 10  01/23/24 Eval, education, HEP  PATIENT EDUCATION:  Education details: Patient educated on exam findings, POC, scope of PT, HEP, relevant anatomy and biomechanics, and self STM with tennis ball. Person educated: Patient Education method: Explanation, Demonstration, and Handouts Education comprehension: verbalized understanding, returned demonstration, verbal cues required, and tactile cues required  HOME EXERCISE PROGRAM: Access Code: [email or web address] URL: [email or web address] Date: 01/23/2024 Prepared by: Debria Fang Pria Klosinski  Exercises - Seated Scapular Retraction  - 2 x daily - 7 x weekly - 2 sets - 10 reps  ASSESSMENT:  CLINICAL IMPRESSION: Patient with hypomobile first rib, manual improves "ant" symptoms but continued ache in RUE. Radicular ache improves with STM and in particular with STM to infraspinatus. Ant feeling resolves, forarm symptoms decrease to small area and ache in index finger present following.  Waxing and waning of symptoms with cervical retractions. Improvement in symptoms during session which increase to baseline at EOS.  Patient will continue to benefit from physical therapy in order to improve function and reduce impairment.   OBJECTIVE IMPAIRMENTS: decreased activity tolerance, decreased endurance, decreased mobility, decreased ROM, decreased strength, increased muscle spasms, impaired flexibility, improper body mechanics, postural dysfunction, and pain.   ACTIVITY LIMITATIONS: carrying, lifting, bending, stairs, transfers, bathing, reach over head, hygiene/grooming, and caring for others  PARTICIPATION LIMITATIONS: meal prep, cleaning, laundry, shopping, community activity, and yard work  PERSONAL FACTORS: Fitness, Time since onset of injury/illness/exacerbation, and 3+ comorbidities: CHF, DM neuropathy (sock distribution), CAD, previous uterine cancer are also affecting patient's functional outcome.   REHAB POTENTIAL: Good  CLINICAL DECISION MAKING: Evolving/moderate complexity  EVALUATION COMPLEXITY: Moderate   GOALS: Goals reviewed with patient? Yes  SHORT TERM GOALS: Target date: 02/20/2024    Patient will be independent with HEP in order to improve functional outcomes. Baseline: Goal status: INITIAL  2.  Patient will report at least 25% improvement in symptoms for improved quality of life. Baseline:  Goal status: INITIAL    LONG TERM GOALS: Target date: 03/19/2024    Patient will report at least 75% improvement in symptoms for improved quality of life. Baseline:  Goal status: INITIAL  2.  Patient will improve UEFS score by at least 9 points in order to indicate improved tolerance to activity. Baseline:  Goal status: INITIAL  3.  Patient will have reduction in tissue tension in R periscap region for reduction in symptoms. Baseline:  Goal status: INITIAL  4.  Patient will be able to return to all activities unrestricted for improved ability to  exercise. Baseline:  Goal status: INITIAL  5.  Patient will demonstrate grade of 5/5 MMT grade in all tested musculature as evidence of improved strength to assist with lifting at home. Baseline:  Goal status: INITIAL   PLAN:  PT FREQUENCY: 1-2x/week  PT DURATION: 8 weeks  PLANNED INTERVENTIONS: 97164- PT Re-evaluation, 97110-Therapeutic exercises, 97530- Therapeutic activity, 97112- Neuromuscular re-education, 97535- Self Care, 29562- Manual therapy, [phone]- Gait training, [phone]- Orthotic Fit/training, [phone]- Canalith repositioning, [id number]- Aquatic Therapy, [phone]- Splinting, Patient/Family education, Balance training, Stair training, Taping, Dry Needling, Joint mobilization, Joint manipulation, Spinal manipulation, Spinal mobilization, Scar mobilization, and DME instructions.   PLAN FOR NEXT SESSION: possibly manual to periscap, postural and UE strengthening   Beather Liming, PT, DPT 01/30/2024, 4:08 PM   Date of referral: 12/17/23 Referring provider: Annella Kief, NP Referring diagnosis? M75.41 (  ICD-10-CM) - Internal impingement of right shoulder Treatment diagnosis? (if different than referring diagnosis) M25.511 Pain in R shoulder  What was this (referring dx) caused by? Ongoing Issue  Lonne Roan of Condition: Initial Onset (within last 3 months)   Laterality: Rt  Current Functional Measure Score: Other UEFS:  42/80   Objective measurements identify impairments when they are compared to normal values, the uninvolved extremity, and prior level of function.  [x]  Yes  []  No  Objective assessment of functional ability: Moderate functional limitations   Briefly describe symptoms: pain in R shoulder blade/neck with R radicular symptoms  How did symptoms start: aquatic exercise/insidious onset  Average pain intensity:  Last 24 hours: 3-4/10  Past week: 3-4/10  How often does the pt experience symptoms? Constantly  How much have the symptoms interfered with usual daily  activities? Quite a bit  How has condition changed since care began at this facility? NA - initial visit  In general, how is the patients overall health? Good   BACK PAIN (STarT Back Screening Tool) No

## 2024-02-05 ENCOUNTER — Telehealth: Payer: Self-pay | Admitting: Nurse Practitioner

## 2024-02-05 NOTE — Telephone Encounter (Signed)
 Copied from CRM [id number]. Topic: Appointments - Scheduling Inquiry for Clinic >> Feb 05, 2024  9:01 AM Antwanette L wrote: Reason for CRM: The patient has an appt on 8/11 at 2:45pm for a dermatology referral. The patient would like to come in as soon as possible. The patient would like to go to [facility] on [address], [place], [place]. The patient has been there before. The patient can be contacted through MyChart and by phone at [phone].

## 2024-02-05 NOTE — Telephone Encounter (Signed)
 Please schedule patient for a sooner appt to discuss a dermatology referral if patient can't wait until 8/11 to see sarabeth to discuss a referral. We can not refer without her being seen

## 2024-02-06 ENCOUNTER — Encounter (HOSPITAL_BASED_OUTPATIENT_CLINIC_OR_DEPARTMENT_OTHER): Payer: Self-pay | Admitting: Physical Therapy

## 2024-02-06 ENCOUNTER — Ambulatory Visit (HOSPITAL_BASED_OUTPATIENT_CLINIC_OR_DEPARTMENT_OTHER): Admitting: Physical Therapy

## 2024-02-06 DIAGNOSIS — M25511 Pain in right shoulder: Secondary | ICD-10-CM

## 2024-02-06 DIAGNOSIS — R29898 Other symptoms and signs involving the musculoskeletal system: Secondary | ICD-10-CM | POA: Diagnosis not present

## 2024-02-06 DIAGNOSIS — R262 Difficulty in walking, not elsewhere classified: Secondary | ICD-10-CM | POA: Diagnosis not present

## 2024-02-06 DIAGNOSIS — M6281 Muscle weakness (generalized): Secondary | ICD-10-CM

## 2024-02-06 DIAGNOSIS — R2689 Other abnormalities of gait and mobility: Secondary | ICD-10-CM | POA: Diagnosis not present

## 2024-02-06 NOTE — Therapy (Signed)
 OUTPATIENT PHYSICAL THERAPY SHOULDER EVALUATION   Patient Name: Terri Heath MRN: [id number] DOB:31-Aug-1955, 68 y.o., female Today's Date: 02/06/2024  END OF SESSION:  PT End of Session - 02/06/24 1516     Visit Number 3    Number of Visits 16    Date for PT Re-Evaluation 03/19/24    Authorization Type [employer]    Progress Note Due on Visit 10    PT Start Time 1514    PT Stop Time 1553    PT Time Calculation (min) 39 min    Activity Tolerance Patient tolerated treatment well    Behavior During Therapy WFL for tasks assessed/performed             Past Medical History:  Diagnosis Date   Abnormal vaginal bleeding in postmenopausal patient 10/11/2021   Acute systolic heart failure (HCC)    Allergy    Asthma    Bilateral shoulder pain    Borderline hypertension    Bronchitis    CAD (coronary artery disease)    Chronic systolic CHF (congestive heart failure) (HCC)    Chronic systolic congestive heart failure, NYHA class 3 (HCC) 07/24/2018   Closed fracture of distal fibula 07/17/2022   Dehydration 12/20/2021   Diabetes mellitus    Diagnosed in 2000, on insulin , Trulicity  and metformin , not checking cgs at home   Drug-induced hypotension 12/20/2021   Elevated serum creatinine 12/20/2021   Encounter to establish care 01/06/2021   Gangrenous appendicitis with perforation s/p lap appendectomy 10/04/2017 10/04/2017   GERD (gastroesophageal reflux disease)    History of MI (myocardial infarction)    History of radiation therapy    Uterus- 12/14/21-02/01/22- Dr. Retta Caster   Hyperlipidemia    Hypertension    Hypothyroidism    MGUS (monoclonal gammopathy of unknown significance)    Mitral regurgitation    Myocardial infarction (HCC)    Neuropathy    Obesity    Rash of back 01/06/2021   Sleep apnea    uses CPAP   Statin intolerance    Uterine cancer [facility])    Past Surgical History:  Procedure Laterality Date   BREATH TEK H PYLORI  09/18/2011    Procedure: BREATH TEK H PYLORI;  Surgeon: Thayne Fine, MD;  Location: [facility];  Service: General;  Laterality: N/A;   CESAREAN SECTION     x 3   IR IMAGING GUIDED PORT INSERTION  12/07/2021   IR REMOVAL TUN ACCESS W/ PORT W/O FL MOD SED  04/04/2022   LAPAROSCOPIC APPENDECTOMY N/A 10/03/2017   Procedure: APPENDECTOMY LAPAROSCOPIC;  Surgeon: Joyce Nixon, MD;  Location: [facility];  Service: General;  Laterality: N/A;   OPERATIVE ULTRASOUND N/A 02/01/2022   Procedure: OPERATIVE ULTRASOUND;  Surgeon: Retta Caster, MD;  Location: [facility];  Service: Urology;  Laterality: N/A;   RIGHT/LEFT HEART CATH AND CORONARY ANGIOGRAPHY N/A 02/12/2018   Procedure: RIGHT/LEFT HEART CATH AND CORONARY ANGIOGRAPHY;  Surgeon: Lucendia Rusk, MD;  Location: [facility] INVASIVE CV LAB;  Service: Cardiovascular;  Laterality: N/A;   ROBOTIC ASSISTED TOTAL HYSTERECTOMY WITH BILATERAL SALPINGO OOPHERECTOMY Bilateral 03/21/2022   Procedure: XI ROBOTIC ASSISTED TOTAL HYSTERECTOMY WITH BILATERAL SALPINGO OOPHORECTOMY, LYSIS OF ADHESIONS, CYSTOSCOPY;  Surgeon: Suzi Essex, MD;  Location: [facility];  Service: Gynecology;  Laterality: Bilateral;   TANDEM RING INSERTION N/A 02/01/2022   Procedure: TANDEM RING INSERTION;  Surgeon: Retta Caster, MD;  Location: [facility];  Service: Urology;  Laterality: N/A;   TUBAL LIGATION  Patient Active Problem List   Diagnosis Date Noted   Internal impingement of right shoulder 12/17/2023   Sciatic nerve pain 08/28/2023   Cellulitis of perineum 06/11/2023   Hyperglycemia due to diabetes mellitus (HCC) 06/11/2023   Labial abscess 06/07/2023   Statin myopathy 05/03/2023   Hypomagnesemia 01/17/2022   Pancytopenia, acquired (HCC) 01/03/2022   Diabetic peripheral neuropathy associated with type 2 diabetes mellitus (HCC) 12/02/2021   History of endometrial cancer 11/07/2021   Vitamin D deficiency 01/06/2021   Claudication in peripheral vascular disease (HCC) 09/16/2020   Chronic systolic CHF  (congestive heart failure), NYHA class 3 (HCC) 07/24/2018   Acquired hypothyroidism 07/24/2018   Cardiomyopathy, ischemic 07/24/2018   MGUS (monoclonal gammopathy of unknown significance) 03/06/2018   Dyspnea 01/14/2018   Allergic rhinitis 06/11/2014   Asthma due to seasonal allergies 10/24/2012   OSA (obstructive sleep apnea) 05/23/2011   Poorly controlled type 2 diabetes mellitus with circulatory disorder (HCC) 03/26/2007   Hyperlipidemia associated with type 2 diabetes mellitus (HCC) 03/26/2007   Obesity, Class III, BMI 40-49.9 (morbid obesity) 03/26/2007   Hypertension associated with type 2 diabetes mellitus (HCC) 03/26/2007    PCP: Annella Kief, NP  REFERRING PROVIDER: Annella Kief, NP  REFERRING DIAG: M75.41 (ICD-10-CM) - Internal impingement of right shoulder  THERAPY DIAG:  Right shoulder pain, unspecified chronicity  Muscle weakness (generalized)  Other abnormalities of gait and mobility  Other symptoms and signs involving the musculoskeletal system  Difficulty walking  Rationale for Evaluation and Treatment: Rehabilitation  ONSET DATE: 2-3 months  SUBJECTIVE:                                                                                                                                                                                      SUBJECTIVE STATEMENT: Patient states was able to work through [facility] and exercise at home. Still some ache depending on seat she was sitting in. Ache and ant symptoms still present, symptoms are better however.   Did PT a year ago for her knee and did great. Had a few months where she couldn't do the pool. Spend some time with health issues. Back in pool but some upper back symptoms. RUE tingling/numbness. Wants to build upper body strength. Symptoms began about 2-3 months ago with walking in pool.  Hand dominance: Right  PERTINENT HISTORY: CHF, DM neuropathy (sock distribution), CAD, previous uterine cancer  PAIN:  Are you  having pain? Yes: NPRS scale: 2/10 Pain location: R periscap Pain description: ache with RUE numb/tingling Aggravating factors: aquatics Relieving factors: meds, heat  PRECAUTIONS: Other: Adhesive Allergy*  RED FLAGS: None   WEIGHT BEARING RESTRICTIONS: No  FALLS:  Has patient fallen in last 6 months? No  OCCUPATION: previously [employer], EKG   PLOF: Independent  PATIENT GOALS:pain management/overcome pain   OBJECTIVE: (objective measures from initial evaluation unless otherwise dated)  PATIENT SURVEYS: UEFS  Extreme difficulty/unable (0), Quite a bit of difficulty (1), Moderate difficulty (2), Little difficulty (3), No difficulty (4) Survey date:  01/23/24  Any of your usual work, household or school activities 2  2. Your usual hobbies, recreational/sport activities 1   3. Lifting a bag of groceries to waist level 3   4. Lifting a bag of groceries above your head 1  5. Grooming your hair 3  6. Pushing up on your hands (I.e. from bathtub or chair) 2  7. Preparing food (I.e. peeling/cutting) 1  8. Driving  3  9. Vacuuming, sweeping, or raking 1  10. Dressing  3  11. Doing up buttons 3  12. Using tools/appliances 2  13. Opening doors 3  14. Cleaning  3  15. Tying or lacing shoes 2  16. Sleeping  1  17. Laundering clothes (I.e. washing, ironing, folding) 2  18. Opening a jar 2  19. Throwing a ball 2  20. Carrying a small suitcase with your affected limb.  2  Score total:  42/80     COGNITION: Overall cognitive status: Within functional limits for tasks assessed     SENSATION: Impaired RUE in wrist extensor region and lateral 3 fingers posteriorly   POSTURE: rounded shoulders, forward head, increased thoracic kyphosis, anterior pelvic tilt, and flexed trunk    UPPER EXTREMITY ROM: WFL bilateral UE  Cervical AROM:    01/23/2024      Flexion  0% limited * (worst)     Extension  25% limited *     R ROT 0% limited     L ROT  0% limited     R SB   50%  limited*    L SB 25% limited     * symptoms (RUE radicular symptoms) (Blank rows = not tested)    UPPER EXTREMITY MMT:  MMT Right eval Left eval  Shoulder flexion 4+ 4+  Shoulder extension    Shoulder abduction 4+ 4+  Shoulder adduction    Shoulder internal rotation 5 5  Shoulder external rotation 4+ 4+  Middle trapezius    Lower trapezius    Elbow flexion 4+ 5  Elbow extension 5 5  Wrist flexion 5 5  Wrist extension 5 5  Wrist ulnar deviation    Wrist radial deviation    Wrist pronation    Wrist supination    Grip strength (lbs) 42# 22#  (Blank rows = not tested) *=pain/symptoms     PALPATION:  TTP bilateral periscap musculature with R radicular symptoms with palpation of R levator scap, rhomboids, middle trap, UT   TODAY'S TREATMENT:  DATE:  02/06/24 Manual: Inferior glades first rib on R; STM to UT, levator scapulae, infraspinatus  Seated cervical retractions 2 x 10 Seated bilateral ER with scap retraction RTB 3 x 5 Standing shoulder extension RTB 3 x 10 Seated thoracic extension over chair 5 x 5 second holds  01/30/24 Manual: Inferior glades first rib on R; STM to UT, levator scapulae, infraspinatus  Standing row RTB 2 x 10 Seated cervical retractions 2 x 10  01/23/24 Eval, education, HEP  PATIENT EDUCATION:  Education details: Patient educated on exam findings, POC, scope of PT, HEP, relevant anatomy and biomechanics, and self STM with tennis ball. Person educated: Patient Education method: Explanation, Demonstration, and Handouts Education comprehension: verbalized understanding, returned demonstration, verbal cues required, and tactile cues required  HOME EXERCISE PROGRAM: Access Code: [email or web address] URL: [email or web address] Date: 01/23/2024 Prepared by: Debria Fang Candela Krul  Exercises - Seated Scapular  Retraction  - 2 x daily - 7 x weekly - 2 sets - 10 reps  ASSESSMENT:  CLINICAL IMPRESSION: Patient with continued radicular symptoms which have improved with/following last session. Continued with manual to R proximal UE with reproduction of symptoms. Symptoms reduce with manual and somewhat increase upon return to standing/ambulating, intensify with cueing for scap retraction vs slightly flexed posture. Symptoms return to baseline mostly following provocative factors today. Patient will continue to benefit from physical therapy in order to improve function and reduce impairment.   OBJECTIVE IMPAIRMENTS: decreased activity tolerance, decreased endurance, decreased mobility, decreased ROM, decreased strength, increased muscle spasms, impaired flexibility, improper body mechanics, postural dysfunction, and pain.   ACTIVITY LIMITATIONS: carrying, lifting, bending, stairs, transfers, bathing, reach over head, hygiene/grooming, and caring for others  PARTICIPATION LIMITATIONS: meal prep, cleaning, laundry, shopping, community activity, and yard work  PERSONAL FACTORS: Fitness, Time since onset of injury/illness/exacerbation, and 3+ comorbidities: CHF, DM neuropathy (sock distribution), CAD, previous uterine cancer are also affecting patient's functional outcome.   REHAB POTENTIAL: Good  CLINICAL DECISION MAKING: Evolving/moderate complexity  EVALUATION COMPLEXITY: Moderate   GOALS: Goals reviewed with patient? Yes  SHORT TERM GOALS: Target date: 02/20/2024    Patient will be independent with HEP in order to improve functional outcomes. Baseline: Goal status: INITIAL  2.  Patient will report at least 25% improvement in symptoms for improved quality of life. Baseline:  Goal status: INITIAL    LONG TERM GOALS: Target date: 03/19/2024    Patient will report at least 75% improvement in symptoms for improved quality of life. Baseline:  Goal status: INITIAL  2.  Patient will improve  UEFS score by at least 9 points in order to indicate improved tolerance to activity. Baseline:  Goal status: INITIAL  3.  Patient will have reduction in tissue tension in R periscap region for reduction in symptoms. Baseline:  Goal status: INITIAL  4.  Patient will be able to return to all activities unrestricted for improved ability to exercise. Baseline:  Goal status: INITIAL  5.  Patient will demonstrate grade of 5/5 MMT grade in all tested musculature as evidence of improved strength to assist with lifting at home. Baseline:  Goal status: INITIAL   PLAN:  PT FREQUENCY: 1-2x/week  PT DURATION: 8 weeks  PLANNED INTERVENTIONS: 97164- PT Re-evaluation, 97110-Therapeutic exercises, 97530- Therapeutic activity, 97112- Neuromuscular re-education, 97535- Self Care, [id number]- Manual therapy, [phone]- Gait training, [phone]- Orthotic Fit/training, [phone]- Canalith repositioning, [id number]- Aquatic Therapy, [phone]- Splinting, Patient/Family education, Balance training, Stair training, Taping, Dry Needling, Joint mobilization, Joint manipulation, Spinal manipulation, Spinal mobilization, Scar mobilization, and DME instructions.  PLAN FOR NEXT SESSION: possibly manual to periscap, postural and UE strengthening   Beather Liming, PT, DPT 02/06/2024, 3:55 PM   Date of referral: 12/17/23 Referring provider: Annella Kief, NP Referring diagnosis? M75.41 (ICD-10-CM) - Internal impingement of right shoulder Treatment diagnosis? (if different than referring diagnosis) M25.511 Pain in R shoulder  What was this (referring dx) caused by? Ongoing Issue  Lonne Roan of Condition: Initial Onset (within last 3 months)   Laterality: Rt  Current Functional Measure Score: Other UEFS:  42/80   Objective measurements identify impairments when they are compared to normal values, the uninvolved extremity, and prior level of function.  [x]  Yes  []  No  Objective assessment of functional ability: Moderate functional  limitations   Briefly describe symptoms: pain in R shoulder blade/neck with R radicular symptoms  How did symptoms start: aquatic exercise/insidious onset  Average pain intensity:  Last 24 hours: 3-4/10  Past week: 3-4/10  How often does the pt experience symptoms? Constantly  How much have the symptoms interfered with usual daily activities? Quite a bit  How has condition changed since care began at this facility? NA - initial visit  In general, how is the patients overall health? Good   BACK PAIN (STarT Back Screening Tool) No

## 2024-02-14 ENCOUNTER — Encounter (HOSPITAL_BASED_OUTPATIENT_CLINIC_OR_DEPARTMENT_OTHER): Payer: Self-pay | Admitting: Physical Therapy

## 2024-02-14 ENCOUNTER — Telehealth: Payer: Self-pay

## 2024-02-14 ENCOUNTER — Telehealth: Payer: Self-pay | Admitting: Nurse Practitioner

## 2024-02-14 ENCOUNTER — Ambulatory Visit (HOSPITAL_BASED_OUTPATIENT_CLINIC_OR_DEPARTMENT_OTHER): Payer: Self-pay | Admitting: Physical Therapy

## 2024-02-14 DIAGNOSIS — M6281 Muscle weakness (generalized): Secondary | ICD-10-CM | POA: Diagnosis not present

## 2024-02-14 DIAGNOSIS — M25511 Pain in right shoulder: Secondary | ICD-10-CM

## 2024-02-14 DIAGNOSIS — R2689 Other abnormalities of gait and mobility: Secondary | ICD-10-CM | POA: Diagnosis not present

## 2024-02-14 DIAGNOSIS — R29898 Other symptoms and signs involving the musculoskeletal system: Secondary | ICD-10-CM

## 2024-02-14 DIAGNOSIS — R262 Difficulty in walking, not elsewhere classified: Secondary | ICD-10-CM | POA: Diagnosis not present

## 2024-02-14 NOTE — Telephone Encounter (Signed)
 PAP LANTUS  received, left message for pt

## 2024-02-14 NOTE — Telephone Encounter (Signed)
 The patient returned the call stating she will pick up the PAP Lantus  on 6/30 when she has an appt. She just wanted to let her provider know and to also say thank you.

## 2024-02-14 NOTE — Progress Notes (Signed)
   02/14/2024  Patient ID: Terri Heath, female   DOB: 1956/02/27, 68 y.o.   MRN: [id number]  Received notification that patient has been approved to receive Lantus  through [employer] assistance program through 08/27/24. First shipment pending.  Carnell Christian, PharmD Clinical Pharmacist [phone]

## 2024-02-14 NOTE — Therapy (Signed)
 OUTPATIENT PHYSICAL THERAPY SHOULDER EVALUATION   Patient Name: Terri Heath MRN: [id number] DOB:09/03/1955, 68 y.o., female Today's Date: 02/14/2024  END OF SESSION:  PT End of Session - 02/14/24 1348     Visit Number 4    Number of Visits 16    Date for PT Re-Evaluation 03/19/24    Authorization Type [employer]    Progress Note Due on Visit 10    PT Start Time 1348    PT Stop Time 1428    PT Time Calculation (min) 40 min    Activity Tolerance Patient tolerated treatment well    Behavior During Therapy WFL for tasks assessed/performed          Past Medical History:  Diagnosis Date   Abnormal vaginal bleeding in postmenopausal patient 10/11/2021   Acute systolic heart failure (HCC)    Allergy    Asthma    Bilateral shoulder pain    Borderline hypertension    Bronchitis    CAD (coronary artery disease)    Chronic systolic CHF (congestive heart failure) (HCC)    Chronic systolic congestive heart failure, NYHA class 3 (HCC) 07/24/2018   Closed fracture of distal fibula 07/17/2022   Dehydration 12/20/2021   Diabetes mellitus    Diagnosed in 2000, on insulin , Trulicity  and metformin , not checking cgs at home   Drug-induced hypotension 12/20/2021   Elevated serum creatinine 12/20/2021   Encounter to establish care 01/06/2021   Gangrenous appendicitis with perforation s/p lap appendectomy 10/04/2017 10/04/2017   GERD (gastroesophageal reflux disease)    History of MI (myocardial infarction)    History of radiation therapy    Uterus- 12/14/21-02/01/22- Dr. Retta Caster   Hyperlipidemia    Hypertension    Hypothyroidism    MGUS (monoclonal gammopathy of unknown significance)    Mitral regurgitation    Myocardial infarction (HCC)    Neuropathy    Obesity    Rash of back 01/06/2021   Sleep apnea    uses CPAP   Statin intolerance    Uterine cancer [facility])    Past Surgical History:  Procedure Laterality Date   BREATH TEK H PYLORI  09/18/2011    Procedure: BREATH TEK H PYLORI;  Surgeon: Thayne Fine, MD;  Location: [facility];  Service: General;  Laterality: N/A;   CESAREAN SECTION     x 3   IR IMAGING GUIDED PORT INSERTION  12/07/2021   IR REMOVAL TUN ACCESS W/ PORT W/O FL MOD SED  04/04/2022   LAPAROSCOPIC APPENDECTOMY N/A 10/03/2017   Procedure: APPENDECTOMY LAPAROSCOPIC;  Surgeon: Joyce Nixon, MD;  Location: [facility];  Service: General;  Laterality: N/A;   OPERATIVE ULTRASOUND N/A 02/01/2022   Procedure: OPERATIVE ULTRASOUND;  Surgeon: Retta Caster, MD;  Location: [facility];  Service: Urology;  Laterality: N/A;   RIGHT/LEFT HEART CATH AND CORONARY ANGIOGRAPHY N/A 02/12/2018   Procedure: RIGHT/LEFT HEART CATH AND CORONARY ANGIOGRAPHY;  Surgeon: Lucendia Rusk, MD;  Location: [facility]IVE CV LAB;  Service: Cardiovascular;  Laterality: N/A;   ROBOTIC ASSISTED TOTAL HYSTERECTOMY WITH BILATERAL SALPINGO OOPHERECTOMY Bilateral 03/21/2022   Procedure: XI ROBOTIC ASSISTED TOTAL HYSTERECTOMY WITH BILATERAL SALPINGO OOPHORECTOMY, LYSIS OF ADHESIONS, CYSTOSCOPY;  Surgeon: Suzi Essex, MD;  Location: [facility];  Service: Gynecology;  Laterality: Bilateral;   TANDEM RING INSERTION N/A 02/01/2022   Procedure: TANDEM RING INSERTION;  Surgeon: Retta Caster, MD;  Location: [facility];  Service: Urology;  Laterality: N/A;   TUBAL LIGATION     Patient Active Problem  List   Diagnosis Date Noted   Internal impingement of right shoulder 12/17/2023   Sciatic nerve pain 08/28/2023   Cellulitis of perineum 06/11/2023   Hyperglycemia due to diabetes mellitus (HCC) 06/11/2023   Labial abscess 06/07/2023   Statin myopathy 05/03/2023   Hypomagnesemia 01/17/2022   Pancytopenia, acquired (HCC) 01/03/2022   Diabetic peripheral neuropathy associated with type 2 diabetes mellitus (HCC) 12/02/2021   History of endometrial cancer 11/07/2021   Vitamin D deficiency 01/06/2021   Claudication in peripheral vascular disease (HCC) 09/16/2020   Chronic systolic CHF  (congestive heart failure), NYHA class 3 (HCC) 07/24/2018   Acquired hypothyroidism 07/24/2018   Cardiomyopathy, ischemic 07/24/2018   MGUS (monoclonal gammopathy of unknown significance) 03/06/2018   Dyspnea 01/14/2018   Allergic rhinitis 06/11/2014   Asthma due to seasonal allergies 10/24/2012   OSA (obstructive sleep apnea) 05/23/2011   Poorly controlled type 2 diabetes mellitus with circulatory disorder (HCC) 03/26/2007   Hyperlipidemia associated with type 2 diabetes mellitus (HCC) 03/26/2007   Obesity, Class III, BMI 40-49.9 (morbid obesity) 03/26/2007   Hypertension associated with type 2 diabetes mellitus (HCC) 03/26/2007    PCP: Annella Kief, NP  REFERRING PROVIDER: Annella Kief, NP  REFERRING DIAG: M75.41 (ICD-10-CM) - Internal impingement of right shoulder  THERAPY DIAG:  Right shoulder pain, unspecified chronicity  Muscle weakness (generalized)  Other abnormalities of gait and mobility  Other symptoms and signs involving the musculoskeletal system  Rationale for Evaluation and Treatment: Rehabilitation  ONSET DATE: 2-3 months  SUBJECTIVE:                                                                                                                                                                                      SUBJECTIVE STATEMENT: Patient states arm aching and tingling. Seems to be better as she didn't take any pain meds today.  Massage to back at home. Been doing HEP. Thinks its coming from neck/shoulder area.   EVAL: Did PT a year ago for her knee and did great. Had a few months where she couldn't do the pool. Spend some time with health issues. Back in pool but some upper back symptoms. RUE tingling/numbness. Wants to build upper body strength. Symptoms began about 2-3 months ago with walking in pool.  Hand dominance: Right  PERTINENT HISTORY: CHF, DM neuropathy (sock distribution), CAD, previous uterine cancer  PAIN:  Are you having pain? Yes:  NPRS scale: 2/10 Pain location: R periscap Pain description: ache with RUE numb/tingling Aggravating factors: aquatics Relieving factors: meds, heat  PRECAUTIONS: Other: Adhesive Allergy*  RED FLAGS: None   WEIGHT BEARING RESTRICTIONS: No  FALLS:  Has patient fallen in  last 6 months? No  OCCUPATION: previously [employer], EKG   PLOF: Independent  PATIENT GOALS:pain management/overcome pain   OBJECTIVE: (objective measures from initial evaluation unless otherwise dated)  PATIENT SURVEYS: UEFS  Extreme difficulty/unable (0), Quite a bit of difficulty (1), Moderate difficulty (2), Little difficulty (3), No difficulty (4) Survey date:  01/23/24  Any of your usual work, household or school activities 2  2. Your usual hobbies, recreational/sport activities 1   3. Lifting a bag of groceries to waist level 3   4. Lifting a bag of groceries above your head 1  5. Grooming your hair 3  6. Pushing up on your hands (I.e. from bathtub or chair) 2  7. Preparing food (I.e. peeling/cutting) 1  8. Driving  3  9. Vacuuming, sweeping, or raking 1  10. Dressing  3  11. Doing up buttons 3  12. Using tools/appliances 2  13. Opening doors 3  14. Cleaning  3  15. Tying or lacing shoes 2  16. Sleeping  1  17. Laundering clothes (I.e. washing, ironing, folding) 2  18. Opening a jar 2  19. Throwing a ball 2  20. Carrying a small suitcase with your affected limb.  2  Score total:  42/80     COGNITION: Overall cognitive status: Within functional limits for tasks assessed     SENSATION: Impaired RUE in wrist extensor region and lateral 3 fingers posteriorly   POSTURE: rounded shoulders, forward head, increased thoracic kyphosis, anterior pelvic tilt, and flexed trunk    UPPER EXTREMITY ROM: WFL bilateral UE  Cervical AROM:    01/23/2024      Flexion  0% limited * (worst)     Extension  25% limited *     R ROT 0% limited     L ROT  0% limited     R SB   50% limited*    L SB 25%  limited     * symptoms (RUE radicular symptoms) (Blank rows = not tested)    UPPER EXTREMITY MMT:  MMT Right eval Left eval  Shoulder flexion 4+ 4+  Shoulder extension    Shoulder abduction 4+ 4+  Shoulder adduction    Shoulder internal rotation 5 5  Shoulder external rotation 4+ 4+  Middle trapezius    Lower trapezius    Elbow flexion 4+ 5  Elbow extension 5 5  Wrist flexion 5 5  Wrist extension 5 5  Wrist ulnar deviation    Wrist radial deviation    Wrist pronation    Wrist supination    Grip strength (lbs) 42# 22#  (Blank rows = not tested) *=pain/symptoms     PALPATION:  TTP bilateral periscap musculature with R radicular symptoms with palpation of R levator scap, rhomboids, middle trap, UT   TODAY'S TREATMENT:  DATE:  02/14/24 Manual: Inferior glades first rib on R; STM to UT, levator scapulae, infraspinatus  Seated UT scalenes stretch with manual assist Radial nerve glides 2 x 10 Seated scalenes stretch 3 x 20 second holds Standing shoulder horizontal abduction RTB 2 x 10 Education on cane use and UE mechanics  02/06/24 Manual: Inferior glades first rib on R; STM to UT, levator scapulae, infraspinatus  Seated cervical retractions 2 x 10 Seated bilateral ER with scap retraction RTB 3 x 5 Standing shoulder extension RTB 3 x 10 Seated thoracic extension over chair 5 x 5 second holds  01/30/24 Manual: Inferior glades first rib on R; STM to UT, levator scapulae, infraspinatus  Standing row RTB 2 x 10 Seated cervical retractions 2 x 10  01/23/24 Eval, education, HEP  PATIENT EDUCATION:  Education details: Patient educated on exam findings, POC, scope of PT, HEP, relevant anatomy and biomechanics, and self STM with tennis ball. Person educated: Patient Education method: Explanation, Demonstration, and Handouts Education  comprehension: verbalized understanding, returned demonstration, verbal cues required, and tactile cues required  HOME EXERCISE PROGRAM: Access Code: [email or web address] URL: [email or web address] Date: 01/23/2024 Prepared by: Debria Fang Santiaga Butzin  Exercises - Seated Scapular Retraction  - 2 x daily - 7 x weekly - 2 sets - 10 reps  ASSESSMENT:  CLINICAL IMPRESSION: Patient appears to be having improving symptoms overall. Manual to hyperactive and hypomobile areas. Radial nerve tension illicit symptoms. Began nerve glides. Continued with postural strengthening and UE mobility.  Patient will continue to benefit from physical therapy in order to improve function and reduce impairment.   OBJECTIVE IMPAIRMENTS: decreased activity tolerance, decreased endurance, decreased mobility, decreased ROM, decreased strength, increased muscle spasms, impaired flexibility, improper body mechanics, postural dysfunction, and pain.   ACTIVITY LIMITATIONS: carrying, lifting, bending, stairs, transfers, bathing, reach over head, hygiene/grooming, and caring for others  PARTICIPATION LIMITATIONS: meal prep, cleaning, laundry, shopping, community activity, and yard work  PERSONAL FACTORS: Fitness, Time since onset of injury/illness/exacerbation, and 3+ comorbidities: CHF, DM neuropathy (sock distribution), CAD, previous uterine cancer are also affecting patient's functional outcome.   REHAB POTENTIAL: Good  CLINICAL DECISION MAKING: Evolving/moderate complexity  EVALUATION COMPLEXITY: Moderate   GOALS: Goals reviewed with patient? Yes  SHORT TERM GOALS: Target date: 02/20/2024    Patient will be independent with HEP in order to improve functional outcomes. Baseline: Goal status: INITIAL  2.  Patient will report at least 25% improvement in symptoms for improved quality of life. Baseline:  Goal status: INITIAL    LONG TERM GOALS: Target date: 03/19/2024    Patient will report at least 75%  improvement in symptoms for improved quality of life. Baseline:  Goal status: INITIAL  2.  Patient will improve UEFS score by at least 9 points in order to indicate improved tolerance to activity. Baseline:  Goal status: INITIAL  3.  Patient will have reduction in tissue tension in R periscap region for reduction in symptoms. Baseline:  Goal status: INITIAL  4.  Patient will be able to return to all activities unrestricted for improved ability to exercise. Baseline:  Goal status: INITIAL  5.  Patient will demonstrate grade of 5/5 MMT grade in all tested musculature as evidence of improved strength to assist with lifting at home. Baseline:  Goal status: INITIAL   PLAN:  PT FREQUENCY: 1-2x/week  PT DURATION: 8 weeks  PLANNED INTERVENTIONS: 97164- PT Re-evaluation, 97110-Therapeutic exercises, 97530- Therapeutic activity, [id number]- Neuromuscular re-education, 97535- Self Care, 21308- Manual therapy, [id number]- Gait training, [phone]- Orthotic Fit/training,  59563- Canalith repositioning, 87564- Aquatic Therapy, [id number]- Splinting, Patient/Family education, Balance training, Stair training, Taping, Dry Needling, Joint mobilization, Joint manipulation, Spinal manipulation, Spinal mobilization, Scar mobilization, and DME instructions.   PLAN FOR NEXT SESSION: possibly manual to periscap, postural and UE strengthening   Beather Liming, PT, DPT 02/14/2024, 1:49 PM   Date of referral: 12/17/23 Referring provider: Annella Kief, NP Referring diagnosis? M75.41 (ICD-10-CM) - Internal impingement of right shoulder Treatment diagnosis? (if different than referring diagnosis) M25.511 Pain in R shoulder  What was this (referring dx) caused by? Ongoing Issue  Lonne Roan of Condition: Initial Onset (within last 3 months)   Laterality: Rt  Current Functional Measure Score: Other UEFS:  42/80   Objective measurements identify impairments when they are compared to normal values, the uninvolved  extremity, and prior level of function.  [x]  Yes  []  No  Objective assessment of functional ability: Moderate functional limitations   Briefly describe symptoms: pain in R shoulder blade/neck with R radicular symptoms  How did symptoms start: aquatic exercise/insidious onset  Average pain intensity:  Last 24 hours: 3-4/10  Past week: 3-4/10  How often does the pt experience symptoms? Constantly  How much have the symptoms interfered with usual daily activities? Quite a bit  How has condition changed since care began at this facility? NA - initial visit  In general, how is the patients overall health? Good   BACK PAIN (STarT Back Screening Tool) No

## 2024-02-22 ENCOUNTER — Encounter (HOSPITAL_BASED_OUTPATIENT_CLINIC_OR_DEPARTMENT_OTHER): Payer: Self-pay | Admitting: Physical Therapy

## 2024-02-25 ENCOUNTER — Ambulatory Visit (INDEPENDENT_AMBULATORY_CARE_PROVIDER_SITE_OTHER): Admitting: Nurse Practitioner

## 2024-02-25 ENCOUNTER — Encounter: Payer: Self-pay | Admitting: Nurse Practitioner

## 2024-02-25 ENCOUNTER — Encounter (HOSPITAL_BASED_OUTPATIENT_CLINIC_OR_DEPARTMENT_OTHER): Admitting: Physical Therapy

## 2024-02-25 VITALS — BP 106/62 | HR 90 | Ht 67.0 in | Wt 283.6 lb

## 2024-02-25 DIAGNOSIS — E1159 Type 2 diabetes mellitus with other circulatory complications: Secondary | ICD-10-CM | POA: Diagnosis not present

## 2024-02-25 DIAGNOSIS — H6191 Disorder of right external ear, unspecified: Secondary | ICD-10-CM

## 2024-02-25 DIAGNOSIS — E1165 Type 2 diabetes mellitus with hyperglycemia: Secondary | ICD-10-CM | POA: Diagnosis not present

## 2024-02-25 DIAGNOSIS — M25511 Pain in right shoulder: Secondary | ICD-10-CM | POA: Diagnosis not present

## 2024-02-25 HISTORY — DX: Disorder of right external ear, unspecified: H61.91

## 2024-02-25 NOTE — Assessment & Plan Note (Signed)
 Recurrent lesion on the right ear, previously biopsied as non-cancerous, treated with cryotherapy, now presenting initially with black discoloration and pain, affecting sleep. The coloration has returned more to normal, but there is scaling and a central dark area within the lesion. The lesion's location on a sun-exposed area raises concern for skin cancer. Krill oil use has improved color but not pain. - Refer to dermatologist at [facility] for evaluation and possible biopsy. - Consider cryotherapy if deemed appropriate by the dermatologist.

## 2024-02-25 NOTE — Patient Instructions (Signed)
 Keep up with the stretches and try the lidocaine  patches to see if this helps with the shoulder pain.   I have sent the referral for the dermatologist for you. I added the pictures to your chart.

## 2024-02-25 NOTE — Assessment & Plan Note (Signed)
 Type 2 Diabetes Mellitus, well-managed with metformin . Inquiry about berberine as a supplement or replacement raises concerns about potential interactions, as berberine may lower metformin 's effectiveness and increase side effects. Metformin  effectively controls blood glucose levels. - Research potential interactions between berberine and metformin . - Continue metformin  for blood glucose control. - Provide information on berberine's efficacy and safety once available.

## 2024-02-25 NOTE — Progress Notes (Signed)
 Camie FORBES Doing, DNP, AGNP-c [facility] [address] [place], [place] [address] [phone]   ACUTE VISIT- ESTABLISHED PATIENT  Blood pressure 106/62, pulse 90, height 5' 7 (1.702 m), weight 283 lb 9.6 oz (128.6 kg), SpO2 98%.  Subjective:  HPI History of Present Illness Terri Heath is a 68 year old female who presents with a recurring lesion on her right ear.  She has a recurring lesion on her right ear, previously biopsied and found to be non-cancerous. The lesion was treated with cryotherapy and resolved for a long period before reappearing. Recently, it turned black and scaly, causing concern. She has been using krill oil, which has improved the color, but she remains concerned with its appearance. The lesion is painful, making it difficult to sleep on that side. She has previously seen [facility] dermatology and would like a referral to them today for evaluation and recommendations.   She experiences pain and tingling in her right arm, described as a 'headache in my arm' with 'little ant tangles.' This sensation is constant and alleviated with heat. She wonders if using a walker or cane incorrectly may be contributing to her symptoms. Physical therapy has been ongoing since Jan 25, 2024, with exercises involving moderate pulling and stretching in an effort to try to alleviate the pain. Her PT told her this was stemming from a group of nerves under the clavicle. The pain has impacted her ability to swim, an activity she previously enjoyed. She feels that the trigger for the symptoms is the use of her walker and/or cane incorrectly. She is hoping that she will be able to have relief to get back into the pool for exercise soon.   She is taking several supplements, including krill oil twice a day, B12, vitamin D, and magnesium . She has been on metformin  for 25 years. She plans to use a lidocaine  patch for pain relief of the left arm.     ROS negative except for  what is listed in HPI. History, Medications, Surgery, SDOH, and Family History reviewed and updated as appropriate.  Objective:  Physical Exam Vitals and nursing note reviewed.  Constitutional:      Appearance: Normal appearance. She is obese.  HENT:     Head:    Eyes:     Conjunctiva/sclera: Conjunctivae normal.    Cardiovascular:     Rate and Rhythm: Normal rate and regular rhythm.     Pulses: Normal pulses.     Heart sounds: Normal heart sounds.  Pulmonary:     Effort: Pulmonary effort is normal.     Breath sounds: Normal breath sounds.   Musculoskeletal:        General: Tenderness present.       Arms:     Right lower leg: No edema.     Left lower leg: No edema.     Comments: Right shoulder pain and paresthesias from the shoulder down the right arm. Strength intact.    Skin:    General: Skin is warm and dry.   Neurological:     General: No focal deficit present.     Mental Status: She is alert and oriented to person, place, and time.     Gait: Gait abnormal.   Psychiatric:        Mood and Affect: Mood normal.    Right arm paresthesias.     Assessment & Plan:   Problem List Items Addressed This Visit     Poorly controlled type 2 diabetes  mellitus with circulatory disorder (HCC)   Type 2 Diabetes Mellitus, well-managed with metformin . Inquiry about berberine as a supplement or replacement raises concerns about potential interactions, as berberine may lower metformin 's effectiveness and increase side effects. Metformin  effectively controls blood glucose levels. - Research potential interactions between berberine and metformin . - Continue metformin  for blood glucose control. - Provide information on berberine's efficacy and safety once available.      Earlobe lesion, right - Primary   Recurrent lesion on the right ear, previously biopsied as non-cancerous, treated with cryotherapy, now presenting initially with black discoloration and pain, affecting sleep. The  coloration has returned more to normal, but there is scaling and a central dark area within the lesion. The lesion's location on a sun-exposed area raises concern for skin cancer. Krill oil use has improved color but not pain. - Refer to dermatologist at [facility] for evaluation and possible biopsy. - Consider cryotherapy if deemed appropriate by the dermatologist.      Relevant Orders   Ambulatory referral to Dermatology   Subscapular pain, right   Nerve impingement likely involving the infrascapular nerve, causing persistent arm aching and tingling, exacerbated by cane use and alleviated with heat. Physical therapy focuses on posture improvement and reducing cane reliance. Improper cane use may contribute to symptoms. - Continue physical therapy sessions. - Consider cane training to improve posture and reduce reliance. - Use heat therapy for symptom relief. - Consider lidocaine  patches for pain relief, available over-the-counter or through insurance.      Relevant Orders   Ambulatory referral to Dermatology      Camie FORBES Doing, DNP, AGNP-c

## 2024-02-25 NOTE — Assessment & Plan Note (Signed)
 Nerve impingement likely involving the infrascapular nerve, causing persistent arm aching and tingling, exacerbated by cane use and alleviated with heat. Physical therapy focuses on posture improvement and reducing cane reliance. Improper cane use may contribute to symptoms. - Continue physical therapy sessions. - Consider cane training to improve posture and reduce reliance. - Use heat therapy for symptom relief. - Consider lidocaine  patches for pain relief, available over-the-counter or through insurance.

## 2024-02-27 ENCOUNTER — Encounter (HOSPITAL_BASED_OUTPATIENT_CLINIC_OR_DEPARTMENT_OTHER): Payer: Self-pay | Admitting: Physical Therapy

## 2024-02-27 ENCOUNTER — Ambulatory Visit (HOSPITAL_BASED_OUTPATIENT_CLINIC_OR_DEPARTMENT_OTHER): Payer: Self-pay | Attending: Nurse Practitioner | Admitting: Physical Therapy

## 2024-02-27 DIAGNOSIS — R2689 Other abnormalities of gait and mobility: Secondary | ICD-10-CM | POA: Diagnosis not present

## 2024-02-27 DIAGNOSIS — R29898 Other symptoms and signs involving the musculoskeletal system: Secondary | ICD-10-CM | POA: Diagnosis not present

## 2024-02-27 DIAGNOSIS — M25511 Pain in right shoulder: Secondary | ICD-10-CM | POA: Diagnosis not present

## 2024-02-27 DIAGNOSIS — M6281 Muscle weakness (generalized): Secondary | ICD-10-CM | POA: Insufficient documentation

## 2024-02-27 NOTE — Therapy (Signed)
 OUTPATIENT PHYSICAL THERAPY SHOULDER EVALUATION   Patient Name: Terri Heath MRN: [id number] DOB:05-20-1956, 68 y.o., female Today's Date: 02/27/2024  END OF SESSION:  PT End of Session - 02/27/24 1016     Visit Number 5    Number of Visits 16    Date for PT Re-Evaluation 03/19/24    Authorization Type [employer]    Progress Note Due on Visit 10    PT Start Time 1015    PT Stop Time 1055    PT Time Calculation (min) 40 min    Activity Tolerance Patient tolerated treatment well    Behavior During Therapy WFL for tasks assessed/performed          Past Medical History:  Diagnosis Date   Abnormal vaginal bleeding in postmenopausal patient 10/11/2021   Acute systolic heart failure (HCC)    Allergy    Asthma    Bilateral shoulder pain    Borderline hypertension    Bronchitis    CAD (coronary artery disease)    Chronic systolic CHF (congestive heart failure) (HCC)    Chronic systolic congestive heart failure, NYHA class 3 (HCC) 07/24/2018   Closed fracture of distal fibula 07/17/2022   Dehydration 12/20/2021   Diabetes mellitus    Diagnosed in 2000, on insulin , Trulicity  and metformin , not checking cgs at home   Drug-induced hypotension 12/20/2021   Elevated serum creatinine 12/20/2021   Encounter to establish care 01/06/2021   Gangrenous appendicitis with perforation s/p lap appendectomy 10/04/2017 10/04/2017   GERD (gastroesophageal reflux disease)    History of MI (myocardial infarction)    History of radiation therapy    Uterus- 12/14/21-02/01/22- Dr. Lynwood Nasuti   Hyperlipidemia    Hypertension    Hypothyroidism    MGUS (monoclonal gammopathy of unknown significance)    Mitral regurgitation    Myocardial infarction (HCC)    Neuropathy    Obesity    Rash of back 01/06/2021   Sleep apnea    uses CPAP   Statin intolerance    Uterine cancer [facility])    Past Surgical History:  Procedure Laterality Date   BREATH TEK H PYLORI  09/18/2011    Procedure: BREATH TEK H PYLORI;  Surgeon: Alm VEAR Angle, MD;  Location: [facility];  Service: General;  Laterality: N/A;   CESAREAN SECTION     x 3   IR IMAGING GUIDED PORT INSERTION  12/07/2021   IR REMOVAL TUN ACCESS W/ PORT W/O FL MOD SED  04/04/2022   LAPAROSCOPIC APPENDECTOMY N/A 10/03/2017   Procedure: APPENDECTOMY LAPAROSCOPIC;  Surgeon: Debby Hila, MD;  Location: [facility];  Service: General;  Laterality: N/A;   OPERATIVE ULTRASOUND N/A 02/01/2022   Procedure: OPERATIVE ULTRASOUND;  Surgeon: Nasuti Lynwood, MD;  Location: [facility];  Service: Urology;  Laterality: N/A;   RIGHT/LEFT HEART CATH AND CORONARY ANGIOGRAPHY N/A 02/12/2018   Procedure: RIGHT/LEFT HEART CATH AND CORONARY ANGIOGRAPHY;  Surgeon: Dann Candyce RAMAN, MD;  Location: [facility] INVASIVE CV LAB;  Service: Cardiovascular;  Laterality: N/A;   ROBOTIC ASSISTED TOTAL HYSTERECTOMY WITH BILATERAL SALPINGO OOPHERECTOMY Bilateral 03/21/2022   Procedure: XI ROBOTIC ASSISTED TOTAL HYSTERECTOMY WITH BILATERAL SALPINGO OOPHORECTOMY, LYSIS OF ADHESIONS, CYSTOSCOPY;  Surgeon: Viktoria Comer SAUNDERS, MD;  Location: [facility];  Service: Gynecology;  Laterality: Bilateral;   TANDEM RING INSERTION N/A 02/01/2022   Procedure: TANDEM RING INSERTION;  Surgeon: Nasuti Lynwood, MD;  Location: [facility];  Service: Urology;  Laterality: N/A;   TUBAL LIGATION     Patient Active Problem  List   Diagnosis Date Noted   Earlobe lesion, right 02/25/2024   Subscapular pain, right 12/17/2023   Sciatic nerve pain 08/28/2023   Cellulitis of perineum 06/11/2023   Hyperglycemia due to diabetes mellitus (HCC) 06/11/2023   Labial abscess 06/07/2023   Statin myopathy 05/03/2023   Hypomagnesemia 01/17/2022   Pancytopenia, acquired (HCC) 01/03/2022   Diabetic peripheral neuropathy associated with type 2 diabetes mellitus (HCC) 12/02/2021   History of endometrial cancer 11/07/2021   Vitamin D deficiency 01/06/2021   Claudication in peripheral vascular disease (HCC) 09/16/2020    Chronic systolic CHF (congestive heart failure), NYHA class 3 (HCC) 07/24/2018   Acquired hypothyroidism 07/24/2018   Cardiomyopathy, ischemic 07/24/2018   MGUS (monoclonal gammopathy of unknown significance) 03/06/2018   Dyspnea 01/14/2018   Allergic rhinitis 06/11/2014   Asthma due to seasonal allergies 10/24/2012   OSA (obstructive sleep apnea) 05/23/2011   Poorly controlled type 2 diabetes mellitus with circulatory disorder (HCC) 03/26/2007   Hyperlipidemia associated with type 2 diabetes mellitus (HCC) 03/26/2007   Obesity, Class III, BMI 40-49.9 (morbid obesity) 03/26/2007   Hypertension associated with type 2 diabetes mellitus (HCC) 03/26/2007    PCP: Oris Camie BRAVO, NP  REFERRING PROVIDER: Oris Camie BRAVO, NP  REFERRING DIAG: M75.41 (ICD-10-CM) - Internal impingement of right shoulder  THERAPY DIAG:  Right shoulder pain, unspecified chronicity  Muscle weakness (generalized)  Other abnormalities of gait and mobility  Other symptoms and signs involving the musculoskeletal system  Rationale for Evaluation and Treatment: Rehabilitation  ONSET DATE: 2-3 months  SUBJECTIVE:                                                                                                                                                                                      SUBJECTIVE STATEMENT: Patient states arm aching and ant feeling in arm but doing better. Trying to do better with the cane/walking without the cane.   EVAL: Did PT a year ago for her knee and did great. Had a few months where she couldn't do the pool. Spend some time with health issues. Back in pool but some upper back symptoms. RUE tingling/numbness. Wants to build upper body strength. Symptoms began about 2-3 months ago with walking in pool.  Hand dominance: Right  PERTINENT HISTORY: CHF, DM neuropathy (sock distribution), CAD, previous uterine cancer  PAIN:  Are you having pain? Yes: NPRS scale: 1.5-2/10 Pain location: R  periscap Pain description: ache with RUE numb/tingling Aggravating factors: aquatics Relieving factors: meds, heat  PRECAUTIONS: Other: Adhesive Allergy*  RED FLAGS: None   WEIGHT BEARING RESTRICTIONS: No  FALLS:  Has patient fallen in last 6 months? No  OCCUPATION: previously  , EKG   PLOF: Independent  PATIENT GOALS:pain management/overcome pain   OBJECTIVE: (objective measures from initial evaluation unless otherwise dated)  PATIENT SURVEYS: UEFS  Extreme difficulty/unable (0), Quite a bit of difficulty (1), Moderate difficulty (2), Little difficulty (3), No difficulty (4) Survey date:  01/23/24  Any of your usual work, household or school activities 2  2. Your usual hobbies, recreational/sport activities 1   3. Lifting a bag of groceries to waist level 3   4. Lifting a bag of groceries above your head 1  5. Grooming your hair 3  6. Pushing up on your hands (I.e. from bathtub or chair) 2  7. Preparing food (I.e. peeling/cutting) 1  8. Driving  3  9. Vacuuming, sweeping, or raking 1  10. Dressing  3  11. Doing up buttons 3  12. Using tools/appliances 2  13. Opening doors 3  14. Cleaning  3  15. Tying or lacing shoes 2  16. Sleeping  1  17. Laundering clothes (I.e. washing, ironing, folding) 2  18. Opening a jar 2  19. Throwing a ball 2  20. Carrying a small suitcase with your affected limb.  2  Score total:  42/80     COGNITION: Overall cognitive status: Within functional limits for tasks assessed     SENSATION: Impaired RUE in wrist extensor region and lateral 3 fingers posteriorly   POSTURE: rounded shoulders, forward head, increased thoracic kyphosis, anterior pelvic tilt, and flexed trunk    UPPER EXTREMITY ROM: WFL bilateral UE  Cervical AROM:    01/23/2024      Flexion  0% limited * (worst)     Extension  25% limited *     R ROT 0% limited     L ROT  0% limited     R SB   50% limited*    L SB 25% limited     * symptoms (RUE radicular  symptoms) (Blank rows = not tested)    UPPER EXTREMITY MMT:  MMT Right eval Left eval  Shoulder flexion 4+ 4+  Shoulder extension    Shoulder abduction 4+ 4+  Shoulder adduction    Shoulder internal rotation 5 5  Shoulder external rotation 4+ 4+  Middle trapezius    Lower trapezius    Elbow flexion 4+ 5  Elbow extension 5 5  Wrist flexion 5 5  Wrist extension 5 5  Wrist ulnar deviation    Wrist radial deviation    Wrist pronation    Wrist supination    Grip strength (lbs) 42# 22#  (Blank rows = not tested) *=pain/symptoms     PALPATION:  TTP bilateral periscap musculature with R radicular symptoms with palpation of R levator scap, rhomboids, middle trap, UT   TODAY'S TREATMENT:  DATE:  02/27/24 Manual: Inferior glades first rib on R; STM to UT, levator scapulae, infraspinatus  Seated scalenes stretch 3 x 20 second holds Supine scap retractions on half foam roll 1 x 10  Supine punches 2 x 10  Supine shoulder PNF D2 RTB 2 x 10   02/14/24 Manual: Inferior glades first rib on R; STM to UT, levator scapulae, infraspinatus  Seated UT scalenes stretch with manual assist Radial nerve glides 2 x 10 Seated scalenes stretch 3 x 20 second holds Standing shoulder horizontal abduction RTB 2 x 10 Education on cane use and UE mechanics  02/06/24 Manual: Inferior glades first rib on R; STM to UT, levator scapulae, infraspinatus  Seated cervical retractions 2 x 10 Seated bilateral ER with scap retraction RTB 3 x 5 Standing shoulder extension RTB 3 x 10 Seated thoracic extension over chair 5 x 5 second holds  01/30/24 Manual: Inferior glades first rib on R; STM to UT, levator scapulae, infraspinatus  Standing row RTB 2 x 10 Seated cervical retractions 2 x 10  01/23/24 Eval, education, HEP  PATIENT EDUCATION:  Education details: Patient  educated on exam findings, POC, scope of PT, HEP, relevant anatomy and biomechanics, and self STM with tennis ball. Person educated: Patient Education method: Explanation, Demonstration, and Handouts Education comprehension: verbalized understanding, returned demonstration, verbal cues required, and tactile cues required  HOME EXERCISE PROGRAM: Access Code: [email or web address] URL: [email or web address] Date: 01/23/2024 Prepared by: Prentice Olita Takeshita  Exercises - Seated Scapular Retraction  - 2 x daily - 7 x weekly - 2 sets - 10 reps  ASSESSMENT:  CLINICAL IMPRESSION: Waxing/waning of symptoms with manual with resolution of symptoms for short periods following.  Supine 1/2 foam exercise not tolerated well but able to complete. PNF exercise with large improvement in symptoms. Patient will continue to benefit from physical therapy in order to improve function and reduce impairment.   OBJECTIVE IMPAIRMENTS: decreased activity tolerance, decreased endurance, decreased mobility, decreased ROM, decreased strength, increased muscle spasms, impaired flexibility, improper body mechanics, postural dysfunction, and pain.   ACTIVITY LIMITATIONS: carrying, lifting, bending, stairs, transfers, bathing, reach over head, hygiene/grooming, and caring for others  PARTICIPATION LIMITATIONS: meal prep, cleaning, laundry, shopping, community activity, and yard work  PERSONAL FACTORS: Fitness, Time since onset of injury/illness/exacerbation, and 3+ comorbidities: CHF, DM neuropathy (sock distribution), CAD, previous uterine cancer are also affecting patient's functional outcome.   REHAB POTENTIAL: Good  CLINICAL DECISION MAKING: Evolving/moderate complexity  EVALUATION COMPLEXITY: Moderate   GOALS: Goals reviewed with patient? Yes  SHORT TERM GOALS: Target date: 02/20/2024    Patient will be independent with HEP in order to improve functional outcomes. Baseline: Goal status: INITIAL  2.   Patient will report at least 25% improvement in symptoms for improved quality of life. Baseline:  Goal status: INITIAL    LONG TERM GOALS: Target date: 03/19/2024    Patient will report at least 75% improvement in symptoms for improved quality of life. Baseline:  Goal status: INITIAL  2.  Patient will improve UEFS score by at least 9 points in order to indicate improved tolerance to activity. Baseline:  Goal status: INITIAL  3.  Patient will have reduction in tissue tension in R periscap region for reduction in symptoms. Baseline:  Goal status: INITIAL  4.  Patient will be able to return to all activities unrestricted for improved ability to exercise. Baseline:  Goal status: INITIAL  5.  Patient will demonstrate grade of 5/5 MMT grade in all tested musculature as evidence  of improved strength to assist with lifting at home. Baseline:  Goal status: INITIAL   PLAN:  PT FREQUENCY: 1-2x/week  PT DURATION: 8 weeks  PLANNED INTERVENTIONS: 97164- PT Re-evaluation, 97110-Therapeutic exercises, 97530- Therapeutic activity, 97112- Neuromuscular re-education, 97535- Self Care, [id number]- Manual therapy, [phone]- Gait training, [phone]- Orthotic Fit/training, [phone]- Canalith repositioning, [id number]- Aquatic Therapy, [phone]- Splinting, Patient/Family education, Balance training, Stair training, Taping, Dry Needling, Joint mobilization, Joint manipulation, Spinal manipulation, Spinal mobilization, Scar mobilization, and DME instructions.   PLAN FOR NEXT SESSION: possibly manual to periscap, postural and UE strengthening   Prentice GORMAN Stains, PT, DPT 02/27/2024, 10:17 AM   Date of referral: 12/17/23 Referring provider: Oris Camie BRAVO, NP Referring diagnosis? M75.41 (ICD-10-CM) - Internal impingement of right shoulder Treatment diagnosis? (if different than referring diagnosis) M25.511 Pain in R shoulder  What was this (referring dx) caused by? Ongoing Issue  Lysle of Condition: Initial Onset  (within last 3 months)   Laterality: Rt  Current Functional Measure Score: Other UEFS:  42/80   Objective measurements identify impairments when they are compared to normal values, the uninvolved extremity, and prior level of function.  [x]  Yes  []  No  Objective assessment of functional ability: Moderate functional limitations   Briefly describe symptoms: pain in R shoulder blade/neck with R radicular symptoms  How did symptoms start: aquatic exercise/insidious onset  Average pain intensity:  Last 24 hours: 3-4/10  Past week: 3-4/10  How often does the pt experience symptoms? Constantly  How much have the symptoms interfered with usual daily activities? Quite a bit  How has condition changed since care began at this facility? NA - initial visit  In general, how is the patients overall health? Good   BACK PAIN (STarT Back Screening Tool) No

## 2024-03-03 ENCOUNTER — Other Ambulatory Visit (HOSPITAL_COMMUNITY): Payer: Self-pay

## 2024-03-05 ENCOUNTER — Telehealth: Payer: Self-pay | Admitting: Pharmacy Technician

## 2024-03-05 ENCOUNTER — Other Ambulatory Visit (HOSPITAL_BASED_OUTPATIENT_CLINIC_OR_DEPARTMENT_OTHER): Payer: Self-pay

## 2024-03-05 ENCOUNTER — Other Ambulatory Visit (HOSPITAL_COMMUNITY): Payer: Self-pay

## 2024-03-05 NOTE — Telephone Encounter (Signed)
 Pharmacy Patient Advocate Encounter   Received notification from Fax that prior authorization for repatha  is required/requested.   Insurance verification completed.   The patient is insured through [place] .   Per test claim: PA required; PA submitted to above mentioned insurance via CoverMyMeds Key/confirmation #/EOC [id number] Status is pending

## 2024-03-05 NOTE — Telephone Encounter (Signed)
 Pharmacy Patient Advocate Encounter  Received notification from [facility] that Prior Authorization for Repatha  has been APPROVED from 03/05/24 to 09/05/24. Ran test claim, Copay is $0.00- one month. This test claim was processed through [facility]- copay amounts may vary at other pharmacies due to pharmacy/plan contracts, or as the patient moves through the different stages of their insurance plan.   PA #/Case ID/Reference #: [id number]

## 2024-03-06 ENCOUNTER — Other Ambulatory Visit (HOSPITAL_BASED_OUTPATIENT_CLINIC_OR_DEPARTMENT_OTHER): Payer: Self-pay

## 2024-03-06 ENCOUNTER — Ambulatory Visit (HOSPITAL_BASED_OUTPATIENT_CLINIC_OR_DEPARTMENT_OTHER): Admitting: Physical Therapy

## 2024-03-06 ENCOUNTER — Other Ambulatory Visit (HOSPITAL_COMMUNITY): Payer: Self-pay

## 2024-03-06 ENCOUNTER — Telehealth: Payer: Self-pay

## 2024-03-06 ENCOUNTER — Encounter (HOSPITAL_BASED_OUTPATIENT_CLINIC_OR_DEPARTMENT_OTHER): Payer: Self-pay | Admitting: Physical Therapy

## 2024-03-06 DIAGNOSIS — R2689 Other abnormalities of gait and mobility: Secondary | ICD-10-CM | POA: Diagnosis not present

## 2024-03-06 DIAGNOSIS — R29898 Other symptoms and signs involving the musculoskeletal system: Secondary | ICD-10-CM

## 2024-03-06 DIAGNOSIS — M6281 Muscle weakness (generalized): Secondary | ICD-10-CM

## 2024-03-06 DIAGNOSIS — M25511 Pain in right shoulder: Secondary | ICD-10-CM

## 2024-03-06 NOTE — Telephone Encounter (Signed)
 Pharmacy Patient Advocate Encounter   Received notification from CoverMyMeds that prior authorization for Lidocaine  5% patchesis required/requested.   Insurance verification completed.   The patient is insured through [facility].   Per test claim: PA required; PA submitted to above mentioned insurance via CoverMyMeds Key/confirmation #/EOC (Key: [id number])    Status is pending

## 2024-03-06 NOTE — Therapy (Signed)
 OUTPATIENT PHYSICAL THERAPY SHOULDER EVALUATION   Patient Name: Terri Heath MRN: [id number] DOB:Jul 02, 1956, 68 y.o., female Today's Date: 03/06/2024  PHYSICAL THERAPY DISCHARGE SUMMARY  Visits from Start of Care: 6  Current functional level related to goals / functional outcomes: See below   Remaining deficits: See below   Education / Equipment: See below   Patient agrees to discharge. Patient goals were met. Patient is being discharged due to meeting the stated rehab goals.   END OF SESSION:  PT End of Session - 03/06/24 1351     Visit Number 6    Number of Visits 16    Date for PT Re-Evaluation 03/19/24    Authorization Type [employer]    Progress Note Due on Visit 10    PT Start Time 1349    PT Stop Time 1420    PT Time Calculation (min) 31 min    Activity Tolerance Patient tolerated treatment well    Behavior During Therapy WFL for tasks assessed/performed          Past Medical History:  Diagnosis Date   Abnormal vaginal bleeding in postmenopausal patient 10/11/2021   Acute systolic heart failure (HCC)    Allergy    Asthma    Bilateral shoulder pain    Borderline hypertension    Bronchitis    CAD (coronary artery disease)    Chronic systolic CHF (congestive heart failure) (HCC)    Chronic systolic congestive heart failure, NYHA class 3 (HCC) 07/24/2018   Closed fracture of distal fibula 07/17/2022   Dehydration 12/20/2021   Diabetes mellitus    Diagnosed in 2000, on insulin , Trulicity  and metformin , not checking cgs at home   Drug-induced hypotension 12/20/2021   Elevated serum creatinine 12/20/2021   Encounter to establish care 01/06/2021   Gangrenous appendicitis with perforation s/p lap appendectomy 10/04/2017 10/04/2017   GERD (gastroesophageal reflux disease)    History of MI (myocardial infarction)    History of radiation therapy    Uterus- 12/14/21-02/01/22- Dr. Lynwood Nasuti   Hyperlipidemia    Hypertension     Hypothyroidism    MGUS (monoclonal gammopathy of unknown significance)    Mitral regurgitation    Myocardial infarction (HCC)    Neuropathy    Obesity    Rash of back 01/06/2021   Sleep apnea    uses CPAP   Statin intolerance    Uterine cancer [facility])    Past Surgical History:  Procedure Laterality Date   BREATH TEK H PYLORI  09/18/2011   Procedure: BREATH TEK H PYLORI;  Surgeon: Alm VEAR Angle, MD;  Location: [facility];  Service: General;  Laterality: N/A;   CESAREAN SECTION     x 3   IR IMAGING GUIDED PORT INSERTION  12/07/2021   IR REMOVAL TUN ACCESS W/ PORT W/O FL MOD SED  04/04/2022   LAPAROSCOPIC APPENDECTOMY N/A 10/03/2017   Procedure: APPENDECTOMY LAPAROSCOPIC;  Surgeon: Debby Hila, MD;  Location: [facility];  Service: General;  Laterality: N/A;   OPERATIVE ULTRASOUND N/A 02/01/2022   Procedure: OPERATIVE ULTRASOUND;  Surgeon: Nasuti Lynwood, MD;  Location: [facility];  Service: Urology;  Laterality: N/A;   RIGHT/LEFT HEART CATH AND CORONARY ANGIOGRAPHY N/A 02/12/2018   Procedure: RIGHT/LEFT HEART CATH AND CORONARY ANGIOGRAPHY;  Surgeon: Dann Candyce RAMAN, MD;  Location: [facility] INVASIVE CV LAB;  Service: Cardiovascular;  Laterality: N/A;   ROBOTIC ASSISTED TOTAL HYSTERECTOMY WITH BILATERAL SALPINGO OOPHERECTOMY Bilateral 03/21/2022   Procedure: XI ROBOTIC ASSISTED TOTAL HYSTERECTOMY WITH BILATERAL SALPINGO OOPHORECTOMY,  LYSIS OF ADHESIONS, CYSTOSCOPY;  Surgeon: Viktoria Comer SAUNDERS, MD;  Location: [facility];  Service: Gynecology;  Laterality: Bilateral;   TANDEM RING INSERTION N/A 02/01/2022   Procedure: TANDEM RING INSERTION;  Surgeon: Shannon Agent, MD;  Location: [facility];  Service: Urology;  Laterality: N/A;   TUBAL LIGATION     Patient Active Problem List   Diagnosis Date Noted   Earlobe lesion, right 02/25/2024   Subscapular pain, right 12/17/2023   Sciatic nerve pain 08/28/2023   Cellulitis of perineum 06/11/2023   Hyperglycemia due to diabetes mellitus (HCC) 06/11/2023   Labial  abscess 06/07/2023   Statin myopathy 05/03/2023   Hypomagnesemia 01/17/2022   Pancytopenia, acquired (HCC) 01/03/2022   Diabetic peripheral neuropathy associated with type 2 diabetes mellitus (HCC) 12/02/2021   History of endometrial cancer 11/07/2021   Vitamin D deficiency 01/06/2021   Claudication in peripheral vascular disease (HCC) 09/16/2020   Chronic systolic CHF (congestive heart failure), NYHA class 3 (HCC) 07/24/2018   Acquired hypothyroidism 07/24/2018   Cardiomyopathy, ischemic 07/24/2018   MGUS (monoclonal gammopathy of unknown significance) 03/06/2018   Dyspnea 01/14/2018   Allergic rhinitis 06/11/2014   Asthma due to seasonal allergies 10/24/2012   OSA (obstructive sleep apnea) 05/23/2011   Poorly controlled type 2 diabetes mellitus with circulatory disorder (HCC) 03/26/2007   Hyperlipidemia associated with type 2 diabetes mellitus (HCC) 03/26/2007   Obesity, Class III, BMI 40-49.9 (morbid obesity) 03/26/2007   Hypertension associated with type 2 diabetes mellitus (HCC) 03/26/2007    PCP: Oris Camie BRAVO, NP  REFERRING PROVIDER: Oris Camie BRAVO, NP  REFERRING DIAG: M75.41 (ICD-10-CM) - Internal impingement of right shoulder  THERAPY DIAG:  Right shoulder pain, unspecified chronicity  Muscle weakness (generalized)  Other abnormalities of gait and mobility  Other symptoms and signs involving the musculoskeletal system  Rationale for Evaluation and Treatment: Rehabilitation  ONSET DATE: 2-3 months  SUBJECTIVE:                                                                                                                                                                                      SUBJECTIVE STATEMENT: Patient states arm is still tingling but aching less. Using lidocaine  patches on spots where we push in here and it helps. Using cane properly. Ready to get back into pool. Doing HEP. Improving lifting at home. Patient states 95% improvement in  symptoms/functional status. Patient remains limited by tingling feeling and awareness. Pretty much feels back to normal.   EVAL: Did PT a year ago for her knee and did great. Had a few months where she couldn't do the pool. Spend some time with health issues. Back in pool but  some upper back symptoms. RUE tingling/numbness. Wants to build upper body strength. Symptoms began about 2-3 months ago with walking in pool.  Hand dominance: Right  PERTINENT HISTORY: CHF, DM neuropathy (sock distribution), CAD, previous uterine cancer  PAIN:  Are you having pain? Yes: NPRS scale: tingling 1/10 Pain location: R periscap Pain description: ache with RUE numb/tingling Aggravating factors: aquatics Relieving factors: meds, heat  PRECAUTIONS: Other: Adhesive Allergy*  RED FLAGS: None   WEIGHT BEARING RESTRICTIONS: No  FALLS:  Has patient fallen in last 6 months? No  OCCUPATION: previously [employer], EKG   PLOF: Independent  PATIENT GOALS:pain management/overcome pain   OBJECTIVE: (objective measures from initial evaluation unless otherwise dated)  PATIENT SURVEYS: UEFS  Extreme difficulty/unable (0), Quite a bit of difficulty (1), Moderate difficulty (2), Little difficulty (3), No difficulty (4) Survey date:  01/23/24 03/06/24  Any of your usual work, household or school activities 2 3  2. Your usual hobbies, recreational/sport activities 1 3   3. Lifting a bag of groceries to waist level 3 3   4. Lifting a bag of groceries above your head 1 3  5. Grooming your hair 3 4  6. Pushing up on your hands (I.e. from bathtub or chair) 2 4  7. Preparing food (I.e. peeling/cutting) 1 3  8. Driving  3 4  9. Vacuuming, sweeping, or raking 1 3  10. Dressing  3 4  11. Doing up buttons 3 4  12. Using tools/appliances 2 3  13. Opening doors 3 3  14. Cleaning  3 3  15. Tying or lacing shoes 2 3  16. Sleeping  1 4  17. Laundering clothes (I.e. washing, ironing, folding) 2 3  18. Opening a jar 2 3   19. Throwing a ball 2 3  20. Carrying a small suitcase with your affected limb.  2 3  Score total:  42/80 66/80     COGNITION: Overall cognitive status: Within functional limits for tasks assessed     SENSATION: Impaired RUE in wrist extensor region and lateral 3 fingers posteriorly   POSTURE: rounded shoulders, forward head, increased thoracic kyphosis, anterior pelvic tilt, and flexed trunk    UPPER EXTREMITY ROM: WFL bilateral UE  Cervical AROM:    01/23/2024   03/06/24   Flexion  0% limited * (worst)  0% limited   Extension  25% limited *  0% limited   R ROT 0% limited  0% limited*   L ROT  0% limited  0% limited   R SB   50% limited*  25% limited*  L SB 25% limited 25% limited    * symptoms (RUE radicular symptoms) (Blank rows = not tested)    UPPER EXTREMITY MMT:  MMT Right eval Left eval Right 03/06/24 Left 03/06/24  Shoulder flexion 4+ 4+ 5 5  Shoulder extension      Shoulder abduction 4+ 4+ 5 5  Shoulder adduction      Shoulder internal rotation 5 5 5 5   Shoulder external rotation 4+ 4+ 5 5  Middle trapezius      Lower trapezius      Elbow flexion 4+ 5 5 5   Elbow extension 5 5 5 5   Wrist flexion 5 5    Wrist extension 5 5    Wrist ulnar deviation      Wrist radial deviation      Wrist pronation      Wrist supination      Grip strength (lbs) 42# 22#  42# 27#  (Blank rows = not tested) *=pain/symptoms     PALPATION:  TTP bilateral periscap musculature with R radicular symptoms with palpation of R levator scap, rhomboids, middle trap, UT   TODAY'S TREATMENT:                                                                                                                                         DATE:  03/06/24 Reassessment   02/27/24 Manual: Inferior glades first rib on R; STM to UT, levator scapulae, infraspinatus  Seated scalenes stretch 3 x 20 second holds Supine scap retractions on half foam roll 1 x 10  Supine punches 2 x 10  Supine shoulder  PNF D2 RTB 2 x 10   02/14/24 Manual: Inferior glades first rib on R; STM to UT, levator scapulae, infraspinatus  Seated UT scalenes stretch with manual assist Radial nerve glides 2 x 10 Seated scalenes stretch 3 x 20 second holds Standing shoulder horizontal abduction RTB 2 x 10 Education on cane use and UE mechanics  02/06/24 Manual: Inferior glades first rib on R; STM to UT, levator scapulae, infraspinatus  Seated cervical retractions 2 x 10 Seated bilateral ER with scap retraction RTB 3 x 5 Standing shoulder extension RTB 3 x 10 Seated thoracic extension over chair 5 x 5 second holds  01/30/24 Manual: Inferior glades first rib on R; STM to UT, levator scapulae, infraspinatus  Standing row RTB 2 x 10 Seated cervical retractions 2 x 10  01/23/24 Eval, education, HEP  PATIENT EDUCATION:  Education details: Patient educated on exam findings, POC, scope of PT, HEP, relevant anatomy and biomechanics, and self STM with tennis ball. Person educated: Patient Education method: Explanation, Demonstration, and Handouts Education comprehension: verbalized understanding, returned demonstration, verbal cues required, and tactile cues required  HOME EXERCISE PROGRAM: Access Code: [email or web address] URL: [email or web address] Date: 01/23/2024 Prepared by: Prentice Porfirio Bollier  Exercises - Seated Scapular Retraction  - 2 x daily - 7 x weekly - 2 sets - 10 reps  ASSESSMENT:  CLINICAL IMPRESSION: Patient has met 2/2 short term goals and 5/5 long term goals with ability to complete HEP and improvement in symptoms, strength, ROM, activity tolerance, gait, balance, and functional mobility. Patient remains limited by continued radicular symptoms  but able to manage symptoms with HEP. Patient is eager to return to aquatic exercises and HEP and is instructed on returning if needed. Patient discharged from PT at this time.     OBJECTIVE IMPAIRMENTS: decreased activity tolerance, decreased  endurance, decreased mobility, decreased ROM, decreased strength, increased muscle spasms, impaired flexibility, improper body mechanics, postural dysfunction, and pain.   ACTIVITY LIMITATIONS: carrying, lifting, bending, stairs, transfers, bathing, reach over head, hygiene/grooming, and caring for others  PARTICIPATION LIMITATIONS: meal prep, cleaning, laundry, shopping, community activity, and yard work  PERSONAL FACTORS: Fitness, Time since onset of injury/illness/exacerbation, and 3+ comorbidities: CHF, DM neuropathy (sock distribution), CAD,  previous uterine cancer are also affecting patient's functional outcome.   REHAB POTENTIAL: Good  CLINICAL DECISION MAKING: Evolving/moderate complexity  EVALUATION COMPLEXITY: Moderate   GOALS: Goals reviewed with patient? Yes  SHORT TERM GOALS: Target date: 02/20/2024    Patient will be independent with HEP in order to improve functional outcomes. Baseline: Goal status: MET  2.  Patient will report at least 25% improvement in symptoms for improved quality of life. Baseline:  Goal status: MET    LONG TERM GOALS: Target date: 03/19/2024    Patient will report at least 75% improvement in symptoms for improved quality of life. Baseline:  Goal status: MET  2.  Patient will improve UEFS score by at least 9 points in order to indicate improved tolerance to activity. Baseline:  Goal status: MET  3.  Patient will have reduction in tissue tension in R periscap region for reduction in symptoms. Baseline:  Goal status: MET  4.  Patient will be able to return to all activities unrestricted for improved ability to exercise. Baseline:  Goal status: MET  5.  Patient will demonstrate grade of 5/5 MMT grade in all tested musculature as evidence of improved strength to assist with lifting at home. Baseline:  Goal status: MET   PLAN:  PT FREQUENCY: 1-2x/week  PT DURATION: 8 weeks  PLANNED INTERVENTIONS: 97164- PT Re-evaluation,  97110-Therapeutic exercises, 97530- Therapeutic activity, 97112- Neuromuscular re-education, 97535- Self Care, [id number]- Manual therapy, [phone]- Gait training, [phone]- Orthotic Fit/training, [phone]- Canalith repositioning, [id number]- Aquatic Therapy, [phone]- Splinting, Patient/Family education, Balance training, Stair training, Taping, Dry Needling, Joint mobilization, Joint manipulation, Spinal manipulation, Spinal mobilization, Scar mobilization, and DME instructions.   PLAN FOR NEXT SESSION: possibly manual to periscap, postural and UE strengthening   Prentice GORMAN Stains, PT, DPT 03/06/2024, 2:28 PM   Date of referral: 12/17/23 Referring provider: Oris Camie BRAVO, NP Referring diagnosis? M75.41 (ICD-10-CM) - Internal impingement of right shoulder Treatment diagnosis? (if different than referring diagnosis) M25.511 Pain in R shoulder  What was this (referring dx) caused by? Ongoing Issue  Lysle of Condition: Initial Onset (within last 3 months)   Laterality: Rt  Current Functional Measure Score: Other UEFS:  42/80   Objective measurements identify impairments when they are compared to normal values, the uninvolved extremity, and prior level of function.  [x]  Yes  []  No  Objective assessment of functional ability: Moderate functional limitations   Briefly describe symptoms: pain in R shoulder blade/neck with R radicular symptoms  How did symptoms start: aquatic exercise/insidious onset  Average pain intensity:  Last 24 hours: 3-4/10  Past week: 3-4/10  How often does the pt experience symptoms? Constantly  How much have the symptoms interfered with usual daily activities? Quite a bit  How has condition changed since care began at this facility? NA - initial visit  In general, how is the patients overall health? Good   BACK PAIN (STarT Back Screening Tool) No

## 2024-03-07 ENCOUNTER — Other Ambulatory Visit (HOSPITAL_BASED_OUTPATIENT_CLINIC_OR_DEPARTMENT_OTHER): Payer: Self-pay

## 2024-03-10 ENCOUNTER — Other Ambulatory Visit (HOSPITAL_COMMUNITY): Payer: Self-pay

## 2024-03-10 NOTE — Telephone Encounter (Signed)
 Pharmacy Patient Advocate Encounter  Received notification from  [facility] MEDICARE that Prior Authorization for Lidocaine  5% patches  has been APPROVED from 7.10.25 to 12.31.25. Ran test claim, Copay is $RTS, RX HAS BEEN FILLED AS OF 7.11.25. This test claim was processed through [facility]- copay amounts may vary at other pharmacies due to pharmacy/plan contracts, or as the patient moves through the different stages of their insurance plan.   PA #/Case ID/Reference #: (Key: [id number])

## 2024-03-11 ENCOUNTER — Telehealth: Payer: Self-pay

## 2024-03-11 NOTE — Telephone Encounter (Signed)
 [name] called office to cancel her upcoming appointment with Dr.Tucker on 7/18. She declined rescheduling at this time stating it has been two years and she is feeling fine.   She wanted Dr.Tucker to know she is very thankful for the great care she has received. She will call if anything arises in this area.

## 2024-03-11 NOTE — Telephone Encounter (Signed)
 Per Eleanor Epps NP, LVM for Ms.Battey to call office regarding previous call about follow up appointments with Dr.Tucker.

## 2024-03-12 ENCOUNTER — Encounter (HOSPITAL_BASED_OUTPATIENT_CLINIC_OR_DEPARTMENT_OTHER): Admitting: Physical Therapy

## 2024-03-12 NOTE — Telephone Encounter (Signed)
 Left another voicemail for Ms.Ontko to call office.

## 2024-03-12 NOTE — Telephone Encounter (Signed)
 Agree - please let the patient know we recommend continued visits. If we can help her find somewhere closer, happy to help

## 2024-03-14 ENCOUNTER — Ambulatory Visit: Admitting: Gynecologic Oncology

## 2024-03-18 ENCOUNTER — Encounter (HOSPITAL_BASED_OUTPATIENT_CLINIC_OR_DEPARTMENT_OTHER): Admitting: Physical Therapy

## 2024-03-18 NOTE — Progress Notes (Signed)
 PCP: Oris Camie BRAVO, NP  [facility] Cardiology: Dr. Rolan  68 y.o. with history of chronic systolic CHF, CAD, and MGUS was referred by Dr. Dann for evaluation of CHF.  Patient first developed exertional dyspnea after her appendectomy in 2/19.  By 5/19, she was gaining weight and had developed a chronic cough. She never had chest pain.  Echo was done in 5/19 showing EF 25-30%, moderate MR, moderate RV dilation.  RHC/LHC was done in 6/19 showing elevated filling pressures, preserved cardiac output, and occluded mid LCx and distal RCA.  She was found to have IgM monoclonal antibody.  Skeletal survey showed no lytic lesions, likely MGUS.  Cardiac MRI in 8/19 showed EF 32%, subendocardial scar (consistent with prior MI) at inferior base, moderately dilated RV with RV EF 20%. No evidence for cardiac amyloidosis by MRI.  Repeat echo in 10/19 showed EF remaining 30-35%.  She was seen by Dr. Kelsie and declined ICD.   Echo in 10/20 showed EF 40-45%.   She was unable to tolerate Entresto  97/103 due to lightheadedness.   Echo in 10/22 showed EF 50% with inferolateral hypokinesis, RV normal, IVC normal.   She has been diagnosed with uterine cancer and had cisplatin  chemotherapy and radiation.  She had a hysterectomy.   Echo in 12/23 showed EF 50-55%, mild RV enlargement with normal RV systolic function.   She returns for followup of CHF.  Weight is down 14 lbs since last in this office.  She is taking semaglutide , gets from a friend.  She is chronically short of breath walking 50-100 feet.  She does some walking in a pool for exercise, has not been doing this as much as she would like.  No chest pain.  She continues to have neuropathic pain in her feet which is limiting.  No orthopnea, PND, palpitations.  She has OSA but is not using CPAP, following with Dr. Alva for this.   ECG (personally reviewed): NSR, poor RWP, nonspecific T wave changes.   Labs (5/19): transferrin saturation 20%, immunofixation with IgM  monoclonal antibody.  Labs (6/19): K 4.5, creatinine 0.79 Labs (7/19): K 4.1, creatinine 0.86, TSH normal Labs (9/19): K 4.4, creatinine 0.61 Labs (10/19): LDL 187 Labs (12/19): LDL 82 Labs (1/20): K 4, creatinine 0.81 Labs (8/20): K 4.4, creatinine 0.82, LDL 92 Labs (11/21): LDL 88, HDl 52, TGs 208 Labs (1/22): K 4.5, creatinine 1.02 Labs (5/22): K 4.2, creatinine 0.96 Labs( 5/23): K 4.5, creatinine 0.93, hgb 11.5 Labs (7/23): K 4.9, creatinine 0.97 Labs (9/24): LDL 89 Labs (12/24): K 5.2, creatinine 1.15  PMH: 1. Type II diabetes 2. OSA: Not currently on CPAP.  3. H/o appendectomy 4. 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  Skeletal survey in 7/19 showed no lytic lesions.  6. 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   - Echo (5/19): EF 25-30%, mild LV dilation, moderate MR, moderately dilated RV.  - LHC/RHC (6/19): distal RCA totally occluded, mid LCx totally occluded, EF < 25%. No intervention. Mean RA 10, mean PA 42, mean PCWP 29, CI 2.3.  - Cardiac MRI in 8/19 showed EF 32%, subendocardial scar (consistent with prior MI) at inferior base, moderately dilated RV with RV EF 20%. No evidence for cardiac amyloidosis by MRI.  - Echo (10/19): EF 30-35%.   - Echo (10/20): EF 40-45%, mild LVH, mild MR - Echo (10/22): EF 50% with inferolateral hypokinesis, RV normal, IVC normal. - Echo (12/23): EF 50-55%, mild RV enlargement with normal  RV systolic function 7. CAD: LHC (6/19) with distal RCA totally occluded, mid LCx totally occluded, EF < 25%. No intervention. 8. Hyperlipidemia: Has not tolerated statins.  9. ABIs (8/19): Normal.  10. COVID-19 infection in 9/22.   SH: Married, retired Engineer, civil (consulting), quit smoking in 1991.    FH: Mother with rheumatic MV disease, s/p MVR.   ROS: All systems reviewed and negative except as per HPI.   Current Outpatient Medications  Medication Sig Dispense Refill   acetaminophen  (TYLENOL ) 500 MG  tablet Take 500-1,000 mg by mouth every 6 (six) hours as needed for mild pain or moderate pain.     albuterol  (PROAIR  HFA) 108 (90 Base) MCG/ACT inhaler Inhale 2 puffs into the lungs every 6 (six) hours as needed for wheezing 13.4 g 2   aspirin  81 MG chewable tablet Chew 1 tablet (81 mg total) by mouth daily. 90 tablet 3   blood glucose meter kit and supplies Use up to four times daily as directed. (FOR ICD-10 E10.9, E11.9). 1 each 99   carvedilol  (COREG ) 6.25 MG tablet Take 1 tablet (6.25 mg total) by mouth 2 (two) times daily. Needs 180 tablet 1   cholecalciferol (VITAMIN D3) 25 MCG (1000 UNIT) tablet Take 1,000 Units by mouth daily.     Cyanocobalamin (VITAMIN B 12) 500 MCG TABS Take 500 mcg by mouth daily at 12 noon.     diclofenac  Sodium (VOLTAREN ) 1 % GEL Apply 2g to the affected area(s) topically as needed. 300 g 3   Evolocumab  (REPATHA  SURECLICK) 140 MG/ML SOAJ Inject 140 mg into the skin every 14 (fourteen) days. 6 mL 3   furosemide  (LASIX ) 20 MG tablet Take 1 tablet (20 mg total) by mouth daily. 90 tablet 1   glucose blood test strip Use to check blood sugar up to 4 times daily 100 each PRN   insulin  glargine (LANTUS  SOLOSTAR) 100 UNIT/ML Solostar Pen Inject 25-40 Units into the skin daily. 45 mL 3   Insulin  Pen Needle (BD PEN NEEDLE NANO 2ND GEN) 32G X 4 MM MISC Use as directed daily. 100 each 3   Krill Oil 1000 MG CAPS Take 1,000 mg by mouth 2 (two) times daily.     levothyroxine  (SYNTHROID ) 50 MCG tablet Take 1 tablet (50 mcg total) by mouth daily, before a meal. 90 tablet 1   lidocaine  (LIDODERM ) 5 % Place 1 patch onto the shoulder daily. Remove & Discard patch within 12 hours or as directed by your provider. 30 patch 3   MAGNESIUM  GLYCINATE PO Take 1 tablet by mouth daily.     metFORMIN  (GLUCOPHAGE ) 1000 MG tablet Take 1 tablet (1,000 mg total) by mouth 2 (two) times daily with a meal. 180 tablet 1   Multiple Vitamin (MULITIVITAMIN WITH MINERALS) TABS Take 1 tablet by mouth daily  with breakfast.     naproxen  sodium (ALEVE ) 220 MG tablet Take 440 mg by mouth daily as needed (pain).     OneTouch Delica Lancets 33G MISC Use to check blood sugars 4 (four) times daily. 100 each 1   sacubitril -valsartan  (ENTRESTO ) 24-26 MG Take 1 tablet by mouth 2 (two) times daily. 180 tablet 1   Semaglutide ,0.25 or 0.5MG /DOS, (OZEMPIC , 0.25 OR 0.5 MG/DOSE,) 2 MG/3ML SOPN Inject into the skin.     spironolactone  (ALDACTONE ) 25 MG tablet Take 1 tablet (25 mg total) by mouth daily. 90 tablet 1   triamcinolone  ointment (KENALOG ) 0.1 % Apply 1 application topically two times daily to affected area(s) as needed, sparing  use to avoid whitening/thinning skin (Patient taking differently: Apply 1 application  topically as needed.) 30 g 1   Turmeric 12-998 MG CAPS Take 1,000 mg by mouth daily at 12 noon.     No current facility-administered medications for this visit.   There were no vitals taken for this visit. General: NAD, obese.  Neck: No JVD, no thyromegaly or thyroid  nodule.  Lungs: Clear to auscultation bilaterally with normal respiratory effort. CV: Nondisplaced PMI.  Heart regular S1/S2, no S3/S4, no murmur.  Trace ankle edema.  No carotid bruit.  Normal pedal pulses.  Abdomen: Soft, nontender, no hepatosplenomegaly, no distention.  Skin: Intact without lesions or rashes.  Neurologic: Alert and oriented x 3.  Psych: Normal affect. Extremities: No clubbing or cyanosis.  HEENT: Normal.   Assessment/Plan: 1. Chronic systolic CHF: Echo in 5/19 with EF 25-30%.  She had occluded mid LCx and distal RCA on coronary angiography.  Suspect ischemic cardiomyopathy.  She also has a monoclonal antibody.  Cardiac MRI in 8/19 showed LV EF 32% and RV EF 30%, subendocardial scar at inferior base consistent with prior MI, no evidence for cardiac amyloidosis.  Repeat echo in 10/19 showed EF 30-35%.  Patient saw Dr. Kelsie and declined ICD.  However, echo in 10/20 showed EF up to 40-45% and out of ICD range.  Echo in 10/22 showed EF 50% with inferolateral hypokinesis, RV normal, IVC normal.  Echo in 12/23 was stable with EF 50-55%.  NYHA class II-III symptoms, not volume overloaded on exam.  Symptoms confounded by obesity/deconditioning and neuropathic pain.    - Continue Entresto  24/26 bid, decreased in the past with orthostatic symptoms.  - Continue spironolactone  25 mg daily.  - Continue Coreg  6.25 mg bid.  - No SGLT2 inhibitor given history of severe GU infections (labial cellulitis most recently).    - Continue Lasix  20 mg daily, BMET/BNP today.  - I will arrange for echo.  2. CAD: Occluded mLCx and dRCA on cath in 6/19.  No interventional target.   - Continue ASA 81 daily.  - She has been taking Repatha  due to cramps with Crestor .  Check lipids today, goal LDL < 55.  3. Obesity:  Work on diet and exercise.  Weight trending down, has been getting semaglutide  from a friend.  - Refer to pharmacy clinic to see if she could get semaglutide  or tirzepatide  through her insurance this year.  She has diabetes.   Followup 6 months with APP if echo is stable.   Harlene HERO [facility] 03/18/2024

## 2024-03-21 ENCOUNTER — Ambulatory Visit (HOSPITAL_COMMUNITY)
Admission: RE | Admit: 2024-03-21 | Discharge: 2024-03-21 | Disposition: A | Discharge status: Home / Self Care | Payer: Medicare HMO | Source: Ambulatory Visit | Attending: Family Medicine | Admitting: Family Medicine

## 2024-03-21 ENCOUNTER — Ambulatory Visit (HOSPITAL_COMMUNITY): Payer: Self-pay | Admitting: Family Medicine

## 2024-03-21 ENCOUNTER — Encounter (HOSPITAL_COMMUNITY): Payer: Self-pay

## 2024-03-21 VITALS — BP 118/70 | HR 104 | Ht 67.0 in | Wt 280.4 lb

## 2024-03-21 DIAGNOSIS — I2510 Atherosclerotic heart disease of native coronary artery without angina pectoris: Secondary | ICD-10-CM | POA: Insufficient documentation

## 2024-03-21 DIAGNOSIS — Z6841 Body Mass Index (BMI) 40.0 and over, adult: Secondary | ICD-10-CM | POA: Diagnosis not present

## 2024-03-21 DIAGNOSIS — Z7982 Long term (current) use of aspirin: Secondary | ICD-10-CM | POA: Diagnosis not present

## 2024-03-21 DIAGNOSIS — I5032 Chronic diastolic (congestive) heart failure: Secondary | ICD-10-CM | POA: Diagnosis not present

## 2024-03-21 DIAGNOSIS — E669 Obesity, unspecified: Secondary | ICD-10-CM | POA: Insufficient documentation

## 2024-03-21 DIAGNOSIS — I252 Old myocardial infarction: Secondary | ICD-10-CM | POA: Diagnosis not present

## 2024-03-21 DIAGNOSIS — Z79899 Other long term (current) drug therapy: Secondary | ICD-10-CM | POA: Insufficient documentation

## 2024-03-21 DIAGNOSIS — I5022 Chronic systolic (congestive) heart failure: Secondary | ICD-10-CM | POA: Insufficient documentation

## 2024-03-21 LAB — BASIC METABOLIC PANEL WITH GFR
Anion gap: 11 (ref 5–15)
BUN: 30 mg/dL — ABNORMAL HIGH (ref 8–23)
CO2: 23 mmol/L (ref 22–32)
Calcium: 9.7 mg/dL (ref 8.9–10.3)
Chloride: 104 mmol/L (ref 98–111)
Creatinine, Ser: 1.08 mg/dL — ABNORMAL HIGH (ref 0.44–1.00)
GFR, Estimated: 56 mL/min — ABNORMAL LOW (ref >60)
Glucose, Bld: 171 mg/dL — ABNORMAL HIGH (ref 70–99)
Potassium: 4.8 mmol/L (ref 3.5–5.1)
Sodium: 138 mmol/L (ref 135–145)

## 2024-03-21 NOTE — Patient Instructions (Signed)
 There has been no changes to your medications.  Labs done today, your results will be available in MyChart, we will contact you for abnormal readings.  Your physician recommends that you schedule a follow-up appointment in: 6 months ( January 2026) ** PLEASE CALL THE OFFICE IN NOVEMBER TO ARRANGE YOUR FOLLOW UP APPOINTMENT.**  If you have any questions or concerns before your next appointment please send us  a message through [place] or call our office at [phone].    TO LEAVE A MESSAGE FOR THE NURSE SELECT OPTION 2, PLEASE LEAVE A MESSAGE INCLUDING: YOUR NAME DATE OF BIRTH CALL BACK NUMBER REASON FOR CALL**this is important as we prioritize the call backs  YOU WILL RECEIVE A CALL BACK THE SAME DAY AS LONG AS YOU CALL BEFORE 4:00 PM  At the [facility], you and your health needs are our priority. As part of our continuing mission to provide you with exceptional heart care, we have created designated Provider Care Teams. These Care Teams include your primary Cardiologist (physician) and Advanced Practice Providers (APPs- Physician Assistants and Nurse Practitioners) who all work together to provide you with the care you need, when you need it.   You may see any of the following providers on your designated Care Team at your next follow up: Dr Toribio Fuel Dr Ezra Shuck Dr. Ria Commander Dr. Morene Brownie Amy Lenetta, NP Caffie Shed, [place] [facility] [place], [place] Beckey Coe, NP [place] Lee, NP Ellouise Class, NP Tinnie Redman, PharmD Jaun Bash, PharmD   Please be sure to bring in all your medications bottles to every appointment.    Thank you for choosing [facility]

## 2024-04-02 ENCOUNTER — Encounter (HOSPITAL_COMMUNITY): Payer: Self-pay

## 2024-04-02 ENCOUNTER — Other Ambulatory Visit (HOSPITAL_COMMUNITY): Payer: Self-pay

## 2024-04-07 ENCOUNTER — Ambulatory Visit: Admitting: Nurse Practitioner

## 2024-04-11 ENCOUNTER — Other Ambulatory Visit: Payer: Self-pay

## 2024-04-11 ENCOUNTER — Other Ambulatory Visit (HOSPITAL_COMMUNITY): Payer: Self-pay

## 2024-04-15 ENCOUNTER — Telehealth: Payer: Self-pay

## 2024-04-15 NOTE — Telephone Encounter (Signed)
 Received request from [employer] refill reorder form for Ozempic ,faxed it to provider office to sign and date can be fax to [employer] or fax it back to [phone].

## 2024-04-17 ENCOUNTER — Other Ambulatory Visit (HOSPITAL_BASED_OUTPATIENT_CLINIC_OR_DEPARTMENT_OTHER): Payer: Self-pay

## 2024-04-17 NOTE — Telephone Encounter (Signed)
 Received provider portion on refill reorder form back from provider ,faxed it to [employer] today.

## 2024-04-24 ENCOUNTER — Other Ambulatory Visit (HOSPITAL_BASED_OUTPATIENT_CLINIC_OR_DEPARTMENT_OTHER): Payer: Self-pay

## 2024-04-24 NOTE — Telephone Encounter (Signed)
 PAP Ozempic received, pt informed

## 2024-05-05 ENCOUNTER — Other Ambulatory Visit (HOSPITAL_BASED_OUTPATIENT_CLINIC_OR_DEPARTMENT_OTHER): Payer: Self-pay

## 2024-06-06 ENCOUNTER — Telehealth: Payer: Self-pay | Admitting: Nurse Practitioner

## 2024-06-06 NOTE — Telephone Encounter (Signed)
 Pt needs renewal for her Handicap Placard, form in your folder

## 2024-06-06 NOTE — Telephone Encounter (Signed)
 PAP LANTUS  received, pt informed

## 2024-06-17 ENCOUNTER — Ambulatory Visit: Payer: Self-pay

## 2024-06-17 ENCOUNTER — Telehealth: Payer: Self-pay | Admitting: Internal Medicine

## 2024-06-17 NOTE — Telephone Encounter (Signed)
 That is fine

## 2024-06-17 NOTE — Telephone Encounter (Signed)
Appt made for 10/22 °

## 2024-06-17 NOTE — Telephone Encounter (Signed)
   Copied from CRM #[id number]. Topic: General - Other >> Jun 17, 2024  3:17 PM Terri Heath wrote: Reason for CRM: Patient states she is to come in and pick up insalin, but patient is not feeling good and will be in to pick that up once feeling better. Just wanted to let clinic know.

## 2024-06-17 NOTE — Telephone Encounter (Signed)
 FYI Only or Action Required?: FYI only for provider.  Patient was last seen in primary care on 02/25/2024 by Early, Camie BRAVO, NP.  Called Nurse Triage reporting Cough.  Symptoms began a week ago.  Interventions attempted: OTC medications: ibuprofen  and Cepachol.  Symptoms are: gradually worsening.  Triage Disposition: See PCP When Office is Open (Within 3 Days)  Patient/caregiver understands and will follow disposition?: Yes  Copied from CRM #[id number]. Topic: Clinical - Red Word Triage >> Jun 17, 2024  3:18 PM Roselie BROCKS wrote: [facility] that prompted transfer to Nurse Triage: Patient states she does not feel good at all,and  has a really bad productive cough Reason for Disposition  Cough has been present for > 3 weeks  Answer Assessment - Initial Assessment Questions Pt with a week of URI. Started out with sore throat (felt like hot poker in her throat) and has settled into a productive cough in her chest. She is using ibuprofen , Cepachol cough drops, sleeping with a heating pad intermittently to loosen up congestion.  She denies fever, CP, Dizziness. She gets labored breathing with congestion but does not feel SOB. She coughs and it eases up. Mucous still clear/white. Has had wheezing but hasn't needed her inhaler since early Monday.  Advised steam shower and humidifier, hot liquids with honey. Appt made with PCP office and ED/UC/Call back instructions given and understood.   1. ONSET: When did the cough begin?      About a week  2. SEVERITY: How bad is the cough today?      Consistent  3. SPUTUM: Describe the color of your sputum (e.g., none, dry cough; clear, white, yellow, green)     Clear white sputum 4. HEMOPTYSIS: Are you coughing up any blood? If Yes, ask: How much? (e.g., flecks, streaks, tablespoons, etc.)     Denies blood  5. DIFFICULTY BREATHING: Are you having difficulty breathing? If Yes, ask: How bad is it? (e.g., mild, moderate, severe)      Feels better  when coughing stuff up. Labored until she coughs and clears it.  6. FEVER: Do you have a fever? If Yes, ask: What is your temperature, how was it measured, and when did it start?     denies 7. CARDIAC HISTORY: Do you have any history of heart disease? (e.g., heart attack, congestive heart failure)      HTN, CHF, DM  8. LUNG HISTORY: Do you have any history of lung disease?  (e.g., pulmonary embolus, asthma, emphysema)     OSA, Asthma  -- Cpap makes OSA worse 9. PE RISK FACTORS: Do you have a history of blood clots? (or: recent major surgery, recent prolonged travel, bedridden)     denies 10. OTHER SYMPTOMS: Do you have any other symptoms? (e.g., runny nose, wheezing, chest pain)       Wheezing- inhaler a couple times Monday but has been bett 12. TRAVEL: Have you traveled out of the country in the last month? (e.g., travel history, exposures)       denies  Protocols used: Cough - Acute Productive-A-AH

## 2024-06-17 NOTE — Telephone Encounter (Signed)
 Called patient that renewal was ready for pick up

## 2024-06-18 ENCOUNTER — Ambulatory Visit: Admitting: Family Medicine

## 2024-06-24 ENCOUNTER — Ambulatory Visit: Admitting: Family Medicine

## 2024-06-24 ENCOUNTER — Other Ambulatory Visit: Payer: Self-pay | Admitting: Family Medicine

## 2024-06-24 ENCOUNTER — Other Ambulatory Visit: Payer: Self-pay | Admitting: Nurse Practitioner

## 2024-06-24 DIAGNOSIS — E1159 Type 2 diabetes mellitus with other circulatory complications: Secondary | ICD-10-CM

## 2024-06-24 DIAGNOSIS — I5022 Chronic systolic (congestive) heart failure: Secondary | ICD-10-CM

## 2024-06-24 DIAGNOSIS — L03317 Cellulitis of buttock: Secondary | ICD-10-CM | POA: Diagnosis not present

## 2024-06-24 DIAGNOSIS — S82899A Other fracture of unspecified lower leg, initial encounter for closed fracture: Secondary | ICD-10-CM | POA: Diagnosis not present

## 2024-06-24 DIAGNOSIS — L98499 Non-pressure chronic ulcer of skin of other sites with unspecified severity: Secondary | ICD-10-CM | POA: Diagnosis not present

## 2024-06-24 DIAGNOSIS — E66813 Obesity, class 3: Secondary | ICD-10-CM

## 2024-06-24 NOTE — Telephone Encounter (Signed)
 Called pt regarding scheduling with Camie for cough.  She said her husband was too busy to take her and she was feeling better.  She will wait and come in for her diabetes in November.

## 2024-06-25 ENCOUNTER — Other Ambulatory Visit (HOSPITAL_COMMUNITY): Payer: Self-pay

## 2024-06-25 ENCOUNTER — Other Ambulatory Visit: Payer: Self-pay

## 2024-06-25 MED ORDER — METFORMIN HCL 1000 MG PO TABS
1000.0000 mg | ORAL_TABLET | Freq: Two times a day (BID) | ORAL | 0 refills | Status: DC
  Filled 2024-06-25: qty 180, 90d supply, fill #0

## 2024-06-25 MED ORDER — SACUBITRIL-VALSARTAN 24-26 MG PO TABS
1.0000 | ORAL_TABLET | Freq: Two times a day (BID) | ORAL | 1 refills | Status: AC
  Filled 2024-06-25 – 2024-07-17 (×2): qty 180, 90d supply, fill #0
  Filled 2024-08-01: qty 180, 90d supply, fill #1

## 2024-06-25 MED ORDER — FUROSEMIDE 20 MG PO TABS
20.0000 mg | ORAL_TABLET | Freq: Every day | ORAL | 1 refills | Status: AC
  Filled 2024-06-25 – 2024-07-17 (×2): qty 90, 90d supply, fill #0
  Filled 2024-08-01: qty 90, 90d supply, fill #1

## 2024-06-25 MED ORDER — CARVEDILOL 6.25 MG PO TABS
6.2500 mg | ORAL_TABLET | Freq: Two times a day (BID) | ORAL | 1 refills | Status: AC
  Filled 2024-06-25: qty 180, 90d supply, fill #0

## 2024-06-25 MED ORDER — SPIRONOLACTONE 25 MG PO TABS
25.0000 mg | ORAL_TABLET | Freq: Every day | ORAL | 1 refills | Status: AC
  Filled 2024-06-25 – 2024-08-01 (×2): qty 90, 90d supply, fill #0

## 2024-07-07 ENCOUNTER — Ambulatory Visit: Payer: Self-pay | Admitting: Nurse Practitioner

## 2024-07-07 ENCOUNTER — Encounter: Payer: Self-pay | Admitting: Nurse Practitioner

## 2024-07-07 ENCOUNTER — Other Ambulatory Visit (HOSPITAL_BASED_OUTPATIENT_CLINIC_OR_DEPARTMENT_OTHER): Payer: Self-pay

## 2024-07-07 VITALS — BP 122/82 | HR 85 | Wt 284.2 lb

## 2024-07-07 DIAGNOSIS — E039 Hypothyroidism, unspecified: Secondary | ICD-10-CM

## 2024-07-07 DIAGNOSIS — E66813 Obesity, class 3: Secondary | ICD-10-CM

## 2024-07-07 DIAGNOSIS — E1159 Type 2 diabetes mellitus with other circulatory complications: Secondary | ICD-10-CM

## 2024-07-07 DIAGNOSIS — I5022 Chronic systolic (congestive) heart failure: Secondary | ICD-10-CM | POA: Diagnosis not present

## 2024-07-07 DIAGNOSIS — Z23 Encounter for immunization: Secondary | ICD-10-CM

## 2024-07-07 DIAGNOSIS — E1165 Type 2 diabetes mellitus with hyperglycemia: Secondary | ICD-10-CM

## 2024-07-07 DIAGNOSIS — G720 Drug-induced myopathy: Secondary | ICD-10-CM

## 2024-07-07 DIAGNOSIS — E1142 Type 2 diabetes mellitus with diabetic polyneuropathy: Secondary | ICD-10-CM | POA: Diagnosis not present

## 2024-07-07 DIAGNOSIS — D61818 Other pancytopenia: Secondary | ICD-10-CM

## 2024-07-07 DIAGNOSIS — G4733 Obstructive sleep apnea (adult) (pediatric): Secondary | ICD-10-CM

## 2024-07-07 DIAGNOSIS — E785 Hyperlipidemia, unspecified: Secondary | ICD-10-CM

## 2024-07-07 DIAGNOSIS — I152 Hypertension secondary to endocrine disorders: Secondary | ICD-10-CM

## 2024-07-07 DIAGNOSIS — E1169 Type 2 diabetes mellitus with other specified complication: Secondary | ICD-10-CM

## 2024-07-07 DIAGNOSIS — H66003 Acute suppurative otitis media without spontaneous rupture of ear drum, bilateral: Secondary | ICD-10-CM | POA: Insufficient documentation

## 2024-07-07 MED ORDER — AZITHROMYCIN 250 MG PO TABS
ORAL_TABLET | ORAL | 0 refills | Status: AC
  Filled 2024-07-07: qty 6, 5d supply, fill #0

## 2024-07-07 MED ORDER — LEVOTHYROXINE SODIUM 50 MCG PO TABS
50.0000 ug | ORAL_TABLET | Freq: Every day | ORAL | 1 refills | Status: AC
  Filled 2024-07-07: qty 90, 90d supply, fill #0
  Filled 2024-08-01: qty 90, 90d supply, fill #1

## 2024-07-07 NOTE — Assessment & Plan Note (Signed)
 Hyperlipidemia associated with diabetes. Current therapy: Repatha  140mg  every 14 days. Last lipids poorly controlled. Tolerating medication well. Unable to tolerate statins. Managing consistent schedule of medication.  - Monitor lipid levels annually, or more frequently if abnormal.  - Continue Repatha  at current dose and frequency - Referral for pharmacy evaluation to help with management and patient assistance, if available. - Low fat, low carb diet with daily exercise strongly encouraged.  - LDL goal <55 - F/U DM 38mo

## 2024-07-07 NOTE — Assessment & Plan Note (Signed)
 Diabetes managed with metformin  and lantus  25units daily. Last A1c was 7.9. Reports that her fasting BG are 160 on average. Will monitor the A1c and determine if adjustment is needed to Lantus  dosing. Also can consider glipizide , although I would like to avoid adding another medication, if possible.

## 2024-07-07 NOTE — Assessment & Plan Note (Signed)
 She is on medications Entresto , carvedilol , and furosemide  for heart failure management. Her ejection fraction is reported to be at 60%, which is a positive indicator of heart function. Actively engaging in physical activities such as swimming and walking, which are beneficial for her cardiovascular health. Concerns with cost of Repatha  and Entresto  due to recent financial changes. Discussed option to evaluate with our pharmacist.  - Continue current medications. - Encourage continued physical activity, including swimming and walking - I will send a referral to our pharmacist to see if she can help with any programs that may be available for medication assistance.

## 2024-07-07 NOTE — Progress Notes (Signed)
 [name], DNP, AGNP-c [facility]  [address] [place], [place] [address] [phone]  ESTABLISHED PATIENT- Chronic Health and/or Follow-Up Visit on 07/07/2024  Blood pressure 122/82, pulse 85, weight 284 lb 3.2 oz (128.9 kg).   HPI: History of Present Illness Terri Heath is a 68 year old female who presents for f/u on chronic medical conditions.  She recent had a three week stent of an upper respiratory symptoms and ear discomfort. Ear discomfort continues.   She has been experiencing upper respiratory symptoms, including a sore throat, for three weeks, which she believes she contracted from her grandchildren. During this time, she avoided going out to prevent spreading the illness. The symptoms were severe, but she has since improved and even went swimming last week.  She describes ear discomfort, specifically an itchy sensation and drainage from one ear. The drainage is sticky, and there is still fluid behind the ears, which is slightly cloudy. She initially used an over-the-counter nasal spray for a week but discontinued it as symptoms moved to her chest and throat. She found relief from her sore throat using Cepacol, consuming three boxes.  She experiences swelling in her left foot more than the right, though it is not significantly felt. She has a history of a foot fracture and describes her feet as feeling like 'marshmallow feet'. She uses a heating pad and a back massager to promote blood flow.  She is currently using 25 units of insulin  in the morning, with morning blood sugar levels around 160 mg/dL. She also takes Entresto  and Repatha . She is concerned about the cost of medications like Ozempic  and the discontinuation of patient assistance programs.  She discusses her social situation, mentioning her husband who is currently unemployed and working on projects at their country house. She expresses stress related to his financial decisions and  their impact on her. She also shares about her family gatherings and the joy of spending time with her grandchildren.  She reports numbness or tingling in her toes. Pain in her shins, which previously held most of the pain, has gone, but she still experiences discomfort in her feet.  All ROS negative with exception of what is listed above.    PHYSICAL EXAM Physical Exam Vitals and nursing note reviewed.  Constitutional:      General: She is not in acute distress.    Appearance: Normal appearance. She is obese. She is not ill-appearing.  HENT:     Head: Normocephalic.     Right Ear: A middle ear effusion is present. Tympanic membrane is bulging.     Left Ear: A middle ear effusion is present. Tympanic membrane is bulging.     Nose: Congestion present.     Mouth/Throat:     Mouth: Mucous membranes are moist.     Pharynx: Oropharynx is clear.  Eyes:     General: No scleral icterus.    Pupils: Pupils are equal, round, and reactive to light.  Cardiovascular:     Rate and Rhythm: Normal rate and regular rhythm.     Pulses: Normal pulses.     Heart sounds: Normal heart sounds.  Pulmonary:     Effort: Pulmonary effort is normal.     Breath sounds: Normal breath sounds.  Abdominal:     General: Bowel sounds are normal. There is no distension.     Palpations: Abdomen is soft.     Tenderness: There is no abdominal tenderness. There is no guarding.  Musculoskeletal:  General: Normal range of motion.     Right lower leg: No edema.     Left lower leg: No edema.  Skin:    General: Skin is warm and dry.     Capillary Refill: Capillary refill takes less than 2 seconds.  Neurological:     Mental Status: She is alert and oriented to person, place, and time.     Sensory: Sensory deficit present.     Motor: Weakness present.     Gait: Gait abnormal.  Psychiatric:        Mood and Affect: Mood normal.        Behavior: Behavior normal.     PLAN Problem List Items Addressed This  Visit     Poorly controlled type 2 diabetes mellitus with circulatory disorder (HCC) - Primary   Diabetes managed with metformin  and lantus  25units daily. Last A1c was 7.9. Reports that her fasting BG are 160 on average. Will monitor the A1c and determine if adjustment is needed to Lantus  dosing. Also can consider glipizide , although I would like to avoid adding another medication, if possible.       Relevant Orders   Microalbumin/Creatinine Ratio, Urine   CBC with Differential/Platelet   CMP14+EGFR   Hemoglobin A1c   AMB Referral [facility] Care Management   Hyperlipidemia associated with type 2 diabetes mellitus (HCC)   Hyperlipidemia associated with diabetes. Current therapy: Repatha  140mg  every 14 days. Last lipids poorly controlled. Tolerating medication well. Unable to tolerate statins. Managing consistent schedule of medication.  - Monitor lipid levels annually, or more frequently if abnormal.  - Continue Repatha  at current dose and frequency - Referral for pharmacy evaluation to help with management and patient assistance, if available. - Low fat, low carb diet with daily exercise strongly encouraged.  - LDL goal <55 - F/U DM 19mo       Relevant Orders   Microalbumin/Creatinine Ratio, Urine   CBC with Differential/Platelet   CMP14+EGFR   Hemoglobin A1c   AMB Referral [facility] Care Management   Obesity, Class III, BMI 40-49.9 (morbid obesity) (HCC)   Working on diet and exercise with improved stamina with exercise. Continue regimen.       Relevant Orders   Microalbumin/Creatinine Ratio, Urine   CBC with Differential/Platelet   CMP14+EGFR   Hemoglobin A1c   AMB Referral [facility] Care Management   Hypertension associated with type 2 diabetes mellitus (HCC)   Hypertension associated with diabetes. Current therapy: entresto  and carvedilol . Prescribed daily. Current BP is stable. is tolerating medication well. is managing schedule of medication. Lab monitoring is due and ordered today. -  Monitor BP at home as directed or if you begin to have symptoms. If your blood pressure readings are consistently higher than 135/85 please let us  know.  - Low fat, low card diet with daily exercise strongly encouraged.  - Continue medication management daily.  - F/U DM 6 mo       Relevant Orders   Microalbumin/Creatinine Ratio, Urine   CBC with Differential/Platelet   CMP14+EGFR   Hemoglobin A1c   AMB Referral [facility] Care Management   Chronic systolic CHF (congestive heart failure), NYHA class 3 (HCC)   She is on medications Entresto , carvedilol , and furosemide  for heart failure management. Her ejection fraction is reported to be at 60%, which is a positive indicator of heart function. Actively engaging in physical activities such as swimming and walking, which are beneficial for her cardiovascular health. Concerns with cost of Repatha  and Entresto  due to  recent financial changes. Discussed option to evaluate with our pharmacist.  - Continue current medications. - Encourage continued physical activity, including swimming and walking - I will send a referral to our pharmacist to see if she can help with any programs that may be available for medication assistance.       Relevant Orders   Microalbumin/Creatinine Ratio, Urine   CBC with Differential/Platelet   CMP14+EGFR   Hemoglobin A1c   AMB Referral [facility] Care Management   Diabetic peripheral neuropathy associated with type 2 diabetes mellitus (HCC)   A1c not well controlled with ongoing neuropathic pain noted in the feet. No worsening symptoms is reassuring. Foot exam normal without edema. Recommend daily evaluation of feet and close monitoring for changes given the numbness.  - Take good care of your feet by wearing shoes that fit well and do not rub at all times.  - Check your feet every day for blisters, redness, or signs of infection - Wash and dry the feet very well every day and apply lotion to the feet, but avoid in between the  toes as this can cause rubbing.        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 WIll monitor      OSA (obstructive sleep apnea)   Chronic. Managed with CPAP. No alarm symptoms present. Will monitor.       Relevant Orders   Microalbumin/Creatinine Ratio, Urine   CBC with Differential/Platelet   CMP14+EGFR   Hemoglobin A1c   AMB Referral [facility]I Care Management   Acquired hypothyroidism   Chronic. Stable. Currently managed with levothyroxine . No symptoms are present at this time. Labs pending today. Plan: - Refills have been sent to the pharmacy. If the dosing needs to be changed I will let you know.       Relevant Medications   levothyroxine  (SYNTHROID ) 50 MCG tablet   Other Relevant Orders   TSH + free T4   Non-recurrent acute suppurative otitis media of both ears without spontaneous rupture of tympanic membranes   Recent upper respiratory infection with sore throat and ear drainage. Fluid behind ears with cloudy appearance, indicating residual effusion. Symptoms improving, but potential for bacterial infection if symptoms worsen. - Prescribed antibiotic for potential bacterial infection if symptoms worsen.      Relevant Medications   azithromycin  (ZITHROMAX ) 250 MG tablet   Other Visit Diagnoses       Need for influenza vaccination       Relevant Orders   Flu vaccine HIGH DOSE PF(Fluzone Trivalent) (Completed)        Return in about 6 months (around 01/04/2025) for Med Management 30.  Terri Bena Kobel, DNP, AGNP-c Time: 45 minutes, >50% spent counseling, care coordination, chart review, and documentation.  SABRAtime

## 2024-07-07 NOTE — Addendum Note (Signed)
 Addended by: Amaree Leeper, CAMIE E on: 07/07/2024 04:54 PM   Modules accepted: Level of Service

## 2024-07-07 NOTE — Assessment & Plan Note (Signed)
Chronic. Stable. Currently managed with levothyroxine. No symptoms are present at this time. Labs pending today. Plan: - Refills have been sent to the pharmacy. If the dosing needs to be changed I will let you know.

## 2024-07-07 NOTE — Assessment & Plan Note (Signed)
 Chronic. Managed with CPAP. No alarm symptoms present. Will monitor.

## 2024-07-07 NOTE — Assessment & Plan Note (Signed)
 A1c not well controlled with ongoing neuropathic pain noted in the feet. No worsening symptoms is reassuring. Foot exam normal without edema. Recommend daily evaluation of feet and close monitoring for changes given the numbness.  - Take good care of your feet by wearing shoes that fit well and do not rub at all times.  - Check your feet every day for blisters, redness, or signs of infection - Wash and dry the feet very well every day and apply lotion to the feet, but avoid in between the toes as this can cause rubbing.

## 2024-07-07 NOTE — Assessment & Plan Note (Signed)
 Suspect related to MGUS. WIll monitor

## 2024-07-07 NOTE — Assessment & Plan Note (Signed)
 Working on diet and exercise with improved stamina with exercise. Continue regimen.

## 2024-07-07 NOTE — Assessment & Plan Note (Signed)
 Recent upper respiratory infection with sore throat and ear drainage. Fluid behind ears with cloudy appearance, indicating residual effusion. Symptoms improving, but potential for bacterial infection if symptoms worsen. - Prescribed antibiotic for potential bacterial infection if symptoms worsen.

## 2024-07-07 NOTE — Assessment & Plan Note (Signed)
 Hypertension associated with diabetes. Current therapy: entresto  and carvedilol . Prescribed daily. Current BP is stable. is tolerating medication well. is managing schedule of medication. Lab monitoring is due and ordered today. - Monitor BP at home as directed or if you begin to have symptoms. If your blood pressure readings are consistently higher than 135/85 please let us  know.  - Low fat, low card diet with daily exercise strongly encouraged.  - Continue medication management daily.  - F/U DM 6 mo

## 2024-07-07 NOTE — Patient Instructions (Signed)
 Have a Happy Thanksgiving and Merry Christmas!!

## 2024-07-08 LAB — CBC WITH DIFFERENTIAL/PLATELET
Basophils Absolute: 0.1 x10E3/uL (ref 0.0–0.2)
Basos: 1 %
EOS (ABSOLUTE): 0.2 x10E3/uL (ref 0.0–0.4)
Eos: 3 %
Hematocrit: 38.3 % (ref 34.0–46.6)
Hemoglobin: 12.2 g/dL (ref 11.1–15.9)
Immature Grans (Abs): 0 x10E3/uL (ref 0.0–0.1)
Immature Granulocytes: 0 %
Lymphocytes Absolute: 1.8 x10E3/uL (ref 0.7–3.1)
Lymphs: 29 %
MCH: 28.1 pg (ref 26.6–33.0)
MCHC: 31.9 g/dL (ref 31.5–35.7)
MCV: 88 fL (ref 79–97)
Monocytes Absolute: 0.5 x10E3/uL (ref 0.1–0.9)
Monocytes: 8 %
Neutrophils Absolute: 3.6 x10E3/uL (ref 1.4–7.0)
Neutrophils: 59 %
Platelets: 261 x10E3/uL (ref 150–450)
RBC: 4.34 x10E6/uL (ref 3.77–5.28)
RDW: 13.2 % (ref 11.7–15.4)
WBC: 6.1 x10E3/uL (ref 3.4–10.8)

## 2024-07-08 LAB — CMP14+EGFR
ALT: 14 IU/L (ref 0–32)
AST: 14 IU/L (ref 0–40)
Albumin: 3.8 g/dL — AB (ref 3.9–4.9)
Alkaline Phosphatase: 89 IU/L (ref 49–135)
BUN/Creatinine Ratio: 24 (ref 12–28)
BUN: 26 mg/dL (ref 8–27)
Bilirubin Total: <0.2 mg/dL (ref 0.0–1.2)
CO2: 20 mmol/L (ref 20–29)
Calcium: 9.8 mg/dL (ref 8.7–10.3)
Chloride: 99 mmol/L (ref 96–106)
Creatinine, Ser: 1.09 mg/dL — AB (ref 0.57–1.00)
Globulin, Total: 3.8 g/dL (ref 1.5–4.5)
Glucose: 211 mg/dL — AB (ref 70–99)
Potassium: 5.3 mmol/L — AB (ref 3.5–5.2)
Sodium: 135 mmol/L (ref 134–144)
Total Protein: 7.6 g/dL (ref 6.0–8.5)
eGFR: 56 mL/min/1.73 — AB (ref >59)

## 2024-07-08 LAB — MICROALBUMIN / CREATININE URINE RATIO
Creatinine, Urine: 99.4 mg/dL
Microalb/Creat Ratio: <3 mg/g{creat} (ref 0–29)
Microalbumin, Urine: <3 ug/mL

## 2024-07-08 LAB — TSH+FREE T4
Free T4: 1.29 ng/dL (ref 0.82–1.77)
TSH: 2.6 u[IU]/mL (ref 0.450–4.500)

## 2024-07-08 LAB — HEMOGLOBIN A1C
Est. average glucose Bld gHb Est-mCnc: 197 mg/dL
Hgb A1c MFr Bld: 8.5 % — ABNORMAL HIGH (ref 4.8–5.6)

## 2024-07-10 ENCOUNTER — Other Ambulatory Visit (HOSPITAL_COMMUNITY): Payer: Self-pay

## 2024-07-10 ENCOUNTER — Ambulatory Visit: Payer: Self-pay | Admitting: Nurse Practitioner

## 2024-07-10 ENCOUNTER — Telehealth: Payer: Self-pay

## 2024-07-10 NOTE — Telephone Encounter (Signed)
 Received provider portion refill reorder form faxed to [employer] today

## 2024-07-10 NOTE — Telephone Encounter (Signed)
 Received a refill re-order from [employer] (Ozempic ) filled and faxed to provider offcie to sign and date.

## 2024-07-10 NOTE — Telephone Encounter (Signed)
 DISREGARD DUPLICATED

## 2024-07-17 ENCOUNTER — Other Ambulatory Visit (HOSPITAL_COMMUNITY): Payer: Self-pay

## 2024-07-17 ENCOUNTER — Other Ambulatory Visit: Payer: Self-pay

## 2024-08-01 ENCOUNTER — Other Ambulatory Visit (HOSPITAL_BASED_OUTPATIENT_CLINIC_OR_DEPARTMENT_OTHER): Payer: Self-pay

## 2024-08-04 ENCOUNTER — Other Ambulatory Visit (HOSPITAL_BASED_OUTPATIENT_CLINIC_OR_DEPARTMENT_OTHER): Payer: Self-pay

## 2024-08-04 ENCOUNTER — Other Ambulatory Visit: Payer: Self-pay

## 2024-08-05 ENCOUNTER — Telehealth: Payer: Self-pay

## 2024-08-05 NOTE — Progress Notes (Signed)
 Care Guide Pharmacy Note  08/05/2024 Name: Terri Heath MRN: [id number] DOB: May 26, 1956  Referred By: Oris Camie BRAVO, NP Reason for referral: Complex Care Management (Outreach to schedule referral with PharmD)   Terri Heath is a 68 y.o. year old female who is a primary care patient of Early, Camie BRAVO, NP.  Terri Heath was referred to the pharmacist for assistance related to: DMII  Successful contact was made with the patient to discuss pharmacy services including being ready for the pharmacist to call at least 5 minutes before the scheduled appointment time and to have medication bottles and any blood pressure readings ready for review. The patient agreed to meet with the pharmacist via telephone visit on (date/time). 09/11/24  .Debbe Fuse [facility], [facility] Guide  Direct Dial: [phone]  Fax [phone]

## 2024-08-06 ENCOUNTER — Other Ambulatory Visit (HOSPITAL_BASED_OUTPATIENT_CLINIC_OR_DEPARTMENT_OTHER): Payer: Self-pay

## 2024-08-08 ENCOUNTER — Other Ambulatory Visit (HOSPITAL_BASED_OUTPATIENT_CLINIC_OR_DEPARTMENT_OTHER): Payer: Self-pay

## 2024-08-15 ENCOUNTER — Telehealth: Payer: Self-pay

## 2024-08-15 NOTE — Telephone Encounter (Signed)
 Gave pt a call pt is due for re-enrollment on [employer](Lantus ) left a HIPAA VM. Will mail out pap.

## 2024-09-04 NOTE — Telephone Encounter (Signed)
 Received pt portion of PAP [employer](Lantus ), waiting on provider portion faxed provider portion, pt did not send proof of income, left a HIPAA VM.

## 2024-09-05 NOTE — Telephone Encounter (Signed)
 Pt call back said she does not needed to send proof of income to submit pap with out if company needs it they will get in touch with pt,per pt. Still waiting on provider portion.

## 2024-09-08 NOTE — Telephone Encounter (Signed)
 Received provider portion on [employer] back from provider office faxed to [employer] today.

## 2024-09-11 ENCOUNTER — Other Ambulatory Visit (HOSPITAL_BASED_OUTPATIENT_CLINIC_OR_DEPARTMENT_OTHER): Payer: Self-pay

## 2024-09-11 ENCOUNTER — Other Ambulatory Visit

## 2024-09-11 DIAGNOSIS — E1159 Type 2 diabetes mellitus with other circulatory complications: Secondary | ICD-10-CM

## 2024-09-11 DIAGNOSIS — E1165 Type 2 diabetes mellitus with hyperglycemia: Secondary | ICD-10-CM

## 2024-09-11 MED ORDER — ACCU-CHEK SOFTCLIX LANCETS MISC
3 refills | Status: AC
  Filled 2024-09-11: qty 100, 100d supply, fill #0

## 2024-09-11 MED ORDER — ACCU-CHEK GUIDE TEST VI STRP
ORAL_STRIP | 3 refills | Status: AC
  Filled 2024-09-11: qty 100, 100d supply, fill #0

## 2024-09-11 MED ORDER — ACCU-CHEK GUIDE W/DEVICE KIT
PACK | 0 refills | Status: AC
  Filled 2024-09-11: qty 1, 90d supply, fill #0

## 2024-09-11 NOTE — Progress Notes (Signed)
 "  09/11/2024 Name: Terri Heath MRN: [id number] DOB: March 22, 1956  Chief Complaint  Patient presents with   Medication Management   Diabetes    Terri Heath is a 69 y.o. year old female who presented for a telephone visit.   They were referred to the pharmacist by their PCP for assistance in managing diabetes and medication access.    Subjective:  Care Team: Primary Care Provider: Oris Camie BRAVO, NP ; Next Scheduled Visit: 01/12/25  Medication Access/Adherence  Current Pharmacy:  [facility] [address] [place] [address] Phone: [phone] Fax: [phone]   Patient reports affordability concerns with their medications: Yes  - GLP-1, repatha , entresto  Patient reports access/transportation concerns to their pharmacy: No  Patient reports adherence concerns with their medications:  No     Diabetes:  Current medications: Lantus , Metformin , Ozempic  Medications tried in the past: Jardiance  (yeast infections), Byetta (GI), Trulicity  (cost)  Current glucose readings: Needs supplies to check BG and a new meter  Has been micro-dosing her Ozempic  to make it last now that Novo PAP has ended   Patient denies hypoglycemic s/sx including dizziness, shakiness, sweating. Patient denies hyperglycemic symptoms including polyuria, polydipsia, polyphagia, nocturia, neuropathy, blurred vision.   Current medication access support:   Macrovascular and Microvascular Risk Reduction:  Statin? no; patient previously intolerant to statin therapy; on Repatha  ACEi/ARB? yes (entresto ) Last urinary albumin/creatinine ratio:  Lab Results  Component Value Date   MICRALBCREAT <3 07/07/2024   MICRALBCREAT 14.3 05/03/2023   MICRALBCREAT <6 08/14/2022   MICRALBCREAT <8 09/16/2020   MICRALBCREAT <3 09/22/2019   MICRALBCREAT 3.8 10/24/2012   Last eye exam:  Lab Results  Component Value Date   HMDIABEYEEXA Retinopathy (A)  12/11/2022   Last foot exam: 07/07/2024 Tobacco Use:  Tobacco Use: Medium Risk (07/07/2024)   Patient History    Smoking Tobacco Use: Former    Smokeless Tobacco Use: Never    Passive Exposure: Not on file     Objective:  Lab Results  Component Value Date   HGBA1C 8.5 (H) 07/07/2024    Lab Results  Component Value Date   CREATININE 1.09 (H) 07/07/2024   BUN 26 07/07/2024   NA 135 07/07/2024   K 5.3 (H) 07/07/2024   CL 99 07/07/2024   CO2 20 07/07/2024    Lab Results  Component Value Date   CHOL 254 (H) 09/24/2023   HDL 45 09/24/2023   LDLCALC 173 (H) 09/24/2023   TRIG 182 (H) 09/24/2023   CHOLHDL 5.6 09/24/2023    Medications Reviewed Today     Reviewed by Lionell Jon DEL, RPH (Pharmacist) on 09/11/24 at 1213  Med List Status: <None>   Medication Order Taking? Sig Documenting Provider Last Dose Status Informant  acetaminophen  (TYLENOL ) 500 MG tablet [id number] Yes Take 500-1,000 mg by mouth every 6 (six) hours as needed for mild pain or moderate pain. [provider]  Active Self, Pharmacy Records           Med Note BEVERLEE, LUCIENNE JULIANNA Schaumann Jan 17, 2024  2:19 PM) Took one this am  albuterol  (PROAIR  HFA) 108 (90 Base) MCG/ACT inhaler [id number] Yes Inhale 2 puffs into the lungs every 6 (six) hours as needed for wheezing Early, Camie BRAVO, NP  Active            Med Note BEVERLEE, LUCIENNE JULIANNA Schaumann Jan 17, 2024  2:19 PM) As needed  aspirin  81 MG chewable tablet [id number]  Yes Chew 1 tablet (81 mg total) by mouth daily. Rolan Ezra RAMAN, MD  Active Self, Pharmacy Records  blood glucose meter kit and supplies [id number]  Use up to four times daily as directed. (FOR ICD-10 E10.9, E11.9). Early, Sara E, NP  Active Self, Pharmacy Records  carvedilol  (COREG ) 6.25 MG tablet [id number] Yes Take 1 tablet (6.25 mg total) by mouth 2 (two) times daily. MD requests appoinment for further refills. Early, Sara E, NP  Active   cholecalciferol (VITAMIN D3) 25 MCG (1000 UNIT) tablet  [id number] Yes Take 1,000 Units by mouth daily. [provider]  Active   Cyanocobalamin (VITAMIN B 12) 500 MCG TABS [id number]  Take 500 mcg by mouth daily at 12 noon. [provider]  Active   diclofenac  Sodium (VOLTAREN ) 1 % GEL [id number] Yes Apply 2g to the affected area(s) topically as needed. Early, Sara E, NP  Active   Evolocumab  (REPATHA  SURECLICK) 140 MG/ML EMMANUEL [id number] Yes Inject 140 mg into the skin every 14 (fourteen) days. Rolan Ezra RAMAN, MD  Active   furosemide  (LASIX ) 20 MG tablet [id number] Yes Take 1 tablet (20 mg total) by mouth daily. Oris Camie BRAVO, NP  Active   glucose blood test strip [id number]  Use to check blood sugar up to 4 times daily Early, Sara E, NP  Active     Discontinued 10/26/20 1434   insulin  glargine (LANTUS  SOLOSTAR) 100 UNIT/ML Solostar Pen [id number]  Inject 25-40 Units into the skin daily. Early, Sara E, NP  Active            Med Note (COMER, ADAM D   Tue Dec 04, 2023  2:16 PM) 25 units daily  Insulin  Pen Needle (BD PEN NEEDLE NANO 2ND GEN) 32G X 4 MM MISC [id number]  Use as directed daily. Early, Sara E, NP  Active   Krill Oil 1000 MG CAPS [id number]  Take 1,000 mg by mouth 2 (two) times daily. [provider]  Active   levothyroxine  (SYNTHROID ) 50 MCG tablet [id number]  Take 1 tablet (50 mcg total) by mouth daily, before a meal. Early, Sara E, NP  Active   lidocaine  (LIDODERM ) 5 % [id number]  Place 1 patch onto the shoulder daily. Remove & Discard patch within 12 hours or as directed by your provider. Early, Sara E, NP  Active   MAGNESIUM  GLYCINATE PO [id number]  Take 1 tablet by mouth daily. [provider]  Active   metFORMIN  (GLUCOPHAGE ) 1000 MG tablet [id number]  Take 1 tablet (1,000 mg total) by mouth 2 (two) times daily with a meal. Early, Sara E, NP  Active   Multiple Vitamin (MULITIVITAMIN WITH MINERALS) TABS [id number]  Take 1 tablet by mouth daily with breakfast. [provider]  Active Self, Pharmacy Records   naproxen  sodium (ALEVE ) 220 MG tablet [id number]  Take 440 mg by mouth daily as needed (pain). [provider]  Active Self, Pharmacy Records           Med Note BEVERLEE, LUCIENNE JULIANNA Schaumann Jan 17, 2024  2:23 PM) As needed  Aisha Pastor Lancets 33G MISC [id number]  Use to check blood sugars 4 (four) times daily. Early, Sara E, NP  Active   sacubitril -valsartan  (ENTRESTO ) 24-26 MG [id number]  Take 1 tablet by mouth 2 (two) times daily. Early, Sara E, NP  Active   spironolactone  (ALDACTONE ) 25 MG tablet [id number]  Take 1 tablet (25 mg total) by mouth daily. Early, Sara E, NP  Active   triamcinolone   ointment (KENALOG ) 0.1 % [id number]  Apply 1 application topically two times daily to affected area(s) as needed, sparing use to avoid whitening/thinning skin Early, Camie BRAVO, NP  Active Self, Pharmacy Records           Med Note BEVERLEE, LUCIENNE JULIANNA Schaumann Jan 17, 2024  2:22 PM) As needed  Turmeric 12-998 MG CAPS [id number]  Take 1,000 mg by mouth daily at 12 noon. [provider]  Active               Assessment/Plan:   Diabetes: - Currently uncontrolled; goal A1c <7%. Cardiorenal risk reduction is optimized.. Blood pressure is at goal <130/80. LDL is not at goal (need updated lipid panel). -Re-enrolled patient in [employer] for entresto  and repatha , providing card info to pharmacy. Active through 08/11/25. -Should qualify for Trulicity  1.5mg  PAP (with plan to titrate up for BG control as tolerated), patient would like application in mail, will coordinate with CPhT.   Follow Up Plan: 4 weeks  Jon VEAR Lindau, PharmD Clinical Pharmacist [phone]   "

## 2024-09-12 ENCOUNTER — Other Ambulatory Visit (HOSPITAL_COMMUNITY): Payer: Self-pay

## 2024-09-15 ENCOUNTER — Other Ambulatory Visit (HOSPITAL_COMMUNITY): Payer: Self-pay

## 2024-09-15 ENCOUNTER — Other Ambulatory Visit: Payer: Self-pay | Admitting: Nurse Practitioner

## 2024-09-15 ENCOUNTER — Telehealth: Payer: Self-pay

## 2024-09-15 DIAGNOSIS — E1159 Type 2 diabetes mellitus with other circulatory complications: Secondary | ICD-10-CM

## 2024-09-15 DIAGNOSIS — E66813 Obesity, class 3: Secondary | ICD-10-CM

## 2024-09-15 MED ORDER — METFORMIN HCL 1000 MG PO TABS
1000.0000 mg | ORAL_TABLET | Freq: Two times a day (BID) | ORAL | 0 refills | Status: AC
  Filled 2024-09-23: qty 180, 90d supply, fill #0

## 2024-09-15 NOTE — Telephone Encounter (Signed)
 Filled and faxed provider portion [employer] (Trulicity ) mail out pt portion.

## 2024-09-22 NOTE — Telephone Encounter (Signed)
 Received provider portion [employer] (Trulicity ) from provider office waiting on pt portion.

## 2024-09-23 ENCOUNTER — Other Ambulatory Visit (HOSPITAL_COMMUNITY): Payer: Self-pay

## 2024-09-25 ENCOUNTER — Other Ambulatory Visit (HOSPITAL_COMMUNITY): Payer: Self-pay

## 2024-10-01 ENCOUNTER — Other Ambulatory Visit (HOSPITAL_COMMUNITY): Payer: Self-pay

## 2024-10-02 ENCOUNTER — Other Ambulatory Visit (HOSPITAL_COMMUNITY): Payer: Self-pay

## 2024-10-03 ENCOUNTER — Other Ambulatory Visit: Payer: Self-pay

## 2024-10-09 ENCOUNTER — Other Ambulatory Visit

## 2024-11-05 ENCOUNTER — Ambulatory Visit (HOSPITAL_COMMUNITY): Admitting: Cardiology

## 2024-11-06 ENCOUNTER — Other Ambulatory Visit

## 2025-01-12 ENCOUNTER — Ambulatory Visit: Admitting: Nurse Practitioner

## 2025-01-20 ENCOUNTER — Ambulatory Visit: Payer: Self-pay
